# Patient Record
Sex: Male | Born: 1970 | Race: White | Hispanic: No | Marital: Married | Smoking: Never smoker
Health system: Southern US, Community
[De-identification: ages and names within clinical notes are randomized; demographics above are authoritative.]

## PROBLEM LIST (undated history)

## (undated) DIAGNOSIS — E039 Hypothyroidism, unspecified: Secondary | ICD-10-CM

## (undated) DIAGNOSIS — K219 Gastro-esophageal reflux disease without esophagitis: Secondary | ICD-10-CM

## (undated) DIAGNOSIS — F319 Bipolar disorder, unspecified: Secondary | ICD-10-CM

## (undated) DIAGNOSIS — F419 Anxiety disorder, unspecified: Secondary | ICD-10-CM

## (undated) DIAGNOSIS — S0990XA Unspecified injury of head, initial encounter: Secondary | ICD-10-CM

## (undated) DIAGNOSIS — Z9884 Bariatric surgery status: Secondary | ICD-10-CM

## (undated) DIAGNOSIS — G4730 Sleep apnea, unspecified: Secondary | ICD-10-CM

## (undated) DIAGNOSIS — F32A Depression, unspecified: Secondary | ICD-10-CM

## (undated) DIAGNOSIS — F329 Major depressive disorder, single episode, unspecified: Secondary | ICD-10-CM

## (undated) DIAGNOSIS — G5600 Carpal tunnel syndrome, unspecified upper limb: Secondary | ICD-10-CM

## (undated) DIAGNOSIS — E119 Type 2 diabetes mellitus without complications: Secondary | ICD-10-CM

## (undated) HISTORY — PX: CHOLECYSTECTOMY: SHX55

## (undated) HISTORY — DX: Bariatric surgery status: Z98.84

## (undated) HISTORY — PX: COLONOSCOPY: SHX174

## (undated) HISTORY — DX: Unspecified injury of head, initial encounter: S09.90XA

## (undated) HISTORY — PX: ANAL FISSURE REPAIR: SHX2312

## (undated) HISTORY — PX: UPPER GASTROINTESTINAL ENDOSCOPY: SHX188

## (undated) HISTORY — PX: BARIATRIC SURGERY: SHX1103

---

## 1998-11-22 ENCOUNTER — Ambulatory Visit (HOSPITAL_COMMUNITY): Admission: RE | Admit: 1998-11-22 | Discharge: 1998-11-22 | Payer: Self-pay | Admitting: Gastroenterology

## 1999-01-25 ENCOUNTER — Ambulatory Visit (HOSPITAL_COMMUNITY): Admission: RE | Admit: 1999-01-25 | Discharge: 1999-01-25 | Payer: Self-pay | Admitting: General Surgery

## 1999-05-04 ENCOUNTER — Emergency Department (HOSPITAL_COMMUNITY): Admission: EM | Admit: 1999-05-04 | Discharge: 1999-05-05 | Payer: Self-pay | Admitting: Emergency Medicine

## 1999-05-14 ENCOUNTER — Encounter: Admission: RE | Admit: 1999-05-14 | Discharge: 1999-05-14 | Payer: Self-pay | Admitting: *Deleted

## 2008-04-28 ENCOUNTER — Emergency Department (HOSPITAL_COMMUNITY): Admission: EM | Admit: 2008-04-28 | Discharge: 2008-04-29 | Payer: Self-pay | Admitting: Emergency Medicine

## 2008-04-29 ENCOUNTER — Emergency Department (HOSPITAL_COMMUNITY): Admission: EM | Admit: 2008-04-29 | Discharge: 2008-04-30 | Payer: Self-pay | Admitting: Emergency Medicine

## 2008-04-29 ENCOUNTER — Ambulatory Visit (HOSPITAL_COMMUNITY): Admission: RE | Admit: 2008-04-29 | Discharge: 2008-04-29 | Payer: Self-pay | Admitting: Neurology

## 2014-12-16 DIAGNOSIS — Z9884 Bariatric surgery status: Secondary | ICD-10-CM

## 2014-12-16 HISTORY — DX: Bariatric surgery status: Z98.84

## 2015-04-24 ENCOUNTER — Other Ambulatory Visit: Payer: Self-pay | Admitting: Orthopedic Surgery

## 2015-05-05 ENCOUNTER — Encounter (HOSPITAL_BASED_OUTPATIENT_CLINIC_OR_DEPARTMENT_OTHER): Payer: Self-pay | Admitting: *Deleted

## 2015-05-05 NOTE — Progress Notes (Signed)
Patient will come in for BMET and EKG, also anesthesia consult due to BMI 48. Will bring CPAP machine dos

## 2015-05-08 ENCOUNTER — Encounter (HOSPITAL_BASED_OUTPATIENT_CLINIC_OR_DEPARTMENT_OTHER)
Admission: RE | Admit: 2015-05-08 | Discharge: 2015-05-08 | Disposition: A | Discharge status: Home / Self Care | Payer: 59 | Source: Ambulatory Visit | Attending: Orthopedic Surgery | Admitting: Orthopedic Surgery

## 2015-05-08 DIAGNOSIS — F419 Anxiety disorder, unspecified: Secondary | ICD-10-CM | POA: Diagnosis not present

## 2015-05-08 DIAGNOSIS — F319 Bipolar disorder, unspecified: Secondary | ICD-10-CM | POA: Diagnosis not present

## 2015-05-08 DIAGNOSIS — G4730 Sleep apnea, unspecified: Secondary | ICD-10-CM | POA: Diagnosis not present

## 2015-05-08 DIAGNOSIS — Z79899 Other long term (current) drug therapy: Secondary | ICD-10-CM | POA: Diagnosis not present

## 2015-05-08 DIAGNOSIS — G5602 Carpal tunnel syndrome, left upper limb: Secondary | ICD-10-CM | POA: Diagnosis not present

## 2015-05-08 DIAGNOSIS — M549 Dorsalgia, unspecified: Secondary | ICD-10-CM | POA: Diagnosis not present

## 2015-05-08 DIAGNOSIS — K219 Gastro-esophageal reflux disease without esophagitis: Secondary | ICD-10-CM | POA: Diagnosis not present

## 2015-05-08 DIAGNOSIS — M79641 Pain in right hand: Secondary | ICD-10-CM | POA: Diagnosis present

## 2015-05-08 DIAGNOSIS — E039 Hypothyroidism, unspecified: Secondary | ICD-10-CM | POA: Diagnosis not present

## 2015-05-08 DIAGNOSIS — Z791 Long term (current) use of non-steroidal anti-inflammatories (NSAID): Secondary | ICD-10-CM | POA: Diagnosis not present

## 2015-05-08 DIAGNOSIS — E119 Type 2 diabetes mellitus without complications: Secondary | ICD-10-CM | POA: Diagnosis not present

## 2015-05-08 DIAGNOSIS — M79642 Pain in left hand: Secondary | ICD-10-CM | POA: Diagnosis present

## 2015-05-08 DIAGNOSIS — M109 Gout, unspecified: Secondary | ICD-10-CM | POA: Diagnosis not present

## 2015-05-08 DIAGNOSIS — G5601 Carpal tunnel syndrome, right upper limb: Secondary | ICD-10-CM | POA: Diagnosis not present

## 2015-05-08 DIAGNOSIS — Z9989 Dependence on other enabling machines and devices: Secondary | ICD-10-CM | POA: Diagnosis not present

## 2015-05-08 DIAGNOSIS — Z6841 Body Mass Index (BMI) 40.0 and over, adult: Secondary | ICD-10-CM | POA: Diagnosis not present

## 2015-05-08 DIAGNOSIS — Z794 Long term (current) use of insulin: Secondary | ICD-10-CM | POA: Diagnosis not present

## 2015-05-08 DIAGNOSIS — R200 Anesthesia of skin: Secondary | ICD-10-CM | POA: Diagnosis present

## 2015-05-08 LAB — BASIC METABOLIC PANEL
Anion gap: 8 (ref 5–15)
BUN: 15 mg/dL (ref 6–20)
CO2: 30 mmol/L (ref 22–32)
Calcium: 9.3 mg/dL (ref 8.9–10.3)
Chloride: 102 mmol/L (ref 101–111)
Creatinine, Ser: 0.89 mg/dL (ref 0.61–1.24)
GFR calc Af Amer: >60 mL/min (ref >60)
GFR calc non Af Amer: >60 mL/min (ref >60)
Glucose, Bld: 145 mg/dL — ABNORMAL HIGH (ref 65–99)
Potassium: 4.6 mmol/L (ref 3.5–5.1)
Sodium: 140 mmol/L (ref 135–145)

## 2015-05-08 NOTE — Progress Notes (Signed)
BMP and EKG done, Pt seen by Dr. Oletta Lamas for anesthesia consult.  [place] for surgery tomorrow.

## 2015-05-09 ENCOUNTER — Encounter (HOSPITAL_BASED_OUTPATIENT_CLINIC_OR_DEPARTMENT_OTHER): Payer: Self-pay | Admitting: Certified Registered"

## 2015-05-09 ENCOUNTER — Ambulatory Visit (HOSPITAL_BASED_OUTPATIENT_CLINIC_OR_DEPARTMENT_OTHER)
Admission: RE | Admit: 2015-05-09 | Discharge: 2015-05-09 | Disposition: A | Discharge status: Home / Self Care | Payer: 59 | Source: Ambulatory Visit | Attending: Orthopedic Surgery | Admitting: Orthopedic Surgery

## 2015-05-09 ENCOUNTER — Ambulatory Visit (HOSPITAL_BASED_OUTPATIENT_CLINIC_OR_DEPARTMENT_OTHER): Payer: 59 | Admitting: Certified Registered"

## 2015-05-09 ENCOUNTER — Encounter (HOSPITAL_BASED_OUTPATIENT_CLINIC_OR_DEPARTMENT_OTHER)
Admission: RE | Disposition: A | Discharge status: Home / Self Care | Payer: Self-pay | Source: Ambulatory Visit | Attending: Orthopedic Surgery

## 2015-05-09 DIAGNOSIS — F319 Bipolar disorder, unspecified: Secondary | ICD-10-CM | POA: Insufficient documentation

## 2015-05-09 DIAGNOSIS — G5601 Carpal tunnel syndrome, right upper limb: Secondary | ICD-10-CM | POA: Insufficient documentation

## 2015-05-09 DIAGNOSIS — Z9989 Dependence on other enabling machines and devices: Secondary | ICD-10-CM | POA: Insufficient documentation

## 2015-05-09 DIAGNOSIS — Z794 Long term (current) use of insulin: Secondary | ICD-10-CM | POA: Insufficient documentation

## 2015-05-09 DIAGNOSIS — G4730 Sleep apnea, unspecified: Secondary | ICD-10-CM | POA: Insufficient documentation

## 2015-05-09 DIAGNOSIS — Z6841 Body Mass Index (BMI) 40.0 and over, adult: Secondary | ICD-10-CM | POA: Insufficient documentation

## 2015-05-09 DIAGNOSIS — F419 Anxiety disorder, unspecified: Secondary | ICD-10-CM | POA: Insufficient documentation

## 2015-05-09 DIAGNOSIS — G5602 Carpal tunnel syndrome, left upper limb: Secondary | ICD-10-CM | POA: Insufficient documentation

## 2015-05-09 DIAGNOSIS — E119 Type 2 diabetes mellitus without complications: Secondary | ICD-10-CM | POA: Insufficient documentation

## 2015-05-09 DIAGNOSIS — Z791 Long term (current) use of non-steroidal anti-inflammatories (NSAID): Secondary | ICD-10-CM | POA: Insufficient documentation

## 2015-05-09 DIAGNOSIS — M549 Dorsalgia, unspecified: Secondary | ICD-10-CM | POA: Insufficient documentation

## 2015-05-09 DIAGNOSIS — K219 Gastro-esophageal reflux disease without esophagitis: Secondary | ICD-10-CM | POA: Insufficient documentation

## 2015-05-09 DIAGNOSIS — Z79899 Other long term (current) drug therapy: Secondary | ICD-10-CM | POA: Insufficient documentation

## 2015-05-09 DIAGNOSIS — E039 Hypothyroidism, unspecified: Secondary | ICD-10-CM | POA: Insufficient documentation

## 2015-05-09 DIAGNOSIS — M109 Gout, unspecified: Secondary | ICD-10-CM | POA: Insufficient documentation

## 2015-05-09 HISTORY — DX: Anxiety disorder, unspecified: F41.9

## 2015-05-09 HISTORY — DX: Hypothyroidism, unspecified: E03.9

## 2015-05-09 HISTORY — PX: CARPAL TUNNEL RELEASE: SHX101

## 2015-05-09 HISTORY — DX: Bipolar disorder, unspecified: F31.9

## 2015-05-09 HISTORY — DX: Depression, unspecified: F32.A

## 2015-05-09 HISTORY — DX: Sleep apnea, unspecified: G47.30

## 2015-05-09 HISTORY — DX: Carpal tunnel syndrome, unspecified upper limb: G56.00

## 2015-05-09 HISTORY — DX: Major depressive disorder, single episode, unspecified: F32.9

## 2015-05-09 HISTORY — DX: Gastro-esophageal reflux disease without esophagitis: K21.9

## 2015-05-09 LAB — GLUCOSE, CAPILLARY
Glucose-Capillary: 118 mg/dL — ABNORMAL HIGH (ref 65–99)
Glucose-Capillary: 119 mg/dL — ABNORMAL HIGH (ref 65–99)

## 2015-05-09 LAB — POCT HEMOGLOBIN-HEMACUE: Hemoglobin: 13.6 g/dL (ref 13.0–17.0)

## 2015-05-09 SURGERY — CARPAL TUNNEL RELEASE
Anesthesia: Monitor Anesthesia Care | Site: Wrist | Laterality: Left

## 2015-05-09 MED ORDER — DEXTROSE 5 % IV SOLN
3.0000 g | INTRAVENOUS | Status: AC
  Administered 2015-05-09: 3 g via INTRAVENOUS

## 2015-05-09 MED ORDER — LACTATED RINGERS IV SOLN
INTRAVENOUS | Status: DC
  Administered 2015-05-09: 08:00:00 via INTRAVENOUS

## 2015-05-09 MED ORDER — CEFAZOLIN SODIUM-DEXTROSE 2-3 GM-% IV SOLR: 2.0000 g | INTRAVENOUS | Status: DC

## 2015-05-09 MED ORDER — OXYCODONE HCL 5 MG PO TABS
ORAL_TABLET | ORAL | Status: AC
  Filled 2015-05-09: qty 1

## 2015-05-09 MED ORDER — OXYCODONE HCL 5 MG PO TABS
5.0000 mg | ORAL_TABLET | Freq: Once | ORAL | Status: AC
Start: 2015-05-09 — End: 2015-05-09
  Administered 2015-05-09: 5 mg via ORAL

## 2015-05-09 MED ORDER — CHLORHEXIDINE GLUCONATE 4 % EX LIQD: 60.0000 mL | Freq: Once | CUTANEOUS | Status: DC

## 2015-05-09 MED ORDER — HYDROCODONE-ACETAMINOPHEN 5-325 MG PO TABS
1.0000 | ORAL_TABLET | Freq: Four times a day (QID) | ORAL | Status: DC | PRN
Start: 2015-05-09 — End: 2016-11-19

## 2015-05-09 MED ORDER — BUPIVACAINE HCL (PF) 0.25 % IJ SOLN
INTRAMUSCULAR | Status: DC | PRN
  Administered 2015-05-09: 8 mL

## 2015-05-09 MED ORDER — MIDAZOLAM HCL 2 MG/2ML IJ SOLN
INTRAMUSCULAR | Status: AC
  Filled 2015-05-09: qty 2

## 2015-05-09 MED ORDER — FENTANYL CITRATE (PF) 100 MCG/2ML IJ SOLN: 50.0000 ug | INTRAMUSCULAR | Status: DC | PRN

## 2015-05-09 MED ORDER — CEFAZOLIN SODIUM-DEXTROSE 2-3 GM-% IV SOLR
INTRAVENOUS | Status: AC
Start: 2015-05-09 — End: 2015-05-09
  Filled 2015-05-09: qty 50

## 2015-05-09 MED ORDER — MIDAZOLAM HCL 2 MG/2ML IJ SOLN
1.0000 mg | INTRAMUSCULAR | Status: DC | PRN
  Administered 2015-05-09: 2 mg via INTRAVENOUS

## 2015-05-09 MED ORDER — GLYCOPYRROLATE 0.2 MG/ML IJ SOLN: 0.2000 mg | Freq: Once | INTRAMUSCULAR | Status: DC | PRN

## 2015-05-09 MED ORDER — PROMETHAZINE HCL 25 MG/ML IJ SOLN: 6.2500 mg | INTRAMUSCULAR | Status: DC | PRN

## 2015-05-09 MED ORDER — BUPIVACAINE HCL (PF) 0.25 % IJ SOLN
INTRAMUSCULAR | Status: AC
  Filled 2015-05-09: qty 30

## 2015-05-09 MED ORDER — FENTANYL CITRATE (PF) 100 MCG/2ML IJ SOLN: 25.0000 ug | INTRAMUSCULAR | Status: DC | PRN

## 2015-05-09 MED ORDER — FENTANYL CITRATE (PF) 100 MCG/2ML IJ SOLN
INTRAMUSCULAR | Status: AC
  Filled 2015-05-09: qty 2

## 2015-05-09 MED ORDER — ONDANSETRON HCL 4 MG/2ML IJ SOLN
INTRAMUSCULAR | Status: DC | PRN
  Administered 2015-05-09: 4 mg via INTRAVENOUS

## 2015-05-09 MED ORDER — LIDOCAINE HCL (PF) 0.5 % IJ SOLN
INTRAMUSCULAR | Status: DC | PRN
  Administered 2015-05-09: 30 mL via INTRAVENOUS

## 2015-05-09 MED ORDER — PROPOFOL 500 MG/50ML IV EMUL
INTRAVENOUS | Status: AC
  Filled 2015-05-09: qty 50

## 2015-05-09 MED ORDER — FENTANYL CITRATE (PF) 100 MCG/2ML IJ SOLN
INTRAMUSCULAR | Status: DC | PRN
  Administered 2015-05-09 (×2): 50 ug via INTRAVENOUS

## 2015-05-09 MED ORDER — PROPOFOL INFUSION 10 MG/ML OPTIME
INTRAVENOUS | Status: DC | PRN
  Administered 2015-05-09: 100 ug/kg/min via INTRAVENOUS

## 2015-05-09 SURGICAL SUPPLY — 34 items
BLADE SURG 15 STRL LF DISP TIS (BLADE) ×1 IMPLANT
BLADE SURG 15 STRL SS (BLADE) ×2
BNDG CMPR 9X4 STRL LF SNTH (GAUZE/BANDAGES/DRESSINGS)
BNDG COHESIVE 3X5 TAN STRL LF (GAUZE/BANDAGES/DRESSINGS) ×2 IMPLANT
BNDG ESMARK 4X9 LF (GAUZE/BANDAGES/DRESSINGS) IMPLANT
BNDG GAUZE ELAST 4 BULKY (GAUZE/BANDAGES/DRESSINGS) ×2 IMPLANT
CHLORAPREP W/TINT 26ML (MISCELLANEOUS) ×2 IMPLANT
CORDS BIPOLAR (ELECTRODE) ×2 IMPLANT
COVER BACK TABLE 60X90IN (DRAPES) ×2 IMPLANT
COVER MAYO STAND STRL (DRAPES) ×2 IMPLANT
CUFF TOURNIQUET SINGLE 18IN (TOURNIQUET CUFF) ×2 IMPLANT
DRAPE EXTREMITY T 121X128X90 (DRAPE) ×2 IMPLANT
DRAPE SURG 17X23 STRL (DRAPES) ×2 IMPLANT
DRSG PAD ABDOMINAL 8X10 ST (GAUZE/BANDAGES/DRESSINGS) ×2 IMPLANT
GAUZE SPONGE 4X4 12PLY STRL (GAUZE/BANDAGES/DRESSINGS) ×2 IMPLANT
GAUZE XEROFORM 1X8 LF (GAUZE/BANDAGES/DRESSINGS) ×2 IMPLANT
GLOVE BIOGEL PI IND STRL 8.5 (GLOVE) ×1 IMPLANT
GLOVE BIOGEL PI INDICATOR 8.5 (GLOVE) ×3
GLOVE ECLIPSE 6.5 STRL STRAW (GLOVE) ×1 IMPLANT
GLOVE SURG ORTHO 8.0 STRL STRW (GLOVE) ×2 IMPLANT
GOWN STRL REUS W/ TWL LRG LVL3 (GOWN DISPOSABLE) ×1 IMPLANT
GOWN STRL REUS W/TWL LRG LVL3 (GOWN DISPOSABLE) ×2
GOWN STRL REUS W/TWL XL LVL3 (GOWN DISPOSABLE) ×2 IMPLANT
NDL PRECISIONGLIDE 27X1.5 (NEEDLE) IMPLANT
NEEDLE PRECISIONGLIDE 27X1.5 (NEEDLE) IMPLANT
NS IRRIG 1000ML POUR BTL (IV SOLUTION) ×2 IMPLANT
PACK BASIN DAY SURGERY FS (CUSTOM PROCEDURE TRAY) ×2 IMPLANT
STOCKINETTE 4X48 STRL (DRAPES) ×2 IMPLANT
SUT ETHILON 4 0 PS 2 18 (SUTURE) ×2 IMPLANT
SUT VICRYL 4-0 PS2 18IN ABS (SUTURE) IMPLANT
SYR BULB 3OZ (MISCELLANEOUS) ×2 IMPLANT
SYR CONTROL 10ML LL (SYRINGE) IMPLANT
TOWEL OR 17X24 6PK STRL BLUE (TOWEL DISPOSABLE) ×2 IMPLANT
UNDERPAD 30X30 (UNDERPADS AND DIAPERS) ×2 IMPLANT
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
## 2015-05-09 NOTE — Anesthesia Postprocedure Evaluation (Signed)
  Anesthesia Post-op Note  Patient: Marvin Espinoza  Procedure(s) Performed: Procedure(s) (LRB): LEFT CARPAL TUNNEL RELEASE (Left)  Patient Location: PACU  Anesthesia Type: Bier block  Level of Consciousness: awake and alert   Airway and Oxygen Therapy: Patient Spontanous Breathing  Post-op Pain: mild  Post-op Assessment: Post-op Vital signs reviewed, Patient's Cardiovascular Status Stable, Respiratory Function Stable, Patent Airway and No signs of Nausea or vomiting  Last Vitals:  Filed Vitals:   05/09/15 0930  BP: 129/69  Pulse: 73  Temp:   Resp: 17    Post-op Vital Signs: stable   Complications: No apparent anesthesia complications

## 2015-05-09 NOTE — Discharge Instructions (Signed)
°  Post Anesthesia Home Care Instructions ° °Activity: °Get plenty of rest for the remainder of the day. A responsible adult should stay with you for 24 hours following the procedure.  °For the next 24 hours, DO NOT: °-Drive a car °-Operate machinery °-Drink alcoholic beverages °-Take any medication unless instructed by your physician °-Make any legal decisions or sign important papers. ° °Meals: °Start with liquid foods such as gelatin or soup. Progress to regular foods as tolerated. Avoid greasy, spicy, heavy foods. If nausea and/or vomiting occur, drink only clear liquids until the nausea and/or vomiting subsides. Call your physician if vomiting continues. ° °Special Instructions/Symptoms: °Your throat may feel dry or sore from the anesthesia or the breathing tube placed in your throat during surgery. If this causes discomfort, gargle with warm salt water. The discomfort should disappear within 24 hours. ° °If you had a scopolamine patch placed behind your ear for the management of post- operative nausea and/or vomiting: ° °1. The medication in the patch is effective for 72 hours, after which it should be removed.  Wrap patch in a tissue and discard in the trash. Wash hands thoroughly with soap and water. °2. You may remove the patch earlier than 72 hours if you experience unpleasant side effects which may include dry mouth, dizziness or visual disturbances. °3. Avoid touching the patch. Wash your hands with soap and water after contact with the patch. °  ° ° ° ° °[facility] Instructions °Hand Surgery ° °Wound Care: °Keep your hand elevated above the level of your heart.  Do not allow it to dangle by your side.  Keep the dressing dry and do not remove it unless your doctor advises you to do so.  He will usually change it at the time of your post-op visit.  Moving your fingers is advised to stimulate circulation but will depend on the site of your surgery.  If you have a splint applied, your doctor will advise you  regarding movement. ° °Activity: °Do not drive or operate machinery today.  Rest today and then you may return to your normal activity and work as indicated by your physician. ° °Diet:  °Drink liquids today or eat a light diet.  You may resume a regular diet tomorrow.   ° °General expectations: °Pain for two to three days. °Fingers may become slightly swollen. ° °Call your doctor if any of the following occur: °Severe pain not relieved by pain medication. °Elevated temperature. °Dressing soaked with blood. °Inability to move fingers. °White or bluish color to fingers. ° °

## 2015-05-09 NOTE — Op Note (Signed)
Dictation Number [id number]

## 2015-05-09 NOTE — Brief Op Note (Signed)
05/09/2015  9:11 AM  PATIENT:  Marvin Espinoza  44 y.o. male  PRE-OPERATIVE DIAGNOSIS:  LEFT CARPAL TUNNEL SYNDROME   POST-OPERATIVE DIAGNOSIS:  left carpal tunnel syndrome  PROCEDURE:  Procedure(s): LEFT CARPAL TUNNEL RELEASE (Left)  SURGEON:  Surgeon(s) and Role:    * Daryll Brod, MD - Primary  PHYSICIAN ASSISTANT:   ASSISTANTS: none   ANESTHESIA:   local and regional  EBL:  Total I/O In: 800 [I.V.:800] Out: -   BLOOD ADMINISTERED:none  DRAINS: none   LOCAL MEDICATIONS USED:  BUPIVICAINE   SPECIMEN:  No Specimen  DISPOSITION OF SPECIMEN:  N/A  COUNTS:  YES  TOURNIQUET:   Total Tourniquet Time Documented: Forearm (Left) - 22 minutes Total: Forearm (Left) - 22 minutes   DICTATION: .Other Dictation: Dictation Number [id number]  PLAN OF CARE: Discharge to home after PACU  PATIENT DISPOSITION:  PACU - hemodynamically stable.

## 2015-05-09 NOTE — Anesthesia Preprocedure Evaluation (Addendum)
Anesthesia Evaluation  Patient identified by MRN, date of birth, ID band Patient awake    Reviewed: Allergy & Precautions, NPO status , Patient's Chart, lab work & pertinent test results  Airway Mallampati: II  TM Distance: <3 FB Neck ROM: Full    Dental no notable dental hx.    Pulmonary sleep apnea ,  breath sounds clear to auscultation  + decreased breath sounds      Cardiovascular negative cardio ROS Normal cardiovascular examRhythm:Regular Rate:Normal     Neuro/Psych PSYCHIATRIC DISORDERS Anxiety Depression negative neurological ROS     GI/Hepatic Neg liver ROS, GERD-  Medicated,  Endo/Other  Hypothyroidism Morbid obesity  Renal/GU negative Renal ROS  negative genitourinary   Musculoskeletal negative musculoskeletal ROS (+)   Abdominal   Peds negative pediatric ROS (+)  Hematology negative hematology ROS (+)   Anesthesia Other Findings   Reproductive/Obstetrics negative OB ROS                            Anesthesia Physical Anesthesia Plan  ASA: III  Anesthesia Plan: MAC and Regional   Post-op Pain Management:    Induction: Intravenous  Airway Management Planned: Simple Face Mask  Additional Equipment:   Intra-op Plan:   Post-operative Plan:   Informed Consent: I have reviewed the patients History and Physical, chart, labs and discussed the procedure including the risks, benefits and alternatives for the proposed anesthesia with the patient or authorized representative who has indicated his/her understanding and acceptance.   Dental advisory given  Plan Discussed with: CRNA and Surgeon  Anesthesia Plan Comments:         Anesthesia Quick Evaluation

## 2015-05-09 NOTE — Op Note (Signed)
Marvin Espinoza, GAIR                 ACCOUNT NO.:  [id number]  MEDICAL RECORD NO.:  [id number]  LOCATION:                                FACILITY:  [facility]  PHYSICIAN:  Daryll Brod, M.D.       DATE OF BIRTH:  1971-01-08  DATE OF PROCEDURE:  05/09/2015 DATE OF DISCHARGE:  05/09/2015                              OPERATIVE REPORT   PREOPERATIVE NOTE:  Carpal tunnel syndrome, left hand.  POSTOPERATIVE DIAGNOSIS:  Carpal tunnel syndrome, left hand.  OPERATION:  Decompression of left median nerve.  SURGEON:  Daryll Brod, MD.  ANESTHESIA:  Forearm-based IV regional with local infiltration.  ANESTHESIOLOGIST:  Lorrene Reid, M.D.  HISTORY:  The patient is a 45 year old male with a history of carpal tunnel syndrome, EMG nerve conduction is positive, this has not responded to conservative treatment.  Pre, peri, and postoperative course have been discussed along with risks and complications.  He is aware that there is no guarantee with the surgery, possibility of infection, recurrence of injury to arteries, nerves, tendons, incomplete relief of symptoms, dystrophy.  In the preoperative area, the patient was seen, the extremity marked by both patient and surgeon.  Antibiotic given.  PROCEDURE IN DETAIL:  The patient was brought to the operating room, where a forearm-based IV regional anesthetic was carried out without difficulty.  He was prepped using ChloraPrep, supine position with the left arm free.  A 3-minute dry time was allowed.  Time-out taken, confirming the patient and procedure.  A longitudinal incision was made in the left palm, carried down through subcutaneous tissue.  Bleeders were  electrocauterized.  He had some feeling.  Local infiltration with 0.25% bupivacaine without epinephrine was given, 9 mL was used.  The palmar fascia was split.  Superficial palmar arch identified.  The flexor tendon to the ring and little finger identified to the ulnar side of the median nerve.   Carpal retinaculum was incised with sharp dissection.  Right angle and Sewall retractor were placed between skin and forearm fascia.  The fascia was released for approximately 2 cm proximal to the wrist crease under direct vision.  Canal was explored. Air compression to the nerve was apparent.  Motor branch entered into muscle on the radial side.  Wound was copiously irrigated with saline. The skin then closed with interrupted 4-0 nylon sutures.  Sterile compressive dressing with the fingers free was applied.  On deflation of the tourniquet, all fingers immediately pinked.  He was taken to the recovery room for observation in satisfactory condition.  He will be discharged home to return to the [facility] in 1 week on Vicodin.          ______________________________ Daryll Brod, M.D.     GK/MEDQ  D:  05/09/2015  T:  05/09/2015  Job:  [id number]

## 2015-05-09 NOTE — Transfer of Care (Signed)
Immediate Anesthesia Transfer of Care Note  Patient: Marvin Espinoza  Procedure(s) Performed: Procedure(s): LEFT CARPAL TUNNEL RELEASE (Left)  Patient Location: PACU  Anesthesia Type:Bier block  Level of Consciousness: awake, alert , oriented and patient cooperative  Airway & Oxygen Therapy: Patient Spontanous Breathing  Post-op Assessment: Report given to RN and Post -op Vital signs reviewed and stable  Post vital signs: Reviewed and stable  Last Vitals:  Filed Vitals:   05/09/15 0714  BP: 147/88  Pulse: 82  Temp: 36.9 C  Resp: 20    Complications: No apparent anesthesia complications

## 2015-05-09 NOTE — Anesthesia Procedure Notes (Signed)
Procedure Name: MAC Date/Time: 05/09/2015 8:30 AM Performed by: Jazzlene Huot D Pre-anesthesia Checklist: Patient identified, Emergency Drugs available, Suction available, Patient being monitored and Timeout performed Patient Re-evaluated:Patient Re-evaluated prior to inductionOxygen Delivery Method: Simple face mask

## 2015-05-10 ENCOUNTER — Encounter (HOSPITAL_BASED_OUTPATIENT_CLINIC_OR_DEPARTMENT_OTHER): Payer: Self-pay | Admitting: Orthopedic Surgery

## 2015-06-06 ENCOUNTER — Other Ambulatory Visit: Payer: Self-pay | Admitting: Orthopedic Surgery

## 2015-06-13 ENCOUNTER — Encounter (HOSPITAL_BASED_OUTPATIENT_CLINIC_OR_DEPARTMENT_OTHER): Payer: Self-pay | Admitting: *Deleted

## 2015-06-14 ENCOUNTER — Encounter (HOSPITAL_BASED_OUTPATIENT_CLINIC_OR_DEPARTMENT_OTHER): Payer: Self-pay | Admitting: Orthopedic Surgery

## 2015-06-16 ENCOUNTER — Encounter (HOSPITAL_BASED_OUTPATIENT_CLINIC_OR_DEPARTMENT_OTHER)
Admission: RE | Admit: 2015-06-16 | Discharge: 2015-06-16 | Disposition: A | Discharge status: Home / Self Care | Payer: 59 | Source: Ambulatory Visit | Attending: Orthopedic Surgery | Admitting: Orthopedic Surgery

## 2015-06-16 DIAGNOSIS — F419 Anxiety disorder, unspecified: Secondary | ICD-10-CM | POA: Diagnosis not present

## 2015-06-16 DIAGNOSIS — M79641 Pain in right hand: Secondary | ICD-10-CM | POA: Diagnosis present

## 2015-06-16 DIAGNOSIS — M109 Gout, unspecified: Secondary | ICD-10-CM | POA: Diagnosis not present

## 2015-06-16 DIAGNOSIS — M79642 Pain in left hand: Secondary | ICD-10-CM | POA: Diagnosis present

## 2015-06-16 DIAGNOSIS — Z79891 Long term (current) use of opiate analgesic: Secondary | ICD-10-CM | POA: Diagnosis not present

## 2015-06-16 DIAGNOSIS — G5601 Carpal tunnel syndrome, right upper limb: Secondary | ICD-10-CM | POA: Diagnosis not present

## 2015-06-16 DIAGNOSIS — G5602 Carpal tunnel syndrome, left upper limb: Secondary | ICD-10-CM | POA: Diagnosis not present

## 2015-06-16 DIAGNOSIS — Z79899 Other long term (current) drug therapy: Secondary | ICD-10-CM | POA: Diagnosis not present

## 2015-06-16 DIAGNOSIS — F319 Bipolar disorder, unspecified: Secondary | ICD-10-CM | POA: Diagnosis not present

## 2015-06-16 DIAGNOSIS — Z794 Long term (current) use of insulin: Secondary | ICD-10-CM | POA: Diagnosis not present

## 2015-06-16 DIAGNOSIS — Z9989 Dependence on other enabling machines and devices: Secondary | ICD-10-CM | POA: Diagnosis not present

## 2015-06-16 DIAGNOSIS — E119 Type 2 diabetes mellitus without complications: Secondary | ICD-10-CM | POA: Diagnosis not present

## 2015-06-16 DIAGNOSIS — R200 Anesthesia of skin: Secondary | ICD-10-CM | POA: Diagnosis present

## 2015-06-16 DIAGNOSIS — E039 Hypothyroidism, unspecified: Secondary | ICD-10-CM | POA: Diagnosis not present

## 2015-06-16 DIAGNOSIS — K219 Gastro-esophageal reflux disease without esophagitis: Secondary | ICD-10-CM | POA: Diagnosis not present

## 2015-06-16 DIAGNOSIS — G4730 Sleep apnea, unspecified: Secondary | ICD-10-CM | POA: Diagnosis not present

## 2015-06-16 LAB — BASIC METABOLIC PANEL
Anion gap: 8 (ref 5–15)
BUN: 15 mg/dL (ref 6–20)
CO2: 29 mmol/L (ref 22–32)
Calcium: 9.8 mg/dL (ref 8.9–10.3)
Chloride: 104 mmol/L (ref 101–111)
Creatinine, Ser: 0.84 mg/dL (ref 0.61–1.24)
GFR calc Af Amer: >60 mL/min (ref >60)
GFR calc non Af Amer: >60 mL/min (ref >60)
Glucose, Bld: 123 mg/dL — ABNORMAL HIGH (ref 65–99)
Potassium: 4.6 mmol/L (ref 3.5–5.1)
Sodium: 141 mmol/L (ref 135–145)

## 2015-06-20 ENCOUNTER — Encounter (HOSPITAL_BASED_OUTPATIENT_CLINIC_OR_DEPARTMENT_OTHER)
Admission: RE | Disposition: A | Discharge status: Home / Self Care | Payer: Self-pay | Source: Ambulatory Visit | Attending: Orthopedic Surgery

## 2015-06-20 ENCOUNTER — Encounter (HOSPITAL_BASED_OUTPATIENT_CLINIC_OR_DEPARTMENT_OTHER): Payer: Self-pay | Admitting: Certified Registered"

## 2015-06-20 ENCOUNTER — Ambulatory Visit (HOSPITAL_BASED_OUTPATIENT_CLINIC_OR_DEPARTMENT_OTHER): Payer: 59 | Admitting: Certified Registered"

## 2015-06-20 ENCOUNTER — Ambulatory Visit (HOSPITAL_BASED_OUTPATIENT_CLINIC_OR_DEPARTMENT_OTHER)
Admission: RE | Admit: 2015-06-20 | Discharge: 2015-06-20 | Disposition: A | Discharge status: Home / Self Care | Payer: 59 | Source: Ambulatory Visit | Attending: Orthopedic Surgery | Admitting: Orthopedic Surgery

## 2015-06-20 DIAGNOSIS — Z79891 Long term (current) use of opiate analgesic: Secondary | ICD-10-CM | POA: Insufficient documentation

## 2015-06-20 DIAGNOSIS — G5601 Carpal tunnel syndrome, right upper limb: Secondary | ICD-10-CM | POA: Insufficient documentation

## 2015-06-20 DIAGNOSIS — Z9989 Dependence on other enabling machines and devices: Secondary | ICD-10-CM | POA: Insufficient documentation

## 2015-06-20 DIAGNOSIS — G4730 Sleep apnea, unspecified: Secondary | ICD-10-CM | POA: Insufficient documentation

## 2015-06-20 DIAGNOSIS — Z794 Long term (current) use of insulin: Secondary | ICD-10-CM | POA: Insufficient documentation

## 2015-06-20 DIAGNOSIS — Z79899 Other long term (current) drug therapy: Secondary | ICD-10-CM | POA: Insufficient documentation

## 2015-06-20 DIAGNOSIS — E119 Type 2 diabetes mellitus without complications: Secondary | ICD-10-CM | POA: Insufficient documentation

## 2015-06-20 DIAGNOSIS — F419 Anxiety disorder, unspecified: Secondary | ICD-10-CM | POA: Insufficient documentation

## 2015-06-20 DIAGNOSIS — E039 Hypothyroidism, unspecified: Secondary | ICD-10-CM | POA: Insufficient documentation

## 2015-06-20 DIAGNOSIS — F319 Bipolar disorder, unspecified: Secondary | ICD-10-CM | POA: Insufficient documentation

## 2015-06-20 DIAGNOSIS — M109 Gout, unspecified: Secondary | ICD-10-CM | POA: Insufficient documentation

## 2015-06-20 DIAGNOSIS — G5602 Carpal tunnel syndrome, left upper limb: Secondary | ICD-10-CM | POA: Insufficient documentation

## 2015-06-20 DIAGNOSIS — K219 Gastro-esophageal reflux disease without esophagitis: Secondary | ICD-10-CM | POA: Insufficient documentation

## 2015-06-20 HISTORY — DX: Type 2 diabetes mellitus without complications: E11.9

## 2015-06-20 HISTORY — PX: CARPAL TUNNEL RELEASE: SHX101

## 2015-06-20 LAB — GLUCOSE, CAPILLARY
Glucose-Capillary: 84 mg/dL (ref 65–99)
Glucose-Capillary: 74 mg/dL (ref 65–99)

## 2015-06-20 LAB — POCT HEMOGLOBIN-HEMACUE: Hemoglobin: 13.2 g/dL (ref 13.0–17.0)

## 2015-06-20 SURGERY — CARPAL TUNNEL RELEASE
Anesthesia: Monitor Anesthesia Care | Site: Hand | Laterality: Right

## 2015-06-20 MED ORDER — HYDROCODONE-ACETAMINOPHEN 10-325 MG PO TABS: 1.0000 | ORAL_TABLET | Freq: Four times a day (QID) | ORAL | Status: DC | PRN

## 2015-06-20 MED ORDER — CEFAZOLIN SODIUM-DEXTROSE 2-3 GM-% IV SOLR: 2.0000 g | INTRAVENOUS | Status: DC

## 2015-06-20 MED ORDER — FENTANYL CITRATE (PF) 100 MCG/2ML IJ SOLN
INTRAMUSCULAR | Status: AC
  Filled 2015-06-20: qty 2

## 2015-06-20 MED ORDER — SCOPOLAMINE 1 MG/3DAYS TD PT72: 1.0000 | MEDICATED_PATCH | Freq: Once | TRANSDERMAL | Status: DC | PRN

## 2015-06-20 MED ORDER — CHLORHEXIDINE GLUCONATE 4 % EX LIQD: 60.0000 mL | Freq: Once | CUTANEOUS | Status: DC

## 2015-06-20 MED ORDER — BUPIVACAINE HCL (PF) 0.25 % IJ SOLN
INTRAMUSCULAR | Status: AC
  Filled 2015-06-20: qty 30

## 2015-06-20 MED ORDER — MIDAZOLAM HCL 2 MG/2ML IJ SOLN
1.0000 mg | INTRAMUSCULAR | Status: DC | PRN
  Administered 2015-06-20: 2 mg via INTRAVENOUS

## 2015-06-20 MED ORDER — FENTANYL CITRATE (PF) 100 MCG/2ML IJ SOLN
50.0000 ug | INTRAMUSCULAR | Status: DC | PRN
  Administered 2015-06-20: 50 ug via INTRAVENOUS

## 2015-06-20 MED ORDER — ONDANSETRON HCL 4 MG/2ML IJ SOLN
INTRAMUSCULAR | Status: DC | PRN
  Administered 2015-06-20: 4 mg via INTRAVENOUS

## 2015-06-20 MED ORDER — OXYCODONE HCL 5 MG/5ML PO SOLN: 5.0000 mg | Freq: Once | ORAL | Status: AC | PRN

## 2015-06-20 MED ORDER — MIDAZOLAM HCL 2 MG/2ML IJ SOLN
INTRAMUSCULAR | Status: AC
  Filled 2015-06-20: qty 2

## 2015-06-20 MED ORDER — LIDOCAINE HCL (PF) 1 % IJ SOLN
INTRAMUSCULAR | Status: AC
  Filled 2015-06-20: qty 30

## 2015-06-20 MED ORDER — BUPIVACAINE HCL (PF) 0.25 % IJ SOLN
INTRAMUSCULAR | Status: DC | PRN
  Administered 2015-06-20: 8 mL

## 2015-06-20 MED ORDER — LIDOCAINE HCL (PF) 0.5 % IJ SOLN
INTRAMUSCULAR | Status: DC | PRN
  Administered 2015-06-20: 30 mL via INTRAVENOUS

## 2015-06-20 MED ORDER — GLYCOPYRROLATE 0.2 MG/ML IJ SOLN: 0.2000 mg | Freq: Once | INTRAMUSCULAR | Status: DC | PRN

## 2015-06-20 MED ORDER — CEFAZOLIN SODIUM-DEXTROSE 2-3 GM-% IV SOLR
INTRAVENOUS | Status: AC
  Filled 2015-06-20: qty 50

## 2015-06-20 MED ORDER — PROPOFOL 500 MG/50ML IV EMUL
INTRAVENOUS | Status: AC
  Filled 2015-06-20: qty 50

## 2015-06-20 MED ORDER — OXYCODONE HCL 5 MG PO TABS
ORAL_TABLET | ORAL | Status: AC
  Filled 2015-06-20: qty 1

## 2015-06-20 MED ORDER — OXYCODONE HCL 5 MG PO TABS
5.0000 mg | ORAL_TABLET | Freq: Once | ORAL | Status: AC | PRN
  Administered 2015-06-20: 5 mg via ORAL

## 2015-06-20 MED ORDER — ONDANSETRON HCL 4 MG/2ML IJ SOLN: 4.0000 mg | Freq: Four times a day (QID) | INTRAMUSCULAR | Status: DC | PRN

## 2015-06-20 MED ORDER — FENTANYL CITRATE (PF) 100 MCG/2ML IJ SOLN: 25.0000 ug | INTRAMUSCULAR | Status: DC | PRN

## 2015-06-20 MED ORDER — LIDOCAINE HCL (CARDIAC) 20 MG/ML IV SOLN
INTRAVENOUS | Status: DC | PRN
  Administered 2015-06-20: 30 mg via INTRAVENOUS

## 2015-06-20 MED ORDER — PROPOFOL INFUSION 10 MG/ML OPTIME
INTRAVENOUS | Status: DC | PRN
  Administered 2015-06-20: 100 ug/kg/min via INTRAVENOUS

## 2015-06-20 MED ORDER — LACTATED RINGERS IV SOLN
INTRAVENOUS | Status: DC
  Administered 2015-06-20: 08:00:00 via INTRAVENOUS

## 2015-06-20 MED ORDER — CEFAZOLIN SODIUM 10 G IJ SOLR
3.0000 g | INTRAMUSCULAR | Status: AC
  Administered 2015-06-20: 3 g via INTRAVENOUS

## 2015-06-20 SURGICAL SUPPLY — 36 items
BLADE SURG 15 STRL LF DISP TIS (BLADE) ×1 IMPLANT
BLADE SURG 15 STRL SS (BLADE) ×2
BNDG CMPR 9X4 STRL LF SNTH (GAUZE/BANDAGES/DRESSINGS)
BNDG COHESIVE 3X5 TAN STRL LF (GAUZE/BANDAGES/DRESSINGS) ×2 IMPLANT
BNDG ESMARK 4X9 LF (GAUZE/BANDAGES/DRESSINGS) IMPLANT
BNDG GAUZE ELAST 4 BULKY (GAUZE/BANDAGES/DRESSINGS) ×2 IMPLANT
CHLORAPREP W/TINT 26ML (MISCELLANEOUS) ×2 IMPLANT
CORDS BIPOLAR (ELECTRODE) ×2 IMPLANT
COVER BACK TABLE 60X90IN (DRAPES) ×2 IMPLANT
COVER MAYO STAND STRL (DRAPES) ×2 IMPLANT
CUFF TOURNIQUET SINGLE 18IN (TOURNIQUET CUFF) ×2 IMPLANT
DRAPE EXTREMITY T 121X128X90 (DRAPE) ×2 IMPLANT
DRAPE SURG 17X23 STRL (DRAPES) ×2 IMPLANT
DRSG PAD ABDOMINAL 8X10 ST (GAUZE/BANDAGES/DRESSINGS) ×2 IMPLANT
GAUZE SPONGE 4X4 12PLY STRL (GAUZE/BANDAGES/DRESSINGS) ×2 IMPLANT
GAUZE XEROFORM 1X8 LF (GAUZE/BANDAGES/DRESSINGS) ×2 IMPLANT
GLOVE BIOGEL PI IND STRL 7.0 (GLOVE) IMPLANT
GLOVE BIOGEL PI IND STRL 8.5 (GLOVE) ×1 IMPLANT
GLOVE BIOGEL PI INDICATOR 7.0 (GLOVE) ×2
GLOVE BIOGEL PI INDICATOR 8.5 (GLOVE) ×1
GLOVE ECLIPSE 6.5 STRL STRAW (GLOVE) ×1 IMPLANT
GLOVE SURG ORTHO 8.0 STRL STRW (GLOVE) ×2 IMPLANT
GOWN STRL REUS W/ TWL LRG LVL3 (GOWN DISPOSABLE) ×1 IMPLANT
GOWN STRL REUS W/TWL LRG LVL3 (GOWN DISPOSABLE) ×2
GOWN STRL REUS W/TWL XL LVL3 (GOWN DISPOSABLE) ×2 IMPLANT
NDL PRECISIONGLIDE 27X1.5 (NEEDLE) IMPLANT
NEEDLE PRECISIONGLIDE 27X1.5 (NEEDLE) ×2 IMPLANT
NS IRRIG 1000ML POUR BTL (IV SOLUTION) ×2 IMPLANT
PACK BASIN DAY SURGERY FS (CUSTOM PROCEDURE TRAY) ×2 IMPLANT
STOCKINETTE 4X48 STRL (DRAPES) ×2 IMPLANT
SUT ETHILON 4 0 PS 2 18 (SUTURE) ×2 IMPLANT
SUT VICRYL 4-0 PS2 18IN ABS (SUTURE) IMPLANT
SYR BULB 3OZ (MISCELLANEOUS) ×2 IMPLANT
SYR CONTROL 10ML LL (SYRINGE) ×1 IMPLANT
TOWEL OR 17X24 6PK STRL BLUE (TOWEL DISPOSABLE) ×2 IMPLANT
UNDERPAD 30X30 (UNDERPADS AND DIAPERS) ×2 IMPLANT

## 2015-06-20 NOTE — Transfer of Care (Signed)
Immediate Anesthesia Transfer of Care Note  Patient: Marvin Espinoza  Procedure(s) Performed: Procedure(s) with comments: RIGHT CARPAL TUNNEL RELEASE (Right) - REGIONAL/FAB  Patient Location: PACU  Anesthesia Type:Bier block  Level of Consciousness: awake, alert , oriented and patient cooperative  Airway & Oxygen Therapy: Patient Spontanous Breathing and Patient connected to face mask oxygen  Post-op Assessment: Report given to RN and Post -op Vital signs reviewed and stable  Post vital signs: Reviewed and stable  Last Vitals:  Filed Vitals:   06/20/15 0724  BP: 125/61  Pulse: 69  Temp: 37 C  Resp: 20    Complications: No apparent anesthesia complications

## 2015-06-20 NOTE — Op Note (Signed)
NAMERogelio Seen NO.:  [id number]  MEDICAL RECORD NO.:  [id number]  LOCATION:                                 FACILITY:  PHYSICIAN:  Daryll Brod, M.D.            DATE OF BIRTH:  DATE OF PROCEDURE:  06/20/2015 DATE OF DISCHARGE:                              OPERATIVE REPORT   PREOPERATIVE DIAGNOSIS:  Carpal tunnel syndrome, right hand.  POSTOPERATIVE DIAGNOSIS:  Carpal tunnel syndrome, right hand.  OPERATION:  Decompression of right median nerve.  SURGEON:  Daryll Brod, MD.  ANESTHESIA:  Forearm-based IV regional with local infiltration.  HISTORY:  The patient is a 44 year old male with history of bilateral carpal tunnel syndrome.  Nerve conductions are positive.  This has not responded to conservative treatment.  He has undergone release of the median nerve on his left side.  He is admitted now for release to the right.  Pre, peri, and postoperative course have been discussed along with risks and complications.  He is aware that there is no guarantee with surgery with possibility of infection, recurrence, injury to arteries, nerves, tendons; incomplete relief of symptoms, dystrophy.  In the preoperative area, the patient is seen.  The extremity was marked by both the patient and surgeon.  Antibiotic was given.  PROCEDURE IN DETAIL:  The patient was brought to the operating room, where a forearm-based IV regional anesthetic was carried out without difficulty.  He was prepped using ChloraPrep in supine position with the right arm free.  A 3-minute dry time was allowed.  Time-out taken confirming the patient and procedure.  A longitudinal incision was made in the right palm, carried down through subcutaneous tissue.  Bleeders were electrocauterized.  Palmar fascia was split and separated.  The superficial palmar arch identified.  The flexor tendon to the ring and little finger identified to the ulnar side of the median nerve.  Carpal retinaculum was  incised with sharp dissection.  Right angled Sewall retractor was placed between the skin and forearm fascia.  The fascia was released for approximately 2 cm proximal to the wrist crease under direct vision.  Canal was explored.  Air compression to the nerve was apparent.  No further lesions were identified.  Bleeders were electrocauterized with bipolar.  The wound was copiously irrigated with saline, and skin was closed with interrupted 4-0 nylon sutures.  Local infiltration with 0.25% bupivacaine without epinephrine was given, approximately 8 mL was used.  Sterile compressive dressing was applied.  On deflation of the tourniquet, all fingers immediately pinked.  He was taken to the recovery room for observation in satisfactory condition.  He will be discharged home to return to the [facility] in 1 week on Norco.          ______________________________ Daryll Brod, M.D.     GK/MEDQ  D:  06/20/2015  T:  06/20/2015  Job:  [id number]
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
## 2015-06-20 NOTE — Discharge Instructions (Addendum)

## 2015-06-20 NOTE — Anesthesia Procedure Notes (Addendum)
Procedure Name: MAC Date/Time: 06/20/2015 8:30 AM Performed by: Eline Geng D Pre-anesthesia Checklist: Patient identified, Emergency Drugs available, Suction available, Patient being monitored and Timeout performed Patient Re-evaluated:Patient Re-evaluated prior to inductionOxygen Delivery Method: Simple face mask   Anesthesia Regional Block:  Bier block (IV Regional)  Pre-Anesthetic Checklist: ,, timeout performed, Correct Patient, Correct Site, Correct Laterality, Correct Procedure,, site marked, surgical consent,, at surgeon's request Needles:  Injection technique: Single-shot  Needle Type: Other      Needle Gauge: 20 and 20 G    Additional Needles: Bier block (IV Regional) Narrative:   Performed by: Personally

## 2015-06-20 NOTE — Anesthesia Preprocedure Evaluation (Addendum)
Anesthesia Evaluation  Patient identified by MRN, date of birth, ID band Patient awake    Reviewed: Allergy & Precautions, NPO status , Patient's Chart, lab work & pertinent test results  Airway Mallampati: II   Neck ROM: full    Dental   Pulmonary sleep apnea and Continuous Positive Airway Pressure Ventilation ,  breath sounds clear to auscultation        Cardiovascular negative cardio ROS  Rhythm:regular Rate:Normal     Neuro/Psych PSYCHIATRIC DISORDERS Anxiety Depression Bipolar Disorder  Neuromuscular disease    GI/Hepatic GERD-  Medicated,  Endo/Other  diabetes, Type 2, Insulin Dependent, Oral Hypoglycemic AgentsHypothyroidism Morbid obesity  Renal/GU      Musculoskeletal   Abdominal   Peds  Hematology   Anesthesia Other Findings   Reproductive/Obstetrics                            Anesthesia Physical Anesthesia Plan  ASA: II  Anesthesia Plan: MAC and Bier Block   Post-op Pain Management:    Induction: Intravenous  Airway Management Planned: Simple Face Mask  Additional Equipment:   Intra-op Plan:   Post-operative Plan:   Informed Consent: I have reviewed the patients History and Physical, chart, labs and discussed the procedure including the risks, benefits and alternatives for the proposed anesthesia with the patient or authorized representative who has indicated his/her understanding and acceptance.     Plan Discussed with: CRNA, Anesthesiologist and Surgeon  Anesthesia Plan Comments:         Anesthesia Quick Evaluation

## 2015-06-20 NOTE — Op Note (Signed)
Dictation Number [id number]

## 2015-06-20 NOTE — Brief Op Note (Signed)
06/20/2015  9:06 AM  PATIENT:  Leodis Rains  44 y.o. male  PRE-OPERATIVE DIAGNOSIS:  RIGHT CARPAL TUNNEL SYNDROME  POST-OPERATIVE DIAGNOSIS:  RIGHT CARPAL TUNNEL SYNDROME  PROCEDURE:  Procedure(s) with comments: RIGHT CARPAL TUNNEL RELEASE (Right) - REGIONAL/FAB  SURGEON:  Surgeon(s) and Role:    * Daryll Brod, MD - Primary  PHYSICIAN ASSISTANT:   ASSISTANTS: none   ANESTHESIA:   local and regional  EBL:     BLOOD ADMINISTERED:none  DRAINS: none   LOCAL MEDICATIONS USED:  BUPIVICAINE   SPECIMEN:  No Specimen  DISPOSITION OF SPECIMEN:  N/A  COUNTS:  YES  TOURNIQUET:   Total Tourniquet Time Documented: Forearm (Right) - 17 minutes Total: Forearm (Right) - 17 minutes   DICTATION: .Other Dictation: Dictation Number [id number]  PLAN OF CARE: Discharge to home after PACU  PATIENT DISPOSITION:  PACU - hemodynamically stable.

## 2015-06-20 NOTE — Anesthesia Postprocedure Evaluation (Signed)
Anesthesia Post Note  Patient: Marvin Espinoza  Procedure(s) Performed: Procedure(s) (LRB): RIGHT CARPAL TUNNEL RELEASE (Right)  Anesthesia type: MAC  Patient location: PACU  Post pain: Pain level controlled and Adequate analgesia  Post assessment: Post-op Vital signs reviewed, Patient's Cardiovascular Status Stable and Respiratory Function Stable  Last Vitals:  Filed Vitals:   06/20/15 0930  BP:   Pulse: 70  Temp:   Resp: 19    Post vital signs: Reviewed and stable  Level of consciousness: awake, alert  and oriented  Complications: No apparent anesthesia complications

## 2015-06-22 ENCOUNTER — Encounter (HOSPITAL_BASED_OUTPATIENT_CLINIC_OR_DEPARTMENT_OTHER): Payer: Self-pay | Admitting: Orthopedic Surgery

## 2016-03-18 DIAGNOSIS — Z713 Dietary counseling and surveillance: Secondary | ICD-10-CM | POA: Diagnosis not present

## 2016-03-18 DIAGNOSIS — E569 Vitamin deficiency, unspecified: Secondary | ICD-10-CM | POA: Diagnosis not present

## 2016-04-18 DIAGNOSIS — K601 Chronic anal fissure: Secondary | ICD-10-CM | POA: Diagnosis not present

## 2016-04-18 DIAGNOSIS — K5900 Constipation, unspecified: Secondary | ICD-10-CM | POA: Diagnosis not present

## 2016-05-27 DIAGNOSIS — Z794 Long term (current) use of insulin: Secondary | ICD-10-CM | POA: Diagnosis not present

## 2016-05-27 DIAGNOSIS — K219 Gastro-esophageal reflux disease without esophagitis: Secondary | ICD-10-CM | POA: Diagnosis not present

## 2016-05-27 DIAGNOSIS — E119 Type 2 diabetes mellitus without complications: Secondary | ICD-10-CM | POA: Diagnosis not present

## 2016-05-27 DIAGNOSIS — E782 Mixed hyperlipidemia: Secondary | ICD-10-CM | POA: Diagnosis not present

## 2016-07-08 DIAGNOSIS — M546 Pain in thoracic spine: Secondary | ICD-10-CM | POA: Diagnosis not present

## 2016-08-24 DIAGNOSIS — R21 Rash and other nonspecific skin eruption: Secondary | ICD-10-CM | POA: Diagnosis not present

## 2016-08-24 DIAGNOSIS — W57XXXA Bitten or stung by nonvenomous insect and other nonvenomous arthropods, initial encounter: Secondary | ICD-10-CM | POA: Diagnosis not present

## 2016-09-02 DIAGNOSIS — Z713 Dietary counseling and surveillance: Secondary | ICD-10-CM | POA: Diagnosis not present

## 2016-09-06 DIAGNOSIS — E782 Mixed hyperlipidemia: Secondary | ICD-10-CM | POA: Diagnosis not present

## 2016-09-06 DIAGNOSIS — E119 Type 2 diabetes mellitus without complications: Secondary | ICD-10-CM | POA: Diagnosis not present

## 2016-09-06 DIAGNOSIS — K219 Gastro-esophageal reflux disease without esophagitis: Secondary | ICD-10-CM | POA: Diagnosis not present

## 2016-09-06 DIAGNOSIS — Z9884 Bariatric surgery status: Secondary | ICD-10-CM | POA: Diagnosis not present

## 2016-11-19 ENCOUNTER — Ambulatory Visit (INDEPENDENT_AMBULATORY_CARE_PROVIDER_SITE_OTHER): Payer: BLUE CROSS/BLUE SHIELD | Admitting: Physician Assistant

## 2016-11-19 ENCOUNTER — Encounter: Payer: Self-pay | Admitting: Physician Assistant

## 2016-11-19 VITALS — BP 126/86 | HR 51 | Temp 97.3°F | Ht 70.0 in | Wt 209.2 lb

## 2016-11-19 DIAGNOSIS — M5136 Other intervertebral disc degeneration, lumbar region: Secondary | ICD-10-CM | POA: Diagnosis not present

## 2016-11-19 DIAGNOSIS — K219 Gastro-esophageal reflux disease without esophagitis: Secondary | ICD-10-CM

## 2016-11-19 DIAGNOSIS — Z23 Encounter for immunization: Secondary | ICD-10-CM | POA: Diagnosis not present

## 2016-11-19 DIAGNOSIS — E039 Hypothyroidism, unspecified: Secondary | ICD-10-CM

## 2016-11-19 MED ORDER — ALPRAZOLAM 1 MG PO TABS: 1.0000 mg | ORAL_TABLET | Freq: Three times a day (TID) | ORAL | 5 refills | Status: DC | PRN

## 2016-11-19 MED ORDER — TRAMADOL HCL 50 MG PO TABS: 50.0000 mg | ORAL_TABLET | Freq: Four times a day (QID) | ORAL | 5 refills | Status: DC | PRN

## 2016-11-19 MED ORDER — PROMETHAZINE HCL 25 MG PO TABS: 25.0000 mg | ORAL_TABLET | Freq: Four times a day (QID) | ORAL | 2 refills | Status: DC | PRN

## 2016-11-19 NOTE — Progress Notes (Signed)
BP 126/86   Pulse (!) 51   Temp 97.3 F (36.3 C) (Oral)   Ht 5\' 10"  (1.778 m)   Wt 209 lb 3.2 oz (94.9 kg)   BMI 30.02 kg/m    Subjective:    Patient ID: Marvin Espinoza, male    DOB: 1971-06-11, 45 y.o.   MRN: [id number]  Marvin Espinoza is a 45 y.o. male presenting on 11/19/2016 for Follow-up  HPI Patient here to be established as new patient at [facility].  This patient is known to me from [facility]. This patient comes in for periodic recheck on medications and conditions. All medications are reviewed today. There are no reports of any problems with the medications. All of the medical conditions are reviewed and updated.  Lab work is reviewed and will be ordered as medically necessary.   Bariatric surgery: Patient has done wonderfully after his bariatric surgery approximately one year ago. He is maintaining very well in the very active. He has a great improvement in all of his daily functions. He is completely off of insulin. He does need lab work to be followed. And we will do this. We need a thyroid checked also. He does complain of some excessive cold intolerance compared to before.  Past Medical History:  Diagnosis Date  . Anxiety   . Bipolar disorder ([place])   . CTS (carpal tunnel syndrome)   . Depression   . GERD (gastroesophageal reflux disease)   . H/O bariatric surgery   . Hypothyroidism   . Hypothyroidism   . Sleep apnea    uses CPAP nightly  . Sleep apnea    wears CPAP nightly    Relevant past medical, surgical, family and social history reviewed and updated as indicated. Interim medical history since our last visit reviewed. Allergies and medications reviewed and updated.   Data reviewed from any sources in EPIC.  Review of Systems  Constitutional: Negative.  Negative for appetite change and fatigue.  HENT: Negative.   Eyes: Negative.  Negative for pain and visual disturbance.  Respiratory: Negative.  Negative for cough,  chest tightness, shortness of breath and wheezing.   Cardiovascular: Negative.  Negative for chest pain, palpitations and leg swelling.  Gastrointestinal: Negative.  Negative for abdominal pain, diarrhea, nausea and vomiting.  Endocrine: Negative.   Genitourinary: Negative.   Musculoskeletal: Negative.   Skin: Negative.  Negative for color change and rash.  Neurological: Negative.  Negative for weakness, numbness and headaches.  Psychiatric/Behavioral: Negative.      Social History   Social History  . Marital status: Married    Spouse name: N/A  . Number of children: N/A  . Years of education: N/A   Occupational History  . Not on file.   Social History Main Topics  . Smoking status: Never Smoker  . Smokeless tobacco: Never Used  . Alcohol use No  . Drug use: No  . Sexual activity: Yes   Other Topics Concern  . Not on file   Social History Narrative   ** Merged History Encounter **        Past Surgical History:  Procedure Laterality Date  . ANAL FISSURE REPAIR    . BARIATRIC SURGERY    . CARPAL TUNNEL RELEASE Left 05/09/2015   Procedure: LEFT CARPAL TUNNEL RELEASE;  Surgeon: Daryll Brod, MD;  Location: [facility];  Service: Orthopedics;  Laterality: Left;  . CARPAL TUNNEL RELEASE Left 05-09-2015  . CARPAL TUNNEL RELEASE Right 06/20/2015  Procedure: RIGHT CARPAL TUNNEL RELEASE;  Surgeon: Daryll Brod, MD;  Location: [facility];  Service: Orthopedics;  Laterality: Right;  REGIONAL/FAB  . CHOLECYSTECTOMY      Family History  Problem Relation Age of Onset  . COPD Mother   . Diabetes Mother   . Heart disease Mother   . Depression Mother   . COPD Father   . Heart disease Father   . Kidney disease Father   . Mental illness Father   . Depression Father   . Mental illness Sister   . Depression Sister        Medication List       Accurate as of 11/19/16 10:02 PM. Always use your most recent med list.          ALPRAZolam 1 MG  tablet Commonly known as:  XANAX Take 1 tablet (1 mg total) by mouth 3 (three) times daily as needed for anxiety.   citalopram 40 MG tablet Commonly known as:  CELEXA Take 40 mg by mouth daily.   cyclobenzaprine 10 MG tablet Commonly known as:  FLEXERIL Take 10 mg by mouth 3 (three) times daily as needed for muscle spasms.   levothyroxine 100 MCG tablet Commonly known as:  SYNTHROID, LEVOTHROID Take 100 mcg by mouth daily before breakfast.   omeprazole 40 MG capsule Commonly known as:  PRILOSEC Take 40 mg by mouth daily.   promethazine 25 MG tablet Commonly known as:  PHENERGAN Take 1 tablet (25 mg total) by mouth every 6 (six) hours as needed for nausea or vomiting.   traMADol 50 MG tablet Commonly known as:  ULTRAM Take 1 tablet (50 mg total) by mouth every 6 (six) hours as needed.          Objective:    BP 126/86   Pulse (!) 51   Temp 97.3 F (36.3 C) (Oral)   Ht 5\' 10"  (1.778 m)   Wt 209 lb 3.2 oz (94.9 kg)   BMI 30.02 kg/m   Allergies  Allergen Reactions  . Abilify [Aripiprazole] Anaphylaxis  . Victoza [Liraglutide] Other (See Comments)    Severe heartburn   . Victoza [Liraglutide]     Severe heartburn   Wt Readings from Last 3 Encounters:  11/19/16 209 lb 3.2 oz (94.9 kg)  06/20/15 (!) 330 lb (149.7 kg)  05/09/15 (!) 338 lb 4 oz (153.4 kg)    Physical Exam  Constitutional: He appears well-developed and well-nourished.  HENT:  Head: Normocephalic and atraumatic.  Eyes: Conjunctivae and EOM are normal. Pupils are equal, round, and reactive to light.  Neck: Normal range of motion. Neck supple.  Cardiovascular: Normal rate, regular rhythm and normal heart sounds.   Pulmonary/Chest: Effort normal and breath sounds normal.  Abdominal: Soft. Bowel sounds are normal.  Musculoskeletal: Normal range of motion.  Skin: Skin is warm and dry.        Assessment & Plan:   1. Gastroesophageal reflux disease without esophagitis - promethazine (PHENERGAN)  25 MG tablet; Take 1 tablet (25 mg total) by mouth every 6 (six) hours as needed for nausea or vomiting.  Dispense: 60 tablet; Refill: 2  2. Hypothyroidism, unspecified type - TSH  3. DDD (degenerative disc disease), lumbar - traMADol (ULTRAM) 50 MG tablet; Take 1 tablet (50 mg total) by mouth every 6 (six) hours as needed.  Dispense: 120 tablet; Refill: 5  4. Encounter for immunization - traMADol (ULTRAM) 50 MG tablet; Take 1 tablet (50 mg total) by mouth every  6 (six) hours as needed.  Dispense: 120 tablet; Refill: 5 - promethazine (PHENERGAN) 25 MG tablet; Take 1 tablet (25 mg total) by mouth every 6 (six) hours as needed for nausea or vomiting.  Dispense: 60 tablet; Refill: 2 - ALPRAZolam (XANAX) 1 MG tablet; Take 1 tablet (1 mg total) by mouth 3 (three) times daily as needed for anxiety.  Dispense: 30 tablet; Refill: 5 - TSH - Flu Vaccine QUAD 36+ mos IM   Continue all other maintenance medications as listed above. Educational handout given for  hypothyroidism  Follow up plan: Return in about 6 months (around 05/20/2017) for recheck.  Terald Sleeper PA-C [facility] [address]  [place], [place] [address] [phone]   11/19/2016, 10:02 PM

## 2016-11-19 NOTE — Patient Instructions (Signed)
Hypothyroidism Hypothyroidism is a disorder of the thyroid. The thyroid is a large gland that is located in the lower front of the neck. The thyroid releases hormones that control how the body works. With hypothyroidism, the thyroid does not make enough of these hormones. What are the causes? Causes of hypothyroidism may include:  Viral infections.  Pregnancy.  Your own defense system (immune system) attacking your thyroid.  Certain medicines.  Birth defects.  Past radiation treatments to your head or neck.  Past treatment with radioactive iodine.  Past surgical removal of part or all of your thyroid.  Problems with the gland that is located in the center of your brain (pituitary).  What are the signs or symptoms? Signs and symptoms of hypothyroidism may include:  Feeling as though you have no energy (lethargy).  Inability to tolerate cold.  Weight gain that is not explained by a change in diet or exercise habits.  Dry skin.  Coarse hair.  Menstrual irregularity.  Slowing of thought processes.  Constipation.  Sadness or depression.  How is this diagnosed? Your health care provider may diagnose hypothyroidism with blood tests and ultrasound tests. How is this treated? Hypothyroidism is treated with medicine that replaces the hormones that your body does not make. After you begin treatment, it may take several weeks for symptoms to go away. Follow these instructions at home:  Take medicines only as directed by your health care provider.  If you start taking any new medicines, tell your health care provider.  Keep all follow-up visits as directed by your health care provider. This is important. As your condition improves, your dosage needs may change. You will need to have blood tests regularly so that your health care provider can watch your condition. Contact a health care provider if:  Your symptoms do not get better with treatment.  You are taking thyroid  replacement medicine and: ? You sweat excessively. ? You have tremors. ? You feel anxious. ? You lose weight rapidly. ? You cannot tolerate heat. ? You have emotional swings. ? You have diarrhea. ? You feel weak. Get help right away if:  You develop chest pain.  You develop an irregular heartbeat.  You develop a rapid heartbeat. This information is not intended to replace advice given to you by your health care provider. Make sure you discuss any questions you have with your health care provider. Document Released: 12/02/2005 Document Revised: 05/09/2016 Document Reviewed: 04/19/2014 Elsevier Interactive Patient Education  2017 [employer] Inc[employer]  

## 2016-11-20 LAB — TSH: TSH: 1.59 u[IU]/mL (ref 0.450–4.500)

## 2016-11-29 ENCOUNTER — Encounter: Payer: Self-pay | Admitting: Physician Assistant

## 2016-11-30 ENCOUNTER — Other Ambulatory Visit: Payer: Self-pay | Admitting: Physician Assistant

## 2016-12-02 DIAGNOSIS — Z713 Dietary counseling and surveillance: Secondary | ICD-10-CM | POA: Diagnosis not present

## 2016-12-02 DIAGNOSIS — K5900 Constipation, unspecified: Secondary | ICD-10-CM | POA: Diagnosis not present

## 2016-12-02 DIAGNOSIS — R635 Abnormal weight gain: Secondary | ICD-10-CM | POA: Diagnosis not present

## 2016-12-02 DIAGNOSIS — Z9884 Bariatric surgery status: Secondary | ICD-10-CM | POA: Diagnosis not present

## 2016-12-02 DIAGNOSIS — E663 Overweight: Secondary | ICD-10-CM | POA: Diagnosis not present

## 2016-12-02 DIAGNOSIS — E569 Vitamin deficiency, unspecified: Secondary | ICD-10-CM | POA: Diagnosis not present

## 2016-12-02 DIAGNOSIS — Z6829 Body mass index (BMI) 29.0-29.9, adult: Secondary | ICD-10-CM | POA: Diagnosis not present

## 2017-01-14 ENCOUNTER — Encounter: Payer: Self-pay | Admitting: Neurology

## 2017-01-14 DIAGNOSIS — M25562 Pain in left knee: Secondary | ICD-10-CM | POA: Diagnosis not present

## 2017-01-17 ENCOUNTER — Encounter: Payer: Self-pay | Admitting: Family Medicine

## 2017-01-17 ENCOUNTER — Ambulatory Visit (INDEPENDENT_AMBULATORY_CARE_PROVIDER_SITE_OTHER): Payer: BLUE CROSS/BLUE SHIELD | Admitting: Family Medicine

## 2017-01-17 VITALS — BP 133/79 | HR 64 | Temp 97.7°F | Ht 70.0 in | Wt 213.0 lb

## 2017-01-17 DIAGNOSIS — H1711 Central corneal opacity, right eye: Secondary | ICD-10-CM | POA: Diagnosis not present

## 2017-01-17 DIAGNOSIS — H04123 Dry eye syndrome of bilateral lacrimal glands: Secondary | ICD-10-CM | POA: Diagnosis not present

## 2017-01-17 DIAGNOSIS — F39 Unspecified mood [affective] disorder: Secondary | ICD-10-CM

## 2017-01-17 DIAGNOSIS — H579 Unspecified disorder of eye and adnexa: Secondary | ICD-10-CM | POA: Diagnosis not present

## 2017-01-17 DIAGNOSIS — E119 Type 2 diabetes mellitus without complications: Secondary | ICD-10-CM | POA: Diagnosis not present
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
## 2017-01-17 NOTE — Patient Instructions (Signed)
Great to see you!  You have an eye appt at [facility] at 1240.

## 2017-01-20 ENCOUNTER — Ambulatory Visit: Payer: BLUE CROSS/BLUE SHIELD | Admitting: Physician Assistant

## 2017-01-22 ENCOUNTER — Telehealth: Payer: Self-pay | Admitting: Physician Assistant

## 2017-01-22 MED ORDER — DIAZEPAM 10 MG PO TABS: 10.0000 mg | ORAL_TABLET | Freq: Three times a day (TID) | ORAL | 1 refills | Status: DC | PRN

## 2017-01-22 MED ORDER — LAMOTRIGINE 25 MG PO TABS: 25.0000 mg | ORAL_TABLET | Freq: Two times a day (BID) | ORAL | 0 refills | Status: DC

## 2017-01-22 NOTE — Telephone Encounter (Signed)
Start meds, call in valium, appt Friday
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
## 2017-01-24 ENCOUNTER — Encounter: Payer: Self-pay | Admitting: Physician Assistant

## 2017-01-24 ENCOUNTER — Ambulatory Visit (INDEPENDENT_AMBULATORY_CARE_PROVIDER_SITE_OTHER): Payer: BLUE CROSS/BLUE SHIELD | Admitting: Physician Assistant

## 2017-01-24 VITALS — BP 120/70 | HR 72 | Temp 97.1°F | Ht 70.0 in | Wt 210.0 lb

## 2017-01-24 DIAGNOSIS — F4322 Adjustment disorder with anxiety: Secondary | ICD-10-CM | POA: Insufficient documentation

## 2017-01-24 DIAGNOSIS — F411 Generalized anxiety disorder: Secondary | ICD-10-CM | POA: Diagnosis not present

## 2017-01-24 DIAGNOSIS — F5102 Adjustment insomnia: Secondary | ICD-10-CM | POA: Insufficient documentation

## 2017-01-24 MED ORDER — DIAZEPAM 10 MG PO TABS
10.0000 mg | ORAL_TABLET | Freq: Three times a day (TID) | ORAL | 1 refills | Status: DC | PRN
Start: 2017-01-24 — End: 2017-01-24

## 2017-01-24 MED ORDER — DIAZEPAM 10 MG PO TABS: 10.0000 mg | ORAL_TABLET | Freq: Three times a day (TID) | ORAL | 1 refills | Status: DC | PRN

## 2017-01-24 MED ORDER — TRAZODONE HCL 100 MG PO TABS: 100.0000 mg | ORAL_TABLET | Freq: Every evening | ORAL | 2 refills | Status: DC | PRN

## 2017-01-24 NOTE — Patient Instructions (Signed)
Adjustment Disorder Adjustment disorder is an unusually severe reaction to a stressful life event, such as the loss of a job or physical illness. The event may be any stressful event other than the loss of a loved one. Adjustment disorder may affect your feelings, your thinking, how you act, or a combination of these. It may interfere with personal relationships or with the way you are at work, school, or home. People with this disorder are at risk for suicide and substance abuse. They may develop a more serious mental disorder, such as major depressive disorder or post-traumatic stress disorder. SIGNS AND SYMPTOMS  Symptoms may include:  Sadness, depressed mood, or crying spells.  Loss of enjoyment.  Change in appetite or weight.  Sense of loss or hopelessness.  Thoughts of suicide.  Anxiety, worry, or nervousness.  Trouble sleeping.  Avoiding family and friends.  Poor school performance.  Fighting or vandalism.  Reckless driving.  Skipping school.  Poor work performance.  Ignoring bills. Symptoms of adjustment disorder start within 3 months of the stressful life event. They do not last more than 6 months after the event has ended. DIAGNOSIS  To make a diagnosis, your health care provider will ask about what has happened in your life and how it has affected you. He or she may also ask about your medical history and use of medicines, alcohol, and other substances. Your health care provider may do a physical exam and order lab tests or other studies. You may be referred to a mental health specialist for evaluation. TREATMENT  Treatment options include:  Counseling or talk therapy. Talk therapy is usually provided by mental health specialists.  Medicine. Certain medicines may help with depression, anxiety, and sleep.  Support groups. Support groups offer emotional support, advice, and guidance. They are made up of people who have had similar experiences. HOME CARE  INSTRUCTIONS  Keep all follow-up visits as directed by your health care provider. This is important.  Take medicines only as directed by your health care provider. SEEK MEDICAL CARE IF:  Your symptoms get worse.  SEEK IMMEDIATE MEDICAL CARE IF: You have serious thoughts about hurting yourself or someone else. MAKE SURE YOU:  Understand these instructions.  Will watch your condition.  Will get help right away if you are not doing well or get worse. This information is not intended to replace advice given to you by your health care provider. Make sure you discuss any questions you have with your health care provider. Document Released: 08/06/2006 Document Revised: 03/25/2016 Document Reviewed: 04/26/2014 Elsevier Interactive Patient Education  2017 Elsevier Inc.  
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
## 2017-01-30 ENCOUNTER — Telehealth: Payer: Self-pay

## 2017-01-31 NOTE — Telephone Encounter (Signed)
Spoke with wife  (DPR 11/2016) and told her we had a list of offices that we recommend and  asked her if would she like for me to mail her a copy. She asked if I could fax her a copy and gave me the fax number of [phone]. Faxed and received confirmation of receipt

## 2017-01-31 NOTE — Telephone Encounter (Signed)
Here's the list of offices we recommend.   The following list of offices requires the patient to call and make their own appointment, as there is information they need that only you can provide. Please feel free to choose form the following providers:  [facility] in [place]  [place]  [phone] [place], [place]  (Scheduled through [place]) Must call and do an interview for appointment. Sees Children / Accepts Medicaid  [facility] in [place]  [address], [place]    [place], [place]  [phone] [place], [place] for Autism but does not treat it Sees Children / Accepts Medicaid  [facility]    [phone] [address], [place], [place] Medication management, substance abuse, bipolar, grief, family, marriage, OCD, anxiety, PTSD Sees children / Accepts Medicaid  [facility]    [phone] [address] [place], [place] children / Accepts [facility]  [facility]  [phone] [address] [place], [place]   Dr [facility]     [phone] [address], [place], [place]  Sees ADD & ADHD for treatment Accepts Medicaid  [facility]  [phone] [address] Arlean Hopping, [place] for Autism Accepts [facility]  [facility]  [phone] [place], [place]  Does Adult ADD evaluations Does not accept Medicaid  [facility]   [phone] [place], [place] children as young as 71 years old Accepts [facility]     [phone]    [place], [place] [address] Sees children Accepts Medicaid

## 2017-02-01 DIAGNOSIS — M109 Gout, unspecified: Secondary | ICD-10-CM | POA: Diagnosis not present

## 2017-02-03 ENCOUNTER — Ambulatory Visit (INDEPENDENT_AMBULATORY_CARE_PROVIDER_SITE_OTHER): Payer: BLUE CROSS/BLUE SHIELD | Admitting: Physician Assistant

## 2017-02-03 ENCOUNTER — Other Ambulatory Visit: Payer: Self-pay | Admitting: Physician Assistant

## 2017-02-03 ENCOUNTER — Encounter: Payer: Self-pay | Admitting: Physician Assistant

## 2017-02-03 VITALS — BP 124/76 | HR 46 | Temp 97.7°F | Ht 70.0 in | Wt 209.8 lb

## 2017-02-03 DIAGNOSIS — M109 Gout, unspecified: Secondary | ICD-10-CM | POA: Diagnosis not present

## 2017-02-03 DIAGNOSIS — F419 Anxiety disorder, unspecified: Secondary | ICD-10-CM | POA: Diagnosis not present

## 2017-02-03 MED ORDER — COLCHICINE 0.6 MG PO CAPS: 0.6000 mg | ORAL_CAPSULE | Freq: Two times a day (BID) | ORAL | 2 refills | Status: DC

## 2017-02-03 NOTE — Patient Instructions (Signed)

## 2017-02-03 NOTE — Progress Notes (Signed)
BP 124/76   Pulse (!) 46   Temp 97.7 F (36.5 C) (Oral)   Ht 5\' 10"  (1.778 m)   Wt 209 lb 12.8 oz (95.2 kg)   BMI 30.10 kg/m    Subjective:    Patient ID: Marvin Espinoza, male    DOB: [birth year], 46 y.o.   MRN: [id number]  HPI: Marvin Espinoza is a 46 y.o. male presenting on 02/03/2017 for right great toe swelling   History of gout over past 20 years. This episode is severe pain in the great right toe. Worsens after walking but never goes away. Cannot take NSAIDs due to stomach surgery. Prednisone has been tried and failed.  Relevant past medical, surgical, family and social history reviewed and updated as indicated. Allergies and medications reviewed and updated.  Past Medical History:  Diagnosis Date  . Anxiety   . Bipolar disorder ([place])   . CTS (carpal tunnel syndrome)   . Depression   . GERD (gastroesophageal reflux disease)   . H/O bariatric surgery   . Hypothyroidism   . Hypothyroidism   . Sleep apnea    uses CPAP nightly  . Sleep apnea    wears CPAP nightly    Past Surgical History:  Procedure Laterality Date  . ANAL FISSURE REPAIR    . BARIATRIC SURGERY    . CARPAL TUNNEL RELEASE Left 05/09/2015   Procedure: LEFT CARPAL TUNNEL RELEASE;  Surgeon: Daryll Brod, MD;  Location: [place] [facility];  Service: Orthopedics;  Laterality: Left;  . CARPAL TUNNEL RELEASE Left 05-09-2015  . CARPAL TUNNEL RELEASE Right 06/20/2015   Procedure: RIGHT CARPAL TUNNEL RELEASE;  Surgeon: Daryll Brod, MD;  Location: [facility];  Service: Orthopedics;  Laterality: Right;  REGIONAL/FAB  . CHOLECYSTECTOMY      Review of Systems  Constitutional: Negative.  Negative for appetite change and fatigue.  Eyes: Negative for pain and visual disturbance.  Respiratory: Negative.  Negative for cough, chest tightness, shortness of breath and wheezing.   Cardiovascular: Negative.  Negative for chest pain, palpitations and leg swelling.  Gastrointestinal: Negative.  Negative for  abdominal pain, diarrhea, nausea and vomiting.  Genitourinary: Negative.   Musculoskeletal: Positive for arthralgias, gait problem and joint swelling.  Skin: Positive for color change. Negative for rash.  Neurological: Negative for weakness, numbness and headaches.  Psychiatric/Behavioral: Negative.     Allergies as of 02/03/2017      Reactions   Abilify [aripiprazole] Anaphylaxis   Alcohol Itching   Drinking alcohol   Hydrocodone-acetaminophen Itching   Victoza [liraglutide] Other (See Comments)   Severe heartburn   Victoza [liraglutide]    Severe heartburn      Medication List       Accurate as of 02/03/17  8:28 AM. Always use your most recent med list.          ALPRAZolam 1 MG tablet Commonly known as:  XANAX Take 1 tablet (1 mg total) by mouth 3 (three) times daily as needed for anxiety.   citalopram 40 MG tablet Commonly known as:  CELEXA Take 40 mg by mouth daily.   Colchicine 0.6 MG Caps Take 0.6 mg by mouth 2 (two) times daily.   cyclobenzaprine 10 MG tablet Commonly known as:  FLEXERIL Take 10 mg by mouth 3 (three) times daily as needed for muscle spasms.   diazepam 10 MG tablet Commonly known as:  VALIUM Take 1 tablet (10 mg total) by mouth every 8 (eight) hours as needed for anxiety.  lamoTRIgine 25 MG tablet Commonly known as:  LAMICTAL Take 1-2 tablets (25-50 mg total) by mouth 2 (two) times daily.   levothyroxine 100 MCG tablet Commonly known as:  SYNTHROID, LEVOTHROID TAKE ONE (1) TABLET EACH DAY   omeprazole 40 MG capsule Commonly known as:  PRILOSEC Take 40 mg by mouth daily.   predniSONE 10 MG tablet Commonly known as:  DELTASONE   promethazine 25 MG tablet Commonly known as:  PHENERGAN Take 1 tablet (25 mg total) by mouth every 6 (six) hours as needed for nausea or vomiting.   traMADol 50 MG tablet Commonly known as:  ULTRAM Take 1 tablet (50 mg total) by mouth every 6 (six) hours as needed.   traZODone 100 MG tablet Commonly  known as:  DESYREL Take 1-2 tablets (100-200 mg total) by mouth at bedtime as needed for sleep.          Objective:    BP 124/76   Pulse (!) 46   Temp 97.7 F (36.5 C) (Oral)   Ht 5\' 10"  (1.778 m)   Wt 209 lb 12.8 oz (95.2 kg)   BMI 30.10 kg/m   Allergies  Allergen Reactions  . Abilify [Aripiprazole] Anaphylaxis  . Alcohol Itching    Drinking alcohol  . Hydrocodone-Acetaminophen Itching  . Victoza [Liraglutide] Other (See Comments)    Severe heartburn   . Victoza [Liraglutide]     Severe heartburn    Physical Exam  Constitutional: He appears well-developed and well-nourished. No distress.  HENT:  Head: Normocephalic and atraumatic.  Eyes: Conjunctivae and EOM are normal. Pupils are equal, round, and reactive to light.  Cardiovascular: Normal rate, regular rhythm and normal heart sounds.   Pulmonary/Chest: Effort normal and breath sounds normal. No respiratory distress.  Musculoskeletal:       Right foot: There is decreased range of motion, tenderness, swelling and deformity.       Feet:  Skin: Skin is warm and dry.  Psychiatric: He has a normal mood and affect. His behavior is normal.  Nursing note and vitals reviewed.       Assessment & Plan:   1. Acute gout involving toe of right foot, unspecified cause - Colchicine 0.6 MG CAPS; Take 0.6 mg by mouth 2 (two) times daily.  Dispense: 40 capsule; Refill: 2 Failed prednisone Cannot take NSAIDs due to stomach surgery  Continue all other maintenance medications as listed above.  Follow up plan: Follow-up as needed or worsening of symptoms. Call office for any issues.   Educational handout given for gout  Terald Sleeper PA-C [facility] [address]  [place], [place] [address] [phone]   02/03/2017, 8:28 AM

## 2017-02-10 ENCOUNTER — Encounter: Payer: Self-pay | Admitting: Physician Assistant

## 2017-02-10 ENCOUNTER — Ambulatory Visit (INDEPENDENT_AMBULATORY_CARE_PROVIDER_SITE_OTHER): Payer: BLUE CROSS/BLUE SHIELD | Admitting: Physician Assistant

## 2017-02-10 VITALS — BP 115/78 | HR 55 | Temp 97.4°F | Ht 70.0 in | Wt 210.4 lb

## 2017-02-10 DIAGNOSIS — Z87820 Personal history of traumatic brain injury: Secondary | ICD-10-CM | POA: Insufficient documentation

## 2017-02-10 DIAGNOSIS — F39 Unspecified mood [affective] disorder: Secondary | ICD-10-CM

## 2017-02-10 DIAGNOSIS — F0781 Postconcussional syndrome: Secondary | ICD-10-CM | POA: Insufficient documentation

## 2017-02-10 DIAGNOSIS — F411 Generalized anxiety disorder: Secondary | ICD-10-CM

## 2017-02-10 HISTORY — DX: Personal history of traumatic brain injury: Z87.820
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
## 2017-02-10 NOTE — Patient Instructions (Addendum)
Concussion, Adult ° °A concussion is a brain injury. It is caused by: °A hit to the head. °A quick and sudden movement (jolt) of the head or neck. °A concussion is usually not life threatening. Even so, it can cause serious problems. If you had a concussion before, you may have concussion-like problems after a hit to your head. °Follow these instructions at home: °General instructions °Follow your doctor's directions carefully. °Take medicines only as told by your doctor. °Only take medicines your doctor says are safe. °Do not drink alcohol until your doctor says it is okay. Alcohol and some drugs can slow down healing. They can also put you at risk for further injury. °If you are having trouble remembering things, write them down. °Try to do one thing at a time if you get distracted easily. For example, do not watch TV while making dinner. °Talk to your family members or close friends when making important decisions. °Follow up with your doctor as told. °Watch your symptoms. Tell others to do the same. Serious problems can sometimes happen after a concussion. Older adults are more likely to have these problems. °Tell your teachers, school nurse, school counselor, coach, athletic trainer, or work manager about your concussion. Tell them about what you can or cannot do. They should watch to see if: °It gets even harder for you to pay attention or concentrate. °It gets even harder for you to remember things or learn new things. °You need more time than normal to finish things. °You become annoyed (irritable) more than before. °You are not able to deal with stress as well. °You have more problems than before. °Rest. Make sure you: °Get plenty of sleep at night. °Go to sleep early. °Go to bed at the same time every day. Try to wake up at the same time. °Rest during the day. °Take naps when you feel tired. °Limit activities where you have to think a lot or concentrate. These include: °Doing homework. °Doing work related  to a job. °Watching TV. °Using the computer. °Returning To Your Regular Activities  °Return to your normal activities slowly, not all at once. You must give your body and brain enough time to heal. °Do not play sports or do other athletic activities until your doctor says it is okay. °Ask your doctor when you can drive, ride a bicycle, or work other vehicles or machines. Never do these things if you feel dizzy. °Ask your doctor about when you can return to work or school. °Preventing Another Concussion  °It is very important to avoid another brain injury, especially before you have healed. In rare cases, another injury can lead to permanent brain damage, brain swelling, or death. The risk of this is greatest during the first 7-10 days after your injury. Avoid injuries by: °Wearing a seat belt when riding in a car. °Not drinking too much alcohol. °Avoiding activities that could lead to a second concussion (such as contact sports). °Wearing a helmet when doing activities like: °Biking. °Skiing. °Skateboarding. °Skating. °Making your home safer by: °Removing things from the floor or stairways that could make you trip. °Using grab bars in bathrooms and handrails by stairs. °Placing non-slip mats on floors and in bathtubs. °Improve lighting in dark areas. °Contact a doctor if: °It gets even harder for you to pay attention or concentrate. °It gets even harder for you to remember things or learn new things. °You need more time than normal to finish things. °You become annoyed (irritable) more than before. °You   a doctor if:  It gets even harder for you to pay attention or concentrate.  It gets even harder for you to remember things or learn new things.  You need more time than normal to finish things.  You become annoyed (irritable) more than before.  You are not able to deal with stress as well.  You have more problems than before.  You have problems keeping your balance.  You are not able to react quickly when you should. Get help if you have any of these problems for more than 2 weeks:  Lasting (chronic) headaches.  Dizziness or trouble  balancing.  Feeling sick to your stomach (nausea).  Seeing (vision) problems.  Being affected by noises or light more than normal.  Feeling sad, low, down in the dumps, blue, gloomy, or empty (depressed).  Mood changes (mood swings).  Feeling of fear or nervousness about what may happen (anxiety).  Feeling annoyed.  Memory problems.  Problems concentrating or paying attention.  Sleep problems.  Feeling tired all the time. Get help right away if:  You have bad headaches or your headaches get worse.  You have weakness (even if it is in one hand, leg, or part of the face).  You have loss of feeling (numbness).  You feel off balance.  You keep throwing up (vomiting).  You feel tired.  One black center of your eye (pupil) is larger than the other.  You twitch or shake violently (convulse).  Your speech is not clear (slurred).  You are more confused, easily angered (agitated), or annoyed than before.  You have more trouble resting than before.  You are unable to recognize people or places.  You have neck pain.  It is difficult to wake you up.  You have unusual behavior changes.  You pass out (lose consciousness). This information is not intended to replace advice given to you by your health care provider. Make sure you discuss any questions you have with your health care provider. Document Released: 11/20/2009 Document Revised: 05/09/2016 Document Reviewed: 06/24/2013 Elsevier Interactive Patient Education  2017 [employer]. con

## 2017-02-22 ENCOUNTER — Other Ambulatory Visit: Payer: Self-pay | Admitting: Physician Assistant

## 2017-02-26 DIAGNOSIS — F411 Generalized anxiety disorder: Secondary | ICD-10-CM | POA: Diagnosis not present

## 2017-02-26 DIAGNOSIS — F4310 Post-traumatic stress disorder, unspecified: Secondary | ICD-10-CM | POA: Diagnosis not present

## 2017-02-26 DIAGNOSIS — F319 Bipolar disorder, unspecified: Secondary | ICD-10-CM | POA: Diagnosis not present

## 2017-02-26 DIAGNOSIS — F429 Obsessive-compulsive disorder, unspecified: Secondary | ICD-10-CM | POA: Diagnosis not present

## 2017-02-27 DIAGNOSIS — F429 Obsessive-compulsive disorder, unspecified: Secondary | ICD-10-CM | POA: Diagnosis not present

## 2017-02-27 DIAGNOSIS — F411 Generalized anxiety disorder: Secondary | ICD-10-CM | POA: Diagnosis not present

## 2017-02-27 DIAGNOSIS — F4310 Post-traumatic stress disorder, unspecified: Secondary | ICD-10-CM | POA: Diagnosis not present

## 2017-02-27 DIAGNOSIS — F319 Bipolar disorder, unspecified: Secondary | ICD-10-CM | POA: Diagnosis not present

## 2017-03-06 DIAGNOSIS — F411 Generalized anxiety disorder: Secondary | ICD-10-CM | POA: Diagnosis not present

## 2017-03-06 DIAGNOSIS — F429 Obsessive-compulsive disorder, unspecified: Secondary | ICD-10-CM | POA: Diagnosis not present

## 2017-03-06 DIAGNOSIS — F319 Bipolar disorder, unspecified: Secondary | ICD-10-CM | POA: Diagnosis not present

## 2017-03-06 DIAGNOSIS — F4310 Post-traumatic stress disorder, unspecified: Secondary | ICD-10-CM | POA: Diagnosis not present

## 2017-03-07 ENCOUNTER — Other Ambulatory Visit: Payer: Self-pay | Admitting: Physician Assistant

## 2017-03-21 DIAGNOSIS — F4310 Post-traumatic stress disorder, unspecified: Secondary | ICD-10-CM | POA: Diagnosis not present

## 2017-03-21 DIAGNOSIS — F429 Obsessive-compulsive disorder, unspecified: Secondary | ICD-10-CM | POA: Diagnosis not present

## 2017-03-21 DIAGNOSIS — F319 Bipolar disorder, unspecified: Secondary | ICD-10-CM | POA: Diagnosis not present

## 2017-03-21 DIAGNOSIS — F411 Generalized anxiety disorder: Secondary | ICD-10-CM | POA: Diagnosis not present

## 2017-04-03 DIAGNOSIS — F4310 Post-traumatic stress disorder, unspecified: Secondary | ICD-10-CM | POA: Diagnosis not present

## 2017-04-03 DIAGNOSIS — F411 Generalized anxiety disorder: Secondary | ICD-10-CM | POA: Diagnosis not present

## 2017-04-03 DIAGNOSIS — F319 Bipolar disorder, unspecified: Secondary | ICD-10-CM | POA: Diagnosis not present

## 2017-04-03 DIAGNOSIS — F429 Obsessive-compulsive disorder, unspecified: Secondary | ICD-10-CM | POA: Diagnosis not present

## 2017-04-15 ENCOUNTER — Telehealth: Payer: Self-pay

## 2017-04-15 ENCOUNTER — Ambulatory Visit (INDEPENDENT_AMBULATORY_CARE_PROVIDER_SITE_OTHER): Payer: BLUE CROSS/BLUE SHIELD | Admitting: Neurology

## 2017-04-15 ENCOUNTER — Encounter: Payer: Self-pay | Admitting: Neurology

## 2017-04-15 VITALS — BP 100/62 | HR 57 | Temp 97.8°F | Wt 187.0 lb

## 2017-04-15 DIAGNOSIS — F411 Generalized anxiety disorder: Secondary | ICD-10-CM

## 2017-04-15 DIAGNOSIS — F0781 Postconcussional syndrome: Secondary | ICD-10-CM | POA: Diagnosis not present

## 2017-04-15 DIAGNOSIS — G44219 Episodic tension-type headache, not intractable: Secondary | ICD-10-CM

## 2017-04-15 DIAGNOSIS — H538 Other visual disturbances: Secondary | ICD-10-CM | POA: Diagnosis not present

## 2017-04-15 DIAGNOSIS — R42 Dizziness and giddiness: Secondary | ICD-10-CM
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
## 2017-04-15 NOTE — Patient Instructions (Signed)
1.  We will check MRI of brain without contrast 2.  I will refer you for neurocognitive testing. 3. To help reduce HEADACHES:  Coenzyme Q10 160mg  ONCE DAILY  Riboflavin/Vitamin B2 400mg  ONCE DAILY  Magnesium oxide 400mg  ONCE - TWICE DAILY  May stop after headaches are resolved.   4.  To help with INSOMNIA:  Melatonin 3-5mg  AT BEDTIME  Follow sleep hygiene sheet 5.  Other medicines to help decrease inflammation  Turmeric 500mg  twice daily 6.  Follow up in 3 months.

## 2017-04-15 NOTE — Telephone Encounter (Signed)
MRI does not meet medical necessity.  Please contact [phone] for further information.  Use member ID [id number].

## 2017-04-16 NOTE — Telephone Encounter (Signed)
Informed patients wife that MRI was denied by insurance and that it will be readdressed after neurocognitive testing.  She understands.

## 2017-04-16 NOTE — Telephone Encounter (Signed)
[place] Folk see what the neurocognitive testing shows first.

## 2017-04-23 ENCOUNTER — Telehealth: Payer: Self-pay | Admitting: Neurology

## 2017-04-23 NOTE — Telephone Encounter (Signed)
Caller: Patient's wife  Urgent? Yes  Reason for the call: Regarding an MRI.  Insurance said that it was denied but that they would approve an CT Scan. They said there was not enough information or notes to approve it. She was also asking about appointments with Dr. Si Raider? Please call. Thanks

## 2017-04-24 NOTE — Telephone Encounter (Signed)
Spoke with Marvin Espinoza regarding patient.  Advised her that Dr Tomi Likens is ok with holding off on imaging until neurocognitive testing results come back.  Wife is ok with this.

## 2017-04-25 ENCOUNTER — Other Ambulatory Visit: Payer: BLUE CROSS/BLUE SHIELD

## 2017-04-25 ENCOUNTER — Other Ambulatory Visit: Payer: Self-pay | Admitting: Physician Assistant

## 2017-04-25 DIAGNOSIS — E039 Hypothyroidism, unspecified: Secondary | ICD-10-CM

## 2017-04-25 DIAGNOSIS — Z79899 Other long term (current) drug therapy: Secondary | ICD-10-CM

## 2017-04-25 DIAGNOSIS — R5383 Other fatigue: Secondary | ICD-10-CM | POA: Diagnosis not present

## 2017-04-25 DIAGNOSIS — E559 Vitamin D deficiency, unspecified: Secondary | ICD-10-CM | POA: Diagnosis not present

## 2017-04-25 DIAGNOSIS — R739 Hyperglycemia, unspecified: Secondary | ICD-10-CM

## 2017-04-25 DIAGNOSIS — F4310 Post-traumatic stress disorder, unspecified: Secondary | ICD-10-CM

## 2017-04-25 DIAGNOSIS — Z0000 Encounter for general adult medical examination without abnormal findings: Secondary | ICD-10-CM

## 2017-04-25 LAB — BAYER DCA HB A1C WAIVED: HB A1C (BAYER DCA - WAIVED): 5.8 % (ref <7.0)

## 2017-04-26 LAB — CBC WITH DIFFERENTIAL/PLATELET
Basophils Absolute: 0.1 10*3/uL (ref 0.0–0.2)
Basos: 1 %
EOS (ABSOLUTE): 0.1 10*3/uL (ref 0.0–0.4)
Eos: 3 %
Hematocrit: 38.2 % (ref 37.5–51.0)
Hemoglobin: 12.6 g/dL — ABNORMAL LOW (ref 13.0–17.7)
Immature Grans (Abs): 0 10*3/uL (ref 0.0–0.1)
Immature Granulocytes: 0 %
Lymphocytes Absolute: 2.7 10*3/uL (ref 0.7–3.1)
Lymphs: 53 %
MCH: 28.8 pg (ref 26.6–33.0)
MCHC: 33 g/dL (ref 31.5–35.7)
MCV: 87 fL (ref 79–97)
Monocytes Absolute: 0.4 10*3/uL (ref 0.1–0.9)
Monocytes: 7 %
Neutrophils Absolute: 1.9 10*3/uL (ref 1.4–7.0)
Neutrophils: 36 %
Platelets: 236 10*3/uL (ref 150–379)
RBC: 4.37 x10E6/uL (ref 4.14–5.80)
RDW: 14.1 % (ref 12.3–15.4)
WBC: 5.1 10*3/uL (ref 3.4–10.8)

## 2017-04-26 LAB — CMP14+EGFR
ALT: 32 IU/L (ref 0–44)
AST: 28 IU/L (ref 0–40)
Albumin/Globulin Ratio: 1.7 (ref 1.2–2.2)
Albumin: 3.8 g/dL (ref 3.5–5.5)
Alkaline Phosphatase: 56 IU/L (ref 39–117)
BUN/Creatinine Ratio: 15 (ref 9–20)
BUN: 11 mg/dL (ref 6–24)
Bilirubin Total: 0.4 mg/dL (ref 0.0–1.2)
CO2: 24 mmol/L (ref 18–29)
Calcium: 8.9 mg/dL (ref 8.7–10.2)
Chloride: 106 mmol/L (ref 96–106)
Creatinine, Ser: 0.73 mg/dL — ABNORMAL LOW (ref 0.76–1.27)
GFR calc Af Amer: 128 mL/min/{1.73_m2} (ref >59)
GFR calc non Af Amer: 110 mL/min/{1.73_m2} (ref >59)
Globulin, Total: 2.2 g/dL (ref 1.5–4.5)
Glucose: 97 mg/dL (ref 65–99)
Potassium: 4.3 mmol/L (ref 3.5–5.2)
Sodium: 143 mmol/L (ref 134–144)
Total Protein: 6 g/dL (ref 6.0–8.5)

## 2017-04-26 LAB — LIPID PANEL
Chol/HDL Ratio: 2.4 ratio (ref 0.0–5.0)
Cholesterol, Total: 111 mg/dL (ref 100–199)
HDL: 47 mg/dL (ref >39)
LDL Calculated: 56 mg/dL (ref 0–99)
Triglycerides: 39 mg/dL (ref 0–149)
VLDL Cholesterol Cal: 8 mg/dL (ref 5–40)

## 2017-04-26 LAB — FOLATE: Folate: >20 ng/mL (ref >3.0)

## 2017-04-26 LAB — THYROID PANEL WITH TSH
Free Thyroxine Index: 1.9 (ref 1.2–4.9)
T3 Uptake Ratio: 30 % (ref 24–39)
T4, Total: 6.4 ug/dL (ref 4.5–12.0)
TSH: 3.07 u[IU]/mL (ref 0.450–4.500)

## 2017-04-26 LAB — VITAMIN D 25 HYDROXY (VIT D DEFICIENCY, FRACTURES): Vit D, 25-Hydroxy: 42.8 ng/mL (ref 30.0–100.0)

## 2017-04-26 LAB — VITAMIN B12: Vitamin B-12: >2000 pg/mL — ABNORMAL HIGH (ref 232–1245)

## 2017-04-30 ENCOUNTER — Encounter: Payer: Self-pay | Admitting: *Deleted

## 2017-05-01 DIAGNOSIS — F411 Generalized anxiety disorder: Secondary | ICD-10-CM | POA: Diagnosis not present

## 2017-05-01 DIAGNOSIS — F4310 Post-traumatic stress disorder, unspecified: Secondary | ICD-10-CM | POA: Diagnosis not present

## 2017-05-01 DIAGNOSIS — F3170 Bipolar disorder, currently in remission, most recent episode unspecified: Secondary | ICD-10-CM | POA: Diagnosis not present

## 2017-05-06 ENCOUNTER — Encounter: Payer: Self-pay | Admitting: Physician Assistant

## 2017-05-06 ENCOUNTER — Ambulatory Visit (INDEPENDENT_AMBULATORY_CARE_PROVIDER_SITE_OTHER): Payer: BLUE CROSS/BLUE SHIELD | Admitting: Physician Assistant

## 2017-05-06 VITALS — BP 117/76 | HR 66 | Temp 98.9°F | Ht 70.0 in | Wt 196.4 lb

## 2017-05-06 DIAGNOSIS — S0990XA Unspecified injury of head, initial encounter: Secondary | ICD-10-CM

## 2017-05-06 DIAGNOSIS — S0990XS Unspecified injury of head, sequela: Secondary | ICD-10-CM

## 2017-05-06 DIAGNOSIS — H539 Unspecified visual disturbance: Secondary | ICD-10-CM | POA: Diagnosis not present

## 2017-05-06 DIAGNOSIS — R42 Dizziness and giddiness: Secondary | ICD-10-CM

## 2017-05-06 HISTORY — DX: Unspecified injury of head, initial encounter: S09.90XA

## 2017-05-06 MED ORDER — MECLIZINE HCL 25 MG PO TABS: 25.0000 mg | ORAL_TABLET | Freq: Three times a day (TID) | ORAL | 2 refills | Status: DC | PRN

## 2017-05-06 NOTE — Progress Notes (Signed)
BP 117/76   Pulse 66   Temp 98.9 F (37.2 C) (Oral)   Ht '5\' 10"'  (1.778 m)   Wt 196 lb 6.4 oz (89.1 kg)   BMI 28.18 kg/m    Subjective:    Patient ID: Marvin Espinoza, male    DOB: 1971/09/30, 46 y.o.   MRN: [id number]  HPI: Marvin Espinoza is a 46 y.o. male presenting on 05/06/2017 for Dizziness  This patient comes in for periodic recheck on medications and conditions including dizziness, visual disturbances, headache, history of head injury.  He has still had recurrent dizziness, particularly when up on high ladders for his work. Discussed safety should be that he stay on the floor or low as possible ladder.  Neuro follow up is not until July..   All medications are reviewed today. There are no reports of any problems with the medications. All of the medical conditions are reviewed and updated.  Lab work is reviewed and will be ordered as medically necessary. There are no new problems reported with today's visit.   Relevant past medical, surgical, family and social history reviewed and updated as indicated. Allergies and medications reviewed and updated.  Past Medical History:  Diagnosis Date  . Anxiety   . Bipolar disorder ([place])   . CTS (carpal tunnel syndrome)   . Depression   . GERD (gastroesophageal reflux disease)   . H/O bariatric surgery   . Hypothyroidism   . Hypothyroidism   . Sleep apnea    uses CPAP nightly  . Sleep apnea    wears CPAP nightly    Past Surgical History:  Procedure Laterality Date  . ANAL FISSURE REPAIR    . BARIATRIC SURGERY    . CARPAL TUNNEL RELEASE Left 05/09/2015   Procedure: LEFT CARPAL TUNNEL RELEASE;  Surgeon: Daryll Brod, MD;  Location: [facility];  Service: Orthopedics;  Laterality: Left;  . CARPAL TUNNEL RELEASE Left 05-09-2015  . CARPAL TUNNEL RELEASE Right 06/20/2015   Procedure: RIGHT CARPAL TUNNEL RELEASE;  Surgeon: Daryll Brod, MD;  Location: [facility];  Service: Orthopedics;  Laterality: Right;   REGIONAL/FAB  . CHOLECYSTECTOMY      Review of Systems  Constitutional: Negative for appetite change and fatigue.  HENT: Negative.   Eyes: Negative.  Negative for pain and visual disturbance.  Respiratory: Negative.  Negative for cough, chest tightness, shortness of breath and wheezing.   Cardiovascular: Negative.  Negative for chest pain, palpitations and leg swelling.  Gastrointestinal: Negative.  Negative for abdominal pain, diarrhea, nausea and vomiting.  Endocrine: Negative.   Genitourinary: Negative.   Musculoskeletal: Negative.   Skin: Negative.  Negative for color change and rash.  Neurological: Positive for dizziness, syncope, weakness, light-headedness and headaches. Negative for tremors, seizures, speech difficulty and numbness.  Psychiatric/Behavioral: Negative.     Allergies as of 05/06/2017      Reactions   Abilify [aripiprazole] Anaphylaxis   Alcohol Itching   Drinking alcohol   Hydrocodone-acetaminophen Itching   Victoza [liraglutide] Other (See Comments)   Severe heartburn   Victoza [liraglutide]    Severe heartburn      Medication List       Accurate as of 05/06/17 11:30 AM. Always use your most recent med list.          ALPRAZolam 1 MG tablet Commonly known as:  XANAX Take 1 tablet (1 mg total) by mouth 3 (three) times daily as needed for anxiety.   Colchicine 0.6 MG Caps Take 0.6 mg  by mouth 2 (two) times daily.   cyclobenzaprine 10 MG tablet Commonly known as:  FLEXERIL Take 10 mg by mouth 3 (three) times daily as needed for muscle spasms.   lamoTRIgine 25 MG tablet Commonly known as:  LAMICTAL TAKE 1 TO 2 TABLETS TWICE DAILY   levothyroxine 100 MCG tablet Commonly known as:  SYNTHROID, LEVOTHROID TAKE ONE (1) TABLET EACH DAY   meclizine 25 MG tablet Commonly known as:  ANTIVERT Take 1 tablet (25 mg total) by mouth 3 (three) times daily as needed for dizziness.   omeprazole 40 MG capsule Commonly known as:  PRILOSEC TAKE ONE (1)  CAPSULE EACH DAY   promethazine 25 MG tablet Commonly known as:  PHENERGAN Take 1 tablet (25 mg total) by mouth every 6 (six) hours as needed for nausea or vomiting.   traMADol 50 MG tablet Commonly known as:  ULTRAM Take 1 tablet (50 mg total) by mouth every 6 (six) hours as needed.   traZODone 100 MG tablet Commonly known as:  DESYREL Take 1-2 tablets (100-200 mg total) by mouth at bedtime as needed for sleep.   VRAYLAR 1.5 MG Caps Generic drug:  Cariprazine HCl Take 1.5 mg by mouth.          Objective:    BP 117/76   Pulse 66   Temp 98.9 F (37.2 C) (Oral)   Ht '5\' 10"'  (1.778 m)   Wt 196 lb 6.4 oz (89.1 kg)   BMI 28.18 kg/m   Allergies  Allergen Reactions  . Abilify [Aripiprazole] Anaphylaxis  . Alcohol Itching    Drinking alcohol  . Hydrocodone-Acetaminophen Itching  . Victoza [Liraglutide] Other (See Comments)    Severe heartburn   . Victoza [Liraglutide]     Severe heartburn    Physical Exam  Constitutional: He is oriented to person, place, and time. He appears well-developed and well-nourished.  HENT:  Head: Normocephalic and atraumatic.  Eyes: Conjunctivae and EOM are normal. Pupils are equal, round, and reactive to light.  Neck: Normal range of motion. Neck supple.  Cardiovascular: Normal rate, regular rhythm and normal heart sounds.   Pulmonary/Chest: Effort normal and breath sounds normal.  Abdominal: Soft. Bowel sounds are normal.  Musculoskeletal: Normal range of motion.  Neurological: He is alert and oriented to person, place, and time. He has normal strength and normal reflexes. He is not disoriented. No cranial nerve deficit or sensory deficit. Coordination and gait abnormal.  Skin: Skin is warm and dry.    Results for orders placed or performed in visit on 04/25/17  Lipid panel  Result Value Ref Range   Cholesterol, Total 111 100 - 199 mg/dL   Triglycerides 39 0 - 149 mg/dL   HDL 47 >39 mg/dL   VLDL Cholesterol Cal 8 5 - 40 mg/dL   LDL  Calculated 56 0 - 99 mg/dL   Chol/HDL Ratio 2.4 0.0 - 5.0 ratio  Bayer DCA Hb A1c Waived  Result Value Ref Range   Bayer DCA Hb A1c Waived 5.8 <7.0 %  CBC with Differential/Platelet  Result Value Ref Range   WBC 5.1 3.4 - 10.8 x10E3/uL   RBC 4.37 4.14 - 5.80 x10E6/uL   Hemoglobin 12.6 (L) 13.0 - 17.7 g/dL   Hematocrit 38.2 37.5 - 51.0 %   MCV 87 79 - 97 fL   MCH 28.8 26.6 - 33.0 pg   MCHC 33.0 31.5 - 35.7 g/dL   RDW 14.1 12.3 - 15.4 %   Platelets 236 150 - 379  x10E3/uL   Neutrophils 36 Not Estab. %   Lymphs 53 Not Estab. %   Monocytes 7 Not Estab. %   Eos 3 Not Estab. %   Basos 1 Not Estab. %   Neutrophils Absolute 1.9 1.4 - 7.0 x10E3/uL   Lymphocytes Absolute 2.7 0.7 - 3.1 x10E3/uL   Monocytes Absolute 0.4 0.1 - 0.9 x10E3/uL   EOS (ABSOLUTE) 0.1 0.0 - 0.4 x10E3/uL   Basophils Absolute 0.1 0.0 - 0.2 x10E3/uL   Immature Granulocytes 0 Not Estab. %   Immature Grans (Abs) 0.0 0.0 - 0.1 x10E3/uL  Folate  Result Value Ref Range   Folate >20.0 >3.0 ng/mL  Vitamin B12  Result Value Ref Range   Vitamin B-12 >2000 (H) 232 - 1245 pg/mL  VITAMIN D 25 Hydroxy (Vit-D Deficiency, Fractures)  Result Value Ref Range   Vit D, 25-Hydroxy 42.8 30.0 - 100.0 ng/mL  CMP14+EGFR  Result Value Ref Range   Glucose 97 65 - 99 mg/dL   BUN 11 6 - 24 mg/dL   Creatinine, Ser 0.73 (L) 0.76 - 1.27 mg/dL   GFR calc non Af Amer 110 >59 mL/min/1.73   GFR calc Af Amer 128 >59 mL/min/1.73   BUN/Creatinine Ratio 15 9 - 20   Sodium 143 134 - 144 mmol/L   Potassium 4.3 3.5 - 5.2 mmol/L   Chloride 106 96 - 106 mmol/L   CO2 24 18 - 29 mmol/L   Calcium 8.9 8.7 - 10.2 mg/dL   Total Protein 6.0 6.0 - 8.5 g/dL   Albumin 3.8 3.5 - 5.5 g/dL   Globulin, Total 2.2 1.5 - 4.5 g/dL   Albumin/Globulin Ratio 1.7 1.2 - 2.2   Bilirubin Total 0.4 0.0 - 1.2 mg/dL   Alkaline Phosphatase 56 39 - 117 IU/L   AST 28 0 - 40 IU/L   ALT 32 0 - 44 IU/L  Thyroid Panel With TSH  Result Value Ref Range   TSH 3.070 0.450 - 4.500  uIU/mL   T4, Total 6.4 4.5 - 12.0 ug/dL   T3 Uptake Ratio 30 24 - 39 %   Free Thyroxine Index 1.9 1.2 - 4.9      Assessment & Plan:   1. Injury of head, sequela - CT Head Wo Contrast; Future - meclizine (ANTIVERT) 25 MG tablet; Take 1 tablet (25 mg total) by mouth 3 (three) times daily as needed for dizziness.  Dispense: 90 tablet; Refill: 2  2. Dizziness - CT Head Wo Contrast; Future - meclizine (ANTIVERT) 25 MG tablet; Take 1 tablet (25 mg total) by mouth 3 (three) times daily as needed for dizziness.  Dispense: 90 tablet; Refill: 2  3. Vision disturbance - CT Head Wo Contrast; Future   Current Outpatient Prescriptions:  .  ALPRAZolam (XANAX) 1 MG tablet, Take 1 tablet (1 mg total) by mouth 3 (three) times daily as needed for anxiety., Disp: 30 tablet, Rfl: 5 .  Colchicine 0.6 MG CAPS, Take 0.6 mg by mouth 2 (two) times daily., Disp: 40 capsule, Rfl: 2 .  cyclobenzaprine (FLEXERIL) 10 MG tablet, Take 10 mg by mouth 3 (three) times daily as needed for muscle spasms., Disp: , Rfl:  .  lamoTRIgine (LAMICTAL) 25 MG tablet, TAKE 1 TO 2 TABLETS TWICE DAILY, Disp: 120 tablet, Rfl: 5 .  levothyroxine (SYNTHROID, LEVOTHROID) 100 MCG tablet, TAKE ONE (1) TABLET EACH DAY, Disp: 30 tablet, Rfl: 9 .  omeprazole (PRILOSEC) 40 MG capsule, TAKE ONE (1) CAPSULE EACH DAY, Disp: 30 capsule, Rfl: 2 .  promethazine (PHENERGAN) 25 MG tablet, Take 1 tablet (25 mg total) by mouth every 6 (six) hours as needed for nausea or vomiting., Disp: 60 tablet, Rfl: 2 .  traMADol (ULTRAM) 50 MG tablet, Take 1 tablet (50 mg total) by mouth every 6 (six) hours as needed., Disp: 120 tablet, Rfl: 5 .  VRAYLAR 1.5 MG CAPS, Take 1.5 mg by mouth., Disp: , Rfl: 0 .  meclizine (ANTIVERT) 25 MG tablet, Take 1 tablet (25 mg total) by mouth 3 (three) times daily as needed for dizziness., Disp: 90 tablet, Rfl: 2 .  traZODone (DESYREL) 100 MG tablet, Take 1-2 tablets (100-200 mg total) by mouth at bedtime as needed for sleep.  (Patient not taking: Reported on 05/06/2017), Disp: 60 tablet, Rfl: 2  Continue all other maintenance medications as listed above.  Follow up plan: Return in about 4 weeks (around 06/03/2017) for recheck.  Educational handout given for post concussion syndrome  Terald Sleeper PA-C [facility] [address]  [place], [place] [address] [phone]   05/06/2017, 11:30 AM

## 2017-05-06 NOTE — Patient Instructions (Signed)
Post-Concussion Syndrome Post-concussion syndrome is the symptoms that can occur after a head injury. These symptoms can last from weeks to months. Follow these instructions at home:  Take medicines only as told by your doctor.  Do not take aspirin.  Sleep with your head raised to help with headaches.  Avoid activities that can cause another head injury.  Do not play contact sports like football, hockey, soccer, or basketball.  Do not do other risky activities like downhill skiing, martial arts, or horseback riding until your doctor says it is okay.  Keep all follow-up visits as told by your doctor. This is important. Contact a doctor if:  You have a harder time:  Paying attention.  Focusing.  Remembering.  Learning new information.  Dealing with stress.  You need more time to complete tasks.  You are easily bothered (irritable).  You have more symptoms. Get help if you have any of these symptoms for more than two weeks after your injury:  Long-lasting (chronic) headaches.  Dizziness.  Trouble balancing.  Feeling sick to your stomach (nauseous).  Trouble with your vision.  Noise or light bothers you more.  Depression.  Mood swings.  Feeling worried (anxious).  Easily bothered.  Memory problems.  Trouble concentrating or paying attention.  Sleep problems.  Feeling tired all of the time. Get help right away if:  You feel confused.  You feel very sleepy.  You are hard to wake up.  You feel sick to your stomach.  You keep throwing up (vomiting).  You feel like you are moving when you are not (vertigo).  Your eyes move back and forth very quickly.  You start shaking (convulsing) or pass out (faint).  You have very bad headaches that do not get better with medicine.  You cannot use your arms or legs like normal.  One of the black centers of your eyes (pupils) is bigger than the other.  You have clear or bloody fluid coming from your  nose or ears.  Your problems get worse, not better. This information is not intended to replace advice given to you by your health care provider. Make sure you discuss any questions you have with your health care provider. Document Released: 01/09/2005 Document Revised: 05/09/2016 Document Reviewed: 03/09/2014 Elsevier Interactive Patient Education  2017 [employer].

## 2017-05-09 ENCOUNTER — Emergency Department (HOSPITAL_COMMUNITY)
Admission: EM | Admit: 2017-05-09 | Discharge: 2017-05-10 | Disposition: A | Discharge status: Other Acute Inpatient Hospital | Payer: BLUE CROSS/BLUE SHIELD | Attending: Emergency Medicine | Admitting: Emergency Medicine

## 2017-05-09 DIAGNOSIS — T424X2A Poisoning by benzodiazepines, intentional self-harm, initial encounter: Secondary | ICD-10-CM | POA: Diagnosis not present

## 2017-05-09 DIAGNOSIS — T50902A Poisoning by unspecified drugs, medicaments and biological substances, intentional self-harm, initial encounter: Secondary | ICD-10-CM

## 2017-05-09 DIAGNOSIS — T1491XA Suicide attempt, initial encounter: Secondary | ICD-10-CM | POA: Diagnosis not present

## 2017-05-09 DIAGNOSIS — E039 Hypothyroidism, unspecified: Secondary | ICD-10-CM | POA: Diagnosis not present

## 2017-05-09 DIAGNOSIS — Z79899 Other long term (current) drug therapy: Secondary | ICD-10-CM | POA: Diagnosis not present

## 2017-05-09 DIAGNOSIS — Y69 Unspecified misadventure during surgical and medical care: Secondary | ICD-10-CM | POA: Insufficient documentation

## 2017-05-09 LAB — COMPREHENSIVE METABOLIC PANEL
ALT: 31 U/L (ref 17–63)
AST: 27 U/L (ref 15–41)
Albumin: 3.3 g/dL — ABNORMAL LOW (ref 3.5–5.0)
Alkaline Phosphatase: 48 U/L (ref 38–126)
Anion gap: 3 — ABNORMAL LOW (ref 5–15)
BUN: 12 mg/dL (ref 6–20)
CO2: 25 mmol/L (ref 22–32)
Calcium: 8.1 mg/dL — ABNORMAL LOW (ref 8.9–10.3)
Chloride: 113 mmol/L — ABNORMAL HIGH (ref 101–111)
Creatinine, Ser: 0.8 mg/dL (ref 0.61–1.24)
GFR calc Af Amer: >60 mL/min (ref >60)
GFR calc non Af Amer: >60 mL/min (ref >60)
Glucose, Bld: 127 mg/dL — ABNORMAL HIGH (ref 65–99)
Potassium: 3.3 mmol/L — ABNORMAL LOW (ref 3.5–5.1)
Sodium: 141 mmol/L (ref 135–145)
Total Bilirubin: 0.2 mg/dL — ABNORMAL LOW (ref 0.3–1.2)
Total Protein: 5.4 g/dL — ABNORMAL LOW (ref 6.5–8.1)

## 2017-05-09 LAB — CBC
HCT: 33.4 % — ABNORMAL LOW (ref 39.0–52.0)
Hemoglobin: 11.2 g/dL — ABNORMAL LOW (ref 13.0–17.0)
MCH: 28.6 pg (ref 26.0–34.0)
MCHC: 33.5 g/dL (ref 30.0–36.0)
MCV: 85.4 fL (ref 78.0–100.0)
Platelets: 189 10*3/uL (ref 150–400)
RBC: 3.91 MIL/uL — ABNORMAL LOW (ref 4.22–5.81)
RDW: 13.4 % (ref 11.5–15.5)
WBC: 5.7 10*3/uL (ref 4.0–10.5)

## 2017-05-09 LAB — ETHANOL: Alcohol, Ethyl (B): <5 mg/dL (ref <5)

## 2017-05-09 LAB — SALICYLATE LEVEL: Salicylate Lvl: <7 mg/dL (ref 2.8–30.0)

## 2017-05-09 LAB — ACETAMINOPHEN LEVEL: Acetaminophen (Tylenol), Serum: <10 ug/mL — ABNORMAL LOW (ref 10–30)

## 2017-05-09 MED ORDER — SODIUM CHLORIDE 0.9 % IV BOLUS (SEPSIS)
1000.0000 mL | Freq: Once | INTRAVENOUS | Status: AC
  Administered 2017-05-09: 1000 mL via INTRAVENOUS

## 2017-05-09 NOTE — ED Triage Notes (Signed)
Pt arrived via EMS from home for overdose on 10 xanax. Per ems pt ingested at 1730; states poison control recommendation to give fluids and tx for hypotension and bradycardia. Pt bp 101/61 and pulse 65 on arrival to ED; resp e/u; pt lethargic but arrousable, oriented to time, person, and situation. Wife at bedside.

## 2017-05-09 NOTE — ED Provider Notes (Signed)
[facility] DEPT Provider Note   CSN: [id number] Arrival date & time: 05/09/17  2006     History   Chief Complaint Chief Complaint  Patient presents with  . Ingestion    HPI Marvin Espinoza is a 46 y.o. male.  Patient with hx anxiety, presents s/p intentional overdose of xanax.  Per ems report, pt took 10 of his xanax tablets at approximately 5:30 PM today.  Spouse indicates he had been very stressed about money issues.  She indicates previously he will occasionally take up to 2 or 3 of his xanax at once when stressed.  Hx depression, although spouse reports no acute worsening of depression.  Pt had voiced wanting to rest, but no reported thoughts of depression or self harm.  Pt not verbally responsive - level 5 caveat.    The history is provided by the patient, the EMS personnel and the spouse. The history is limited by the condition of the patient.  Ingestion     Past Medical History:  Diagnosis Date  . Anxiety   . Bipolar disorder ([place])   . CTS (carpal tunnel syndrome)   . Depression   . GERD (gastroesophageal reflux disease)   . H/O bariatric surgery   . Hypothyroidism   . Hypothyroidism   . Sleep apnea    uses CPAP nightly  . Sleep apnea    wears CPAP nightly    Patient Active Problem List   Diagnosis Date Noted  . Head injury 05/06/2017  . Dizziness 05/06/2017  . Vision disturbance 05/06/2017  . History of concussion 02/10/2017  . Post concussion syndrome 02/10/2017  . Mood disorder ([place]) 02/10/2017  . Adjustment insomnia 01/24/2017  . Adjustment disorder with anxious mood 01/24/2017  . GAD (generalized anxiety disorder) 01/24/2017  . Gastroesophageal reflux disease without esophagitis 11/19/2016  . Hypothyroidism 11/19/2016  . DDD (degenerative disc disease), lumbar 11/19/2016    Past Surgical History:  Procedure Laterality Date  . ANAL FISSURE REPAIR    . BARIATRIC SURGERY    . CARPAL TUNNEL RELEASE Left 05/09/2015   Procedure: LEFT CARPAL  TUNNEL RELEASE;  Surgeon: Daryll Brod, MD;  Location: [facility];  Service: Orthopedics;  Laterality: Left;  . CARPAL TUNNEL RELEASE Left 05-09-2015  . CARPAL TUNNEL RELEASE Right 06/20/2015   Procedure: RIGHT CARPAL TUNNEL RELEASE;  Surgeon: Daryll Brod, MD;  Location: [facility];  Service: Orthopedics;  Laterality: Right;  REGIONAL/FAB  . CHOLECYSTECTOMY         Home Medications    Prior to Admission medications   Medication Sig Start Date End Date Taking? Authorizing Provider  ALPRAZolam Duanne Moron) 1 MG tablet Take 1 tablet (1 mg total) by mouth 3 (three) times daily as needed for anxiety. 11/19/16   Terald Sleeper, PA-C  Colchicine 0.6 MG CAPS Take 0.6 mg by mouth 2 (two) times daily. 02/03/17   Terald Sleeper, PA-C  cyclobenzaprine (FLEXERIL) 10 MG tablet Take 10 mg by mouth 3 (three) times daily as needed for muscle spasms.    [provider]  lamoTRIgine (LAMICTAL) 25 MG tablet TAKE 1 TO 2 TABLETS TWICE DAILY 02/24/17   Terald Sleeper, PA-C  levothyroxine (SYNTHROID, LEVOTHROID) 100 MCG tablet TAKE ONE (1) TABLET EACH DAY 02/04/17   Terald Sleeper, PA-C  meclizine (ANTIVERT) 25 MG tablet Take 1 tablet (25 mg total) by mouth 3 (three) times daily as needed for dizziness. 05/06/17   Terald Sleeper, PA-C  omeprazole (PRILOSEC) 40 MG capsule TAKE  ONE (1) CAPSULE EACH DAY 03/10/17   Terald Sleeper, PA-C  promethazine (PHENERGAN) 25 MG tablet Take 1 tablet (25 mg total) by mouth every 6 (six) hours as needed for nausea or vomiting. 11/19/16   Terald Sleeper, PA-C  traMADol (ULTRAM) 50 MG tablet Take 1 tablet (50 mg total) by mouth every 6 (six) hours as needed. 11/19/16   Terald Sleeper, PA-C  traZODone (DESYREL) 100 MG tablet Take 1-2 tablets (100-200 mg total) by mouth at bedtime as needed for sleep. Patient not taking: Reported on 05/06/2017 01/24/17   Terald Sleeper, PA-C  VRAYLAR 1.5 MG CAPS Take 1.5 mg by mouth. 04/08/17   [provider]    Family  History Family History  Problem Relation Age of Onset  . COPD Mother   . Diabetes Mother   . Heart disease Mother   . Depression Mother   . COPD Father   . Heart disease Father   . Kidney disease Father   . Mental illness Father   . Depression Father   . Mental illness Sister   . Depression Sister     Social History Social History  Substance Use Topics  . Smoking status: Never Smoker  . Smokeless tobacco: Never Used  . Alcohol use No     Allergies   Abilify [aripiprazole]; Alcohol; Hydrocodone-acetaminophen; Victoza [liraglutide]; and Victoza [liraglutide]   Review of Systems Review of Systems  Unable to perform ROS: Patient unresponsive  level 5 caveat.    Physical Exam Updated Vital Signs BP 100/67 (BP Location: Right Arm)   Pulse 70   Temp 97.4 F (36.3 C) (Oral)   Resp 18   SpO2 100%   Physical Exam  Constitutional: He appears well-developed and well-nourished.  HENT:  Mouth/Throat: Oropharynx is clear and moist.  Eyes: Conjunctivae are normal. Pupils are equal, round, and reactive to light.  Neck: Neck supple. No tracheal deviation present.  Cardiovascular: Normal rate, regular rhythm, normal heart sounds and intact distal pulses.   Pulmonary/Chest: Effort normal and breath sounds normal. No accessory muscle usage. No respiratory distress.  Abdominal: Soft. He exhibits no distension. There is no tenderness.  Musculoskeletal: He exhibits no edema.  Neurological:  Very drowsy, arousable.   Skin: Skin is warm and dry.  Psychiatric:  Very drowsy, slow to respond.   Nursing note and vitals reviewed.    ED Treatments / Results  Labs (all labs ordered are listed, but only abnormal results are displayed) Results for orders placed or performed during the hospital encounter of 05/09/17  Acetaminophen level  Result Value Ref Range   Acetaminophen (Tylenol), Serum <10 (L) 10 - 30 ug/mL  Salicylate level  Result Value Ref Range   Salicylate Lvl <5.4 2.8  - 30.0 mg/dL  Ethanol  Result Value Ref Range   Alcohol, Ethyl (B) <5 <5 mg/dL  Comprehensive metabolic panel  Result Value Ref Range   Sodium 141 135 - 145 mmol/L   Potassium 3.3 (L) 3.5 - 5.1 mmol/L   Chloride 113 (H) 101 - 111 mmol/L   CO2 25 22 - 32 mmol/L   Glucose, Bld 127 (H) 65 - 99 mg/dL   BUN 12 6 - 20 mg/dL   Creatinine, Ser 0.80 0.61 - 1.24 mg/dL   Calcium 8.1 (L) 8.9 - 10.3 mg/dL   Total Protein 5.4 (L) 6.5 - 8.1 g/dL   Albumin 3.3 (L) 3.5 - 5.0 g/dL   AST 27 15 - 41 U/L   ALT 31  17 - 63 U/L   Alkaline Phosphatase 48 38 - 126 U/L   Total Bilirubin 0.2 (L) 0.3 - 1.2 mg/dL   GFR calc non Af Amer >60 >60 mL/min   GFR calc Af Amer >60 >60 mL/min   Anion gap 3 (L) 5 - 15  CBC  Result Value Ref Range   WBC 5.7 4.0 - 10.5 K/uL   RBC 3.91 (L) 4.22 - 5.81 MIL/uL   Hemoglobin 11.2 (L) 13.0 - 17.0 g/dL   HCT 33.4 (L) 39.0 - 52.0 %   MCV 85.4 78.0 - 100.0 fL   MCH 28.6 26.0 - 34.0 pg   MCHC 33.5 30.0 - 36.0 g/dL   RDW 13.4 11.5 - 15.5 %   Platelets 189 150 - 400 K/uL    EKG  EKG Interpretation None       Radiology No results found.  Procedures Procedures (including critical care time)  Medications Ordered in ED Medications  sodium chloride 0.9 % bolus 1,000 mL (not administered)     Initial Impression / Assessment and Plan / ED Course  I have reviewed the triage vital signs and the nursing notes.  Pertinent labs & imaging results that were available during my care of the patient were reviewed by me and considered in my medical decision making (see chart for details).  Iv ns. Monitor. Continuous pulse ox.   Labs.  Reviewed nursing notes and prior charts for additional history.   [facility] team consulted.   Recheck pt, drowsy but arousable. No resp depression.    Awaiting [facility] team eval.  2330, o2 sats 97%, rr 14.   Awaiting bh eval. Signed out to Dr Kathrynn Humble to f/u [facility] eval - dispo per [facility] team.    Final Clinical Impressions(s) / ED Diagnoses    Final diagnoses:  None    New Prescriptions New Prescriptions   No medications on file     Lajean Saver, MD 05/09/17 [id number]

## 2017-05-10 ENCOUNTER — Inpatient Hospital Stay (HOSPITAL_COMMUNITY)
Admission: AD | Admit: 2017-05-10 | Discharge: 2017-05-15 | DRG: 885 | Disposition: A | Discharge status: Home / Self Care | Payer: BLUE CROSS/BLUE SHIELD | Source: Intra-hospital | Attending: Psychiatry | Admitting: Psychiatry

## 2017-05-10 ENCOUNTER — Encounter (HOSPITAL_COMMUNITY): Payer: Self-pay

## 2017-05-10 DIAGNOSIS — F4310 Post-traumatic stress disorder, unspecified: Secondary | ICD-10-CM | POA: Diagnosis present

## 2017-05-10 DIAGNOSIS — Y69 Unspecified misadventure during surgical and medical care: Secondary | ICD-10-CM | POA: Diagnosis not present

## 2017-05-10 DIAGNOSIS — F5102 Adjustment insomnia: Secondary | ICD-10-CM | POA: Diagnosis present

## 2017-05-10 DIAGNOSIS — S0990XA Unspecified injury of head, initial encounter: Secondary | ICD-10-CM | POA: Diagnosis not present

## 2017-05-10 DIAGNOSIS — F319 Bipolar disorder, unspecified: Secondary | ICD-10-CM | POA: Diagnosis present

## 2017-05-10 DIAGNOSIS — F411 Generalized anxiety disorder: Secondary | ICD-10-CM | POA: Diagnosis present

## 2017-05-10 DIAGNOSIS — T1491XA Suicide attempt, initial encounter: Secondary | ICD-10-CM | POA: Diagnosis not present

## 2017-05-10 DIAGNOSIS — Z888 Allergy status to other drugs, medicaments and biological substances status: Secondary | ICD-10-CM

## 2017-05-10 DIAGNOSIS — T424X2D Poisoning by benzodiazepines, intentional self-harm, subsequent encounter: Secondary | ICD-10-CM | POA: Diagnosis not present

## 2017-05-10 DIAGNOSIS — H539 Unspecified visual disturbance: Secondary | ICD-10-CM | POA: Diagnosis not present

## 2017-05-10 DIAGNOSIS — Z87820 Personal history of traumatic brain injury: Secondary | ICD-10-CM

## 2017-05-10 DIAGNOSIS — R42 Dizziness and giddiness: Secondary | ICD-10-CM | POA: Diagnosis not present

## 2017-05-10 DIAGNOSIS — K219 Gastro-esophageal reflux disease without esophagitis: Secondary | ICD-10-CM | POA: Diagnosis present

## 2017-05-10 DIAGNOSIS — T424X2A Poisoning by benzodiazepines, intentional self-harm, initial encounter: Secondary | ICD-10-CM | POA: Diagnosis not present

## 2017-05-10 DIAGNOSIS — K5900 Constipation, unspecified: Secondary | ICD-10-CM | POA: Diagnosis not present

## 2017-05-10 DIAGNOSIS — Z885 Allergy status to narcotic agent status: Secondary | ICD-10-CM | POA: Diagnosis not present

## 2017-05-10 DIAGNOSIS — Z79899 Other long term (current) drug therapy: Secondary | ICD-10-CM | POA: Diagnosis not present

## 2017-05-10 DIAGNOSIS — Z818 Family history of other mental and behavioral disorders: Secondary | ICD-10-CM | POA: Diagnosis not present

## 2017-05-10 DIAGNOSIS — Z9884 Bariatric surgery status: Secondary | ICD-10-CM | POA: Diagnosis not present

## 2017-05-10 DIAGNOSIS — G4700 Insomnia, unspecified: Secondary | ICD-10-CM | POA: Diagnosis not present

## 2017-05-10 DIAGNOSIS — E039 Hypothyroidism, unspecified: Secondary | ICD-10-CM | POA: Diagnosis present

## 2017-05-10 DIAGNOSIS — F322 Major depressive disorder, single episode, severe without psychotic features: Secondary | ICD-10-CM | POA: Diagnosis not present

## 2017-05-10 DIAGNOSIS — Z915 Personal history of self-harm: Secondary | ICD-10-CM

## 2017-05-10 DIAGNOSIS — F332 Major depressive disorder, recurrent severe without psychotic features: Secondary | ICD-10-CM | POA: Diagnosis not present

## 2017-05-10 DIAGNOSIS — G4730 Sleep apnea, unspecified: Secondary | ICD-10-CM | POA: Diagnosis present

## 2017-05-10 DIAGNOSIS — Z91048 Other nonmedicinal substance allergy status: Secondary | ICD-10-CM | POA: Diagnosis not present

## 2017-05-10 DIAGNOSIS — F17210 Nicotine dependence, cigarettes, uncomplicated: Secondary | ICD-10-CM | POA: Diagnosis not present

## 2017-05-10 DIAGNOSIS — F410 Panic disorder [episodic paroxysmal anxiety] without agoraphobia: Secondary | ICD-10-CM | POA: Diagnosis not present

## 2017-05-10 LAB — RAPID URINE DRUG SCREEN, HOSP PERFORMED
Amphetamines: NOT DETECTED
Barbiturates: NOT DETECTED
Benzodiazepines: POSITIVE — AB
Cocaine: NOT DETECTED
Opiates: NOT DETECTED
Tetrahydrocannabinol: NOT DETECTED

## 2017-05-10 MED ORDER — CYCLOBENZAPRINE HCL 10 MG PO TABS: 10.0000 mg | ORAL_TABLET | Freq: Three times a day (TID) | ORAL | Status: DC | PRN

## 2017-05-10 MED ORDER — HYDROXYZINE HCL 25 MG PO TABS
25.0000 mg | ORAL_TABLET | Freq: Four times a day (QID) | ORAL | Status: DC | PRN
  Administered 2017-05-10 – 2017-05-13 (×3): 25 mg via ORAL
  Filled 2017-05-10 (×3): qty 1

## 2017-05-10 MED ORDER — CYCLOBENZAPRINE HCL 10 MG PO TABS
10.0000 mg | ORAL_TABLET | Freq: Three times a day (TID) | ORAL | Status: DC | PRN
  Administered 2017-05-10 – 2017-05-11 (×2): 10 mg via ORAL
  Filled 2017-05-10 (×2): qty 1

## 2017-05-10 MED ORDER — ALPRAZOLAM 0.5 MG PO TABS
1.0000 mg | ORAL_TABLET | Freq: Three times a day (TID) | ORAL | Status: DC | PRN
  Administered 2017-05-10: 1 mg via ORAL
  Filled 2017-05-10: qty 4

## 2017-05-10 MED ORDER — MAGNESIUM HYDROXIDE 400 MG/5ML PO SUSP: 30.0000 mL | Freq: Every day | ORAL | Status: DC | PRN

## 2017-05-10 MED ORDER — LAMOTRIGINE 25 MG PO TABS
50.0000 mg | ORAL_TABLET | Freq: Two times a day (BID) | ORAL | Status: DC
  Administered 2017-05-10: 50 mg via ORAL
  Filled 2017-05-10: qty 2

## 2017-05-10 MED ORDER — TRAZODONE HCL 50 MG PO TABS
50.0000 mg | ORAL_TABLET | Freq: Every evening | ORAL | Status: DC | PRN
  Administered 2017-05-10: 50 mg via ORAL
  Filled 2017-05-10: qty 1

## 2017-05-10 MED ORDER — ALPRAZOLAM 1 MG PO TABS: 1.0000 mg | ORAL_TABLET | Freq: Three times a day (TID) | ORAL | Status: DC | PRN

## 2017-05-10 MED ORDER — ALUM & MAG HYDROXIDE-SIMETH 200-200-20 MG/5ML PO SUSP: 30.0000 mL | ORAL | Status: DC | PRN

## 2017-05-10 MED ORDER — PANTOPRAZOLE SODIUM 40 MG PO TBEC
40.0000 mg | DELAYED_RELEASE_TABLET | Freq: Every day | ORAL | Status: DC
  Administered 2017-05-11 – 2017-05-15 (×5): 40 mg via ORAL
  Filled 2017-05-10 (×8): qty 1

## 2017-05-10 MED ORDER — ACETAMINOPHEN 325 MG PO TABS: 650.0000 mg | ORAL_TABLET | Freq: Four times a day (QID) | ORAL | Status: DC | PRN

## 2017-05-10 MED ORDER — LEVOTHYROXINE SODIUM 100 MCG PO TABS
100.0000 ug | ORAL_TABLET | Freq: Every day | ORAL | Status: DC
  Administered 2017-05-11 – 2017-05-15 (×5): 100 ug via ORAL
  Filled 2017-05-10 (×8): qty 1

## 2017-05-10 MED ORDER — POTASSIUM CHLORIDE CRYS ER 20 MEQ PO TBCR
40.0000 meq | EXTENDED_RELEASE_TABLET | Freq: Once | ORAL | Status: AC
  Administered 2017-05-10: 40 meq via ORAL
  Filled 2017-05-10: qty 2

## 2017-05-10 MED ORDER — LEVOTHYROXINE SODIUM 100 MCG PO TABS
100.0000 ug | ORAL_TABLET | Freq: Every day | ORAL | Status: DC
  Administered 2017-05-10: 100 ug via ORAL
  Filled 2017-05-10: qty 1

## 2017-05-10 MED ORDER — PANTOPRAZOLE SODIUM 40 MG PO TBEC
40.0000 mg | DELAYED_RELEASE_TABLET | Freq: Every day | ORAL | Status: DC
  Administered 2017-05-10: 40 mg via ORAL
  Filled 2017-05-10: qty 1

## 2017-05-10 MED ORDER — LAMOTRIGINE 25 MG PO TABS
50.0000 mg | ORAL_TABLET | Freq: Two times a day (BID) | ORAL | Status: DC
  Administered 2017-05-11 – 2017-05-15 (×9): 50 mg via ORAL
  Filled 2017-05-10 (×15): qty 2

## 2017-05-10 NOTE — ED Notes (Signed)
Patient didn't eat Dinner.

## 2017-05-10 NOTE — ED Notes (Signed)
Patient was given a Snack and drink, and a Regular Diet ordered for Dinner.

## 2017-05-10 NOTE — ED Notes (Signed)
Lying on bed, alert. Advised pt his wife just called and advised she will be visiting at 1730 as he has not yet been transported to [facility]. Pt voiced understanding.
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
## 2017-05-10 NOTE — ED Notes (Signed)
Regular Diet has been ordered for Lunch. 
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
## 2017-05-10 NOTE — Tx Team (Signed)
Initial Treatment Plan 05/10/2017 10:43 PM Marvin Espinoza CWC:[id number]    PATIENT STRESSORS: Financial difficulties Medication change or noncompliance   PATIENT STRENGTHS: Average or above average intelligence Capable of independent living General fund of knowledge   PATIENT IDENTIFIED PROBLEMS: "I want to quit worrying so much"  "need help with my depression"                   DISCHARGE CRITERIA:  Improved stabilization in mood, thinking, and/or behavior Verbal commitment to aftercare and medication compliance Withdrawal symptoms are absent or subacute and managed without 24-hour nursing intervention  PRELIMINARY DISCHARGE PLAN: Attend aftercare/continuing care group Return to previous living arrangement Return to previous work or school arrangements  PATIENT/FAMILY INVOLVEMENT: This treatment plan has been presented to and reviewed with the patient, Marvin Espinoza, and/or family member, .  The patient and family have been given the opportunity to ask questions and make suggestions.  Migdalia Dk, RN 05/10/2017, 10:43 PM

## 2017-05-10 NOTE — ED Provider Notes (Signed)
Vitals:   05/10/17 1151 05/10/17 1734  BP: 111/63 (!) 108/56  Pulse: (!) 59 (!) 59  Resp: 18 18  Temp: 97.9 F (36.6 C) 98.4 F (36.9 C)   Vitals are stable.  Accepted at [facility] for transfer.  Mild anemia noted on labs.  Will need outpatient follow up   Dorie Rank, MD 05/10/17 2011

## 2017-05-10 NOTE — ED Notes (Signed)
Dr. Kathrynn Humble notified pt to go to [facility]

## 2017-05-10 NOTE — ED Notes (Signed)
Patient was given a snack and drink of water.

## 2017-05-10 NOTE — ED Notes (Signed)
Pt's spouse aware pt accepted to [facility] - RN will call when pt is being transported.

## 2017-05-10 NOTE — ED Notes (Signed)
Pt does not qualify for admission to [facility] d/t hx of TBI and CPAP usage. Pt to be reevaluated by Neuro in July for s/s related to TBI, per wife pt has not used CPAP since procedure.
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
## 2017-05-10 NOTE — ED Notes (Signed)
Per [facility], [facility] - pt accepted to [facility] 304-2 - Dr Parke Poisson. May arrive at 1430.

## 2017-05-10 NOTE — ED Notes (Addendum)
Pt in Pod A9 - spouse and sitter at bedside - spouse's purse and another bag in floor between spouse and pt. Pt ambulatory to F-7 w/spouse, Sitter, and Therapist, sports. Pt noted w/steady gait. Pt wearing burgundy scrubs. Spouse placed her purse and the other bag at nurses' desk d/t unable to take into room w/pt. Spouse taking all pt's belongings home. Spouse and pt aware of tx plan - [facility] later this evening. Agreement w/plan. Both voiced agreement w/Medical Clearance Pt Policy form - copy given to pt. States pt ate few bites of breakfast. Pt noted to be sleepy.

## 2017-05-10 NOTE — ED Notes (Signed)
Spouse visiting w/pt.  

## 2017-05-10 NOTE — ED Notes (Signed)
Pt aware accepted to [facility] - signed consent forms - copy faxed to [facility], copy sent to medical records, and original placed in envelope for [facility]. 

## 2017-05-10 NOTE — ED Notes (Signed)
Charge RN spoke with Lattie Haw, Agricultural consultant at [employer] [facility] and pt can be transferred to [facility]

## 2017-05-10 NOTE — ED Notes (Signed)
Per Lynnda Child at at [facility] pt needs to have PO KCL and urine tox resulted before patient is transferred to [facility]

## 2017-05-10 NOTE — ED Provider Notes (Signed)
Pt is stable for transfer to [facility].  Vitals:   05/10/17 0200 05/10/17 0300  BP: 101/71 98/61  Pulse: (!) 59 (!) 56  Resp: 12 14  Temp:      Pt has no complains, nurses have no complains. PT accepted for admission to [facility].   Varney Biles, MD 05/10/17 [phone]

## 2017-05-10 NOTE — ED Notes (Signed)
Per [facility], [facility] - will call back when can transfer pt.

## 2017-05-10 NOTE — ED Notes (Signed)
A regular diet was ordered for dinner, Pt. Refused a snack. And Didn't eat his Lunch.

## 2017-05-10 NOTE — ED Notes (Signed)
Pt and Spouse aware of tx plan - per Link Snuffer, [facility] - may arrive at 2030 this evening to [facility].

## 2017-05-11 DIAGNOSIS — F17210 Nicotine dependence, cigarettes, uncomplicated: Secondary | ICD-10-CM

## 2017-05-11 DIAGNOSIS — T424X2A Poisoning by benzodiazepines, intentional self-harm, initial encounter: Secondary | ICD-10-CM

## 2017-05-11 DIAGNOSIS — G4700 Insomnia, unspecified: Secondary | ICD-10-CM

## 2017-05-11 DIAGNOSIS — F332 Major depressive disorder, recurrent severe without psychotic features: Secondary | ICD-10-CM

## 2017-05-11 DIAGNOSIS — T1491XA Suicide attempt, initial encounter: Secondary | ICD-10-CM

## 2017-05-11 MED ORDER — BARIATRIC MULTIVITAMINS/IRON PO CAPS
18.0000 mg | ORAL_CAPSULE | Freq: Every day | ORAL | Status: DC
  Administered 2017-05-11 – 2017-05-14 (×4): 18 mg via ORAL

## 2017-05-11 MED ORDER — MIRTAZAPINE 15 MG PO TABS
15.0000 mg | ORAL_TABLET | Freq: Every day | ORAL | Status: DC
  Administered 2017-05-11 – 2017-05-12 (×2): 15 mg via ORAL
  Filled 2017-05-11 (×5): qty 1

## 2017-05-11 MED ORDER — CARBAMAZEPINE 200 MG PO TABS
200.0000 mg | ORAL_TABLET | Freq: Three times a day (TID) | ORAL | Status: DC
  Administered 2017-05-11 – 2017-05-15 (×13): 200 mg via ORAL
  Filled 2017-05-11 (×19): qty 1

## 2017-05-11 MED ORDER — METHYLCOBALAMIN 5000 MCG PO TBDP
5000.0000 ug | ORAL_TABLET | Freq: Every day | ORAL | Status: DC
  Administered 2017-05-12 – 2017-05-15 (×4): 5000 ug via ORAL

## 2017-05-11 MED ORDER — CALCIUM CITRATE-VITAMIN D 500-500 MG-UNIT PO CHEW
1.0000 | CHEWABLE_TABLET | Freq: Two times a day (BID) | ORAL | Status: DC
  Administered 2017-05-11 – 2017-05-15 (×8): 1 via ORAL

## 2017-05-11 MED ORDER — NON FORMULARY: 5000.0000 ug | Freq: Every day | Status: DC

## 2017-05-11 MED ORDER — NON FORMULARY: 18.0000 mg | Freq: Every day | Status: DC

## 2017-05-11 MED ORDER — ADULT MULTIVITAMIN W/MINERALS CH
1.0000 | ORAL_TABLET | Freq: Two times a day (BID) | ORAL | Status: DC
  Administered 2017-05-11 – 2017-05-15 (×8): 1 via ORAL

## 2017-05-11 NOTE — [facility] Suicide Risk Assessment (Signed)
[facility] Admission Suicide Risk Assessment   Nursing information obtained from:    Demographic factors:    Current Mental Status:    Loss Factors:    Historical Factors:    Risk Reduction Factors:     Total Time spent with patient: 45 minutes Principal Problem: MDD Diagnosis:   Patient Active Problem List   Diagnosis Date Noted  . Severe major depression ([place]) [F32.2] 05/10/2017  . Affective psychosis, bipolar ([place]) [F31.9] 05/10/2017  . Head injury [S09.90XA] 05/06/2017  . Dizziness [R42] 05/06/2017  . Vision disturbance [H53.9] 05/06/2017  . History of concussion [Z87.820] 02/10/2017  . Post concussion syndrome [F07.81] 02/10/2017  . Mood disorder ([place]) [F39] 02/10/2017  . 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 Adjustment disorder with anxious mood [F43.22] 01/24/2017  . GAD (generalized anxiety disorder) [F41.1] 01/24/2017  . Gastroesophageal reflux disease without esophagitis [K21.9] 11/19/2016  . Hypothyroidism [E03.9] 11/19/2016  . DDD (degenerative disc disease), lumbar [M51.36] 11/19/2016   Subjective Data:  46 yo Caucasian male, married, lives with his family. Works as a Curator. Background history of mood disorder, PTSD and anxiety disorder. Presented to the ER via EMS. Level of consciousness was altered at presentation. Overdosed on ten tablets of Alprazolam. Reported to have been going through a lot of financial difficulties. Recently evaluated for dizziness and it has been impacting his ability to function at work. Reports long history of depression associated with anxiety. Did well on Citalopram for years. Switched when he stopped responding. Failed trials of multiple agents. Later tried on Abilify. Had tardive dyskinesia. Recently prescribed Cariprazine. Has not filled it yet as it is expensive. Overdose was in context of difficulty with sleep. Says he is no longer able to get into sleep as fast as he used to. He is waking up on multiple occassions. Very tired during  the day and has napped off and on. Increased irritability lately Has been stressed with a lot of financial issues " my car is being repossessed today ,,,, I am behind on the trucks ,,,, phone bills are due ,,,, I got calls from debt collectors ,,,,, I just wanted to get some rest". States that the OD was not a suicidal attempt. Says he sent a text to his wife to say that he took some medicine so he can get at least ten hours sleep. No final acts. Pleased he is alive. Says his religion does not permit suicide. No evidence of psychosis. No evidence of mania. No substance use. Loves to fish and hunt. Has weapons at home. No past inpatient treatment. Single episode of suicidal attempt at the age of 46 years. Impulsive following a break-up. No past manic episodes.   We explored treatment options. We agreed to target his symptoms with a combination of Tegretol and Mirtazapine.  We reviewed the risks and benefits respectively. Patient consented respectively to treatment.    Continued Clinical Symptoms:  Alcohol Use Disorder Identification Test Final Score (AUDIT): 0 The "Alcohol Use Disorders Identification Test", Guidelines for Use in Primary Care, Second Edition.  World Pharmacologist [facility]). Score between 0-7:  no or low risk or alcohol related problems. Score between 8-15:  moderate risk of alcohol related problems. Score between 16-19:  high risk of alcohol related problems. Score 20 or above:  warrants further diagnostic evaluation for alcohol dependence and treatment.   CLINICAL FACTORS:   Panic Attacks Depression:   Severe   Musculoskeletal: Strength & Muscle Tone: within normal limits Gait & Station: normal Patient leans: N/A  Psychiatric Specialty Exam: Physical Exam  Constitutional: He is oriented to person, place, and time. He appears well-developed and well-nourished.  HENT:  Head: Normocephalic.  Eyes: Conjunctivae are normal. Pupils are equal, round, and reactive to light.   Neck: Normal range of motion. Neck supple.  Cardiovascular: Normal rate.   Respiratory: Effort normal and breath sounds normal.  GI: Soft. Bowel sounds are normal.  Musculoskeletal: Normal range of motion.  Neurological: He is alert and oriented to person, place, and time.  Psychiatric:  As above    ROS  Blood pressure 121/79, pulse 79, temperature 98.4 F (36.9 C), temperature source Oral, resp. rate 16, height 5\' 9"  (1.753 m), weight 84.4 kg (186 lb), SpO2 100 %.Body mass index is 27.47 kg/m.  General Appearance:  In hospital clothing, not in any distress. No tremors. Not sweaty. Not internally distracted. Appropriate behavior.   Eye Contact:  Good  Speech:  Clear and Coherent  Volume:  Normal  Mood:  Worried and depressed.   Affect:  Restricted  Thought Process:  Linear  Orientation:  Full (Time, Place, and Person)  Thought Content:  Rumination  Suicidal Thoughts:  No  Homicidal Thoughts:  No  Memory:  Immediate;   Good Recent;   Good Remote;   Good  Judgement:  Fair  Insight:  Good  Psychomotor Activity:  Normal  Concentration:  Concentration: Fair and Attention Span: Fair  Recall:  Good  Fund of Knowledge:  Good  Language:  Good  Akathisia:  No  Handed:    AIMS (if indicated):     Assets:  Communication Skills Desire for Improvement Housing Intimacy Physical Health Vocational/Educational  ADL's:  Intact  Cognition:  WNL  Sleep:  Number of Hours: 4      COGNITIVE FEATURES THAT CONTRIBUTE TO RISK:  None    SUICIDE RISK:   Mild:  Suicidal ideation of limited frequency, intensity, duration, and specificity.  There are no identifiable plans, no associated intent, mild dysphoria and related symptoms, good self-control (both objective and subjective assessment), few other risk factors, and identifiable protective factors, including available and accessible social support.  PLAN OF CARE:   Psychiatric: Unipolar Depression Panic  Disorder  Medical: Bariatric surgery Past history of concussion  Psychosocial:  Financial difficulties  PLAN: 1.  Tegretol 200 mg TID 2.  Mirtazapine 15 mg HS 3.  Monitor for withdrawals from benzodiazepines 4. Collateral from his wife 5. Encourage unit groups and activities 6. Monitor mood, behavior and interaction with peers 7. Suicide precautions.    I certify that inpatient services furnished can reasonably be expected to improve the patient's condition.   [place], MD 05/11/2017, 9:51 AM

## 2017-05-11 NOTE — [facility] Group Notes (Signed)
The focus of this group is to educate the patient on the purpose and policies of crisis stabilization and provide a format to answer questions about their admission.  The group details unit policies and expectations of patients while admitted.  Patient attended 0900 nurse education orientation group this morning.  Patient actively participated and had appropriate affect.  Patient is alert.  Patient had appropriate insight and appropriate engagement.  Today patient will work on 3 goals for discharge.  

## 2017-05-11 NOTE — H&P (Signed)
Psychiatric Admission Assessment Adult  Patient Identification: Marvin Espinoza MRN:  [id number] Date of Evaluation:  05/11/2017 Chief Complaint:  Bipolar disorder by hx MRE depressed GAD by hx Principal Diagnosis: Severe major depression ([place]) Diagnosis:   Patient Active Problem List   Diagnosis Date Noted  . Severe major depression ([place]) [F32.2] 05/10/2017    Priority: High  . Affective psychosis, bipolar ([place]) [F31.9] 05/10/2017  . Head injury [S09.90XA] 05/06/2017  . Dizziness [R42] 05/06/2017  . Vision disturbance [H53.9] 05/06/2017  . History of concussion [Z87.820] 02/10/2017  . Post concussion syndrome [F07.81] 02/10/2017  . Mood disorder ([place]) [F39] 02/10/2017  . 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 Adjustment disorder with anxious mood [F43.22] 01/24/2017  . GAD (generalized anxiety disorder) [F41.1] 01/24/2017  . Gastroesophageal reflux disease without esophagitis [K21.9] 11/19/2016  . Hypothyroidism [E03.9] 11/19/2016  . DDD (degenerative disc disease), lumbar [M51.36] 11/19/2016   History of Present Illness: Marvin Espinoza, 46 yo male, married, initially presented to [facility] after he overdosed on Xanax.  Patient has history of depression and anxiety disorder.  Patient states that he had many stressors to include financial hardship, poor to no sleep, unstable job due to new onset of dizziness.  He states that he is not sure how he'll be able to support family, he is behind payments for house and car.    He states that he did not mean to overdose, he just "really just wanted to get some sleep."  He states that he has been on psych different medications, denies non compliance.  Denies SI HI paranoia or psychosis./  He is interacting appropriately win milieu and with staff members.     Associated Signs/Symptoms: Depression Symptoms:  depressed mood, fatigue, (Hypo) Manic Symptoms:  Impulsivity, Labiality of Mood, Anxiety Symptoms:  Excessive Worry, Psychotic Symptoms:   NA PTSD Symptoms: NA Total Time spent with patient: 30 minutes  Past Psychiatric History: see HPI  Is the patient at risk to self? Yes.    Has the patient been a risk to self in the past 6 months? Yes.    Has the patient been a risk to self within the distant past? Yes.    Is the patient a risk to others? No.  Has the patient been a risk to others in the past 6 months? No.  Has the patient been a risk to others within the distant past? No.   Prior Inpatient Therapy:   Prior Outpatient Therapy:    Alcohol Screening: 1. How often do you have a drink containing alcohol?: Never 2. How many drinks containing alcohol do you have on a typical day when you are drinking?: 1 or 2 3. How often do you have six or more drinks on one occasion?: Never Preliminary Score: 0 4. How often during the last year have you found that you were not able to stop drinking once you had started?: Never 5. How often during the last year have you failed to do what was normally expected from you becasue of drinking?: Never 6. How often during the last year have you needed a first drink in the morning to get yourself going after a heavy drinking session?: Never 7. How often during the last year have you had a feeling of guilt of remorse after drinking?: Never 8. How often during the last year have you been unable to remember what happened the night before because you had been drinking?: Never 9. Have you or someone else been injured as a result of your  drinking?: No 10. Has a relative or friend or a doctor or another health worker been concerned about your drinking or suggested you cut down?: No Alcohol Use Disorder Identification Test Final Score (AUDIT): 0 Brief Intervention: AUDIT score less than 7 or less-screening does not suggest unhealthy drinking-brief intervention not indicated Substance Abuse History in the last 12 months:  Yes.   Consequences of Substance Abuse: NA Previous Psychotropic Medications: Yes   Psychological Evaluations: Yes  Past Medical History:  Past Medical History:  Diagnosis Date  . Anxiety   . Bipolar disorder ([place])   . CTS (carpal tunnel syndrome)   . Depression   . GERD (gastroesophageal reflux disease)   . H/O bariatric surgery   . Hypothyroidism   . Hypothyroidism   . Sleep apnea    uses CPAP nightly  . Sleep apnea    wears CPAP nightly    Past Surgical History:  Procedure Laterality Date  . ANAL FISSURE REPAIR    . BARIATRIC SURGERY    . CARPAL TUNNEL RELEASE Left 05/09/2015   Procedure: LEFT CARPAL TUNNEL RELEASE;  Surgeon: Daryll Brod, MD;  Location: [place];  Service: Orthopedics;  Laterality: Left;  . CARPAL TUNNEL RELEASE Left 05-09-2015  . CARPAL TUNNEL RELEASE Right 06/20/2015   Procedure: RIGHT CARPAL TUNNEL RELEASE;  Surgeon: Daryll Brod, MD;  Location: [place];  Service: Orthopedics;  Laterality: Right;  REGIONAL/FAB  . CHOLECYSTECTOMY     Family History:  Family History  Problem Relation Age of Onset  . COPD Mother   . Diabetes Mother   . Heart disease Mother   . Depression Mother   . COPD Father   . Heart disease Father   . Kidney disease Father   . Mental illness Father   . Depression Father   . Mental illness Sister   . Depression Sister    Family Psychiatric  History: see HPI Tobacco Screening: Have you used any form of tobacco in the last 30 days? (Cigarettes, Smokeless Tobacco, Cigars, and/or Pipes): No Tobacco use, Select all that apply: 4 or less cigarettes per day Are you interested in Tobacco Cessation Medications?: No, patient refused Counseled patient on smoking cessation including recognizing danger situations, developing coping skills and basic information about quitting provided: Refused/Declined practical counseling Social History:  History  Alcohol Use No     History  Drug Use No    Additional Social History: Marital status: Married Number of Years Married: 5 What types of  issues is patient dealing with in the relationship?: Money Additional relationship information: This is his second marriage Are you sexually active?: Yes Does patient have children?: Yes How many children?: 1 How is patient's relationship with their children?: 15yo son - "Pretty good but I can't deal with him all the time.  It's not him, it's me."    Pain Medications: see mar Prescriptions: SEE MAR History of alcohol / drug use?: No history of alcohol / drug abuse                    Allergies:   Allergies  Allergen Reactions  . Abilify [Aripiprazole] Anaphylaxis  . Alcohol Itching    Drinking alcohol  . Hydrocodone-Acetaminophen Itching  . Victoza [Liraglutide] Other (See Comments)    Severe heartburn   . Victoza [Liraglutide]     Severe heartburn   Lab Results:  Results for orders placed or performed during the hospital encounter of 05/09/17 (from the past 48 hour(s))  Rapid urine drug screen (hospital performed)     Status: Abnormal   Collection Time: 05/09/17  8:21 PM  Result Value Ref Range   Opiates NONE DETECTED NONE DETECTED   Cocaine NONE DETECTED NONE DETECTED   Benzodiazepines POSITIVE (A) NONE DETECTED   Amphetamines NONE DETECTED NONE DETECTED   Tetrahydrocannabinol NONE DETECTED NONE DETECTED   Barbiturates NONE DETECTED NONE DETECTED    Comment:        DRUG SCREEN FOR MEDICAL PURPOSES ONLY.  IF CONFIRMATION IS NEEDED FOR ANY PURPOSE, NOTIFY LAB WITHIN 5 DAYS.        LOWEST DETECTABLE LIMITS FOR URINE DRUG SCREEN Drug Class       Cutoff (ng/mL) Amphetamine      1000 Barbiturate      200 Benzodiazepine   676 Tricyclics       195 Opiates          300 Cocaine          300 THC              50   Acetaminophen level     Status: Abnormal   Collection Time: 05/09/17  8:56 PM  Result Value Ref Range   Acetaminophen (Tylenol), Serum <10 (L) 10 - 30 ug/mL    Comment:        THERAPEUTIC CONCENTRATIONS VARY SIGNIFICANTLY. A RANGE OF 10-30 ug/mL MAY  BE AN EFFECTIVE CONCENTRATION FOR MANY PATIENTS. HOWEVER, SOME ARE BEST TREATED AT CONCENTRATIONS OUTSIDE THIS RANGE. ACETAMINOPHEN CONCENTRATIONS >150 ug/mL AT 4 HOURS AFTER INGESTION AND >50 ug/mL AT 12 HOURS AFTER INGESTION ARE OFTEN ASSOCIATED WITH TOXIC REACTIONS.   Salicylate level     Status: None   Collection Time: 05/09/17  8:56 PM  Result Value Ref Range   Salicylate Lvl <0.9 2.8 - 30.0 mg/dL  Ethanol     Status: None   Collection Time: 05/09/17  8:56 PM  Result Value Ref Range   Alcohol, Ethyl (B) <5 <5 mg/dL    Comment:        LOWEST DETECTABLE LIMIT FOR SERUM ALCOHOL IS 5 mg/dL FOR MEDICAL PURPOSES ONLY   Comprehensive metabolic panel     Status: Abnormal   Collection Time: 05/09/17  8:56 PM  Result Value Ref Range   Sodium 141 135 - 145 mmol/L   Potassium 3.3 (L) 3.5 - 5.1 mmol/L   Chloride 113 (H) 101 - 111 mmol/L   CO2 25 22 - 32 mmol/L   Glucose, Bld 127 (H) 65 - 99 mg/dL   BUN 12 6 - 20 mg/dL   Creatinine, Ser 0.80 0.61 - 1.24 mg/dL   Calcium 8.1 (L) 8.9 - 10.3 mg/dL   Total Protein 5.4 (L) 6.5 - 8.1 g/dL   Albumin 3.3 (L) 3.5 - 5.0 g/dL   AST 27 15 - 41 U/L   ALT 31 17 - 63 U/L   Alkaline Phosphatase 48 38 - 126 U/L   Total Bilirubin 0.2 (L) 0.3 - 1.2 mg/dL   GFR calc non Af Amer >60 >60 mL/min   GFR calc Af Amer >60 >60 mL/min    Comment: (NOTE) The eGFR has been calculated using the CKD EPI equation. This calculation has not been validated in all clinical situations. eGFR's persistently <60 mL/min signify possible Chronic Kidney Disease.    Anion gap 3 (L) 5 - 15  CBC     Status: Abnormal   Collection Time: 05/09/17  8:56 PM  Result Value Ref Range   WBC  5.7 4.0 - 10.5 K/uL   RBC 3.91 (L) 4.22 - 5.81 MIL/uL   Hemoglobin 11.2 (L) 13.0 - 17.0 g/dL   HCT 33.4 (L) 39.0 - 52.0 %   MCV 85.4 78.0 - 100.0 fL   MCH 28.6 26.0 - 34.0 pg   MCHC 33.5 30.0 - 36.0 g/dL   RDW 13.4 11.5 - 15.5 %   Platelets 189 150 - 400 K/uL    Blood Alcohol  level:  Lab Results  Component Value Date   ETH <5 50/08/3817    Metabolic Disorder Labs:  No results found for: HGBA1C, MPG No results found for: PROLACTIN Lab Results  Component Value Date   CHOL 111 04/25/2017   TRIG 39 04/25/2017   HDL 47 04/25/2017   CHOLHDL 2.4 04/25/2017   LDLCALC 56 04/25/2017    Current Medications: Current Facility-Administered Medications  Medication Dose Route Frequency Provider Last Rate Last Dose  . acetaminophen (TYLENOL) tablet 650 mg  650 mg Oral Q6H PRN Ethelene Hal, NP      . carbamazepine (TEGRETOL) tablet 200 mg  200 mg Oral TID Artist Beach, MD   200 mg at 05/11/17 1200  . cyclobenzaprine (FLEXERIL) tablet 10 mg  10 mg Oral TID PRN Ethelene Hal, NP   10 mg at 05/10/17 2303  . hydrOXYzine (ATARAX/VISTARIL) tablet 25 mg  25 mg Oral Q6H PRN Ethelene Hal, NP   25 mg at 05/10/17 2303  . lamoTRIgine (LAMICTAL) tablet 50 mg  50 mg Oral BID Ethelene Hal, NP   50 mg at 05/11/17 0816  . levothyroxine (SYNTHROID, LEVOTHROID) tablet 100 mcg  100 mcg Oral QAC breakfast Ethelene Hal, NP   100 mcg at 05/11/17 2993  . mirtazapine (REMERON) tablet 15 mg  15 mg Oral QHS Izediuno, Vincent A, MD      . pantoprazole (PROTONIX) EC tablet 40 mg  40 mg Oral Daily Ethelene Hal, NP   40 mg at 05/11/17 0816  . traZODone (DESYREL) tablet 50 mg  50 mg Oral QHS PRN Ethelene Hal, NP   50 mg at 05/10/17 2303   PTA Medications: Prescriptions Prior to Admission  Medication Sig Dispense Refill Last Dose  . ALPRAZolam (XANAX) 1 MG tablet Take 1 tablet (1 mg total) by mouth 3 (three) times daily as needed for anxiety. 30 tablet 5 05/09/2017 at Unknown time  . calcium-vitamin D (OSCAL WITH D) 500-200 MG-UNIT tablet Take 1 tablet by mouth 2 (two) times daily.   05/09/2017 at Unknown time  . Colchicine 0.6 MG CAPS Take 0.6 mg by mouth 2 (two) times daily. (Patient taking differently: Take 0.6 mg by mouth 2 (two)  times daily as needed (gout). ) 40 capsule 2 unk  . Cyanocobalamin (VITAMIN B-12 PO) Take 1 tablet by mouth daily.   05/09/2017 at Unknown time  . cyclobenzaprine (FLEXERIL) 10 MG tablet Take 10 mg by mouth 3 (three) times daily as needed for muscle spasms.   months  . lamoTRIgine (LAMICTAL) 25 MG tablet TAKE 1 TO 2 TABLETS TWICE DAILY (Patient taking differently: TAKE 2 TABLETS TWICE DAILY) 120 tablet 5 05/09/2017 at am  . levothyroxine (SYNTHROID, LEVOTHROID) 100 MCG tablet TAKE ONE (1) TABLET EACH DAY 30 tablet 9 05/09/2017 at Unknown time  . meclizine (ANTIVERT) 25 MG tablet Take 1 tablet (25 mg total) by mouth 3 (three) times daily as needed for dizziness. 90 tablet 2 05/09/2017 at Unknown time  . Multiple Vitamin (MULTIVITAMIN WITH MINERALS)  TABS tablet Take 1 tablet by mouth 2 (two) times daily.   05/09/2017 at Unknown time  . omeprazole (PRILOSEC) 40 MG capsule TAKE ONE (1) CAPSULE EACH DAY 30 capsule 2 05/09/2017 at Unknown time  . promethazine (PHENERGAN) 25 MG tablet Take 1 tablet (25 mg total) by mouth every 6 (six) hours as needed for nausea or vomiting. 60 tablet 2 unk  . traMADol (ULTRAM) 50 MG tablet Take 1 tablet (50 mg total) by mouth every 6 (six) hours as needed. (Patient taking differently: Take 50 mg by mouth every 6 (six) hours as needed for moderate pain. ) 120 tablet 5 unk  . VRAYLAR 1.5 MG CAPS Take 3 mg by mouth at bedtime.   0 05/08/2017 at Unknown time    Musculoskeletal: Strength & Muscle Tone: within normal limits Gait & Station: normal Patient leans: N/A  Psychiatric Specialty Exam: Physical Exam  Nursing note and vitals reviewed.   ROS  Blood pressure 121/79, pulse 79, temperature 98.4 F (36.9 C), temperature source Oral, resp. rate 16, height '5\' 9"'  (1.753 m), weight 84.4 kg (186 lb), SpO2 100 %.Body mass index is 27.47 kg/m.  General Appearance: Disheveled  Eye Contact:  Good  Speech:  Clear and Coherent  Volume:  Normal  Mood:  Depressed  Affect:   Restricted  Thought Process:  Linear  Orientation:  Full (Time, Place, and Person)  Thought Content:  Rumination  Suicidal Thoughts:  No  Homicidal Thoughts:  No  Memory:  Immediate;   Fair Recent;   Fair Remote;   Fair  Judgement:  Fair  Insight:  Fair  Psychomotor Activity:  Normal  Concentration:  Concentration: Fair and Attention Span: Good  Recall:  [place] of Knowledge:  Good  Language:  Good  Akathisia:  No  Handed:  Right  AIMS (if indicated):     Assets:  Communication Skills  ADL's:  Intact  Cognition:  WNL  Sleep:  Number of Hours: 4   Treatment Plan Summary: Admit for crisis management and mood stabilization. Medication management to re-stabilize current mood symptoms Group counseling sessions for coping skills Medical consults as needed Review and reinstate any pertinent home medications for other health problems  Observation Level/Precautions:  15 minute checks  Laboratory:  per ED  Psychotherapy:  group  Medications:   1.  Tegretol 200 mg TID 2.  Mirtazapine 15 mg HS  Consultations:  As needed  Discharge Concerns:  safety  Estimated LOS:  2-7 days  Other:     Physician Treatment Plan for Primary Diagnosis: Severe major depression ([place]) Long Term Goal(s): Improvement in symptoms so as ready for discharge  Short Term Goals: Ability to identify changes in lifestyle to reduce recurrence of condition will improve, Ability to verbalize feelings will improve, Ability to disclose and discuss suicidal ideas, Ability to demonstrate self-control will improve, Ability to identify and develop effective coping behaviors will improve, Ability to maintain clinical measurements within normal limits will improve, Compliance with prescribed medications will improve and Ability to identify triggers associated with substance abuse/mental health issues will improve  Physician Treatment Plan for Secondary Diagnosis: Principal Problem:   Severe major depression ([place]) Active  Problems:   Affective psychosis, bipolar ([place])  Long Term Goal(s): Improvement in symptoms so as ready for discharge  Short Term Goals: Ability to identify changes in lifestyle to reduce recurrence of condition will improve, Ability to verbalize feelings will improve, Ability to disclose and discuss suicidal ideas, Ability to demonstrate self-control will  improve, Ability to identify and develop effective coping behaviors will improve, Ability to maintain clinical measurements within normal limits will improve, Compliance with prescribed medications will improve and Ability to identify triggers associated with substance abuse/mental health issues will improve  I certify that inpatient services furnished can reasonably be expected to improve the patient's condition.    [facility], NP [facility] 5/27/20183:17 PM
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
## 2017-05-11 NOTE — BHH Group Notes (Signed)
[facility] LCSW Group Therapy  05/11/2017  10 AM  Type of Therapy:  Group Therapy  Participation Level:  Minimal  Participation Quality:  Attentive  Affect:  Flat  Cognitive:  Alert  Insight:  Limited  Engagement in Therapy:  Improving  Modes of Intervention:  Clarification, Exploration, [place], Socialization and Support  Summary of Progress/Problems: Topic for today was thoughts and feelings regarding discharge. We discussed fears of upcoming changes including judgements, expectations and stigma of mental health issues. We then discussed supports: what constitutes a supportive framework, identification of supports and what to do when others are not supportive. Patients then identified a specific coping tool to use when others are not available. Patient came in late to group yet was able to relate and shared that he needs some new supports in his life.    Sheilah Pigeon, LCSW

## 2017-05-11 NOTE — Progress Notes (Signed)
D: Spoke with patient 1:1. Patient's affect flat, sad and depressed with congruent mood. Rating depression at an 8 /10, hopelessness at a 6/10 and anxiety at a 7/10. Rates sleep as fair, appetite as poor and energy as low.  States goal for today is to "get home" and "comply." Denies pain, physical problems.   A: Medicated per orders, no prns requested or required. Emotional support offered and self inventory reviewed. Encouraged completion of Suicide Safety Plan. Discussed POC with MD, SW.   R: Patient verbalizes understanding of POC. Patient denies SI/HI/AVH and remains safe on level III obs.

## 2017-05-11 NOTE — Progress Notes (Signed)
Patient has been isolative to room most of day. Refusing lunch and dinner and states, "I usually just drink protein shakes. I don't eat much." Fluids provided and patient hydrated. Wife had presented to the lobby earlier today but did not have code. At that time encouraged patient to call wife, as this Probation officer had also done earlier in shift when she called. Patient did get up to phone and states he called her during dinner. States she will bring shakes in for his use. Patient remains anxious, depressed and flat.

## 2017-05-11 NOTE — Progress Notes (Signed)
[place] [facility] Group Notes:  (Nursing/MHT/Case Management/Adjunct)  Date:  05/11/2017  Time:  2045 Type of Therapy:  wrap up group  Participation Level:  Minimal  Participation Quality:  Appropriate, Attentive, Sharing and Supportive  Affect:  Depressed and Flat  Cognitive:  Appropriate  Insight:  Improving  Engagement in Group:  attentive  Modes of Intervention:  Clarification, Education and Support  Summary of Progress/Problems: When pt asked what was good about his day pt reported he was started on medicine he feels will work. Pt shared that if he could change any one thing about his life he never would have started taking so much medication that he refers to as a crutch.  Pt shared that he plans to discharge home to his wife and 46 year old son whom he is grateful for. Shellia Cleverly 05/11/2017, 9:49 PM

## 2017-05-12 NOTE — Progress Notes (Signed)
[facility] MD Progress Note  05/12/2017 3:59 PM Marvin Espinoza  MRN:  [id number] Subjective:   46 yo Caucasian male, married, lives with his family. Works as a Curator. Background history of mood disorder, PTSD and anxiety disorder. Presented to the ER via EMS. Level of consciousness was altered at presentation. Overdosed on ten tablets of Alprazolam. Reported to have been going through a lot of financial difficulties. Recently evaluated for dizziness and it has been impacting his ability to function at work. Chart reviewed today. Patient discussed at team  Staff reports that he been pleasant. No behavioral issues. He participated at some of the unit groups and activities. No observable side effects.   Seen today. Tolerating his medications well. Says he slept well last night. Feels less depressed. No suicidal thoughts. Appetite and energy is good. Says his wife brought in some underwear. She would be bringing in some clothes later.  Principal Problem: Severe major depression ([place]) Diagnosis:   Patient Active Problem List   Diagnosis Date Noted  . Severe major depression ([place]) [F32.2] 05/10/2017  . Affective psychosis, bipolar ([place]) [F31.9] 05/10/2017  . Head injury [S09.90XA] 05/06/2017  . Dizziness [R42] 05/06/2017  . Vision disturbance [H53.9] 05/06/2017  . History of concussion [Z87.820] 02/10/2017  . Post concussion syndrome [F07.81] 02/10/2017  . Mood disorder ([place]) [F39] 02/10/2017  . 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 Adjustment disorder with anxious mood [F43.22] 01/24/2017  . GAD (generalized anxiety disorder) [F41.1] 01/24/2017  . Gastroesophageal reflux disease without esophagitis [K21.9] 11/19/2016  . Hypothyroidism [E03.9] 11/19/2016  . DDD (degenerative disc disease), lumbar [M51.36] 11/19/2016   Total Time spent with patient: 20 minutes  Past Psychiatric History: As in H&P  Past Medical History:  Past Medical History:  Diagnosis Date  . Anxiety   . Bipolar  disorder ([place])   . CTS (carpal tunnel syndrome)   . Depression   . GERD (gastroesophageal reflux disease)   . H/O bariatric surgery   . Hypothyroidism   . Hypothyroidism   . Sleep apnea    uses CPAP nightly  . Sleep apnea    wears CPAP nightly    Past Surgical History:  Procedure Laterality Date  . ANAL FISSURE REPAIR    . BARIATRIC SURGERY    . CARPAL TUNNEL RELEASE Left 05/09/2015   Procedure: LEFT CARPAL TUNNEL RELEASE;  Surgeon: Daryll Brod, MD;  Location: [facility];  Service: Orthopedics;  Laterality: Left;  . CARPAL TUNNEL RELEASE Left 05-09-2015  . CARPAL TUNNEL RELEASE Right 06/20/2015   Procedure: RIGHT CARPAL TUNNEL RELEASE;  Surgeon: Daryll Brod, MD;  Location: [facility];  Service: Orthopedics;  Laterality: Right;  REGIONAL/FAB  . CHOLECYSTECTOMY     Family History:  Family History  Problem Relation Age of Onset  . COPD Mother   . Diabetes Mother   . Heart disease Mother   . Depression Mother   . COPD Father   . Heart disease Father   . Kidney disease Father   . Mental illness Father   . Depression Father   . Mental illness Sister   . Depression Sister    Family Psychiatric  History: As in H&P  Social History:  History  Alcohol Use No     History  Drug Use No    Social History   Social History  . Marital status: Married    Spouse name: N/A  . Number of children: N/A  . Years of education: N/A   Social History Main  Topics  . Smoking status: Never Smoker  . Smokeless tobacco: Never Used  . Alcohol use No  . Drug use: No  . Sexual activity: Yes   Other Topics Concern  . None   Social History Narrative   ** Merged History Encounter **       Additional Social History:    Pain Medications: see mar Prescriptions: SEE MAR History of alcohol / drug use?: No history of alcohol / drug abuse                    Sleep: Good  Appetite:  Good  Current Medications: Current Facility-Administered Medications   Medication Dose Route Frequency Provider Last Rate Last Dose  . acetaminophen (TYLENOL) tablet 650 mg  650 mg Oral Q6H PRN Ethelene Hal, NP      . BARIATRIC MULTIVITAMINS/IRON CAPS 18 mg  18 mg Oral QHS Cobos, Myer Peer, MD   18 mg at 05/11/17 2200  . calcium citrate-vitamin D 500-500 MG-UNIT per chewable tablet 1 tablet  1 tablet Oral BID AC & HS Derrill Center, NP   1 tablet at 05/12/17 0620  . carbamazepine (TEGRETOL) tablet 200 mg  200 mg Oral TID Artist Beach, MD   200 mg at 05/12/17 1205  . cyclobenzaprine (FLEXERIL) tablet 10 mg  10 mg Oral TID PRN Ethelene Hal, NP   10 mg at 05/11/17 2212  . hydrOXYzine (ATARAX/VISTARIL) tablet 25 mg  25 mg Oral Q6H PRN Ethelene Hal, NP   25 mg at 05/11/17 2212  . lamoTRIgine (LAMICTAL) tablet 50 mg  50 mg Oral BID Ethelene Hal, NP   50 mg at 05/12/17 0802  . levothyroxine (SYNTHROID, LEVOTHROID) tablet 100 mcg  100 mcg Oral QAC breakfast Ethelene Hal, NP   100 mcg at 05/12/17 0615  . Methylcobalamin TBDP 5,000 mcg  5,000 mcg Oral Daily Cobos, Myer Peer, MD   5,000 mcg at 05/12/17 0620  . mirtazapine (REMERON) tablet 15 mg  15 mg Oral QHS Izediuno, Vincent A, MD   15 mg at 05/11/17 2212  . multivitamin with minerals tablet 1 tablet  1 tablet Oral BID AC & HS Derrill Center, NP   1 tablet at 05/12/17 3016  . pantoprazole (PROTONIX) EC tablet 40 mg  40 mg Oral Daily Ethelene Hal, NP   40 mg at 05/12/17 0802  . traZODone (DESYREL) tablet 50 mg  50 mg Oral QHS PRN Ethelene Hal, NP   50 mg at 05/10/17 2303    Lab Results: No results found for this or any previous visit (from the past 48 hour(s)).  Blood Alcohol level:  Lab Results  Component Value Date   ETH <5 12/24/3233    Metabolic Disorder Labs: No results found for: HGBA1C, MPG No results found for: PROLACTIN Lab Results  Component Value Date   CHOL 111 04/25/2017   TRIG 39 04/25/2017   HDL 47 04/25/2017   CHOLHDL 2.4  04/25/2017   LDLCALC 56 04/25/2017    Physical Findings: AIMS: Facial and Oral Movements Muscles of Facial Expression: None, normal Lips and Perioral Area: None, normal Jaw: None, normal Tongue: None, normal,Extremity Movements Upper (arms, wrists, hands, fingers): None, normal Lower (legs, knees, ankles, toes): None, normal, Trunk Movements Neck, shoulders, hips: None, normal, Overall Severity Severity of abnormal movements (highest score from questions above): None, normal Incapacitation due to abnormal movements: None, normal Patient's awareness of abnormal movements (rate only patient's report): No  Awareness, Dental Status Current problems with teeth and/or dentures?: No Does patient usually wear dentures?: No  CIWA:  CIWA-Ar Total: 0 COWS:     Musculoskeletal: Strength & Muscle Tone: within normal limits Gait & Station: normal Patient leans: N/A  Psychiatric Specialty Exam: Physical Exam  Constitutional: He is oriented to person, place, and time. He appears well-developed and well-nourished.  HENT:  Head: Normocephalic and atraumatic.  Eyes: Pupils are equal, round, and reactive to light.  Neck: Normal range of motion. Neck supple.  Cardiovascular: Normal rate and regular rhythm.   Respiratory: Effort normal and breath sounds normal.  GI: Soft. Bowel sounds are normal.  Musculoskeletal: Normal range of motion.  Neurological: He is alert and oriented to person, place, and time.  Skin: Skin is warm and dry.  Psychiatric:  As above    ROS  Blood pressure 122/81, pulse 78, temperature 98.1 F (36.7 C), temperature source Oral, resp. rate 16, height 5\' 9"  (1.753 m), weight 84.4 kg (186 lb), SpO2 100 %.Body mass index is 27.47 kg/m.  General Appearance: In hospital clothing, pleasant, calm and cooperative. Appropriate behavior.   Eye Contact:  Good  Speech:  Clear and Coherent and Normal Rate  Volume:  Normal  Mood:  Euthymic  Affect:  Appropriate and Full Range   Thought Process:  Linear  Orientation:  Full (Time, Place, and Person)  Thought Content:  Less ruminations about his financial issues. No delusional theme. No preoccupation with violent thoughts. No obsession.  No hallucination in any modality.   Suicidal Thoughts:  No  Homicidal Thoughts:  No  Memory:  Immediate;   Good Recent;   Good Remote;   Good  Judgement:  Good  Insight:  Good  Psychomotor Activity:  Normal  Concentration:  Concentration: Good and Attention Span: Good  Recall:  Good  Fund of Knowledge:  Good  Language:  Good  Akathisia:  Negative  Handed:    AIMS (if indicated):     Assets:  Communication Skills Desire for Improvement Housing Intimacy Physical Health Resilience Social Support Talents/Skills Vocational/Educational  ADL's:  Fair  Cognition:  WNL  Sleep:  Number of Hours: 5.75     Treatment Plan Summary:  Patient is tolerating recent medication adjustment well. He slept well last night. He seem to be in better spirits today. He needs further evaluation and collateral from his family.   Psychiatric: Unipolar Depression Panic Disorder  Medical: Bariatric surgery Past history of concussion  Psychosocial:  Financial difficulties  PLAN: 1. Continue current regimen 2. Continue to monitor mood, behavior and interaction with peers. 3. Collateral from his wife  Artist Beach, MD 05/12/2017, 3:59 PM

## 2017-05-12 NOTE — Progress Notes (Signed)
Recreation Therapy Notes  Date: 05/12/17 Time: 0930 Location: 300 Hall Dayroom  Group Topic: Stress Management  Goal Area(s) Addresses:  Patient will verbalize importance of using healthy stress management.  Patient will identify positive emotions associated with healthy stress management.   Behavioral Response: Engaged  Intervention: Stress Management  Activity :  Guided Imagery.  LRT introduced the stress management concept of guided imagery.  LRT read a script to allow patients to engage in guided imagery.  Patients were to follow along to fully participate in the activity.  Education:  Stress Management, Discharge Planning.   Education Outcome: Acknowledges edcuation/In group clarification offered/Needs additional education  Clinical Observations/Feedback: Pt attended group.   Victorino Sparrow, LRT/CTRS         Victorino Sparrow A 05/12/2017 11:41 AM

## 2017-05-12 NOTE — Progress Notes (Signed)
Patient has remained isolative to room again today. Drinking protein shakes in place of meals. "This is what I do at home. Otherwise, I throw up. I just don't have much appetite." Patient is more verbal, animated since resuming protein shakes. Suspect patient was weak and at a calorie deficit yesterday. Will continue to encourage milieu participation but at this time, still flat, depressed and withdrawn. Finally allowed staff to provide wife with code number. No SI, remains safe. Marvin Espinoza

## 2017-05-12 NOTE — Plan of Care (Signed)
Problem: Safety: Goal: Periods of time without injury will increase Outcome: Progressing Patient has not engaged in self harm, denies SI.  Problem: Medication: Goal: Compliance with prescribed medication regimen will improve Outcome: Progressing Patient is med compliant.

## 2017-05-12 NOTE — Progress Notes (Signed)
Pt attended [place] group. Marvin Espinoza 05/12/2017  23:57

## 2017-05-12 NOTE — [facility] Group Notes (Signed)
[facility] LCSW Aftercare Discharge Planning Group Note   05/12/2017  8:45 AM  Participation Quality:  Pt invited. Did not attend.    Georga Kaufmann, MSW, LCSWA 05/12/2017 1:17 PM

## 2017-05-12 NOTE — Progress Notes (Signed)
D: Spoke with patient 1:1 who is resting in bed. Remains isolative to bed, room. Minimal interaction though pleasant on approach. Patient's affect flat, anxious and depressed with congruent mood. Rating depression at a 2/10, hopelessness at a 0/10 and anxiety at a 1/10. Rates sleep as good, appetite as good, energy as normal and concentration as good.  States goal for today is to "eat properly, other than that feeling great." Denies pain, physical problems.   A: Medicated per orders, no prns requested or required. Emotional support offered and self inventory reviewed. Encouraged completion of Suicide Safety Plan. Discussed POC with MD, SW.   R: Patient verbalizes understanding of POC. Patient denies SI/HI and remains safe on level III obs. Will continue to encourage participation in programming.

## 2017-05-13 DIAGNOSIS — R42 Dizziness and giddiness: Secondary | ICD-10-CM

## 2017-05-13 DIAGNOSIS — F322 Major depressive disorder, single episode, severe without psychotic features: Principal | ICD-10-CM

## 2017-05-13 DIAGNOSIS — Z9884 Bariatric surgery status: Secondary | ICD-10-CM

## 2017-05-13 DIAGNOSIS — Z818 Family history of other mental and behavioral disorders: Secondary | ICD-10-CM

## 2017-05-13 DIAGNOSIS — F410 Panic disorder [episodic paroxysmal anxiety] without agoraphobia: Secondary | ICD-10-CM

## 2017-05-13 DIAGNOSIS — S0990XA Unspecified injury of head, initial encounter: Secondary | ICD-10-CM

## 2017-05-13 DIAGNOSIS — H539 Unspecified visual disturbance: Secondary | ICD-10-CM

## 2017-05-13 MED ORDER — HYDROXYZINE HCL 50 MG PO TABS
50.0000 mg | ORAL_TABLET | Freq: Every day | ORAL | Status: DC
  Administered 2017-05-13 – 2017-05-14 (×2): 50 mg via ORAL
  Filled 2017-05-13 (×5): qty 1

## 2017-05-13 MED ORDER — OLANZAPINE 5 MG PO TBDP
5.0000 mg | ORAL_TABLET | Freq: Every day | ORAL | Status: DC
  Administered 2017-05-13 – 2017-05-14 (×2): 5 mg via ORAL
  Filled 2017-05-13 (×5): qty 1

## 2017-05-13 NOTE — Progress Notes (Signed)
Recreation Therapy Notes  Animal-Assisted Activity (AAA) Program Checklist/Progress Notes Patient Eligibility Criteria Checklist & Daily Group note for Rec TxIntervention  Date: 05/13/2017 Time: 2:55pm Location: 12 hall dayroom   AAA/T Program Assumption of Risk Form signed by Patient/ or Parent Legal Guardian Yes  Patient is free of allergies or sever asthma Yes  Patient reports no fear of animals Yes  Patient reports no history of cruelty to animals Yes  Patient understands his/her participation is voluntary Yes  Patient washes hands before animal contact Yes  Patient washes hands after animal contact Yes  Behavioral Response: Appropriate  Education:Hand Washing, Appropriate Animal Interaction   Education Outcome: Acknowledges education.   Clinical Observations/Feedback: Patient attended session and interacted appropriately with therapy dog and peers.    Donovan Kail, Recreational Therapy Intern        Donovan Kail 05/13/2017 3:24 PM

## 2017-05-13 NOTE — Tx Team (Signed)
Interdisciplinary Treatment and Diagnostic Plan Update  05/13/2017 Time of Session: 0930 Marvin Espinoza MRN: [id number]  Principal Diagnosis: Severe major depression ([place])  Secondary Diagnoses: Principal Problem:   Severe major depression ([place]) Active Problems:   Affective psychosis, bipolar ([place])   Current Medications:  Current Facility-Administered Medications  Medication Dose Route Frequency Provider Last Rate Last Dose  . acetaminophen (TYLENOL) tablet 650 mg  650 mg Oral Q6H PRN Ethelene Hal, NP      . BARIATRIC MULTIVITAMINS/IRON CAPS 18 mg  18 mg Oral QHS Cobos, Myer Peer, MD   18 mg at 05/12/17 2113  . calcium citrate-vitamin D 500-500 MG-UNIT per chewable tablet 1 tablet  1 tablet Oral BID AC & HS Derrill Center, NP   1 tablet at 05/12/17 2113  . carbamazepine (TEGRETOL) tablet 200 mg  200 mg Oral TID Artist Beach, MD   200 mg at 05/13/17 8588  . cyclobenzaprine (FLEXERIL) tablet 10 mg  10 mg Oral TID PRN Ethelene Hal, NP   10 mg at 05/11/17 2212  . hydrOXYzine (ATARAX/VISTARIL) tablet 25 mg  25 mg Oral Q6H PRN Ethelene Hal, NP   25 mg at 05/13/17 0415  . lamoTRIgine (LAMICTAL) tablet 50 mg  50 mg Oral BID Ethelene Hal, NP   50 mg at 05/13/17 5027  . levothyroxine (SYNTHROID, LEVOTHROID) tablet 100 mcg  100 mcg Oral QAC breakfast Ethelene Hal, NP   100 mcg at 05/13/17 7412  . Methylcobalamin TBDP 5,000 mcg  5,000 mcg Oral Daily Cobos, Myer Peer, MD   5,000 mcg at 05/12/17 0620  . mirtazapine (REMERON) tablet 15 mg  15 mg Oral QHS Izediuno, Laruth Bouchard, MD   15 mg at 05/12/17 2113  . multivitamin with minerals tablet 1 tablet  1 tablet Oral BID AC & HS Derrill Center, NP   1 tablet at 05/12/17 2113  . pantoprazole (PROTONIX) EC tablet 40 mg  40 mg Oral Daily Ethelene Hal, NP   40 mg at 05/12/17 0802  . traZODone (DESYREL) tablet 50 mg  50 mg Oral QHS PRN Ethelene Hal, NP   50 mg at 05/10/17 2303   PTA  Medications: Prescriptions Prior to Admission  Medication Sig Dispense Refill Last Dose  . ALPRAZolam (XANAX) 1 MG tablet Take 1 tablet (1 mg total) by mouth 3 (three) times daily as needed for anxiety. 30 tablet 5 05/09/2017 at Unknown time  . calcium-vitamin D (OSCAL WITH D) 500-200 MG-UNIT tablet Take 1 tablet by mouth 2 (two) times daily.   05/09/2017 at Unknown time  . Colchicine 0.6 MG CAPS Take 0.6 mg by mouth 2 (two) times daily. (Patient taking differently: Take 0.6 mg by mouth 2 (two) times daily as needed (gout). ) 40 capsule 2 unk  . Cyanocobalamin (VITAMIN B-12 PO) Take 1 tablet by mouth daily.   05/09/2017 at Unknown time  . cyclobenzaprine (FLEXERIL) 10 MG tablet Take 10 mg by mouth 3 (three) times daily as needed for muscle spasms.   months  . lamoTRIgine (LAMICTAL) 25 MG tablet TAKE 1 TO 2 TABLETS TWICE DAILY (Patient taking differently: TAKE 2 TABLETS TWICE DAILY) 120 tablet 5 05/09/2017 at am  . levothyroxine (SYNTHROID, LEVOTHROID) 100 MCG tablet TAKE ONE (1) TABLET EACH DAY 30 tablet 9 05/09/2017 at Unknown time  . meclizine (ANTIVERT) 25 MG tablet Take 1 tablet (25 mg total) by mouth 3 (three) times daily as needed for dizziness. 90 tablet 2 05/09/2017 at Unknown  time  . Multiple Vitamin (MULTIVITAMIN WITH MINERALS) TABS tablet Take 1 tablet by mouth 2 (two) times daily.   05/09/2017 at Unknown time  . omeprazole (PRILOSEC) 40 MG capsule TAKE ONE (1) CAPSULE EACH DAY 30 capsule 2 05/09/2017 at Unknown time  . promethazine (PHENERGAN) 25 MG tablet Take 1 tablet (25 mg total) by mouth every 6 (six) hours as needed for nausea or vomiting. 60 tablet 2 unk  . traMADol (ULTRAM) 50 MG tablet Take 1 tablet (50 mg total) by mouth every 6 (six) hours as needed. (Patient taking differently: Take 50 mg by mouth every 6 (six) hours as needed for moderate pain. ) 120 tablet 5 unk  . VRAYLAR 1.5 MG CAPS Take 3 mg by mouth at bedtime.   0 05/08/2017 at Unknown time    Patient Stressors: Financial  difficulties Medication change or noncompliance  Patient Strengths: Average or above average intelligence Capable of independent living General fund of knowledge  Treatment Modalities: Medication Management, Group therapy, Case management,  1 to 1 session with clinician, Psychoeducation, Recreational therapy.   Physician Treatment Plan for Primary Diagnosis: Severe major depression ([place]) Long Term Goal(s): Improvement in symptoms so as ready for discharge Improvement in symptoms so as ready for discharge   Short Term Goals: Ability to identify changes in lifestyle to reduce recurrence of condition will improve Ability to verbalize feelings will improve Ability to disclose and discuss suicidal ideas Ability to demonstrate self-control will improve Ability to identify and develop effective coping behaviors will improve Ability to maintain clinical measurements within normal limits will improve Compliance with prescribed medications will improve Ability to identify triggers associated with substance abuse/mental health issues will improve Ability to identify changes in lifestyle to reduce recurrence of condition will improve Ability to verbalize feelings will improve Ability to disclose and discuss suicidal ideas Ability to demonstrate self-control will improve Ability to identify and develop effective coping behaviors will improve Ability to maintain clinical measurements within normal limits will improve Compliance with prescribed medications will improve Ability to identify triggers associated with substance abuse/mental health issues will improve  Medication Management: Evaluate patient's response, side effects, and tolerance of medication regimen.  Therapeutic Interventions: 1 to 1 sessions, Unit Group sessions and Medication administration.  Evaluation of Outcomes: Progressing  Physician Treatment Plan for Secondary Diagnosis: Principal Problem:   Severe major depression  ([place]) Active Problems:   Affective psychosis, bipolar ([place])  Long Term Goal(s): Improvement in symptoms so as ready for discharge Improvement in symptoms so as ready for discharge   Short Term Goals: Ability to identify changes in lifestyle to reduce recurrence of condition will improve Ability to verbalize feelings will improve Ability to disclose and discuss suicidal ideas Ability to demonstrate self-control will improve Ability to identify and develop effective coping behaviors will improve Ability to maintain clinical measurements within normal limits will improve Compliance with prescribed medications will improve Ability to identify triggers associated with substance abuse/mental health issues will improve Ability to identify changes in lifestyle to reduce recurrence of condition will improve Ability to verbalize feelings will improve Ability to disclose and discuss suicidal ideas Ability to demonstrate self-control will improve Ability to identify and develop effective coping behaviors will improve Ability to maintain clinical measurements within normal limits will improve Compliance with prescribed medications will improve Ability to identify triggers associated with substance abuse/mental health issues will improve     Medication Management: Evaluate patient's response, side effects, and tolerance of medication regimen.  Therapeutic Interventions: 1  to 1 sessions, Unit Group sessions and Medication administration.  Evaluation of Outcomes: Progressing   RN Treatment Plan for Primary Diagnosis: Severe major depression ([place]) Long Term Goal(s): Knowledge of disease and therapeutic regimen to maintain health will improve  Short Term Goals: Ability to remain free from injury will improve, Ability to verbalize feelings will improve and Ability to disclose and discuss suicidal ideas  Medication Management: RN will administer medications as ordered by provider, will assess and  evaluate patient's response and provide education to patient for prescribed medication. RN will report any adverse and/or side effects to prescribing provider.  Therapeutic Interventions: 1 on 1 counseling sessions, Psychoeducation, Medication administration, Evaluate responses to treatment, Monitor vital signs and CBGs as ordered, Perform/monitor CIWA, COWS, AIMS and Fall Risk screenings as ordered, Perform wound care treatments as ordered.  Evaluation of Outcomes: Progressing   LCSW Treatment Plan for Primary Diagnosis: Severe major depression ([place]) Long Term Goal(s): Safe transition to appropriate next level of care at discharge, Engage patient in therapeutic group addressing interpersonal concerns.  Short Term Goals: Engage patient in aftercare planning with referrals and resources, Facilitate patient progression through stages of change regarding substance use diagnoses and concerns and Identify triggers associated with mental health/substance abuse issues  Therapeutic Interventions: Assess for all discharge needs, 1 to 1 time with Social worker, Explore available resources and support systems, Assess for adequacy in community support network, Educate family and significant other(s) on suicide prevention, Complete Psychosocial Assessment, Interpersonal group therapy.  Evaluation of Outcomes: Progressing   Progress in Treatment: Attending groups: Yes. Participating in groups: Yes. Taking medication as prescribed: Yes. Toleration medication: Yes. Family/Significant other contact made: No, will contact:  family member/wife Patient understands diagnosis: Yes. Discussing patient identified problems/goals with staff: Yes. Medical problems stabilized or resolved: Yes. Denies suicidal/homicidal ideation: Yes. Issues/concerns per patient self-inventory: No. Other: n/a   New problem(s) identified: No, Describe:  n/a   New Short Term/Long Term Goal(s): Detox, medication stabilization,  development of comprehensive mental wellness/sobriety plan.   Discharge Plan or Barriers: CSW assessing for appropriate referrals--pt is adamant that he does not want to return to [facility] in [place], [place].   Reason for Continuation of Hospitalization: Depression Medication stabilization Suicidal ideation Withdrawal symptoms  Estimated Length of Stay: 3-5 days   Attendees: Patient: 05/13/2017 8:46 AM  Physician: Dr. Shea Evans MD 05/13/2017 8:46 AM  Nursing: Nicoletta Dress RN 05/13/2017 8:46 AM  RN Care Manager: Lars Pinks CM 05/13/2017 8:46 AM  Social Worker: Maxie Better, [place] 05/13/2017 8:46 AM  Recreational Therapist: x 05/13/2017 8:46 AM  Other: Lindell Spar NP; Samuel Jester NP 05/13/2017 8:46 AM  Other:  05/13/2017 8:46 AM  Other: 05/13/2017 8:46 AM    Scribe for Treatment Team: [place], LCSW 05/13/2017 8:46 AM

## 2017-05-13 NOTE — Progress Notes (Signed)
D: Pt A & O X3. Denies SI, HI, AVH and pain. Visible in dayroom for scheduled groups. Rates his depression 0/10, hopelessness 0/10 and anxiety 2/10. Per report and as confirmed by pt "I had a bad reaction to the medicine I took last night, I think it's the one for sleep and it starts with R".  Reports good appetite, good concentration level and fair sleep. A: Scheduled medications administered as prescribed, without further drug reactions thus far. Emotional support and encouragement provided. Q 15 minutes safety checks maintained without self harm gestures or outburst to note at this time.  R: Pt took his medications as ordered. Denies adverse drug reactions. Pt attended scheduled unit groups as encouraged this this shift. POC continues for safety and mood stability.

## 2017-05-13 NOTE — Progress Notes (Signed)
[facility] MD Progress Note  05/13/2017 4:20 PM Marvin Espinoza  MRN:  [id number]  Subjective: Tymel reports, "I had a bad reaction to the medicine I took last night. I did not do well on Risperdal either. When I took Abilify in the past, the reaction was bad. My mind is constantly going at night. And with all the financial difficulties I'm facing, my mind is never at ease. And since my by-pass surgery, I don't think that my body is absorbing any medicine being giving to me because I threw them back up as soon as I took them. I know I need help".  Objective: 46 yo Caucasian male, married, lives with his family. Works as a Curator. Background history of mood disorder, PTSD and anxiety disorder. Presented to the ER via EMS. Level of consciousness was altered at presentation. Overdosed on ten tablets of Alprazolam. Reported to have been going through a lot of financial difficulties. Recently evaluated for dizziness and it has been impacting his ability to function at work. Chart reviewed today. Patient discussed at team Staff reports that he been pleasant. No behavioral issues. He participated at some of the unit groups and activities. No observable side effects.  Seen today. Not tolerating hisnight medications well. Says he broke in hives, took some Vistaril, did better. Did not sleet well last night. Feels less depressed. No suicidal thoughts. Appetite and energy is good.    Principal Problem: Severe major depression ([place])  Diagnosis:   Patient Active Problem List   Diagnosis Date Noted  . Severe major depression ([place]) [F32.2] 05/10/2017  . Affective psychosis, bipolar ([place]) [F31.9] 05/10/2017  . Head injury [S09.90XA] 05/06/2017  . Dizziness [R42] 05/06/2017  . Vision disturbance [H53.9] 05/06/2017  . History of concussion [Z87.820] 02/10/2017  . Post concussion syndrome [F07.81] 02/10/2017  . Mood disorder ([place]) [F39] 02/10/2017  . 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 Adjustment disorder  with anxious mood [F43.22] 01/24/2017  . GAD (generalized anxiety disorder) [F41.1] 01/24/2017  . Gastroesophageal reflux disease without esophagitis [K21.9] 11/19/2016  . Hypothyroidism [E03.9] 11/19/2016  . DDD (degenerative disc disease), lumbar [M51.36] 11/19/2016   Total Time spent with patient: 15 minutes  Past Psychiatric History: As in H&P  Past Medical History:  Past Medical History:  Diagnosis Date  . Anxiety   . Bipolar disorder ([place])   . CTS (carpal tunnel syndrome)   . Depression   . GERD (gastroesophageal reflux disease)   . H/O bariatric surgery   . Hypothyroidism   . Hypothyroidism   . Sleep apnea    uses CPAP nightly  . Sleep apnea    wears CPAP nightly    Past Surgical History:  Procedure Laterality Date  . ANAL FISSURE REPAIR    . BARIATRIC SURGERY    . CARPAL TUNNEL RELEASE Left 05/09/2015   Procedure: LEFT CARPAL TUNNEL RELEASE;  Surgeon: Daryll Brod, MD;  Location: [facility];  Service: Orthopedics;  Laterality: Left;  . CARPAL TUNNEL RELEASE Left 05-09-2015  . CARPAL TUNNEL RELEASE Right 06/20/2015   Procedure: RIGHT CARPAL TUNNEL RELEASE;  Surgeon: Daryll Brod, MD;  Location: [facility];  Service: Orthopedics;  Laterality: Right;  REGIONAL/FAB  . CHOLECYSTECTOMY     Family History:  Family History  Problem Relation Age of Onset  . COPD Mother   . Diabetes Mother   . Heart disease Mother   . Depression Mother   . COPD Father   . Heart disease Father   . Kidney disease  Father   . Mental illness Father   . Depression Father   . Mental illness Sister   . Depression Sister    Family Psychiatric  History: As in H&P  Social History:  History  Alcohol Use No     History  Drug Use No    Social History   Social History  . Marital status: Married    Spouse name: N/A  . Number of children: N/A  . Years of education: N/A   Social History Main Topics  . Smoking status: Never Smoker  . Smokeless tobacco: Never  Used  . Alcohol use No  . Drug use: No  . Sexual activity: Yes   Other Topics Concern  . None   Social History Narrative   ** Merged History Encounter **       Additional Social History:    Pain Medications: see mar Prescriptions: SEE MAR History of alcohol / drug use?: No history of alcohol / drug abuse  Sleep: Poor  Appetite:  Good  Current Medications: Current Facility-Administered Medications  Medication Dose Route Frequency Provider Last Rate Last Dose  . acetaminophen (TYLENOL) tablet 650 mg  650 mg Oral Q6H PRN Ethelene Hal, NP      . BARIATRIC MULTIVITAMINS/IRON CAPS 18 mg  18 mg Oral QHS Cobos, Myer Peer, MD   18 mg at 05/12/17 2113  . calcium citrate-vitamin D 500-500 MG-UNIT per chewable tablet 1 tablet  1 tablet Oral BID AC & HS Derrill Center, NP   1 tablet at 05/13/17 [phone]  . carbamazepine (TEGRETOL) tablet 200 mg  200 mg Oral TID Artist Beach, MD   200 mg at 05/13/17 1209  . cyclobenzaprine (FLEXERIL) tablet 10 mg  10 mg Oral TID PRN Ethelene Hal, NP   10 mg at 05/11/17 2212  . hydrOXYzine (ATARAX/VISTARIL) tablet 25 mg  25 mg Oral Q6H PRN Ethelene Hal, NP   25 mg at 05/13/17 0415  . hydrOXYzine (ATARAX/VISTARIL) tablet 50 mg  50 mg Oral QHS Nwoko, Agnes I, NP      . lamoTRIgine (LAMICTAL) tablet 50 mg  50 mg Oral BID Ethelene Hal, NP   50 mg at 05/13/17 9798  . levothyroxine (SYNTHROID, LEVOTHROID) tablet 100 mcg  100 mcg Oral QAC breakfast Ethelene Hal, NP   100 mcg at 05/13/17 9211  . Methylcobalamin TBDP 5,000 mcg  5,000 mcg Oral Daily Cobos, Myer Peer, MD   5,000 mcg at 05/13/17 0813  . multivitamin with minerals tablet 1 tablet  1 tablet Oral BID AC & HS Derrill Center, NP   1 tablet at 05/13/17 [phone]  . OLANZapine zydis (ZYPREXA) disintegrating tablet 5 mg  5 mg Oral QHS Nwoko, Agnes I, NP      . pantoprazole (PROTONIX) EC tablet 40 mg  40 mg Oral Daily Ethelene Hal, NP   40 mg at 05/13/17 4081     Lab Results: No results found for this or any previous visit (from the past 48 hour(s)).  Blood Alcohol level:  Lab Results  Component Value Date   ETH <5 44/81/8563   Metabolic Disorder Labs: No results found for: HGBA1C, MPG No results found for: PROLACTIN Lab Results  Component Value Date   CHOL 111 04/25/2017   TRIG 39 04/25/2017   HDL 47 04/25/2017   CHOLHDL 2.4 04/25/2017   LDLCALC 56 04/25/2017   Physical Findings: AIMS: Facial and Oral Movements Muscles of Facial Expression: None, normal Lips  and Perioral Area: None, normal Jaw: None, normal Tongue: None, normal,Extremity Movements Upper (arms, wrists, hands, fingers): None, normal Lower (legs, knees, ankles, toes): None, normal, Trunk Movements Neck, shoulders, hips: None, normal, Overall Severity Severity of abnormal movements (highest score from questions above): None, normal Incapacitation due to abnormal movements: None, normal Patient's awareness of abnormal movements (rate only patient's report): No Awareness, Dental Status Current problems with teeth and/or dentures?: No Does patient usually wear dentures?: No  CIWA:  CIWA-Ar Total: 0 COWS:     Musculoskeletal: Strength & Muscle Tone: within normal limits Gait & Station: normal Patient leans: N/A  Psychiatric Specialty Exam: Physical Exam  Constitutional: He is oriented to person, place, and time. He appears well-developed and well-nourished.  HENT:  Head: Normocephalic and atraumatic.  Eyes: Pupils are equal, round, and reactive to light.  Neck: Normal range of motion. Neck supple.  Cardiovascular: Normal rate and regular rhythm.   Respiratory: Effort normal and breath sounds normal.  GI: Soft. Bowel sounds are normal.  Musculoskeletal: Normal range of motion.  Neurological: He is alert and oriented to person, place, and time.  Skin: Skin is warm and dry.  Psychiatric:  As above    ROS  Blood pressure 120/83, pulse 89, temperature 98.4  F (36.9 C), temperature source Oral, resp. rate 18, height 5\' 9"  (1.753 m), weight 84.4 kg (186 lb), SpO2 100 %.Body mass index is 27.47 kg/m.  General Appearance: In hospital clothing, pleasant, calm and cooperative. Appropriate behavior.   Eye Contact:  Good  Speech:  Clear and Coherent and Normal Rate  Volume:  Normal  Mood:  Euthymic  Affect:  Appropriate and Full Range  Thought Process:  Linear  Orientation:  Full (Time, Place, and Person)  Thought Content:  Less ruminations about his financial issues. No delusional theme. No preoccupation with violent thoughts. No obsession.  No hallucination in any modality.   Suicidal Thoughts:  No  Homicidal Thoughts:  No  Memory:  Immediate;   Good Recent;   Good Remote;   Good  Judgement:  Good  Insight:  Good  Psychomotor Activity:  Normal  Concentration:  Concentration: Good and Attention Span: Good  Recall:  Good  Fund of Knowledge:  Good  Language:  Good  Akathisia:  Negative  Handed:    AIMS (if indicated):     Assets:  Communication Skills Desire for [place] Talents/Skills Vocational/Educational  ADL's:  Fair  Cognition:  WNL  Sleep:  Number of Hours: 4.25   Treatment Plan Summary: Patient says he did not tolerate his recent medication that he took last night well. He says he broke out in hives, took some Vistaril, did better.  Says did not sleep at all last  night. He seem to be in good spirits today. He needs further evaluation and collateral from his family. Wants to try any antipsychotic that can dissolve under the tongue. Says have had problems with absorption since his by-pass surgery.  Will continue today 05/13/17 plan as below except where it is noted.  Psychiatric: Unipolar Depression Panic Disorder  Medical: Bariatric surgery Past history of concussion  Psychosocial:  Financial difficulties  PLAN: 1. Continue current  regimen: Initiated Zyprexa Zydis 5 mg Q hs for mood control.  Continue Lamictal 50 mg, monitor for any rashes. Initiate Vistaril 50 mg Q hs for insomnia.  Continue Hydroxyzine 25 mg prn for anxiety,  Continue Tegretol 200 mg tid for mood stabilization. Discontinue Remeron, patient  blamed for breaking out in hives after ingestion.  2. Continue to monitor mood, behavior and interaction with peers. 3. Collateral from his wife  Encarnacion Slates, NP, 75, FNP-BC. 05/13/2017, 4:20 PMPatient ID: Marvin Espinoza, male   DOB: Mar 09, 1971, 46 y.o.   MRN: [id number]

## 2017-05-13 NOTE — [facility] Group Notes (Signed)
[facility] Group Notes:  (Nursing)  Date:  05/13/2017  Time:  1000 Type of Therapy:  Nurse Education  Participation Level:  Active  Participation Quality:  Appropriate, Attentive and Supportive  Affect:  Appropriate  Cognitive:  Alert and Oriented  Insight:  Appropriate and Good  Engagement in Group:  Engaged and Supportive  Modes of Intervention:  Discussion, Education and Support  Summary of Progress/Problems: Engaged in group discussion.  Meryle Ready, Nicoletta Dress 05/13/2017, 1000

## 2017-05-13 NOTE — Progress Notes (Signed)
Adult Psychoeducational Group Note  Date:  05/13/2017 Time:  11:24 PM  Group Topic/Focus:  Wrap-Up Group:   The focus of this group is to help patients review their daily goal of treatment and discuss progress on daily workbooks.  Participation Level:  Did Not Attend  Additional Comments: Pt did not attend group.  Marvin Espinoza 05/13/2017, 11:24 PM

## 2017-05-13 NOTE — Progress Notes (Signed)
D: Patient seen in his room awake. Stated "am ok. Just wanna be here". Denies pain, SI/HI, AH/VH at this time. Rated depression and anxiety 0/10. Verbalizes no concern. No behavior issues noted.   A: Staff offered support and encouragement to participate in group tonight. Due med given as ordered. Routine safety checks maintained. Will continue to monitor patient.   R: Patient is safe on unit.

## 2017-05-14 MED ORDER — BISACODYL 5 MG PO TBEC
10.0000 mg | DELAYED_RELEASE_TABLET | Freq: Once | ORAL | Status: AC
  Administered 2017-05-14: 10 mg via ORAL
  Filled 2017-05-14: qty 2

## 2017-05-14 NOTE — Progress Notes (Signed)
Recreation Therapy Notes  Date: 05/14/17 Time: 0930 Location: 300 Hall Dayroom  Group Topic: Stress Management  Goal Area(s) Addresses:  Patient will verbalize importance of using healthy stress management.  Patient will identify positive emotions associated with healthy stress management.   Behavioral Response: Engaged  Intervention: Stress Management  Activity :  Body Scan Meditation.  LRT introduced the stress management technique of meditation.  LRT played a meditation to allow patients to take inventory of the sensations they are feeling in their body.  Patients were to follow along as the meditation was played to fully engage in the technique.  Education:  Stress Management, Discharge Planning.   Education Outcome: Acknowledges edcuation/In group clarification offered/Needs additional education  Clinical Observations/Feedback: Pt attended group.   Victorino Sparrow, LRT/CTRS         Victorino Sparrow A 05/14/2017 11:26 AM

## 2017-05-14 NOTE — [facility] Suicide Risk Assessment (Signed)
[place] INPATIENT:  Family/Significant Other Suicide Prevention Education  Suicide Prevention Education:  Education Completed; Nithin Demeo (pt's wife) [phone] has been identified by the patient as the family member/significant other with whom the patient will be residing, and identified as the person(s) who will aid the patient in the event of a mental health crisis (suicidal ideations/suicide attempt).  With written consent from the patient, the family member/significant other has been provided the following suicide prevention education, prior to the and/or following the discharge of the patient.  The suicide prevention education provided includes the following:  Suicide risk factors  Suicide prevention and interventions  National Suicide Hotline telephone number  [facility] assessment telephone number  [facility] Emergency Assistance [place] and/or Residential Mobile Crisis Unit telephone number  Request made of family/significant other to:  Remove weapons (e.g., guns, rifles, knives), all items previously/currently identified as safety concern.    Remove drugs/medications (over-the-counter, prescriptions, illicit drugs), all items previously/currently identified as a safety concern.  The family member/significant other verbalizes understanding of the suicide prevention education information provided.  The family member/significant other agrees to remove the items of safety concern listed above.  Pt's wife will be locking up guns/hunting tools until she feels that pt shows consistency with his mood stability. She reports no concerns regarding SI with pt and is hoping for him to discharge soon. After care and SPE reviewed with him. Family support information also provided to pt.    Kimber Relic Smart LCSW 05/14/2017, 3:34 PM

## 2017-05-14 NOTE — [facility] Group Notes (Signed)
[place] LCSW Group Therapy  05/14/2017 12:54 PM  Type of Therapy:  Group Therapy  Participation Level:  Active  Participation Quality:  Attentive  Affect:  Appropriate  Cognitive:  Alert and Oriented  Insight:  Engaged  Engagement in Therapy:  Engaged  Modes of Intervention:  Confrontation, Discussion, Education, Problem-solving, Socialization and Support  Summary of Progress/Problems: Today's Topic: Overcoming Obstacles. Patients identified one short term goal and potential obstacles in reaching this goal. Patients processed barriers involved in overcoming these obstacles. Patients identified steps necessary for overcoming these obstacles and explored motivation (internal and external) for facing these difficulties head on.   Marvin Espinoza N Smart LCSW 05/14/2017, 12:54 PM

## 2017-05-14 NOTE — Progress Notes (Signed)
Patient ID: Marvin Espinoza, male   DOB: 1971-04-30, 46 y.o.   MRN: [id number]  DAR: Pt. Denies SI/HI and A/V Hallucinations. He reports sleep is good, appetite is good, energy level is normal, and concentration is good. He rates depression 2/10, hopelessness 0/10, and anxiety 2/10. Patient does not report any pain at this time. Support and encouragement provided to the patient. Scheduled medications administered to patient per physician's orders. Patient reports constipation and received Ducolax which patient reported provided results. Patient is cooperative and seen in the milieu interacting with peers. Q15 minute checks are maintained for safety.

## 2017-05-14 NOTE — Progress Notes (Signed)
[facility] MD Progress Note  05/14/2017 10:52 AM Marvin Espinoza  MRN:  [id number]  Subjective: Marvin Espinoza stated that he has been feeling much better with his new medication and relieved constipation. I am doing fine and wants to be discharged may in next few days. He minimizes his overdose as per sleep but not for hurting himself. 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 I take three stool softener daily since surgery.   Objective: 46 yo Caucasian male, married, lives with his family. Works as a Curator. Background history of mood disorder, PTSD and anxiety disorder. Presented to the ER via EMS. Level of consciousness was altered at presentation. Overdosed on ten tablets of Alprazolam. Reported to have been going through a lot of financial difficulties. Recently evaluated for dizziness and it has been impacting his ability to function at work.  Chart reviewed today. Patient discussed at team. Staff reports that he been pleasant. No behavioral issues. He participated at some of the unit groups and activities. No observable side effects. He is actively participating in treatment program and made few friends.He feels he has good support from family. Tolerating his night medications well and slept well. Feels less depressed and anxiety. No suicidal thoughts. Appetite and energy is good.    Principal Problem: Severe major depression ([place])  Diagnosis:   Patient Active Problem List   Diagnosis Date Noted  . Severe major depression ([place]) [F32.2] 05/10/2017  . Affective psychosis, bipolar ([place]) [F31.9] 05/10/2017  . Head injury [S09.90XA] 05/06/2017  . Dizziness [R42] 05/06/2017  . Vision disturbance [H53.9] 05/06/2017  . History of concussion [Z87.820] 02/10/2017  . Post concussion syndrome [F07.81] 02/10/2017  . Mood disorder ([place]) [F39] 02/10/2017  . 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 Adjustment disorder with anxious mood [F43.22] 01/24/2017  . GAD (generalized anxiety disorder)  [F41.1] 01/24/2017  . Gastroesophageal reflux disease without esophagitis [K21.9] 11/19/2016  . Hypothyroidism [E03.9] 11/19/2016  . DDD (degenerative disc disease), lumbar [M51.36] 11/19/2016   Total Time spent with patient: 15 minutes  Past Psychiatric History: As in H&P  Past Medical History:  Past Medical History:  Diagnosis Date  . Anxiety   . Bipolar disorder ([place])   . CTS (carpal tunnel syndrome)   . Depression   . GERD (gastroesophageal reflux disease)   . H/O bariatric surgery   . Hypothyroidism   . Hypothyroidism   . Sleep apnea    uses CPAP nightly  . Sleep apnea    wears CPAP nightly    Past Surgical History:  Procedure Laterality Date  . ANAL FISSURE REPAIR    . BARIATRIC SURGERY    . CARPAL TUNNEL RELEASE Left 05/09/2015   Procedure: LEFT CARPAL TUNNEL RELEASE;  Surgeon: Daryll Brod, MD;  Location: [place];  Service: Orthopedics;  Laterality: Left;  . CARPAL TUNNEL RELEASE Left 05-09-2015  . CARPAL TUNNEL RELEASE Right 06/20/2015   Procedure: RIGHT CARPAL TUNNEL RELEASE;  Surgeon: Daryll Brod, MD;  Location: [place];  Service: Orthopedics;  Laterality: Right;  REGIONAL/FAB  . CHOLECYSTECTOMY     Family History:  Family History  Problem Relation Age of Onset  . COPD Mother   . Diabetes Mother   . Heart disease Mother   . Depression Mother   . COPD Father   . Heart disease Father   . Kidney disease Father   . Mental illness Father   . Depression Father   . Mental illness Sister   . Depression Sister  Family Psychiatric  History: As in H&P  Social History:  History  Alcohol Use No     History  Drug Use No    Social History   Social History  . Marital status: Married    Spouse name: N/A  . Number of children: N/A  . Years of education: N/A   Social History Main Topics  . Smoking status: Never Smoker  . Smokeless tobacco: Never Used  . Alcohol use No  . Drug use: No  . Sexual activity: Yes   Other  Topics Concern  . None   Social History Narrative   ** Merged History Encounter **       Additional Social History:    Pain Medications: see mar Prescriptions: SEE MAR History of alcohol / drug use?: No history of alcohol / drug abuse  Sleep: Poor  Appetite:  Good  Current Medications: Current Facility-Administered Medications  Medication Dose Route Frequency Provider Last Rate Last Dose  . acetaminophen (TYLENOL) tablet 650 mg  650 mg Oral Q6H PRN Ethelene Hal, NP      . BARIATRIC MULTIVITAMINS/IRON CAPS 18 mg  18 mg Oral QHS Cobos, Myer Peer, MD   18 mg at 05/13/17 2202  . calcium citrate-vitamin D 500-500 MG-UNIT per chewable tablet 1 tablet  1 tablet Oral BID AC & HS Derrill Center, NP   1 tablet at 05/14/17 [phone]  . carbamazepine (TEGRETOL) tablet 200 mg  200 mg Oral TID Artist Beach, MD   200 mg at 05/14/17 0051  . cyclobenzaprine (FLEXERIL) tablet 10 mg  10 mg Oral TID PRN Ethelene Hal, NP   10 mg at 05/11/17 2212  . hydrOXYzine (ATARAX/VISTARIL) tablet 25 mg  25 mg Oral Q6H PRN Ethelene Hal, NP   25 mg at 05/13/17 0415  . hydrOXYzine (ATARAX/VISTARIL) tablet 50 mg  50 mg Oral QHS Lindell Spar I, NP   50 mg at 05/13/17 2110  . lamoTRIgine (LAMICTAL) tablet 50 mg  50 mg Oral BID Ethelene Hal, NP   50 mg at 05/14/17 1021  . levothyroxine (SYNTHROID, LEVOTHROID) tablet 100 mcg  100 mcg Oral QAC breakfast Ethelene Hal, NP   100 mcg at 05/14/17 1173  . Methylcobalamin TBDP 5,000 mcg  5,000 mcg Oral Daily Cobos, Myer Peer, MD   5,000 mcg at 05/14/17 [phone]  . multivitamin with minerals tablet 1 tablet  1 tablet Oral BID AC & HS Derrill Center, NP   1 tablet at 05/14/17 0617  . OLANZapine zydis (ZYPREXA) disintegrating tablet 5 mg  5 mg Oral QHS Nwoko, Agnes I, NP   5 mg at 05/13/17 2110  . pantoprazole (PROTONIX) EC tablet 40 mg  40 mg Oral Daily Ethelene Hal, NP   40 mg at 05/14/17 1410    Lab Results: No results found  for this or any previous visit (from the past 48 hour(s)).  Blood Alcohol level:  Lab Results  Component Value Date   ETH <5 30/13/1438   Metabolic Disorder Labs: No results found for: HGBA1C, MPG No results found for: PROLACTIN Lab Results  Component Value Date   CHOL 111 04/25/2017   TRIG 39 04/25/2017   HDL 47 04/25/2017   CHOLHDL 2.4 04/25/2017   LDLCALC 56 04/25/2017   Physical Findings: AIMS: Facial and Oral Movements Muscles of Facial Expression: None, normal Lips and Perioral Area: None, normal Jaw: None, normal Tongue: None, normal,Extremity Movements Upper (arms, wrists, hands, fingers): None, normal Lower (  legs, knees, ankles, toes): None, normal, Trunk Movements Neck, shoulders, hips: None, normal, Overall Severity Severity of abnormal movements (highest score from questions above): None, normal Incapacitation due to abnormal movements: None, normal Patient's awareness of abnormal movements (rate only patient's report): No Awareness, Dental Status Current problems with teeth and/or dentures?: No Does patient usually wear dentures?: No  CIWA:  CIWA-Ar Total: 0 COWS:     Musculoskeletal: Strength & Muscle Tone: within normal limits Gait & Station: normal Patient leans: N/A  Psychiatric Specialty Exam: Physical Exam  Constitutional: He is oriented to person, place, and time. He appears well-developed and well-nourished.  HENT:  Head: Normocephalic and atraumatic.  Eyes: Pupils are equal, round, and reactive to light.  Neck: Normal range of motion. Neck supple.  Cardiovascular: Normal rate and regular rhythm.   Respiratory: Effort normal and breath sounds normal.  GI: Soft. Bowel sounds are normal.  Musculoskeletal: Normal range of motion.  Neurological: He is alert and oriented to person, place, and time.  Skin: Skin is warm and dry.  Psychiatric:  As above    ROS  Blood pressure 115/84, pulse 87, temperature 97.8 F (36.6 C), temperature source  Oral, resp. rate 16, height 5\' 9"  (1.753 m), weight 84.4 kg (186 lb), SpO2 100 %.Body mass index is 27.47 kg/m.  General Appearance: pleasant, calm, cooperative. Appropriate behavior.   Eye Contact:  Good  Speech:  Clear and Coherent and Normal Rate  Volume:  Normal  Mood:  Euthymic  Affect:  Appropriate and Full Range  Thought Process:  Linear  Orientation:  Full (Time, Place, and Person)  Thought Content:  Less ruminations about his financial issues. No delusional theme. No preoccupation with violent thoughts. No obsession.  No hallucination in any modality.   Suicidal Thoughts:  No  Homicidal Thoughts:  No  Memory:  Immediate;   Good Recent;   Good Remote;   Good  Judgement:  Good  Insight:  Good  Psychomotor Activity:  Normal  Concentration:  Concentration: Good and Attention Span: Good  Recall:  Good  Fund of Knowledge:  Good  Language:  Good  Akathisia:  Negative  Handed:    AIMS (if indicated):     Assets:  Communication Skills Desire for [place] Talents/Skills Vocational/Educational  ADL's:  Fair  Cognition:  WNL  Sleep:  Number of Hours: 6   Treatment Plan Summary: Patient says he did not tolerate his recent medication that he took last night well. He says he broke out in hives, took some Vistaril, did better.  Says did not sleep at all last  night. He seem to be in good spirits today. He needs further evaluation and collateral from his family. Says have had problems with absorption since his by-pass surgery.  Will continue today 05/14/17 plan as below except where it is noted.  Psychiatric: Unipolar Depression Panic Disorder  Medical: Bariatric surgery Past history of concussion  Psychosocial:  Financial difficulties  PLAN: 1. Continue current regimen: Continue Zyprexa Zydis 5 mg Q hs for mood control.  Continue Lamictal 50 mg, monitor for any rashes. Continue Vistaril 50 mg Q hs for  insomnia.  Continue Hydroxyzine 25 mg prn for anxiety,  Continue Tegretol 200 mg tid for mood stabilization. 2. Continue to monitor mood, behavior and interaction with peers. 3. Has plans to go to mood center for out patient treatment at [facility], MD 05/14/2017, 10:52 AM

## 2017-05-15 MED ORDER — OLANZAPINE 5 MG PO TBDP: 5.0000 mg | ORAL_TABLET | Freq: Every day | ORAL | 0 refills | Status: DC

## 2017-05-15 MED ORDER — LEVOTHYROXINE SODIUM 100 MCG PO TABS: ORAL_TABLET | ORAL | 9 refills | Status: DC

## 2017-05-15 MED ORDER — OMEPRAZOLE 40 MG PO CPDR: DELAYED_RELEASE_CAPSULE | ORAL | 0 refills | Status: DC

## 2017-05-15 MED ORDER — METHYLCOBALAMIN 5000 MCG PO TBDP: 5000.0000 ug | ORAL_TABLET | Freq: Every day | ORAL | 0 refills | Status: DC

## 2017-05-15 MED ORDER — CARBAMAZEPINE 200 MG PO TABS: 200.0000 mg | ORAL_TABLET | Freq: Three times a day (TID) | ORAL | 0 refills | Status: DC

## 2017-05-15 MED ORDER — ADULT MULTIVITAMIN W/MINERALS CH: 1.0000 | ORAL_TABLET | Freq: Two times a day (BID) | ORAL | Status: AC

## 2017-05-15 MED ORDER — CYCLOBENZAPRINE HCL 10 MG PO TABS: 10.0000 mg | ORAL_TABLET | Freq: Three times a day (TID) | ORAL | 0 refills | Status: DC | PRN

## 2017-05-15 MED ORDER — HYDROXYZINE HCL 25 MG PO TABS: ORAL_TABLET | ORAL | 0 refills | Status: DC

## 2017-05-15 MED ORDER — CALCIUM CARBONATE-VITAMIN D 500-200 MG-UNIT PO TABS: 1.0000 | ORAL_TABLET | Freq: Two times a day (BID) | ORAL | Status: DC

## 2017-05-15 MED ORDER — LAMOTRIGINE 25 MG PO TABS: 50.0000 mg | ORAL_TABLET | Freq: Two times a day (BID) | ORAL | 0 refills | Status: DC

## 2017-05-15 NOTE — Progress Notes (Signed)
D   Pt is pleasant on approach and cooperative   His behavior and interactions with others is appropriate   He is making more positive statements about the future and reports much improvement since admission A    Verbal support given   Medications administered and effectiveness monitored     Q 15 min checks R    Pt is safe at present

## 2017-05-15 NOTE — Progress Notes (Signed)
Pt d/c from the hospital. All items returned. D/C instructions given and prescriptions given. Pt denies si and hi. 

## 2017-05-15 NOTE — Tx Team (Signed)
Interdisciplinary Treatment and Diagnostic Plan Update  05/15/2017 Time of Session: 0930 Steen Bisig MRN: [id number]  Principal Diagnosis: Severe major depression ([place])  Secondary Diagnoses: Principal Problem:   Severe major depression ([place]) Active Problems:   Affective psychosis, bipolar ([place])   Current Medications:  Current Facility-Administered Medications  Medication Dose Route Frequency Provider Last Rate Last Dose  . acetaminophen (TYLENOL) tablet 650 mg  650 mg Oral Q6H PRN Ethelene Hal, NP      . BARIATRIC MULTIVITAMINS/IRON CAPS 18 mg  18 mg Oral QHS Cobos, Myer Peer, MD   18 mg at 05/14/17 2106  . calcium citrate-vitamin D 500-500 MG-UNIT per chewable tablet 1 tablet  1 tablet Oral BID AC & HS Derrill Center, NP   1 tablet at 05/15/17 0610  . carbamazepine (TEGRETOL) tablet 200 mg  200 mg Oral TID Artist Beach, MD   200 mg at 05/14/17 1728  . cyclobenzaprine (FLEXERIL) tablet 10 mg  10 mg Oral TID PRN Ethelene Hal, NP   10 mg at 05/11/17 2212  . hydrOXYzine (ATARAX/VISTARIL) tablet 25 mg  25 mg Oral Q6H PRN Ethelene Hal, NP   25 mg at 05/13/17 0415  . hydrOXYzine (ATARAX/VISTARIL) tablet 50 mg  50 mg Oral QHS Lindell Spar I, NP   50 mg at 05/14/17 2105  . lamoTRIgine (LAMICTAL) tablet 50 mg  50 mg Oral BID Ethelene Hal, NP   50 mg at 05/14/17 1728  . levothyroxine (SYNTHROID, LEVOTHROID) tablet 100 mcg  100 mcg Oral QAC breakfast Ethelene Hal, NP   100 mcg at 05/15/17 7867  . Methylcobalamin TBDP 5,000 mcg  5,000 mcg Oral Daily Cobos, Myer Peer, MD   5,000 mcg at 05/15/17 0611  . multivitamin with minerals tablet 1 tablet  1 tablet Oral BID AC & HS Derrill Center, NP   1 tablet at 05/15/17 [phone]  . OLANZapine zydis (ZYPREXA) disintegrating tablet 5 mg  5 mg Oral QHS Nwoko, Agnes I, NP   5 mg at 05/14/17 2105  . pantoprazole (PROTONIX) EC tablet 40 mg  40 mg Oral Daily Ethelene Hal, NP   40 mg at 05/15/17 2010    PTA Medications: Prescriptions Prior to Admission  Medication Sig Dispense Refill Last Dose  . ALPRAZolam (XANAX) 1 MG tablet Take 1 tablet (1 mg total) by mouth 3 (three) times daily as needed for anxiety. 30 tablet 5 05/09/2017 at Unknown time  . calcium-vitamin D (OSCAL WITH D) 500-200 MG-UNIT tablet Take 1 tablet by mouth 2 (two) times daily.   05/09/2017 at Unknown time  . Colchicine 0.6 MG CAPS Take 0.6 mg by mouth 2 (two) times daily. (Patient taking differently: Take 0.6 mg by mouth 2 (two) times daily as needed (gout). ) 40 capsule 2 unk  . Cyanocobalamin (VITAMIN B-12 PO) Take 1 tablet by mouth daily.   05/09/2017 at Unknown time  . cyclobenzaprine (FLEXERIL) 10 MG tablet Take 10 mg by mouth 3 (three) times daily as needed for muscle spasms.   months  . lamoTRIgine (LAMICTAL) 25 MG tablet TAKE 1 TO 2 TABLETS TWICE DAILY (Patient taking differently: TAKE 2 TABLETS TWICE DAILY) 120 tablet 5 05/09/2017 at am  . levothyroxine (SYNTHROID, LEVOTHROID) 100 MCG tablet TAKE ONE (1) TABLET EACH DAY 30 tablet 9 05/09/2017 at Unknown time  . meclizine (ANTIVERT) 25 MG tablet Take 1 tablet (25 mg total) by mouth 3 (three) times daily as needed for dizziness. 90 tablet 2 05/09/2017 at  Unknown time  . Multiple Vitamin (MULTIVITAMIN WITH MINERALS) TABS tablet Take 1 tablet by mouth 2 (two) times daily.   05/09/2017 at Unknown time  . omeprazole (PRILOSEC) 40 MG capsule TAKE ONE (1) CAPSULE EACH DAY 30 capsule 2 05/09/2017 at Unknown time  . promethazine (PHENERGAN) 25 MG tablet Take 1 tablet (25 mg total) by mouth every 6 (six) hours as needed for nausea or vomiting. 60 tablet 2 unk  . traMADol (ULTRAM) 50 MG tablet Take 1 tablet (50 mg total) by mouth every 6 (six) hours as needed. (Patient taking differently: Take 50 mg by mouth every 6 (six) hours as needed for moderate pain. ) 120 tablet 5 unk  . VRAYLAR 1.5 MG CAPS Take 3 mg by mouth at bedtime.   0 05/08/2017 at Unknown time    Patient Stressors:  Financial difficulties Medication change or noncompliance  Patient Strengths: Average or above average intelligence Capable of independent living General fund of knowledge  Treatment Modalities: Medication Management, Group therapy, Case management,  1 to 1 session with clinician, Psychoeducation, Recreational therapy.   Physician Treatment Plan for Primary Diagnosis: Severe major depression ([place]) Long Term Goal(s): Improvement in symptoms so as ready for discharge Improvement in symptoms so as ready for discharge   Short Term Goals: Ability to identify changes in lifestyle to reduce recurrence of condition will improve Ability to verbalize feelings will improve Ability to disclose and discuss suicidal ideas Ability to demonstrate self-control will improve Ability to identify and develop effective coping behaviors will improve Ability to maintain clinical measurements within normal limits will improve Compliance with prescribed medications will improve Ability to identify triggers associated with substance abuse/mental health issues will improve Ability to identify changes in lifestyle to reduce recurrence of condition will improve Ability to verbalize feelings will improve Ability to disclose and discuss suicidal ideas Ability to demonstrate self-control will improve Ability to identify and develop effective coping behaviors will improve Ability to maintain clinical measurements within normal limits will improve Compliance with prescribed medications will improve Ability to identify triggers associated with substance abuse/mental health issues will improve  Medication Management: Evaluate patient's response, side effects, and tolerance of medication regimen.  Therapeutic Interventions: 1 to 1 sessions, Unit Group sessions and Medication administration.  Evaluation of Outcomes: Met  Physician Treatment Plan for Secondary Diagnosis: Principal Problem:   Severe major depression  ([place]) Active Problems:   Affective psychosis, bipolar ([place])  Long Term Goal(s): Improvement in symptoms so as ready for discharge Improvement in symptoms so as ready for discharge   Short Term Goals: Ability to identify changes in lifestyle to reduce recurrence of condition will improve Ability to verbalize feelings will improve Ability to disclose and discuss suicidal ideas Ability to demonstrate self-control will improve Ability to identify and develop effective coping behaviors will improve Ability to maintain clinical measurements within normal limits will improve Compliance with prescribed medications will improve Ability to identify triggers associated with substance abuse/mental health issues will improve Ability to identify changes in lifestyle to reduce recurrence of condition will improve Ability to verbalize feelings will improve Ability to disclose and discuss suicidal ideas Ability to demonstrate self-control will improve Ability to identify and develop effective coping behaviors will improve Ability to maintain clinical measurements within normal limits will improve Compliance with prescribed medications will improve Ability to identify triggers associated with substance abuse/mental health issues will improve     Medication Management: Evaluate patient's response, side effects, and tolerance of medication regimen.  Therapeutic Interventions:  1 to 1 sessions, Unit Group sessions and Medication administration.  Evaluation of Outcomes: Met   RN Treatment Plan for Primary Diagnosis: Severe major depression ([place]) Long Term Goal(s): Knowledge of disease and therapeutic regimen to maintain health will improve  Short Term Goals: Ability to remain free from injury will improve, Ability to verbalize feelings will improve and Ability to disclose and discuss suicidal ideas  Medication Management: RN will administer medications as ordered by provider, will assess and evaluate  patient's response and provide education to patient for prescribed medication. RN will report any adverse and/or side effects to prescribing provider.  Therapeutic Interventions: 1 on 1 counseling sessions, Psychoeducation, Medication administration, Evaluate responses to treatment, Monitor vital signs and CBGs as ordered, Perform/monitor CIWA, COWS, AIMS and Fall Risk screenings as ordered, Perform wound care treatments as ordered.  Evaluation of Outcomes: Met   LCSW Treatment Plan for Primary Diagnosis: Severe major depression ([place]) Long Term Goal(s): Safe transition to appropriate next level of care at discharge, Engage patient in therapeutic group addressing interpersonal concerns.  Short Term Goals: Engage patient in aftercare planning with referrals and resources, Facilitate patient progression through stages of change regarding substance use diagnoses and concerns and Identify triggers associated with mental health/substance abuse issues  Therapeutic Interventions: Assess for all discharge needs, 1 to 1 time with Social worker, Explore available resources and support systems, Assess for adequacy in community support network, Educate family and significant other(s) on suicide prevention, Complete Psychosocial Assessment, Interpersonal group therapy.  Evaluation of Outcomes: Met   Progress in Treatment: Attending groups: Yes. Participating in groups: Yes. Taking medication as prescribed: Yes. Toleration medication: Yes. Family/Significant other contact made: SPE completed with pt's wife.  Patient understands diagnosis: Yes. Discussing patient identified problems/goals with staff: Yes. Medical problems stabilized or resolved: Yes. Denies suicidal/homicidal ideation: Yes. Issues/concerns per patient self-inventory: No. Other: n/a   New problem(s) identified: No, Describe:  n/a   New Short Term/Long Term Goal(s): Detox, medication stabilization, development of comprehensive mental  wellness/sobriety plan.   Discharge Plan or Barriers: Pt wanted new referral--[facility] appt made. Pt also provided with mental [facility] pamphlet.   Reason for Continuation of Hospitalization: none  Estimated Length of Stay: discharge today   Attendees: Patient: 05/15/2017 8:40 AM  Physician: Dr. Dwyane Dee MD 05/15/2017 8:40 AM  Nursing: Theodis Shove RN 05/15/2017 8:40 AM  RN Care Manager: Lars Pinks CM 05/15/2017 8:40 AM  Social Worker: Maxie Better, LCSW 05/15/2017 8:40 AM  Recreational Therapist: x 05/15/2017 8:40 AM  Other: Lindell Spar NP 05/15/2017 8:40 AM  Other:  05/15/2017 8:40 AM  Other: 05/15/2017 8:40 AM    Scribe for Treatment Team: Kimber Relic Smart, LCSW 05/15/2017 8:40 AM

## 2017-05-15 NOTE — Progress Notes (Signed)
[facility] Group Notes:  (Nursing/MHT/Case Management/Adjunct)  Date:  05/15/2017  Time:  0930 Type of Therapy:  Nurse Education  Participation Level:  Active  Participation Quality:  Appropriate and Sharing  Affect:  Appropriate  Cognitive:  Alert and Oriented  Insight:  Appropriate  Engagement in Group:  Developing/Improving  Modes of Intervention:  Activity, Discussion, Socialization and Support  Summary of Progress/Problems:The purpose of this group is to introduce aromatherapy and its benefits. Pt participated in group and conversation.  Mosie Lukes 05/15/2017, 12:30 PM

## 2017-05-15 NOTE — Progress Notes (Addendum)
  [facility] Adult Case Management Discharge Plan :  Will you be returning to the same living situation after discharge:  Yes,  home with wife At discharge, do you have transportation home?: Yes,  wife Do you have the ability to pay for your medications: Yes,  [employer] insurance  Release of information consent forms completed and submitted to medical records by CSW.  Patient to Follow up at: Follow-up [place], [facility] Follow up on 05/26/2017.   Specialty:  Behavioral Health Why:  Hospital follow-up on 12:30PM on Monday, 05/26/17 with Melissa for counseling. Please arrive 15 minutes early and bring your insurance card to this appt. Thank you.  Contact information: [place] [address] [phone]           Next level of care provider has access to [place] and Suicide Prevention discussed: Yes,  SPE completed with pt's wife.  Have you used any form of tobacco in the last 30 days? (Cigarettes, Smokeless Tobacco, Cigars, and/or Pipes): No  Has patient been referred to the Quitline?: N/A patient is not a smoker  Patient has been referred for addiction treatment: N/A/Yes  [employer] LCSW 05/15/2017, 8:39 AM

## 2017-05-15 NOTE — Discharge Summary (Signed)
Physician Discharge Summary Note  Patient:  Marvin Espinoza is an 46 y.o., male MRN:  [id number] DOB:  November 09, 1971 Patient phone:  [phone] (home)  Patient address:   [address] [place] [address],  Total Time spent with patient: Greater than 30 minutes  Date of Admission:  05/10/2017  Date of Discharge: 05-15-17  Reason for Admission: Suspected suicide attempt by overdose.  Principal Problem: Severe major depression [facility])  Discharge Diagnoses: Patient Active Problem List   Diagnosis Date Noted  . Severe major depression ([place]) [F32.2] 05/10/2017  . Affective psychosis, bipolar ([place]) [F31.9] 05/10/2017  . Head injury [S09.90XA] 05/06/2017  . Dizziness [R42] 05/06/2017  . Vision disturbance [H53.9] 05/06/2017  . History of concussion [Z87.820] 02/10/2017  . Post concussion syndrome [F07.81] 02/10/2017  . Mood disorder ([place]) [F39] 02/10/2017  . 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 Adjustment disorder with anxious mood [F43.22] 01/24/2017  . GAD (generalized anxiety disorder) [F41.1] 01/24/2017  . Gastroesophageal reflux disease without esophagitis [K21.9] 11/19/2016  . Hypothyroidism [E03.9] 11/19/2016  . DDD (degenerative disc disease), lumbar [M51.36] 11/19/2016   Past Psychiatric History: Bipolar disorder, Head injury  Past Medical History:  Past Medical History:  Diagnosis Date  . Anxiety   . Bipolar disorder ([place])   . CTS (carpal tunnel syndrome)   . Depression   . GERD (gastroesophageal reflux disease)   . H/O bariatric surgery   . Hypothyroidism   . Hypothyroidism   . Sleep apnea    uses CPAP nightly  . Sleep apnea    wears CPAP nightly    Past Surgical History:  Procedure Laterality Date  . ANAL FISSURE REPAIR    . BARIATRIC SURGERY    . CARPAL TUNNEL RELEASE Left 05/09/2015   Procedure: LEFT CARPAL TUNNEL RELEASE;  Surgeon: Daryll Brod, MD;  Location: [place];  Service: Orthopedics;  Laterality: Left;  . CARPAL TUNNEL  RELEASE Left 05-09-2015  . CARPAL TUNNEL RELEASE Right 06/20/2015   Procedure: RIGHT CARPAL TUNNEL RELEASE;  Surgeon: Daryll Brod, MD;  Location: [place];  Service: Orthopedics;  Laterality: Right;  REGIONAL/FAB  . CHOLECYSTECTOMY     Family History:  Family History  Problem Relation Age of Onset  . COPD Mother   . Diabetes Mother   . Heart disease Mother   . Depression Mother   . COPD Father   . Heart disease Father   . Kidney disease Father   . Mental illness Father   . Depression Father   . Mental illness Sister   . Depression Sister    Family Psychiatric  History: See H&P  Social History:  History  Alcohol Use No     History  Drug Use No    Social History   Social History  . Marital status: Married    Spouse name: N/A  . Number of children: N/A  . Years of education: N/A   Social History Main Topics  . Smoking status: Never Smoker  . Smokeless tobacco: Never Used  . Alcohol use No  . Drug use: No  . Sexual activity: Yes   Other Topics Concern  . None   Social History Narrative   ** Merged History Encounter **       Hospital Course: (Per Admission notes):  Marvin Espinoza, 46 yo male, married, initially presented to [facility] after he overdosed on Xanax.  Patient has history of depression and anxiety disorder. Patient states that he had many stressors to include financial hardship, poor  to no sleep, unstable job due to new onset of dizziness.  He states that he is not sure how he'll be able to support family, he is behind payments for house and car. He states that he did not mean to overdose, he just "really just wanted to get some sleep."  He states that he has been ondifferen psych  medications, denies non compliance.  Denies SI HI paranoia or psychosis.  He is interacting appropriately win milieu and with staff members.    Marvin Espinoza was admitted to the hospital for worsening symptoms of depression/anxiety & crisis management due to suspected suicide  attempt by overdose on Xanax. However, he denied this being the case. He stated that he was trying to get some sleep. He did report numerous stressors in his life that includes, financial, insomnia & onset of dizziness that he feared may affect his job. He was behind on his mortgage payment & has hx of Bipolar disorder. He was admitted for mood stabilization treatments.  After admission assessment, Marvin Espinoza presenting symptoms were identified. The medication regimen targeting those symptoms were initiated. He was medicated & discharged on; Tegretol 200 mg for mood stability, Hydroxyzine 25 mg & 50 mg respectively for anxiety/insomnia, Lamictal 50 mg for mood stabilization & Zyprexa Zydis 5 mg for mood control. His other pre-existing medical problems were identified & monitored as needed. He was enrolled & participated in the group counseling sessions being offered & held on this unit. He learned coping skills that should help him to cope better to maintain mood stability as well.  During the course of his hospitalization, Marvin Espinoza improvement was monitored by observation & his daily report of symptom reduction noted. His emotional & mental status were monitored by daily self-inventory reports completed by him & the clinical staff. He was evaluated daily by the treatment team for mood stability and plans for continued recovery after discharge. Marvin Espinoza motivation was an integral factor in his mood stability. He was offered further treatment options upon discharge on an outpatient basis to continue mental health care after discharge. He was provided with all the necessary information needed to make this appointment without problems.   Upon discharge, Marvin Espinoza was both mentally and medically stable. He denies suicidal/homicidal ideations, auditory/visual/tactile hallucinations, delusional thoughts or paranoia. He left [facility] with all personal belongings in no distress. Transportation per wife.  \Physical  Findings: AIMS: Facial and Oral Movements Muscles of Facial Expression: None, normal Lips and Perioral Area: None, normal Jaw: None, normal Tongue: None, normal,Extremity Movements Upper (arms, wrists, hands, fingers): None, normal Lower (legs, knees, ankles, toes): None, normal, Trunk Movements Neck, shoulders, hips: None, normal, Overall Severity Severity of abnormal movements (highest score from questions above): None, normal Incapacitation due to abnormal movements: None, normal Patient's awareness of abnormal movements (rate only patient's report): No Awareness, Dental Status Current problems with teeth and/or dentures?: No Does patient usually wear dentures?: No  CIWA:  CIWA-Ar Total: 0 COWS:     Musculoskeletal: Strength & Muscle Tone: within normal limits Gait & Station: normal Patient leans: N/A  Psychiatric Specialty Exam: Physical Exam  Constitutional: He appears well-developed.  HENT:  Head: Normocephalic.  Eyes: Pupils are equal, round, and reactive to light.  Neck: Normal range of motion.  Cardiovascular: Normal rate.   Respiratory: Effort normal.  GI: Soft.  Genitourinary:  Genitourinary Comments: Deferred  Musculoskeletal: Normal range of motion.  Neurological: He is alert.  Skin: Skin is warm.    Review of Systems  Constitutional: Negative.  HENT: Negative.   Eyes: Negative.   Respiratory: Negative.   Cardiovascular: Negative.   Gastrointestinal: Negative.   Genitourinary: Negative.   Musculoskeletal: Negative.   Skin: Negative.   Neurological: Negative.   Endo/Heme/Allergies: Negative.   Psychiatric/Behavioral: Positive for depression (Stable). Negative for hallucinations, memory loss, substance abuse and suicidal ideas. The patient has insomnia (Stable). The patient is not nervous/anxious.     Blood pressure 115/72, pulse 86, temperature 97.8 F (36.6 C), temperature source Oral, resp. rate 18, height 5\' 9"  (1.753 m), weight 84.4 kg (186 lb),  SpO2 100 %.Body mass index is 27.47 kg/m.  See Md's SRA   Have you used any form of tobacco in the last 30 days? (Cigarettes, Smokeless Tobacco, Cigars, and/or Pipes): No  Has this patient used any form of tobacco in the last 30 days? (Cigarettes, Smokeless Tobacco, Cigars, and/or Pipes): No  Blood Alcohol level:  Lab Results  Component Value Date   ETH <5 17/51/0258   Metabolic Disorder Labs:  No results found for: HGBA1C, MPG No results found for: PROLACTIN Lab Results  Component Value Date   CHOL 111 04/25/2017   TRIG 39 04/25/2017   HDL 47 04/25/2017   CHOLHDL 2.4 04/25/2017   LDLCALC 56 04/25/2017   See Psychiatric Specialty Exam and Suicide Risk Assessment completed by Attending Physician prior to discharge.  Discharge destination:  Home  Is patient on multiple antipsychotic therapies at discharge:  No   Has Patient had three or more failed trials of antipsychotic monotherapy by history:  No  Recommended Plan for Multiple Antipsychotic Therapies: NA  Allergies as of 05/15/2017      Reactions   Abilify [aripiprazole] Anaphylaxis   Alcohol Itching   Drinking alcohol   Hydrocodone-acetaminophen Itching   Victoza [liraglutide] Other (See Comments)   Severe heartburn   Victoza [liraglutide]    Severe heartburn      Medication List    STOP taking these medications   ALPRAZolam 1 MG tablet Commonly known as:  XANAX   Colchicine 0.6 MG Caps   meclizine 25 MG tablet Commonly known as:  ANTIVERT   promethazine 25 MG tablet Commonly known as:  PHENERGAN   traMADol 50 MG tablet Commonly known as:  ULTRAM   VITAMIN B-12 PO   VRAYLAR 1.5 MG Caps Generic drug:  Cariprazine HCl     TAKE these medications     Indication  calcium-vitamin D 500-200 MG-UNIT tablet Commonly known as:  OSCAL WITH D Take 1 tablet by mouth 2 (two) times daily. For bone health What changed:  additional instructions  Indication:  Low Amount of Calcium in the Blood    carbamazepine 200 MG tablet Commonly known as:  TEGRETOL Take 1 tablet (200 mg total) by mouth 3 (three) times daily. For mood stabilization  Indication:  Mood stabilization   cyclobenzaprine 10 MG tablet Commonly known as:  FLEXERIL Take 1 tablet (10 mg total) by mouth 3 (three) times daily as needed for muscle spasms.  Indication:  Muscle Spasm   hydrOXYzine 25 MG tablet Commonly known as:  ATARAX/VISTARIL Take 1 tablet (25 mg three times daily as needed & 2 tablets (50 mg) at bedtime: Foe anxiety/sleep  Indication:  Anxiety Neurosis, Sedation, Tension   lamoTRIgine 25 MG tablet Commonly known as:  LAMICTAL Take 2 tablets (50 mg total) by mouth 2 (two) times daily. For mood stabilization What changed:  See the new instructions.  Indication:  Mood stabilization   levothyroxine 100 MCG tablet Commonly known  as:  SYNTHROID, LEVOTHROID TAKE ONE (1) TABLET EACH DAY: For low functioning thyroid What changed:  See the new instructions.  Indication:  Underactive Thyroid   Methylcobalamin 5000 MCG Tbdp Take 5,000 mcg by mouth daily. Start taking on:  05/16/2017  Indication:  Vitamin supplement   multivitamin with minerals Tabs tablet Take 1 tablet by mouth 2 (two) times daily. For Vitamin supplementation What changed:  additional instructions  Indication:  Vitamin supplement   OLANZapine zydis 5 MG disintegrating tablet Commonly known as:  ZYPREXA Take 1 tablet (5 mg total) by mouth at bedtime. For mood control  Indication:  Mood control   omeprazole 40 MG capsule Commonly known as:  PRILOSEC TAKE ONE (1) CAPSULE EACH DAY: For acid reflux What changed:  See the new instructions.  Indication:  Gastroesophageal Reflux Disease      Follow-up Information    [facility] Follow up on 05/26/2017.   Specialty:  Behavioral Health Why:  Hospital follow-up on 12:30PM on Monday, 05/26/17 with Melissa for counseling. Please arrive 15 minutes early and bring your insurance card  to this appt. Thank you.  Contact information: [address] [place] [address] [phone]          Follow-up recommendations: Activity:  As tolerated Diet: As recommended by your primary care doctor. Keep all scheduled follow-up appointments as recommended.   Comments: Patient is instructed prior to discharge to: Take all medications as prescribed by his/her mental healthcare provider. Report any adverse effects and or reactions from the medicines to his/her outpatient provider promptly. Patient has been instructed & cautioned: To not engage in alcohol and or illegal drug use while on prescription medicines. In the event of worsening symptoms, patient is instructed to call the crisis hotline, 911 and or go to the nearest ED for appropriate evaluation and treatment of symptoms. To follow-up with his/her primary care provider for your other medical issues, concerns and or health care needs.   Signed: Encarnacion Slates, NP, PMHNP, FNP-BC 05/15/2017, 10:59 AM

## 2017-05-15 NOTE — BHH Suicide Risk Assessment (Signed)
[facility] Discharge Suicide Risk Assessment   Principal Problem: Severe major depression ([place])  Patient is a 46 year old white male, married, lives with his family. Patient has a previous history of mood disorder, PTSD and anxiety disorder. Patient was admitted for overdose on 10 tablets of alprazolam. Patient reported that he was going through a lot of financial difficulty, was struggling in regards to his work. He added that he had a long history of depression along with anxiety, did well on Celexa for years, was switched when he stopped responding to it and has had struggles with anxiety on other medication trials. Patient also reported that he had difficulty in regards to his sleep.  This morning patient reports that he is doing fairly well in regards to his mood, adds that he is sleeping well, reports that the medications are working. On a scale of 0-10 with 0 being no symptoms in 10 being the worst, patient reports his depression is a 3 out of 10. He has that he does not have any thoughts of hurting himself or others. He also denies any psychotic symptoms. He states that he is motivated to follow-up outpatient, and take his medications as prescribed. He denies any concerns this morning. Discharge Diagnoses:  Patient Active Problem List   Diagnosis Date Noted  . Severe major depression ([place]) [F32.2] 05/10/2017  . Affective psychosis, bipolar ([place]) [F31.9] 05/10/2017  . Head injury [S09.90XA] 05/06/2017  . Dizziness [R42] 05/06/2017  . Vision disturbance [H53.9] 05/06/2017  . History of concussion [Z87.820] 02/10/2017  . Post concussion syndrome [F07.81] 02/10/2017  . Mood disorder ([place]) [F39] 02/10/2017  . 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 Adjustment disorder with anxious mood [F43.22] 01/24/2017  . GAD (generalized anxiety disorder) [F41.1] 01/24/2017  . Gastroesophageal reflux disease without esophagitis [K21.9] 11/19/2016  . Hypothyroidism [E03.9] 11/19/2016  . DDD (degenerative  disc disease), lumbar [M51.36] 11/19/2016    Total Time spent with patient: 30 minutes  Musculoskeletal: Strength & Muscle Tone: within normal limits Gait & Station: normal Patient leans: N/A  Psychiatric Specialty Exam: Review of Systems  Constitutional: Negative.  Negative for fever, malaise/fatigue and weight loss.  HENT: Negative.  Negative for congestion and sore throat.   Eyes: Negative.  Negative for blurred vision and double vision.  Respiratory: Negative.  Negative for cough, shortness of breath and wheezing.   Cardiovascular: Negative.  Negative for chest pain and palpitations.  Gastrointestinal: Negative.  Negative for abdominal pain, constipation, diarrhea, heartburn, nausea and vomiting.  Musculoskeletal: Negative.  Negative for falls and myalgias.  Skin: Negative.  Negative for rash.  Neurological: Negative.  Negative for dizziness, seizures, loss of consciousness and headaches.  Endo/Heme/Allergies: Negative.  Negative for environmental allergies.  Psychiatric/Behavioral: Negative.  Negative for depression, hallucinations, memory loss, substance abuse and suicidal ideas. The patient is not nervous/anxious and does not have insomnia.     Blood pressure 115/72, pulse 86, temperature 97.8 F (36.6 C), temperature source Oral, resp. rate 18, height 5\' 9"  (1.753 m), weight 84.4 kg (186 lb), SpO2 100 %.Body mass index is 27.47 kg/m.  General Appearance: Casual  Eye Contact::  Fair  Speech:  Clear and Coherent and Normal Rate409  Volume:  Normal  Mood:  Euthymic  Affect:  Congruent and Full Range  Thought Process:  Coherent, Goal Directed and Descriptions of Associations: Intact  Orientation:  Full (Time, Place, and Person)  Thought Content:  WDL  Suicidal Thoughts:  No  Homicidal Thoughts:  No  Memory:  Immediate;   Fair  Recent;   Fair Remote;   Fair  Judgement:  Intact  Insight:  Present  Psychomotor Activity:  Normal  Concentration:  Fair  Recall:  [place]  Language: Fair  Akathisia:  No  Handed:  Right  AIMS (if indicated):     Assets:  Communication Skills Desire for Improvement Housing Physical Health Social Support Transportation  Sleep:  Number of Hours: 6  Cognition: WNL  ADL's:  Intact   Mental Status Per Nursing Assessment::   On Admission:     Demographic Factors:  Male and Caucasian  Loss Factors: NA  Historical Factors: Impulsivity  Risk Reduction Factors:   Sense of responsibility to family, Living with another person, especially a relative, Positive social support and Positive therapeutic relationship  Continued Clinical Symptoms:  More than one psychiatric diagnosis Previous Psychiatric Diagnoses and Treatments  Cognitive Features That Contribute To Risk:  None    Suicide Risk:  Minimal: No identifiable suicidal ideation.  Patients presenting with no risk factors but with morbid ruminations; may be classified as minimal risk based on the severity of the depressive symptoms  [place] Follow up on 05/26/2017.   Specialty:  Behavioral Health Why:  Hospital follow-up on 12:30PM on Monday, 05/26/17 with Melissa for counseling. Please arrive 15 minutes early and bring your insurance card to this appt. Thank you.  Contact information: [address] [place] [address] [phone]           Plan Of Care/Follow-up recommendations:  Activity:  As tolerated Diet:  Regular Other:  Keep follow-up appointments and take medications as prescribed  Hampton Abbot, MD 05/15/2017, 11:22 AM

## 2017-05-23 DIAGNOSIS — E119 Type 2 diabetes mellitus without complications: Secondary | ICD-10-CM | POA: Diagnosis not present

## 2017-05-23 DIAGNOSIS — K219 Gastro-esophageal reflux disease without esophagitis: Secondary | ICD-10-CM | POA: Diagnosis not present

## 2017-05-23 DIAGNOSIS — E7800 Pure hypercholesterolemia, unspecified: Secondary | ICD-10-CM | POA: Diagnosis not present

## 2017-05-23 DIAGNOSIS — M1990 Unspecified osteoarthritis, unspecified site: Secondary | ICD-10-CM | POA: Diagnosis not present

## 2017-05-23 DIAGNOSIS — F419 Anxiety disorder, unspecified: Secondary | ICD-10-CM | POA: Diagnosis not present

## 2017-05-23 DIAGNOSIS — R51 Headache: Secondary | ICD-10-CM | POA: Diagnosis not present

## 2017-05-23 DIAGNOSIS — F329 Major depressive disorder, single episode, unspecified: Secondary | ICD-10-CM | POA: Diagnosis not present

## 2017-05-23 DIAGNOSIS — E039 Hypothyroidism, unspecified: Secondary | ICD-10-CM | POA: Diagnosis not present

## 2017-05-23 DIAGNOSIS — Z79899 Other long term (current) drug therapy: Secondary | ICD-10-CM | POA: Diagnosis not present

## 2017-05-23 DIAGNOSIS — T50905A Adverse effect of unspecified drugs, medicaments and biological substances, initial encounter: Secondary | ICD-10-CM | POA: Diagnosis not present

## 2017-05-26 DIAGNOSIS — F331 Major depressive disorder, recurrent, moderate: Secondary | ICD-10-CM | POA: Diagnosis not present

## 2017-05-26 DIAGNOSIS — F4310 Post-traumatic stress disorder, unspecified: Secondary | ICD-10-CM | POA: Diagnosis not present

## 2017-05-28 ENCOUNTER — Telehealth: Payer: Self-pay | Admitting: Physician Assistant

## 2017-05-28 NOTE — Telephone Encounter (Signed)
Please advise 

## 2017-05-29 NOTE — Telephone Encounter (Signed)
Spoke with wife about med adjustment

## 2017-06-03 DIAGNOSIS — F331 Major depressive disorder, recurrent, moderate: Secondary | ICD-10-CM | POA: Diagnosis not present

## 2017-06-03 DIAGNOSIS — F4310 Post-traumatic stress disorder, unspecified: Secondary | ICD-10-CM | POA: Diagnosis not present

## 2017-06-04 ENCOUNTER — Other Ambulatory Visit: Payer: Self-pay | Admitting: Physician Assistant

## 2017-06-06 DIAGNOSIS — F331 Major depressive disorder, recurrent, moderate: Secondary | ICD-10-CM | POA: Diagnosis not present

## 2017-06-06 DIAGNOSIS — F4310 Post-traumatic stress disorder, unspecified: Secondary | ICD-10-CM | POA: Diagnosis not present

## 2017-06-13 DIAGNOSIS — F331 Major depressive disorder, recurrent, moderate: Secondary | ICD-10-CM | POA: Diagnosis not present

## 2017-06-13 DIAGNOSIS — F4310 Post-traumatic stress disorder, unspecified: Secondary | ICD-10-CM | POA: Diagnosis not present

## 2017-06-17 ENCOUNTER — Ambulatory Visit (INDEPENDENT_AMBULATORY_CARE_PROVIDER_SITE_OTHER): Payer: BLUE CROSS/BLUE SHIELD | Admitting: Psychology

## 2017-06-17 DIAGNOSIS — F411 Generalized anxiety disorder: Secondary | ICD-10-CM

## 2017-06-17 DIAGNOSIS — F0781 Postconcussional syndrome: Secondary | ICD-10-CM | POA: Diagnosis not present

## 2017-06-17 DIAGNOSIS — F4310 Post-traumatic stress disorder, unspecified: Secondary | ICD-10-CM
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
## 2017-06-18 ENCOUNTER — Encounter: Payer: Self-pay | Admitting: Psychology

## 2017-06-25 DIAGNOSIS — F4310 Post-traumatic stress disorder, unspecified: Secondary | ICD-10-CM | POA: Diagnosis not present

## 2017-06-25 DIAGNOSIS — F331 Major depressive disorder, recurrent, moderate: Secondary | ICD-10-CM | POA: Diagnosis not present

## 2017-07-02 ENCOUNTER — Ambulatory Visit (INDEPENDENT_AMBULATORY_CARE_PROVIDER_SITE_OTHER): Payer: BLUE CROSS/BLUE SHIELD | Admitting: Family Medicine

## 2017-07-02 ENCOUNTER — Ambulatory Visit: Payer: BLUE CROSS/BLUE SHIELD | Admitting: Physician Assistant

## 2017-07-02 ENCOUNTER — Encounter: Payer: Self-pay | Admitting: Family Medicine

## 2017-07-02 ENCOUNTER — Other Ambulatory Visit: Payer: Self-pay | Admitting: Physician Assistant

## 2017-07-02 VITALS — BP 116/77 | HR 64 | Temp 97.3°F | Ht 70.0 in | Wt 204.6 lb

## 2017-07-02 DIAGNOSIS — R591 Generalized enlarged lymph nodes: Secondary | ICD-10-CM

## 2017-07-02 NOTE — Progress Notes (Signed)
   Subjective:    Patient ID: Marvin Espinoza, male    DOB: [birth year], 46 y.o.   MRN: [id number]  HPI  Patient Active Problem List   Diagnosis Date Noted  . Severe major depression ([place]) 05/10/2017  . Affective psychosis, bipolar ([place]) 05/10/2017  . Head injury 05/06/2017  . Dizziness 05/06/2017  . Vision disturbance 05/06/2017  . History of concussion 02/10/2017  . Post concussion syndrome 02/10/2017  . Mood disorder ([place]) 02/10/2017  . Adjustment insomnia 01/24/2017  . Adjustment disorder with anxious mood 01/24/2017  . GAD (generalized anxiety disorder) 01/24/2017  . Gastroesophageal reflux disease without esophagitis 11/19/2016  . Hypothyroidism 11/19/2016  . DDD (degenerative disc disease), lumbar 11/19/2016   Outpatient Encounter Prescriptions as of 07/02/2017  Medication Sig  . ALPRAZolam (XANAX) 1 MG tablet Take 1 mg by mouth as needed.  . benztropine (COGENTIN) 1 MG tablet Take 1 tablet by mouth 2 (two) times daily.  . calcium-vitamin D (OSCAL WITH D) 500-200 MG-UNIT tablet Take 1 tablet by mouth 2 (two) times daily. For bone health  . cyclobenzaprine (FLEXERIL) 10 MG tablet Take 1 tablet (10 mg total) by mouth 3 (three) times daily as needed for muscle spasms.  . Deutetrabenazine (AUSTEDO) 9 MG TABS Take by mouth.  . hydrOXYzine (ATARAX/VISTARIL) 25 MG tablet Take 1 tablet (25 mg three times daily as needed & 2 tablets (50 mg) at bedtime: Foe anxiety/sleep  . lamoTRIgine (LAMICTAL) 25 MG tablet Take 2 tablets (50 mg total) by mouth 2 (two) times daily. For mood stabilization  . levothyroxine (SYNTHROID, LEVOTHROID) 100 MCG tablet TAKE ONE (1) TABLET EACH DAY: For low functioning thyroid  . Multiple Vitamin (MULTIVITAMIN WITH MINERALS) TABS tablet Take 1 tablet by mouth 2 (two) times daily. For Vitamin supplementation  . omeprazole (PRILOSEC) 40 MG capsule TAKE ONE (1) CAPSULE EACH DAY  . prazosin (MINIPRESS) 2 MG capsule Take 2 mg by mouth once.  Marland Kitchen VIIBRYD 40 MG TABS Take  20 mg by mouth daily.  . [DISCONTINUED] carbamazepine (TEGRETOL) 200 MG tablet Take 1 tablet (200 mg total) by mouth 3 (three) times daily. For mood stabilization (Patient not taking: Reported on 06/17/2017)  . [DISCONTINUED] Methylcobalamin 5000 MCG TBDP Take 5,000 mcg by mouth daily.  . [DISCONTINUED] OLANZapine zydis (ZYPREXA) 5 MG disintegrating tablet Take 1 tablet (5 mg total) by mouth at bedtime. For mood control (Patient not taking: Reported on 06/17/2017)  . [DISCONTINUED] omeprazole (PRILOSEC) 40 MG capsule TAKE ONE (1) CAPSULE EACH DAY: For acid reflux  . [DISCONTINUED] VRAYLAR 3 MG CAPS Take 3 mg by mouth daily.   No facility-administered encounter medications on file as of 07/02/2017.       Review of Systems     Objective:   Physical Exam BP 116/77   Pulse 64   Temp (!) 97.3 F (36.3 C) (Oral)   Ht 5\' 10"  (1.778 m)   Wt 204 lb 9.6 oz (92.8 kg)   BMI 29.36 kg/m         Assessment & Plan:

## 2017-07-02 NOTE — Progress Notes (Signed)
S: 46 year old gentleman with some lumps on posterior neck. He is concerned because his mother and uncle have had throat cancer, but were also smokers. First I reassured him that her cancers are not necessarily familial and probably were related to their smoking. He has had no obvious infection in the head or neck areas. Lumps are not sore and were discovered accidentally.  O: Patient is nontoxic appearing. There are 2 small suboccipital nodes which are rubbery and mobile one on either side of his neck. They are nontender and there is no associated inflammation. Otherwise ears and throat are unremarkable. There are some nonspecific submandibular nodes as well.  A,P: Reactive lymphadenopathy. Tried to reassure patient but also recommended that if there were change let us know. Tried to resist feeling the areas multiple times a day is this might induce soreness.

## 2017-07-08 DIAGNOSIS — F331 Major depressive disorder, recurrent, moderate: Secondary | ICD-10-CM | POA: Diagnosis not present

## 2017-07-08 DIAGNOSIS — F4310 Post-traumatic stress disorder, unspecified: Secondary | ICD-10-CM | POA: Diagnosis not present

## 2017-07-09 DIAGNOSIS — F4310 Post-traumatic stress disorder, unspecified: Secondary | ICD-10-CM | POA: Diagnosis not present

## 2017-07-09 DIAGNOSIS — F331 Major depressive disorder, recurrent, moderate: Secondary | ICD-10-CM | POA: Diagnosis not present

## 2017-07-16 ENCOUNTER — Ambulatory Visit (INDEPENDENT_AMBULATORY_CARE_PROVIDER_SITE_OTHER): Payer: BLUE CROSS/BLUE SHIELD | Admitting: Psychology

## 2017-07-16 DIAGNOSIS — S060X0S Concussion without loss of consciousness, sequela: Secondary | ICD-10-CM

## 2017-07-16 DIAGNOSIS — F0781 Postconcussional syndrome: Secondary | ICD-10-CM

## 2017-07-16 NOTE — Progress Notes (Signed)
   Neuropsychology Note  [name] returned today for 2 hours of neuropsychological testing with technician, Milana Kidney, BS, under the supervision of Dr. Macarthur Critchley. The patient did appear anxious by the testing session, per behavioral observation or via self-report to the technician. Rest breaks were offered. Marvin Espinoza will return within 2 weeks for a feedback session with Dr. Si Raider at which time his test performances, clinical impressions and treatment recommendations will be reviewed in detail. The patient understands he can contact our office should he require our assistance before this time.  Full report to follow.

## 2017-07-18 ENCOUNTER — Ambulatory Visit: Payer: Self-pay | Admitting: Neurology
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
## 2017-07-24 ENCOUNTER — Encounter: Payer: Self-pay | Admitting: Psychology

## 2017-07-24 ENCOUNTER — Ambulatory Visit (INDEPENDENT_AMBULATORY_CARE_PROVIDER_SITE_OTHER): Payer: BLUE CROSS/BLUE SHIELD | Admitting: Psychology

## 2017-07-24 DIAGNOSIS — Z6827 Body mass index (BMI) 27.0-27.9, adult: Secondary | ICD-10-CM | POA: Diagnosis not present

## 2017-07-24 DIAGNOSIS — D649 Anemia, unspecified: Secondary | ICD-10-CM | POA: Diagnosis not present

## 2017-07-24 DIAGNOSIS — R7301 Impaired fasting glucose: Secondary | ICD-10-CM | POA: Diagnosis not present

## 2017-07-24 DIAGNOSIS — Z9884 Bariatric surgery status: Secondary | ICD-10-CM | POA: Diagnosis not present

## 2017-07-24 DIAGNOSIS — S060X0S Concussion without loss of consciousness, sequela: Secondary | ICD-10-CM

## 2017-07-24 DIAGNOSIS — E663 Overweight: Secondary | ICD-10-CM | POA: Diagnosis not present

## 2017-07-24 DIAGNOSIS — E569 Vitamin deficiency, unspecified: Secondary | ICD-10-CM | POA: Diagnosis not present

## 2017-07-25 DIAGNOSIS — F331 Major depressive disorder, recurrent, moderate: Secondary | ICD-10-CM | POA: Diagnosis not present

## 2017-07-25 DIAGNOSIS — F4310 Post-traumatic stress disorder, unspecified: Secondary | ICD-10-CM | POA: Diagnosis not present

## 2017-08-01 ENCOUNTER — Ambulatory Visit: Payer: Self-pay | Admitting: Neurology

## 2017-08-06 ENCOUNTER — Ambulatory Visit (INDEPENDENT_AMBULATORY_CARE_PROVIDER_SITE_OTHER): Payer: BLUE CROSS/BLUE SHIELD | Admitting: Family Medicine

## 2017-08-06 ENCOUNTER — Encounter: Payer: Self-pay | Admitting: Family Medicine

## 2017-08-06 VITALS — BP 97/54 | HR 72 | Temp 98.0°F | Ht 70.0 in | Wt 200.0 lb

## 2017-08-06 DIAGNOSIS — F411 Generalized anxiety disorder: Secondary | ICD-10-CM | POA: Diagnosis not present

## 2017-08-06 DIAGNOSIS — M549 Dorsalgia, unspecified: Secondary | ICD-10-CM | POA: Diagnosis not present

## 2017-08-06 DIAGNOSIS — F322 Major depressive disorder, single episode, severe without psychotic features: Secondary | ICD-10-CM

## 2017-08-06 DIAGNOSIS — R079 Chest pain, unspecified: Secondary | ICD-10-CM | POA: Diagnosis not present

## 2017-08-06 DIAGNOSIS — F331 Major depressive disorder, recurrent, moderate: Secondary | ICD-10-CM | POA: Diagnosis not present

## 2017-08-06 DIAGNOSIS — R0602 Shortness of breath: Secondary | ICD-10-CM | POA: Diagnosis not present

## 2017-08-06 DIAGNOSIS — F329 Major depressive disorder, single episode, unspecified: Secondary | ICD-10-CM | POA: Diagnosis not present

## 2017-08-06 DIAGNOSIS — F419 Anxiety disorder, unspecified: Secondary | ICD-10-CM | POA: Diagnosis not present

## 2017-08-06 DIAGNOSIS — R400 Somnolence: Secondary | ICD-10-CM | POA: Diagnosis not present

## 2017-08-06 DIAGNOSIS — F4310 Post-traumatic stress disorder, unspecified: Secondary | ICD-10-CM | POA: Diagnosis not present
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
## 2017-08-07 LAB — D-DIMER, QUANTITATIVE: D-DIMER: 0.3 mg/L FEU (ref 0.00–0.49)

## 2017-08-08 ENCOUNTER — Other Ambulatory Visit: Payer: Self-pay | Admitting: Physician Assistant

## 2017-08-08 DIAGNOSIS — F4310 Post-traumatic stress disorder, unspecified: Secondary | ICD-10-CM | POA: Diagnosis not present

## 2017-08-08 DIAGNOSIS — F419 Anxiety disorder, unspecified: Secondary | ICD-10-CM | POA: Diagnosis not present

## 2017-08-08 DIAGNOSIS — F410 Panic disorder [episodic paroxysmal anxiety] without agoraphobia: Secondary | ICD-10-CM | POA: Diagnosis not present

## 2017-08-08 DIAGNOSIS — F411 Generalized anxiety disorder: Secondary | ICD-10-CM | POA: Diagnosis not present

## 2017-08-08 DIAGNOSIS — R45851 Suicidal ideations: Secondary | ICD-10-CM | POA: Diagnosis not present

## 2017-08-08 DIAGNOSIS — F315 Bipolar disorder, current episode depressed, severe, with psychotic features: Secondary | ICD-10-CM | POA: Diagnosis not present

## 2017-08-09 DIAGNOSIS — F410 Panic disorder [episodic paroxysmal anxiety] without agoraphobia: Secondary | ICD-10-CM | POA: Diagnosis not present

## 2017-08-09 DIAGNOSIS — F329 Major depressive disorder, single episode, unspecified: Secondary | ICD-10-CM | POA: Diagnosis not present

## 2017-08-09 DIAGNOSIS — F411 Generalized anxiety disorder: Secondary | ICD-10-CM | POA: Diagnosis not present

## 2017-08-09 DIAGNOSIS — F315 Bipolar disorder, current episode depressed, severe, with psychotic features: Secondary | ICD-10-CM | POA: Diagnosis not present

## 2017-08-09 DIAGNOSIS — R45851 Suicidal ideations: Secondary | ICD-10-CM | POA: Diagnosis not present

## 2017-08-09 DIAGNOSIS — F419 Anxiety disorder, unspecified: Secondary | ICD-10-CM | POA: Diagnosis not present

## 2017-08-09 DIAGNOSIS — Z91048 Other nonmedicinal substance allergy status: Secondary | ICD-10-CM | POA: Diagnosis not present

## 2017-08-09 DIAGNOSIS — Z888 Allergy status to other drugs, medicaments and biological substances status: Secondary | ICD-10-CM | POA: Diagnosis not present

## 2017-08-09 DIAGNOSIS — F4310 Post-traumatic stress disorder, unspecified: Secondary | ICD-10-CM | POA: Diagnosis not present

## 2017-08-10 DIAGNOSIS — F315 Bipolar disorder, current episode depressed, severe, with psychotic features: Secondary | ICD-10-CM | POA: Diagnosis not present

## 2017-08-11 DIAGNOSIS — F315 Bipolar disorder, current episode depressed, severe, with psychotic features: Secondary | ICD-10-CM | POA: Diagnosis not present

## 2017-08-12 DIAGNOSIS — F411 Generalized anxiety disorder: Secondary | ICD-10-CM | POA: Diagnosis not present

## 2017-08-12 DIAGNOSIS — F410 Panic disorder [episodic paroxysmal anxiety] without agoraphobia: Secondary | ICD-10-CM | POA: Diagnosis not present

## 2017-08-12 DIAGNOSIS — F4310 Post-traumatic stress disorder, unspecified: Secondary | ICD-10-CM | POA: Diagnosis not present

## 2017-08-12 DIAGNOSIS — F315 Bipolar disorder, current episode depressed, severe, with psychotic features: Secondary | ICD-10-CM | POA: Diagnosis not present

## 2017-08-13 DIAGNOSIS — F410 Panic disorder [episodic paroxysmal anxiety] without agoraphobia: Secondary | ICD-10-CM | POA: Diagnosis not present

## 2017-08-13 DIAGNOSIS — F315 Bipolar disorder, current episode depressed, severe, with psychotic features: Secondary | ICD-10-CM | POA: Diagnosis not present

## 2017-08-13 DIAGNOSIS — F4310 Post-traumatic stress disorder, unspecified: Secondary | ICD-10-CM | POA: Diagnosis not present

## 2017-08-13 DIAGNOSIS — F411 Generalized anxiety disorder: Secondary | ICD-10-CM | POA: Diagnosis not present

## 2017-08-14 DIAGNOSIS — F4310 Post-traumatic stress disorder, unspecified: Secondary | ICD-10-CM | POA: Diagnosis not present

## 2017-08-14 DIAGNOSIS — F410 Panic disorder [episodic paroxysmal anxiety] without agoraphobia: Secondary | ICD-10-CM | POA: Diagnosis not present

## 2017-08-14 DIAGNOSIS — F315 Bipolar disorder, current episode depressed, severe, with psychotic features: Secondary | ICD-10-CM | POA: Diagnosis not present

## 2017-08-14 DIAGNOSIS — F411 Generalized anxiety disorder: Secondary | ICD-10-CM | POA: Diagnosis not present

## 2017-08-15 ENCOUNTER — Other Ambulatory Visit: Payer: Self-pay

## 2017-08-15 DIAGNOSIS — F315 Bipolar disorder, current episode depressed, severe, with psychotic features: Secondary | ICD-10-CM | POA: Diagnosis not present

## 2017-08-15 DIAGNOSIS — F411 Generalized anxiety disorder: Secondary | ICD-10-CM | POA: Diagnosis not present

## 2017-08-15 DIAGNOSIS — F410 Panic disorder [episodic paroxysmal anxiety] without agoraphobia: Secondary | ICD-10-CM | POA: Diagnosis not present

## 2017-08-15 DIAGNOSIS — R079 Chest pain, unspecified: Secondary | ICD-10-CM

## 2017-08-15 DIAGNOSIS — F4310 Post-traumatic stress disorder, unspecified: Secondary | ICD-10-CM | POA: Diagnosis not present

## 2017-08-18 DIAGNOSIS — F331 Major depressive disorder, recurrent, moderate: Secondary | ICD-10-CM | POA: Diagnosis not present

## 2017-08-18 DIAGNOSIS — F4310 Post-traumatic stress disorder, unspecified: Secondary | ICD-10-CM | POA: Diagnosis not present

## 2017-08-19 DIAGNOSIS — F4310 Post-traumatic stress disorder, unspecified: Secondary | ICD-10-CM | POA: Diagnosis not present

## 2017-08-19 DIAGNOSIS — F331 Major depressive disorder, recurrent, moderate: Secondary | ICD-10-CM | POA: Diagnosis not present

## 2017-08-20 ENCOUNTER — Ambulatory Visit (INDEPENDENT_AMBULATORY_CARE_PROVIDER_SITE_OTHER): Payer: BLUE CROSS/BLUE SHIELD | Admitting: Physician Assistant

## 2017-08-20 ENCOUNTER — Encounter: Payer: Self-pay | Admitting: Physician Assistant

## 2017-08-20 VITALS — BP 108/73 | HR 69 | Temp 97.7°F | Ht 70.0 in | Wt 197.2 lb

## 2017-08-20 DIAGNOSIS — M51369 Other intervertebral disc degeneration, lumbar region without mention of lumbar back pain or lower extremity pain: Secondary | ICD-10-CM

## 2017-08-20 DIAGNOSIS — K219 Gastro-esophageal reflux disease without esophagitis: Secondary | ICD-10-CM | POA: Diagnosis not present

## 2017-08-20 DIAGNOSIS — F4310 Post-traumatic stress disorder, unspecified: Secondary | ICD-10-CM | POA: Diagnosis not present

## 2017-08-20 DIAGNOSIS — F39 Unspecified mood [affective] disorder: Secondary | ICD-10-CM

## 2017-08-20 DIAGNOSIS — E039 Hypothyroidism, unspecified: Secondary | ICD-10-CM | POA: Diagnosis not present

## 2017-08-20 DIAGNOSIS — M5136 Other intervertebral disc degeneration, lumbar region: Secondary | ICD-10-CM | POA: Diagnosis not present

## 2017-08-20 DIAGNOSIS — G2401 Drug induced subacute dyskinesia: Secondary | ICD-10-CM

## 2017-08-20 MED ORDER — OMEPRAZOLE 40 MG PO CPDR: DELAYED_RELEASE_CAPSULE | ORAL | 3 refills | Status: DC

## 2017-08-20 MED ORDER — LEVOTHYROXINE SODIUM 100 MCG PO TABS: ORAL_TABLET | ORAL | 5 refills | Status: DC

## 2017-08-20 NOTE — Patient Instructions (Signed)
In a few days you may receive a survey in the mail or online from [employer] regarding your visit with us today. Please take a moment to fill this out. Your feedback is very important to our whole office. It can help us better understand your needs as well as improve your experience and satisfaction. Thank you for taking your time to complete it. We care about you.  Maragret Vanacker, PA-C  
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
## 2017-09-02 ENCOUNTER — Ambulatory Visit (INDEPENDENT_AMBULATORY_CARE_PROVIDER_SITE_OTHER): Payer: BLUE CROSS/BLUE SHIELD | Admitting: Neurology

## 2017-09-02 ENCOUNTER — Encounter: Payer: Self-pay | Admitting: Cardiovascular Disease

## 2017-09-02 ENCOUNTER — Encounter: Payer: Self-pay | Admitting: Neurology

## 2017-09-02 VITALS — BP 102/66 | HR 60 | Ht 70.0 in | Wt 201.8 lb

## 2017-09-02 DIAGNOSIS — F4310 Post-traumatic stress disorder, unspecified: Secondary | ICD-10-CM | POA: Diagnosis not present

## 2017-09-02 DIAGNOSIS — H8110 Benign paroxysmal vertigo, unspecified ear: Secondary | ICD-10-CM

## 2017-09-02 DIAGNOSIS — F331 Major depressive disorder, recurrent, moderate: Secondary | ICD-10-CM | POA: Diagnosis not present

## 2017-09-02 NOTE — Addendum Note (Signed)
Addended by: Chester Holstein on: 09/02/2017 01:52 PM   Modules accepted: Orders
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
## 2017-09-02 NOTE — Patient Instructions (Signed)
You no longer have concussion For dizziness, I will send you to physical therapy for vestibular rehabilitation

## 2017-09-11 DIAGNOSIS — R030 Elevated blood-pressure reading, without diagnosis of hypertension: Secondary | ICD-10-CM | POA: Diagnosis not present

## 2017-09-11 DIAGNOSIS — M5126 Other intervertebral disc displacement, lumbar region: Secondary | ICD-10-CM | POA: Diagnosis not present

## 2017-09-11 DIAGNOSIS — Z6827 Body mass index (BMI) 27.0-27.9, adult: Secondary | ICD-10-CM | POA: Diagnosis not present

## 2017-09-23 DIAGNOSIS — M545 Low back pain: Secondary | ICD-10-CM | POA: Diagnosis not present

## 2017-09-23 DIAGNOSIS — M5126 Other intervertebral disc displacement, lumbar region: Secondary | ICD-10-CM | POA: Diagnosis not present

## 2017-10-01 DIAGNOSIS — F4310 Post-traumatic stress disorder, unspecified: Secondary | ICD-10-CM | POA: Diagnosis not present

## 2017-10-01 DIAGNOSIS — F331 Major depressive disorder, recurrent, moderate: Secondary | ICD-10-CM | POA: Diagnosis not present

## 2017-10-03 ENCOUNTER — Ambulatory Visit: Payer: BLUE CROSS/BLUE SHIELD

## 2017-10-15 ENCOUNTER — Ambulatory Visit: Payer: BLUE CROSS/BLUE SHIELD

## 2017-10-15 DIAGNOSIS — F331 Major depressive disorder, recurrent, moderate: Secondary | ICD-10-CM | POA: Diagnosis not present

## 2017-10-15 DIAGNOSIS — F4310 Post-traumatic stress disorder, unspecified: Secondary | ICD-10-CM | POA: Diagnosis not present

## 2017-10-16 DIAGNOSIS — M4696 Unspecified inflammatory spondylopathy, lumbar region: Secondary | ICD-10-CM | POA: Diagnosis not present

## 2017-10-17 ENCOUNTER — Ambulatory Visit: Payer: BLUE CROSS/BLUE SHIELD | Admitting: Family Medicine

## 2017-10-20 ENCOUNTER — Ambulatory Visit (INDEPENDENT_AMBULATORY_CARE_PROVIDER_SITE_OTHER): Payer: BLUE CROSS/BLUE SHIELD

## 2017-10-20 ENCOUNTER — Ambulatory Visit (INDEPENDENT_AMBULATORY_CARE_PROVIDER_SITE_OTHER): Payer: BLUE CROSS/BLUE SHIELD | Admitting: Family Medicine

## 2017-10-20 ENCOUNTER — Encounter: Payer: Self-pay | Admitting: Family Medicine

## 2017-10-20 VITALS — BP 125/79 | HR 63 | Temp 97.9°F | Ht 70.0 in | Wt 207.0 lb

## 2017-10-20 DIAGNOSIS — S40011A Contusion of right shoulder, initial encounter: Secondary | ICD-10-CM | POA: Diagnosis not present

## 2017-10-20 DIAGNOSIS — M25511 Pain in right shoulder: Secondary | ICD-10-CM | POA: Diagnosis not present

## 2017-10-20 DIAGNOSIS — Z23 Encounter for immunization: Secondary | ICD-10-CM | POA: Diagnosis not present

## 2017-10-20 DIAGNOSIS — M25551 Pain in right hip: Secondary | ICD-10-CM | POA: Diagnosis not present

## 2017-10-20 DIAGNOSIS — S7001XA Contusion of right hip, initial encounter: Secondary | ICD-10-CM

## 2017-10-20 DIAGNOSIS — S4991XA Unspecified injury of right shoulder and upper arm, initial encounter: Secondary | ICD-10-CM | POA: Diagnosis not present

## 2017-10-20 DIAGNOSIS — S79911A Unspecified injury of right hip, initial encounter: Secondary | ICD-10-CM | POA: Diagnosis not present

## 2017-10-20 MED ORDER — DICLOFENAC SODIUM 75 MG PO TBEC: 75.0000 mg | DELAYED_RELEASE_TABLET | Freq: Two times a day (BID) | ORAL | 2 refills | Status: DC
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
## 2017-10-21 ENCOUNTER — Telehealth: Payer: Self-pay | Admitting: Family Medicine

## 2017-10-22 ENCOUNTER — Ambulatory Visit: Payer: BLUE CROSS/BLUE SHIELD | Attending: Neurology

## 2017-10-22 DIAGNOSIS — R42 Dizziness and giddiness: Secondary | ICD-10-CM | POA: Diagnosis not present

## 2017-10-22 DIAGNOSIS — R2689 Other abnormalities of gait and mobility: Secondary | ICD-10-CM
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
## 2017-10-24 ENCOUNTER — Encounter: Payer: Self-pay | Admitting: Physician Assistant

## 2017-10-24 ENCOUNTER — Ambulatory Visit: Payer: BLUE CROSS/BLUE SHIELD | Admitting: Physician Assistant

## 2017-10-24 DIAGNOSIS — M25552 Pain in left hip: Secondary | ICD-10-CM | POA: Insufficient documentation

## 2017-10-24 DIAGNOSIS — M19011 Primary osteoarthritis, right shoulder: Secondary | ICD-10-CM | POA: Diagnosis not present

## 2017-10-24 DIAGNOSIS — M19019 Primary osteoarthritis, unspecified shoulder: Secondary | ICD-10-CM | POA: Insufficient documentation

## 2017-10-24 DIAGNOSIS — M160 Bilateral primary osteoarthritis of hip: Secondary | ICD-10-CM | POA: Diagnosis not present

## 2017-10-24 DIAGNOSIS — M25511 Pain in right shoulder: Secondary | ICD-10-CM | POA: Diagnosis not present

## 2017-10-24 DIAGNOSIS — G8929 Other chronic pain: Secondary | ICD-10-CM

## 2017-10-24 DIAGNOSIS — M25551 Pain in right hip: Secondary | ICD-10-CM

## 2017-10-24 DIAGNOSIS — M19012 Primary osteoarthritis, left shoulder: Secondary | ICD-10-CM

## 2017-10-24 DIAGNOSIS — M87051 Idiopathic aseptic necrosis of right femur: Secondary | ICD-10-CM | POA: Diagnosis not present

## 2017-10-24 DIAGNOSIS — M1990 Unspecified osteoarthritis, unspecified site: Secondary | ICD-10-CM

## 2017-10-24 DIAGNOSIS — M25512 Pain in left shoulder: Secondary | ICD-10-CM | POA: Diagnosis not present

## 2017-10-24 MED ORDER — CYCLOBENZAPRINE HCL 10 MG PO TABS: 10.0000 mg | ORAL_TABLET | Freq: Three times a day (TID) | ORAL | 2 refills | Status: DC | PRN

## 2017-10-24 MED ORDER — MELOXICAM 7.5 MG PO TABS: 7.5000 mg | ORAL_TABLET | Freq: Every day | ORAL | 5 refills | Status: DC
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
## 2017-10-24 NOTE — Patient Instructions (Signed)
Arthritis Arthritis means joint pain. It can also mean joint disease. A joint is a place where bones come together. People who have arthritis may have:  Red joints.  Swollen joints.  Stiff joints.  Warm joints.  A fever.  A feeling of being sick.  Follow these instructions at home: Pay attention to any changes in your symptoms. Take these actions to help with your pain and swelling. Medicines  Take over-the-counter and prescription medicines only as told by your doctor.  Do not take aspirin for pain if your doctor says that you may have gout. Activity  Rest your joint if your doctor tells you to.  Avoid activities that make the pain worse.  Exercise your joint regularly as told by your doctor. Try doing exercises like: ? Swimming. ? Water aerobics. ? Biking. ? Walking. Joint Care   If your joint is swollen, keep it raised (elevated) if told by your doctor.  If your joint feels stiff in the morning, try taking a warm shower.  If you have diabetes, do not apply heat without asking your doctor.  If told, apply heat to the joint: ? Put a towel between the joint and the hot pack or heating pad. ? Leave the heat on the area for 20-30 minutes.  If told, apply ice to the joint: ? Put ice in a plastic bag. ? Place a towel between your skin and the bag. ? Leave the ice on for 20 minutes, 2-3 times per day.  Keep all follow-up visits as told by your doctor. Contact a doctor if:  The pain gets worse.  You have a fever. Get help right away if:  You have very bad pain in your joint.  You have swelling in your joint.  Your joint is red.  Many joints become painful and swollen.  You have very bad back pain.  Your leg is very weak.  You cannot control your pee (urine) or poop (stool). This information is not intended to replace advice given to you by your health care provider. Make sure you discuss any questions you have with your health care provider. Document  Released: 02/26/2010 Document Revised: 05/09/2016 Document Reviewed: 02/27/2015 Elsevier Interactive Patient Education  2018 [employer]  

## 2017-10-28 ENCOUNTER — Ambulatory Visit: Payer: BLUE CROSS/BLUE SHIELD

## 2017-10-28 DIAGNOSIS — R2689 Other abnormalities of gait and mobility: Secondary | ICD-10-CM | POA: Diagnosis not present

## 2017-10-28 DIAGNOSIS — F331 Major depressive disorder, recurrent, moderate: Secondary | ICD-10-CM | POA: Diagnosis not present

## 2017-10-28 DIAGNOSIS — F4310 Post-traumatic stress disorder, unspecified: Secondary | ICD-10-CM | POA: Diagnosis not present

## 2017-10-28 DIAGNOSIS — R42 Dizziness and giddiness: Secondary | ICD-10-CM | POA: Diagnosis not present

## 2017-10-28 NOTE — Therapy (Signed)
[facility] [address] [place], [place], [address] Phone: [phone]   Fax:  [phone]  Physical Therapy Treatment  Patient Details  Name: Marvin Espinoza MRN: [id number] Date of Birth: 1971/09/01 Referring Provider: Dr. Tomi Likens   Encounter Date: 10/28/2017  PT End of Session - 10/28/17 0942    Visit Number  2    Number of Visits  13    Date for PT Re-Evaluation  12/21/17    Authorization - Visit Number  2    Authorization - Number of Visits  30    PT Start Time  0848    PT Stop Time  3532    PT Time Calculation (min)  46 min    Equipment Utilized During Treatment  -- min guard to S and SOT harness    Activity Tolerance  Patient tolerated treatment well    Behavior During Therapy  [facility] for tasks assessed/performed       Past Medical History:  Diagnosis Date  . Anxiety   . Bipolar disorder ([place])   . CTS (carpal tunnel syndrome)   . Depression   . GERD (gastroesophageal reflux disease)   . H/O bariatric surgery   . Head injury 05/06/2017  . Hypothyroidism   . Hypothyroidism   . Sleep apnea    uses CPAP nightly  . Sleep apnea    wears CPAP nightly    Past Surgical History:  Procedure Laterality Date  . ANAL FISSURE REPAIR    . BARIATRIC SURGERY    . CARPAL TUNNEL RELEASE Left 05-09-2015  . CHOLECYSTECTOMY      There were no vitals filed for this visit.  Subjective Assessment - 10/28/17 0850    Subjective  Pt denied falls or changes since last visit.     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     Currently in Pain?  Yes    Pain Score  6     Pain Location  Head    Pain Orientation  Anterior forehead    Pain  Descriptors / Indicators  Headache    Pain Type  Acute pain    Pain Onset  Today    Pain Frequency  Constant    Aggravating Factors   unknown    Pain Relieving Factors  nothing yet         [facility] PT Assessment - 10/28/17 0908      Functional Gait  Assessment   Gait assessed   Yes    Gait Level Surface  Walks 20 ft in less than 7 sec but greater than 5.5 sec, uses assistive device, slower speed, mild gait deviations, or deviates 6-10 in outside of the 12 in walkway width. 5.54sec.    Change in Gait Speed  Able to change speed, demonstrates mild gait deviations, deviates 6-10 in outside of the 12 in walkway width, or no gait deviations, unable to achieve a major change in velocity, or uses a change in velocity, or uses an assistive device.    Gait with Horizontal Head Turns  Performs head turns smoothly with slight change in gait velocity (eg, minor disruption to smooth gait path), deviates 6-10 in outside 12 in walkway width,  or uses an assistive device. 4/10 dizzinesss    Gait with Vertical Head Turns  Performs task with slight change in gait velocity (eg, minor disruption to smooth gait path), deviates 6 - 10 in outside 12 in walkway width or uses assistive device 5/10 dizziness    Gait and Pivot Turn  Pivot turns safely within 3 sec and stops quickly with no loss of balance.    Step Over Obstacle  Is able to step over 2 stacked shoe boxes taped together (9 in total height) without changing gait speed. No evidence of imbalance.    Gait with Narrow Base of Support  Is able to ambulate for 10 steps heel to toe with no staggering.    Gait with Eyes Closed  Walks 20 ft, uses assistive device, slower speed, mild gait deviations, deviates 6-10 in outside 12 in walkway width. Ambulates 20 ft in less than 9 sec but greater than 7 sec. pt reported flashing lights with EC    Ambulating Backwards  Walks 20 ft, uses assistive device, slower speed, mild gait deviations, deviates 6-10 in outside 12 in  walkway width.    Steps  Alternating feet, no rail.    Total Score  24    FGA comment:  24/30: indicates pt is at moderate risk for falls.         Vestibular Assessment - 10/28/17 0856      Orthostatics   BP supine (x 5 minutes)  114/68 no dizziness lying supine    HR supine (x 5 minutes)  70    BP sitting  123/79 pt reported lightheadedness (5/10) upon sitting upright    HR sitting  72    BP standing (after 1 minute)  106/75 with 6-7/10 lightheadedness    HR standing (after 1 minute)  88    BP standing (after 3 minutes)  108/76    HR standing (after 3 minutes)  91    Orthostatics Comment  Pt reported lightheadedness is worse in standing and walking. pt reported he sometimes startes off into space and has difficulty concentrating.           Neuro re-ed: sensory organization test performed with following results: Conditions: 1: WFL 2: 3 trials below normal 3: 3 trials below normal  4: 2 below and one above normal 5: 1 below normal, 1 fall trial, 1 normal 6: 1 fall, 2 below normal  Composite score: 56 (~70  Is normal) Sensory Analysis Som: Below normal (~80) Vis: Below normal (~72) Vest: Below normal (~30) Pref: WNL Strategy analysis: Good use of hip and ankle strategy, except for decr. Hip strategy during LOB during conditions 5 and 6       COG alignment: Posterior and L lateral bias                   PT Education - 10/28/17 0939    Education provided  Yes    Education Details  PT discussed outcome measure results and orthostatic testing. PT encouraged pt to stay hydrated in order to not become dehydrated.     Person(s) Educated  Patient;Spouse    Methods  Explanation    Comprehension  Verbalized understanding       PT Short Term Goals - 10/22/17 1621      PT SHORT TERM GOAL #1   Title  Pt will be IND in HEP to improve dizziness and balance. TARGET DATE FOR ALL STGS: 11/19/17    Status  New  PT SHORT TERM GOAL #2   Title  Pt will report  dizziness at worst </=3/10 to improve QOL and safety during work.     Status  New      PT SHORT TERM GOAL #3   Title  Perform SOT, FGA, and orthostatic testing and write STGs and LTGs as indicated.     Status  New        PT Long Term Goals - 10/22/17 1622      PT LONG TERM GOAL #1   Title  Pt will report dizziness at worst </=1/10 to improve quality of life and safety at work. TARGET DATE FOR ALL LTGS: 12/17/17    Status  New      PT LONG TERM GOAL #2   Title  Pt will perform DVA vs. SVA testing with line difference no greater than 3 to improve VOR.     Status  New      PT LONG TERM GOAL #3   Title  Pt will report he is able to perform work duties on ladder in a safe manner, and no LOB.     Status  New            Plan - 10/28/17 0942    Clinical Impression Statement  Pt's FGA score indicates pt is at a moderate risk for falls. Pt reported concordant lightheadedness during orthostatic hypotension testing but BP did not fluctuate significantly, but did incr. during supine to sit and decr. during sit to stand. Pt's SOT composite score was below normal for his age group and gender. Somatosensory, visual, and vestibular input all decr below norms for his age and gender. Pt experienced s/s consistent with concussion type s/s. Pt will utilize concussion protocol to slowly progress (in graded manner) activity and focus on balance (vestibular) training. Pt would benefit from skilled PT to improve safety during functional mobility.     Rehab Potential  Good    Clinical Impairments Affecting Rehab Potential  Hypothyroidism, DDD of Lx spine, bipolar disorder, sleep apnea (not currently using machine), depression, hx of concussion 08/2016, hx of gastic bypass procedure in 2016    PT Frequency  Other (comment) 2x/week for 4 weeks and 1x/week for 4 weeks    PT Duration  Other (comment)    PT Treatment/Interventions  ADLs/Self Care Home Management;Biofeedback;Canalith Repostioning;Therapeutic  activities;Functional mobility training;Stair training;Gait training;Patient/family education;Neuromuscular re-education;Balance training;Orthotic Fit/Training;Manual techniques;Vestibular    PT Next Visit Plan  Review SOT results, Provide x1 viewing HEP and add balance (vestibular) HEP.     Consulted and Agree with Plan of Care  Patient;Family member/caregiver    Family Member Consulted  [place]       Patient will benefit from skilled therapeutic intervention in order to improve the following deficits and impairments:  Abnormal gait, Dizziness, Decreased balance, Decreased mobility, Impaired flexibility, Decreased activity tolerance, Decreased knowledge of use of DME  Visit Diagnosis: Dizziness and giddiness  Other abnormalities of gait and mobility     Problem List Patient Active Problem List   Diagnosis Date Noted  . Bilateral hip pain 10/24/2017  . Shoulder pain, bilateral 10/24/2017  . Arthritis 10/24/2017  . Osteoarthritis of both hips 10/24/2017  . Avascular necrosis of bone of hip, right ([place]) 10/24/2017  . 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 PTSD (post-traumatic stress disorder) 08/20/2017  . Tardive dyskinesia 08/20/2017  . Severe major depression ([place]) 05/10/2017  . Affective psychosis, bipolar ([place]) 05/10/2017  . Vision disturbance 05/06/2017  . History of  concussion 02/10/2017  . Mood disorder ([place]) 02/10/2017  . Adjustment insomnia 01/24/2017  . Adjustment disorder with anxious mood 01/24/2017  . GAD (generalized anxiety disorder) 01/24/2017  . Gastroesophageal reflux disease without esophagitis 11/19/2016  . Hypothyroidism 11/19/2016  . DDD (degenerative disc disease), lumbar 11/19/2016    Marvin Espinoza L 10/28/2017, 9:54 AM  [facility] [address] [place], [place], [address] Phone: [phone]   Fax:  [phone]  Name: Marvin Espinoza MRN: [id number] Date of Birth:  1971-04-25  Geoffry Paradise, PT,DPT 10/28/17 9:57 AM Phone: [phone] Fax: [phone]

## 2017-10-30 DIAGNOSIS — M5126 Other intervertebral disc displacement, lumbar region: Secondary | ICD-10-CM | POA: Diagnosis not present

## 2017-10-30 DIAGNOSIS — M545 Low back pain: Secondary | ICD-10-CM | POA: Diagnosis not present

## 2017-10-30 DIAGNOSIS — M4696 Unspecified inflammatory spondylopathy, lumbar region: Secondary | ICD-10-CM | POA: Diagnosis not present

## 2017-10-30 DIAGNOSIS — G8929 Other chronic pain: Secondary | ICD-10-CM | POA: Diagnosis not present

## 2017-10-31 DIAGNOSIS — M5126 Other intervertebral disc displacement, lumbar region: Secondary | ICD-10-CM | POA: Diagnosis not present

## 2017-11-03 ENCOUNTER — Other Ambulatory Visit: Payer: Self-pay | Admitting: Physician Assistant

## 2017-11-03 MED ORDER — AZITHROMYCIN 250 MG PO TABS: ORAL_TABLET | ORAL | 0 refills | Status: DC

## 2017-11-04 NOTE — Telephone Encounter (Signed)
Patient seen since phone call.  This encounter will now be closed 

## 2017-11-05 DIAGNOSIS — F332 Major depressive disorder, recurrent severe without psychotic features: Secondary | ICD-10-CM | POA: Diagnosis not present

## 2017-11-07 DIAGNOSIS — F250 Schizoaffective disorder, bipolar type: Secondary | ICD-10-CM | POA: Diagnosis not present

## 2017-11-10 DIAGNOSIS — F250 Schizoaffective disorder, bipolar type: Secondary | ICD-10-CM | POA: Diagnosis not present

## 2017-11-11 ENCOUNTER — Ambulatory Visit: Payer: BLUE CROSS/BLUE SHIELD | Admitting: Physical Therapy

## 2017-11-11 DIAGNOSIS — F250 Schizoaffective disorder, bipolar type: Secondary | ICD-10-CM | POA: Diagnosis not present

## 2017-11-12 DIAGNOSIS — F250 Schizoaffective disorder, bipolar type: Secondary | ICD-10-CM | POA: Diagnosis not present

## 2017-11-13 DIAGNOSIS — F250 Schizoaffective disorder, bipolar type: Secondary | ICD-10-CM | POA: Diagnosis not present

## 2017-11-14 DIAGNOSIS — F250 Schizoaffective disorder, bipolar type: Secondary | ICD-10-CM | POA: Diagnosis not present

## 2017-11-14 DIAGNOSIS — Z6829 Body mass index (BMI) 29.0-29.9, adult: Secondary | ICD-10-CM | POA: Diagnosis not present

## 2017-11-14 DIAGNOSIS — E663 Overweight: Secondary | ICD-10-CM | POA: Diagnosis not present

## 2017-11-14 DIAGNOSIS — Z713 Dietary counseling and surveillance: Secondary | ICD-10-CM | POA: Diagnosis not present

## 2017-11-14 DIAGNOSIS — Z9884 Bariatric surgery status: Secondary | ICD-10-CM | POA: Diagnosis not present

## 2017-11-14 DIAGNOSIS — R739 Hyperglycemia, unspecified: Secondary | ICD-10-CM | POA: Diagnosis not present

## 2017-11-17 ENCOUNTER — Encounter: Payer: Self-pay | Admitting: Physical Therapy

## 2017-11-17 DIAGNOSIS — F250 Schizoaffective disorder, bipolar type: Secondary | ICD-10-CM | POA: Diagnosis not present

## 2017-11-18 DIAGNOSIS — F250 Schizoaffective disorder, bipolar type: Secondary | ICD-10-CM | POA: Diagnosis not present

## 2017-11-19 DIAGNOSIS — F250 Schizoaffective disorder, bipolar type: Secondary | ICD-10-CM | POA: Diagnosis not present

## 2017-11-20 DIAGNOSIS — F250 Schizoaffective disorder, bipolar type: Secondary | ICD-10-CM | POA: Diagnosis not present

## 2017-11-24 ENCOUNTER — Ambulatory Visit: Payer: BLUE CROSS/BLUE SHIELD | Admitting: Physician Assistant

## 2017-11-24 ENCOUNTER — Encounter: Payer: Self-pay | Admitting: Physical Therapy

## 2017-11-26 DIAGNOSIS — F332 Major depressive disorder, recurrent severe without psychotic features: Secondary | ICD-10-CM | POA: Diagnosis not present

## 2017-11-26 NOTE — Therapy (Signed)
[facility] [address] [place], [place], [address] Phone: [phone]   Fax:  [phone]  Patient Details  Name: Marvin Espinoza MRN: [id number] Date of Birth: 1971/01/25 Referring Provider:  No ref. provider found  Encounter Date: 11/26/2017   PHYSICAL THERAPY DISCHARGE SUMMARY  Visits from Start of Care: 2  Current functional level related to goals / functional outcomes: PT Long Term Goals - 10/22/17 1622      PT LONG TERM GOAL #1   Title  Pt will report dizziness at worst </=1/10 to improve quality of life and safety at work. TARGET DATE FOR ALL LTGS: 12/17/17    Status  New      PT LONG TERM GOAL #2   Title  Pt will perform DVA vs. SVA testing with line difference no greater than 3 to improve VOR.     Status  New      PT LONG TERM GOAL #3   Title  Pt will report he is able to perform work duties on ladder in a safe manner, and no LOB.     Status  New         Remaining deficits: Unknown, as pt cancelled all remaining appt's due to an unforseen event (per family member).   Education / Equipment: HEP  Plan: Patient agrees to discharge.  Patient goals were not met. Patient is being discharged due to not returning since the last visit.  ?????       [name] 11/26/2017, 11:26 AM  [facility] [address] [place], [place], [address] Phone: [phone]   Fax:  [phone]   Geoffry Paradise, PT,DPT 11/26/17 11:27 AM Phone: [phone] Fax: [phone]

## 2017-11-27 DIAGNOSIS — F319 Bipolar disorder, unspecified: Secondary | ICD-10-CM | POA: Diagnosis not present

## 2017-12-01 ENCOUNTER — Encounter: Payer: Self-pay | Admitting: Physician Assistant

## 2017-12-01 ENCOUNTER — Encounter: Payer: Self-pay | Admitting: Physical Therapy

## 2017-12-01 ENCOUNTER — Ambulatory Visit (INDEPENDENT_AMBULATORY_CARE_PROVIDER_SITE_OTHER): Payer: BLUE CROSS/BLUE SHIELD | Admitting: Physician Assistant

## 2017-12-01 VITALS — BP 127/88 | HR 60 | Temp 99.0°F | Ht 70.0 in | Wt 205.6 lb

## 2017-12-01 DIAGNOSIS — F315 Bipolar disorder, current episode depressed, severe, with psychotic features: Secondary | ICD-10-CM

## 2017-12-01 DIAGNOSIS — F322 Major depressive disorder, single episode, severe without psychotic features: Secondary | ICD-10-CM

## 2017-12-01 DIAGNOSIS — F4310 Post-traumatic stress disorder, unspecified: Secondary | ICD-10-CM | POA: Diagnosis not present

## 2017-12-01 NOTE — Progress Notes (Signed)
BP 127/88   Pulse 60   Temp 99 F (37.2 C) (Oral)   Ht 5\' 10"  (1.778 m)   Wt 205 lb 9.6 oz (93.3 kg)   BMI 29.50 kg/m    Subjective:    Patient ID: Marvin Espinoza, male    DOB: 1971-04-19, 46 y.o.   MRN: [id number]  HPI: Marvin Espinoza is a 46 y.o. male presenting on 12/01/2017 for Follow-up (4 week )  This patient comes in for periodic recheck on medications and conditions including PTSD, andxiety, Mood disorder.  Had 10 days daytime program at [facility].  From there he got in with Dr. Darene Lamer and seems to have some help.  He will see the psychologist next. Now on Zoloft and lithium.  Cannot tell a big difference yet.  Working Systems analyst PTSD.  Hardly driving at all.  Very disabled by this condition. In the past year has only worked about 2 months.  Others have to drive him.  All medications are reviewed today. There are no reports of any problems with the medications. All of the medical conditions are reviewed and updated.  Lab work is reviewed and will be ordered as medically necessary. There are no new problems reported with today's visit.   Relevant past medical, surgical, family and social history reviewed and updated as indicated. Allergies and medications reviewed and updated.  Past Medical History:  Diagnosis Date  . Anxiety   . Bipolar disorder ([place])   . CTS (carpal tunnel syndrome)   . Depression   . GERD (gastroesophageal reflux disease)   . H/O bariatric surgery   . Head injury 05/06/2017  . Hypothyroidism   . Hypothyroidism   . Sleep apnea    uses CPAP nightly  . Sleep apnea    wears CPAP nightly    Past Surgical History:  Procedure Laterality Date  . ANAL FISSURE REPAIR    . BARIATRIC SURGERY    . CARPAL TUNNEL RELEASE Left 05/09/2015   Procedure: LEFT CARPAL TUNNEL RELEASE;  Surgeon: Daryll Brod, MD;  Location: [facility];  Service: Orthopedics;  Laterality: Left;  . CARPAL TUNNEL RELEASE Left 05-09-2015  . CARPAL TUNNEL RELEASE Right 06/20/2015   Procedure: RIGHT CARPAL TUNNEL RELEASE;  Surgeon: Daryll Brod, MD;  Location: [facility];  Service: Orthopedics;  Laterality: Right;  REGIONAL/FAB  . CHOLECYSTECTOMY      Review of Systems  Constitutional: Positive for fatigue. Negative for appetite change.  HENT: Negative.   Eyes: Negative.  Negative for pain and visual disturbance.  Respiratory: Negative.  Negative for cough, chest tightness, shortness of breath and wheezing.   Cardiovascular: Negative.  Negative for chest pain, palpitations and leg swelling.  Gastrointestinal: Negative.  Negative for abdominal pain, diarrhea, nausea and vomiting.  Endocrine: Negative.   Genitourinary: Negative.   Musculoskeletal: Negative.   Skin: Negative.  Negative for color change and rash.  Neurological: Negative.  Negative for weakness, numbness and headaches.  Psychiatric/Behavioral: Positive for decreased concentration, dysphoric mood and sleep disturbance. Negative for suicidal ideas. The patient is nervous/anxious.     Allergies as of 12/01/2017      Reactions   Abilify [aripiprazole] Anaphylaxis   Seroquel [quetiapine Fumarate]    tardive dyskinesia   Alcohol Itching   Drinking alcohol   Cariprazine Other (See Comments)   Hydrocodone-acetaminophen Itching   Lurasidone Other (See Comments)   dysconesia   Victoza [liraglutide] Other (See Comments)   Severe heartburn   Victoza [liraglutide]  Severe heartburn   Vraylar [cariprazine Hcl]    Bupropion Rash   Risperidone Rash      Medication List        Accurate as of 12/01/17  2:27 PM. Always use your most recent med list.          ALPRAZolam 1 MG tablet Commonly known as:  XANAX Take 1 mg by mouth as needed.   CALCIUM 1200 PO Take by mouth.   cyclobenzaprine 10 MG tablet Commonly known as:  FLEXERIL Take 1 tablet (10 mg total) by mouth 3 (three) times daily as needed for muscle spasms.   lamoTRIgine 150 MG tablet Commonly known as:  LAMICTAL     levothyroxine 100 MCG tablet Commonly known as:  SYNTHROID, LEVOTHROID TAKE ONE (1) TABLET EACH DAY   lithium carbonate 450 MG CR tablet Commonly known as:  ESKALITH Take 450 mg by mouth.   multivitamin with minerals Tabs tablet Take 1 tablet by mouth 2 (two) times daily. For Vitamin supplementation   omeprazole 40 MG capsule Commonly known as:  PRILOSEC TAKE ONE (1) CAPSULE EACH DAY   sertraline 100 MG tablet Commonly known as:  ZOLOFT   zolpidem 10 MG tablet Commonly known as:  AMBIEN          Objective:    BP 127/88   Pulse 60   Temp 99 F (37.2 C) (Oral)   Ht 5\' 10"  (1.778 m)   Wt 205 lb 9.6 oz (93.3 kg)   BMI 29.50 kg/m   Allergies  Allergen Reactions  . Abilify [Aripiprazole] Anaphylaxis  . Seroquel [Quetiapine Fumarate]     tardive dyskinesia  . Alcohol Itching    Drinking alcohol  . Cariprazine Other (See Comments)  . Hydrocodone-Acetaminophen Itching  . Lurasidone Other (See Comments)    dysconesia  . Victoza [Liraglutide] Other (See Comments)    Severe heartburn   . Victoza [Liraglutide]     Severe heartburn  . Vraylar [Cariprazine Hcl]   . Bupropion Rash  . Risperidone Rash    Physical Exam  Constitutional: He appears well-developed and well-nourished. No distress.  HENT:  Head: Normocephalic and atraumatic.  Eyes: Conjunctivae and EOM are normal. Pupils are equal, round, and reactive to light.  Cardiovascular: Normal rate, regular rhythm and normal heart sounds.  Pulmonary/Chest: Effort normal and breath sounds normal. No respiratory distress.  Skin: Skin is warm and dry.  Psychiatric: Judgment and thought content normal. His affect is blunt. His speech is delayed. His speech is not slurred. He is slowed. Cognition and memory are normal. He exhibits a depressed mood.  Nursing note and vitals reviewed.       Assessment & Plan:   1. PTSD (post-traumatic stress disorder)  2. Severe major depression ([place])  3. Bipolar disorder,  current episode depressed, severe, with psychotic features ([place])    Current Outpatient Medications:  .  ALPRAZolam (XANAX) 1 MG tablet, Take 1 mg by mouth as needed., Disp: , Rfl: 0 .  Calcium Carbonate-Vit D-Min (CALCIUM 1200 PO), Take by mouth., Disp: , Rfl:  .  cyclobenzaprine (FLEXERIL) 10 MG tablet, Take 1 tablet (10 mg total) by mouth 3 (three) times daily as needed for muscle spasms., Disp: 1 tablet, Rfl: 0 .  lamoTRIgine (LAMICTAL) 150 MG tablet, , Disp: , Rfl: 1 .  levothyroxine (SYNTHROID, LEVOTHROID) 100 MCG tablet, TAKE ONE (1) TABLET EACH DAY, Disp: 90 tablet, Rfl: 5 .  lithium carbonate (ESKALITH) 450 MG CR tablet, Take 450 mg by mouth.,  Disp: , Rfl:  .  Multiple Vitamin (MULTIVITAMIN WITH MINERALS) TABS tablet, Take 1 tablet by mouth 2 (two) times daily. For Vitamin supplementation, Disp: , Rfl:  .  omeprazole (PRILOSEC) 40 MG capsule, TAKE ONE (1) CAPSULE EACH DAY, Disp: 90 capsule, Rfl: 3 .  sertraline (ZOLOFT) 100 MG tablet, , Disp: , Rfl: 0 .  zolpidem (AMBIEN) 10 MG tablet, , Disp: , Rfl: 0 Continue all other maintenance medications as listed above.  Follow up plan: Return in about 3 months (around 03/01/2018) for recheck.  Educational handout given for [place] PA-C [facility] [address]  [place], [place] [address] [phone]   12/01/2017, 2:27 PM

## 2017-12-01 NOTE — Patient Instructions (Signed)
In a few days you may receive a survey in the mail or online from [employer] regarding your visit with us today. Please take a moment to fill this out. Your feedback is very important to our whole office. It can help us better understand your needs as well as improve your experience and satisfaction. Thank you for taking your time to complete it. We care about you.  Angala Hilgers, PA-C  

## 2017-12-02 DIAGNOSIS — F332 Major depressive disorder, recurrent severe without psychotic features: Secondary | ICD-10-CM | POA: Diagnosis not present

## 2017-12-17 DIAGNOSIS — F332 Major depressive disorder, recurrent severe without psychotic features: Secondary | ICD-10-CM | POA: Diagnosis not present

## 2017-12-18 DIAGNOSIS — F333 Major depressive disorder, recurrent, severe with psychotic symptoms: Secondary | ICD-10-CM | POA: Diagnosis not present

## 2017-12-24 DIAGNOSIS — F319 Bipolar disorder, unspecified: Secondary | ICD-10-CM | POA: Diagnosis not present

## 2018-01-08 DIAGNOSIS — F4310 Post-traumatic stress disorder, unspecified: Secondary | ICD-10-CM | POA: Diagnosis not present

## 2018-01-08 DIAGNOSIS — F3131 Bipolar disorder, current episode depressed, mild: Secondary | ICD-10-CM | POA: Diagnosis not present

## 2018-01-08 DIAGNOSIS — F3111 Bipolar disorder, current episode manic without psychotic features, mild: Secondary | ICD-10-CM | POA: Diagnosis not present

## 2018-02-19 DIAGNOSIS — F3176 Bipolar disorder, in full remission, most recent episode depressed: Secondary | ICD-10-CM | POA: Diagnosis not present

## 2018-02-19 DIAGNOSIS — F3111 Bipolar disorder, current episode manic without psychotic features, mild: Secondary | ICD-10-CM | POA: Diagnosis not present

## 2018-03-02 ENCOUNTER — Ambulatory Visit: Payer: BLUE CROSS/BLUE SHIELD | Admitting: Physician Assistant

## 2018-03-02 ENCOUNTER — Encounter: Payer: Self-pay | Admitting: Physician Assistant

## 2018-03-02 VITALS — BP 112/72 | HR 53 | Temp 97.4°F | Ht 70.0 in | Wt 225.0 lb

## 2018-03-02 DIAGNOSIS — J329 Chronic sinusitis, unspecified: Secondary | ICD-10-CM

## 2018-03-02 DIAGNOSIS — F315 Bipolar disorder, current episode depressed, severe, with psychotic features: Secondary | ICD-10-CM

## 2018-03-02 DIAGNOSIS — F4310 Post-traumatic stress disorder, unspecified: Secondary | ICD-10-CM | POA: Diagnosis not present

## 2018-03-02 DIAGNOSIS — K5900 Constipation, unspecified: Secondary | ICD-10-CM | POA: Diagnosis not present

## 2018-03-02 MED ORDER — LACTULOSE 10 GM/15ML PO SOLN: 10.0000 g | Freq: Two times a day (BID) | ORAL | 5 refills | Status: DC

## 2018-03-02 MED ORDER — AMOXICILLIN-POT CLAVULANATE 875-125 MG PO TABS: 1.0000 | ORAL_TABLET | Freq: Two times a day (BID) | ORAL | 0 refills | Status: DC

## 2018-03-02 NOTE — Patient Instructions (Signed)
In a few days you may receive a survey in the mail or online from [employer] regarding your visit with us today. Please take a moment to fill this out. Your feedback is very important to our whole office. It can help us better understand your needs as well as improve your experience and satisfaction. Thank you for taking your time to complete it. We care about you.  Zyler Hyson, PA-C  
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
## 2018-03-12 ENCOUNTER — Other Ambulatory Visit: Payer: Self-pay | Admitting: Physician Assistant

## 2018-03-12 ENCOUNTER — Telehealth: Payer: Self-pay | Admitting: Physician Assistant

## 2018-03-12 MED ORDER — LUBIPROSTONE 24 MCG PO CAPS: 24.0000 ug | ORAL_CAPSULE | Freq: Two times a day (BID) | ORAL | 5 refills | Status: DC

## 2018-03-12 NOTE — Telephone Encounter (Signed)
Patient aware.

## 2018-03-12 NOTE — Telephone Encounter (Signed)
Marvin Espinoza is sent to pharmacy

## 2018-03-20 ENCOUNTER — Ambulatory Visit: Payer: BLUE CROSS/BLUE SHIELD | Admitting: Physician Assistant

## 2018-03-20 ENCOUNTER — Encounter: Payer: Self-pay | Admitting: Physician Assistant

## 2018-03-20 VITALS — BP 115/72 | HR 65 | Temp 97.5°F | Ht 70.0 in | Wt 224.6 lb

## 2018-03-20 DIAGNOSIS — M25551 Pain in right hip: Secondary | ICD-10-CM

## 2018-03-20 DIAGNOSIS — M6740 Ganglion, unspecified site: Secondary | ICD-10-CM

## 2018-03-20 NOTE — Progress Notes (Signed)
BP 115/72   Pulse 65   Temp (!) 97.5 F (36.4 C) (Oral)   Ht 5\' 10"  (1.778 m)   Wt 224 lb 9.6 oz (101.9 kg)   BMI 32.23 kg/m    Subjective:    Patient ID: Marvin Espinoza, male    DOB: [birth year], 47 y.o.   MRN: [id number]  HPI: Marvin Espinoza is a 47 y.o. male presenting on 03/20/2018 for Cyst (on right foot ) and Hip Pain (right )  Patient comes in today with increased pain in his right hip.  He had had problems with this in the past.  He has lost a significant amount of weight after bariatric surgery.  He is always been an exceptionally active person even when he was overweight.  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  Bothering him the last few years.  He has significant pain when he goes from sitting to standing.  He feels very slow and stiff in the right hip.  He now starting to have pain even to the lateral portion of the leg.  And it can go down the lateral thigh at times.    e also had an episode where his feet were crossed and when he woke up one morning at that area of the pressure point there was a small swollen area on the right lateral portion of his foot.  It is specific to that spot.  It has been tender and sensitive ever since then.  This happened about 4 days ago.  Past Medical History:  Diagnosis Date  . Anxiety   . Bipolar disorder ([place])   . CTS (carpal tunnel syndrome)   . Depression   . GERD (gastroesophageal reflux disease)   . H/O bariatric surgery   . Head injury 05/06/2017  . Hypothyroidism   . Hypothyroidism   . Sleep apnea    uses CPAP nightly  . Sleep apnea    wears CPAP nightly   Relevant past medical, surgical, family and social history reviewed and updated as indicated. Interim medical history since our last visit reviewed. Allergies and medications reviewed and updated. DATA REVIEWED: CHART IN EPIC  Family History reviewed for pertinent findings.  Review of Systems  Constitutional: Negative.  Negative  for appetite change and fatigue.  HENT: Negative.   Eyes: Negative.  Negative for pain and visual disturbance.  Respiratory: Negative.  Negative for cough, chest tightness, shortness of breath and wheezing.   Cardiovascular: Negative.  Negative for chest pain, palpitations and leg swelling.  Gastrointestinal: Negative.  Negative for abdominal pain, diarrhea, nausea and vomiting.  Endocrine: Negative.   Genitourinary: Negative.   Musculoskeletal: Positive for arthralgias, gait problem and myalgias.  Skin: Negative.  Negative for color change and rash.  Neurological: Negative for weakness, numbness and headaches.  Psychiatric/Behavioral: Negative.     Allergies as of 03/20/2018      Reactions   Abilify [aripiprazole] Anaphylaxis   Seroquel [quetiapine Fumarate]    tardive dyskinesia   Alcohol Itching   Drinking alcohol   Cariprazine Other (See Comments)   Hydrocodone-acetaminophen Itching   Lurasidone Other (See Comments)   dysconesia   Victoza [liraglutide] Other (See Comments)   Severe heartburn   Victoza [liraglutide]    Severe heartburn   Vraylar [cariprazine Hcl]    Bupropion Rash   Risperidone Rash      Medication List        Accurate as of 03/20/18  1:26 PM. Always use your most recent med list.          ALPRAZolam 1 MG tablet Commonly known as:  XANAX Take 1 mg by mouth as needed.   CALCIUM 1200 PO Take by mouth.   Carbonyl Iron 15 MG Chew Chew by mouth.   cyclobenzaprine 10 MG tablet Commonly known as:  FLEXERIL Take 1 tablet (10 mg total) by mouth 3 (three) times daily as needed for muscle spasms.   lamoTRIgine 150 MG tablet Commonly known as:  LAMICTAL 150 mg 2 (two) times daily.   levothyroxine 100 MCG tablet Commonly known as:  SYNTHROID, LEVOTHROID TAKE ONE (1) TABLET EACH DAY   lithium carbonate 450 MG CR tablet Commonly known as:  ESKALITH Take 450 mg by mouth.   lubiprostone 24 MCG capsule Commonly known as:  AMITIZA Take 1 capsule (24  mcg total) by mouth 2 (two) times daily with a meal.   multivitamin with minerals Tabs tablet Take 1 tablet by mouth 2 (two) times daily. For Vitamin supplementation   omeprazole 40 MG capsule Commonly known as:  PRILOSEC TAKE ONE (1) CAPSULE EACH DAY   vitamin B-12 250 MCG tablet Commonly known as:  CYANOCOBALAMIN Take 250 mcg by mouth daily.   zolpidem 10 MG tablet Commonly known as:  AMBIEN          Objective:    BP 115/72   Pulse 65   Temp (!) 97.5 F (36.4 C) (Oral)   Ht 5\' 10"  (1.778 m)   Wt 224 lb 9.6 oz (101.9 kg)   BMI 32.23 kg/m   Allergies  Allergen Reactions  . Abilify [Aripiprazole] Anaphylaxis  . Seroquel [Quetiapine Fumarate]     tardive dyskinesia  . Alcohol Itching    Drinking alcohol  . Cariprazine Other (See Comments)  . Hydrocodone-Acetaminophen Itching  . Lurasidone Other (See Comments)    dysconesia  . Victoza [Liraglutide] Other (See Comments)    Severe heartburn   . Victoza [Liraglutide]     Severe heartburn  . Vraylar [Cariprazine Hcl]   . Bupropion Rash  . Risperidone Rash    Wt Readings from Last 3 Encounters:  03/20/18 224 lb 9.6 oz (101.9 kg)  03/02/18 225 lb (102.1 kg)  12/01/17 205 lb 9.6 oz (93.3 kg)    Physical Exam  Constitutional: He appears well-developed and well-nourished. No distress.  HENT:  Head: Normocephalic and atraumatic.  Eyes: Pupils are equal, round, and reactive to light. Conjunctivae and EOM are normal.  Cardiovascular: Normal rate, regular rhythm and normal heart sounds.  Pulmonary/Chest: Effort normal and breath sounds normal. No respiratory distress.  Musculoskeletal: He exhibits tenderness.       Right hip: He exhibits decreased range of motion, tenderness and swelling. He exhibits no crepitus and no deformity.       Legs: Skin: Skin is warm and dry.  Psychiatric: He has a normal mood and affect. His behavior is normal.  Nursing note and vitals reviewed.       Assessment & Plan:   1. Pain  of right hip joint - Ambulatory referral to Orthopedic Surgery Ibuprofen 200 mg on rare occasion  2. Ganglion cyst reassure   Continue all other maintenance medications as listed above.  Follow up plan: Return if symptoms worsen or fail to improve.  Educational handout given for [place] PA-C [place] [address]  [place], [place] [address] [phone]   03/20/2018, 1:26 PM

## 2018-03-20 NOTE — Patient Instructions (Signed)
In a few days you may receive a survey in the mail or online from [employer] regarding your visit with us today. Please take a moment to fill this out. Your feedback is very important to our whole office. It can help us better understand your needs as well as improve your experience and satisfaction. Thank you for taking your time to complete it. We care about you.  Keylan Costabile, PA-C  

## 2018-03-24 ENCOUNTER — Telehealth: Payer: Self-pay | Admitting: Physician Assistant

## 2018-03-24 ENCOUNTER — Other Ambulatory Visit: Payer: Self-pay | Admitting: Physician Assistant

## 2018-03-24 DIAGNOSIS — K5900 Constipation, unspecified: Secondary | ICD-10-CM

## 2018-03-24 NOTE — Telephone Encounter (Signed)
Patient is requesting referral to gastro for constipation

## 2018-03-24 NOTE — Telephone Encounter (Signed)
Order placed

## 2018-03-24 NOTE — Telephone Encounter (Signed)
Preference? [facility] or [facility]

## 2018-03-24 NOTE — Progress Notes (Signed)
gastro 

## 2018-03-24 NOTE — Telephone Encounter (Signed)
Patient aware.

## 2018-03-24 NOTE — Telephone Encounter (Signed)
[facility]

## 2018-04-02 ENCOUNTER — Encounter: Payer: Self-pay | Admitting: Gastroenterology

## 2018-04-07 DIAGNOSIS — M25551 Pain in right hip: Secondary | ICD-10-CM | POA: Diagnosis not present

## 2018-04-07 DIAGNOSIS — M25552 Pain in left hip: Secondary | ICD-10-CM | POA: Diagnosis not present

## 2018-04-07 DIAGNOSIS — F3131 Bipolar disorder, current episode depressed, mild: Secondary | ICD-10-CM | POA: Diagnosis not present

## 2018-04-11 ENCOUNTER — Encounter: Payer: Self-pay | Admitting: Physician Assistant

## 2018-04-11 DIAGNOSIS — M25551 Pain in right hip: Secondary | ICD-10-CM | POA: Diagnosis not present

## 2018-04-14 DIAGNOSIS — M1611 Unilateral primary osteoarthritis, right hip: Secondary | ICD-10-CM | POA: Diagnosis not present

## 2018-04-14 DIAGNOSIS — M25551 Pain in right hip: Secondary | ICD-10-CM | POA: Diagnosis not present

## 2018-04-15 DIAGNOSIS — F332 Major depressive disorder, recurrent severe without psychotic features: Secondary | ICD-10-CM | POA: Diagnosis not present

## 2018-04-15 DIAGNOSIS — F4310 Post-traumatic stress disorder, unspecified: Secondary | ICD-10-CM | POA: Diagnosis not present

## 2018-04-15 DIAGNOSIS — M1611 Unilateral primary osteoarthritis, right hip: Secondary | ICD-10-CM | POA: Diagnosis not present

## 2018-04-27 DIAGNOSIS — M25511 Pain in right shoulder: Secondary | ICD-10-CM | POA: Diagnosis not present

## 2018-04-27 DIAGNOSIS — M7541 Impingement syndrome of right shoulder: Secondary | ICD-10-CM | POA: Diagnosis not present

## 2018-04-29 ENCOUNTER — Ambulatory Visit: Payer: BLUE CROSS/BLUE SHIELD | Admitting: Physician Assistant

## 2018-04-29 ENCOUNTER — Encounter: Payer: Self-pay | Admitting: Physician Assistant

## 2018-04-29 VITALS — BP 132/88 | HR 49 | Temp 97.2°F | Ht 70.0 in | Wt 214.0 lb

## 2018-04-29 DIAGNOSIS — M19011 Primary osteoarthritis, right shoulder: Secondary | ICD-10-CM

## 2018-04-29 DIAGNOSIS — M5136 Other intervertebral disc degeneration, lumbar region: Secondary | ICD-10-CM

## 2018-04-29 DIAGNOSIS — F322 Major depressive disorder, single episode, severe without psychotic features: Secondary | ICD-10-CM

## 2018-04-29 DIAGNOSIS — M160 Bilateral primary osteoarthritis of hip: Secondary | ICD-10-CM

## 2018-04-29 DIAGNOSIS — M19012 Primary osteoarthritis, left shoulder: Secondary | ICD-10-CM

## 2018-04-29 DIAGNOSIS — M1611 Unilateral primary osteoarthritis, right hip: Secondary | ICD-10-CM | POA: Diagnosis not present

## 2018-04-29 DIAGNOSIS — M2550 Pain in unspecified joint: Secondary | ICD-10-CM | POA: Diagnosis not present

## 2018-04-29 NOTE — Progress Notes (Signed)
BP 132/88   Pulse (!) 49   Temp (!) 97.2 F (36.2 C) (Oral)   Ht 5\' 10"  (1.778 m)   Wt 214 lb (97.1 kg)   BMI 30.71 kg/m     Subjective:    Patient ID: Marvin Espinoza, male    DOB: [birth year], 47 y.o.   MRN: [id number]  HPI: Marvin Espinoza is a 47 y.o. male presenting on 04/29/2018 for joints hurting all over (Wants blood work)  Patient comes in for recheck on his chronic medical conditions.  He is having a lot of joint pain all over his body.  He has been to orthopedics for his shoulder knee and hip.  Their concern was that he did have some other degenerative conditions like lupus or rheumatoid arthritis we have had a discussion about having blood tests performed for this and he is up for that.  He states that he can still feel his shoulder being quite painful and crackling he has very limited range of motion.  He will have the MRI of his hip showed significant osteoarthritis.  Past Medical History:  Diagnosis Date  . Anxiety   . Bipolar disorder ([place])   . CTS (carpal tunnel syndrome)   . Depression   . GERD (gastroesophageal reflux disease)   . H/O bariatric surgery   . Head injury 05/06/2017  . Hypothyroidism   . Hypothyroidism   . Sleep apnea    uses CPAP nightly  . Sleep apnea    wears CPAP nightly   Relevant past medical, surgical, family and social history reviewed and updated as indicated. Interim medical history since our last visit reviewed. Allergies and medications reviewed and updated. DATA REVIEWED: CHART IN EPIC  Family History reviewed for pertinent findings.  Review of Systems  Constitutional: Negative.  Negative for appetite change and fatigue.  HENT: Negative.   Eyes: Negative.  Negative for pain and visual disturbance.  Respiratory: Negative.  Negative for cough, chest tightness, shortness of breath and wheezing.   Cardiovascular: Negative.  Negative for chest pain, palpitations and leg swelling.  Gastrointestinal: Negative.  Negative for abdominal  pain, diarrhea, nausea and vomiting.  Endocrine: Negative.   Genitourinary: Negative.   Musculoskeletal: Positive for arthralgias, back pain, gait problem and myalgias.  Skin: Negative.  Negative for color change and rash.  Neurological: Negative for weakness, numbness and headaches.  Psychiatric/Behavioral: Positive for dysphoric mood.    Allergies as of 04/29/2018      Reactions   Abilify [aripiprazole] Anaphylaxis   Seroquel [quetiapine Fumarate]    tardive dyskinesia   Alcohol Itching   Drinking alcohol   Cariprazine Other (See Comments)   Hydrocodone-acetaminophen Itching   Lurasidone Other (See Comments)   dysconesia   Victoza [liraglutide] Other (See Comments)   Severe heartburn   Victoza [liraglutide]    Severe heartburn   Vraylar [cariprazine Hcl]    Bupropion Rash   Risperidone Rash      Medication List        Accurate as of 04/29/18  3:53 PM. Always use your most recent med list.          ALPRAZolam 1 MG tablet Commonly known as:  XANAX Take 1 mg by mouth as needed.   CALCIUM 1200 PO Take by mouth.   Carbonyl Iron 15 MG Chew Chew by mouth.   levothyroxine 100 MCG tablet Commonly known as:  SYNTHROID, LEVOTHROID TAKE ONE (1) TABLET EACH DAY   lubiprostone 24 MCG capsule Commonly known as:  AMITIZA Take 1 capsule (24 mcg total) by mouth 2 (two) times daily with a meal.   multivitamin with minerals Tabs tablet Take 1 tablet by mouth 2 (two) times daily. For Vitamin supplementation   OLANZapine-FLUoxetine 6-25 MG capsule Commonly known as:  SYMBYAX olanzapine-fluoxetine 6 mg-25 mg capsule   omeprazole 40 MG capsule Commonly known as:  PRILOSEC TAKE ONE (1) CAPSULE EACH DAY   vitamin B-12 250 MCG tablet Commonly known as:  CYANOCOBALAMIN Take 250 mcg by mouth daily.          Objective:    BP 132/88   Pulse (!) 49   Temp (!) 97.2 F (36.2 C) (Oral)   Ht 5\' 10"  (1.778 m)   Wt 214 lb (97.1 kg)   BMI 30.71 kg/m    Allergies    Allergen Reactions  . Abilify [Aripiprazole] Anaphylaxis  . Seroquel [Quetiapine Fumarate]     tardive dyskinesia  . Alcohol Itching    Drinking alcohol  . Cariprazine Other (See Comments)  . Hydrocodone-Acetaminophen Itching  . Lurasidone Other (See Comments)    dysconesia  . Victoza [Liraglutide] Other (See Comments)    Severe heartburn   . Victoza [Liraglutide]     Severe heartburn  . Vraylar [Cariprazine Hcl]   . Bupropion Rash  . Risperidone Rash    Wt Readings from Last 3 Encounters:  04/29/18 214 lb (97.1 kg)  03/20/18 224 lb 9.6 oz (101.9 kg)  03/02/18 225 lb (102.1 kg)    Physical Exam  Constitutional: He appears well-developed and well-nourished.  HENT:  Head: Normocephalic and atraumatic.  Eyes: Pupils are equal, round, and reactive to light. Conjunctivae and EOM are normal.  Neck: Normal range of motion. Neck supple.  Cardiovascular: Normal rate, regular rhythm and normal heart sounds.  Pulmonary/Chest: Effort normal and breath sounds normal.  Abdominal: Soft. Bowel sounds are normal.  Musculoskeletal: Normal range of motion.  Skin: Skin is warm and dry.        Assessment & Plan:   1. Primary osteoarthritis of both shoulders - Arthritis Panel - Lupus anticoagulant panel  2. Primary osteoarthritis of both hips Rest, ibuprofen  3. DDD (degenerative disc disease), lumbar Rest ibuprofe,  4. Arthralgia, unspecified joint - Arthritis Panel - Lupus anticoagulant panel  5. Severe major depression (H[facility]) - OLANZapine-FLUoxetine (SYMBYAX) 6-25 MG capsule; olanzapine-fluoxetine 6 mg-25 mg capsule   Continue all other maintenance medications as listed above.  Follow up plan: Return in about 6 months (around 10/30/2018) for recheck.  Educational handout given for [place] PA-C [place] [address]  [place], [place] [address] [phone]   04/29/2018, 3:53 PM

## 2018-04-29 NOTE — Patient Instructions (Signed)
In a few days you may receive a survey in the mail or online from [employer] regarding your visit with us today. Please take a moment to fill this out. Your feedback is very important to our whole office. It can help us better understand your needs as well as improve your experience and satisfaction. Thank you for taking your time to complete it. We care about you.  Tywanda Rice, PA-C  

## 2018-04-30 LAB — ARTHRITIS PANEL
Basophils Absolute: 0.1 10*3/uL (ref 0.0–0.2)
Basos: 1 %
EOS (ABSOLUTE): 0.2 10*3/uL (ref 0.0–0.4)
Eos: 2 %
Hematocrit: 39.6 % (ref 37.5–51.0)
Hemoglobin: 13.3 g/dL (ref 13.0–17.7)
Immature Grans (Abs): 0 10*3/uL (ref 0.0–0.1)
Immature Granulocytes: 0 %
Lymphocytes Absolute: 4 10*3/uL — ABNORMAL HIGH (ref 0.7–3.1)
Lymphs: 41 %
MCH: 29 pg (ref 26.6–33.0)
MCHC: 33.6 g/dL (ref 31.5–35.7)
MCV: 86 fL (ref 79–97)
Monocytes Absolute: 0.5 10*3/uL (ref 0.1–0.9)
Monocytes: 5 %
Neutrophils Absolute: 5 10*3/uL (ref 1.4–7.0)
Neutrophils: 51 %
Platelets: 276 10*3/uL (ref 150–379)
RBC: 4.59 x10E6/uL (ref 4.14–5.80)
RDW: 14.1 % (ref 12.3–15.4)
Rhuematoid fact SerPl-aCnc: <10 IU/mL (ref 0.0–13.9)
Sed Rate: 2 mm/hr (ref 0–15)
Uric Acid: 4.9 mg/dL (ref 3.7–8.6)
WBC: 9.7 10*3/uL (ref 3.4–10.8)

## 2018-04-30 LAB — LUPUS ANTICOAGULANT PANEL
Dilute Viper Venom Time: 30.7 s (ref 0.0–47.0)
PTT Lupus Anticoagulant: 38.4 s (ref 0.0–51.9)

## 2018-05-14 DIAGNOSIS — Z6831 Body mass index (BMI) 31.0-31.9, adult: Secondary | ICD-10-CM | POA: Diagnosis not present

## 2018-05-14 DIAGNOSIS — K219 Gastro-esophageal reflux disease without esophagitis: Secondary | ICD-10-CM | POA: Diagnosis not present

## 2018-05-14 DIAGNOSIS — Z713 Dietary counseling and surveillance: Secondary | ICD-10-CM | POA: Diagnosis not present

## 2018-05-14 DIAGNOSIS — E669 Obesity, unspecified: Secondary | ICD-10-CM | POA: Diagnosis not present

## 2018-05-14 DIAGNOSIS — Z9884 Bariatric surgery status: Secondary | ICD-10-CM | POA: Diagnosis not present

## 2018-05-20 ENCOUNTER — Encounter: Payer: Self-pay | Admitting: Gastroenterology

## 2018-05-20 ENCOUNTER — Ambulatory Visit: Payer: BLUE CROSS/BLUE SHIELD | Admitting: Gastroenterology

## 2018-05-20 VITALS — BP 100/68 | HR 60 | Ht 70.0 in | Wt 221.2 lb

## 2018-05-20 DIAGNOSIS — K625 Hemorrhage of anus and rectum: Secondary | ICD-10-CM | POA: Diagnosis not present

## 2018-05-20 DIAGNOSIS — K5900 Constipation, unspecified: Secondary | ICD-10-CM

## 2018-05-20 MED ORDER — PEG 3350-KCL-NA BICARB-NACL 420 G PO SOLR: 4000.0000 mL | ORAL | 0 refills | Status: DC

## 2018-05-20 NOTE — Progress Notes (Signed)
HPI: This is a very pleasant 47 year old man who was referred to me by Terald Sleeper, PA-C  to evaluate constipation, minor rectal bleeding.    Chief complaint is constipation, minor rectal bleeding  He has lost about 130 pounds since his gastric bypass surgery 3 years ago  Was on some meds for psyche issues, caused some constipatoin. Even needed to used enemas periodcially.  When he stopped, changd some of those he improved.    He also started [place] 2 month bid, he has noticed a significant improvement after stopping the psych meds or changing them and also starting amities a twice daily  Lately bowels are better in past several weeks.  Still not normal.  Currently has BM every other day.   Less pushing and straining.  Has seen blood rarely from rectum; last time was two months ago.  It sounds like this is usually very small volume.  Usually associate with a lot of pushing and straining to move his bowels.   He had anal fissure surgery and colonoscopy around that time (20 years ago).    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  Cholecystectomy 2013.     Review of systems: Pertinent positive and negative review of systems were noted in the above HPI section. All other review negative.   Past Medical History:  Diagnosis Date  . Anxiety   . Bipolar disorder ([place])   . CTS (carpal tunnel syndrome)   . Depression   . GERD (gastroesophageal reflux disease)   . H/O bariatric surgery   . Head injury 05/06/2017  . Hypothyroidism   . Hypothyroidism   . Sleep apnea    uses CPAP nightly  . Sleep apnea    wears CPAP nightly    Past Surgical History:  Procedure Laterality Date  . ANAL FISSURE REPAIR    . BARIATRIC SURGERY    . CARPAL TUNNEL RELEASE Left 05/09/2015   Procedure: LEFT CARPAL TUNNEL RELEASE;  Surgeon: Daryll Brod, MD;  Location: [facility];  Service: Orthopedics;  Laterality: Left;  . CARPAL TUNNEL RELEASE  Left 05-09-2015  . CARPAL TUNNEL RELEASE Right 06/20/2015   Procedure: RIGHT CARPAL TUNNEL RELEASE;  Surgeon: Daryll Brod, MD;  Location: [facility];  Service: Orthopedics;  Laterality: Right;  REGIONAL/FAB  . CHOLECYSTECTOMY      Current Outpatient Medications  Medication Sig Dispense Refill  . ALPRAZolam (XANAX) 1 MG tablet Take 1 mg by mouth as needed.  0  . Calcium Carbonate-Vit D-Min (CALCIUM 1200 PO) Take by mouth 2 (two) times daily.     Carin Hock Iron 15 MG CHEW Chew by mouth daily.     . cyclobenzaprine (FLEXERIL) 10 MG tablet as needed.    Marland Kitchen levothyroxine (SYNTHROID, LEVOTHROID) 100 MCG tablet TAKE ONE (1) TABLET EACH DAY 90 tablet 5  . lubiprostone (AMITIZA) 24 MCG capsule Take 1 capsule (24 mcg total) by mouth 2 (two) times daily with a meal. 60 capsule 5  . Multiple Vitamin (MULTIVITAMIN WITH MINERALS) TABS tablet Take 1 tablet by mouth 2 (two) times daily. For Vitamin supplementation    . OLANZapine-FLUoxetine (SYMBYAX) 6-25 MG capsule Take by mouth at bedtime.    Marland Kitchen omeprazole (PRILOSEC) 40 MG capsule TAKE ONE (1) CAPSULE EACH DAY (Patient taking differently: 2 (two) times daily. TAKE ONE (1) CAPSULE EACH DAY) 90 capsule 3  . traMADol (ULTRAM) 50 MG tablet tramadol 50 mg tablet  TAKE 1 TABLET AT BEDTIME AS  NEEDED FOR PAIN    . vitamin B-12 (CYANOCOBALAMIN) 250 MCG tablet Take 250 mcg by mouth daily.     No current facility-administered medications for this visit.     Allergies as of 05/20/2018 - Review Complete 05/20/2018  Allergen Reaction Noted  . Abilify [aripiprazole] Anaphylaxis 05/09/2015  . Seroquel [quetiapine fumarate]  12/01/2017  . Alcohol Itching 12/02/2016  . Cariprazine Other (See Comments) 07/24/2017  . Hydrocodone-acetaminophen Itching 12/02/2016  . Lurasidone Other (See Comments) 11/14/2017  . Victoza [liraglutide] Other (See Comments) 05/09/2015  . Victoza [liraglutide]  06/13/2015  . Vraylar [cariprazine hcl]  09/02/2017  . Bupropion  Rash 11/14/2017  . Risperidone Rash 07/24/2017    Family History  Problem Relation Age of Onset  . Diabetes Mother   . Heart disease Mother   . Depression Mother   . Esophageal cancer Mother   . COPD Father   . Heart disease Father   . Kidney disease Father   . Mental illness Father   . Depression Father   . Mental illness Sister   . Depression Sister   . Esophageal cancer Maternal Uncle   . Colon cancer Neg Hx     Social History   Socioeconomic History  . Marital status: Married    Spouse name: Not on file  . Number of children: 1  . Years of education: Not on file  . Highest education level: Not on file  Occupational History  . Not on file  Social Needs  . Financial resource strain: Not on file  . Food insecurity:    Worry: Not on file    Inability: Not on file  . Transportation needs:    Medical: Not on file    Non-medical: Not on file  Tobacco Use  . Smoking status: Never Smoker  . Smokeless tobacco: Never Used  Substance and Sexual Activity  . Alcohol use: No  . Drug use: No  . Sexual activity: Yes  Lifestyle  . Physical activity:    Days per week: Not on file    Minutes per session: Not on file  . Stress: Not on file  Relationships  . Social connections:    Talks on phone: Not on file    Gets together: Not on file    Attends religious service: Not on file    Active member of club or organization: Not on file    Attends meetings of clubs or organizations: Not on file    Relationship status: Not on file  . Intimate partner violence:    Fear of current or ex partner: Not on file    Emotionally abused: Not on file    Physically abused: Not on file    Forced sexual activity: Not on file  Other Topics Concern  . Not on file  Social History Narrative   ** Merged History Encounter **         Physical Exam: BP 100/68   Pulse 60   Ht 5\' 10"  (1.778 m)   Wt 221 lb 4 oz (100.4 kg)   BMI 31.75 kg/m  Constitutional: generally  well-appearing Psychiatric: alert and oriented x3 Eyes: extraocular movements intact Mouth: oral pharynx moist, no lesions Neck: supple no lymphadenopathy Cardiovascular: heart regular rate and rhythm Lungs: clear to auscultation bilaterally Abdomen: soft, nontender, nondistended, no obvious ascites, no peritoneal signs, normal bowel sounds Extremities: no lower extremity edema bilaterally Skin: no lesions on visible extremities Rectal exam deferred for upcoming colonoscopy  Assessment and plan: 47 y.o.  male with constipation, minor rectal bleeding  His constipation seems to be improving since changing his psych meds a bit and also starting amitiza twice daily.  He is overall happy with his bowels, moves them every other day.  He did have some minor rectal bleeding intermittently when he was pretty constipated.  The last time was about a month or 2 ago.  I recommended colonoscopy to exclude significant neoplastic causes, checking for polyps.  He will continue  amitiza twice daily and focus on staying hydrated for his bowels.  I see no reason for any further blood tests or imaging studies prior to a colonoscopy.   Please see the "Patient Instructions" section for addition details about the plan.   Owens Loffler, MD [facility] 05/20/2018, 8:40 AM  Cc: Terald Sleeper, PA-C

## 2018-05-20 NOTE — Patient Instructions (Addendum)
You will be set up for a colonoscopy for constipation, bleeding.  Normal BMI (Body Mass Index- based on height and weight) is between 19 and 25. Your BMI today is Body mass index is 31.75 kg/m. Marland Kitchen Please consider follow up  regarding your BMI with your Primary Care Provider.

## 2018-05-25 DIAGNOSIS — M545 Low back pain: Secondary | ICD-10-CM | POA: Diagnosis not present

## 2018-05-25 DIAGNOSIS — M5136 Other intervertebral disc degeneration, lumbar region: Secondary | ICD-10-CM | POA: Diagnosis not present

## 2018-05-27 DIAGNOSIS — M25511 Pain in right shoulder: Secondary | ICD-10-CM | POA: Diagnosis not present

## 2018-05-27 DIAGNOSIS — M7541 Impingement syndrome of right shoulder: Secondary | ICD-10-CM | POA: Diagnosis not present

## 2018-05-27 DIAGNOSIS — M7542 Impingement syndrome of left shoulder: Secondary | ICD-10-CM | POA: Diagnosis not present

## 2018-05-27 DIAGNOSIS — M19011 Primary osteoarthritis, right shoulder: Secondary | ICD-10-CM | POA: Diagnosis not present

## 2018-05-28 DIAGNOSIS — F3111 Bipolar disorder, current episode manic without psychotic features, mild: Secondary | ICD-10-CM | POA: Diagnosis not present

## 2018-05-28 DIAGNOSIS — F3176 Bipolar disorder, in full remission, most recent episode depressed: Secondary | ICD-10-CM | POA: Diagnosis not present

## 2018-05-28 DIAGNOSIS — F410 Panic disorder [episodic paroxysmal anxiety] without agoraphobia: Secondary | ICD-10-CM | POA: Diagnosis not present

## 2018-06-05 DIAGNOSIS — M25511 Pain in right shoulder: Secondary | ICD-10-CM | POA: Diagnosis not present

## 2018-06-10 DIAGNOSIS — M7541 Impingement syndrome of right shoulder: Secondary | ICD-10-CM | POA: Diagnosis not present

## 2018-06-10 DIAGNOSIS — M19011 Primary osteoarthritis, right shoulder: Secondary | ICD-10-CM | POA: Diagnosis not present

## 2018-06-10 DIAGNOSIS — M7542 Impingement syndrome of left shoulder: Secondary | ICD-10-CM | POA: Diagnosis not present

## 2018-06-10 DIAGNOSIS — M7552 Bursitis of left shoulder: Secondary | ICD-10-CM | POA: Diagnosis not present

## 2018-06-11 DIAGNOSIS — M5136 Other intervertebral disc degeneration, lumbar region: Secondary | ICD-10-CM | POA: Diagnosis not present

## 2018-06-13 ENCOUNTER — Other Ambulatory Visit: Payer: Self-pay | Admitting: Physician Assistant

## 2018-06-23 DIAGNOSIS — M19011 Primary osteoarthritis, right shoulder: Secondary | ICD-10-CM | POA: Diagnosis not present

## 2018-06-23 DIAGNOSIS — M25511 Pain in right shoulder: Secondary | ICD-10-CM | POA: Diagnosis not present

## 2018-06-23 DIAGNOSIS — G8918 Other acute postprocedural pain: Secondary | ICD-10-CM | POA: Diagnosis not present

## 2018-06-23 DIAGNOSIS — M7541 Impingement syndrome of right shoulder: Secondary | ICD-10-CM | POA: Diagnosis not present

## 2018-06-23 DIAGNOSIS — S43431A Superior glenoid labrum lesion of right shoulder, initial encounter: Secondary | ICD-10-CM | POA: Diagnosis not present

## 2018-06-23 DIAGNOSIS — X58XXXA Exposure to other specified factors, initial encounter: Secondary | ICD-10-CM | POA: Diagnosis not present

## 2018-06-23 DIAGNOSIS — R600 Localized edema: Secondary | ICD-10-CM | POA: Diagnosis not present

## 2018-06-23 DIAGNOSIS — Y999 Unspecified external cause status: Secondary | ICD-10-CM | POA: Diagnosis not present

## 2018-06-23 DIAGNOSIS — Z4889 Encounter for other specified surgical aftercare: Secondary | ICD-10-CM | POA: Diagnosis not present

## 2018-06-24 DIAGNOSIS — R600 Localized edema: Secondary | ICD-10-CM | POA: Diagnosis not present

## 2018-06-24 DIAGNOSIS — M25511 Pain in right shoulder: Secondary | ICD-10-CM | POA: Diagnosis not present

## 2018-06-24 DIAGNOSIS — Z4889 Encounter for other specified surgical aftercare: Secondary | ICD-10-CM | POA: Diagnosis not present

## 2018-06-24 DIAGNOSIS — M19011 Primary osteoarthritis, right shoulder: Secondary | ICD-10-CM | POA: Diagnosis not present

## 2018-06-25 DIAGNOSIS — M25511 Pain in right shoulder: Secondary | ICD-10-CM | POA: Diagnosis not present

## 2018-06-25 DIAGNOSIS — R600 Localized edema: Secondary | ICD-10-CM | POA: Diagnosis not present

## 2018-06-25 DIAGNOSIS — Z4889 Encounter for other specified surgical aftercare: Secondary | ICD-10-CM | POA: Diagnosis not present

## 2018-06-25 DIAGNOSIS — M19011 Primary osteoarthritis, right shoulder: Secondary | ICD-10-CM | POA: Diagnosis not present

## 2018-06-26 DIAGNOSIS — M25511 Pain in right shoulder: Secondary | ICD-10-CM | POA: Diagnosis not present

## 2018-06-26 DIAGNOSIS — R600 Localized edema: Secondary | ICD-10-CM | POA: Diagnosis not present

## 2018-06-26 DIAGNOSIS — Z4889 Encounter for other specified surgical aftercare: Secondary | ICD-10-CM | POA: Diagnosis not present

## 2018-06-26 DIAGNOSIS — M19011 Primary osteoarthritis, right shoulder: Secondary | ICD-10-CM | POA: Diagnosis not present

## 2018-06-27 DIAGNOSIS — Z4889 Encounter for other specified surgical aftercare: Secondary | ICD-10-CM | POA: Diagnosis not present

## 2018-06-27 DIAGNOSIS — R600 Localized edema: Secondary | ICD-10-CM | POA: Diagnosis not present

## 2018-06-27 DIAGNOSIS — M25511 Pain in right shoulder: Secondary | ICD-10-CM | POA: Diagnosis not present

## 2018-06-27 DIAGNOSIS — M19011 Primary osteoarthritis, right shoulder: Secondary | ICD-10-CM | POA: Diagnosis not present

## 2018-06-28 DIAGNOSIS — M19011 Primary osteoarthritis, right shoulder: Secondary | ICD-10-CM | POA: Diagnosis not present

## 2018-06-28 DIAGNOSIS — Z4889 Encounter for other specified surgical aftercare: Secondary | ICD-10-CM | POA: Diagnosis not present

## 2018-06-28 DIAGNOSIS — M25511 Pain in right shoulder: Secondary | ICD-10-CM | POA: Diagnosis not present

## 2018-06-28 DIAGNOSIS — R600 Localized edema: Secondary | ICD-10-CM | POA: Diagnosis not present

## 2018-06-29 DIAGNOSIS — M25511 Pain in right shoulder: Secondary | ICD-10-CM | POA: Diagnosis not present

## 2018-06-29 DIAGNOSIS — Z4889 Encounter for other specified surgical aftercare: Secondary | ICD-10-CM | POA: Diagnosis not present

## 2018-06-29 DIAGNOSIS — R600 Localized edema: Secondary | ICD-10-CM | POA: Diagnosis not present

## 2018-06-29 DIAGNOSIS — M19011 Primary osteoarthritis, right shoulder: Secondary | ICD-10-CM | POA: Diagnosis not present

## 2018-06-30 ENCOUNTER — Encounter: Payer: Self-pay | Admitting: Gastroenterology

## 2018-06-30 DIAGNOSIS — Z4889 Encounter for other specified surgical aftercare: Secondary | ICD-10-CM | POA: Diagnosis not present

## 2018-06-30 DIAGNOSIS — M25511 Pain in right shoulder: Secondary | ICD-10-CM | POA: Diagnosis not present

## 2018-06-30 DIAGNOSIS — M19011 Primary osteoarthritis, right shoulder: Secondary | ICD-10-CM | POA: Diagnosis not present

## 2018-06-30 DIAGNOSIS — R600 Localized edema: Secondary | ICD-10-CM | POA: Diagnosis not present

## 2018-07-01 DIAGNOSIS — R600 Localized edema: Secondary | ICD-10-CM | POA: Diagnosis not present

## 2018-07-01 DIAGNOSIS — M25511 Pain in right shoulder: Secondary | ICD-10-CM | POA: Diagnosis not present

## 2018-07-01 DIAGNOSIS — M25512 Pain in left shoulder: Secondary | ICD-10-CM | POA: Diagnosis not present

## 2018-07-01 DIAGNOSIS — Z4889 Encounter for other specified surgical aftercare: Secondary | ICD-10-CM | POA: Diagnosis not present

## 2018-07-01 DIAGNOSIS — M19011 Primary osteoarthritis, right shoulder: Secondary | ICD-10-CM | POA: Diagnosis not present

## 2018-07-02 DIAGNOSIS — Z4889 Encounter for other specified surgical aftercare: Secondary | ICD-10-CM | POA: Diagnosis not present

## 2018-07-02 DIAGNOSIS — M19011 Primary osteoarthritis, right shoulder: Secondary | ICD-10-CM | POA: Diagnosis not present

## 2018-07-02 DIAGNOSIS — R600 Localized edema: Secondary | ICD-10-CM | POA: Diagnosis not present

## 2018-07-02 DIAGNOSIS — M25511 Pain in right shoulder: Secondary | ICD-10-CM | POA: Diagnosis not present

## 2018-07-03 DIAGNOSIS — Z4889 Encounter for other specified surgical aftercare: Secondary | ICD-10-CM | POA: Diagnosis not present

## 2018-07-03 DIAGNOSIS — M25511 Pain in right shoulder: Secondary | ICD-10-CM | POA: Diagnosis not present

## 2018-07-03 DIAGNOSIS — R600 Localized edema: Secondary | ICD-10-CM | POA: Diagnosis not present

## 2018-07-03 DIAGNOSIS — M19011 Primary osteoarthritis, right shoulder: Secondary | ICD-10-CM | POA: Diagnosis not present

## 2018-07-04 DIAGNOSIS — M25511 Pain in right shoulder: Secondary | ICD-10-CM | POA: Diagnosis not present

## 2018-07-04 DIAGNOSIS — M19011 Primary osteoarthritis, right shoulder: Secondary | ICD-10-CM | POA: Diagnosis not present

## 2018-07-04 DIAGNOSIS — R600 Localized edema: Secondary | ICD-10-CM | POA: Diagnosis not present

## 2018-07-04 DIAGNOSIS — Z4889 Encounter for other specified surgical aftercare: Secondary | ICD-10-CM | POA: Diagnosis not present

## 2018-07-05 DIAGNOSIS — M19011 Primary osteoarthritis, right shoulder: Secondary | ICD-10-CM | POA: Diagnosis not present

## 2018-07-05 DIAGNOSIS — Z4889 Encounter for other specified surgical aftercare: Secondary | ICD-10-CM | POA: Diagnosis not present

## 2018-07-05 DIAGNOSIS — R600 Localized edema: Secondary | ICD-10-CM | POA: Diagnosis not present

## 2018-07-05 DIAGNOSIS — M25511 Pain in right shoulder: Secondary | ICD-10-CM | POA: Diagnosis not present

## 2018-07-06 DIAGNOSIS — R600 Localized edema: Secondary | ICD-10-CM | POA: Diagnosis not present

## 2018-07-06 DIAGNOSIS — M25511 Pain in right shoulder: Secondary | ICD-10-CM | POA: Diagnosis not present

## 2018-07-06 DIAGNOSIS — Z4889 Encounter for other specified surgical aftercare: Secondary | ICD-10-CM | POA: Diagnosis not present

## 2018-07-06 DIAGNOSIS — M19011 Primary osteoarthritis, right shoulder: Secondary | ICD-10-CM | POA: Diagnosis not present

## 2018-07-07 DIAGNOSIS — R600 Localized edema: Secondary | ICD-10-CM | POA: Diagnosis not present

## 2018-07-07 DIAGNOSIS — M25511 Pain in right shoulder: Secondary | ICD-10-CM | POA: Diagnosis not present

## 2018-07-07 DIAGNOSIS — M19011 Primary osteoarthritis, right shoulder: Secondary | ICD-10-CM | POA: Diagnosis not present

## 2018-07-07 DIAGNOSIS — Z4889 Encounter for other specified surgical aftercare: Secondary | ICD-10-CM | POA: Diagnosis not present

## 2018-07-08 ENCOUNTER — Encounter: Payer: Self-pay | Admitting: Physical Therapy

## 2018-07-08 ENCOUNTER — Ambulatory Visit: Payer: BLUE CROSS/BLUE SHIELD | Attending: Orthopedic Surgery | Admitting: Physical Therapy

## 2018-07-08 ENCOUNTER — Other Ambulatory Visit: Payer: Self-pay

## 2018-07-08 DIAGNOSIS — R600 Localized edema: Secondary | ICD-10-CM | POA: Diagnosis not present

## 2018-07-08 DIAGNOSIS — M25511 Pain in right shoulder: Secondary | ICD-10-CM | POA: Diagnosis not present

## 2018-07-08 DIAGNOSIS — M25611 Stiffness of right shoulder, not elsewhere classified: Secondary | ICD-10-CM

## 2018-07-08 DIAGNOSIS — Z4889 Encounter for other specified surgical aftercare: Secondary | ICD-10-CM | POA: Diagnosis not present

## 2018-07-08 DIAGNOSIS — M19011 Primary osteoarthritis, right shoulder: Secondary | ICD-10-CM | POA: Diagnosis not present

## 2018-07-08 NOTE — Therapy (Addendum)
[facility] [place], [place], [address] Phone: [phone]   Fax:  [phone]  Physical Therapy Evaluation  Patient Details  Name: Marvin Espinoza MRN: [id number] Date of Birth: 06-21-1971 Referring Provider: Justice Britain MD.   Encounter Date: 07/08/2018  PT End of Session - 07/08/18 0939    Visit Number  1    Number of Visits  12    Date for PT Re-Evaluation  08/05/18    Authorization Type  FOTO AT LEAST EVERY 5TH VISIT.    PT Start Time  0857    PT Stop Time  0939    PT Time Calculation (min)  42 min    Activity Tolerance  Patient tolerated treatment well    Behavior During Therapy  [facility] for tasks assessed/performed       Past Medical History:  Diagnosis Date  . Anxiety   . Bipolar disorder ([place])   . CTS (carpal tunnel syndrome)   . Depression   . GERD (gastroesophageal reflux disease)   . H/O bariatric surgery   . Head injury 05/06/2017  . Hypothyroidism   . Hypothyroidism   . Sleep apnea    uses CPAP nightly  . Sleep apnea    wears CPAP nightly    Past Surgical History:  Procedure Laterality Date  . ANAL FISSURE REPAIR    . BARIATRIC SURGERY    . CARPAL TUNNEL RELEASE Left 05/09/2015   Procedure: LEFT CARPAL TUNNEL RELEASE;  Surgeon: Daryll Brod, MD;  Location: [facility];  Service: Orthopedics;  Laterality: Left;  . CARPAL TUNNEL RELEASE Left 05-09-2015  . CARPAL TUNNEL RELEASE Right 06/20/2015   Procedure: RIGHT CARPAL TUNNEL RELEASE;  Surgeon: Daryll Brod, MD;  Location: [facility];  Service: Orthopedics;  Laterality: Right;  REGIONAL/FAB  . CHOLECYSTECTOMY      There were no vitals filed for this visit.   Subjective Assessment - 07/08/18 1008    Subjective  The patient underwent a right shoulder SAD and DCR on 06/23/18.  He has been doing the pendulum and wall climbs at home.  His pain-level is a 8/10 today.  Cold decreases his pain and movement increases his pain.    Pertinent History  Head injury.    Patient Stated Goals  Use left arm without pain.    Currently in Pain?  Yes    Pain Score  8     Pain Location  Shoulder    Pain Orientation  Right    Pain Descriptors / Indicators  Aching;Throbbing    Pain Type  Surgical pain    Pain Onset  1 to 4 weeks ago    Pain Frequency  Constant    Aggravating Factors   See above.    Pain Relieving Factors  See above.         [facility] PT Assessment - 07/08/18 0001      Assessment   Medical Diagnosis  Impingement syndrome of right shoulder.    Referring Provider  Justice Britain MD.    Onset Date/Surgical Date  -- 06/23/18 (surgery date).      Precautions   Precautions  -- Per protocol.      Restrictions   Weight Bearing Restrictions  No      Balance Screen   Has the patient fallen in the past 6 months  No    Has the patient had a decrease in activity level because of a fear of falling?   No  Is the patient reluctant to leave their home because of a fear of falling?   No      Home Environment   Living Environment  Private residence      Prior Function   Level of Independence  Independent      Observation/Other Assessments   Focus on Therapeutic Outcomes (FOTO)   59% limitation.      Posture/Postural Control   Posture/Postural Control  -- Guarded.    Postural Limitations  --      ROM / Strength   AROM / PROM / Strength  PROM      PROM   Overall PROM Comments  PAROM to patient's right shoulder wiht flexion to 135 degrees and ER to 60 degrees.      Palpation   Palpation comment  Tender to palpation around right shoulder incisional sites.      Ambulation/Gait   Gait Comments  WNL.                Objective measurements completed on examination: See above findings.      [facility] Adult PT Treatment/Exercise - 07/08/18 0001      Modalities   Modalities  Electrical Stimulation;Vasopneumatic      Electrical Stimulation   Electrical Stimulation Location  Right shoulder.     Electrical Stimulation Action  IFC    Electrical Stimulation Parameters  80-150 Hz x 15 minutes.    Electrical Stimulation Goals  Pain      Vasopneumatic   Number Minutes Vasopneumatic   15 minutes    Vasopnuematic Location   -- Right shoulder.    Vasopneumatic Pressure  Medium Pillow between right elbow and thorax.             PT Education - 07/08/18 1100    Education Details  HEP.    Person(s) Educated  Patient    Methods  Explanation;Demonstration;Handout    Comprehension  Verbalized understanding;Returned demonstration;Need further instruction          PT Long Term Goals - 07/08/18 1051      PT LONG TERM GOAL #1   Title  Independent with a HEP.    Time  4    Period  Weeks    Status  New      PT LONG TERM GOAL #2   Title  Active right shoulder flexion to 145 degrees so the patient can easily reach overhead.    Time  4    Period  Weeks    Status  New      PT LONG TERM GOAL #3   Title  Active ER to 80 degrees+ to allow for easily donning/doffing of apparel.    Time  4    Period  Weeks    Status  New      PT LONG TERM GOAL #4   Title  Increase ROM so patient is able to reach behind back to L3.    Time  4    Period  Weeks    Status  New      PT LONG TERM GOAL #5   Title  Increase right shoulder strength to a solid 4+/5 to increase stability for performance of functional activities.    Time  4    Period  Weeks    Status  New      PT LONG TERM GOAL #6   Title  Perform ADL's with right shoulder pain not > 2/10.    Time  4  Period  Weeks    Status  New             Plan - 07/08/18 1047    Clinical Impression Statement  The patient presents to OPPT s/p right shoulder SAD and DCR.  The patient has a loss of range of motion as expected.  Current pain-level and deficits impair his functional use of his right UE.  Overall, he is doing very well thus far and he is plaesd with his surgical outcome. Patient will benefit from skilled physical therapy  intervention to address pain and deficits.     History and Personal Factors relevant to plan of care:  Head injury.  Left shoulder pain.    Clinical Presentation  Stable    Clinical Presentation due to:  Good surgicla outcome.    Clinical Decision Making  Low    Rehab Potential  Excellent    PT Frequency  3x / week    PT Duration  4 weeks    PT Treatment/Interventions  ADLs/Self Care Home Management;Cryotherapy;Electrical Stimulation;Ultrasound;Moist Heat;Therapeutic activities;Therapeutic exercise;Patient/family education;Neuromuscular re-education;Manual techniques;Passive range of motion;Vasopneumatic Device    PT Next Visit Plan  Please begin with PROM to patient's right shoulder to restore ROM then progress to pulleys and UE ranger.  Please review supine cane exercises and show standing passive counter stretch to increase flexion.  Eventual progression to PRE's.  Vasopneumatic and electrical stimulation.    Consulted and Agree with Plan of Care  Patient       Patient will benefit from skilled therapeutic intervention in order to improve the following deficits and impairments:  Decreased activity tolerance, Pain, Decreased range of motion  Visit Diagnosis: Acute pain of right shoulder - Plan: PT plan of care cert/re-cert  Stiffness of right shoulder, not elsewhere classified - Plan: PT plan of care cert/re-cert     Problem List Patient Active Problem List   Diagnosis Date Noted  . Bilateral hip pain 10/24/2017  . Shoulder pain, bilateral 10/24/2017  . Arthritis 10/24/2017  . Osteoarthritis of both hips 10/24/2017  . Avascular necrosis of bone of hip, right ([place]) 10/24/2017  . 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 PTSD (post-traumatic stress disorder) 08/20/2017  . Tardive dyskinesia 08/20/2017  . Severe major depression ([place]) 05/10/2017  . Affective psychosis, bipolar ([place]) 05/10/2017  . Vision disturbance 05/06/2017  . History of concussion 02/10/2017  . Mood disorder  ([place]) 02/10/2017  . Adjustment insomnia 01/24/2017  . Adjustment disorder with anxious mood 01/24/2017  . GAD (generalized anxiety disorder) 01/24/2017  . Gastroesophageal reflux disease without esophagitis 11/19/2016  . Hypothyroidism 11/19/2016  . DDD (degenerative disc disease), lumbar 11/19/2016    Karsyn Jamie, [place] MPT 07/08/2018, 12:15 PM  [facility] [address] [place], [place], [address] Phone: [phone]   Fax:  [phone]  Name: Deagan Sevin MRN: [id number] Date of Birth: May 12, 1971

## 2018-07-09 ENCOUNTER — Encounter: Payer: Self-pay | Admitting: Physical Therapy

## 2018-07-09 ENCOUNTER — Ambulatory Visit: Payer: BLUE CROSS/BLUE SHIELD | Admitting: Physical Therapy

## 2018-07-09 DIAGNOSIS — R600 Localized edema: Secondary | ICD-10-CM | POA: Diagnosis not present

## 2018-07-09 DIAGNOSIS — Z4889 Encounter for other specified surgical aftercare: Secondary | ICD-10-CM | POA: Diagnosis not present

## 2018-07-09 DIAGNOSIS — M25611 Stiffness of right shoulder, not elsewhere classified: Secondary | ICD-10-CM | POA: Diagnosis not present

## 2018-07-09 DIAGNOSIS — M25511 Pain in right shoulder: Secondary | ICD-10-CM | POA: Diagnosis not present

## 2018-07-09 DIAGNOSIS — M19011 Primary osteoarthritis, right shoulder: Secondary | ICD-10-CM | POA: Diagnosis not present

## 2018-07-09 NOTE — Therapy (Signed)
[facility] [place], [place], [address] Phone: [phone]   Fax:  [phone]  Physical Therapy Treatment  Patient Details  Name: Marvin Espinoza MRN: [id number] Date of Birth: June 02, 1971 Referring Provider: Justice Britain MD.   Encounter Date: 07/09/2018  PT End of Session - 07/09/18 0732    Visit Number  2    Number of Visits  12    Date for PT Re-Evaluation  08/05/18    Authorization Type  FOTO AT LEAST EVERY 5TH VISIT.    PT Start Time  0732    PT Stop Time  0820    PT Time Calculation (min)  48 min    Activity Tolerance  Patient tolerated treatment well    Behavior During Therapy  WFL for tasks assessed/performed       Past Medical History:  Diagnosis Date  . Anxiety   . Bipolar disorder ([place])   . CTS (carpal tunnel syndrome)   . Depression   . GERD (gastroesophageal reflux disease)   . H/O bariatric surgery   . Head injury 05/06/2017  . Hypothyroidism   . Hypothyroidism   . Sleep apnea    uses CPAP nightly  . Sleep apnea    wears CPAP nightly    Past Surgical History:  Procedure Laterality Date  . ANAL FISSURE REPAIR    . BARIATRIC SURGERY    . CARPAL TUNNEL RELEASE Left 05/09/2015   Procedure: LEFT CARPAL TUNNEL RELEASE;  Surgeon: Daryll Brod, MD;  Location: [facility];  Service: Orthopedics;  Laterality: Left;  . CARPAL TUNNEL RELEASE Left 05-09-2015  . CARPAL TUNNEL RELEASE Right 06/20/2015   Procedure: RIGHT CARPAL TUNNEL RELEASE;  Surgeon: Daryll Brod, MD;  Location: [facility];  Service: Orthopedics;  Laterality: Right;  REGIONAL/FAB  . CHOLECYSTECTOMY      There were no vitals filed for this visit.  Subjective Assessment - 07/09/18 0730    Subjective  Reports his shoulder is about the same.    Pertinent History  Head injury.    Patient Stated Goals  Use left arm without pain.    Currently in Pain?  Yes    Pain Score  6     Pain Location  Shoulder    Pain  Orientation  Right    Pain Descriptors / Indicators  Sore    Pain Type  Surgical pain    Pain Onset  1 to 4 weeks ago         [facility] PT Assessment - 07/09/18 0001      Assessment   Medical Diagnosis  Impingement syndrome of right shoulder.    Onset Date/Surgical Date  06/23/18    Next MD Visit  07/2018                   [facility] Adult PT Treatment/Exercise - 07/09/18 0001      Exercises   Exercises  Shoulder      Modalities   Modalities  Electrical Stimulation;Vasopneumatic      Electrical Stimulation   Electrical Stimulation Location  R shoulder    Electrical Stimulation Action  IFC    Electrical Stimulation Parameters  80-150 hz x15 min    Electrical Stimulation Goals  Pain      Vasopneumatic   Number Minutes Vasopneumatic   15 minutes    Vasopnuematic Location   Shoulder    Vasopneumatic Pressure  Low    Vasopneumatic Temperature   34  Manual Therapy   Manual Therapy  Passive ROM    Passive ROM  PROM of R shoulder into flexion, ER, IR with oscillations and gentle holds at end range             PT Education - 07/08/18 1100    Education Details  HEP.    Person(s) Educated  Patient    Methods  Explanation;Demonstration;Handout    Comprehension  Verbalized understanding;Returned demonstration;Need further instruction          PT Long Term Goals - 07/09/18 0831      PT LONG TERM GOAL #1   Title  Independent with a HEP.    Time  4    Period  Weeks    Status  Achieved      PT LONG TERM GOAL #2   Title  Active right shoulder flexion to 145 degrees so the patient can easily reach overhead.    Time  4    Period  Weeks    Status  On-going      PT LONG TERM GOAL #3   Title  Active ER to 80 degrees+ to allow for easily donning/doffing of apparel.    Time  4    Period  Weeks    Status  On-going      PT LONG TERM GOAL #4   Title  Increase ROM so patient is able to reach behind back to L3.    Time  4    Period  Weeks    Status  On-going       PT LONG TERM GOAL #5   Title  Increase right shoulder strength to a solid 4+/5 to increase stability for performance of functional activities.    Time  4    Period  Weeks    Status  On-going      PT LONG TERM GOAL #6   Title  Perform ADL's with right shoulder pain not > 2/10.    Time  4    Period  Weeks    Status  On-going            Plan - 07/09/18 0813    Clinical Impression Statement  Patient tolerated today's treatment well with 6/10 R shoulder pain upon arrival. Patient able to tolerate PROM of R shoulder into flex, ER, IR with gentle oscillations and gentle holds. Smooth arc of motion with firm end feels noted throughout the PROM session. Only complaints of soreness with PROM of R shoulder. Normal modalities response noted following removal of the modalities. Patient reported compliance with HEP and patient encouraged to continue HEP and to still be cautious with RUE.     Rehab Potential  Excellent    PT Frequency  3x / week    PT Duration  4 weeks    PT Treatment/Interventions  ADLs/Self Care Home Management;Cryotherapy;Electrical Stimulation;Ultrasound;Moist Heat;Therapeutic activities;Therapeutic exercise;Patient/family education;Neuromuscular re-education;Manual techniques;Passive range of motion;Vasopneumatic Device    PT Next Visit Plan  Please begin with PROM to patient's right shoulder to restore ROM then progress to pulleys and UE ranger.  Please review supine cane exercises and show standing passive counter stretch to increase flexion.  Eventual progression to PRE's.  Vasopneumatic and electrical stimulation.    Consulted and Agree with Plan of Care  Patient       Patient will benefit from skilled therapeutic intervention in order to improve the following deficits and impairments:  Decreased activity tolerance, Pain, Decreased range of motion  Visit Diagnosis: Acute pain  of right shoulder  Stiffness of right shoulder, not elsewhere classified     Problem  List Patient Active Problem List   Diagnosis Date Noted  . Bilateral hip pain 10/24/2017  . Shoulder pain, bilateral 10/24/2017  . Arthritis 10/24/2017  . Osteoarthritis of both hips 10/24/2017  . Avascular necrosis of bone of hip, right ([place]) 10/24/2017  . 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 PTSD (post-traumatic stress disorder) 08/20/2017  . Tardive dyskinesia 08/20/2017  . Severe major depression ([place]) 05/10/2017  . Affective psychosis, bipolar ([place]) 05/10/2017  . Vision disturbance 05/06/2017  . History of concussion 02/10/2017  . Mood disorder ([place]) 02/10/2017  . Adjustment insomnia 01/24/2017  . Adjustment disorder with anxious mood 01/24/2017  . GAD (generalized anxiety disorder) 01/24/2017  . Gastroesophageal reflux disease without esophagitis 11/19/2016  . Hypothyroidism 11/19/2016  . DDD (degenerative disc disease), lumbar 11/19/2016    Standley Brooking, PTA 07/09/2018, 8:32 AM  [facility] [place], [place], [address] Phone: [phone]   Fax:  [phone]  Name: Marvin Espinoza MRN: [id number] Date of Birth: 25-Jul-1971

## 2018-07-10 DIAGNOSIS — R600 Localized edema: Secondary | ICD-10-CM | POA: Diagnosis not present

## 2018-07-10 DIAGNOSIS — M19011 Primary osteoarthritis, right shoulder: Secondary | ICD-10-CM | POA: Diagnosis not present

## 2018-07-10 DIAGNOSIS — Z4889 Encounter for other specified surgical aftercare: Secondary | ICD-10-CM | POA: Diagnosis not present

## 2018-07-10 DIAGNOSIS — M25511 Pain in right shoulder: Secondary | ICD-10-CM | POA: Diagnosis not present

## 2018-07-11 DIAGNOSIS — M25511 Pain in right shoulder: Secondary | ICD-10-CM | POA: Diagnosis not present

## 2018-07-11 DIAGNOSIS — R600 Localized edema: Secondary | ICD-10-CM | POA: Diagnosis not present

## 2018-07-11 DIAGNOSIS — M19011 Primary osteoarthritis, right shoulder: Secondary | ICD-10-CM | POA: Diagnosis not present

## 2018-07-11 DIAGNOSIS — Z4889 Encounter for other specified surgical aftercare: Secondary | ICD-10-CM | POA: Diagnosis not present

## 2018-07-12 DIAGNOSIS — R600 Localized edema: Secondary | ICD-10-CM | POA: Diagnosis not present

## 2018-07-12 DIAGNOSIS — M25511 Pain in right shoulder: Secondary | ICD-10-CM | POA: Diagnosis not present

## 2018-07-12 DIAGNOSIS — Z4889 Encounter for other specified surgical aftercare: Secondary | ICD-10-CM | POA: Diagnosis not present

## 2018-07-12 DIAGNOSIS — M19011 Primary osteoarthritis, right shoulder: Secondary | ICD-10-CM | POA: Diagnosis not present

## 2018-07-13 ENCOUNTER — Ambulatory Visit: Payer: BLUE CROSS/BLUE SHIELD | Admitting: Physical Therapy

## 2018-07-13 ENCOUNTER — Encounter: Payer: Self-pay | Admitting: Physical Therapy

## 2018-07-13 DIAGNOSIS — M25611 Stiffness of right shoulder, not elsewhere classified: Secondary | ICD-10-CM | POA: Diagnosis not present

## 2018-07-13 DIAGNOSIS — M25511 Pain in right shoulder: Secondary | ICD-10-CM

## 2018-07-13 DIAGNOSIS — Z4889 Encounter for other specified surgical aftercare: Secondary | ICD-10-CM | POA: Diagnosis not present

## 2018-07-13 DIAGNOSIS — M19011 Primary osteoarthritis, right shoulder: Secondary | ICD-10-CM | POA: Diagnosis not present

## 2018-07-13 DIAGNOSIS — R600 Localized edema: Secondary | ICD-10-CM | POA: Diagnosis not present

## 2018-07-13 NOTE — Therapy (Signed)
[facility] [place], [place], [address] Phone: [phone]   Fax:  [phone]  Physical Therapy Treatment  Patient Details  Name: Audi Wettstein MRN: [id number] Date of Birth: 01-02-1971 Referring Provider: Justice Britain MD.   Encounter Date: 07/13/2018  PT End of Session - 07/13/18 0742    Visit Number  3    Number of Visits  12    Date for PT Re-Evaluation  08/05/18    Authorization Type  FOTO AT LEAST EVERY 5TH VISIT.    PT Start Time  [phone]    PT Stop Time  0828    PT Time Calculation (min)  55 min    Activity Tolerance  Patient tolerated treatment well    Behavior During Therapy  [facility] for tasks assessed/performed       Past Medical History:  Diagnosis Date  . Anxiety   . Bipolar disorder ([place])   . CTS (carpal tunnel syndrome)   . Depression   . GERD (gastroesophageal reflux disease)   . H/O bariatric surgery   . Head injury 05/06/2017  . Hypothyroidism   . Hypothyroidism   . Sleep apnea    uses CPAP nightly  . Sleep apnea    wears CPAP nightly    Past Surgical History:  Procedure Laterality Date  . ANAL FISSURE REPAIR    . BARIATRIC SURGERY    . CARPAL TUNNEL RELEASE Left 05/09/2015   Procedure: LEFT CARPAL TUNNEL RELEASE;  Surgeon: Daryll Brod, MD;  Location: [facility];  Service: Orthopedics;  Laterality: Left;  . CARPAL TUNNEL RELEASE Left 05-09-2015  . CARPAL TUNNEL RELEASE Right 06/20/2015   Procedure: RIGHT CARPAL TUNNEL RELEASE;  Surgeon: Daryll Brod, MD;  Location: [facility];  Service: Orthopedics;  Laterality: Right;  REGIONAL/FAB  . CHOLECYSTECTOMY      There were no vitals filed for this visit.  Subjective Assessment - 07/13/18 0737    Subjective  Reports that he does a lot of overhead and work at his feet. Reports that he has a colonoscopy tomorrow so he doesn't know how he'll feel Wednesday.    Pertinent History  Head injury.    Patient Stated Goals  Use left  arm without pain.    Currently in Pain?  Yes    Pain Score  4     Pain Location  Shoulder    Pain Orientation  Right    Pain Descriptors / Indicators  Sore    Pain Type  Surgical pain    Pain Onset  1 to 4 weeks ago    Pain Frequency  Intermittent         [facility] PT Assessment - 07/13/18 0001      Assessment   Medical Diagnosis  Impingement syndrome of right shoulder.    Onset Date/Surgical Date  06/23/18    Next MD Visit  07/2018      Restrictions   Weight Bearing Restrictions  No                   [facility] Adult PT Treatment/Exercise - 07/13/18 0001      Shoulder Exercises: Supine   Protraction  AAROM;Both;20 reps    External Rotation  AAROM;Right;20 reps    Flexion  AAROM;Both;20 reps      Shoulder Exercises: Seated   Other Seated Exercises  RUE ranger seated flexion, CW and CCW       Shoulder Exercises: Pulleys   Flexion  Other (comment)  x6 min      Modalities   Modalities  Psychologist, educational Location  R shoulder    Electrical Stimulation Action  IFC    Electrical Stimulation Parameters  80-150 hz x15 min    Electrical Stimulation Goals  Pain      Vasopneumatic   Number Minutes Vasopneumatic   15 minutes    Vasopnuematic Location   Shoulder    Vasopneumatic Pressure  Low    Vasopneumatic Temperature   34      Manual Therapy   Manual Therapy  Passive ROM    Passive ROM  PROM of R shoulder into flexion, ER, IR with oscillations and gentle holds at end range                  PT Long Term Goals - 07/09/18 0831      PT LONG TERM GOAL #1   Title  Independent with a HEP.    Time  4    Period  Weeks    Status  Achieved      PT LONG TERM GOAL #2   Title  Active right shoulder flexion to 145 degrees so the patient can easily reach overhead.    Time  4    Period  Weeks    Status  On-going      PT LONG TERM GOAL #3   Title  Active ER to 80 degrees+ to allow for  easily donning/doffing of apparel.    Time  4    Period  Weeks    Status  On-going      PT LONG TERM GOAL #4   Title  Increase ROM so patient is able to reach behind back to L3.    Time  4    Period  Weeks    Status  On-going      PT LONG TERM GOAL #5   Title  Increase right shoulder strength to a solid 4+/5 to increase stability for performance of functional activities.    Time  4    Period  Weeks    Status  On-going      PT LONG TERM GOAL #6   Title  Perform ADL's with right shoulder pain not > 2/10.    Time  4    Period  Weeks    Status  On-going            Plan - 07/13/18 0813    Clinical Impression Statement  Patient able to progress to R shoulder AAROM exercises with VCs to complete exercises in comfortable range. No pain complaints during today's treatment. Good range noted with AAROM exercises today as well in supine or seated. Firm end feels and smooth arc of motion noted with PROM of R shoulder in all directions assessed. Intermittant oscillations provided as to reduce guarding and discomfort. Normal modalities response noted following removal of the modalities.    Rehab Potential  Excellent    PT Frequency  3x / week    PT Duration  4 weeks    PT Treatment/Interventions  ADLs/Self Care Home Management;Cryotherapy;Electrical Stimulation;Ultrasound;Moist Heat;Therapeutic activities;Therapeutic exercise;Patient/family education;Neuromuscular re-education;Manual techniques;Passive range of motion;Vasopneumatic Device    PT Next Visit Plan  Please begin with PROM to patient's right shoulder to restore ROM then progress to pulleys and UE ranger.  Please review supine cane exercises and show standing passive counter stretch to increase flexion.  Eventual progression to PRE's.  Vasopneumatic and electrical stimulation.    Consulted and Agree with Plan of Care  Patient       Patient will benefit from skilled therapeutic intervention in order to improve the following  deficits and impairments:  Decreased activity tolerance, Pain, Decreased range of motion  Visit Diagnosis: Acute pain of right shoulder  Stiffness of right shoulder, not elsewhere classified     Problem List Patient Active Problem List   Diagnosis Date Noted  . Bilateral hip pain 10/24/2017  . Shoulder pain, bilateral 10/24/2017  . Arthritis 10/24/2017  . Osteoarthritis of both hips 10/24/2017  . Avascular necrosis of bone of hip, right ([place]) 10/24/2017  . 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 PTSD (post-traumatic stress disorder) 08/20/2017  . Tardive dyskinesia 08/20/2017  . Severe major depression ([place]) 05/10/2017  . Affective psychosis, bipolar ([place]) 05/10/2017  . Vision disturbance 05/06/2017  . History of concussion 02/10/2017  . Mood disorder ([place]) 02/10/2017  . Adjustment insomnia 01/24/2017  . Adjustment disorder with anxious mood 01/24/2017  . GAD (generalized anxiety disorder) 01/24/2017  . Gastroesophageal reflux disease without esophagitis 11/19/2016  . Hypothyroidism 11/19/2016  . DDD (degenerative disc disease), lumbar 11/19/2016    Standley Brooking, PTA 07/13/2018, 8:40 AM  [facility] [place], [place], [address] Phone: [phone]   Fax:  [phone]  Name: Justis Closser MRN: [id number] Date of Birth: 1971-07-18

## 2018-07-14 ENCOUNTER — Ambulatory Visit: Payer: BLUE CROSS/BLUE SHIELD | Admitting: Gastroenterology

## 2018-07-14 ENCOUNTER — Ambulatory Visit (AMBULATORY_SURGERY_CENTER): Payer: BLUE CROSS/BLUE SHIELD | Admitting: Gastroenterology

## 2018-07-14 ENCOUNTER — Encounter

## 2018-07-14 ENCOUNTER — Encounter: Payer: Self-pay | Admitting: Gastroenterology

## 2018-07-14 VITALS — BP 126/88 | HR 72 | Temp 98.6°F | Ht 70.0 in | Wt 221.0 lb

## 2018-07-14 VITALS — BP 120/70 | HR 69 | Resp 13

## 2018-07-14 DIAGNOSIS — K621 Rectal polyp: Secondary | ICD-10-CM

## 2018-07-14 DIAGNOSIS — K649 Unspecified hemorrhoids: Secondary | ICD-10-CM | POA: Diagnosis not present

## 2018-07-14 DIAGNOSIS — K625 Hemorrhage of anus and rectum: Secondary | ICD-10-CM

## 2018-07-14 DIAGNOSIS — D128 Benign neoplasm of rectum: Secondary | ICD-10-CM

## 2018-07-14 DIAGNOSIS — M19011 Primary osteoarthritis, right shoulder: Secondary | ICD-10-CM | POA: Diagnosis not present

## 2018-07-14 DIAGNOSIS — K5900 Constipation, unspecified: Secondary | ICD-10-CM

## 2018-07-14 DIAGNOSIS — M25511 Pain in right shoulder: Secondary | ICD-10-CM | POA: Diagnosis not present

## 2018-07-14 DIAGNOSIS — R600 Localized edema: Secondary | ICD-10-CM | POA: Diagnosis not present

## 2018-07-14 DIAGNOSIS — Z4889 Encounter for other specified surgical aftercare: Secondary | ICD-10-CM | POA: Diagnosis not present

## 2018-07-14 DIAGNOSIS — D129 Benign neoplasm of anus and anal canal: Principal | ICD-10-CM

## 2018-07-14 MED ORDER — SODIUM CHLORIDE 0.9 % IV SOLN: 500.0000 mL | Freq: Once | INTRAVENOUS | Status: DC

## 2018-07-14 NOTE — Progress Notes (Signed)
Report given to PACU, vss 

## 2018-07-14 NOTE — Op Note (Signed)
[place] Patient Name: Marvin Espinoza Procedure Date: 07/14/2018 3:35 PM MRN: [id number] Endoscopist: Milus Banister , MD Age: 47 Referring MD:  Date of Birth: [birth year] Gender: Male Account #: [id number] Procedure:                Colonoscopy Indications:              Rectal bleeding, Constipation Medicines:                Monitored Anesthesia Care Procedure:                Pre-Anesthesia Assessment:                           - Prior to the procedure, a History and Physical                            was performed, and patient medications and                            allergies were reviewed. The patient's tolerance of                            previous anesthesia was also reviewed. The risks                            and benefits of the procedure and the sedation                            options and risks were discussed with the patient.                            All questions were answered, and informed consent                            was obtained. Prior Anticoagulants: The patient has                            taken no previous anticoagulant or antiplatelet                            agents. ASA Grade Assessment: II - A patient with                            mild systemic disease. After reviewing the risks                            and benefits, the patient was deemed in                            satisfactory condition to undergo the procedure.                           After obtaining informed consent, the colonoscope  was passed under direct vision. Throughout the                            procedure, the patient's blood pressure, pulse, and                            oxygen saturations were monitored continuously. The                            Model CF-HQ190L [id number]) scope was introduced                            through the anus and advanced to the the cecum,                            identified by appendiceal orifice  and ileocecal                            valve. The colonoscopy was performed without                            difficulty. The patient tolerated the procedure                            well. The quality of the bowel preparation was                            good. The ileocecal valve, appendiceal orifice, and                            rectum were photographed. Scope In: 3:51:19 PM Scope Out: 4:10:10 PM Scope Withdrawal Time: 0 hours 8 minutes 48 seconds  Total Procedure Duration: 0 hours 18 minutes 51 seconds  Findings:                 Two sessile polyps were found in the rectum. The                            polyps were 1 to 2 mm in size. These polyps were                            removed with a cold snare. Resection and retrieval                            were complete.                           Internal hemorrhoids were found. The hemorrhoids                            were small.                           The exam was otherwise without abnormality on  direct and retroflexion views. Complications:            No immediate complications. Estimated blood loss:                            None. Estimated Blood Loss:     Estimated blood loss: none. Impression:               - Two 1 to 2 mm polyps in the rectum, removed with                            a cold snare. Resected and retrieved.                           - Internal hemorrhoids.                           - The examination was otherwise normal on direct                            and retroflexion views. Recommendation:           - Patient has a contact number available for                            emergencies. The signs and symptoms of potential                            delayed complications were discussed with the                            patient. Return to normal activities tomorrow.                            Written discharge instructions were provided to the                             patient.                           - Resume previous diet.                           - Continue present medications. The twice daily                            Amitiza seems to be helping.                           You will receive a letter within 2-3 weeks with the                            pathology results and my final recommendations.                           If the polyp(s) is proven to be 'pre-cancerous' on  pathology, you will need repeat colonoscopy in 5                            years. If the polyp(s) is NOT 'precancerous' on                            pathology then you should repeat colon cancer                            screening in 10 years with colonoscopy without need                            for colon cancer screening by any method prior to                            then (including stool testing). Milus Banister, MD 07/14/2018 4:12:40 PM This report has been signed electronically.

## 2018-07-14 NOTE — Progress Notes (Signed)
Called to room to assist during endoscopic procedure.  Patient ID and intended procedure confirmed with present staff. Received instructions for my participation in the procedure from the performing physician.  

## 2018-07-14 NOTE — Patient Instructions (Signed)
YOU HAD AN ENDOSCOPIC PROCEDURE TODAY AT THE [facility]:   Refer to the procedure report that was given to you for any specific questions about what was found during the examination.  If the procedure report does not answer your questions, please call your gastroenterologist to clarify.  If you requested that your care partner not be given the details of your procedure findings, then the procedure report has been included in a sealed envelope for you to review at your convenience later.  YOU SHOULD EXPECT: Some feelings of bloating in the abdomen. Passage of more gas than usual.  Walking can help get rid of the air that was put into your GI tract during the procedure and reduce the bloating. If you had a lower endoscopy (such as a colonoscopy or flexible sigmoidoscopy) you may notice spotting of blood in your stool or on the toilet paper. If you underwent a bowel prep for your procedure, you may not have a normal bowel movement for a few days.  Please Note:  You might notice some irritation and congestion in your nose or some drainage.  This is from the oxygen used during your procedure.  There is no need for concern and it should clear up in a day or so.  SYMPTOMS TO REPORT IMMEDIATELY:   Following lower endoscopy (colonoscopy or flexible sigmoidoscopy):  Excessive amounts of blood in the stool  Significant tenderness or worsening of abdominal pains  Swelling of the abdomen that is new, acute  Fever of 100F or higher  Please see handouts given to you on Polyps and Hemorrhoids.  For urgent or emergent issues, a gastroenterologist can be reached at any hour by calling [phone].   DIET:  We do recommend a small meal at first, but then you may proceed to your regular diet.  Drink plenty of fluids but you should avoid alcoholic beverages for 24 hours.  ACTIVITY:  You should plan to take it easy for the rest of today and you should NOT DRIVE or use heavy machinery until tomorrow  (because of the sedation medicines used during the test).    FOLLOW UP: Our staff will call the number listed on your records the next business day following your procedure to check on you and address any questions or concerns that you may have regarding the information given to you following your procedure. If we do not reach you, we will leave a message.  However, if you are feeling well and you are not experiencing any problems, there is no need to return our call.  We will assume that you have returned to your regular daily activities without incident.  If any biopsies were taken you will be contacted by phone or by letter within the next 1-3 weeks.  Please call us at [phone] if you have not heard about the biopsies in 3 weeks.    SIGNATURES/CONFIDENTIALITY: You and/or your care partner have signed paperwork which will be entered into your electronic medical record.  These signatures attest to the fact that that the information above on your After Visit Summary has been reviewed and is understood.  Full responsibility of the confidentiality of this discharge information lies with you and/or your care-partner.  Thank you for letting us take care of your healthcare needs today. 

## 2018-07-15 ENCOUNTER — Ambulatory Visit: Payer: BLUE CROSS/BLUE SHIELD | Admitting: Physical Therapy

## 2018-07-15 ENCOUNTER — Telehealth: Payer: Self-pay | Admitting: *Deleted

## 2018-07-15 DIAGNOSIS — M25511 Pain in right shoulder: Secondary | ICD-10-CM | POA: Diagnosis not present

## 2018-07-15 DIAGNOSIS — Z4889 Encounter for other specified surgical aftercare: Secondary | ICD-10-CM | POA: Diagnosis not present

## 2018-07-15 DIAGNOSIS — R600 Localized edema: Secondary | ICD-10-CM | POA: Diagnosis not present

## 2018-07-15 DIAGNOSIS — M19011 Primary osteoarthritis, right shoulder: Secondary | ICD-10-CM | POA: Diagnosis not present

## 2018-07-15 NOTE — Telephone Encounter (Signed)
  Follow up Call-  Call back number 07/14/2018  Post procedure Call Back phone  # [phone]  Permission to leave phone message Yes  Some recent data might be hidden     Patient questions:  Do you have a fever, pain , or abdominal swelling? No. Pain Score  0 *  Have you tolerated food without any problems? Yes.    Have you been able to return to your normal activities? Yes.    Do you have any questions about your discharge instructions: Diet   No. Medications  No. Follow up visit  No.  Do you have questions or concerns about your Care? No.  Actions: * If pain score is 4 or above: No action needed, pain <4.

## 2018-07-16 ENCOUNTER — Encounter: Payer: Self-pay | Admitting: Physical Therapy

## 2018-07-16 ENCOUNTER — Ambulatory Visit: Payer: BLUE CROSS/BLUE SHIELD | Attending: Orthopedic Surgery | Admitting: Physical Therapy

## 2018-07-16 DIAGNOSIS — M25511 Pain in right shoulder: Secondary | ICD-10-CM | POA: Diagnosis not present

## 2018-07-16 DIAGNOSIS — M25611 Stiffness of right shoulder, not elsewhere classified: Secondary | ICD-10-CM | POA: Diagnosis not present

## 2018-07-16 NOTE — Therapy (Signed)
[facility] [place], [place], [address] Phone: [phone]   Fax:  [phone]  Physical Therapy Treatment  Patient Details  Name: Marvin Espinoza MRN: [id number] Date of Birth: 09/24/1971 Referring Provider: Justice Britain MD.   Encounter Date: 07/16/2018  PT End of Session - 07/16/18 0740    Visit Number  4    Number of Visits  12    Date for PT Re-Evaluation  08/05/18    Authorization Type  FOTO AT LEAST EVERY 5TH VISIT.    PT Start Time  [phone]    PT Stop Time  0827    PT Time Calculation (min)  52 min    Activity Tolerance  Patient tolerated treatment well    Behavior During Therapy  [facility] for tasks assessed/performed       Past Medical History:  Diagnosis Date  . Anxiety   . Bipolar disorder ([place])   . CTS (carpal tunnel syndrome)   . Depression   . GERD (gastroesophageal reflux disease)   . H/O bariatric surgery   . Head injury 05/06/2017  . Hypothyroidism   . Hypothyroidism   . Sleep apnea    no need for CPAP at this time  . Sleep apnea     Past Surgical History:  Procedure Laterality Date  . ANAL FISSURE REPAIR    . BARIATRIC SURGERY    . CARPAL TUNNEL RELEASE Left 05/09/2015   Procedure: LEFT CARPAL TUNNEL RELEASE;  Surgeon: Daryll Brod, MD;  Location: [facility];  Service: Orthopedics;  Laterality: Left;  . CARPAL TUNNEL RELEASE Left 05-09-2015  . CARPAL TUNNEL RELEASE Right 06/20/2015   Procedure: RIGHT CARPAL TUNNEL RELEASE;  Surgeon: Daryll Brod, MD;  Location: [facility];  Service: Orthopedics;  Laterality: Right;  REGIONAL/FAB  . CHOLECYSTECTOMY    . COLONOSCOPY    . UPPER GASTROINTESTINAL ENDOSCOPY      There were no vitals filed for this visit.  Subjective Assessment - 07/16/18 0737    Subjective  Reports that following his colonoscopy he went to pick up case of water with BUE and put into cart and felt a pull from R shoulder into R deltoids. Reports that was soon after  he left a colonoscopy and wasn't thinking. Reports that yesterday he left the ice machine on his shoulder all day.    Pertinent History  Head injury.    Patient Stated Goals  Use left arm without pain.    Currently in Pain?  Yes    Pain Score  8     Pain Location  Shoulder    Pain Orientation  Right    Pain Descriptors / Indicators  Aching;Sore    Pain Type  Surgical pain    Pain Onset  1 to 4 weeks ago         [facility] PT Assessment - 07/16/18 0001      Assessment   Medical Diagnosis  Impingement syndrome of right shoulder.    Onset Date/Surgical Date  06/23/18    Next MD Visit  07/2018      Restrictions   Weight Bearing Restrictions  No                   [facility] Adult PT Treatment/Exercise - 07/16/18 0001      Shoulder Exercises: Supine   Protraction  AAROM;Both;20 reps    External Rotation  AAROM;Right;20 reps    Flexion  AAROM;Both;20 reps      Shoulder  Exercises: Seated   Other Seated Exercises  RUE ranger seated flexion, CW and CCW x20 reps      Shoulder Exercises: Pulleys   Flexion  5 minutes      Modalities   Modalities  Electrical Stimulation;Vasopneumatic      Electrical Stimulation   Electrical Stimulation Location  R shoulder    Electrical Stimulation Action  IFC    Electrical Stimulation Parameters  80-150 hz x15 min    Electrical Stimulation Goals  Pain      Vasopneumatic   Number Minutes Vasopneumatic   15 minutes    Vasopnuematic Location   Shoulder    Vasopneumatic Pressure  Low    Vasopneumatic Temperature   34      Manual Therapy   Manual Therapy  Passive ROM    Passive ROM  PROM of R shoulder into flexion, ER, IR with oscillations and gentle holds at end range                  PT Long Term Goals - 07/09/18 0831      PT LONG TERM GOAL #1   Title  Independent with a HEP.    Time  4    Period  Weeks    Status  Achieved      PT LONG TERM GOAL #2   Title  Active right shoulder flexion to 145 degrees so the patient can  easily reach overhead.    Time  4    Period  Weeks    Status  On-going      PT LONG TERM GOAL #3   Title  Active ER to 80 degrees+ to allow for easily donning/doffing of apparel.    Time  4    Period  Weeks    Status  On-going      PT LONG TERM GOAL #4   Title  Increase ROM so patient is able to reach behind back to L3.    Time  4    Period  Weeks    Status  On-going      PT LONG TERM GOAL #5   Title  Increase right shoulder strength to a solid 4+/5 to increase stability for performance of functional activities.    Time  4    Period  Weeks    Status  On-going      PT LONG TERM GOAL #6   Title  Perform ADL's with right shoulder pain not > 2/10.    Time  4    Period  Weeks    Status  On-going            Plan - 07/16/18 5277    Clinical Impression Statement  Patient tolerated today's treatment fairly well as he reported picking up cases of water. Patient able to tolerate the seated AAROM exercises without any reports of any increased pain due to exercises. Good observable PROM of R shoulder with all directions with firm end feels and smooth arc of motion. Normal modalities response noted following removal of the modalities.    Rehab Potential  Excellent    PT Frequency  3x / week    PT Duration  4 weeks    PT Treatment/Interventions  ADLs/Self Care Home Management;Cryotherapy;Electrical Stimulation;Ultrasound;Moist Heat;Therapeutic activities;Therapeutic exercise;Patient/family education;Neuromuscular re-education;Manual techniques;Passive range of motion;Vasopneumatic Device    PT Next Visit Plan  Please begin with PROM to patient's right shoulder to restore ROM then progress to pulleys and UE ranger.  Please review supine cane  exercises and show standing passive counter stretch to increase flexion.  Eventual progression to PRE's.  Vasopneumatic and electrical stimulation.    Consulted and Agree with Plan of Care  Patient       Patient will benefit from skilled  therapeutic intervention in order to improve the following deficits and impairments:  Decreased activity tolerance, Pain, Decreased range of motion  Visit Diagnosis: Acute pain of right shoulder  Stiffness of right shoulder, not elsewhere classified     Problem List Patient Active Problem List   Diagnosis Date Noted  . Bilateral hip pain 10/24/2017  . Shoulder pain, bilateral 10/24/2017  . Arthritis 10/24/2017  . Osteoarthritis of both hips 10/24/2017  . Avascular necrosis of bone of hip, right ([place]) 10/24/2017  . 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 PTSD (post-traumatic stress disorder) 08/20/2017  . Tardive dyskinesia 08/20/2017  . Severe major depression ([place]) 05/10/2017  . Affective psychosis, bipolar ([place]) 05/10/2017  . Vision disturbance 05/06/2017  . History of concussion 02/10/2017  . Mood disorder ([place]) 02/10/2017  . Adjustment insomnia 01/24/2017  . Adjustment disorder with anxious mood 01/24/2017  . GAD (generalized anxiety disorder) 01/24/2017  . Gastroesophageal reflux disease without esophagitis 11/19/2016  . Hypothyroidism 11/19/2016  . DDD (degenerative disc disease), lumbar 11/19/2016    Standley Brooking, PTA 07/16/2018, 8:30 AM  [facility] [place], [place], [address] Phone: [phone]   Fax:  [phone]  Name: Marvin Espinoza MRN: [id number] Date of Birth: 06/01/1971

## 2018-07-20 ENCOUNTER — Encounter: Payer: Self-pay | Admitting: Physical Therapy

## 2018-07-20 ENCOUNTER — Ambulatory Visit: Payer: BLUE CROSS/BLUE SHIELD | Admitting: Physical Therapy

## 2018-07-20 DIAGNOSIS — M25611 Stiffness of right shoulder, not elsewhere classified: Secondary | ICD-10-CM | POA: Diagnosis not present

## 2018-07-20 DIAGNOSIS — M25511 Pain in right shoulder: Secondary | ICD-10-CM

## 2018-07-20 NOTE — Therapy (Signed)
[facility] [place], [place], [address] Phone: [phone]   Fax:  [phone]  Physical Therapy Treatment  Patient Details  Name: Marvin Espinoza MRN: [id number] Date of Birth: 1970/12/21 Referring Provider: Justice Britain MD.   Encounter Date: 07/20/2018  PT End of Session - 07/20/18 0821    Visit Number  5    Number of Visits  12    Date for PT Re-Evaluation  08/05/18    Authorization Type  FOTO AT LEAST EVERY 5TH VISIT.    PT Start Time  0818    PT Stop Time  0909    PT Time Calculation (min)  51 min    Activity Tolerance  Patient tolerated treatment well    Behavior During Therapy  [facility] for tasks assessed/performed       Past Medical History:  Diagnosis Date  . Anxiety   . Bipolar disorder ([place])   . CTS (carpal tunnel syndrome)   . Depression   . GERD (gastroesophageal reflux disease)   . H/O bariatric surgery   . Head injury 05/06/2017  . Hypothyroidism   . Hypothyroidism   . Sleep apnea    no need for CPAP at this time  . Sleep apnea     Past Surgical History:  Procedure Laterality Date  . ANAL FISSURE REPAIR    . BARIATRIC SURGERY    . CARPAL TUNNEL RELEASE Left 05/09/2015   Procedure: LEFT CARPAL TUNNEL RELEASE;  Surgeon: Daryll Brod, MD;  Location: [place];  Service: Orthopedics;  Laterality: Left;  . CARPAL TUNNEL RELEASE Left 05-09-2015  . CARPAL TUNNEL RELEASE Right 06/20/2015   Procedure: RIGHT CARPAL TUNNEL RELEASE;  Surgeon: Daryll Brod, MD;  Location: [place];  Service: Orthopedics;  Laterality: Right;  REGIONAL/FAB  . CHOLECYSTECTOMY    . COLONOSCOPY    . UPPER GASTROINTESTINAL ENDOSCOPY      There were no vitals filed for this visit.  Subjective Assessment - 07/20/18 0820    Subjective  Reports that he has been working his arm some this weekend but still limited with lifting. Reports weedeating but had his son crank it. Reports that he still has trouble with  getting his ladder on top of his car for work.    Pertinent History  Head injury.    Patient Stated Goals  Use left arm without pain.    Currently in Pain?  Yes    Pain Score  6     Pain Location  Shoulder    Pain Orientation  Right    Pain Descriptors / Indicators  Discomfort    Pain Type  Surgical pain    Pain Onset  1 to 4 weeks ago         [facility] PT Assessment - 07/20/18 0001      Assessment   Medical Diagnosis  Impingement syndrome of right shoulder.    Onset Date/Surgical Date  06/23/18    Next MD Visit  07/2018      Restrictions   Weight Bearing Restrictions  No                   [facility] Adult PT Treatment/Exercise - 07/20/18 0001      Shoulder Exercises: Supine   Protraction  AAROM;Both;20 reps    External Rotation  AAROM;Right;20 reps    Flexion  AAROM;Both;20 reps      Shoulder Exercises: Standing   Other Standing Exercises  RUE ranger into flex, CW  and CCW x20 reps    Other Standing Exercises  Wall ladder (L30) x5      Shoulder Exercises: Pulleys   Flexion  5 minutes      Modalities   Modalities  Electrical Stimulation;Vasopneumatic      Electrical Stimulation   Electrical Stimulation Location  R shoulder    Electrical Stimulation Action  IFC    Electrical Stimulation Parameters  80-150 hz x15 min    Electrical Stimulation Goals  Pain      Vasopneumatic   Number Minutes Vasopneumatic   15 minutes    Vasopnuematic Location   Shoulder    Vasopneumatic Pressure  Low    Vasopneumatic Temperature   34      Manual Therapy   Manual Therapy  Passive ROM    Passive ROM  PROM of R shoulder into flexion, ER, IR with oscillations and gentle holds at end range                  PT Long Term Goals - 07/09/18 0831      PT LONG TERM GOAL #1   Title  Independent with a HEP.    Time  4    Period  Weeks    Status  Achieved      PT LONG TERM GOAL #2   Title  Active right shoulder flexion to 145 degrees so the patient can easily reach  overhead.    Time  4    Period  Weeks    Status  On-going      PT LONG TERM GOAL #3   Title  Active ER to 80 degrees+ to allow for easily donning/doffing of apparel.    Time  4    Period  Weeks    Status  On-going      PT LONG TERM GOAL #4   Title  Increase ROM so patient is able to reach behind back to L3.    Time  4    Period  Weeks    Status  On-going      PT LONG TERM GOAL #5   Title  Increase right shoulder strength to a solid 4+/5 to increase stability for performance of functional activities.    Time  4    Period  Weeks    Status  On-going      PT LONG TERM GOAL #6   Title  Perform ADL's with right shoulder pain not > 2/10.    Time  4    Period  Weeks    Status  On-going            Plan - 07/20/18 0859    Clinical Impression Statement  Patient tolerated today's treatment well with new AAROM exercises but no reports of any increased pain. Patient did report a "catch" in R shoulder with supine AAROM flexion. No complaints with PROM of R shoulder in any directions. Smooth arc of motion and firm end feels noted with all directions of PROM. Normal modalities response noted following removal of the modalities.    Rehab Potential  Excellent    PT Frequency  3x / week    PT Duration  4 weeks    PT Treatment/Interventions  ADLs/Self Care Home Management;Cryotherapy;Electrical Stimulation;Ultrasound;Moist Heat;Therapeutic activities;Therapeutic exercise;Patient/family education;Neuromuscular re-education;Manual techniques;Passive range of motion;Vasopneumatic Device    PT Next Visit Plan  Please begin with PROM to patient's right shoulder to restore ROM then progress to pulleys and UE ranger.  Please review supine cane  exercises and show standing passive counter stretch to increase flexion.  Eventual progression to PRE's.  Vasopneumatic and electrical stimulation.    Consulted and Agree with Plan of Care  Patient       Patient will benefit from skilled therapeutic  intervention in order to improve the following deficits and impairments:  Decreased activity tolerance, Pain, Decreased range of motion  Visit Diagnosis: Acute pain of right shoulder  Stiffness of right shoulder, not elsewhere classified     Problem List Patient Active Problem List   Diagnosis Date Noted  . Bilateral hip pain 10/24/2017  . Shoulder pain, bilateral 10/24/2017  . Arthritis 10/24/2017  . Osteoarthritis of both hips 10/24/2017  . Avascular necrosis of bone of hip, right ([place]) 10/24/2017  . 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 PTSD (post-traumatic stress disorder) 08/20/2017  . Tardive dyskinesia 08/20/2017  . Severe major depression ([place]) 05/10/2017  . Affective psychosis, bipolar ([place]) 05/10/2017  . Vision disturbance 05/06/2017  . History of concussion 02/10/2017  . Mood disorder ([place]) 02/10/2017  . Adjustment insomnia 01/24/2017  . Adjustment disorder with anxious mood 01/24/2017  . GAD (generalized anxiety disorder) 01/24/2017  . Gastroesophageal reflux disease without esophagitis 11/19/2016  . Hypothyroidism 11/19/2016  . DDD (degenerative disc disease), lumbar 11/19/2016    Standley Brooking, PTA 07/20/2018, 9:41 AM  [facility] [address] [place], [place], [address] Phone: [phone]   Fax:  [phone]  Name: Austen Oyster MRN: [id number] Date of Birth: Oct 21, 1971

## 2018-07-21 ENCOUNTER — Encounter: Payer: Self-pay | Admitting: Gastroenterology

## 2018-07-22 ENCOUNTER — Encounter: Payer: Self-pay | Admitting: Physical Therapy

## 2018-07-22 ENCOUNTER — Ambulatory Visit: Payer: BLUE CROSS/BLUE SHIELD | Admitting: Physical Therapy

## 2018-07-22 DIAGNOSIS — M25511 Pain in right shoulder: Secondary | ICD-10-CM

## 2018-07-22 DIAGNOSIS — M25611 Stiffness of right shoulder, not elsewhere classified: Secondary | ICD-10-CM

## 2018-07-22 NOTE — Patient Instructions (Signed)
Strengthening: Isometric Flexion   Using wall for resistance, press right fist into ball using light pressure. Hold __5__ seconds. Repeat __10__ times per set. Do __2__ sets per session. Do _2___ sessions per day.   Strengthening: Isometric Extension   Using wall for resistance, press back of right arm into ball using light pressure. Hold _5___ seconds. Repeat __10__ times per set. Do __2__ sets per session. Do __2__ sessions per day.   Strengthening: Isometric External Rotation   Using wall to provide resistance, and keeping right arm at side, press back of hand into ball using light pressure. Hold __5__ seconds. Repeat __10__ times per set. Do _2___ sets per session. Do __2__ sessions per day.   Strengthening: Isometric Internal Rotation   Using door frame for resistance, press palm of right hand into ball using light pressure. Keep elbow in at side. Hold _5___ seconds. Repeat __10__ times per set. Do _2___ sets per session. Do __2__ sessions per day.    

## 2018-07-22 NOTE — Therapy (Signed)
[facility] [place], [place], [address] Phone: [phone]   Fax:  [phone]  Physical Therapy Treatment  Patient Details  Name: Marvin Espinoza MRN: [id number] Date of Birth: 1971/08/27 Referring Provider: Justice Britain MD.   Encounter Date: 07/22/2018  PT End of Session - 07/22/18 0829    Visit Number  6    Number of Visits  12    Date for PT Re-Evaluation  08/05/18    Authorization Type  FOTO AT LEAST EVERY 5TH VISIT.    PT Start Time  0729    PT Stop Time  0828    PT Time Calculation (min)  59 min    Activity Tolerance  Patient tolerated treatment well    Behavior During Therapy  [facility] for tasks assessed/performed       Past Medical History:  Diagnosis Date  . Anxiety   . Bipolar disorder ([place])   . CTS (carpal tunnel syndrome)   . Depression   . GERD (gastroesophageal reflux disease)   . H/O bariatric surgery   . Head injury 05/06/2017  . Hypothyroidism   . Hypothyroidism   . Sleep apnea    no need for CPAP at this time  . Sleep apnea     Past Surgical History:  Procedure Laterality Date  . ANAL FISSURE REPAIR    . BARIATRIC SURGERY    . CARPAL TUNNEL RELEASE Left 05/09/2015   Procedure: LEFT CARPAL TUNNEL RELEASE;  Surgeon: Daryll Brod, MD;  Location: [place];  Service: Orthopedics;  Laterality: Left;  . CARPAL TUNNEL RELEASE Left 05-09-2015  . CARPAL TUNNEL RELEASE Right 06/20/2015   Procedure: RIGHT CARPAL TUNNEL RELEASE;  Surgeon: Daryll Brod, MD;  Location: [place];  Service: Orthopedics;  Laterality: Right;  REGIONAL/FAB  . CHOLECYSTECTOMY    . COLONOSCOPY    . UPPER GASTROINTESTINAL ENDOSCOPY      There were no vitals filed for this visit.  Subjective Assessment - 07/22/18 0732    Subjective  Patient arrived with some soreness after using weed eater yeterday    Pertinent History  Head injury.    Patient Stated Goals  Use left arm without pain.    Currently in  Pain?  Yes    Pain Score  7     Pain Location  Shoulder    Pain Orientation  Right    Pain Descriptors / Indicators  Discomfort    Pain Type  Surgical pain    Pain Onset  1 to 4 weeks ago    Pain Frequency  Intermittent    Aggravating Factors   increased activity    Pain Relieving Factors  at rest         [facility] PT Assessment - 07/22/18 0001      ROM / Strength   AROM / PROM / Strength  AROM;PROM      AROM   AROM Assessment Site  Shoulder    Right/Left Shoulder  Right    Right Shoulder Flexion  135 Degrees    Right Shoulder External Rotation  55 Degrees      PROM   PROM Assessment Site  Shoulder    Right/Left Shoulder  Right    Right Shoulder Flexion  150 Degrees    Right Shoulder External Rotation  70 Degrees                   [facility] Adult PT Treatment/Exercise - 07/22/18 0001  Shoulder Exercises: Supine   Protraction  AAROM;Both;20 reps    External Rotation  AAROM;Right;20 reps    Flexion  AAROM;Both;20 reps      Shoulder Exercises: Standing   Other Standing Exercises  RUE ranger into flex, CW and CCW x20 reps      Shoulder Exercises: Pulleys   Flexion  5 minutes      Shoulder Exercises: Isometric Strengthening   Flexion  5X5"    Extension  5X5"    External Rotation  5X5"    Internal Rotation  5X5"      Electrical Stimulation   Electrical Stimulation Location  R shoulder    Electrical Stimulation Action  IFC    Electrical Stimulation Parameters  80-150hz  x11min    Electrical Stimulation Goals  Pain      Vasopneumatic   Number Minutes Vasopneumatic   15 minutes    Vasopnuematic Location   Shoulder    Vasopneumatic Pressure  Low      Manual Therapy   Manual Therapy  Passive ROM    Passive ROM  PROM of R shoulder into flexion, ER, IR with oscillations and gentle holds at end range, then rhythmic stabs for ER/IR in scaption and flex/ext at 90 degrees             PT Education - 07/22/18 0819    Education Details  HEP    Person(s)  Educated  Patient    Methods  Explanation;Demonstration;Handout    Comprehension  Verbalized understanding;Returned demonstration          PT Long Term Goals - 07/09/18 0831      PT LONG TERM GOAL #1   Title  Independent with a HEP.    Time  4    Period  Weeks    Status  Achieved      PT LONG TERM GOAL #2   Title  Active right shoulder flexion to 145 degrees so the patient can easily reach overhead.    Time  4    Period  Weeks    Status  On-going      PT LONG TERM GOAL #3   Title  Active ER to 80 degrees+ to allow for easily donning/doffing of apparel.    Time  4    Period  Weeks    Status  On-going      PT LONG TERM GOAL #4   Title  Increase ROM so patient is able to reach behind back to L3.    Time  4    Period  Weeks    Status  On-going      PT LONG TERM GOAL #5   Title  Increase right shoulder strength to a solid 4+/5 to increase stability for performance of functional activities.    Time  4    Period  Weeks    Status  On-going      PT LONG TERM GOAL #6   Title  Perform ADL's with right shoulder pain not > 2/10.    Time  4    Period  Weeks    Status  On-going            Plan - 07/22/18 0820    Clinical Impression Statement  Patient tolerated treatment well today. Patient had increased soreness today from increased ADL's yesterday. Patient has improved with right shoulder ROM for ER and flexion. Educated patient on isometrics with HEP provided. Patient current goals ongoing.     Rehab Potential  Excellent  PT Frequency  3x / week    PT Duration  4 weeks    PT Treatment/Interventions  ADLs/Self Care Home Management;Cryotherapy;Electrical Stimulation;Ultrasound;Moist Heat;Therapeutic activities;Therapeutic exercise;Patient/family education;Neuromuscular re-education;Manual techniques;Passive range of motion;Vasopneumatic Device    PT Next Visit Plan  cont with ROM then eventual progression to PRE's.  Vasopneumatic and electrical stimulation.     Consulted and Agree with Plan of Care  Patient       Patient will benefit from skilled therapeutic intervention in order to improve the following deficits and impairments:  Decreased activity tolerance, Pain, Decreased range of motion  Visit Diagnosis: Acute pain of right shoulder  Stiffness of right shoulder, not elsewhere classified     Problem List Patient Active Problem List   Diagnosis Date Noted  . Bilateral hip pain 10/24/2017  . Shoulder pain, bilateral 10/24/2017  . Arthritis 10/24/2017  . Osteoarthritis of both hips 10/24/2017  . Avascular necrosis of bone of hip, right ([place]) 10/24/2017  . 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 PTSD (post-traumatic stress disorder) 08/20/2017  . Tardive dyskinesia 08/20/2017  . Severe major depression ([place]) 05/10/2017  . Affective psychosis, bipolar ([place]) 05/10/2017  . Vision disturbance 05/06/2017  . History of concussion 02/10/2017  . Mood disorder ([place]) 02/10/2017  . Adjustment insomnia 01/24/2017  . Adjustment disorder with anxious mood 01/24/2017  . GAD (generalized anxiety disorder) 01/24/2017  . Gastroesophageal reflux disease without esophagitis 11/19/2016  . Hypothyroidism 11/19/2016  . DDD (degenerative disc disease), lumbar 11/19/2016    Roda Lauture P, PTA 07/22/2018, 8:39 AM  [facility] [place], [place], [address] Phone: [phone]   Fax:  [phone]  Name: Marvin Espinoza MRN: [id number] Date of Birth: [birth year]

## 2018-07-24 ENCOUNTER — Ambulatory Visit: Payer: BLUE CROSS/BLUE SHIELD | Admitting: Physical Therapy

## 2018-07-24 ENCOUNTER — Encounter: Payer: Self-pay | Admitting: Physical Therapy

## 2018-07-24 DIAGNOSIS — M25611 Stiffness of right shoulder, not elsewhere classified: Secondary | ICD-10-CM

## 2018-07-24 DIAGNOSIS — M25511 Pain in right shoulder: Secondary | ICD-10-CM

## 2018-07-24 NOTE — Therapy (Signed)
[facility] [place], [place], [address] Phone: [phone]   Fax:  [phone]  Physical Therapy Treatment  Patient Details  Name: Marvin Espinoza MRN: [id number] Date of Birth: Apr 07, 1971 Referring Provider: Justice Britain MD.   Encounter Date: 07/24/2018  PT End of Session - 07/24/18 0724    Visit Number  7    Number of Visits  12    Date for PT Re-Evaluation  08/05/18    Authorization Type  FOTO AT LEAST EVERY 5TH VISIT.    PT Start Time  0732    PT Stop Time  0824    PT Time Calculation (min)  52 min    Activity Tolerance  Patient tolerated treatment well    Behavior During Therapy  [facility] for tasks assessed/performed       Past Medical History:  Diagnosis Date  . Anxiety   . Bipolar disorder ([place])   . CTS (carpal tunnel syndrome)   . Depression   . GERD (gastroesophageal reflux disease)   . H/O bariatric surgery   . Head injury 05/06/2017  . Hypothyroidism   . Hypothyroidism   . Sleep apnea    no need for CPAP at this time  . Sleep apnea     Past Surgical History:  Procedure Laterality Date  . ANAL FISSURE REPAIR    . BARIATRIC SURGERY    . CARPAL TUNNEL RELEASE Left 05/09/2015   Procedure: LEFT CARPAL TUNNEL RELEASE;  Surgeon: Daryll Brod, MD;  Location: [place];  Service: Orthopedics;  Laterality: Left;  . CARPAL TUNNEL RELEASE Left 05-09-2015  . CARPAL TUNNEL RELEASE Right 06/20/2015   Procedure: RIGHT CARPAL TUNNEL RELEASE;  Surgeon: Daryll Brod, MD;  Location: [place];  Service: Orthopedics;  Laterality: Right;  REGIONAL/FAB  . CHOLECYSTECTOMY    . COLONOSCOPY    . UPPER GASTROINTESTINAL ENDOSCOPY      There were no vitals filed for this visit.  Subjective Assessment - 07/24/18 0723    Subjective  Reports his shoulder is freeing up some.    Pertinent History  Head injury.    Patient Stated Goals  Use left arm without pain.    Currently in Pain?  Yes    Pain Score  4      Pain Location  Shoulder    Pain Orientation  Right    Pain Descriptors / Indicators  Discomfort    Pain Type  Surgical pain    Pain Onset  1 to 4 weeks ago         [facility] PT Assessment - 07/24/18 0001      Assessment   Medical Diagnosis  Impingement syndrome of right shoulder.    Onset Date/Surgical Date  06/23/18    Next MD Visit  07/2018      Restrictions   Weight Bearing Restrictions  No                   [facility] Adult PT Treatment/Exercise - 07/24/18 0001      Exercises   Exercises  Shoulder      Shoulder Exercises: Supine   Protraction  AAROM;Both;20 reps    External Rotation  AAROM;Right;20 reps    Flexion  AAROM;Both;20 reps      Shoulder Exercises: Standing   Other Standing Exercises  RUE ranger into flex, CW and CCW x20 reps      Shoulder Exercises: Pulleys   Flexion  5 minutes  Shoulder Exercises: Isometric Strengthening   Flexion  Other (comment)   10 reps 5 sec hold   Extension  Other (comment)   10 reps 5 sec hold   External Rotation  Other (comment)   10 reps 5 sec hold   Internal Rotation  Other (comment)   10 reps 5 sec hold     Modalities   Modalities  Electrical Stimulation;Vasopneumatic      Electrical Stimulation   Electrical Stimulation Location  R shoulder    Electrical Stimulation Action  IFC    Electrical Stimulation Parameters  80-150 hz x15 min    Electrical Stimulation Goals  Pain      Vasopneumatic   Number Minutes Vasopneumatic   15 minutes    Vasopnuematic Location   Shoulder    Vasopneumatic Pressure  Medium    Vasopneumatic Temperature   34      Manual Therapy   Manual Therapy  Passive ROM    Passive ROM  PROM of R shoulder into flexion, ER, IR with oscillations and gentle holds at end range, then rhythmic stabs for ER/IR and flex/ext at 90 degrees                  PT Long Term Goals - 07/09/18 0831      PT LONG TERM GOAL #1   Title  Independent with a HEP.    Time  4    Period  Weeks     Status  Achieved      PT LONG TERM GOAL #2   Title  Active right shoulder flexion to 145 degrees so the patient can easily reach overhead.    Time  4    Period  Weeks    Status  On-going      PT LONG TERM GOAL #3   Title  Active ER to 80 degrees+ to allow for easily donning/doffing of apparel.    Time  4    Period  Weeks    Status  On-going      PT LONG TERM GOAL #4   Title  Increase ROM so patient is able to reach behind back to L3.    Time  4    Period  Weeks    Status  On-going      PT LONG TERM GOAL #5   Title  Increase right shoulder strength to a solid 4+/5 to increase stability for performance of functional activities.    Time  4    Period  Weeks    Status  On-going      PT LONG TERM GOAL #6   Title  Perform ADL's with right shoulder pain not > 2/10.    Time  4    Period  Weeks    Status  On-going            Plan - 07/24/18 0850    Clinical Impression Statement  Patient tolerated today's treratment well although he is still limited slightly with end range flexion required for his job. Patient was assessed with technique for all isometrics with minmal cueing required overall but VCs to avoid bodyweight leaning for protraction isometric. Patient reported discomfort with self resisted R shoulder flexion at approximately R AC joint. R shoulder rhythmic stabs completed in ER and flexion with good stability noted. Firm end feels noted with smooth arc of motion as well. Normal modalities response noted following removal of the modalities.    Rehab Potential  Excellent    PT  Frequency  3x / week    PT Duration  4 weeks    PT Treatment/Interventions  ADLs/Self Care Home Management;Cryotherapy;Electrical Stimulation;Ultrasound;Moist Heat;Therapeutic activities;Therapeutic exercise;Patient/family education;Neuromuscular re-education;Manual techniques;Passive range of motion;Vasopneumatic Device    PT Next Visit Plan  cont with ROM then eventual progression to PRE's.   Vasopneumatic and electrical stimulation.    Consulted and Agree with Plan of Care  Patient       Patient will benefit from skilled therapeutic intervention in order to improve the following deficits and impairments:  Decreased activity tolerance, Pain, Decreased range of motion  Visit Diagnosis: Acute pain of right shoulder  Stiffness of right shoulder, not elsewhere classified     Problem List Patient Active Problem List   Diagnosis Date Noted  . Bilateral hip pain 10/24/2017  . Shoulder pain, bilateral 10/24/2017  . Arthritis 10/24/2017  . Osteoarthritis of both hips 10/24/2017  . Avascular necrosis of bone of hip, right ([place]) 10/24/2017  . 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 PTSD (post-traumatic stress disorder) 08/20/2017  . Tardive dyskinesia 08/20/2017  . Severe major depression ([place]) 05/10/2017  . Affective psychosis, bipolar ([place]) 05/10/2017  . Vision disturbance 05/06/2017  . History of concussion 02/10/2017  . Mood disorder ([place]) 02/10/2017  . Adjustment insomnia 01/24/2017  . Adjustment disorder with anxious mood 01/24/2017  . GAD (generalized anxiety disorder) 01/24/2017  . Gastroesophageal reflux disease without esophagitis 11/19/2016  . Hypothyroidism 11/19/2016  . DDD (degenerative disc disease), lumbar 11/19/2016    Standley Brooking, PTA 07/24/2018, 8:57 AM  [facility] [address] [place], [place], [address] Phone: [phone]   Fax:  [phone]  Name: Marvin Espinoza MRN: [id number] Date of Birth: 04-14-1971

## 2018-07-27 ENCOUNTER — Encounter: Payer: Self-pay | Admitting: Physical Therapy

## 2018-07-27 ENCOUNTER — Ambulatory Visit: Payer: BLUE CROSS/BLUE SHIELD | Admitting: Physical Therapy

## 2018-07-27 DIAGNOSIS — M25511 Pain in right shoulder: Secondary | ICD-10-CM | POA: Diagnosis not present

## 2018-07-27 DIAGNOSIS — M25611 Stiffness of right shoulder, not elsewhere classified: Secondary | ICD-10-CM | POA: Diagnosis not present

## 2018-07-27 NOTE — Therapy (Signed)
[facility] [place], [place], [address] Phone: [phone]   Fax:  [phone]  Physical Therapy Treatment  Patient Details  Name: Marvin Espinoza MRN: [id number] Date of Birth: [birth year] Referring Provider: Justice Britain MD.   Encounter Date: 07/27/2018  PT End of Session - 07/27/18 0810    Visit Number  8    Number of Visits  12    Date for PT Re-Evaluation  08/05/18    Authorization Type  FOTO AT LEAST EVERY 5TH VISIT.    PT Start Time  [phone]    PT Stop Time  0826    PT Time Calculation (min)  55 min    Activity Tolerance  Patient tolerated treatment well    Behavior During Therapy  [facility] for tasks assessed/performed       Past Medical History:  Diagnosis Date  . Anxiety   . Bipolar disorder ([place])   . CTS (carpal tunnel syndrome)   . Depression   . GERD (gastroesophageal reflux disease)   . H/O bariatric surgery   . Head injury 05/06/2017  . Hypothyroidism   . Hypothyroidism   . Sleep apnea    no need for CPAP at this time  . Sleep apnea     Past Surgical History:  Procedure Laterality Date  . ANAL FISSURE REPAIR    . BARIATRIC SURGERY    . CARPAL TUNNEL RELEASE Left 05/09/2015   Procedure: LEFT CARPAL TUNNEL RELEASE;  Surgeon: Daryll Brod, MD;  Location: [place];  Service: Orthopedics;  Laterality: Left;  . CARPAL TUNNEL RELEASE Left 05-09-2015  . CARPAL TUNNEL RELEASE Right 06/20/2015   Procedure: RIGHT CARPAL TUNNEL RELEASE;  Surgeon: Daryll Brod, MD;  Location: [place];  Service: Orthopedics;  Laterality: Right;  REGIONAL/FAB  . CHOLECYSTECTOMY    . COLONOSCOPY    . UPPER GASTROINTESTINAL ENDOSCOPY      There were no vitals filed for this visit.  Subjective Assessment - 07/27/18 0738    Subjective  Patient reported doing well last week with some ongoing discomfort    Pertinent History  Head injury.    Patient Stated Goals  Use left arm without pain.    Currently in  Pain?  Yes    Pain Score  4     Pain Location  Shoulder    Pain Orientation  Right    Pain Descriptors / Indicators  Discomfort    Pain Type  Surgical pain    Pain Onset  1 to 4 weeks ago    Pain Frequency  Intermittent    Aggravating Factors   increased activity    Pain Relieving Factors  at rest         [facility] PT Assessment - 07/27/18 0001      AROM   AROM Assessment Site  Shoulder    Right/Left Shoulder  Right    Right Shoulder Flexion  136 Degrees    Right Shoulder External Rotation  62 Degrees      PROM   PROM Assessment Site  Shoulder    Right/Left Shoulder  Right    Right Shoulder Flexion  153 Degrees    Right Shoulder External Rotation  71 Degrees                   [facility] Adult PT Treatment/Exercise - 07/27/18 0001      Shoulder Exercises: Supine   Protraction  AAROM;Both;20 reps    External Rotation  AAROM;Right;20  reps    Flexion  AAROM;Both;20 reps      Shoulder Exercises: Standing   Other Standing Exercises  RUE ranger into flex, CW and CCW x20 reps      Shoulder Exercises: Pulleys   Flexion  5 minutes      Electrical Stimulation   Electrical Stimulation Location  R shoulder    Electrical Stimulation Action  IFC    Electrical Stimulation Parameters  80-150hz  x86min    Electrical Stimulation Goals  Pain      Vasopneumatic   Number Minutes Vasopneumatic   15 minutes    Vasopnuematic Location   Shoulder    Vasopneumatic Pressure  Medium      Manual Therapy   Manual Therapy  Passive ROM    Passive ROM  PROM of R shoulder into flexion, ER, IR with oscillations and gentle holds at end range, then rhythmic stabs for ER/IR and flex/ext at 90 degrees                  PT Long Term Goals - 07/27/18 0817      PT LONG TERM GOAL #1   Title  Independent with a HEP.    Time  4    Period  Weeks    Status  Achieved      PT LONG TERM GOAL #2   Title  Active right shoulder flexion to 145 degrees so the patient can easily reach overhead.     Time  4    Period  Weeks    Status  On-going   AROM 136 degrees 07/27/18     PT LONG TERM GOAL #3   Title  Active ER to 80 degrees+ to allow for easily donning/doffing of apparel.    Time  4    Period  Weeks    Status  On-going   AROM 62 degrees 07/27/18     PT LONG TERM GOAL #4   Title  Increase ROM so patient is able to reach behind back to L3.    Time  4    Period  Weeks    Status  --   NT 07/27/18     PT LONG TERM GOAL #5   Title  Increase right shoulder strength to a solid 4+/5 to increase stability for performance of functional activities.    Time  4    Period  Weeks    Status  On-going   NT 07/27/18     PT LONG TERM GOAL #6   Title  Perform ADL's with right shoulder pain not > 2/10.    Time  4    Period  Weeks    Status  On-going            Plan - 07/27/18 5638    Clinical Impression Statement  Patient tolerated treatment well today. Patient has been progressing with right shoulder ROM and doing well with HEP/isometrics. Patient has some ongoing discomfort with overhead movements when performing ROM supine or standing. Patient progressing toward goals with full ROM limitations and strength deficts.     Rehab Potential  Excellent    PT Frequency  3x / week    PT Duration  4 weeks    PT Treatment/Interventions  ADLs/Self Care Home Management;Cryotherapy;Electrical Stimulation;Ultrasound;Moist Heat;Therapeutic activities;Therapeutic exercise;Patient/family education;Neuromuscular re-education;Manual techniques;Passive range of motion;Vasopneumatic Device    PT Next Visit Plan  cont with ROM then progression to PRE's.  Vaso/ES PRN. MD note sent to Dr. Onnie Graham for F/U  Consulted and Agree with Plan of Care  Patient       Patient will benefit from skilled therapeutic intervention in order to improve the following deficits and impairments:  Decreased activity tolerance, Pain, Decreased range of motion  Visit Diagnosis: Acute pain of right shoulder  Stiffness  of right shoulder, not elsewhere classified     Problem List Patient Active Problem List   Diagnosis Date Noted  . Bilateral hip pain 10/24/2017  . Shoulder pain, bilateral 10/24/2017  . Arthritis 10/24/2017  . Osteoarthritis of both hips 10/24/2017  . Avascular necrosis of bone of hip, right ([place]) 10/24/2017  . 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 PTSD (post-traumatic stress disorder) 08/20/2017  . Tardive dyskinesia 08/20/2017  . Severe major depression ([place]) 05/10/2017  . Affective psychosis, bipolar ([place]) 05/10/2017  . Vision disturbance 05/06/2017  . History of concussion 02/10/2017  . Mood disorder ([place]) 02/10/2017  . Adjustment insomnia 01/24/2017  . Adjustment disorder with anxious mood 01/24/2017  . GAD (generalized anxiety disorder) 01/24/2017  . Gastroesophageal reflux disease without esophagitis 11/19/2016  . Hypothyroidism 11/19/2016  . DDD (degenerative disc disease), lumbar 11/19/2016    Ladean Raya, PTA 07/27/18 8:30 AM  [facility] [place], [place], [address] Phone: [phone]   Fax:  [phone]  Name: Marvin Espinoza MRN: [id number] Date of Birth: 27-Feb-1971

## 2018-07-29 ENCOUNTER — Encounter: Payer: Self-pay | Admitting: Physical Therapy

## 2018-07-30 ENCOUNTER — Ambulatory Visit: Payer: BLUE CROSS/BLUE SHIELD | Admitting: Physical Therapy

## 2018-07-30 DIAGNOSIS — M25611 Stiffness of right shoulder, not elsewhere classified: Secondary | ICD-10-CM

## 2018-07-30 DIAGNOSIS — M25511 Pain in right shoulder: Secondary | ICD-10-CM

## 2018-07-30 NOTE — Therapy (Signed)
[facility] [place], [place], [address] Phone: [phone]   Fax:  [phone]  Physical Therapy Treatment  Patient Details  Name: Marvin Espinoza MRN: [id number] Date of Birth: [birth year] Referring Provider: Justice Britain MD.   Encounter Date: 07/30/2018  PT End of Session - 07/30/18 0741    Visit Number  9    Number of Visits  24   per new order   Date for PT Re-Evaluation  09/03/18    Authorization Type  FOTO AT LEAST EVERY 5TH VISIT, Progress note every 10th visit     PT Start Time  0732    PT Stop Time  0822    PT Time Calculation (min)  50 min    Activity Tolerance  Patient tolerated treatment well    Behavior During Therapy  [facility] for tasks assessed/performed       Past Medical History:  Diagnosis Date  . Anxiety   . Bipolar disorder ([place])   . CTS (carpal tunnel syndrome)   . Depression   . GERD (gastroesophageal reflux disease)   . H/O bariatric surgery   . Head injury 05/06/2017  . Hypothyroidism   . Hypothyroidism   . Sleep apnea    no need for CPAP at this time  . Sleep apnea     Past Surgical History:  Procedure Laterality Date  . ANAL FISSURE REPAIR    . BARIATRIC SURGERY    . CARPAL TUNNEL RELEASE Left 05/09/2015   Procedure: LEFT CARPAL TUNNEL RELEASE;  Surgeon: Daryll Brod, MD;  Location: [place];  Service: Orthopedics;  Laterality: Left;  . CARPAL TUNNEL RELEASE Left 05-09-2015  . CARPAL TUNNEL RELEASE Right 06/20/2015   Procedure: RIGHT CARPAL TUNNEL RELEASE;  Surgeon: Daryll Brod, MD;  Location: [place];  Service: Orthopedics;  Laterality: Right;  REGIONAL/FAB  . CHOLECYSTECTOMY    . COLONOSCOPY    . UPPER GASTROINTESTINAL ENDOSCOPY      There were no vitals filed for this visit.  Subjective Assessment - 07/30/18 0825    Subjective  Patient reported MD's follow up went well but is still having ongoing pain under acromion with flexion.    Pertinent History   Head injury.    Patient Stated Goals  Use left arm without pain.    Currently in Pain?  Yes    Pain Score  6     Pain Location  Shoulder    Pain Orientation  Right    Pain Descriptors / Indicators  Discomfort    Pain Type  Surgical pain    Pain Onset  More than a month ago    Pain Frequency  Intermittent         [facility] PT Assessment - 07/30/18 0001      Assessment   Medical Diagnosis  Impingement syndrome of right shoulder.    Onset Date/Surgical Date  06/23/18    Next MD Visit  08/12/18 for MRI results                   [facility] Adult PT Treatment/Exercise - 07/30/18 0001      Exercises   Exercises  Shoulder      Shoulder Exercises: Supine   Protraction  --    External Rotation  --    Flexion  --      Shoulder Exercises: Standing   Other Standing Exercises  RUE ranger into flex, CW and CCW x20 reps    Other Standing  Exercises  Wall ladder (L33) x5      Shoulder Exercises: Pulleys   Flexion  5 minutes      Modalities   Modalities  Electrical Stimulation;Vasopneumatic      Electrical Stimulation   Electrical Stimulation Location  R shoulder    Electrical Stimulation Action  IFC    Electrical Stimulation Parameters  80-150 hz x15 min    Electrical Stimulation Goals  Pain      Vasopneumatic   Number Minutes Vasopneumatic   15 minutes    Vasopnuematic Location   Shoulder    Vasopneumatic Pressure  Medium      Manual Therapy   Manual Therapy  Passive ROM;Joint mobilization    Joint Mobilization  right shoulder LAQ and grade 1 and 2 posterior joint mobs     Passive ROM  PROM of R shoulder into flexion, ER, IR with oscillations and gentle holds at end range, then rhythmic stabs for ER/IR and flex/ext at 90 degrees                  PT Long Term Goals - 07/27/18 0817      PT LONG TERM GOAL #1   Title  Independent with a HEP.    Time  4    Period  Weeks    Status  Achieved      PT LONG TERM GOAL #2   Title  Active right shoulder flexion to  145 degrees so the patient can easily reach overhead.    Time  4    Period  Weeks    Status  On-going   AROM 136 degrees 07/27/18     PT LONG TERM GOAL #3   Title  Active ER to 80 degrees+ to allow for easily donning/doffing of apparel.    Time  4    Period  Weeks    Status  On-going   AROM 62 degrees 07/27/18     PT LONG TERM GOAL #4   Title  Increase ROM so patient is able to reach behind back to L3.    Time  4    Period  Weeks    Status  --   NT 07/27/18     PT LONG TERM GOAL #5   Title  Increase right shoulder strength to a solid 4+/5 to increase stability for performance of functional activities.    Time  4    Period  Weeks    Status  On-going   NT 07/27/18     PT LONG TERM GOAL #6   Title  Perform ADL's with right shoulder pain not > 2/10.    Time  4    Period  Weeks    Status  On-going            Plan - 07/30/18 3818    Clinical Impression Statement  Patient was able to tolerate treatement well despite reports increased shoulder pain at the start of session. Patient was able to complete exercises with reports of pain under acromion with end ranges. Patient was able to tolerate joint mobilizations and reported decrease in pain after manual therapy. Normal response to modalities upon removal.     Clinical Presentation  Stable    Clinical Decision Making  Low    Rehab Potential  Excellent    PT Frequency  3x / week    PT Duration  4 weeks    PT Treatment/Interventions  ADLs/Self Care Home Management;Cryotherapy;Electrical Stimulation;Ultrasound;Moist Heat;Therapeutic activities;Therapeutic exercise;Patient/family education;Neuromuscular re-education;Manual  techniques;Passive range of motion;Vasopneumatic Device    PT Next Visit Plan  cont with ROM then progression to PRE's.  Vaso/ES PRN. MD note sent to Dr. Onnie Graham for F/U     Consulted and Agree with Plan of Care  Patient       Patient will benefit from skilled therapeutic intervention in order to improve the  following deficits and impairments:  Decreased activity tolerance, Pain, Decreased range of motion  Visit Diagnosis: Acute pain of right shoulder  Stiffness of right shoulder, not elsewhere classified     Problem List Patient Active Problem List   Diagnosis Date Noted  . Bilateral hip pain 10/24/2017  . Shoulder pain, bilateral 10/24/2017  . Arthritis 10/24/2017  . Osteoarthritis of both hips 10/24/2017  . Avascular necrosis of bone of hip, right ([place]) 10/24/2017  . 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 PTSD (post-traumatic stress disorder) 08/20/2017  . Tardive dyskinesia 08/20/2017  . Severe major depression ([place]) 05/10/2017  . Affective psychosis, bipolar ([place]) 05/10/2017  . Vision disturbance 05/06/2017  . History of concussion 02/10/2017  . Mood disorder ([place]) 02/10/2017  . Adjustment insomnia 01/24/2017  . Adjustment disorder with anxious mood 01/24/2017  . GAD (generalized anxiety disorder) 01/24/2017  . Gastroesophageal reflux disease without esophagitis 11/19/2016  . Hypothyroidism 11/19/2016  . DDD (degenerative disc disease), lumbar 11/19/2016   Gabriela Eves, PT, DPT 07/30/2018, 9:11 AM  [facility] [address] [place], [place], [address] Phone: [phone]   Fax:  [phone]   Name: Marvin Espinoza MRN: [id number] Date of Birth: 05/05/1971

## 2018-07-31 ENCOUNTER — Ambulatory Visit: Payer: BLUE CROSS/BLUE SHIELD | Admitting: Physical Therapy

## 2018-07-31 ENCOUNTER — Encounter: Payer: Self-pay | Admitting: Physical Therapy

## 2018-07-31 DIAGNOSIS — M25611 Stiffness of right shoulder, not elsewhere classified: Secondary | ICD-10-CM | POA: Diagnosis not present

## 2018-07-31 DIAGNOSIS — M25511 Pain in right shoulder: Secondary | ICD-10-CM

## 2018-07-31 NOTE — Therapy (Addendum)
[facility] [place], [place], [address] Phone: [phone]   Fax:  [phone]  Physical Therapy Treatment Progress Note Reporting Period 07/16/18 to 07/31/18  See note below for Objective Data and Assessment of Progress/Goals.      Patient Details  Name: Marvin Espinoza MRN: [id number] Date of Birth: 04/17/1971 Referring Provider: Justice Britain MD.   Encounter Date: 07/31/2018  PT End of Session - 07/31/18 0745    Visit Number  10    Number of Visits  24    Date for PT Re-Evaluation  09/03/18    Authorization Type  FOTO AT LEAST EVERY 5TH VISIT, Progress note every 10th visit     PT Start Time  0730    PT Stop Time  0826    PT Time Calculation (min)  56 min    Activity Tolerance  Patient tolerated treatment well    Behavior During Therapy  [facility] for tasks assessed/performed       Past Medical History:  Diagnosis Date  . Anxiety   . Bipolar disorder ([place])   . CTS (carpal tunnel syndrome)   . Depression   . GERD (gastroesophageal reflux disease)   . H/O bariatric surgery   . Head injury 05/06/2017  . Hypothyroidism   . Hypothyroidism   . Sleep apnea    no need for CPAP at this time  . Sleep apnea     Past Surgical History:  Procedure Laterality Date  . ANAL FISSURE REPAIR    . BARIATRIC SURGERY    . CARPAL TUNNEL RELEASE Left 05/09/2015   Procedure: LEFT CARPAL TUNNEL RELEASE;  Surgeon: Daryll Brod, MD;  Location: [place];  Service: Orthopedics;  Laterality: Left;  . CARPAL TUNNEL RELEASE Left 05-09-2015  . CARPAL TUNNEL RELEASE Right 06/20/2015   Procedure: RIGHT CARPAL TUNNEL RELEASE;  Surgeon: Daryll Brod, MD;  Location: [place];  Service: Orthopedics;  Laterality: Right;  REGIONAL/FAB  . CHOLECYSTECTOMY    . COLONOSCOPY    . UPPER GASTROINTESTINAL ENDOSCOPY      There were no vitals filed for this visit.  Subjective Assessment - 07/31/18 0735    Subjective  Patient  reports pain  is around 5-6/10.     Pertinent History  Head injury.    Patient Stated Goals  Use left arm without pain.    Currently in Pain?  Yes    Pain Score  6     Pain Location  Shoulder    Pain Orientation  Right    Pain Descriptors / Indicators  Discomfort    Pain Type  Surgical pain    Pain Onset  More than a month ago    Pain Frequency  Intermittent         [facility] PT Assessment - 07/31/18 0001      Assessment   Medical Diagnosis  Impingement syndrome of right shoulder.    Onset Date/Surgical Date  06/23/18    Next MD Visit  08/12/18 for L shld MRI results      AROM   Right/Left Shoulder  Right    Right Shoulder Flexion  140 Degrees    Right Shoulder External Rotation  64 Degrees      PROM   PROM Assessment Site  --    Right/Left Shoulder  --                   [facility] Adult PT Treatment/Exercise - 07/31/18 0001  Exercises   Exercises  Shoulder      Shoulder Exercises: Standing   Other Standing Exercises  RUE ranger into flex, CW and CCW x30 reps      Shoulder Exercises: Pulleys   Flexion  5 minutes      Shoulder Exercises: ROM/Strengthening   Other ROM/Strengthening Exercises  wall washes: circles with ranging flexion 4x1 minute      Modalities   Modalities  Electrical Stimulation;Vasopneumatic      Electrical Stimulation   Electrical Stimulation Location  R shoulder    Electrical Stimulation Action  IFC    Electrical Stimulation Parameters  80-150 hz x15 min    Electrical Stimulation Goals  Pain      Vasopneumatic   Number Minutes Vasopneumatic   15 minutes    Vasopnuematic Location   Shoulder    Vasopneumatic Pressure  Medium      Manual Therapy   Manual Therapy  Passive ROM;Joint mobilization    Joint Mobilization  right shoulder LAQ and grade 1 and 2 posterior joint mobs     Passive ROM  PROM of R shoulder into flexion, ER, IR with oscillations and gentle holds at end range, then rhythmic stabs for ER/IR and flex/ext at 90 degrees                   PT Long Term Goals - 07/27/18 0817      PT LONG TERM GOAL #1   Title  Independent with a HEP.    Time  4    Period  Weeks    Status  Achieved      PT LONG TERM GOAL #2   Title  Active right shoulder flexion to 145 degrees so the patient can easily reach overhead.    Time  4    Period  Weeks    Status  On-going   AROM 136 degrees 07/27/18     PT LONG TERM GOAL #3   Title  Active ER to 80 degrees+ to allow for easily donning/doffing of apparel.    Time  4    Period  Weeks    Status  On-going   AROM 62 degrees 07/27/18     PT LONG TERM GOAL #4   Title  Increase ROM so patient is able to reach behind back to L3.    Time  4    Period  Weeks    Status  --   NT 07/27/18     PT LONG TERM GOAL #5   Title  Increase right shoulder strength to a solid 4+/5 to increase stability for performance of functional activities.    Time  4    Period  Weeks    Status  On-going   NT 07/27/18     PT LONG TERM GOAL #6   Title  Perform ADL's with right shoulder pain not > 2/10.    Time  4    Period  Weeks    Status  On-going            Plan - 07/31/18 0834    Clinical Impression Statement  Patient was able to tolerate treatment well with minimal reports of pain during AAROM exercises.  Patient continues to have pain under the acromion with end range PROM. Patient educated importance of HEP. Normal response to modalities at end of session.    Clinical Presentation  Stable    Clinical Decision Making  Low    Rehab Potential  Excellent  PT Frequency  3x / week    PT Duration  4 weeks    PT Treatment/Interventions  ADLs/Self Care Home Management;Cryotherapy;Electrical Stimulation;Ultrasound;Moist Heat;Therapeutic activities;Therapeutic exercise;Patient/family education;Neuromuscular re-education;Manual techniques;Passive range of motion;Vasopneumatic Device    PT Next Visit Plan  cont with ROM then progression to PRE's.  Vaso/ES PRN. MD note sent to Dr. Onnie Graham  for F/U     Consulted and Agree with Plan of Care  Patient       Patient will benefit from skilled therapeutic intervention in order to improve the following deficits and impairments:  Decreased activity tolerance, Pain, Decreased range of motion  Visit Diagnosis: Acute pain of right shoulder  Stiffness of right shoulder, not elsewhere classified     Problem List Patient Active Problem List   Diagnosis Date Noted  . Bilateral hip pain 10/24/2017  . Shoulder pain, bilateral 10/24/2017  . Arthritis 10/24/2017  . Osteoarthritis of both hips 10/24/2017  . Avascular necrosis of bone of hip, right ([place]) 10/24/2017  . 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 PTSD (post-traumatic stress disorder) 08/20/2017  . Tardive dyskinesia 08/20/2017  . Espinoza major depression ([place]) 05/10/2017  . Affective psychosis, bipolar ([place]) 05/10/2017  . Vision disturbance 05/06/2017  . History of concussion 02/10/2017  . Mood disorder ([place]) 02/10/2017  . Adjustment insomnia 01/24/2017  . Adjustment disorder with anxious mood 01/24/2017  . GAD (generalized anxiety disorder) 01/24/2017  . Gastroesophageal reflux disease without esophagitis 11/19/2016  . Hypothyroidism 11/19/2016  . DDD (degenerative disc disease), lumbar 11/19/2016   Gabriela Eves, PT, DPT 07/31/2018, 9:11 AM  [facility] [address] [place], [place], [address] Phone: [phone]   Fax:  [phone]  Name: Marvin Espinoza MRN: [id number] Date of Birth: September 12, 1971

## 2018-08-03 ENCOUNTER — Encounter: Payer: Self-pay | Admitting: Physician Assistant

## 2018-08-03 ENCOUNTER — Ambulatory Visit (INDEPENDENT_AMBULATORY_CARE_PROVIDER_SITE_OTHER): Payer: BLUE CROSS/BLUE SHIELD | Admitting: Physician Assistant

## 2018-08-03 VITALS — BP 111/83 | HR 81 | Temp 97.9°F | Ht 70.0 in | Wt 224.0 lb

## 2018-08-03 DIAGNOSIS — M160 Bilateral primary osteoarthritis of hip: Secondary | ICD-10-CM | POA: Diagnosis not present

## 2018-08-03 DIAGNOSIS — M5136 Other intervertebral disc degeneration, lumbar region: Secondary | ICD-10-CM | POA: Diagnosis not present

## 2018-08-03 DIAGNOSIS — M19012 Primary osteoarthritis, left shoulder: Secondary | ICD-10-CM | POA: Diagnosis not present

## 2018-08-03 DIAGNOSIS — M19011 Primary osteoarthritis, right shoulder: Secondary | ICD-10-CM | POA: Diagnosis not present

## 2018-08-03 MED ORDER — ZOLPIDEM TARTRATE 10 MG PO TABS: 10.0000 mg | ORAL_TABLET | Freq: Every evening | ORAL | 5 refills | Status: DC | PRN

## 2018-08-03 MED ORDER — METHYLPREDNISOLONE ACETATE 80 MG/ML IJ SUSP
80.0000 mg | Freq: Once | INTRAMUSCULAR | Status: AC
  Administered 2018-08-03: 80 mg via INTRAMUSCULAR

## 2018-08-03 MED ORDER — OXYCODONE-ACETAMINOPHEN 10-325 MG PO TABS: 1.0000 | ORAL_TABLET | ORAL | 0 refills | Status: DC | PRN

## 2018-08-03 MED ORDER — TIZANIDINE HCL 6 MG PO CAPS: 6.0000 mg | ORAL_CAPSULE | Freq: Three times a day (TID) | ORAL | 1 refills | Status: DC

## 2018-08-03 NOTE — Progress Notes (Signed)
BP 111/83   Pulse 81   Temp 97.9 F (36.6 C) (Oral)   Ht 5\' 10"  (1.778 m)   Wt 224 lb (101.6 kg)   BMI 32.14 kg/m    Subjective:    Patient ID: Marvin Espinoza, male    DOB: 1971/10/10, 47 y.o.   MRN: [id number]  HPI: Marvin Espinoza is a 47 y.o. male presenting on 08/03/2018 for Back Pain  Patient comes in with a severe amount of lower back pain.  He does have known degenerative disc disease with some protrusion.  Several years ago this was found positive on an MRI.  He was able to avoid surgery for several years.  But at this time he is having a significant amount of pain most of the time.  He is unable to do his work and is in a normal fashion.  He cannot get down and crawl.  And he cannot get up on ladders.  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  He is not able to use his pressure washer due to the pain.  He states he had an MRI performed through the orthopedic office.  Begin to try to get her hands on this.  We will also plan for him to have physical therapy in the local office.  We are going to start pain medication.  He is still under the care of orthopedics for his shoulder disorder and other arthritis problems.  Past Medical History:  Diagnosis Date  . Anxiety   . Bipolar disorder ([place])   . CTS (carpal tunnel syndrome)   . Depression   . GERD (gastroesophageal reflux disease)   . H/O bariatric surgery   . Head injury 05/06/2017  . Hypothyroidism   . Hypothyroidism   . Sleep apnea    no need for CPAP at this time  . Sleep apnea    Relevant past medical, surgical, family and social history reviewed and updated as indicated. Interim medical history since our last visit reviewed. Allergies and medications reviewed and updated. DATA REVIEWED: CHART IN EPIC  Family History reviewed for pertinent findings.  Review of Systems  Constitutional: Negative.  Negative for appetite change and fatigue.  Eyes: Negative for pain and visual disturbance.  Respiratory: Negative.   Negative for cough, chest tightness, shortness of breath and wheezing.   Cardiovascular: Negative.  Negative for chest pain, palpitations and leg swelling.  Gastrointestinal: Negative.  Negative for abdominal pain, diarrhea, nausea and vomiting.  Genitourinary: Negative.   Musculoskeletal: Positive for arthralgias, back pain, joint swelling and myalgias.  Skin: Negative.  Negative for color change and rash.  Neurological: Negative.  Negative for weakness, numbness and headaches.  Psychiatric/Behavioral: Negative.     Allergies as of 08/03/2018      Reactions   Aripiprazole Anaphylaxis, Swelling   "slurred speech"   Cariprazine Hcl Other (See Comments)   Latuda  [lurasidone Hcl] Other (See Comments)   Seroquel [quetiapine Fumarate]    tardive dyskinesia   Alcohol Itching   Drinking alcohol   Cariprazine Other (See Comments)   Tardive dyskenesia   Hydrocodone-acetaminophen Itching   Lurasidone Other (See Comments)   dysconesia   Victoza [liraglutide] Other (See Comments)   Severe heartburn   Victoza [liraglutide]    Severe heartburn   Bupropion Rash   Risperidone Rash      Medication List        Accurate as of 08/03/18  9:07 AM. Always use your most recent med list.  ALPRAZolam 1 MG tablet Commonly known as:  XANAX Take 1 mg by mouth as needed.   CALCIUM 1200 PO Take by mouth 2 (two) times daily.   Carbonyl Iron 15 MG Chew Chew by mouth daily.   levothyroxine 100 MCG tablet Commonly known as:  SYNTHROID, LEVOTHROID TAKE ONE (1) TABLET EACH DAY   lubiprostone 24 MCG capsule Commonly known as:  AMITIZA Take 1 capsule (24 mcg total) by mouth 2 (two) times daily with a meal.   multivitamin with minerals Tabs tablet Take 1 tablet by mouth 2 (two) times daily. For Vitamin supplementation   OLANZapine-FLUoxetine 6-25 MG capsule Commonly known as:  SYMBYAX Take by mouth at bedtime.   omeprazole 40 MG capsule Commonly known as:  PRILOSEC TAKE ONE (1)  CAPSULE EACH DAY   oxyCODONE-acetaminophen 10-325 MG tablet Commonly known as:  PERCOCET Take 1 tablet by mouth every 4 (four) hours as needed for pain.   polyethylene glycol-electrolytes 420 g solution Commonly known as:  NuLYTELY/GoLYTELY Take 4,000 mLs by mouth as directed.   promethazine 25 MG tablet Commonly known as:  PHENERGAN TAKE 1 TABLET EVERY 6 HOURS AS NEEDED FOR NAUSEA AND VOMITING   tizanidine 6 MG capsule Commonly known as:  ZANAFLEX Take 1 capsule (6 mg total) by mouth 3 (three) times daily.   vitamin B-12 250 MCG tablet Commonly known as:  CYANOCOBALAMIN Take 250 mcg by mouth daily.   zolpidem 10 MG tablet Commonly known as:  AMBIEN Take 1 tablet (10 mg total) by mouth at bedtime as needed for sleep.          Objective:    BP 111/83   Pulse 81   Temp 97.9 F (36.6 C) (Oral)   Ht 5\' 10"  (1.778 m)   Wt 224 lb (101.6 kg)   BMI 32.14 kg/m   Allergies  Allergen Reactions  . Aripiprazole Anaphylaxis and Swelling    "slurred speech"   . Cariprazine Hcl Other (See Comments)  . Latuda  [Lurasidone Hcl] Other (See Comments)  . Seroquel [Quetiapine Fumarate]     tardive dyskinesia  . Alcohol Itching    Drinking alcohol  . Cariprazine Other (See Comments)    Tardive dyskenesia  . Hydrocodone-Acetaminophen Itching  . Lurasidone Other (See Comments)    dysconesia  . Victoza [Liraglutide] Other (See Comments)    Severe heartburn   . Victoza [Liraglutide]     Severe heartburn  . Bupropion Rash  . Risperidone Rash    Wt Readings from Last 3 Encounters:  08/03/18 224 lb (101.6 kg)  07/14/18 221 lb (100.2 kg)  05/20/18 221 lb 4 oz (100.4 kg)    Physical Exam  Constitutional: He appears well-developed and well-nourished. No distress.  HENT:  Head: Normocephalic and atraumatic.  Eyes: Pupils are equal, round, and reactive to light. Conjunctivae and EOM are normal.  Cardiovascular: Normal rate, regular rhythm and normal heart sounds.    Pulmonary/Chest: Effort normal and breath sounds normal. No respiratory distress.  Musculoskeletal:       Lumbar back: He exhibits decreased range of motion, tenderness, pain and spasm.  Neurological:  Reflex Scores:      Patellar reflexes are 1+ on the right side and 3+ on the left side. Skin: Skin is warm and dry.  Psychiatric: He has a normal mood and affect. His behavior is normal.  Nursing note and vitals reviewed.       Assessment & Plan:   1. Primary osteoarthritis of both shoulders Continue with  orthopedics  2. DDD (degenerative disc disease), lumbar - tizanidine (ZANAFLEX) 6 MG capsule; Take 1 capsule (6 mg total) by mouth 3 (three) times daily.  Dispense: 90 capsule; Refill: 1 - methylPREDNISolone acetate (DEPO-MEDROL) injection 80 mg - oxyCODONE-acetaminophen (PERCOCET) 10-325 MG tablet; Take 1 tablet by mouth every 4 (four) hours as needed for pain.  Dispense: 40 tablet; Refill: 0 Referral to Physical therapy  3. Primary osteoarthritis of both hips Continue with orthopedics   Continue all other maintenance medications as listed above.  Follow up plan: Return in about 4 weeks (around 08/31/2018) for record release for MRI of back [facility].  Educational handout given for back exercises  Terald Sleeper PA-C [facility] [address]  [place], [place] [address] [phone]   08/03/2018, 9:07 AM

## 2018-08-03 NOTE — Patient Instructions (Signed)

## 2018-08-04 ENCOUNTER — Ambulatory Visit: Payer: BLUE CROSS/BLUE SHIELD | Admitting: Physical Therapy

## 2018-08-04 DIAGNOSIS — M25511 Pain in right shoulder: Secondary | ICD-10-CM

## 2018-08-04 DIAGNOSIS — M25611 Stiffness of right shoulder, not elsewhere classified: Secondary | ICD-10-CM

## 2018-08-04 NOTE — Therapy (Signed)
[facility] [place], [place], [address] Phone: [phone]   Fax:  [phone]  Physical Therapy Treatment  Patient Details  Name: Marvin Espinoza MRN: [id number] Date of Birth: 1971-09-14 Referring Provider: Justice Britain MD.   Encounter Date: 08/04/2018  PT End of Session - 08/04/18 0727    Visit Number  11    Number of Visits  24    Date for PT Re-Evaluation  09/03/18    Authorization Type  FOTO AT LEAST EVERY 5TH VISIT, Progress note every 10th visit     PT Start Time  0730    PT Stop Time  0822    PT Time Calculation (min)  52 min    Activity Tolerance  Patient tolerated treatment well    Behavior During Therapy  [facility] for tasks assessed/performed       Past Medical History:  Diagnosis Date  . Anxiety   . Bipolar disorder ([place])   . CTS (carpal tunnel syndrome)   . Depression   . GERD (gastroesophageal reflux disease)   . H/O bariatric surgery   . Head injury 05/06/2017  . Hypothyroidism   . Hypothyroidism   . Sleep apnea    no need for CPAP at this time  . Sleep apnea     Past Surgical History:  Procedure Laterality Date  . ANAL FISSURE REPAIR    . BARIATRIC SURGERY    . CARPAL TUNNEL RELEASE Left 05/09/2015   Procedure: LEFT CARPAL TUNNEL RELEASE;  Surgeon: Daryll Brod, MD;  Location: [place];  Service: Orthopedics;  Laterality: Left;  . CARPAL TUNNEL RELEASE Left 05-09-2015  . CARPAL TUNNEL RELEASE Right 06/20/2015   Procedure: RIGHT CARPAL TUNNEL RELEASE;  Surgeon: Daryll Brod, MD;  Location: [place];  Service: Orthopedics;  Laterality: Right;  REGIONAL/FAB  . CHOLECYSTECTOMY    . COLONOSCOPY    . UPPER GASTROINTESTINAL ENDOSCOPY      There were no vitals filed for this visit.      [facility] PT Assessment - 08/04/18 0001      Assessment   Medical Diagnosis  Impingement syndrome of right shoulder.    Onset Date/Surgical Date  06/23/18    Next MD Visit  08/12/18 for L  shld MRI results      Observation/Other Assessments   Focus on Therapeutic Outcomes (FOTO)   41% limitation                   [facility] Adult PT Treatment/Exercise - 08/04/18 0001      Exercises   Exercises  Shoulder      Shoulder Exercises: Supine   Protraction  AAROM;Both;20 reps;10 reps    External Rotation  AAROM;Right;20 reps;10 reps    Flexion  AAROM;Both;20 reps;10 reps      Shoulder Exercises: Standing   Other Standing Exercises  RUE ranger into flex, CW and CCW x30 reps      Shoulder Exercises: Pulleys   Flexion  5 minutes      Modalities   Modalities  Electrical Stimulation;Vasopneumatic      Electrical Stimulation   Electrical Stimulation Location  R shoulder    Electrical Stimulation Action  IFC    Electrical Stimulation Parameters  80-150 hz x15 min    Electrical Stimulation Goals  Pain      Vasopneumatic   Number Minutes Vasopneumatic   15 minutes    Vasopnuematic Location   Shoulder    Vasopneumatic Pressure  Medium  Manual Therapy   Manual Therapy  Passive ROM;Joint mobilization                  PT Long Term Goals - 07/27/18 0817      PT LONG TERM GOAL #1   Title  Independent with a HEP.    Time  4    Period  Weeks    Status  Achieved      PT LONG TERM GOAL #2   Title  Active right shoulder flexion to 145 degrees so the patient can easily reach overhead.    Time  4    Period  Weeks    Status  On-going   AROM 136 degrees 07/27/18     PT LONG TERM GOAL #3   Title  Active ER to 80 degrees+ to allow for easily donning/doffing of apparel.    Time  4    Period  Weeks    Status  On-going   AROM 62 degrees 07/27/18     PT LONG TERM GOAL #4   Title  Increase ROM so patient is able to reach behind back to L3.    Time  4    Period  Weeks    Status  --   NT 07/27/18     PT LONG TERM GOAL #5   Title  Increase right shoulder strength to a solid 4+/5 to increase stability for performance of functional activities.    Time  4     Period  Weeks    Status  On-going   NT 07/27/18     PT LONG TERM GOAL #6   Title  Perform ADL's with right shoulder pain not > 2/10.    Time  4    Period  Weeks    Status  On-going              Patient will benefit from skilled therapeutic intervention in order to improve the following deficits and impairments:     Visit Diagnosis: Acute pain of right shoulder  Stiffness of right shoulder, not elsewhere classified     Problem List Patient Active Problem List   Diagnosis Date Noted  . Bilateral hip pain 10/24/2017  . Shoulder pain, bilateral 10/24/2017  . Arthritis 10/24/2017  . Osteoarthritis of both hips 10/24/2017  . Avascular necrosis of bone of hip, right ([place]) 10/24/2017  . 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 PTSD (post-traumatic stress disorder) 08/20/2017  . Tardive dyskinesia 08/20/2017  . Severe major depression ([place]) 05/10/2017  . Affective psychosis, bipolar ([place]) 05/10/2017  . Vision disturbance 05/06/2017  . History of concussion 02/10/2017  . Mood disorder ([place]) 02/10/2017  . Adjustment insomnia 01/24/2017  . Adjustment disorder with anxious mood 01/24/2017  . GAD (generalized anxiety disorder) 01/24/2017  . Gastroesophageal reflux disease without esophagitis 11/19/2016  . Hypothyroidism 11/19/2016  . DDD (degenerative disc disease), lumbar 11/19/2016   Gabriela Eves, PT, DPT 08/04/2018, 9:55 AM  [facility] [address] [place], [place], [address] Phone: [phone]   Fax:  [phone]  Name: Marvin Espinoza MRN: [id number] Date of Birth: 05-Apr-1971

## 2018-08-05 DIAGNOSIS — M25512 Pain in left shoulder: Secondary | ICD-10-CM | POA: Diagnosis not present

## 2018-08-06 ENCOUNTER — Encounter: Payer: Self-pay | Admitting: Physical Therapy

## 2018-08-06 ENCOUNTER — Ambulatory Visit: Payer: BLUE CROSS/BLUE SHIELD | Admitting: Physical Therapy

## 2018-08-06 DIAGNOSIS — M25611 Stiffness of right shoulder, not elsewhere classified: Secondary | ICD-10-CM | POA: Diagnosis not present

## 2018-08-06 DIAGNOSIS — M25511 Pain in right shoulder: Secondary | ICD-10-CM | POA: Diagnosis not present

## 2018-08-06 NOTE — Therapy (Signed)
[facility] [place], [place], [address] Phone: [phone]   Fax:  [phone]  Physical Therapy Treatment  Patient Details  Name: Marvin Espinoza MRN: [id number] Date of Birth: 28-Jan-1971 Referring Provider: Justice Britain MD.   Encounter Date: 08/06/2018  PT End of Session - 08/06/18 0736    Visit Number  12    Number of Visits  24    Date for PT Re-Evaluation  09/03/18    Authorization Type  FOTO AT LEAST EVERY 5TH VISIT, Progress note every 10th visit     PT Start Time  0730    PT Stop Time  0823    PT Time Calculation (min)  53 min    Activity Tolerance  Patient tolerated treatment well    Behavior During Therapy  [facility] for tasks assessed/performed       Past Medical History:  Diagnosis Date  . Anxiety   . Bipolar disorder ([place])   . CTS (carpal tunnel syndrome)   . Depression   . GERD (gastroesophageal reflux disease)   . H/O bariatric surgery   . Head injury 05/06/2017  . Hypothyroidism   . Hypothyroidism   . Sleep apnea    no need for CPAP at this time  . Sleep apnea     Past Surgical History:  Procedure Laterality Date  . ANAL FISSURE REPAIR    . BARIATRIC SURGERY    . CARPAL TUNNEL RELEASE Left 05/09/2015   Procedure: LEFT CARPAL TUNNEL RELEASE;  Surgeon: Daryll Brod, MD;  Location: [place];  Service: Orthopedics;  Laterality: Left;  . CARPAL TUNNEL RELEASE Left 05-09-2015  . CARPAL TUNNEL RELEASE Right 06/20/2015   Procedure: RIGHT CARPAL TUNNEL RELEASE;  Surgeon: Daryll Brod, MD;  Location: [place];  Service: Orthopedics;  Laterality: Right;  REGIONAL/FAB  . CHOLECYSTECTOMY    . COLONOSCOPY    . UPPER GASTROINTESTINAL ENDOSCOPY      There were no vitals filed for this visit.  Subjective Assessment - 08/06/18 0735    Subjective  Patient reports a "pulling" in R shoulder coming out of the car today that he has not experienced before.    Pertinent History  Head injury.     Patient Stated Goals  Use right arm without pain.    Currently in Pain?  Yes    Pain Score  6     Pain Location  Shoulder    Pain Orientation  Right    Pain Descriptors / Indicators  Discomfort    Pain Type  Surgical pain    Pain Onset  More than a month ago    Pain Frequency  Intermittent         [facility] PT Assessment - 08/06/18 0001      Assessment   Medical Diagnosis  Impingement syndrome of right shoulder.    Onset Date/Surgical Date  06/23/18    Next MD Visit  08/12/18 for L shld MRI results                   [facility] Adult PT Treatment/Exercise - 08/06/18 0001      Exercises   Exercises  Shoulder      Shoulder Exercises: Isometric Strengthening   Flexion  Other (comment)   5" hold x10   Extension  Other (comment)   5" hold x10   ABduction  Other (comment)   5" hold x10   ADduction  Other (comment)   5" hold x10  Modalities   Modalities  Sports coach Action  IFC    Electrical Stimulation Parameters  80-150 hz x15 min    Electrical Stimulation Goals  Pain      Vasopneumatic   Number Minutes Vasopneumatic   15 minutes    Vasopnuematic Location   Shoulder    Vasopneumatic Pressure  Medium      Manual Therapy   Manual Therapy  Passive ROM;Joint mobilization    Joint Mobilization  right shoulder LAD and grade 1 and 2 posterior joint mobs to improve joint mobility    Passive ROM  PROM of R shoulder into flexion, ER, IR with oscillations and gentle holds at end range, then rhythmic stabs for ER/IR and flex/ext at 90 degrees                  PT Long Term Goals - 07/27/18 0817      PT LONG TERM GOAL #1   Title  Independent with a HEP.    Time  4    Period  Weeks    Status  Achieved      PT LONG TERM GOAL #2   Title  Active right shoulder flexion to 145 degrees so the patient can easily reach overhead.    Time   4    Period  Weeks    Status  On-going   AROM 136 degrees 07/27/18     PT LONG TERM GOAL #3   Title  Active ER to 80 degrees+ to allow for easily donning/doffing of apparel.    Time  4    Period  Weeks    Status  On-going   AROM 62 degrees 07/27/18     PT LONG TERM GOAL #4   Title  Increase ROM so patient is able to reach behind back to L3.    Time  4    Period  Weeks    Status  --   NT 07/27/18     PT LONG TERM GOAL #5   Title  Increase right shoulder strength to a solid 4+/5 to increase stability for performance of functional activities.    Time  4    Period  Weeks    Status  On-going   NT 07/27/18     PT LONG TERM GOAL #6   Title  Perform ADL's with right shoulder pain not > 2/10.    Time  4    Period  Weeks    Status  On-going            Plan - 08/06/18 0809    Clinical Impression Statement  Patient was able to tolerate isometric exercises with no reports of increased pain. Patient reported sharp pain with end range IR but reported it dissipated after. Patient instructed to add isometric abd and add to strengthen shoulder. Patient reported agreemeent. Normal response to modalities at end of session.     Clinical Presentation  Stable    Clinical Decision Making  Low    Rehab Potential  Excellent    PT Frequency  3x / week    PT Duration  4 weeks    PT Treatment/Interventions  ADLs/Self Care Home Management;Cryotherapy;Electrical Stimulation;Ultrasound;Moist Heat;Therapeutic activities;Therapeutic exercise;Patient/family education;Neuromuscular re-education;Manual techniques;Passive range of motion;Vasopneumatic Device    PT Next Visit Plan  cont with ROM then progression to PRE's.  Vaso/E-stim PRN for pain relief  Consulted and Agree with Plan of Care  Patient       Patient will benefit from skilled therapeutic intervention in order to improve the following deficits and impairments:  Decreased activity tolerance, Pain, Decreased range of motion  Visit  Diagnosis: Acute pain of right shoulder  Stiffness of right shoulder, not elsewhere classified     Problem List Patient Active Problem List   Diagnosis Date Noted  . Bilateral hip pain 10/24/2017  . Shoulder pain, bilateral 10/24/2017  . Arthritis 10/24/2017  . Osteoarthritis of both hips 10/24/2017  . Avascular necrosis of bone of hip, right ([place]) 10/24/2017  . 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 PTSD (post-traumatic stress disorder) 08/20/2017  . Tardive dyskinesia 08/20/2017  . Severe major depression ([place]) 05/10/2017  . Affective psychosis, bipolar ([place]) 05/10/2017  . Vision disturbance 05/06/2017  . History of concussion 02/10/2017  . Mood disorder ([place]) 02/10/2017  . Adjustment insomnia 01/24/2017  . Adjustment disorder with anxious mood 01/24/2017  . GAD (generalized anxiety disorder) 01/24/2017  . Gastroesophageal reflux disease without esophagitis 11/19/2016  . Hypothyroidism 11/19/2016  . DDD (degenerative disc disease), lumbar 11/19/2016   Gabriela Eves, PT, DPT 08/06/2018, 9:10 AM  [facility] [place], [place], [address] Phone: [phone]   Fax:  [phone]  Name: Marvin Espinoza MRN: [id number] Date of Birth: 05/19/1971

## 2018-08-07 ENCOUNTER — Ambulatory Visit: Payer: BLUE CROSS/BLUE SHIELD | Admitting: Physical Therapy

## 2018-08-07 DIAGNOSIS — M25511 Pain in right shoulder: Secondary | ICD-10-CM

## 2018-08-07 DIAGNOSIS — M25611 Stiffness of right shoulder, not elsewhere classified: Secondary | ICD-10-CM

## 2018-08-07 NOTE — Therapy (Signed)
[facility], [place], [address] Phone: [phone]   Fax:  [phone]  Physical Therapy Treatment  Patient Details  Name: Marvin Espinoza MRN: [id number] Date of Birth: Jun 15, 1971 Referring Provider: Justice Britain MD.   Encounter Date: 08/07/2018  PT End of Session - 08/07/18 0734    Visit Number  13    Number of Visits  24    Date for PT Re-Evaluation  09/03/18    Authorization Type  FOTO AT LEAST EVERY 5TH VISIT, Progress note every 10th visit     PT Start Time  0730    PT Stop Time  0826    PT Time Calculation (min)  56 min    Activity Tolerance  Patient tolerated treatment well    Behavior During Therapy  [facility] for tasks assessed/performed       Past Medical History:  Diagnosis Date  . Anxiety   . Bipolar disorder ([place])   . CTS (carpal tunnel syndrome)   . Depression   . GERD (gastroesophageal reflux disease)   . H/O bariatric surgery   . Head injury 05/06/2017  . Hypothyroidism   . Hypothyroidism   . Sleep apnea    no need for CPAP at this time  . Sleep apnea     Past Surgical History:  Procedure Laterality Date  . ANAL FISSURE REPAIR    . BARIATRIC SURGERY    . CARPAL TUNNEL RELEASE Left 05/09/2015   Procedure: LEFT CARPAL TUNNEL RELEASE;  Surgeon: Daryll Brod, MD;  Location: [place];  Service: Orthopedics;  Laterality: Left;  . CARPAL TUNNEL RELEASE Left 05-09-2015  . CARPAL TUNNEL RELEASE Right 06/20/2015   Procedure: RIGHT CARPAL TUNNEL RELEASE;  Surgeon: Daryll Brod, MD;  Location: [place];  Service: Orthopedics;  Laterality: Right;  REGIONAL/FAB  . CHOLECYSTECTOMY    . COLONOSCOPY    . UPPER GASTROINTESTINAL ENDOSCOPY      There were no vitals filed for this visit.  Subjective Assessment - 08/07/18 0736    Subjective  Patient reported increased pain from right shoulder to mid humerus when his arms are placed behind his back.    Pertinent History  Head  injury.    Patient Stated Goals  Use right arm without pain.    Currently in Pain?  Yes    Pain Score  7     Pain Location  Shoulder    Pain Orientation  Right    Pain Descriptors / Indicators  Discomfort    Pain Type  Surgical pain    Pain Onset  More than a month ago         [facility] PT Assessment - 08/07/18 0001      Assessment   Medical Diagnosis  Impingement syndrome of right shoulder.    Onset Date/Surgical Date  06/23/18    Next MD Visit  08/12/18 for L shld MRI results                   [facility] Adult PT Treatment/Exercise - 08/07/18 0001      Exercises   Exercises  Shoulder      Shoulder Exercises: Supine   Other Supine Exercises  ABCs capital and lower case 1x each      Shoulder Exercises: Standing   Flexion  AAROM;Right;20 reps    Other Standing Exercises  AAROM scaption with PVC x20    Other Standing Exercises  AROM bent over row x20  Shoulder Exercises: Pulleys   Flexion  5 minutes      Shoulder Exercises: Isometric Strengthening   Flexion  5X10"    Extension  5X10"    External Rotation  5X10"    Internal Rotation  5X10"      Modalities   Modalities  Electrical Stimulation;Vasopneumatic      Electrical Stimulation   Electrical Stimulation Location  R shoulder    Electrical Stimulation Action  IFC    Electrical Stimulation Parameters  80-150 hz x15 min    Electrical Stimulation Goals  Pain      Vasopneumatic   Number Minutes Vasopneumatic   15 minutes    Vasopnuematic Location   Shoulder    Vasopneumatic Pressure  Medium    Vasopneumatic Temperature   34      Manual Therapy   Manual Therapy  Passive ROM;Joint mobilization    Joint Mobilization  right shoulder LAD and grade 1 and 2 posterior joint mobs to improve joint mobility    Passive ROM  PROM of R shoulder into flexion, ER, IR with oscillations to promote muscle relaxation             PT Education - 08/07/18 0835    Education Details  AAROM scaption AAROM flexion     Person(s) Educated  Patient    Methods  Explanation;Demonstration;Handout    Comprehension  Verbalized understanding;Returned demonstration          PT Long Term Goals - 07/27/18 0817      PT LONG TERM GOAL #1   Title  Independent with a HEP.    Time  4    Period  Weeks    Status  Achieved      PT LONG TERM GOAL #2   Title  Active right shoulder flexion to 145 degrees so the patient can easily reach overhead.    Time  4    Period  Weeks    Status  On-going   AROM 136 degrees 07/27/18     PT LONG TERM GOAL #3   Title  Active ER to 80 degrees+ to allow for easily donning/doffing of apparel.    Time  4    Period  Weeks    Status  On-going   AROM 62 degrees 07/27/18     PT LONG TERM GOAL #4   Title  Increase ROM so patient is able to reach behind back to L3.    Time  4    Period  Weeks    Status  --   NT 07/27/18     PT LONG TERM GOAL #5   Title  Increase right shoulder strength to a solid 4+/5 to increase stability for performance of functional activities.    Time  4    Period  Weeks    Status  On-going   NT 07/27/18     PT LONG TERM GOAL #6   Title  Perform ADL's with right shoulder pain not > 2/10.    Time  4    Period  Weeks    Status  On-going            Plan - 08/07/18 4854    Clinical Impression Statement  Patient was able to tolerate treatment despite increase of pain at start of session. Patient was able to demonstrate good form with new AAROM exercises. Patient was educated to go to pain and not beyond it with all exercises. Patient reported understanding. Normal response to modalities upon  removal. HEP provided to which patient reported understanding.    Clinical Presentation  Stable    Clinical Decision Making  Low    Rehab Potential  Excellent    PT Frequency  3x / week    PT Duration  4 weeks    PT Treatment/Interventions  ADLs/Self Care Home Management;Cryotherapy;Electrical Stimulation;Ultrasound;Moist Heat;Therapeutic activities;Therapeutic  exercise;Patient/family education;Neuromuscular re-education;Manual techniques;Passive range of motion;Vasopneumatic Device    PT Next Visit Plan  cont with ROM then progression to PRE's.  Vaso/E-stim PRN for pain relief    Consulted and Agree with Plan of Care  Patient       Patient will benefit from skilled therapeutic intervention in order to improve the following deficits and impairments:  Decreased activity tolerance, Pain, Decreased range of motion  Visit Diagnosis: Acute pain of right shoulder  Stiffness of right shoulder, not elsewhere classified     Problem List Patient Active Problem List   Diagnosis Date Noted  . Bilateral hip pain 10/24/2017  . Shoulder pain, bilateral 10/24/2017  . Arthritis 10/24/2017  . Osteoarthritis of both hips 10/24/2017  . Avascular necrosis of bone of hip, right ([place]) 10/24/2017  . 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 PTSD (post-traumatic stress disorder) 08/20/2017  . Tardive dyskinesia 08/20/2017  . Severe major depression ([place]) 05/10/2017  . Affective psychosis, bipolar ([place]) 05/10/2017  . Vision disturbance 05/06/2017  . History of concussion 02/10/2017  . Mood disorder ([place]) 02/10/2017  . Adjustment insomnia 01/24/2017  . Adjustment disorder with anxious mood 01/24/2017  . GAD (generalized anxiety disorder) 01/24/2017  . Gastroesophageal reflux disease without esophagitis 11/19/2016  . Hypothyroidism 11/19/2016  . DDD (degenerative disc disease), lumbar 11/19/2016   Gabriela Eves, PT, DPT 08/07/2018, 8:58 AM  [facility] [address] [place], [place], [address] Phone: [phone]   Fax:  [phone]   Name: Marvin Espinoza MRN: [id number] Date of Birth: January 17, 1971

## 2018-08-10 ENCOUNTER — Ambulatory Visit: Payer: BLUE CROSS/BLUE SHIELD | Admitting: Physical Therapy

## 2018-08-10 ENCOUNTER — Encounter: Payer: Self-pay | Admitting: Physical Therapy

## 2018-08-10 DIAGNOSIS — M25511 Pain in right shoulder: Secondary | ICD-10-CM

## 2018-08-10 DIAGNOSIS — M25611 Stiffness of right shoulder, not elsewhere classified: Secondary | ICD-10-CM

## 2018-08-10 NOTE — Therapy (Signed)
[facility] [address] [place], [place], [address] Phone: [phone]   Fax:  [phone]  Physical Therapy Treatment  Patient Details  Name: Marvin Espinoza MRN: [id number] Date of Birth: 10/24/1971 Referring Provider: Kevin Supple MD.   Encounter Date: 08/10/2018  PT End of Session - 08/10/18 0820    Visit Number  14    Number of Visits  24    Date for PT Re-Evaluation  09/03/18    Authorization Type  FOTO AT LEAST EVERY 5TH VISIT, Progress note every 10th visit     PT Start Time  0730    PT Stop Time  0828    PT Time Calculation (min)  58 min    Activity Tolerance  Patient tolerated treatment well    Behavior During Therapy  [facility] for tasks assessed/performed       Past Medical History:  Diagnosis Date  . Anxiety   . Bipolar disorder ([facility])   . CTS (carpal tunnel syndrome)   . Depression   . GERD (gastroesophageal reflux disease)   . H/O bariatric surgery   . Head injury 05/06/2017  . Hypothyroidism   . Hypothyroidism   . Sleep apnea    no need for CPAP at this time  . Sleep apnea     Past Surgical History:  Procedure Laterality Date  . ANAL FISSURE REPAIR    . BARIATRIC SURGERY    . CARPAL TUNNEL RELEASE Left 05/09/2015   Procedure: LEFT CARPAL TUNNEL RELEASE;  Surgeon: Gary Kuzma, MD;  Location: [facility];  Service: Orthopedics;  Laterality: Left;  . CARPAL TUNNEL RELEASE Left 05-09-2015  . CARPAL TUNNEL RELEASE Right 06/20/2015   Procedure: RIGHT CARPAL TUNNEL RELEASE;  Surgeon: Gary Kuzma, MD;  Location: [facility];  Service: Orthopedics;  Laterality: Right;  REGIONAL/FAB  . CHOLECYSTECTOMY    . COLONOSCOPY    . UPPER GASTROINTESTINAL ENDOSCOPY      There were no vitals filed for this visit.  Subjective Assessment - 08/10/18 0736    Subjective  Patient arrived with increased discomfort after weedeating the yard, otherwise shoulder has been improving    Pertinent History  Head injury.     Patient Stated Goals  Use right arm without pain.    Currently in Pain?  Yes    Pain Score  7     Pain Location  Shoulder    Pain Orientation  Right    Pain Descriptors / Indicators  Discomfort    Pain Type  Surgical pain    Pain Onset  More than a month ago    Pain Frequency  Intermittent    Aggravating Factors   increased activity ADL's    Pain Relieving Factors  at rest         [facility] PT Assessment - 08/10/18 0001      AROM   Right/Left Shoulder  Right    Right Shoulder Flexion  160 Degrees    Right Shoulder Internal Rotation  --   T12   Right Shoulder External Rotation  75 Degrees      PROM   PROM Assessment Site  Shoulder    Right/Left Shoulder  Right    Right Shoulder External Rotation  80 Degrees                   [facility] Adult PT Treatment/Exercise - 08/10/18 0001      Exercises   Exercises  Shoulder        Shoulder Exercises: Prone   Other Prone Exercises  kneeling for rows /ext 3x10 each      Shoulder Exercises: Standing   Flexion  AAROM;Right;20 reps    Other Standing Exercises  AROM for flexion /scaption 3x10 each      Shoulder Exercises: Pulleys   Flexion  5 minutes    Other Pulley Exercises  UE ranger for elevation and cirles x20 each      Shoulder Exercises: ROM/Strengthening   Other ROM/Strengthening Exercises  wall wash x20 each way      Electrical Stimulation   Electrical Stimulation Location  R shoulder    Electrical Stimulation Action  IFC    Electrical Stimulation Parameters  80-150hz x15mn    Electrical Stimulation Goals  Pain      Vasopneumatic   Number Minutes Vasopneumatic   15 minutes    Vasopnuematic Location   Shoulder    Vasopneumatic Pressure  Medium      Manual Therapy   Manual Therapy  Passive ROM    Manual therapy comments  rhythmic stabs for IR/ER in scaption and flex/ext at 90 degrees    Passive ROM  PROM of R shoulder into flexion, ER, IR with oscillations to promote muscle relaxation                   PT Long Term Goals - 08/10/18 0745      PT LONG TERM GOAL #1   Title  Independent with a HEP.    Time  4    Period  Weeks    Status  Achieved      PT LONG TERM GOAL #2   Title  Active right shoulder flexion to 145 degrees so the patient can easily reach overhead.    Time  4    Period  Weeks    Status  Achieved   MET 08/10/18 AROM 160 degrees     PT LONG TERM GOAL #3   Title  Active ER to 80 degrees+ to allow for easily donning/doffing of apparel.    Status  On-going   AROM 75 degees 08/10/18     PT LONG TERM GOAL #4   Title  Increase ROM so patient is able to reach behind back to L3.    Time  4    Period  Weeks    Status  Achieved   AROM T12 08/10/18     PT LONG TERM GOAL #5   Title  Increase right shoulder strength to a solid 4+/5 to increase stability for performance of functional activities.    Time  4    Period  Weeks    Status  On-going   NT 08/10/18     PT LONG TERM GOAL #6   Title  Perform ADL's with right shoulder pain not > 2/10.    Time  4    Period  Weeks    Status  On-going   7/10 08/10/18           Plan - 08/10/18 02536   Clinical Impression Statement  Patient tolerated treatment well today with no increasd discomfort. Patient able to progress with gentle AROM exercises with no difficulty. Patient has overall improved ROM in right shoulder for flexion and ER/IR actve and passively. Patient met LTG #2 and #4 today with strenth and pain deficts.     Rehab Potential  Excellent    PT Frequency  3x / week    PT Duration  4 weeks  PT Treatment/Interventions  ADLs/Self Care Home Management;Cryotherapy;Electrical Stimulation;Ultrasound;Moist Heat;Therapeutic activities;Therapeutic exercise;Patient/family education;Neuromuscular re-education;Manual techniques;Passive range of motion;Vasopneumatic Device    PT Next Visit Plan  F/U with MD. Supple tomorrow /will progress per MD discression    Consulted and Agree with Plan of Care   Patient       Patient will benefit from skilled therapeutic intervention in order to improve the following deficits and impairments:  Decreased activity tolerance, Pain, Decreased range of motion  Visit Diagnosis: Acute pain of right shoulder  Stiffness of right shoulder, not elsewhere classified     Problem List Patient Active Problem List   Diagnosis Date Noted  . Bilateral hip pain 10/24/2017  . Shoulder pain, bilateral 10/24/2017  . Arthritis 10/24/2017  . Osteoarthritis of both hips 10/24/2017  . Avascular necrosis of bone of hip, right ([facility]) 10/24/2017  . 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 PTSD (post-traumatic stress disorder) 08/20/2017  . Tardive dyskinesia 08/20/2017  . Severe major depression ([facility]) 05/10/2017  . Affective psychosis, bipolar ([facility]) 05/10/2017  . Vision disturbance 05/06/2017  . History of concussion 02/10/2017  . Mood disorder ([facility]) 02/10/2017  . Adjustment insomnia 01/24/2017  . Adjustment disorder with anxious mood 01/24/2017  . GAD (generalized anxiety disorder) 01/24/2017  . Gastroesophageal reflux disease without esophagitis 11/19/2016  . Hypothyroidism 11/19/2016  . DDD (degenerative disc disease), lumbar 11/19/2016     , PTA 08/10/18 8:33 AM  [facility] [address] [place], [place], [address] Phone: [phone]   Fax:  [phone]  Name: Dardan Mcwhirter MRN: [id number] Date of Birth: 04/23/1971   

## 2018-08-13 ENCOUNTER — Ambulatory Visit: Payer: BLUE CROSS/BLUE SHIELD | Admitting: Physical Therapy

## 2018-08-13 ENCOUNTER — Other Ambulatory Visit: Payer: Self-pay | Admitting: Physician Assistant

## 2018-08-13 ENCOUNTER — Encounter: Payer: Self-pay | Admitting: Physical Therapy

## 2018-08-13 DIAGNOSIS — M25511 Pain in right shoulder: Secondary | ICD-10-CM

## 2018-08-13 DIAGNOSIS — M25611 Stiffness of right shoulder, not elsewhere classified: Secondary | ICD-10-CM

## 2018-08-13 NOTE — Therapy (Signed)
[facility] [place], [place], [address] Phone: [phone]   Fax:  [phone]  Physical Therapy Treatment/Discharge  Patient Details  Name: Marvin Espinoza MRN: [id number] Date of Birth: June 10, 1971 Referring Provider: Justice Britain MD.   Encounter Date: 08/13/2018  PT End of Session - 08/13/18 0740    Visit Number  15    Number of Visits  24    Date for PT Re-Evaluation  09/03/18    Authorization Type  FOTO AT LEAST EVERY 5TH VISIT, Progress note every 10th visit     PT Start Time  0730    PT Stop Time  0828    PT Time Calculation (min)  58 min    Activity Tolerance  Patient tolerated treatment well    Behavior During Therapy  [facility] for tasks assessed/performed       Past Medical History:  Diagnosis Date  . Anxiety   . Bipolar disorder ([place])   . CTS (carpal tunnel syndrome)   . Depression   . GERD (gastroesophageal reflux disease)   . H/O bariatric surgery   . Head injury 05/06/2017  . Hypothyroidism   . Hypothyroidism   . Sleep apnea    no need for CPAP at this time  . Sleep apnea     Past Surgical History:  Procedure Laterality Date  . ANAL FISSURE REPAIR    . BARIATRIC SURGERY    . CARPAL TUNNEL RELEASE Left 05/09/2015   Procedure: LEFT CARPAL TUNNEL RELEASE;  Surgeon: Daryll Brod, MD;  Location: [place];  Service: Orthopedics;  Laterality: Left;  . CARPAL TUNNEL RELEASE Left 05-09-2015  . CARPAL TUNNEL RELEASE Right 06/20/2015   Procedure: RIGHT CARPAL TUNNEL RELEASE;  Surgeon: Daryll Brod, MD;  Location: [place];  Service: Orthopedics;  Laterality: Right;  REGIONAL/FAB  . CHOLECYSTECTOMY    . COLONOSCOPY    . UPPER GASTROINTESTINAL ENDOSCOPY      There were no vitals filed for this visit.  Subjective Assessment - 08/13/18 0738    Subjective  Patient reported he got approved for surgery for left shoulder and requested HEP for right shoulder as he would like to save his  visits for left shoulder physical therapy post surgery.    Pertinent History  Head injury.    Patient Stated Goals  Use right arm without pain.    Currently in Pain?  Yes    Pain Score  4     Pain Location  Shoulder    Pain Orientation  Right    Pain Descriptors / Indicators  Discomfort    Pain Type  Surgical pain    Pain Onset  More than a month ago    Pain Frequency  Intermittent         [facility] PT Assessment - 08/13/18 0001      Assessment   Medical Diagnosis  Impingement syndrome of right shoulder.    Onset Date/Surgical Date  06/23/18    Next MD Visit  08/12/18 for L shld MRI results      Observation/Other Assessments   Focus on Therapeutic Outcomes (FOTO)   38% limitation                   [facility] Adult PT Treatment/Exercise - 08/13/18 0001      Exercises   Exercises  Shoulder      Shoulder Exercises: Standing   Other Standing Exercises  RW4 yellow TBand x20 each  Shoulder Exercises: Pulleys   Flexion  5 minutes    Other Pulley Exercises  wall circles flexion cw, ccw circles 2 minutes each      Shoulder Exercises: ROM/Strengthening   Wall Pushups  20 reps   with push up plus     Modalities   Modalities  Electrical Stimulation;Vasopneumatic      Electrical Stimulation   Electrical Stimulation Location  right shoulder    Electrical Stimulation Action  IFC    Electrical Stimulation Parameters  80-150 hz x15 min    Electrical Stimulation Goals  Pain      Vasopneumatic   Number Minutes Vasopneumatic   15 minutes    Vasopnuematic Location   Shoulder    Vasopneumatic Pressure  Medium                  PT Long Term Goals - 08/13/18 0845      PT LONG TERM GOAL #1   Title  Independent with a HEP.    Time  4    Period  Weeks    Status  Achieved      PT LONG TERM GOAL #2   Title  Active right shoulder flexion to 145 degrees so the patient can easily reach overhead.    Time  4    Period  Weeks    Status  Achieved      PT LONG TERM  GOAL #3   Title  Active ER to 80 degrees+ to allow for easily donning/doffing of apparel.    Time  4    Period  Weeks    Status  Not Met      PT LONG TERM GOAL #4   Title  Increase ROM so patient is able to reach behind back to L3.    Time  4    Period  Weeks      PT LONG TERM GOAL #5   Title  Increase right shoulder strength to a solid 4+/5 to increase stability for performance of functional activities.    Time  4    Period  Weeks    Status  Not Met      PT LONG TERM GOAL #6   Title  Perform ADL's with right shoulder pain not > 2/10.    Time  4    Period  Weeks    Status  Not Met            Plan - 08/13/18 0835    Clinical Impression Statement  Patient was able to tolerate progression of treatment well with some reports of muscle fatigue. Throughout session patient was educated on proper form of all exercises provided on HEP. Patient reported understanding. Normal response to modalities at end of session. Patient to be discharge at this time.    Clinical Presentation  Stable    Clinical Decision Making  Low    Rehab Potential  Excellent    PT Frequency  3x / week    PT Duration  4 weeks    PT Treatment/Interventions  ADLs/Self Care Home Management;Cryotherapy;Electrical Stimulation;Ultrasound;Moist Heat;Therapeutic activities;Therapeutic exercise;Patient/family education;Neuromuscular re-education;Manual techniques;Passive range of motion;Vasopneumatic Device    PT Next Visit Plan  DC.    Consulted and Agree with Plan of Care  Patient       Patient will benefit from skilled therapeutic intervention in order to improve the following deficits and impairments:  Decreased activity tolerance, Pain, Decreased range of motion  Visit Diagnosis: Acute pain of right shoulder  Stiffness of right shoulder, not elsewhere classified     Problem List Patient Active Problem List   Diagnosis Date Noted  . Bilateral hip pain 10/24/2017  . Shoulder pain, bilateral 10/24/2017   . Arthritis 10/24/2017  . Osteoarthritis of both hips 10/24/2017  . Avascular necrosis of bone of hip, right ([place]) 10/24/2017  . 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 PTSD (post-traumatic stress disorder) 08/20/2017  . Tardive dyskinesia 08/20/2017  . Severe major depression ([place]) 05/10/2017  . Affective psychosis, bipolar ([place]) 05/10/2017  . Vision disturbance 05/06/2017  . History of concussion 02/10/2017  . Mood disorder ([place]) 02/10/2017  . Adjustment insomnia 01/24/2017  . Adjustment disorder with anxious mood 01/24/2017  . GAD (generalized anxiety disorder) 01/24/2017  . Gastroesophageal reflux disease without esophagitis 11/19/2016  . Hypothyroidism 11/19/2016  . DDD (degenerative disc disease), lumbar 11/19/2016    PHYSICAL THERAPY DISCHARGE SUMMARY  Visits from Start of Care: 15  Current functional level related to goals / functional outcomes: See above   Remaining deficits: See goals   Education / Equipment: HEP and yellow theraband Plan: Patient agrees to discharge.  Patient goals were partially met. Patient is being discharged due to the patient's request.  ?????       Gabriela Eves, PT, DPT 08/13/2018, 10:07 AM  [facility] [place], [place], [address] Phone: [phone]   Fax:  [phone]  Name: Marvin Espinoza MRN: [id number] Date of Birth: Jan 16, 1971

## 2018-08-14 ENCOUNTER — Encounter: Payer: Self-pay | Admitting: Physical Therapy

## 2018-08-21 DIAGNOSIS — S43432A Superior glenoid labrum lesion of left shoulder, initial encounter: Secondary | ICD-10-CM | POA: Diagnosis not present

## 2018-08-21 DIAGNOSIS — M19012 Primary osteoarthritis, left shoulder: Secondary | ICD-10-CM | POA: Diagnosis not present

## 2018-08-21 DIAGNOSIS — G8918 Other acute postprocedural pain: Secondary | ICD-10-CM | POA: Diagnosis not present

## 2018-08-21 DIAGNOSIS — M24112 Other articular cartilage disorders, left shoulder: Secondary | ICD-10-CM | POA: Diagnosis not present

## 2018-08-21 DIAGNOSIS — M7542 Impingement syndrome of left shoulder: Secondary | ICD-10-CM | POA: Diagnosis not present

## 2018-08-24 DIAGNOSIS — M25512 Pain in left shoulder: Secondary | ICD-10-CM | POA: Diagnosis not present

## 2018-08-24 DIAGNOSIS — M19012 Primary osteoarthritis, left shoulder: Secondary | ICD-10-CM | POA: Diagnosis not present

## 2018-08-24 DIAGNOSIS — R600 Localized edema: Secondary | ICD-10-CM | POA: Diagnosis not present

## 2018-08-24 DIAGNOSIS — Z4889 Encounter for other specified surgical aftercare: Secondary | ICD-10-CM | POA: Diagnosis not present

## 2018-08-25 DIAGNOSIS — M5136 Other intervertebral disc degeneration, lumbar region: Secondary | ICD-10-CM | POA: Diagnosis not present

## 2018-08-25 DIAGNOSIS — M19012 Primary osteoarthritis, left shoulder: Secondary | ICD-10-CM | POA: Diagnosis not present

## 2018-08-25 DIAGNOSIS — Z4889 Encounter for other specified surgical aftercare: Secondary | ICD-10-CM | POA: Diagnosis not present

## 2018-08-25 DIAGNOSIS — M545 Low back pain: Secondary | ICD-10-CM | POA: Diagnosis not present

## 2018-08-25 DIAGNOSIS — R600 Localized edema: Secondary | ICD-10-CM | POA: Diagnosis not present

## 2018-08-26 DIAGNOSIS — Z4889 Encounter for other specified surgical aftercare: Secondary | ICD-10-CM | POA: Diagnosis not present

## 2018-08-26 DIAGNOSIS — M19012 Primary osteoarthritis, left shoulder: Secondary | ICD-10-CM | POA: Diagnosis not present

## 2018-08-26 DIAGNOSIS — R600 Localized edema: Secondary | ICD-10-CM | POA: Diagnosis not present

## 2018-08-27 DIAGNOSIS — R600 Localized edema: Secondary | ICD-10-CM | POA: Diagnosis not present

## 2018-08-27 DIAGNOSIS — F3111 Bipolar disorder, current episode manic without psychotic features, mild: Secondary | ICD-10-CM | POA: Diagnosis not present

## 2018-08-27 DIAGNOSIS — M19012 Primary osteoarthritis, left shoulder: Secondary | ICD-10-CM | POA: Diagnosis not present

## 2018-08-27 DIAGNOSIS — F3131 Bipolar disorder, current episode depressed, mild: Secondary | ICD-10-CM | POA: Diagnosis not present

## 2018-08-27 DIAGNOSIS — F4310 Post-traumatic stress disorder, unspecified: Secondary | ICD-10-CM | POA: Diagnosis not present

## 2018-08-27 DIAGNOSIS — Z4889 Encounter for other specified surgical aftercare: Secondary | ICD-10-CM | POA: Diagnosis not present

## 2018-08-28 ENCOUNTER — Other Ambulatory Visit: Payer: Self-pay | Admitting: Physician Assistant

## 2018-08-28 DIAGNOSIS — M5136 Other intervertebral disc degeneration, lumbar region: Secondary | ICD-10-CM

## 2018-08-28 DIAGNOSIS — M19012 Primary osteoarthritis, left shoulder: Secondary | ICD-10-CM | POA: Diagnosis not present

## 2018-08-28 DIAGNOSIS — Z4889 Encounter for other specified surgical aftercare: Secondary | ICD-10-CM | POA: Diagnosis not present

## 2018-08-28 DIAGNOSIS — R600 Localized edema: Secondary | ICD-10-CM | POA: Diagnosis not present

## 2018-08-29 DIAGNOSIS — Z4889 Encounter for other specified surgical aftercare: Secondary | ICD-10-CM | POA: Diagnosis not present

## 2018-08-29 DIAGNOSIS — M19012 Primary osteoarthritis, left shoulder: Secondary | ICD-10-CM | POA: Diagnosis not present

## 2018-08-29 DIAGNOSIS — R600 Localized edema: Secondary | ICD-10-CM | POA: Diagnosis not present

## 2018-08-30 DIAGNOSIS — M19012 Primary osteoarthritis, left shoulder: Secondary | ICD-10-CM | POA: Diagnosis not present

## 2018-08-30 DIAGNOSIS — R600 Localized edema: Secondary | ICD-10-CM | POA: Diagnosis not present

## 2018-08-30 DIAGNOSIS — Z4889 Encounter for other specified surgical aftercare: Secondary | ICD-10-CM | POA: Diagnosis not present

## 2018-08-31 DIAGNOSIS — M19012 Primary osteoarthritis, left shoulder: Secondary | ICD-10-CM | POA: Diagnosis not present

## 2018-08-31 DIAGNOSIS — R600 Localized edema: Secondary | ICD-10-CM | POA: Diagnosis not present

## 2018-08-31 DIAGNOSIS — Z4889 Encounter for other specified surgical aftercare: Secondary | ICD-10-CM | POA: Diagnosis not present

## 2018-09-01 DIAGNOSIS — Z4889 Encounter for other specified surgical aftercare: Secondary | ICD-10-CM | POA: Diagnosis not present

## 2018-09-01 DIAGNOSIS — M19012 Primary osteoarthritis, left shoulder: Secondary | ICD-10-CM | POA: Diagnosis not present

## 2018-09-01 DIAGNOSIS — R600 Localized edema: Secondary | ICD-10-CM | POA: Diagnosis not present

## 2018-09-02 DIAGNOSIS — Z4889 Encounter for other specified surgical aftercare: Secondary | ICD-10-CM | POA: Diagnosis not present

## 2018-09-02 DIAGNOSIS — R600 Localized edema: Secondary | ICD-10-CM | POA: Diagnosis not present

## 2018-09-02 DIAGNOSIS — M19012 Primary osteoarthritis, left shoulder: Secondary | ICD-10-CM | POA: Diagnosis not present

## 2018-09-03 ENCOUNTER — Other Ambulatory Visit: Payer: Self-pay

## 2018-09-03 ENCOUNTER — Ambulatory Visit: Payer: BLUE CROSS/BLUE SHIELD | Attending: Orthopedic Surgery | Admitting: Physical Therapy

## 2018-09-03 ENCOUNTER — Encounter: Payer: Self-pay | Admitting: Physical Therapy

## 2018-09-03 DIAGNOSIS — M25512 Pain in left shoulder: Secondary | ICD-10-CM | POA: Insufficient documentation

## 2018-09-03 DIAGNOSIS — M6281 Muscle weakness (generalized): Secondary | ICD-10-CM | POA: Diagnosis not present

## 2018-09-03 DIAGNOSIS — Z4889 Encounter for other specified surgical aftercare: Secondary | ICD-10-CM | POA: Diagnosis not present

## 2018-09-03 DIAGNOSIS — M25612 Stiffness of left shoulder, not elsewhere classified: Secondary | ICD-10-CM | POA: Insufficient documentation

## 2018-09-03 DIAGNOSIS — M19012 Primary osteoarthritis, left shoulder: Secondary | ICD-10-CM | POA: Diagnosis not present

## 2018-09-03 DIAGNOSIS — R600 Localized edema: Secondary | ICD-10-CM | POA: Diagnosis not present

## 2018-09-03 NOTE — Therapy (Addendum)
[facility] [place], [place], [address] Phone: [phone]   Fax:  [phone]  Physical Therapy Evaluation  Patient Details  Name: Marvin Espinoza MRN: [id number] Date of Birth: Oct 21, 1971 Referring Provider: Justice Britain, MD   Encounter Date: 09/03/2018  PT End of Session - 09/03/18 0941    Visit Number  1    Number of Visits  12    Date for PT Re-Evaluation  10/08/18    Authorization Type  FOTO every 5th visit, progress note every 10th visit    PT Start Time  0819    PT Stop Time  0908    PT Time Calculation (min)  49 min    Activity Tolerance  Patient tolerated treatment well    Behavior During Therapy  [facility] for tasks assessed/performed       Past Medical History:  Diagnosis Date  . Anxiety   . Bipolar disorder ([place])   . CTS (carpal tunnel syndrome)   . Depression   . GERD (gastroesophageal reflux disease)   . H/O bariatric surgery   . Head injury 05/06/2017  . Hypothyroidism   . Hypothyroidism   . Sleep apnea    no need for CPAP at this time  . Sleep apnea     Past Surgical History:  Procedure Laterality Date  . ANAL FISSURE REPAIR    . BARIATRIC SURGERY    . CARPAL TUNNEL RELEASE Left 05/09/2015   Procedure: LEFT CARPAL TUNNEL RELEASE;  Surgeon: Daryll Brod, MD;  Location: [facility];  Service: Orthopedics;  Laterality: Left;  . CARPAL TUNNEL RELEASE Left 05-09-2015  . CARPAL TUNNEL RELEASE Right 06/20/2015   Procedure: RIGHT CARPAL TUNNEL RELEASE;  Surgeon: Daryll Brod, MD;  Location: [facility];  Service: Orthopedics;  Laterality: Right;  REGIONAL/FAB  . CHOLECYSTECTOMY    . COLONOSCOPY    . UPPER GASTROINTESTINAL ENDOSCOPY      There were no vitals filed for this visit.   Subjective Assessment - 09/03/18 0944    Subjective  Patient arrives to physical therapy with reports of left shoulder pain status post left subacromial decompression and Distal clavicle resection on  08/21/18. Patient states he wore his sling for 3 days but stopped wearing it. He has been performing exercises at home which consisted of AAROM flexion with cane, wall circles, shoulder isometrics, and band exercises that he had received his last episode of physical therapy care for his right shoulder. Patient states he has difficulties with AROM but has been trying to do more with the shoulder, but it feels weak. Pain at worst is 7/10 and states he would get a quick sharp pain that would dissipate quickly. Patient's pain at best is 2/10 with rest. 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    Pertinent History  Head injury.    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  Yes    Pain Score  6     Pain Location  Shoulder    Pain Orientation  Left    Pain Descriptors / Indicators  Sore;Discomfort    Pain Type  Surgical pain    Pain Onset  1 to 4 weeks ago    Pain Frequency  Intermittent    Aggravating Factors  lifting it    Pain Relieving Factors  ice    Effect of Pain on Daily Activities  "not working yet"         [facility] PT Assessment - 09/03/18 0001      Assessment   Medical Diagnosis  impringement syndrome of left shoulder; s/p SA. SAD, DCR    Referring Provider  Justice Britain, MD    Onset Date/Surgical Date  08/21/18    Next MD Visit  October 2019    Prior Therapy  yes for right shoulder      Precautions   Precautions  Shoulder      Restrictions   Weight Bearing Restrictions  No      Balance Screen   Has the patient fallen in the past 6 months  No    Has the patient had a decrease in activity level because of a fear of falling?   No    Is the patient reluctant to leave their home because of a fear of falling?   No      Home Environment   Living Environment  Private  residence    Living Arrangements  Spouse/significant other;Children      Prior Function   Level of Independence  Needs assistance with ADLs;Needs assistance with homemaking      Observation/Other Assessments   Focus on Therapeutic Outcomes (FOTO)   40% limited      Posture/Postural Control   Postural Limitations  Forward head;Rounded Shoulders      ROM / Strength   AROM / PROM / Strength  PROM      AROM   Right/Left Shoulder  --      PROM   PROM Assessment Site  Shoulder    Right/Left Shoulder  Left    Left Shoulder Flexion  140 Degrees    Left Shoulder Internal Rotation  --   to abdomen   Left Shoulder External Rotation  43 Degrees      Palpation   Palpation comment  minimal tenderness to palpation along three incisions      Ambulation/Gait   Gait Comments  WNL.                Objective measurements completed on examination: See above findings.      [facility] Adult PT Treatment/Exercise - 09/03/18 0001      Modalities   Modalities  Electrical Stimulation;Vasopneumatic      Electrical Stimulation   Electrical Stimulation Location  Left shoulder    Electrical Stimulation Action  IFC    Electrical Stimulation Parameters  80-150 hz x15 mins    Electrical Stimulation Goals  Pain      Vasopneumatic   Number Minutes Vasopneumatic   15 minutes    Vasopnuematic Location   Shoulder    Vasopneumatic Pressure  Low    Vasopneumatic Temperature   34      Manual Therapy   Manual Therapy  Passive ROM    Passive ROM  PROM of R shoulder into flexion, ER, IR with oscillations to promote muscle relaxation             PT Education - 09/03/18 1245    Education Details  AAROM left shoulder flexion, AAROM left shoulder ER, wall circles    Person(s) Educated  Patient    Methods  Explanation;Demonstration;Handout    Comprehension  Verbalized understanding;Returned demonstration       PT Short Term Goals - 09/03/18 1028      PT SHORT TERM  GOAL #1   Title   STG=LTG        PT Long Term Goals - 09/03/18 1029      PT LONG TERM GOAL #1   Title  Patient will be independent with HEP and its progression.    Time  4    Period  Weeks    Status  New      PT LONG TERM GOAL #2   Title  Patient will demonstrate left shoulder flexion AROM to 155+ degrees or greater so patient can reach overhead.    Time  4    Period  Weeks    Status  New      PT LONG TERM GOAL #3   Title  Patient will demonstrate left shoulder ER AROM to 70+ degrees to don/doff apparel.     Time  4    Period  Weeks    Status  New      PT LONG TERM GOAL #4   Title  Patient will demonstrate left shoulder functional IR to L4 or greater to improve ability to wash back and don/doff belt    Time  4    Period  Weeks    Status  New      PT LONG TERM GOAL #5   Title  Patient will demonstrate left shoulder strength to 4+/5 or greater to improve stability during functional tasks and activities.    Time  4    Period  Weeks    Status  New      PT LONG TERM GOAL #6   Title  ---             Plan - 09/03/18 1027    Clinical Impression Statement  Patient is a right handed, 47 year old male who presents to physical therapy with decreased shoulder PROM and left shoulder pain secondary to left SAD and DCR on 08/21/18. Patient noted with forward head and rounded shoulders. Minimal tenderness along areas of incision and mild bruising at distomedial aspect of the left bicep. Patient and PT discussed performing left shoulder AAROM exercises only at this time to allow for adequate healing and to address ROM deficits. Patient instructed to perform isometrics and strengthening with theraband for his right shoulder only. Patient he was feeling good so he thought it was okay to perform activities. Patient provided with left shoulder HEP and reviewed exercises. Patient reported understanding. Patient would benefit from skilled physical therapy to address deficits and address patient's goals.     Clinical Presentation  Stable    Clinical Decision Making  Low    Rehab Potential  Excellent    PT Frequency  3x / week    PT Duration  4 weeks    PT Treatment/Interventions  ADLs/Self Care Home Management;Cryotherapy;Electrical Stimulation;Ultrasound;Moist Heat;Therapeutic activities;Therapeutic exercise;Patient/family education;Neuromuscular re-education;Manual techniques;Passive range of motion;Vasopneumatic Device    PT Next Visit Plan  Review HEP, PROM to left shoulder, then progress to AAROM, Modalities PRN for pain relief.    Consulted and Agree with Plan of Care  Patient       Patient will benefit from skilled therapeutic intervention in order to improve the following deficits and impairments:  Decreased activity tolerance, Pain, Decreased range of motion, Impaired UE functional use, Postural dysfunction  Visit Diagnosis: Acute pain of left shoulder - Plan: PT plan of care cert/re-cert  Stiffness of left shoulder, not elsewhere classified - Plan: PT plan of care cert/re-cert  Muscle weakness (generalized) - Plan: PT plan  of care cert/re-cert     Problem List Patient Active Problem List   Diagnosis Date Noted  . Bilateral hip pain 10/24/2017  . Shoulder pain, bilateral 10/24/2017  . Arthritis 10/24/2017  . Osteoarthritis of both hips 10/24/2017  . Avascular necrosis of bone of hip, right ([place]) 10/24/2017  . 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 PTSD (post-traumatic stress disorder) 08/20/2017  . Tardive dyskinesia 08/20/2017  . Severe major depression ([place]) 05/10/2017  . Affective psychosis, bipolar ([place]) 05/10/2017  . Vision disturbance 05/06/2017  . History of concussion 02/10/2017  . Mood disorder ([place]) 02/10/2017  . Adjustment insomnia 01/24/2017  . Adjustment disorder with anxious mood 01/24/2017  . GAD (generalized anxiety disorder) 01/24/2017  . Gastroesophageal reflux disease without esophagitis 11/19/2016  . Hypothyroidism 11/19/2016  . DDD (degenerative  disc disease), lumbar 11/19/2016   Gabriela Eves, PT, DPT 09/03/2018, 10:28 PM  [facility] [address] [place], [place], [address] Phone: [phone]   Fax:  [phone]  Name: Marvin Espinoza MRN: [id number] Date of Birth: 08-08-1971

## 2018-09-04 ENCOUNTER — Encounter: Payer: Self-pay | Admitting: Physician Assistant

## 2018-09-04 ENCOUNTER — Ambulatory Visit (INDEPENDENT_AMBULATORY_CARE_PROVIDER_SITE_OTHER): Payer: BLUE CROSS/BLUE SHIELD | Admitting: Physician Assistant

## 2018-09-04 VITALS — BP 139/89 | HR 76 | Temp 98.4°F | Ht 70.0 in | Wt 217.2 lb

## 2018-09-04 DIAGNOSIS — K219 Gastro-esophageal reflux disease without esophagitis: Secondary | ICD-10-CM | POA: Diagnosis not present

## 2018-09-04 DIAGNOSIS — M5136 Other intervertebral disc degeneration, lumbar region: Secondary | ICD-10-CM

## 2018-09-04 DIAGNOSIS — Z4889 Encounter for other specified surgical aftercare: Secondary | ICD-10-CM | POA: Diagnosis not present

## 2018-09-04 DIAGNOSIS — M19012 Primary osteoarthritis, left shoulder: Secondary | ICD-10-CM | POA: Diagnosis not present

## 2018-09-04 DIAGNOSIS — G4733 Obstructive sleep apnea (adult) (pediatric): Secondary | ICD-10-CM

## 2018-09-04 DIAGNOSIS — E039 Hypothyroidism, unspecified: Secondary | ICD-10-CM

## 2018-09-04 DIAGNOSIS — R600 Localized edema: Secondary | ICD-10-CM | POA: Diagnosis not present

## 2018-09-04 MED ORDER — LUBIPROSTONE 24 MCG PO CAPS: ORAL_CAPSULE | ORAL | 11 refills | Status: DC

## 2018-09-04 MED ORDER — ZOLPIDEM TARTRATE 10 MG PO TABS: 10.0000 mg | ORAL_TABLET | Freq: Every evening | ORAL | 5 refills | Status: DC | PRN

## 2018-09-04 MED ORDER — LEVOTHYROXINE SODIUM 100 MCG PO TABS: ORAL_TABLET | ORAL | 5 refills | Status: DC

## 2018-09-04 MED ORDER — OMEPRAZOLE 40 MG PO CPDR: 40.0000 mg | DELAYED_RELEASE_CAPSULE | Freq: Two times a day (BID) | ORAL | 11 refills | Status: DC

## 2018-09-04 NOTE — Patient Instructions (Signed)
In a few days you may receive a survey in the mail or online from [employer] regarding your visit with us today. Please take a moment to fill this out. Your feedback is very important to our whole office. It can help us better understand your needs as well as improve your experience and satisfaction. Thank you for taking your time to complete it. We care about you.  Sanav Remer, PA-C  
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
## 2018-09-05 DIAGNOSIS — R600 Localized edema: Secondary | ICD-10-CM | POA: Diagnosis not present

## 2018-09-05 DIAGNOSIS — Z4889 Encounter for other specified surgical aftercare: Secondary | ICD-10-CM | POA: Diagnosis not present

## 2018-09-05 DIAGNOSIS — M19012 Primary osteoarthritis, left shoulder: Secondary | ICD-10-CM | POA: Diagnosis not present

## 2018-09-06 DIAGNOSIS — R600 Localized edema: Secondary | ICD-10-CM | POA: Diagnosis not present

## 2018-09-06 DIAGNOSIS — Z4889 Encounter for other specified surgical aftercare: Secondary | ICD-10-CM | POA: Diagnosis not present

## 2018-09-06 DIAGNOSIS — M19012 Primary osteoarthritis, left shoulder: Secondary | ICD-10-CM | POA: Diagnosis not present

## 2018-09-07 ENCOUNTER — Encounter: Payer: Self-pay | Admitting: Neurology

## 2018-09-07 ENCOUNTER — Ambulatory Visit: Payer: BLUE CROSS/BLUE SHIELD | Admitting: Physical Therapy

## 2018-09-07 ENCOUNTER — Ambulatory Visit (INDEPENDENT_AMBULATORY_CARE_PROVIDER_SITE_OTHER): Payer: BLUE CROSS/BLUE SHIELD | Admitting: Neurology

## 2018-09-07 ENCOUNTER — Encounter: Payer: Self-pay | Admitting: Physical Therapy

## 2018-09-07 VITALS — BP 106/82 | HR 65 | Ht 70.0 in | Wt 217.0 lb

## 2018-09-07 DIAGNOSIS — M6281 Muscle weakness (generalized): Secondary | ICD-10-CM | POA: Diagnosis not present

## 2018-09-07 DIAGNOSIS — H8110 Benign paroxysmal vertigo, unspecified ear: Secondary | ICD-10-CM | POA: Diagnosis not present

## 2018-09-07 DIAGNOSIS — G4733 Obstructive sleep apnea (adult) (pediatric): Secondary | ICD-10-CM | POA: Insufficient documentation

## 2018-09-07 DIAGNOSIS — R55 Syncope and collapse: Secondary | ICD-10-CM | POA: Diagnosis not present

## 2018-09-07 DIAGNOSIS — M25512 Pain in left shoulder: Secondary | ICD-10-CM

## 2018-09-07 DIAGNOSIS — Z4889 Encounter for other specified surgical aftercare: Secondary | ICD-10-CM | POA: Diagnosis not present

## 2018-09-07 DIAGNOSIS — M19012 Primary osteoarthritis, left shoulder: Secondary | ICD-10-CM | POA: Diagnosis not present

## 2018-09-07 DIAGNOSIS — M25612 Stiffness of left shoulder, not elsewhere classified: Secondary | ICD-10-CM

## 2018-09-07 DIAGNOSIS — R600 Localized edema: Secondary | ICD-10-CM | POA: Diagnosis not present

## 2018-09-07 NOTE — Therapy (Signed)
[facility] [place], [place], [address] Phone: [phone]   Fax:  [phone]  Physical Therapy Treatment  Patient Details  Name: Marvin Espinoza MRN: [id number] Date of Birth: 1971/02/07 Referring Provider: Justice Britain, MD   Encounter Date: 09/07/2018  PT End of Session - 09/07/18 0806    Visit Number  2    Number of Visits  12    Date for PT Re-Evaluation  10/08/18    Authorization Type  FOTO every 5th visit, progress note every 10th visit    PT Start Time  0730    PT Stop Time  0820    PT Time Calculation (min)  50 min    Activity Tolerance  Patient tolerated treatment well    Behavior During Therapy  [facility] for tasks assessed/performed       Past Medical History:  Diagnosis Date  . Anxiety   . Bipolar disorder ([place])   . CTS (carpal tunnel syndrome)   . Depression   . GERD (gastroesophageal reflux disease)   . H/O bariatric surgery   . Head injury 05/06/2017  . Hypothyroidism   . Hypothyroidism   . Sleep apnea    no need for CPAP at this time  . Sleep apnea     Past Surgical History:  Procedure Laterality Date  . ANAL FISSURE REPAIR    . BARIATRIC SURGERY    . CARPAL TUNNEL RELEASE Left 05/09/2015   Procedure: LEFT CARPAL TUNNEL RELEASE;  Surgeon: Daryll Brod, MD;  Location: [facility];  Service: Orthopedics;  Laterality: Left;  . CARPAL TUNNEL RELEASE Left 05-09-2015  . CARPAL TUNNEL RELEASE Right 06/20/2015   Procedure: RIGHT CARPAL TUNNEL RELEASE;  Surgeon: Daryll Brod, MD;  Location: [facility] [place];  Service: Orthopedics;  Laterality: Right;  REGIONAL/FAB  . CHOLECYSTECTOMY    . COLONOSCOPY    . UPPER GASTROINTESTINAL ENDOSCOPY      There were no vitals filed for this visit.  Subjective Assessment - 09/07/18 0734    Subjective  Patient arrived with some ongoing soreness in shoulder, doing good overall per reported    Limitations  Lifting;House hold activities    Patient Stated  Goals  Use left arm without pain and return to work as a Curator    Currently in Pain?  Yes    Pain Score  5     Pain Location  Shoulder    Pain Orientation  Left    Pain Descriptors / Indicators  Discomfort;Sore    Pain Type  Surgical pain    Pain Onset  1 to 4 weeks ago    Pain Frequency  Intermittent    Aggravating Factors   lifting arm    Pain Relieving Factors  ice and rest         [facility] PT Assessment - 09/07/18 0001      PROM   PROM Assessment Site  Shoulder    Right/Left Shoulder  Left    Left Shoulder External Rotation  60 Degrees                   [facility] Adult PT Treatment/Exercise - 09/07/18 0001      Shoulder Exercises: Supine   Other Supine Exercises  cane for flexion/ER 2x10 each      Shoulder Exercises: Pulleys   Flexion  5 minutes    Other Pulley Exercises  UE ranger for elevation and circles 2x10 each      Electrical Stimulation  Electrical Stimulation Location  Left shoulder    Electrical Stimulation Action  IFC    Electrical Stimulation Parameters  80-150hz  x15 min    Electrical Stimulation Goals  Pain      Vasopneumatic   Number Minutes Vasopneumatic   15 minutes    Vasopnuematic Location   Shoulder    Vasopneumatic Pressure  Low      Manual Therapy   Manual Therapy  Passive ROM    Passive ROM  PROM of R shoulder into flexion, ER, IR with oscillations to promote muscle relaxation, then rhythmis stabs for IR/ER in scaption               PT Short Term Goals - 09/03/18 1028      PT SHORT TERM GOAL #1   Title  STG=LTG        PT Long Term Goals - 09/07/18 5784      PT LONG TERM GOAL #1   Title  Patient will be independent with HEP and its progression.    Time  4    Period  Weeks    Status  On-going      PT LONG TERM GOAL #2   Title  Patient will demonstrate left shoulder flexion AROM to 155+ degrees or greater so patient can reach overhead.    Time  4    Period  Weeks    Status  On-going      PT LONG TERM GOAL #3    Title  Patient will demonstrate left shoulder ER AROM to 70+ degrees to don/doff apparel.     Time  4    Period  Weeks    Status  On-going      PT LONG TERM GOAL #4   Title  Patient will demonstrate left shoulder functional IR to L4 or greater to improve ability to wash back and don/doff belt    Time  4    Period  Weeks    Status  On-going      PT LONG TERM GOAL #5   Title  Patient will demonstrate left shoulder strength to 4+/5 or greater to improve stability during functional tasks and activities.    Time  4    Period  Weeks    Status  On-going            Plan - 09/07/18 0809    Clinical Impression Statement  Patient tolerated treatment well today. Patient progressing with PROM /AAROM with left shoulder with no difficulty and no increased discomfort. Patient has reported doing HEP per PT instructed. Patient has improved ROM today and progressing per protocol. Goals ongoing.     Rehab Potential  Excellent    PT Frequency  3x / week    PT Duration  4 weeks    PT Treatment/Interventions  ADLs/Self Care Home Management;Cryotherapy;Electrical Stimulation;Ultrasound;Moist Heat;Therapeutic activities;Therapeutic exercise;Patient/family education;Neuromuscular re-education;Manual techniques;Passive range of motion;Vasopneumatic Device    PT Next Visit Plan  cont with POC for PROM/AAROM per PT and , Modalities PRN for pain relief.    Consulted and Agree with Plan of Care  Patient       Patient will benefit from skilled therapeutic intervention in order to improve the following deficits and impairments:  Decreased activity tolerance, Pain, Decreased range of motion, Impaired UE functional use, Postural dysfunction  Visit Diagnosis: Acute pain of left shoulder  Stiffness of left shoulder, not elsewhere classified  Muscle weakness (generalized)     Problem List Patient Active Problem List  Diagnosis Date Noted  . Bilateral hip pain 10/24/2017  . Shoulder pain, bilateral  10/24/2017  . Arthritis 10/24/2017  . Osteoarthritis of both hips 10/24/2017  . Avascular necrosis of bone of hip, right ([place]) 10/24/2017  . 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 PTSD (post-traumatic stress disorder) 08/20/2017  . Tardive dyskinesia 08/20/2017  . Severe major depression ([place]) 05/10/2017  . Affective psychosis, bipolar ([place]) 05/10/2017  . Vision disturbance 05/06/2017  . History of concussion 02/10/2017  . Mood disorder ([place]) 02/10/2017  . Adjustment insomnia 01/24/2017  . Adjustment disorder with anxious mood 01/24/2017  . GAD (generalized anxiety disorder) 01/24/2017  . Gastroesophageal reflux disease without esophagitis 11/19/2016  . Hypothyroidism 11/19/2016  . DDD (degenerative disc disease), lumbar 11/19/2016    Raja Liska P, PTA 09/07/2018, 8:27 AM  [facility] [place], [place], [address] Phone: [phone]   Fax:  [phone]  Name: Dany Walther MRN: [id number] Date of Birth: [birth year]

## 2018-09-07 NOTE — Progress Notes (Signed)
BP 139/89   Pulse 76   Temp 98.4 F (36.9 C) (Oral)   Ht 5\' 10"  (1.778 m)   Wt 217 lb 3.2 oz (98.5 kg)   BMI 31.16 kg/m     Subjective:    Patient ID: Marvin Espinoza, male    DOB: 1971-04-16, 47 y.o.   MRN: [id number]  HPI: Marvin Espinoza is a 47 y.o. male presenting on 09/04/2018 for Back Pain (1 month follow up )   Patient comes in for periodic recheck on his chronic medical conditions.  He continues with his low back pain.  We have discussed his side effect to oxycodone.  It will not be prescribed again.  In addition he states he does need a referral for a sleep evaluation so that his CPAP is no longer needed.  He is still seeing neurology and psychiatry for various problems.  He has no new complaints at this time.  Past Medical History:  Diagnosis Date  . Anxiety   . Bipolar disorder ([place])   . CTS (carpal tunnel syndrome)   . Depression   . GERD (gastroesophageal reflux disease)   . H/O bariatric surgery   . Head injury 05/06/2017  . Hypothyroidism   . Hypothyroidism   . Sleep apnea    no need for CPAP at this time  . Sleep apnea    Relevant past medical, surgical, family and social history reviewed and updated as indicated. Interim medical history since our last visit reviewed. Allergies and medications reviewed and updated. DATA REVIEWED: CHART IN EPIC  Family History reviewed for pertinent findings.  Review of Systems  Constitutional: Negative.  Negative for appetite change and fatigue.  Eyes: Negative for pain and visual disturbance.  Respiratory: Negative.  Negative for cough, chest tightness, shortness of breath and wheezing.   Cardiovascular: Negative.  Negative for chest pain, palpitations and leg swelling.  Gastrointestinal: Negative.  Negative for abdominal pain, diarrhea, nausea and vomiting.  Genitourinary: Negative.   Skin: Negative.  Negative for color change and rash.  Neurological: Negative.  Negative for weakness, numbness and headaches.    Psychiatric/Behavioral: Negative.     Allergies as of 09/04/2018      Reactions   Aripiprazole Anaphylaxis, Swelling   "slurred speech"   Cariprazine Hcl Other (See Comments)   Latuda  [lurasidone Hcl] Other (See Comments)   Seroquel [quetiapine Fumarate]    tardive dyskinesia   Alcohol Itching   Drinking alcohol   Cariprazine Other (See Comments)   Tardive dyskenesia   Hydrocodone-acetaminophen Itching   Lurasidone Other (See Comments)   dysconesia   Victoza [liraglutide] Other (See Comments)   Severe heartburn   Victoza [liraglutide]    Severe heartburn   Bupropion Rash   Risperidone Rash      Medication List        Accurate as of 09/04/18 11:59 PM. Always use your most recent med list.          ALPRAZolam 1 MG tablet Commonly known as:  XANAX Take 1 mg by mouth as needed.   CALCIUM 1200 PO Take by mouth 2 (two) times daily.   Carbonyl Iron 15 MG Chew Chew by mouth daily.   levothyroxine 100 MCG tablet Commonly known as:  SYNTHROID, LEVOTHROID TAKE ONE (1) TABLET EACH DAY   lubiprostone 24 MCG capsule Commonly known as:  AMITIZA TAKE 1 CAPSULE TWICE DAILY WITH A MEAL   multivitamin with minerals Tabs tablet Take 1 tablet by mouth 2 (two) times  daily. For Vitamin supplementation   OLANZapine-FLUoxetine 6-25 MG capsule Commonly known as:  SYMBYAX Take by mouth at bedtime.   omeprazole 40 MG capsule Commonly known as:  PRILOSEC Take 1 capsule (40 mg total) by mouth 2 (two) times daily. TAKE ONE (1) CAPSULE EACH DAY   promethazine 25 MG tablet Commonly known as:  PHENERGAN TAKE 1 TABLET EVERY 6 HOURS AS NEEDED FOR NAUSEA AND VOMITING   tizanidine 6 MG capsule Commonly known as:  ZANAFLEX TAKE ONE (1) CAPSULE THREE (3) TIMES EACH DAY   vitamin B-12 250 MCG tablet Commonly known as:  CYANOCOBALAMIN Take 250 mcg by mouth daily.   zolpidem 10 MG tablet Commonly known as:  AMBIEN Take 1 tablet (10 mg total) by mouth at bedtime as needed for  sleep.          Objective:    BP 139/89   Pulse 76   Temp 98.4 F (36.9 C) (Oral)   Ht 5\' 10"  (1.778 m)   Wt 217 lb 3.2 oz (98.5 kg)   BMI 31.16 kg/m    Allergies  Allergen Reactions  . Aripiprazole Anaphylaxis and Swelling    "slurred speech"   . Cariprazine Hcl Other (See Comments)  . Latuda  [Lurasidone Hcl] Other (See Comments)  . Seroquel [Quetiapine Fumarate]     tardive dyskinesia  . Alcohol Itching    Drinking alcohol  . Cariprazine Other (See Comments)    Tardive dyskenesia  . Hydrocodone-Acetaminophen Itching  . Lurasidone Other (See Comments)    dysconesia  . Victoza [Liraglutide] Other (See Comments)    Severe heartburn   . Victoza [Liraglutide]     Severe heartburn  . Bupropion Rash  . Risperidone Rash    Wt Readings from Last 3 Encounters:  09/07/18 217 lb (98.4 kg)  09/04/18 217 lb 3.2 oz (98.5 kg)  08/03/18 224 lb (101.6 kg)    Physical Exam  Constitutional: He appears well-developed and well-nourished. No distress.  HENT:  Head: Normocephalic and atraumatic.  Eyes: Pupils are equal, round, and reactive to light. Conjunctivae and EOM are normal.  Cardiovascular: Normal rate, regular rhythm and normal heart sounds.  Pulmonary/Chest: Effort normal and breath sounds normal. No respiratory distress.  Skin: Skin is warm and dry.  Psychiatric: He has a normal mood and affect. His behavior is normal.  Nursing note and vitals reviewed.   Results for orders placed or performed in visit on 04/29/18  Arthritis Panel  Result Value Ref Range   Uric Acid 4.9 3.7 - 8.6 mg/dL   Rhuematoid fact SerPl-aCnc <10.0 0.0 - 13.9 IU/mL   WBC 9.7 3.4 - 10.8 x10E3/uL   RBC 4.59 4.14 - 5.80 x10E6/uL   Hemoglobin 13.3 13.0 - 17.7 g/dL   Hematocrit 39.6 37.5 - 51.0 %   MCV 86 79 - 97 fL   MCH 29.0 26.6 - 33.0 pg   MCHC 33.6 31.5 - 35.7 g/dL   RDW 14.1 12.3 - 15.4 %   Platelets 276 150 - 379 x10E3/uL   Neutrophils 51 Not Estab. %   Lymphs 41 Not Estab. %    Monocytes 5 Not Estab. %   Eos 2 Not Estab. %   Basos 1 Not Estab. %   Neutrophils Absolute 5.0 1.4 - 7.0 x10E3/uL   Lymphocytes Absolute 4.0 (H) 0.7 - 3.1 x10E3/uL   Monocytes Absolute 0.5 0.1 - 0.9 x10E3/uL   EOS (ABSOLUTE) 0.2 0.0 - 0.4 x10E3/uL   Basophils Absolute 0.1 0.0 - 0.2 x10E3/uL  Immature Granulocytes 0 Not Estab. %   Immature Grans (Abs) 0.0 0.0 - 0.1 x10E3/uL   Sed Rate 2 0 - 15 mm/hr  Lupus anticoagulant panel  Result Value Ref Range   PTT Lupus Anticoagulant 38.4 0.0 - 51.9 sec   Dilute Viper Venom Time 30.7 0.0 - 47.0 sec   Lupus Anticoag Interp Comment:       Assessment & Plan:   1. DDD (degenerative disc disease), lumbar Continue therapy and ortho  2. Gastroesophageal reflux disease without esophagitis - omeprazole (PRILOSEC) 40 MG capsule; Take 1 capsule (40 mg total) by mouth 2 (two) times daily. TAKE ONE (1) CAPSULE EACH DAY  Dispense: 60 capsule; Refill: 11  3. Hypothyroidism, unspecified type - levothyroxine (SYNTHROID, LEVOTHROID) 100 MCG tablet; TAKE ONE (1) TABLET EACH DAY  Dispense: 90 tablet; Refill: 5  4. OSA (obstructive sleep apnea) - Ambulatory referral to Sleep Studies   Continue all other maintenance medications as listed above.  Follow up plan: Return in about 2 months (around 11/04/2018) for recheck.  Educational handout given for [place] PA-C [facility] [address]  [place], [place] [address] [phone]   09/07/2018, 10:26 PM

## 2018-09-07 NOTE — Patient Instructions (Addendum)
1.  We will get an MRI of brain with and without contrast and 1-hour sleep-deprived EEG 2.  Discuss with Dr. Toy Care about stopping Ambien 3.  As per [employer] no driving for 6 months after an episode of unexplained loss of consciousness/awareness 4.  Follow up in 3 to 4 months.  We have sent a referral to [facility] for your MRI and they will call you directly to schedule your appt. They are located at [place]. If you need to contact them directly please call [phone].

## 2018-09-08 ENCOUNTER — Ambulatory Visit: Payer: Self-pay | Admitting: Physical Therapy

## 2018-09-08 DIAGNOSIS — R600 Localized edema: Secondary | ICD-10-CM | POA: Diagnosis not present

## 2018-09-08 DIAGNOSIS — M19012 Primary osteoarthritis, left shoulder: Secondary | ICD-10-CM | POA: Diagnosis not present

## 2018-09-08 DIAGNOSIS — Z4889 Encounter for other specified surgical aftercare: Secondary | ICD-10-CM | POA: Diagnosis not present

## 2018-09-09 ENCOUNTER — Encounter: Payer: Self-pay | Admitting: Physical Therapy

## 2018-09-09 ENCOUNTER — Ambulatory Visit: Payer: BLUE CROSS/BLUE SHIELD | Admitting: Physical Therapy

## 2018-09-09 DIAGNOSIS — M25512 Pain in left shoulder: Secondary | ICD-10-CM | POA: Diagnosis not present

## 2018-09-09 DIAGNOSIS — M25612 Stiffness of left shoulder, not elsewhere classified: Secondary | ICD-10-CM

## 2018-09-09 DIAGNOSIS — Z4889 Encounter for other specified surgical aftercare: Secondary | ICD-10-CM | POA: Diagnosis not present

## 2018-09-09 DIAGNOSIS — M19012 Primary osteoarthritis, left shoulder: Secondary | ICD-10-CM | POA: Diagnosis not present

## 2018-09-09 DIAGNOSIS — M6281 Muscle weakness (generalized): Secondary | ICD-10-CM | POA: Diagnosis not present

## 2018-09-09 DIAGNOSIS — R600 Localized edema: Secondary | ICD-10-CM | POA: Diagnosis not present

## 2018-09-09 NOTE — Therapy (Signed)
[facility] [place], [place], [address] Phone: [phone]   Fax:  [phone]  Physical Therapy Treatment  Patient Details  Name: Marvin Espinoza MRN: [id number] Date of Birth: 10-May-1971 Referring Provider: Justice Britain, MD   Encounter Date: 09/09/2018  PT End of Session - 09/09/18 0821    Visit Number  3    Number of Visits  12    Date for PT Re-Evaluation  10/08/18    Authorization Type  FOTO every 5th visit, progress note every 10th visit    PT Start Time  0730    PT Stop Time  0823    PT Time Calculation (min)  53 min    Activity Tolerance  Patient tolerated treatment well    Behavior During Therapy  [facility] for tasks assessed/performed       Past Medical History:  Diagnosis Date  . Anxiety   . Bipolar disorder ([place])   . CTS (carpal tunnel syndrome)   . Depression   . GERD (gastroesophageal reflux disease)   . H/O bariatric surgery   . Head injury 05/06/2017  . Hypothyroidism   . Hypothyroidism   . Sleep apnea    no need for CPAP at this time  . Sleep apnea     Past Surgical History:  Procedure Laterality Date  . ANAL FISSURE REPAIR    . BARIATRIC SURGERY    . CARPAL TUNNEL RELEASE Left 05/09/2015   Procedure: LEFT CARPAL TUNNEL RELEASE;  Surgeon: Daryll Brod, MD;  Location: [facility];  Service: Orthopedics;  Laterality: Left;  . CARPAL TUNNEL RELEASE Left 05-09-2015  . CARPAL TUNNEL RELEASE Right 06/20/2015   Procedure: RIGHT CARPAL TUNNEL RELEASE;  Surgeon: Daryll Brod, MD;  Location: [place];  Service: Orthopedics;  Laterality: Right;  REGIONAL/FAB  . CHOLECYSTECTOMY    . COLONOSCOPY    . UPPER GASTROINTESTINAL ENDOSCOPY      There were no vitals filed for this visit.  Subjective Assessment - 09/09/18 0736    Subjective  Patient reports shoulder feeling "tight."                       [facility] Adult PT Treatment/Exercise - 09/09/18 0001      Exercises   Exercises  Shoulder      Shoulder Exercises: Supine   Protraction  AAROM;Both;20 reps    External Rotation  AAROM;Left;20 reps    Flexion  AAROM;Both;20 reps      Shoulder Exercises: Pulleys   Flexion  5 minutes    Other Pulley Exercises  UE ranger for elevation and circles 2x10 each      Modalities   Modalities  Electrical Stimulation;Vasopneumatic      Electrical Stimulation   Electrical Stimulation Location  Left shoulder    Electrical Stimulation Action  IFC    Electrical Stimulation Parameters  80-150 hz x15 min    Electrical Stimulation Goals  Pain      Vasopneumatic   Number Minutes Vasopneumatic   15 minutes    Vasopnuematic Location   Shoulder    Vasopneumatic Pressure  Low      Manual Therapy   Manual Therapy  Passive ROM    Passive ROM  PROM of R shoulder into flexion, ER, IR with oscillations to promote muscle relaxation               PT Short Term Goals - 09/03/18 1028      PT SHORT  TERM GOAL #1   Title  STG=LTG        PT Long Term Goals - 09/07/18 6948      PT LONG TERM GOAL #1   Title  Patient will be independent with HEP and its progression.    Time  4    Period  Weeks    Status  On-going      PT LONG TERM GOAL #2   Title  Patient will demonstrate left shoulder flexion AROM to 155+ degrees or greater so patient can reach overhead.    Time  4    Period  Weeks    Status  On-going      PT LONG TERM GOAL #3   Title  Patient will demonstrate left shoulder ER AROM to 70+ degrees to don/doff apparel.     Time  4    Period  Weeks    Status  On-going      PT LONG TERM GOAL #4   Title  Patient will demonstrate left shoulder functional IR to L4 or greater to improve ability to wash back and don/doff belt    Time  4    Period  Weeks    Status  On-going      PT LONG TERM GOAL #5   Title  Patient will demonstrate left shoulder strength to 4+/5 or greater to improve stability during functional tasks and activities.    Time  4    Period   Weeks    Status  On-going            Plan - 09/09/18 0810    Clinical Impression Statement  Patient was able to tolerate treatment well. Patient denied any discomfort with PROM or AAROM. Patient's PROM noted with smooth arc of motion. Normal response to modalities upon removal.     Clinical Presentation  Stable    Clinical Decision Making  Low    Rehab Potential  Excellent    PT Frequency  3x / week    PT Duration  4 weeks    PT Treatment/Interventions  ADLs/Self Care Home Management;Cryotherapy;Electrical Stimulation;Ultrasound;Moist Heat;Therapeutic activities;Therapeutic exercise;Patient/family education;Neuromuscular re-education;Manual techniques;Passive range of motion;Vasopneumatic Device    PT Next Visit Plan  cont with POC for PROM/AAROM per PT and , Modalities PRN for pain relief.    Consulted and Agree with Plan of Care  Patient       Patient will benefit from skilled therapeutic intervention in order to improve the following deficits and impairments:  Decreased activity tolerance, Pain, Decreased range of motion, Impaired UE functional use, Postural dysfunction  Visit Diagnosis: Muscle weakness (generalized)  Acute pain of left shoulder  Stiffness of left shoulder, not elsewhere classified     Problem List Patient Active Problem List   Diagnosis Date Noted  . OSA (obstructive sleep apnea) 09/07/2018  . Bilateral hip pain 10/24/2017  . Shoulder pain, bilateral 10/24/2017  . Arthritis 10/24/2017  . Osteoarthritis of both hips 10/24/2017  . Avascular necrosis of bone of hip, right ([place]) 10/24/2017  . 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 PTSD (post-traumatic stress disorder) 08/20/2017  . Tardive dyskinesia 08/20/2017  . Severe major depression ([place]) 05/10/2017  . Affective psychosis, bipolar ([place]) 05/10/2017  . Vision disturbance 05/06/2017  . History of concussion 02/10/2017  . Mood disorder ([place]) 02/10/2017  . Adjustment insomnia 01/24/2017  .  Adjustment disorder with anxious mood 01/24/2017  . GAD (generalized anxiety disorder) 01/24/2017  . Gastroesophageal reflux disease without esophagitis 11/19/2016  .  Hypothyroidism 11/19/2016  . DDD (degenerative disc disease), lumbar 11/19/2016    Gabriela Eves, PT, DPT 09/09/2018, 8:27 AM  [facility] [address] [place], [place], [address] Phone: [phone]   Fax:  [phone]  Name: Marvin Espinoza MRN: [id number] Date of Birth: 1971/04/23

## 2018-09-10 DIAGNOSIS — M19012 Primary osteoarthritis, left shoulder: Secondary | ICD-10-CM | POA: Diagnosis not present

## 2018-09-10 DIAGNOSIS — R600 Localized edema: Secondary | ICD-10-CM | POA: Diagnosis not present

## 2018-09-10 DIAGNOSIS — Z4889 Encounter for other specified surgical aftercare: Secondary | ICD-10-CM | POA: Diagnosis not present

## 2018-09-11 ENCOUNTER — Ambulatory Visit (INDEPENDENT_AMBULATORY_CARE_PROVIDER_SITE_OTHER): Payer: BLUE CROSS/BLUE SHIELD

## 2018-09-11 ENCOUNTER — Encounter: Payer: Self-pay | Admitting: Physical Therapy

## 2018-09-11 ENCOUNTER — Ambulatory Visit: Payer: BLUE CROSS/BLUE SHIELD | Admitting: Physical Therapy

## 2018-09-11 DIAGNOSIS — Z4889 Encounter for other specified surgical aftercare: Secondary | ICD-10-CM | POA: Diagnosis not present

## 2018-09-11 DIAGNOSIS — M6281 Muscle weakness (generalized): Secondary | ICD-10-CM

## 2018-09-11 DIAGNOSIS — M25512 Pain in left shoulder: Secondary | ICD-10-CM

## 2018-09-11 DIAGNOSIS — M25612 Stiffness of left shoulder, not elsewhere classified: Secondary | ICD-10-CM

## 2018-09-11 DIAGNOSIS — Z23 Encounter for immunization: Secondary | ICD-10-CM | POA: Diagnosis not present

## 2018-09-11 DIAGNOSIS — M19012 Primary osteoarthritis, left shoulder: Secondary | ICD-10-CM | POA: Diagnosis not present

## 2018-09-11 DIAGNOSIS — R600 Localized edema: Secondary | ICD-10-CM | POA: Diagnosis not present

## 2018-09-11 NOTE — Therapy (Signed)
[facility] [place], [place], [address] Phone: [phone]   Fax:  [phone]  Physical Therapy Treatment  Patient Details  Name: Marvin Espinoza MRN: [id number] Date of Birth: May 04, 1971 Referring Provider (PT): Justice Britain, MD   Encounter Date: 09/11/2018  PT End of Session - 09/11/18 0732    Visit Number  4    Number of Visits  12    Date for PT Re-Evaluation  10/08/18    Authorization Type  FOTO every 5th visit, progress note every 10th visit    PT Start Time  0730    PT Stop Time  0819    PT Time Calculation (min)  49 min    Activity Tolerance  Patient tolerated treatment well    Behavior During Therapy  [facility] for tasks assessed/performed       Past Medical History:  Diagnosis Date  . Anxiety   . Bipolar disorder ([place])   . CTS (carpal tunnel syndrome)   . Depression   . GERD (gastroesophageal reflux disease)   . H/O bariatric surgery   . Head injury 05/06/2017  . Hypothyroidism   . Hypothyroidism   . Sleep apnea    no need for CPAP at this time  . Sleep apnea     Past Surgical History:  Procedure Laterality Date  . ANAL FISSURE REPAIR    . BARIATRIC SURGERY    . CARPAL TUNNEL RELEASE Left 05/09/2015   Procedure: LEFT CARPAL TUNNEL RELEASE;  Surgeon: Daryll Brod, MD;  Location: [place];  Service: Orthopedics;  Laterality: Left;  . CARPAL TUNNEL RELEASE Left 05-09-2015  . CARPAL TUNNEL RELEASE Right 06/20/2015   Procedure: RIGHT CARPAL TUNNEL RELEASE;  Surgeon: Daryll Brod, MD;  Location: [place];  Service: Orthopedics;  Laterality: Right;  REGIONAL/FAB  . CHOLECYSTECTOMY    . COLONOSCOPY    . UPPER GASTROINTESTINAL ENDOSCOPY      There were no vitals filed for this visit.  Subjective Assessment - 09/11/18 0732    Subjective  Patient reports having a bad day yesterday stating it was "tight". Pain is 6 or 7/10    Pertinent History  Head injury.    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  Yes    Pain Score  7     Pain Location  Shoulder    Pain Orientation  Left    Pain Descriptors / Indicators  Discomfort;Tightness    Pain Type  Surgical pain    Pain Onset  1 to 4 weeks ago    Pain Frequency  Constant          [facility] PT Assessment - 09/11/18 0001      Assessment   Medical Diagnosis  impringement syndrome of left shoulder; s/p SA. SAD, DCR    Next MD Visit  October 2019    Prior Therapy  yes for right shoulder      Precautions   Precautions  Shoulder                   [facility] Adult PT Treatment/Exercise - 09/11/18 0001      Exercises   Exercises  Shoulder      Shoulder Exercises: Supine   Protraction  AAROM;Both;20 reps    External Rotation  AAROM;Left;20 reps    Flexion  AAROM;Both;20 reps  Shoulder Exercises: Pulleys   Flexion  5 minutes    Other Pulley Exercises  UE ranger for elevation and circles 2x10 each      Modalities   Modalities  Electrical Stimulation;Vasopneumatic      Electrical Stimulation   Electrical Stimulation Location  Left shoulder    Electrical Stimulation Action  IFC    Electrical Stimulation Parameters  80-150 hz x15 min    Electrical Stimulation Goals  Pain      Vasopneumatic   Number Minutes Vasopneumatic   15 minutes    Vasopnuematic Location   Shoulder    Vasopneumatic Pressure  Low      Manual Therapy   Manual Therapy  Passive ROM    Passive ROM  PROM of R shoulder into flexion, ER, IR with oscillations to promote muscle relaxation               PT Short Term Goals - 09/03/18 1028      PT SHORT TERM GOAL #1   Title  STG=LTG        PT Long Term Goals - 09/07/18 4098      PT LONG TERM GOAL #1   Title  Patient will be independent with HEP and its progression.    Time  4    Period  Weeks    Status  On-going      PT LONG TERM GOAL #2   Title   Patient will demonstrate left shoulder flexion AROM to 155+ degrees or greater so patient can reach overhead.    Time  4    Period  Weeks    Status  On-going      PT LONG TERM GOAL #3   Title  Patient will demonstrate left shoulder ER AROM to 70+ degrees to don/doff apparel.     Time  4    Period  Weeks    Status  On-going      PT LONG TERM GOAL #4   Title  Patient will demonstrate left shoulder functional IR to L4 or greater to improve ability to wash back and don/doff belt    Time  4    Period  Weeks    Status  On-going      PT LONG TERM GOAL #5   Title  Patient will demonstrate left shoulder strength to 4+/5 or greater to improve stability during functional tasks and activities.    Time  4    Period  Weeks    Status  On-going            Plan - 09/11/18 0807    Clinical Impression Statement  Patient was able to tolerate treatment despite reports of increased pain and tightness. Patient denied any pain or discomfort during PROM. Patient educated to continue AAROM HEP only. Patient reported understanding. Normal response to modalities at end of treatment.    Clinical Presentation  Stable    Clinical Decision Making  Low    Rehab Potential  Excellent    PT Frequency  3x / week    PT Duration  4 weeks    PT Treatment/Interventions  ADLs/Self Care Home Management;Cryotherapy;Electrical Stimulation;Ultrasound;Moist Heat;Therapeutic activities;Therapeutic exercise;Patient/family education;Neuromuscular re-education;Manual techniques;Passive range of motion;Vasopneumatic Device    PT Next Visit Plan  cont with POC for PROM/AAROM per PT and , Modalities PRN for pain relief.    Consulted and Agree with Plan of Care  Patient       Patient will benefit from skilled therapeutic intervention in  order to improve the following deficits and impairments:  Decreased activity tolerance, Pain, Decreased range of motion, Impaired UE functional use, Postural dysfunction  Visit  Diagnosis: Acute pain of left shoulder  Muscle weakness (generalized)  Stiffness of left shoulder, not elsewhere classified     Problem List Patient Active Problem List   Diagnosis Date Noted  . OSA (obstructive sleep apnea) 09/07/2018  . Bilateral hip pain 10/24/2017  . Shoulder pain, bilateral 10/24/2017  . Arthritis 10/24/2017  . Osteoarthritis of both hips 10/24/2017  . Avascular necrosis of bone of hip, right ([place]) 10/24/2017  . 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 PTSD (post-traumatic stress disorder) 08/20/2017  . Tardive dyskinesia 08/20/2017  . Severe major depression ([place]) 05/10/2017  . Affective psychosis, bipolar ([place]) 05/10/2017  . Vision disturbance 05/06/2017  . History of concussion 02/10/2017  . Mood disorder ([place]) 02/10/2017  . Adjustment insomnia 01/24/2017  . Adjustment disorder with anxious mood 01/24/2017  . GAD (generalized anxiety disorder) 01/24/2017  . Gastroesophageal reflux disease without esophagitis 11/19/2016  . Hypothyroidism 11/19/2016  . DDD (degenerative disc disease), lumbar 11/19/2016   Gabriela Eves, PT, DPT 09/11/2018, 9:11 AM  [facility] [address] [place], [place], [address] Phone: [phone]   Fax:  [phone]  Name: Anais Koenen MRN: [id number] Date of Birth: 1971-10-19

## 2018-09-12 DIAGNOSIS — Z4889 Encounter for other specified surgical aftercare: Secondary | ICD-10-CM | POA: Diagnosis not present

## 2018-09-12 DIAGNOSIS — M19012 Primary osteoarthritis, left shoulder: Secondary | ICD-10-CM | POA: Diagnosis not present

## 2018-09-12 DIAGNOSIS — R600 Localized edema: Secondary | ICD-10-CM | POA: Diagnosis not present

## 2018-09-13 DIAGNOSIS — Z4889 Encounter for other specified surgical aftercare: Secondary | ICD-10-CM | POA: Diagnosis not present

## 2018-09-13 DIAGNOSIS — R600 Localized edema: Secondary | ICD-10-CM | POA: Diagnosis not present

## 2018-09-13 DIAGNOSIS — M19012 Primary osteoarthritis, left shoulder: Secondary | ICD-10-CM | POA: Diagnosis not present

## 2018-09-14 ENCOUNTER — Encounter: Payer: Self-pay | Admitting: Physical Therapy

## 2018-09-14 DIAGNOSIS — Z4889 Encounter for other specified surgical aftercare: Secondary | ICD-10-CM | POA: Diagnosis not present

## 2018-09-14 DIAGNOSIS — M19012 Primary osteoarthritis, left shoulder: Secondary | ICD-10-CM | POA: Diagnosis not present

## 2018-09-14 DIAGNOSIS — R600 Localized edema: Secondary | ICD-10-CM | POA: Diagnosis not present

## 2018-09-15 DIAGNOSIS — M19012 Primary osteoarthritis, left shoulder: Secondary | ICD-10-CM | POA: Diagnosis not present

## 2018-09-15 DIAGNOSIS — R600 Localized edema: Secondary | ICD-10-CM | POA: Diagnosis not present

## 2018-09-15 DIAGNOSIS — Z4889 Encounter for other specified surgical aftercare: Secondary | ICD-10-CM | POA: Diagnosis not present

## 2018-09-16 ENCOUNTER — Ambulatory Visit (INDEPENDENT_AMBULATORY_CARE_PROVIDER_SITE_OTHER): Payer: BLUE CROSS/BLUE SHIELD | Admitting: Neurology

## 2018-09-16 DIAGNOSIS — R55 Syncope and collapse: Secondary | ICD-10-CM

## 2018-09-16 DIAGNOSIS — Z4889 Encounter for other specified surgical aftercare: Secondary | ICD-10-CM | POA: Diagnosis not present

## 2018-09-16 DIAGNOSIS — R600 Localized edema: Secondary | ICD-10-CM | POA: Diagnosis not present

## 2018-09-16 DIAGNOSIS — M19012 Primary osteoarthritis, left shoulder: Secondary | ICD-10-CM | POA: Diagnosis not present

## 2018-09-17 ENCOUNTER — Telehealth: Payer: Self-pay

## 2018-09-17 DIAGNOSIS — R600 Localized edema: Secondary | ICD-10-CM | POA: Diagnosis not present

## 2018-09-17 DIAGNOSIS — Z4889 Encounter for other specified surgical aftercare: Secondary | ICD-10-CM | POA: Diagnosis not present

## 2018-09-17 DIAGNOSIS — M19012 Primary osteoarthritis, left shoulder: Secondary | ICD-10-CM | POA: Diagnosis not present

## 2018-09-17 NOTE — Procedures (Signed)
ELECTROENCEPHALOGRAM REPORT  Date of Study: 09/16/2018  Patient's Name: Marvin Espinoza MRN: [id number] Date of Birth: 08-04-1971  Clinical History: 47 year old male with black out spell.  Medications: Tizanidine Ambien Amitiza Symbyax  Technical Summary: A multichannel digital EEG recording measured by the international 10-20 system with electrodes applied with paste and impedances below 5000 ohms performed in our laboratory with EKG monitoring in an awake and asleep patient.  Hyperventilation and photic stimulation were performed.  The digital EEG was referentially recorded, reformatted, and digitally filtered in a variety of bipolar and referential montages for optimal display.    Description: The patient is awake and asleep during the recording.  During maximal wakefulness, there is a symmetric, medium voltage 8-9 Hz posterior dominant rhythm that attenuates with eye opening.  The record is symmetric.  During drowsiness and sleep, there is an increase in theta slowing of the background.  Vertex waves and symmetric sleep spindles were seen.  Hyperventilation and photic stimulation did not elicit any abnormalities.  There were no epileptiform discharges or electrographic seizures seen.    EKG lead was unremarkable.  Impression: This awake and asleep EEG is normal.    Clinical Correlation: A normal EEG does not exclude a clinical diagnosis of epilepsy.  If further clinical questions remain, prolonged EEG may be helpful.  Clinical correlation is advised.   Metta Clines, DO

## 2018-09-17 NOTE — Telephone Encounter (Signed)
Called and advised Pt of EEG results 

## 2018-09-17 NOTE — Telephone Encounter (Signed)
-----   Message from Pieter Partridge, DO sent at 09/17/2018  3:34 PM EDT ----- EEG is normal

## 2018-09-18 ENCOUNTER — Ambulatory Visit: Payer: BLUE CROSS/BLUE SHIELD | Attending: Orthopedic Surgery | Admitting: Physical Therapy

## 2018-09-18 DIAGNOSIS — M25512 Pain in left shoulder: Secondary | ICD-10-CM | POA: Diagnosis not present

## 2018-09-18 DIAGNOSIS — Z4889 Encounter for other specified surgical aftercare: Secondary | ICD-10-CM | POA: Diagnosis not present

## 2018-09-18 DIAGNOSIS — M25511 Pain in right shoulder: Secondary | ICD-10-CM | POA: Diagnosis not present

## 2018-09-18 DIAGNOSIS — R600 Localized edema: Secondary | ICD-10-CM | POA: Diagnosis not present

## 2018-09-18 DIAGNOSIS — M6281 Muscle weakness (generalized): Secondary | ICD-10-CM | POA: Insufficient documentation

## 2018-09-18 DIAGNOSIS — M25612 Stiffness of left shoulder, not elsewhere classified: Secondary | ICD-10-CM | POA: Insufficient documentation

## 2018-09-18 DIAGNOSIS — M19012 Primary osteoarthritis, left shoulder: Secondary | ICD-10-CM | POA: Diagnosis not present

## 2018-09-18 NOTE — Therapy (Signed)
[facility] [place], [place], [address] Phone: [phone]   Fax:  [phone]  Physical Therapy Treatment/MD progress note  Patient Details  Name: Marvin Espinoza MRN: [id number] Date of Birth: [birth year] Referring Provider (PT): Justice Britain, MD   Encounter Date: 09/18/2018  PT End of Session - 09/18/18 0908    Visit Number  5    Number of Visits  12    Date for PT Re-Evaluation  10/08/18    Authorization Type  FOTO every 5th visit, progress note every 10th visit    PT Start Time  0815    PT Stop Time  0900    PT Time Calculation (min)  45 min    Activity Tolerance  Patient tolerated treatment well    Behavior During Therapy  [facility] for tasks assessed/performed       Past Medical History:  Diagnosis Date  . Anxiety   . Bipolar disorder ([place])   . CTS (carpal tunnel syndrome)   . Depression   . GERD (gastroesophageal reflux disease)   . H/O bariatric surgery   . Head injury 05/06/2017  . Hypothyroidism   . Hypothyroidism   . Sleep apnea    no need for CPAP at this time  . Sleep apnea     Past Surgical History:  Procedure Laterality Date  . ANAL FISSURE REPAIR    . BARIATRIC SURGERY    . CARPAL TUNNEL RELEASE Left 05/09/2015   Procedure: LEFT CARPAL TUNNEL RELEASE;  Surgeon: Daryll Brod, MD;  Location: [place];  Service: Orthopedics;  Laterality: Left;  . CARPAL TUNNEL RELEASE Left 05-09-2015  . CARPAL TUNNEL RELEASE Right 06/20/2015   Procedure: RIGHT CARPAL TUNNEL RELEASE;  Surgeon: Daryll Brod, MD;  Location: [place];  Service: Orthopedics;  Laterality: Right;  REGIONAL/FAB  . CHOLECYSTECTOMY    . COLONOSCOPY    . UPPER GASTROINTESTINAL ENDOSCOPY      There were no vitals filed for this visit.  Subjective Assessment - 09/18/18 0817    Subjective  Pt relays his shoulder is coming along, he still has touble with both arms.    Pertinent History  Head injury.    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  Yes    Pain Score  6     Pain Location  Shoulder    Pain Orientation  Left    Pain Onset  1 to 4 weeks ago    Pain Frequency  Intermittent         [facility] PT Assessment - 09/18/18 0001      Assessment   Medical Diagnosis  impringement syndrome of left shoulder; s/p SA. SAD, DCR    Next MD Visit  09/18/18      ROM / Strength   AROM / PROM / Strength  Strength;AROM;PROM      AROM   Right/Left Shoulder  Left    Left Shoulder Flexion  150 Degrees    Left Shoulder ABduction  140 Degrees      PROM   Left Shoulder Flexion  --   [facility]   Left Shoulder Internal Rotation  55 Degrees    Left Shoulder External Rotation  --   [facility]     Strength   Overall Strength Comments  --    Strength Assessment Site  Shoulder  Right/Left Shoulder  Left    Left Shoulder Flexion  5/5    Left Shoulder ABduction  4+/5    Left Shoulder Internal Rotation  5/5    Left Shoulder External Rotation  5/5                   [facility] Adult PT Treatment/Exercise - 09/18/18 0001      Exercises   Exercises  Shoulder      Shoulder Exercises: Supine   Protraction  AAROM;Both;20 reps    External Rotation  AAROM;Left;20 reps    Flexion  AAROM;Both;20 reps    Other Supine Exercises  supine chest stretch 10 sec X 10 and self deep tissue release with lacrosse ball to pec minor 2 min       Shoulder Exercises: Pulleys   Flexion  5 minutes    Other Pulley Exercises  UE ranger for elevation,abd and circles 2x10 each      Shoulder Exercises: ROM/Strengthening   Other ROM/Strengthening Exercises  doorway chest stretch 30 sec X 3 low to mid    Other ROM/Strengthening Exercises  standing IR stretch behind back with belt 10 sec X 10      Modalities   Modalities  --   pt declined     Manual Therapy   Passive ROM  PROM shoulder all planes, GH mobs inf glide, distraction  glide, ant-post glide 3 bouts 30 sec ea             PT Education - 09/18/18 0907    Education Details  IR and PEC stretching to add into HEP    Person(s) Educated  Patient    Methods  Explanation;Demonstration;Verbal cues;Handout    Comprehension  Verbalized understanding;Returned demonstration       PT Short Term Goals - 09/03/18 1028      PT SHORT TERM GOAL #1   Title  STG=LTG        PT Long Term Goals - 09/18/18 0914      PT LONG TERM GOAL #1   Title  Patient will be independent with HEP and its progression.    Time  4    Period  Weeks    Status  On-going      PT LONG TERM GOAL #2   Title  Patient will demonstrate left shoulder flexion AROM to 155+ degrees or greater so patient can reach overhead.    Time  4    Period  Weeks    Status  Partially Met      PT LONG TERM GOAL #3   Title  Patient will demonstrate left shoulder ER AROM to 70+ degrees to don/doff apparel.     Time  4    Period  Weeks    Status  Achieved      PT LONG TERM GOAL #4   Title  Patient will demonstrate left shoulder functional IR to L4 or greater to improve ability to wash back and don/doff belt    Time  4    Period  Weeks    Status  Achieved      PT LONG TERM GOAL #5   Title  Patient will demonstrate left shoulder strength to 4+/5 or greater to improve stability during functional tasks and activities.    Time  4    Period  Weeks    Status  Achieved            Plan - 09/18/18 0908    Clinical Impression  Statement  Pt is making good progress with ROM thus far but does still have limitations with IR and abd with pec tightness. He was shown self deep tissue release, pec stretching, and IR stretching to add into HEP. He has pretty good strength considering PT has not worked on strengthening much during the healing phase. He will be 4 weeks post op next week and he will probably be ready to incorporate some strengthening. He will continue to benefit from PT.   Rehab Potential   Excellent    PT Frequency  3x / week    PT Duration  4 weeks    PT Treatment/Interventions  ADLs/Self Care Home Management;Cryotherapy;Electrical Stimulation;Ultrasound;Moist Heat;Therapeutic activities;Therapeutic exercise;Patient/family education;Neuromuscular re-education;Manual techniques;Passive range of motion;Vasopneumatic Device    PT Next Visit Plan  cont with POC for PROM/AAROM per PT and he will likely be ready to progress to strengthening next week, Modalities PRN for pain relief.    PT Home Exercise Plan  added IR and pec stretching    Consulted and Agree with Plan of Care  Patient       Patient will benefit from skilled therapeutic intervention in order to improve the following deficits and impairments:  Decreased activity tolerance, Pain, Decreased range of motion, Impaired UE functional use, Postural dysfunction  Visit Diagnosis: Acute pain of left shoulder  Muscle weakness (generalized)  Stiffness of left shoulder, not elsewhere classified  Acute pain of right shoulder     Problem List Patient Active Problem List   Diagnosis Date Noted  . OSA (obstructive sleep apnea) 09/07/2018  . Bilateral hip pain 10/24/2017  . Shoulder pain, bilateral 10/24/2017  . Arthritis 10/24/2017  . Osteoarthritis of both hips 10/24/2017  . Avascular necrosis of bone of hip, right ([place]) 10/24/2017  . 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 PTSD (post-traumatic stress disorder) 08/20/2017  . Tardive dyskinesia 08/20/2017  . Severe major depression ([place]) 05/10/2017  . Affective psychosis, bipolar ([place]) 05/10/2017  . Vision disturbance 05/06/2017  . History of concussion 02/10/2017  . Mood disorder ([place]) 02/10/2017  . Adjustment insomnia 01/24/2017  . Adjustment disorder with anxious mood 01/24/2017  . GAD (generalized anxiety disorder) 01/24/2017  . Gastroesophageal reflux disease without esophagitis 11/19/2016  . Hypothyroidism 11/19/2016  . DDD (degenerative disc disease),  lumbar 11/19/2016    Debbe Odea, PT, DPT 09/18/2018, 9:30 AM  [facility] [address] [place], [place], [address] Phone: [phone]   Fax:  [phone]  Name: Rumi Taras MRN: [id number] Date of Birth: September 25, 1971

## 2018-09-19 DIAGNOSIS — M19012 Primary osteoarthritis, left shoulder: Secondary | ICD-10-CM | POA: Diagnosis not present

## 2018-09-19 DIAGNOSIS — Z4889 Encounter for other specified surgical aftercare: Secondary | ICD-10-CM | POA: Diagnosis not present

## 2018-09-19 DIAGNOSIS — R600 Localized edema: Secondary | ICD-10-CM | POA: Diagnosis not present

## 2018-09-20 ENCOUNTER — Ambulatory Visit
Admission: RE | Admit: 2018-09-20 | Discharge: 2018-09-20 | Disposition: A | Discharge status: Home / Self Care | Payer: BLUE CROSS/BLUE SHIELD | Source: Ambulatory Visit | Attending: Neurology | Admitting: Neurology

## 2018-09-20 ENCOUNTER — Other Ambulatory Visit: Payer: Self-pay

## 2018-09-20 DIAGNOSIS — R600 Localized edema: Secondary | ICD-10-CM | POA: Diagnosis not present

## 2018-09-20 DIAGNOSIS — H8110 Benign paroxysmal vertigo, unspecified ear: Secondary | ICD-10-CM

## 2018-09-20 DIAGNOSIS — M19012 Primary osteoarthritis, left shoulder: Secondary | ICD-10-CM | POA: Diagnosis not present

## 2018-09-20 DIAGNOSIS — R55 Syncope and collapse: Secondary | ICD-10-CM

## 2018-09-20 DIAGNOSIS — R42 Dizziness and giddiness: Secondary | ICD-10-CM | POA: Diagnosis not present

## 2018-09-20 DIAGNOSIS — Z4889 Encounter for other specified surgical aftercare: Secondary | ICD-10-CM | POA: Diagnosis not present

## 2018-09-20 MED ORDER — GADOBENATE DIMEGLUMINE 529 MG/ML IV SOLN
20.0000 mL | Freq: Once | INTRAVENOUS | Status: AC | PRN
  Administered 2018-09-20: 20 mL via INTRAVENOUS

## 2018-09-21 ENCOUNTER — Ambulatory Visit: Payer: BLUE CROSS/BLUE SHIELD | Admitting: Physical Therapy

## 2018-09-21 ENCOUNTER — Encounter: Payer: Self-pay | Admitting: Physical Therapy

## 2018-09-21 DIAGNOSIS — M6281 Muscle weakness (generalized): Secondary | ICD-10-CM

## 2018-09-21 DIAGNOSIS — M25512 Pain in left shoulder: Secondary | ICD-10-CM

## 2018-09-21 DIAGNOSIS — M25511 Pain in right shoulder: Secondary | ICD-10-CM | POA: Diagnosis not present

## 2018-09-21 DIAGNOSIS — M25612 Stiffness of left shoulder, not elsewhere classified: Secondary | ICD-10-CM

## 2018-09-21 NOTE — Therapy (Signed)
[facility] [place], [place], [address] Phone: [phone]   Fax:  [phone]  Physical Therapy Treatment  Patient Details  Name: Marvin Espinoza MRN: [id number] Date of Birth: 1971/11/23 Referring Provider (PT): Justice Britain, MD   Encounter Date: 09/21/2018  PT End of Session - 09/21/18 0808    Visit Number  6    Number of Visits  12    Date for PT Re-Evaluation  10/08/18    Authorization Type  FOTO every 5th visit, progress note every 10th visit    PT Start Time  0730   arrived but not checked in   PT Stop Time  0819    PT Time Calculation (min)  49 min    Activity Tolerance  Patient tolerated treatment well    Behavior During Therapy  [facility] for tasks assessed/performed       Past Medical History:  Diagnosis Date  . Anxiety   . Bipolar disorder ([place])   . CTS (carpal tunnel syndrome)   . Depression   . GERD (gastroesophageal reflux disease)   . H/O bariatric surgery   . Head injury 05/06/2017  . Hypothyroidism   . Hypothyroidism   . Sleep apnea    no need for CPAP at this time  . Sleep apnea     Past Surgical History:  Procedure Laterality Date  . ANAL FISSURE REPAIR    . BARIATRIC SURGERY    . CARPAL TUNNEL RELEASE Left 05/09/2015   Procedure: LEFT CARPAL TUNNEL RELEASE;  Surgeon: Daryll Brod, MD;  Location: [place];  Service: Orthopedics;  Laterality: Left;  . CARPAL TUNNEL RELEASE Left 05-09-2015  . CARPAL TUNNEL RELEASE Right 06/20/2015   Procedure: RIGHT CARPAL TUNNEL RELEASE;  Surgeon: Daryll Brod, MD;  Location: [place];  Service: Orthopedics;  Laterality: Right;  REGIONAL/FAB  . CHOLECYSTECTOMY    . COLONOSCOPY    . UPPER GASTROINTESTINAL ENDOSCOPY      There were no vitals filed for this visit.  Subjective Assessment - 09/21/18 0807    Subjective  Patient reports shoulder is feeling better 4.5-5/10 in left shoulder today.    Pertinent History  Head injury.    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  Yes    Pain Score  5     Pain Location  Shoulder    Pain Orientation  Left    Pain Descriptors / Indicators  Discomfort;Tingling    Pain Onset  1 to 4 weeks ago    Pain Frequency  Intermittent         [facility] PT Assessment - 09/21/18 0001      Assessment   Medical Diagnosis  impringement syndrome of left shoulder; s/p SA. SAD, DCR    Next MD Visit  09/29/18                   [facility] Adult PT Treatment/Exercise - 09/21/18 0001      Exercises   Exercises  Shoulder      Shoulder Exercises: Pulleys   Flexion  5 minutes      Shoulder Exercises: ROM/Strengthening   UBE (Upper Arm Bike)  120 RPM x6' (x3' fwd, x3' bwd)      Shoulder Exercises: Isometric Strengthening   Flexion  Other (comment)   5" hold x10   Extension  Other (  comment)   5" hold x10   External Rotation  Other (comment)   5" hold x10   Internal Rotation  Other (comment)   5" hold x10     Modalities   Modalities  Electrical Stimulation;Vasopneumatic      Electrical Stimulation   Electrical Stimulation Location  Left shoulder    Electrical Stimulation Action  IFC    Electrical Stimulation Parameters  80-150 hz x15 min    Electrical Stimulation Goals  Pain      Vasopneumatic   Number Minutes Vasopneumatic   15 minutes    Vasopnuematic Location   Shoulder    Vasopneumatic Pressure  Low    Vasopneumatic Temperature   34      Manual Therapy   Manual Therapy  Passive ROM    Passive ROM  PROM shoulder all planes with gentle holds at end range               PT Short Term Goals - 09/03/18 1028      PT SHORT TERM GOAL #1   Title  STG=LTG        PT Long Term Goals - 09/18/18 0914      PT LONG TERM GOAL #1   Title  Patient will be independent with HEP and its progression.    Time  4    Period  Weeks    Status  On-going      PT LONG  TERM GOAL #2   Title  Patient will demonstrate left shoulder flexion AROM to 155+ degrees or greater so patient can reach overhead.    Time  4    Period  Weeks    Status  Partially Met      PT LONG TERM GOAL #3   Title  Patient will demonstrate left shoulder ER AROM to 70+ degrees to don/doff apparel.     Time  4    Period  Weeks    Status  Achieved      PT LONG TERM GOAL #4   Title  Patient will demonstrate left shoulder functional IR to L4 or greater to improve ability to wash back and don/doff belt    Time  4    Period  Weeks    Status  Achieved      PT LONG TERM GOAL #5   Title  Patient will demonstrate left shoulder strength to 4+/5 or greater to improve stability during functional tasks and activities.    Time  4    Period  Weeks    Status  Achieved            Plan - 09/21/18 0355    Clinical Impression Statement  Patient was able to tolerate progression of treatment well with no complaints with new strengthening exercises. Patient reported being complaint with new exercises to which he reports improvements in AROM. No increase of pain throughout session; normal response to modalities upon removal.    Clinical Presentation  Stable    Clinical Decision Making  Low    Rehab Potential  Excellent    PT Frequency  3x / week    PT Duration  4 weeks    PT Next Visit Plan  PROM for ER, progress to strengthening, Modalities PRN for pain relief.    Consulted and Agree with Plan of Care  Patient       Patient will benefit from skilled therapeutic intervention in order to improve the following deficits and impairments:  Decreased activity  tolerance, Pain, Decreased range of motion, Impaired UE functional use, Postural dysfunction  Visit Diagnosis: Acute pain of left shoulder  Muscle weakness (generalized)  Stiffness of left shoulder, not elsewhere classified     Problem List Patient Active Problem List   Diagnosis Date Noted  . OSA (obstructive sleep apnea)  09/07/2018  . Bilateral hip pain 10/24/2017  . Shoulder pain, bilateral 10/24/2017  . Arthritis 10/24/2017  . Osteoarthritis of both hips 10/24/2017  . Avascular necrosis of bone of hip, right ([place]) 10/24/2017  . 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 PTSD (post-traumatic stress disorder) 08/20/2017  . Tardive dyskinesia 08/20/2017  . Severe major depression ([place]) 05/10/2017  . Affective psychosis, bipolar ([place]) 05/10/2017  . Vision disturbance 05/06/2017  . History of concussion 02/10/2017  . Mood disorder ([place]) 02/10/2017  . Adjustment insomnia 01/24/2017  . Adjustment disorder with anxious mood 01/24/2017  . GAD (generalized anxiety disorder) 01/24/2017  . Gastroesophageal reflux disease without esophagitis 11/19/2016  . Hypothyroidism 11/19/2016  . DDD (degenerative disc disease), lumbar 11/19/2016   Gabriela Eves, PT, DPT 09/21/2018, 9:00 AM  [facility] [place], [place], [address] Phone: [phone]   Fax:  [phone]  Name: Marvin Espinoza MRN: [id number] Date of Birth: March 28, 1971

## 2018-09-22 ENCOUNTER — Encounter: Payer: Self-pay | Admitting: *Deleted

## 2018-09-22 ENCOUNTER — Telehealth: Payer: Self-pay | Admitting: *Deleted

## 2018-09-22 NOTE — Telephone Encounter (Signed)
-----   Message from Chester Holstein, LPN sent at 37/02/4286 11:47 AM EDT -----   ----- Message ----- From: Pieter Partridge, DO Sent: 09/21/2018   8:59 AM EDT To: Clois Comber, CMA  MRI of brain looks unremarkable

## 2018-09-22 NOTE — Telephone Encounter (Signed)
Results sent via My Chart.  

## 2018-09-24 ENCOUNTER — Encounter: Payer: Self-pay | Admitting: Physical Therapy

## 2018-09-24 ENCOUNTER — Ambulatory Visit: Payer: BLUE CROSS/BLUE SHIELD | Admitting: Physical Therapy

## 2018-09-24 DIAGNOSIS — M25512 Pain in left shoulder: Secondary | ICD-10-CM

## 2018-09-24 DIAGNOSIS — F3131 Bipolar disorder, current episode depressed, mild: Secondary | ICD-10-CM | POA: Diagnosis not present

## 2018-09-24 DIAGNOSIS — M25612 Stiffness of left shoulder, not elsewhere classified: Secondary | ICD-10-CM

## 2018-09-24 DIAGNOSIS — M6281 Muscle weakness (generalized): Secondary | ICD-10-CM

## 2018-09-24 DIAGNOSIS — F3111 Bipolar disorder, current episode manic without psychotic features, mild: Secondary | ICD-10-CM | POA: Diagnosis not present

## 2018-09-24 DIAGNOSIS — M25511 Pain in right shoulder: Secondary | ICD-10-CM | POA: Diagnosis not present

## 2018-09-24 NOTE — Therapy (Addendum)
[facility] [place], [place], [address] Phone: [phone]   Fax:  [phone]  Physical Therapy Treatment  PHYSICAL THERAPY DISCHARGE SUMMARY  Visits from Start of Care: 7  Current functional level related to goals / functional outcomes: See below   Remaining deficits: See goals   Education / Equipment: HEP  Plan: Patient agrees to discharge.  Patient goals were partially met. Patient is being discharged due to not returning since the last visit.  ?????    Gabriela Eves, PT, DPT 02/10/19   Patient Details  Name: Marvin Espinoza MRN: [id number] Date of Birth: 11-Aug-1971 Referring Provider (PT): Justice Britain, MD   Encounter Date: 09/24/2018  PT End of Session - 09/24/18 0849    Visit Number  7    Number of Visits  12    Date for PT Re-Evaluation  10/08/18    Authorization Type  FOTO every 5th visit, progress note every 10th visit    PT Start Time  0814    PT Stop Time  0904    PT Time Calculation (min)  50 min    Activity Tolerance  Patient tolerated treatment well    Behavior During Therapy  [facility] for tasks assessed/performed       Past Medical History:  Diagnosis Date  . Anxiety   . Bipolar disorder ([place])   . CTS (carpal tunnel syndrome)   . Depression   . GERD (gastroesophageal reflux disease)   . H/O bariatric surgery   . Head injury 05/06/2017  . Hypothyroidism   . Hypothyroidism   . Sleep apnea    no need for CPAP at this time  . Sleep apnea     Past Surgical History:  Procedure Laterality Date  . ANAL FISSURE REPAIR    . BARIATRIC SURGERY    . CARPAL TUNNEL RELEASE Left 05/09/2015   Procedure: LEFT CARPAL TUNNEL RELEASE;  Surgeon: Daryll Brod, MD;  Location: [place];  Service: Orthopedics;  Laterality: Left;  . CARPAL TUNNEL RELEASE Left 05-09-2015  . CARPAL TUNNEL RELEASE Right 06/20/2015   Procedure: RIGHT CARPAL TUNNEL RELEASE;  Surgeon: Daryll Brod, MD;  Location:  [facility];  Service: Orthopedics;  Laterality: Right;  REGIONAL/FAB  . CHOLECYSTECTOMY    . COLONOSCOPY    . UPPER GASTROINTESTINAL ENDOSCOPY      There were no vitals filed for this visit.  Subjective Assessment - 09/24/18 0820    Subjective  Patient arrived and reported doing well with left shoulder today, right shoulder hurts worse     Pertinent History  Head injury.    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  Yes    Pain Score  5     Pain Location  Shoulder    Pain Orientation  Left    Pain Descriptors / Indicators  Discomfort    Pain Type  Surgical pain    Pain Onset  1 to 4 weeks ago    Pain Frequency  Intermittent    Aggravating Factors   lifting shoulder    Pain Relieving Factors  ice and rest         [facility] PT Assessment - 09/24/18 0001      AROM   Right/Left Shoulder  Left    Left Shoulder ABduction  150 Degrees    Left Shoulder External Rotation  70 Degrees      PROM   Right/Left Shoulder  Left    Left Shoulder Flexion  170 Degrees    Left Shoulder Internal Rotation  --    Left Shoulder External Rotation  75 Degrees                   [facility] Adult PT Treatment/Exercise - 09/24/18 0001      Shoulder Exercises: Supine   Protraction  AAROM;Both;20 reps    External Rotation  AAROM;Left;20 reps    Flexion  AAROM;Both;20 reps      Shoulder Exercises: Standing   Other Standing Exercises  wall slide with eccentric lowering x10      Shoulder Exercises: Pulleys   Flexion  5 minutes      Electrical Stimulation   Electrical Stimulation Location  Left shoulder    Electrical Stimulation Action  IFC    Electrical Stimulation Parameters  80-'150hz'  x43mm    Electrical Stimulation Goals  Pain      Vasopneumatic   Number Minutes Vasopneumatic   15 minutes    Vasopnuematic Location   Shoulder    Vasopneumatic Pressure  Low       Manual Therapy   Manual Therapy  Passive ROM    Passive ROM  PROM shoulder all planes with gentle holds at end range, then rhythmic stabs for all directions               PT Short Term Goals - 09/03/18 1028      PT SHORT TERM GOAL #1   Title  STG=LTG        PT Long Term Goals - 09/24/18 0830      PT LONG TERM GOAL #1   Title  Patient will be independent with HEP and its progression.    Time  4    Period  Weeks    Status  On-going      PT LONG TERM GOAL #2   Title  Patient will demonstrate left shoulder flexion AROM to 155+ degrees or greater so patient can reach overhead.    Time  4    Period  Weeks    Status  On-going   AROM 150 degrees 09/24/18     PT LONG TERM GOAL #3   Title  Patient will demonstrate left shoulder ER AROM to 70+ degrees to don/doff apparel.     Time  4    Period  Weeks    Status  Achieved      PT LONG TERM GOAL #4   Title  Patient will demonstrate left shoulder functional IR to L4 or greater to improve ability to wash back and don/doff belt    Time  4    Period  Weeks    Status  Achieved      PT LONG TERM GOAL #5   Title  Patient will demonstrate left shoulder strength to 4+/5 or greater to improve stability during functional tasks and activities.    Time  4    Period  Weeks    Status  Achieved            Plan - 09/24/18 0851    Clinical Impression Statement  Patient tolerated treatment well today. Patient has improved PROM WNL today and able to perform all exercises wit ease. Patient continues to report some discomfort and esp with lifting his right shoulder. Patient has not met AROM for flexion at this time. MD F/U next week.  Rehab Potential  Excellent    PT Frequency  3x / week    PT Duration  4 weeks    PT Treatment/Interventions  ADLs/Self Care Home Management;Cryotherapy;Electrical Stimulation;Ultrasound;Moist Heat;Therapeutic activities;Therapeutic exercise;Patient/family education;Neuromuscular re-education;Manual  techniques;Passive range of motion;Vasopneumatic Device    PT Next Visit Plan  cont with POC per MD note sent    Consulted and Agree with Plan of Care  Patient       Patient will benefit from skilled therapeutic intervention in order to improve the following deficits and impairments:  Decreased activity tolerance, Pain, Decreased range of motion, Impaired UE functional use, Postural dysfunction  Visit Diagnosis: Acute pain of left shoulder  Muscle weakness (generalized)  Stiffness of left shoulder, not elsewhere classified     Problem List Patient Active Problem List   Diagnosis Date Noted  . OSA (obstructive sleep apnea) 09/07/2018  . Bilateral hip pain 10/24/2017  . Shoulder pain, bilateral 10/24/2017  . Arthritis 10/24/2017  . Osteoarthritis of both hips 10/24/2017  . Avascular necrosis of bone of hip, right ([place]) 10/24/2017  . 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 PTSD (post-traumatic stress disorder) 08/20/2017  . Tardive dyskinesia 08/20/2017  . Severe major depression ([place]) 05/10/2017  . Affective psychosis, bipolar ([place]) 05/10/2017  . Vision disturbance 05/06/2017  . History of concussion 02/10/2017  . Mood disorder ([place]) 02/10/2017  . Adjustment insomnia 01/24/2017  . Adjustment disorder with anxious mood 01/24/2017  . GAD (generalized anxiety disorder) 01/24/2017  . Gastroesophageal reflux disease without esophagitis 11/19/2016  . Hypothyroidism 11/19/2016  . DDD (degenerative disc disease), lumbar 11/19/2016    Ladean Raya, PTA 09/24/18 9:15 AM  [facility] [place], [place], [address] Phone: [phone]   Fax:  [phone]  Name: Ajeet Casasola MRN: [id number] Date of Birth: 04-12-1971

## 2018-09-28 DIAGNOSIS — Z79899 Other long term (current) drug therapy: Secondary | ICD-10-CM | POA: Diagnosis not present

## 2018-09-29 ENCOUNTER — Encounter: Payer: Self-pay | Admitting: Physical Therapy

## 2018-10-01 ENCOUNTER — Encounter: Payer: Self-pay | Admitting: Physical Therapy

## 2018-10-03 ENCOUNTER — Ambulatory Visit (HOSPITAL_COMMUNITY): Admit: 2018-10-03 | Payer: BLUE CROSS/BLUE SHIELD

## 2018-10-03 ENCOUNTER — Encounter (HOSPITAL_COMMUNITY): Payer: Self-pay

## 2018-10-03 ENCOUNTER — Other Ambulatory Visit: Payer: Self-pay

## 2018-10-03 ENCOUNTER — Emergency Department (HOSPITAL_COMMUNITY)
Admission: EM | Admit: 2018-10-03 | Discharge: 2018-10-04 | Disposition: A | Discharge status: Other Acute Inpatient Hospital | Payer: BLUE CROSS/BLUE SHIELD | Source: Home / Self Care | Attending: Emergency Medicine | Admitting: Emergency Medicine

## 2018-10-03 ENCOUNTER — Ambulatory Visit (HOSPITAL_COMMUNITY)
Admission: RE | Admit: 2018-10-03 | Discharge: 2018-10-03 | Disposition: A | Discharge status: Home / Self Care | Payer: BLUE CROSS/BLUE SHIELD | Source: Home / Self Care | Attending: Psychiatry | Admitting: Psychiatry

## 2018-10-03 DIAGNOSIS — Z79899 Other long term (current) drug therapy: Secondary | ICD-10-CM

## 2018-10-03 DIAGNOSIS — F319 Bipolar disorder, unspecified: Secondary | ICD-10-CM

## 2018-10-03 DIAGNOSIS — Z833 Family history of diabetes mellitus: Secondary | ICD-10-CM | POA: Diagnosis not present

## 2018-10-03 DIAGNOSIS — T887XXA Unspecified adverse effect of drug or medicament, initial encounter: Secondary | ICD-10-CM | POA: Diagnosis not present

## 2018-10-03 DIAGNOSIS — Z639 Problem related to primary support group, unspecified: Secondary | ICD-10-CM | POA: Diagnosis not present

## 2018-10-03 DIAGNOSIS — Z841 Family history of disorders of kidney and ureter: Secondary | ICD-10-CM | POA: Diagnosis not present

## 2018-10-03 DIAGNOSIS — F329 Major depressive disorder, single episode, unspecified: Secondary | ICD-10-CM | POA: Insufficient documentation

## 2018-10-03 DIAGNOSIS — Z915 Personal history of self-harm: Secondary | ICD-10-CM | POA: Diagnosis not present

## 2018-10-03 DIAGNOSIS — E039 Hypothyroidism, unspecified: Secondary | ICD-10-CM | POA: Insufficient documentation

## 2018-10-03 DIAGNOSIS — Z800 Family history of malignant neoplasm of digestive organs: Secondary | ICD-10-CM | POA: Diagnosis not present

## 2018-10-03 DIAGNOSIS — F29 Unspecified psychosis not due to a substance or known physiological condition: Secondary | ICD-10-CM | POA: Diagnosis not present

## 2018-10-03 DIAGNOSIS — I445 Left posterior fascicular block: Secondary | ICD-10-CM | POA: Diagnosis not present

## 2018-10-03 DIAGNOSIS — R45850 Homicidal ideations: Secondary | ICD-10-CM | POA: Insufficient documentation

## 2018-10-03 DIAGNOSIS — F332 Major depressive disorder, recurrent severe without psychotic features: Secondary | ICD-10-CM | POA: Diagnosis not present

## 2018-10-03 DIAGNOSIS — Z818 Family history of other mental and behavioral disorders: Secondary | ICD-10-CM | POA: Diagnosis not present

## 2018-10-03 DIAGNOSIS — R45851 Suicidal ideations: Secondary | ICD-10-CM | POA: Diagnosis not present

## 2018-10-03 DIAGNOSIS — K219 Gastro-esophageal reflux disease without esophagitis: Secondary | ICD-10-CM | POA: Diagnosis not present

## 2018-10-03 DIAGNOSIS — Z8249 Family history of ischemic heart disease and other diseases of the circulatory system: Secondary | ICD-10-CM | POA: Diagnosis not present

## 2018-10-03 LAB — CBC
HCT: 39.4 % (ref 39.0–52.0)
Hemoglobin: 12.6 g/dL — ABNORMAL LOW (ref 13.0–17.0)
MCH: 28.8 pg (ref 26.0–34.0)
MCHC: 32 g/dL (ref 30.0–36.0)
MCV: 90 fL (ref 80.0–100.0)
Platelets: 234 10*3/uL (ref 150–400)
RBC: 4.38 MIL/uL (ref 4.22–5.81)
RDW: 13 % (ref 11.5–15.5)
WBC: 7.1 10*3/uL (ref 4.0–10.5)
nRBC: 0 % (ref 0.0–0.2)

## 2018-10-03 LAB — RAPID URINE DRUG SCREEN, HOSP PERFORMED
Amphetamines: NOT DETECTED
Barbiturates: NOT DETECTED
Benzodiazepines: POSITIVE — AB
Cocaine: NOT DETECTED
Opiates: NOT DETECTED
Tetrahydrocannabinol: NOT DETECTED

## 2018-10-03 LAB — COMPREHENSIVE METABOLIC PANEL
ALT: 30 U/L (ref 0–44)
AST: 23 U/L (ref 15–41)
Albumin: 3.6 g/dL (ref 3.5–5.0)
Alkaline Phosphatase: 77 U/L (ref 38–126)
Anion gap: 7 (ref 5–15)
BUN: 16 mg/dL (ref 6–20)
CO2: 25 mmol/L (ref 22–32)
Calcium: 8.4 mg/dL — ABNORMAL LOW (ref 8.9–10.3)
Chloride: 107 mmol/L (ref 98–111)
Creatinine, Ser: 0.79 mg/dL (ref 0.61–1.24)
GFR calc Af Amer: >60 mL/min (ref >60)
GFR calc non Af Amer: >60 mL/min (ref >60)
Glucose, Bld: 132 mg/dL — ABNORMAL HIGH (ref 70–99)
Potassium: 3.4 mmol/L — ABNORMAL LOW (ref 3.5–5.1)
Sodium: 139 mmol/L (ref 135–145)
Total Bilirubin: 0.3 mg/dL (ref 0.3–1.2)
Total Protein: 6.4 g/dL — ABNORMAL LOW (ref 6.5–8.1)

## 2018-10-03 LAB — ACETAMINOPHEN LEVEL: Acetaminophen (Tylenol), Serum: <10 ug/mL — ABNORMAL LOW (ref 10–30)

## 2018-10-03 LAB — SALICYLATE LEVEL: Salicylate Lvl: <7 mg/dL (ref 2.8–30.0)

## 2018-10-03 LAB — CBG MONITORING, ED: Glucose-Capillary: 128 mg/dL — ABNORMAL HIGH (ref 70–99)

## 2018-10-03 LAB — ETHANOL: Alcohol, Ethyl (B): <10 mg/dL (ref <10)

## 2018-10-03 NOTE — BH Assessment (Addendum)
Assessment Note  Marvin Espinoza is an 47 y.o. male who presents to the ED voluntarily. Pt initially presented to [facility] as a walk-in due to an intentional OD of 15 xanax since 10am this morning, however for medical reasons pt was transported to [facility]. [name] met with the pt who remained drowsy throughout the assessment. Pt states he began to feel depressed after a conflict with his mother-in-law. It is unclear what transpired causing the pt and his mother-in-law to have a disagreement, however he stated "she changed her will." Pt also stated "the way she is." Pt is falling asleep throughout the assessment and has to be stimulated multiple times in order to arouse. Pt states he has attempted suicide in the past as an adolescent. Pt states he is followed by Dr. Toy Care, MD for [facility] [place] needs.    Pt meets criteria for inpt treatment per Patriciaann Clan, PA. Pt accepted to [facility] to 305-2 to Dr. Parke Poisson, MD. Call to report [phone]. Bed will be ready at 03:00 per [facility]. EDP Tegeler, Gwenyth Allegra, MD and charge nurse advised.  Diagnosis: Bipolar Disorder, current episode depressed  Past Medical History:  Past Medical History:  Diagnosis Date  . Anxiety   . Bipolar disorder ([place])   . CTS (carpal tunnel syndrome)   . Depression   . GERD (gastroesophageal reflux disease)   . H/O bariatric surgery   . Head injury 05/06/2017  . Hypothyroidism   . Hypothyroidism   . Sleep apnea    no need for CPAP at this time  . Sleep apnea     Past Surgical History:  Procedure Laterality Date  . ANAL FISSURE REPAIR    . BARIATRIC SURGERY    . CARPAL TUNNEL RELEASE Left 05/09/2015   Procedure: LEFT CARPAL TUNNEL RELEASE;  Surgeon: Daryll Brod, MD;  Location: [place];  Service: Orthopedics;  Laterality: Left;  . CARPAL TUNNEL RELEASE Left 05-09-2015  . CARPAL TUNNEL RELEASE Right 06/20/2015   Procedure: RIGHT CARPAL TUNNEL RELEASE;  Surgeon: Daryll Brod, MD;  Location: [place];  Service: Orthopedics;   Laterality: Right;  REGIONAL/FAB  . CHOLECYSTECTOMY    . COLONOSCOPY    . UPPER GASTROINTESTINAL ENDOSCOPY      Family History:  Family History  Problem Relation Age of Onset  . Diabetes Mother   . Heart disease Mother   . Depression Mother   . Esophageal cancer Mother   . COPD Father   . Heart disease Father   . Kidney disease Father   . Mental illness Father   . Depression Father   . Mental illness Sister   . Depression Sister   . Esophageal cancer Maternal Uncle   . Colon cancer Neg Hx   . Rectal cancer Neg Hx   . Stomach cancer Neg Hx     Social History:  reports that he has never smoked. He has never used smokeless tobacco. He reports that he does not drink alcohol or use drugs.  Additional Social History:  Alcohol / Drug Use Pain Medications: See MAR Prescriptions: See MAR Over the Counter: See MAR History of alcohol / drug use?: No history of alcohol / drug abuse  CIWA: CIWA-Ar BP: 103/71 Pulse Rate: 72 COWS:    Allergies:  Allergies  Allergen Reactions  . Aripiprazole Anaphylaxis and Swelling    "slurred speech"   . Cariprazine Hcl Other (See Comments)  . Latuda  [Lurasidone Hcl] Other (See Comments)  . Seroquel [Quetiapine Fumarate]  tardive dyskinesia  . Alcohol Itching    Drinking alcohol  . Cariprazine Other (See Comments)    Tardive dyskenesia  . Hydrocodone-Acetaminophen Itching  . Lurasidone Other (See Comments)    dysconesia  . Victoza [Liraglutide] Other (See Comments)    Severe heartburn   . Victoza [Liraglutide]     Severe heartburn  . Bupropion Rash  . Risperidone Rash    Home Medications:  (Not in a hospital admission)  OB/GYN Status:  No LMP for male patient.  General Assessment Data Location of Assessment: [facility] ED [name] Assessment: In system Is this a Tele or Face-to-Face Assessment?: Face-to-Face Is this an Initial Assessment or a Re-assessment for this encounter?: Initial Assessment Patient Accompanied by::  (alone) Language Other than English: No What gender do you identify as?: Male Marital status: Married Pregnancy Status: No Living Arrangements: Spouse/significant other Can pt return to current living arrangement?: Yes Admission Status: Voluntary Is patient capable of signing voluntary admission?: Yes Referral Source: Self/Family/Friend Insurance type: [place] Living Arrangements: Spouse/significant other Name of Psychiatrist: Dr. Toy Care, MD Name of Therapist: none  Education Status Is patient currently in school?: No Is the patient employed, unemployed or receiving disability?: Employed  Risk to self with the past 6 months Suicidal Ideation: Yes-Currently Present Has patient been a risk to self within the past 6 months prior to admission? : Yes Suicidal Intent: Yes-Currently Present Has patient had any suicidal intent within the past 6 months prior to admission? : Yes Is patient at risk for suicide?: Yes Suicidal Plan?: Yes-Currently Present Has patient had any suicidal plan within the past 6 months prior to admission? : Yes Specify Current Suicidal Plan: patient started taking Xanax around 10 AM and took approximately 3 pills every couple of hours for a total of 15 pills Access to Means: Yes Specify Access to Suicidal Means: pt has access to medication What has been your use of drugs/alcohol within the last 12 months?: denies use Previous Attempts/Gestures: Yes How many times?: 1 Other Self Harm Risks: hx of suicide attempts, depression, ongoing MH issues, intent and plan for SI by OD  Triggers for Past Attempts: Other personal contacts Intentional Self Injurious Behavior: None Family Suicide History: No Recent stressful life event(s): Conflict (Comment)(w/ wife and mother-in-law) Persecutory voices/beliefs?: No Depression: Yes Depression Symptoms: Despondent, Isolating, Fatigue, Loss of interest in usual pleasures, Feeling worthless/self pity, Feeling  angry/irritable Substance abuse history and/or treatment for substance abuse?: No Suicide prevention information given to non-admitted patients: Not applicable  Risk to Others within the past 6 months Homicidal Ideation: Yes-Currently Present Does patient have any lifetime risk of violence toward others beyond the six months prior to admission? : Yes (comment)(pt has passive HI to wife and mother in Sports coach) Thoughts of Harm to Others: Yes-Currently Present Comment - Thoughts of Harm to Others: pt states he thinks of hurting his mother-in-law Current Homicidal Intent: No Current Homicidal Plan: No Access to Homicidal Means: No Identified Victim: mother in law, wife History of harm to others?: No Assessment of Violence: None Noted Does patient have access to weapons?: No(pt denies) Criminal Charges Pending?: No Does patient have a court date: No Is patient on probation?: No  Psychosis Hallucinations: None noted Delusions: None noted  Mental Status Report Appearance/Hygiene: In hospital gown Eye Contact: Poor Motor Activity: Unsteady Speech: Slow Level of Consciousness: Drowsy Mood: Depressed Affect: Depressed Anxiety Level: None Thought Processes: Relevant, Coherent Judgement: Impaired Orientation: Person, Place, Time, Situation, Appropriate  for developmental age Obsessive Compulsive Thoughts/Behaviors: None  Cognitive Functioning Concentration: Normal Memory: Remote Intact, Recent Intact Is patient IDD: No Insight: Poor Impulse Control: Poor Appetite: Good Have you had any weight changes? : No Change Sleep: Decreased Total Hours of Sleep: 5 Vegetative Symptoms: None  ADLScreening [facility] Assessment Services) Patient's cognitive ability adequate to safely complete daily activities?: Yes Patient able to express need for assistance with ADLs?: Yes Independently performs ADLs?: Yes (appropriate for developmental age)  Prior Inpatient Therapy Prior Inpatient Therapy:  No  Prior Outpatient Therapy Prior Outpatient Therapy: Yes Prior Therapy Dates: current Prior Therapy Facilty/Provider(s): Dr. Toy Care, MD Reason for Treatment: MED MANAGEMENT Does patient have an ACCT team?: No Does patient have Intensive In-House Services?  : No Does patient have [facility] services? : No Does patient have P4CC services?: No  ADL Screening (condition at time of admission) Patient's cognitive ability adequate to safely complete daily activities?: Yes Is the patient deaf or have difficulty hearing?: No Does the patient have difficulty seeing, even when wearing glasses/contacts?: No Does the patient have difficulty concentrating, remembering, or making decisions?: No Patient able to express need for assistance with ADLs?: Yes Does the patient have difficulty dressing or bathing?: No Independently performs ADLs?: Yes (appropriate for developmental age) Does the patient have difficulty walking or climbing stairs?: No Weakness of Legs: None Weakness of Arms/Hands: None  Home Assistive Devices/Equipment Home Assistive Devices/Equipment: None    Abuse/Neglect Assessment (Assessment to be complete while patient is alone) Abuse/Neglect Assessment Can Be Completed: Yes Physical Abuse: Denies Verbal Abuse: Denies Sexual Abuse: Denies Exploitation of patient/patient's resources: Denies Self-Neglect: Denies     Regulatory affairs officer (For Healthcare) Does Patient Have a Medical Advance Directive?: No Would patient like information on creating a medical advance directive?: No - Patient declined          Disposition: Pt meets criteria for inpt treatment per Patriciaann Clan, PA. Pt accepted to [facility] to 305-2 to Dr. Parke Poisson, MD. Call to report [phone]. Bed will be ready at 03:00 per [facility]. ED staff advised.  Disposition Initial Assessment Completed for this Encounter: Yes Disposition of Patient: Admit Type of inpatient treatment program: Adult(per Patriciaann Clan, PA) Patient refused  recommended treatment: No  On Site Evaluation by:   Reviewed with Physician:    Lyanne Co 10/04/2018 12:05 AM

## 2018-10-03 NOTE — ED Provider Notes (Signed)
Marvin Espinoza Provider Note   CSN: [id number] Arrival date & time: 10/03/18  2141     History   Chief Complaint Chief Complaint  Patient presents with  . Drug Overdose    HPI Marvin Espinoza is a 47 y.o. male.  The history is provided by the patient and medical records. No language interpreter was used.  Mental Health Problem  Presenting symptoms: agitation, depression, homicidal ideas, suicidal thoughts and suicidal threats   Presenting symptoms: no aggressive behavior and no suicide attempt   Degree of incapacity (severity):  Severe Onset quality:  Sudden Duration:  1 day Timing:  Constant Progression:  Worsening Chronicity:  New Context: stressful life event   Context: not alcohol use, not drug abuse, not noncompliant and not recent medication change   Treatment compliance:  All of the time Worsened by:  Nothing Associated symptoms: no abdominal pain, no chest pain, no fatigue and no headaches     Past Medical History:  Diagnosis Date  . Anxiety   . Bipolar disorder ([place])   . CTS (carpal tunnel syndrome)   . Depression   . GERD (gastroesophageal reflux disease)   . H/O bariatric surgery   . Head injury 05/06/2017  . Hypothyroidism   . Hypothyroidism   . Sleep apnea    no need for CPAP at this time  . Sleep apnea     Patient Active Problem List   Diagnosis Date Noted  . OSA (obstructive sleep apnea) 09/07/2018  . Bilateral hip pain 10/24/2017  . Shoulder pain, bilateral 10/24/2017  . Arthritis 10/24/2017  . Osteoarthritis of both hips 10/24/2017  . Avascular necrosis of bone of hip, right ([place]) 10/24/2017  . 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 PTSD (post-traumatic stress disorder) 08/20/2017  . Tardive dyskinesia 08/20/2017  . Severe major depression ([place]) 05/10/2017  . Affective psychosis, bipolar ([place]) 05/10/2017  . Vision disturbance 05/06/2017  . History of concussion 02/10/2017  . Mood disorder ([place])  02/10/2017  . Adjustment insomnia 01/24/2017  . Adjustment disorder with anxious mood 01/24/2017  . GAD (generalized anxiety disorder) 01/24/2017  . Gastroesophageal reflux disease without esophagitis 11/19/2016  . Hypothyroidism 11/19/2016  . DDD (degenerative disc disease), lumbar 11/19/2016    Past Surgical History:  Procedure Laterality Date  . ANAL FISSURE REPAIR    . BARIATRIC SURGERY    . CARPAL TUNNEL RELEASE Left 05/09/2015   Procedure: LEFT CARPAL TUNNEL RELEASE;  Surgeon: Daryll Brod, MD;  Location: [facility];  Service: Orthopedics;  Laterality: Left;  . CARPAL TUNNEL RELEASE Left 05-09-2015  . CARPAL TUNNEL RELEASE Right 06/20/2015   Procedure: RIGHT CARPAL TUNNEL RELEASE;  Surgeon: Daryll Brod, MD;  Location: [facility];  Service: Orthopedics;  Laterality: Right;  REGIONAL/FAB  . CHOLECYSTECTOMY    . COLONOSCOPY    . UPPER GASTROINTESTINAL ENDOSCOPY          Home Medications    Prior to Admission medications   Medication Sig Start Date End Date Taking? Authorizing Provider  ALPRAZolam Duanne Moron) 1 MG tablet Take 1 mg by mouth as needed. 06/03/17   [provider]  Calcium Carbonate-Vit D-Min (CALCIUM 1200 PO) Take by mouth 2 (two) times daily.     [provider]  Carbonyl Iron 15 MG CHEW Chew by mouth daily.     [provider]  levothyroxine (SYNTHROID, LEVOTHROID) 100 MCG tablet TAKE ONE (1) TABLET EACH DAY 09/04/18   Terald Sleeper, PA-C  lubiprostone (AMITIZA) 24  MCG capsule TAKE 1 CAPSULE TWICE DAILY WITH A MEAL 09/04/18   Terald Sleeper, PA-C  Multiple Vitamin (MULTIVITAMIN WITH MINERALS) TABS tablet Take 1 tablet by mouth 2 (two) times daily. For Vitamin supplementation 05/15/17   Lindell Spar I, NP  OLANZapine-FLUoxetine (SYMBYAX) 6-25 MG capsule Take by mouth at bedtime.    [provider]  omeprazole (PRILOSEC) 40 MG capsule Take 1 capsule (40 mg total) by mouth 2 (two) times daily. TAKE ONE (1) CAPSULE  EACH DAY 09/04/18   Terald Sleeper, PA-C  promethazine (PHENERGAN) 25 MG tablet TAKE 1 TABLET EVERY 6 HOURS AS NEEDED FOR NAUSEA AND VOMITING 06/15/18   Terald Sleeper, PA-C  tizanidine (ZANAFLEX) 6 MG capsule TAKE ONE (1) CAPSULE THREE (3) TIMES EACH DAY 08/31/18   Terald Sleeper, PA-C  vitamin B-12 (CYANOCOBALAMIN) 250 MCG tablet Take 250 mcg by mouth daily.    [provider]  zolpidem (AMBIEN) 10 MG tablet Take 1 tablet (10 mg total) by mouth at bedtime as needed for sleep. 09/04/18 10/04/18  Terald Sleeper, PA-C    Family History Family History  Problem Relation Age of Onset  . Diabetes Mother   . Heart disease Mother   . Depression Mother   . Esophageal cancer Mother   . COPD Father   . Heart disease Father   . Kidney disease Father   . Mental illness Father   . Depression Father   . Mental illness Sister   . Depression Sister   . Esophageal cancer Maternal Uncle   . Colon cancer Neg Hx   . Rectal cancer Neg Hx   . Stomach cancer Neg Hx     Social History Social History   Tobacco Use  . Smoking status: Never Smoker  . Smokeless tobacco: Never Used  Substance Use Topics  . Alcohol use: No  . Drug use: No     Allergies   Aripiprazole; Cariprazine hcl; Latuda  [lurasidone hcl]; Seroquel [quetiapine fumarate]; Alcohol; Cariprazine; Hydrocodone-acetaminophen; Lurasidone; Victoza [liraglutide]; Victoza [liraglutide]; Bupropion; and Risperidone   Review of Systems Review of Systems  Constitutional: Negative for activity change, chills, diaphoresis, fatigue and fever.  HENT: Negative for congestion and rhinorrhea.   Eyes: Negative for visual disturbance.  Respiratory: Negative for cough, chest tightness, shortness of breath, wheezing and stridor.   Cardiovascular: Negative for chest pain, palpitations and leg swelling.  Gastrointestinal: Negative for abdominal distention, abdominal pain, blood in stool, constipation, diarrhea, nausea and vomiting.  Genitourinary:  Negative for difficulty urinating, dysuria and flank pain.  Musculoskeletal: Negative for back pain, gait problem, neck pain and neck stiffness.  Skin: Negative for rash and wound.  Neurological: Negative for dizziness, weakness, light-headedness, numbness and headaches.  Psychiatric/Behavioral: Positive for agitation, homicidal ideas and suicidal ideas. Negative for confusion.  All other systems reviewed and are negative.    Physical Exam Updated Vital Signs There were no vitals taken for this visit.  Physical Exam  Constitutional: He is oriented to person, place, and time. He appears well-developed and well-nourished. No distress.  HENT:  Head: Normocephalic and atraumatic.  Mouth/Throat: No oropharyngeal exudate.  Eyes: Pupils are equal, round, and reactive to light. Conjunctivae and EOM are normal.  Neck: Normal range of motion.  Cardiovascular: Normal rate and intact distal pulses.  No murmur heard. Pulmonary/Chest: Effort normal and breath sounds normal. No respiratory distress. He has no wheezes. He has no rales. He exhibits no tenderness.  Abdominal: Soft. There is no tenderness.  Musculoskeletal:  He exhibits no edema or tenderness.  Neurological: He is alert and oriented to person, place, and time. No cranial nerve deficit or sensory deficit. He exhibits normal muscle tone. Coordination normal.  Skin: He is not diaphoretic. No erythema. No pallor.  Psychiatric: His mood appears anxious. His speech is not rapid and/or pressured. He is not agitated and not actively hallucinating. Thought content is not paranoid. He exhibits a depressed mood. He expresses homicidal and suicidal ideation. He expresses homicidal plans.  Nursing note and vitals reviewed.    ED Treatments / Results  Labs (all labs ordered are listed, but only abnormal results are displayed) Labs Reviewed  CBC - Abnormal; Notable for the following components:      Result Value   Hemoglobin 12.6 (*)    All  other components within normal limits  RAPID URINE DRUG SCREEN, HOSP PERFORMED - Abnormal; Notable for the following components:   Benzodiazepines POSITIVE (*)    All other components within normal limits  CBG MONITORING, ED - Abnormal; Notable for the following components:   Glucose-Capillary 128 (*)    All other components within normal limits  COMPREHENSIVE METABOLIC PANEL  ETHANOL  SALICYLATE LEVEL  ACETAMINOPHEN LEVEL    EKG EKG Interpretation  Date/Time:  Saturday October 03 2018 21:59:29 EDT Ventricular Rate:  77 PR Interval:    QRS Duration: 107 QT Interval:  393 QTC Calculation: 445 R Axis:   92 Text Interpretation:  Sinus rhythm Left posterior fascicular block Low voltage with right axis deviation When compared to prior, no signficant changes seen.  No STEMI Confirmed by Antony Blackbird [phone]) on 10/03/2018 10:13:01 PM   Radiology No results found.  Procedures Procedures (including critical care time)  Medications Ordered in ED Medications - No data to display   Initial Impression / Assessment and Plan / ED Course  I have reviewed the triage vital signs and the nursing notes.  Pertinent labs & imaging results that were available during my care of the patient were reviewed by me and considered in my medical decision making (see chart for details).     Adom Schoeneck is a 47 y.o. male with a past medical history significant for bipolar disorder, depression, anxiety, and thyroid disease who presents from behavioral health for suicidal ideation homicidal ideation and accident overdose.  Patient reports that he has a history of suicide attempt back when he was 80 with overdose.  He says that his mother just got out of the hospital several days ago and his sister has been spending time with her.  He reports that he is concerned about his sister's drug use being around his mother.  He reports this upset him and they had a verbal altercation today.  He reports that due to  his increased stress and anxiety with this he began taking extra Xanax.  He reports throughout the day he took a total of approximately 15 mg of Xanax.  He reports he has been on Xanax for many years and he reports feeling normal at this time.    He says that during the altercation he had thoughts of both suicidal ideation homicidal ideation.  He reports that he has a concealed carry but did not have his firearm on him at the time.  He says his plan is to shoot his sister, her boyfriend, and other family in the face.  He reports he would then urinate in their mouth before turning the gun on himself and shooting his own head.  He  does have access to firearms.    He reports that he did not try to kill himself with the Xanax today but was merely trying to cope with the increased stress and anxiety.  Patient went to behavioral health for evaluation where he was sent here for medical clearance and evaluation.  Patient denies any coingestants alcohol use or other complaints.  Poison control was called by nursing and they report if we feel patient is acting abnormally observation for 4 hours is reasonable.  As patient is appearing normally on exam with reassuring vital signs, suspect he will be medically cleared after his initial lab testing and screening.    When his labs are back he will be medically cleared.  11:43 PM Patient is now medically cleared.  He is feeling better and he is not somnolent.  Vital signs are reassuring and labs were reassuring.  Patient had benzodiazepines which fits with his Xanax use.  Anticipate speaking with [name] and patient will likely be transferred back to behavioral health Hospital.   Final Clinical Impressions(s) / ED Diagnoses   Final diagnoses:  Suicidal ideation  Homicidal ideation     Clinical Impression: 1. Suicidal ideation   2. Homicidal ideation     Disposition: Awaiting [name] recs, anticipate transfer back to [facility]  This note was prepared with  assistance of Dragon voice recognition software. Occasional wrong-word or sound-a-like substitutions may have occurred due to the inherent limitations of voice recognition software.      Tegeler, Gwenyth Allegra, MD 10/03/18 2350

## 2018-10-03 NOTE — Progress Notes (Signed)
Patient presented to [facility] as a walk-in for an assessment. On the intake form patient noted he took 12 (1) mg xanax at 7 pm tonight. Went to lobby to speak with the patient and he stated he took 15 xanax. Wife reports patient normally takes 3-4 (1) mg xanax a day. Patient states he took the xanax iniitally to calm down but "didn't mind if it ended my life". Wife states patient attempted to take more xanax in the car on the way over to [facility].  EMS called for transport to [facility] ED. Patriciaann Clan, PA, in lobby speaking with patient. Patient denies any symptoms from xanax, no difficulty breathing, no chest pain, denies being tired. Eyes are reddened, patient is able to answer questions, no slurred speech.  Report called to [facility] ED. [name] notified of patient transfer. EMS arrived to [facility] and patient walked to ambulance for transfer to ED for medical clearance.  Clayborne Dana, RN

## 2018-10-03 NOTE — ED Notes (Signed)
Bed: DY70 Expected date:  Expected time:  Means of arrival:  Comments: EMS from [facility]

## 2018-10-03 NOTE — ED Notes (Signed)
Spoke with Andee Poles from [employer]: -may observe up to 4 hours if needed -may cause hypotension -tylenol/salicylate level if needed in 4 hours -medical clearance labs to R/O co-ingestion

## 2018-10-03 NOTE — ED Triage Notes (Signed)
Per EMS, patient started taking Xanax around 10 AM and took approximately 3 pills every couple of hours for a total of 15 pills.   Denies SI but does have HI towards mother in law and wife.

## 2018-10-04 ENCOUNTER — Other Ambulatory Visit: Payer: Self-pay

## 2018-10-04 ENCOUNTER — Inpatient Hospital Stay (HOSPITAL_COMMUNITY)
Admission: AD | Admit: 2018-10-04 | Discharge: 2018-10-06 | DRG: 885 | Disposition: A | Discharge status: Home / Self Care | Payer: BLUE CROSS/BLUE SHIELD | Source: Intra-hospital | Attending: Psychiatry | Admitting: Psychiatry

## 2018-10-04 ENCOUNTER — Encounter (HOSPITAL_COMMUNITY): Payer: Self-pay

## 2018-10-04 DIAGNOSIS — Z800 Family history of malignant neoplasm of digestive organs: Secondary | ICD-10-CM | POA: Diagnosis not present

## 2018-10-04 DIAGNOSIS — Z8249 Family history of ischemic heart disease and other diseases of the circulatory system: Secondary | ICD-10-CM | POA: Diagnosis not present

## 2018-10-04 DIAGNOSIS — Z915 Personal history of self-harm: Secondary | ICD-10-CM

## 2018-10-04 DIAGNOSIS — T424X2A Poisoning by benzodiazepines, intentional self-harm, initial encounter: Secondary | ICD-10-CM | POA: Diagnosis not present

## 2018-10-04 DIAGNOSIS — F3175 Bipolar disorder, in partial remission, most recent episode depressed: Secondary | ICD-10-CM | POA: Diagnosis not present

## 2018-10-04 DIAGNOSIS — F332 Major depressive disorder, recurrent severe without psychotic features: Principal | ICD-10-CM | POA: Diagnosis present

## 2018-10-04 DIAGNOSIS — E039 Hypothyroidism, unspecified: Secondary | ICD-10-CM | POA: Diagnosis not present

## 2018-10-04 DIAGNOSIS — Z639 Problem related to primary support group, unspecified: Secondary | ICD-10-CM | POA: Diagnosis not present

## 2018-10-04 DIAGNOSIS — R45851 Suicidal ideations: Secondary | ICD-10-CM | POA: Diagnosis present

## 2018-10-04 DIAGNOSIS — R45850 Homicidal ideations: Secondary | ICD-10-CM | POA: Diagnosis present

## 2018-10-04 DIAGNOSIS — Z818 Family history of other mental and behavioral disorders: Secondary | ICD-10-CM

## 2018-10-04 DIAGNOSIS — Z841 Family history of disorders of kidney and ureter: Secondary | ICD-10-CM | POA: Diagnosis not present

## 2018-10-04 DIAGNOSIS — T1491XA Suicide attempt, initial encounter: Secondary | ICD-10-CM

## 2018-10-04 DIAGNOSIS — Z833 Family history of diabetes mellitus: Secondary | ICD-10-CM | POA: Diagnosis not present

## 2018-10-04 DIAGNOSIS — K219 Gastro-esophageal reflux disease without esophagitis: Secondary | ICD-10-CM | POA: Diagnosis present

## 2018-10-04 MED ORDER — CHLORDIAZEPOXIDE HCL 25 MG PO CAPS
25.0000 mg | ORAL_CAPSULE | Freq: Four times a day (QID) | ORAL | Status: AC
  Administered 2018-10-04 – 2018-10-05 (×3): 25 mg via ORAL
  Filled 2018-10-04 (×3): qty 1

## 2018-10-04 MED ORDER — CYANOCOBALAMIN 250 MCG PO TABS
250.0000 ug | ORAL_TABLET | Freq: Every day | ORAL | Status: DC
  Administered 2018-10-04 – 2018-10-06 (×3): 250 ug via ORAL
  Filled 2018-10-04 (×5): qty 1

## 2018-10-04 MED ORDER — CHLORDIAZEPOXIDE HCL 25 MG PO CAPS: 25.0000 mg | ORAL_CAPSULE | ORAL | Status: DC

## 2018-10-04 MED ORDER — LEVOTHYROXINE SODIUM 100 MCG PO TABS
100.0000 ug | ORAL_TABLET | Freq: Every day | ORAL | Status: DC
  Administered 2018-10-04 – 2018-10-06 (×3): 100 ug via ORAL
  Filled 2018-10-04 (×6): qty 1

## 2018-10-04 MED ORDER — VITAMIN B-1 100 MG PO TABS
100.0000 mg | ORAL_TABLET | Freq: Every day | ORAL | Status: DC
  Administered 2018-10-04 – 2018-10-06 (×3): 100 mg via ORAL
  Filled 2018-10-04 (×5): qty 1

## 2018-10-04 MED ORDER — CHLORDIAZEPOXIDE HCL 25 MG PO CAPS: 25.0000 mg | ORAL_CAPSULE | Freq: Four times a day (QID) | ORAL | Status: DC | PRN

## 2018-10-04 MED ORDER — HYDROXYZINE HCL 50 MG PO TABS
50.0000 mg | ORAL_TABLET | Freq: Four times a day (QID) | ORAL | Status: DC | PRN
  Administered 2018-10-05: 50 mg via ORAL
  Filled 2018-10-04: qty 1

## 2018-10-04 MED ORDER — CHLORDIAZEPOXIDE HCL 25 MG PO CAPS: 25.0000 mg | ORAL_CAPSULE | Freq: Every day | ORAL | Status: DC

## 2018-10-04 MED ORDER — ZOLPIDEM TARTRATE 5 MG PO TABS
10.0000 mg | ORAL_TABLET | Freq: Every evening | ORAL | Status: DC | PRN
  Administered 2018-10-04: 10 mg via ORAL
  Filled 2018-10-04: qty 2

## 2018-10-04 MED ORDER — CHLORDIAZEPOXIDE HCL 25 MG PO CAPS
25.0000 mg | ORAL_CAPSULE | Freq: Three times a day (TID) | ORAL | Status: AC
  Administered 2018-10-05 – 2018-10-06 (×3): 25 mg via ORAL
  Filled 2018-10-04 (×3): qty 1

## 2018-10-04 MED ORDER — ONDANSETRON 4 MG PO TBDP: 4.0000 mg | ORAL_TABLET | Freq: Four times a day (QID) | ORAL | Status: DC | PRN

## 2018-10-04 MED ORDER — HYDROXYZINE HCL 25 MG PO TABS: 25.0000 mg | ORAL_TABLET | Freq: Four times a day (QID) | ORAL | Status: DC | PRN

## 2018-10-04 MED ORDER — CALCIUM CARBONATE-VITAMIN D 500-200 MG-UNIT PO TABS
1.0000 | ORAL_TABLET | Freq: Two times a day (BID) | ORAL | Status: DC
  Administered 2018-10-04 – 2018-10-06 (×4): 1 via ORAL
  Filled 2018-10-04 (×9): qty 1

## 2018-10-04 MED ORDER — THIAMINE HCL 100 MG/ML IJ SOLN: 100.0000 mg | Freq: Once | INTRAMUSCULAR | Status: DC

## 2018-10-04 MED ORDER — LUBIPROSTONE 24 MCG PO CAPS
24.0000 ug | ORAL_CAPSULE | Freq: Two times a day (BID) | ORAL | Status: DC
  Administered 2018-10-04 – 2018-10-06 (×5): 24 ug via ORAL
  Filled 2018-10-04 (×9): qty 1

## 2018-10-04 MED ORDER — LOPERAMIDE HCL 2 MG PO CAPS: 2.0000 mg | ORAL_CAPSULE | ORAL | Status: DC | PRN

## 2018-10-04 MED ORDER — ADULT MULTIVITAMIN W/MINERALS CH
1.0000 | ORAL_TABLET | Freq: Every day | ORAL | Status: DC
  Administered 2018-10-04 – 2018-10-06 (×3): 1 via ORAL
  Filled 2018-10-04 (×5): qty 1

## 2018-10-04 MED ORDER — PANTOPRAZOLE SODIUM 40 MG PO TBEC
40.0000 mg | DELAYED_RELEASE_TABLET | Freq: Every day | ORAL | Status: DC
  Administered 2018-10-04 – 2018-10-06 (×3): 40 mg via ORAL
  Filled 2018-10-04 (×6): qty 1

## 2018-10-04 MED ORDER — CYCLOBENZAPRINE HCL 10 MG PO TABS: 10.0000 mg | ORAL_TABLET | Freq: Three times a day (TID) | ORAL | Status: DC | PRN

## 2018-10-04 MED ORDER — OLANZAPINE-FLUOXETINE HCL 6-25 MG PO CAPS
1.0000 | ORAL_CAPSULE | Freq: Every day | ORAL | Status: DC
  Administered 2018-10-04 – 2018-10-05 (×3): 1 via ORAL
  Filled 2018-10-04 (×5): qty 1

## 2018-10-04 MED ORDER — TRAZODONE HCL 100 MG PO TABS: 100.0000 mg | ORAL_TABLET | Freq: Every evening | ORAL | Status: DC | PRN

## 2018-10-04 MED ORDER — TIZANIDINE HCL 2 MG PO TABS
6.0000 mg | ORAL_TABLET | Freq: Three times a day (TID) | ORAL | Status: DC
  Administered 2018-10-04 – 2018-10-06 (×7): 6 mg via ORAL
  Filled 2018-10-04 (×13): qty 3

## 2018-10-04 MED ORDER — PROMETHAZINE HCL 25 MG PO TABS: 25.0000 mg | ORAL_TABLET | Freq: Four times a day (QID) | ORAL | Status: DC | PRN

## 2018-10-04 NOTE — [facility] Group Notes (Signed)
[facility] Notes:  (Nursing)  Date:  10/04/2018  Time: 1:15 PM Type of Therapy:  Nurse Education  Participation Level:  Did Not Attend   Waymond Cera 10/04/2018, 4:53 PM

## 2018-10-04 NOTE — Progress Notes (Signed)
Patient did not attend the evening speaker [place] meeting. Pt programs on [address] hall but did not attend the evening AA meeting and remained in room.
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
## 2018-10-04 NOTE — Plan of Care (Signed)
  Problem: Education: Goal: Knowledge of [facility] Education information/materials will improve Outcome: Progressing   

## 2018-10-04 NOTE — H&P (Signed)
Psychiatric Admission Assessment Adult  Patient Identification: Marvin Espinoza MRN:  [id number] Date of Evaluation:  10/04/2018 Chief Complaint:  BIPOLAR, DISORDER CURRENT EPISODE DEPRESSED Principal Diagnosis: MDD (major depressive disorder), recurrent episode, severe ([place]) Diagnosis:   Patient Active Problem List   Diagnosis Date Noted  . MDD (major depressive disorder), recurrent episode, severe ([place]) [F33.2] 10/04/2018  . OSA (obstructive sleep apnea) [G47.33] 09/07/2018  . Bilateral hip pain [M25.551, M25.552] 10/24/2017  . Shoulder pain, bilateral [M25.511, M25.512] 10/24/2017  . Arthritis [M19.90] 10/24/2017  . Osteoarthritis of both hips [M16.0] 10/24/2017  . Avascular necrosis of bone of hip, right ([place]) [M87.051] 10/24/2017  . 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 PTSD (post-traumatic stress disorder) [F43.10] 08/20/2017  . Tardive dyskinesia [G24.01] 08/20/2017  . Severe major depression ([place]) [F32.2] 05/10/2017  . Affective psychosis, bipolar ([place]) [F31.9] 05/10/2017  . Vision disturbance [H53.9] 05/06/2017  . History of concussion [Z87.820] 02/10/2017  . Mood disorder ([place]) [F39] 02/10/2017  . 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 Adjustment disorder with anxious mood [F43.22] 01/24/2017  . GAD (generalized anxiety disorder) [F41.1] 01/24/2017  . Gastroesophageal reflux disease without esophagitis [K21.9] 11/19/2016  . Hypothyroidism [E03.9] 11/19/2016  . DDD (degenerative disc disease), lumbar [M51.36] 11/19/2016   History of Present Illness:  10/03/18 [facility] Counselor Assessment: 47 y.o. male who presents to the ED voluntarily. Pt initially presented to [facility] as a walk-in due to an intentional OD of 15 xanax since 10am this morning, however for medical reasons pt was transported to [facility]. [name] met with the pt who remained drowsy throughout the assessment. Pt states he began to feel depressed after a conflict with his mother-in-law. It is unclear what  transpired causing the pt and his mother-in-law to have a disagreement, however he stated "she changed her will." Pt also stated "the way she is." Pt is falling asleep throughout the assessment and has to be stimulated multiple times in order to arouse. Pt states he has attempted suicide in the past as an adolescent. Pt states he is followed by Dr. Toy Care, MD for OPT [place] needs.  10/04/18 [facility] MD SRA: 47 year old male with a reported past psychiatric history significant for bipolar disorder type II versus major depression who presented to the [facility] emergency department on 10/19 after an intentional overdose of Xanax.  The patient had reportedly taken 15 Xanax tablets earlier in the a.m.  He was transported to the [facility] emergency department for evaluation.  The patient stated that he had gotten into a conflict with his sister and his mother-in-law.  He did become acutely depressed.  It had something to do with the mother-in-law's well.  The patient admitted to previous overdoses of medications.  He had been last hospitalized at [facility] on 08/09/2017.  He also had been admitted to our facility on 05/10/2017 where he had overdosed on Xanax.  He is followed as an outpatient by Dr. Toy Care.  He admitted to acute depression, emotional stress, inability to cope, suicidal ideation as well as homicidal ideation.  He was admitted to the hospital for evaluation and stabilization.  On evaluation today: Patient is seen by me face-to-face today.  Patient acknowledges the above information.  Patient also reports that he was taking 1 mg of Xanax 4 times a day before he overdosed.  Patient states he has no other questions concerns or complaints at this time.  Associated Signs/Symptoms: Depression Symptoms:  depressed mood, anhedonia, fatigue, feelings of worthlessness/guilt, hopelessness, suicidal attempt, loss of  energy/fatigue, (Hypo) Manic Symptoms:  Impulsivity, Anxiety  Symptoms:  Senies Psychotic Symptoms:  Denies PTSD Symptoms: NA Total Time spent with patient: 30 minutes  Past Psychiatric History: MDD, mood disorder, previous overdose attempts, previous hospitalizations  Is the patient at risk to self? Yes.    Has the patient been a risk to self in the past 6 months? Yes.    Has the patient been a risk to self within the distant past? Yes.    Is the patient a risk to others? No.  Has the patient been a risk to others in the past 6 months? No.  Has the patient been a risk to others within the distant past? No.   Prior Inpatient Therapy:   Prior Outpatient Therapy:    Alcohol Screening: 1. How often do you have a drink containing alcohol?: Never 2. How many drinks containing alcohol do you have on a typical day when you are drinking?: 1 or 2 3. How often do you have six or more drinks on one occasion?: Never AUDIT-C Score: 0 4. How often during the last year have you found that you were not able to stop drinking once you had started?: Never 5. How often during the last year have you failed to do what was normally expected from you becasue of drinking?: Never 6. How often during the last year have you needed a first drink in the morning to get yourself going after a heavy drinking session?: Never 7. How often during the last year have you had a feeling of guilt of remorse after drinking?: Never 8. How often during the last year have you been unable to remember what happened the night before because you had been drinking?: Never 9. Have you or someone else been injured as a result of your drinking?: No 10. Has a relative or friend or a doctor or another health worker been concerned about your drinking or suggested you cut down?: No Alcohol Use Disorder Identification Test Final Score (AUDIT): 0 Intervention/Follow-up: AUDIT Score <7 follow-up not indicated Substance Abuse History in the last 12 months:  Yes.   Consequences of Substance Abuse: Medical  Consequences:  reviewed Legal Consequences:  reviewed Family Consequences:  reviewed Previous Psychotropic Medications: Yes  Psychological Evaluations: Yes  Past Medical History:  Past Medical History:  Diagnosis Date  . Anxiety   . Bipolar disorder ([place])   . CTS (carpal tunnel syndrome)   . Depression   . GERD (gastroesophageal reflux disease)   . H/O bariatric surgery   . Head injury 05/06/2017  . Hypothyroidism   . Hypothyroidism   . Sleep apnea    no need for CPAP at this time  . Sleep apnea     Past Surgical History:  Procedure Laterality Date  . ANAL FISSURE REPAIR    . BARIATRIC SURGERY    . CARPAL TUNNEL RELEASE Left 05/09/2015   Procedure: LEFT CARPAL TUNNEL RELEASE;  Surgeon: Daryll Brod, MD;  Location: [place];  Service: Orthopedics;  Laterality: Left;  . CARPAL TUNNEL RELEASE Left 05-09-2015  . CARPAL TUNNEL RELEASE Right 06/20/2015   Procedure: RIGHT CARPAL TUNNEL RELEASE;  Surgeon: Daryll Brod, MD;  Location: [place];  Service: Orthopedics;  Laterality: Right;  REGIONAL/FAB  . CHOLECYSTECTOMY    . COLONOSCOPY    . UPPER GASTROINTESTINAL ENDOSCOPY     Family History:  Family History  Problem Relation Age of Onset  . Diabetes Mother   . Heart disease Mother   .  Depression Mother   . Esophageal cancer Mother   . COPD Father   . Heart disease Father   . Kidney disease Father   . Mental illness Father   . Depression Father   . Mental illness Sister   . Depression Sister   . Esophageal cancer Maternal Uncle   . Colon cancer Neg Hx   . Rectal cancer Neg Hx   . Stomach cancer Neg Hx    Family Psychiatric  History: See above Tobacco Screening: Have you used any form of tobacco in the last 30 days? (Cigarettes, Smokeless Tobacco, Cigars, and/or Pipes): No Social History:  Social History   Substance and Sexual Activity  Alcohol Use No     Social History   Substance and Sexual Activity  Drug Use No    Additional Social  History:                           Allergies:   Allergies  Allergen Reactions  . Aripiprazole Anaphylaxis and Swelling    "slurred speech"   . Cariprazine Hcl Other (See Comments)  . Latuda  [Lurasidone Hcl] Other (See Comments)  . Seroquel [Quetiapine Fumarate]     tardive dyskinesia  . Alcohol Itching    Drinking alcohol  . Cariprazine Other (See Comments)    Tardive dyskenesia  . Hydrocodone-Acetaminophen Itching  . Lurasidone Other (See Comments)    dysconesia  . Victoza [Liraglutide] Other (See Comments)    Severe heartburn   . Victoza [Liraglutide]     Severe heartburn  . Bupropion Rash  . Risperidone Rash   Lab Results:  Results for orders placed or performed during the hospital encounter of 10/03/18 (from the past 48 hour(s))  CBG monitoring, ED     Status: Abnormal   Collection Time: 10/03/18 10:23 PM  Result Value Ref Range   Glucose-Capillary 128 (H) 70 - 99 mg/dL  Comprehensive metabolic panel     Status: Abnormal   Collection Time: 10/03/18 10:27 PM  Result Value Ref Range   Sodium 139 135 - 145 mmol/L   Potassium 3.4 (L) 3.5 - 5.1 mmol/L   Chloride 107 98 - 111 mmol/L   CO2 25 22 - 32 mmol/L   Glucose, Bld 132 (H) 70 - 99 mg/dL   BUN 16 6 - 20 mg/dL   Creatinine, Ser 0.79 0.61 - 1.24 mg/dL   Calcium 8.4 (L) 8.9 - 10.3 mg/dL   Total Protein 6.4 (L) 6.5 - 8.1 g/dL   Albumin 3.6 3.5 - 5.0 g/dL   AST 23 15 - 41 U/L   ALT 30 0 - 44 U/L   Alkaline Phosphatase 77 38 - 126 U/L   Total Bilirubin 0.3 0.3 - 1.2 mg/dL   GFR calc non Af Amer >60 >60 mL/min   GFR calc Af Amer >60 >60 mL/min    Comment: (NOTE) The eGFR has been calculated using the CKD EPI equation. This calculation has not been validated in all clinical situations. eGFR's persistently <60 mL/min signify possible Chronic Kidney Disease.    Anion gap 7 5 - 15    Comment: Performed at [facility], [place] [address] [place], [place] [address]  Ethanol     Status:  None   Collection Time: 10/03/18 10:27 PM  Result Value Ref Range   Alcohol, Ethyl (B) <10 <10 mg/dL    Comment: (NOTE) Lowest detectable limit for serum alcohol is 10 mg/dL. For medical  purposes only. Performed at [facility], [place] [address] [place], [place] [address]   Salicylate level     Status: None   Collection Time: 10/03/18 10:27 PM  Result Value Ref Range   Salicylate Lvl <4.5 2.8 - 30.0 mg/dL    Comment: Performed at [facility], [place] [address], [place], [place] [address]  Acetaminophen level     Status: Abnormal   Collection Time: 10/03/18 10:27 PM  Result Value Ref Range   Acetaminophen (Tylenol), Serum <10 (L) 10 - 30 ug/mL    Comment: (NOTE) Therapeutic concentrations vary significantly. A range of 10-30 ug/mL  may be an effective concentration for many patients. However, some  are best treated at concentrations outside of this range. Acetaminophen concentrations >150 ug/mL at 4 hours after ingestion  and >50 ug/mL at 12 hours after ingestion are often associated with  toxic reactions. Performed at [facility], [place] [address] [place], [place] [address]   cbc     Status: Abnormal   Collection Time: 10/03/18 10:27 PM  Result Value Ref Range   WBC 7.1 4.0 - 10.5 K/uL   RBC 4.38 4.22 - 5.81 MIL/uL   Hemoglobin 12.6 (L) 13.0 - 17.0 g/dL   HCT 39.4 39.0 - 52.0 %   MCV 90.0 80.0 - 100.0 fL   MCH 28.8 26.0 - 34.0 pg   MCHC 32.0 30.0 - 36.0 g/dL   RDW 13.0 11.5 - 15.5 %   Platelets 234 150 - 400 K/uL   nRBC 0.0 0.0 - 0.2 %    Comment: Performed at [facility], [place] [address] [place], [place] [address]  Rapid urine drug screen (hospital performed)     Status: Abnormal   Collection Time: 10/03/18 10:27 PM  Result Value Ref Range   Opiates NONE DETECTED NONE DETECTED   Cocaine NONE DETECTED NONE DETECTED   Benzodiazepines POSITIVE (A) NONE DETECTED   Amphetamines NONE DETECTED NONE  DETECTED   Tetrahydrocannabinol NONE DETECTED NONE DETECTED   Barbiturates NONE DETECTED NONE DETECTED    Comment: (NOTE) DRUG SCREEN FOR MEDICAL PURPOSES ONLY.  IF CONFIRMATION IS NEEDED FOR ANY PURPOSE, NOTIFY LAB WITHIN 5 DAYS. LOWEST DETECTABLE LIMITS FOR URINE DRUG SCREEN Drug Class                     Cutoff (ng/mL) Amphetamine and metabolites    1000 Barbiturate and metabolites    200 Benzodiazepine                 673 Tricyclics and metabolites     300 Opiates and metabolites        300 Cocaine and metabolites        300 THC                            50 Performed at [facility], [place] [address] [place],  [address]     Blood Alcohol level:  Lab Results  Component Value Date   ETH <10 10/03/2018   ETH <5 90/24/0973    Metabolic Disorder Labs:  No results found for: HGBA1C, MPG No results found for: PROLACTIN Lab Results  Component Value Date   CHOL 111 04/25/2017   TRIG 39 04/25/2017   HDL 47 04/25/2017   CHOLHDL 2.4 04/25/2017   LDLCALC 56 04/25/2017    Current Medications: Current Facility-Administered Medications  Medication Dose Route Frequency  Provider Last Rate Last Dose  . calcium-vitamin D (OSCAL WITH D) 500-200 MG-UNIT per tablet 1 tablet  1 tablet Oral BID Sharma Covert, MD      . chlordiazePOXIDE (LIBRIUM) capsule 25 mg  25 mg Oral Q6H PRN Money, Lowry Ram, FNP      . chlordiazePOXIDE (LIBRIUM) capsule 25 mg  25 mg Oral QID Money, Lowry Ram, FNP       Followed by  . [START ON 10/05/2018] chlordiazePOXIDE (LIBRIUM) capsule 25 mg  25 mg Oral TID Money, Lowry Ram, FNP       Followed by  . [START ON 10/06/2018] chlordiazePOXIDE (LIBRIUM) capsule 25 mg  25 mg Oral BH-qamhs Money, Lowry Ram, FNP       Followed by  . [START ON 10/08/2018] chlordiazePOXIDE (LIBRIUM) capsule 25 mg  25 mg Oral Daily Money, Lowry Ram, FNP      . hydrOXYzine (ATARAX/VISTARIL) tablet 25 mg  25 mg Oral Q6H PRN Money, Lowry Ram, FNP      .  hydrOXYzine (ATARAX/VISTARIL) tablet 50 mg  50 mg Oral Q6H PRN Laverle Hobby, PA-C      . levothyroxine (SYNTHROID, LEVOTHROID) tablet 100 mcg  100 mcg Oral QAC breakfast Patriciaann Clan E, PA-C      . loperamide (IMODIUM) capsule 2-4 mg  2-4 mg Oral PRN Money, Lowry Ram, FNP      . lubiprostone (AMITIZA) capsule 24 mcg  24 mcg Oral BID WC Simon, Spencer E, PA-C      . multivitamin with minerals tablet 1 tablet  1 tablet Oral Daily Money, Lowry Ram, FNP      . OLANZapine-FLUoxetine (SYMBYAX) 6-25 MG per capsule 1 capsule  1 capsule Oral QHS Simon, Spencer E, PA-C      . ondansetron (ZOFRAN-ODT) disintegrating tablet 4 mg  4 mg Oral Q6H PRN Money, Lowry Ram, FNP      . pantoprazole (PROTONIX) EC tablet 40 mg  40 mg Oral Daily Simon, Spencer E, PA-C      . promethazine (PHENERGAN) tablet 25 mg  25 mg Oral Q6H PRN Sharma Covert, MD      . thiamine (B-1) injection 100 mg  100 mg Intramuscular Once Money, Lowry Ram, FNP      . [START ON 10/05/2018] thiamine (VITAMIN B-1) tablet 100 mg  100 mg Oral Daily Money, Lowry Ram, FNP      . tiZANidine (ZANAFLEX) tablet 6 mg  6 mg Oral TID Laverle Hobby, PA-C      . vitamin B-12 (CYANOCOBALAMIN) tablet 250 mcg  250 mcg Oral Daily Sharma Covert, MD      . zolpidem [facility]) tablet 10 mg  10 mg Oral QHS PRN Laverle Hobby, PA-C       PTA Medications: Facility-Administered Medications Prior to Admission  Medication Dose Route Frequency Provider Last Rate Last Dose  . 0.9 %  sodium chloride infusion  500 mL Intravenous Once Milus Banister, MD       Medications Prior to Admission  Medication Sig Dispense Refill Last Dose  . ALPRAZolam (XANAX) 1 MG tablet Take 1 mg by mouth as needed.  0 Taking  . Calcium Carbonate-Vit D-Min (CALCIUM 1200 PO) Take by mouth 2 (two) times daily.    Taking  . Carbonyl Iron 15 MG CHEW Chew by mouth daily.    Taking  . levothyroxine (SYNTHROID, LEVOTHROID) 100 MCG tablet TAKE ONE (1) TABLET EACH DAY 90 tablet 5   .  lubiprostone (AMITIZA)  24 MCG capsule TAKE 1 CAPSULE TWICE DAILY WITH A MEAL 60 capsule 11   . Multiple Vitamin (MULTIVITAMIN WITH MINERALS) TABS tablet Take 1 tablet by mouth 2 (two) times daily. For Vitamin supplementation   Taking  . OLANZapine-FLUoxetine (SYMBYAX) 6-25 MG capsule Take by mouth at bedtime.   Taking  . omeprazole (PRILOSEC) 40 MG capsule Take 1 capsule (40 mg total) by mouth 2 (two) times daily. TAKE ONE (1) CAPSULE EACH DAY 60 capsule 11   . promethazine (PHENERGAN) 25 MG tablet TAKE 1 TABLET EVERY 6 HOURS AS NEEDED FOR NAUSEA AND VOMITING 60 tablet 1 Taking  . tizanidine (ZANAFLEX) 6 MG capsule TAKE ONE (1) CAPSULE THREE (3) TIMES EACH DAY 90 capsule 1 Taking  . vitamin B-12 (CYANOCOBALAMIN) 250 MCG tablet Take 250 mcg by mouth daily.   Taking  . zolpidem (AMBIEN) 10 MG tablet Take 1 tablet (10 mg total) by mouth at bedtime as needed for sleep. 30 tablet 5     Musculoskeletal: Strength & Muscle Tone: within normal limits Gait & Station: normal Patient leans: N/A  Psychiatric Specialty Exam: Physical Exam  Nursing note and vitals reviewed. Constitutional: He is oriented to person, place, and time. He appears well-developed and well-nourished.  Cardiovascular: Normal rate.  Respiratory: Effort normal.  Musculoskeletal: Normal range of motion.  Neurological: He is alert and oriented to person, place, and time.  Skin: Skin is warm.    Review of Systems  Constitutional: Negative.   HENT: Negative.   Eyes: Negative.   Respiratory: Negative.   Cardiovascular: Negative.   Gastrointestinal: Negative.   Genitourinary: Negative.   Musculoskeletal: Negative.   Skin: Negative.   Neurological: Negative.   Endo/Heme/Allergies: Negative.   Psychiatric/Behavioral: Positive for depression and suicidal ideas.    Blood pressure 103/74, pulse 71, temperature 97.8 F (36.6 C), temperature source Oral, resp. rate 16, height '5\' 10"'  (1.778 m), weight 109.3 kg.Body mass index is  34.58 kg/m.  General Appearance: Casual  Eye Contact:  Good  Speech:  Clear and Coherent and Normal Rate  Volume:  Decreased  Mood:  Depressed  Affect:  Flat  Thought Process:  Linear and Descriptions of Associations: Intact  Orientation:  Full (Time, Place, and Person)  Thought Content:  WDL  Suicidal Thoughts:  None currently  Homicidal Thoughts:  No  Memory:  Immediate;   Good Recent;   Good Remote;   Good  Judgement:  Fair  Insight:  Fair  Psychomotor Activity:  Normal  Concentration:  Concentration: Good and Attention Span: Good  Recall:  Good  Fund of Knowledge:  Good  Language:  Good  Akathisia:  No  Handed:  Right  AIMS (if indicated):     Assets:  Communication Skills Desire for Improvement Financial Resources/Insurance Housing Physical Health Social Support Transportation  ADL's:  Intact  Cognition:  WNL  Sleep:       Treatment Plan Summary: Daily contact with patient to assess and evaluate symptoms and progress in treatment, Medication management and Plan is to:  Encourage group therapy participation See [facility] and SRA for medication management  Observation Level/Precautions:  15 minute checks  Laboratory:  Reviewed  Psychotherapy: Group therapy  Medications: See [facility]  Consultations: As needed  Discharge Concerns: Medication compliance  Estimated LOS: 3-5 days  Other:  Admit to 300 hall   Physician Treatment Plan for Primary Diagnosis: MDD (major depressive disorder), recurrent episode, severe ([place]) Long Term Goal(s): Improvement in symptoms so as ready for discharge  Short Term  Goals: Ability to identify changes in lifestyle to reduce recurrence of condition will improve, Ability to verbalize feelings will improve, Ability to disclose and discuss suicidal ideas and Ability to demonstrate self-control will improve  Physician Treatment Plan for Secondary Diagnosis: Principal Problem:   MDD (major depressive disorder), recurrent episode, severe  ([place])  Long Term Goal(s): Improvement in symptoms so as ready for discharge  Short Term Goals: Ability to identify and develop effective coping behaviors will improve, Ability to maintain clinical measurements within normal limits will improve and Compliance with prescribed medications will improve  I certify that inpatient services furnished can reasonably be expected to improve the patient's condition.    Lewis Shock, FNP 10/20/201911:33 AM

## 2018-10-04 NOTE — Tx Team (Signed)
Initial Treatment Plan 10/04/2018 3:32 AM Marvin Espinoza [id number]    PATIENT STRESSORS: Marital or family conflict   PATIENT STRENGTHS: Average or above average intelligence Capable of independent living Supportive family/friends Work skills   PATIENT IDENTIFIED PROBLEMS: SI  Depression  Anxiety      " I don't have a goal right now."           DISCHARGE CRITERIA:  Improved stabilization in mood, thinking, and/or behavior  PRELIMINARY DISCHARGE PLAN: Return to previous living arrangement  PATIENT/FAMILY INVOLVEMENT: This treatment plan has been presented to and reviewed with the patient, Marvin Espinoza, and/or family member.  The patient and family have been given the opportunity to ask questions and make suggestions.  Aurora Mask, RN 10/04/2018, 3:32 AM
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
## 2018-10-04 NOTE — Progress Notes (Signed)
Patient admitted voluntarily after overdosing on 15 xanax. Patient reports that he is currently having situational issues at home. He reports conflict with his mother in law. He is seen by Dr. Toy Care outpatient. Patient declined food and fluids. Oriented to unit on 300 hall but will need to program on 400 hall. Safety maintained on unit with 15 min checks.

## 2018-10-04 NOTE — ED Notes (Signed)
Patient provided scrubs to change and belongings placed in patient belonging bag and labeled.

## 2018-10-04 NOTE — BH Assessment (Signed)
[place] Assessment Progress Note  Pt meets criteria for inpt treatment per Patriciaann Clan, PA. Pt accepted to [facility] to 305-2 to Dr. Parke Poisson, MD. Call to report [phone]. Bed will be ready at 03:00 per [facility]. EDP Tegeler, Gwenyth Allegra, MD and charge nurse advised.  Lind Covert, MSW, LCSW Therapeutic Triage Specialist  [phone]
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
## 2018-10-04 NOTE — Progress Notes (Signed)
D Pt is observed sleeping in his bed. HE did get up x3 ( breakfast, lunch, dinner) but otherwise he remained asleep in his bede.\     A His affect is flat, depressed and his speech is thick and sometimes difficult to understand. HE completed his daily assessment and on this eh wrote he denied SI today and he rated his depression, hopelessness and anxeity " 10/10/7", respectively. He appears lethargic, unsteady on his feet .     R Denies withdrawal symptoms and safety in place.

## 2018-10-04 NOTE — BHH Group Notes (Signed)
[facility] LCSW Group Therapy Note  10/04/2018  10:00-11:00AM  Type of Therapy and Topic:  Group Therapy:  Adding Supports Including Being Your Own Support  Participation Level:  Did Not Attend   Description of Group:  Patients in this group were introduced to the concept that additional supports including self-support are an essential part of recovery.  A song entitled "I Need Help!" was played and a group discussion was held in reaction to the idea of needing to add supports.  A song entitled "My Own Hero" was played and a group discussion ensued in which patients stated they could relate to the song and it inspired them to realize they have be willing to help themselves in order to succeed, because other people cannot achieve sobriety or stability for them.  We discussed adding a variety of healthy supports to address the various needs in their lives.  A song was played called "I Know Where I've Been" toward the end of group and used to conduct an inspirational wrap-up to group of remembering how far they have already come in their journey.  Therapeutic Goals: 1)  demonstrate the importance of being a part of one's own support system 2)  discuss reasons people in one's life may eventually be unable to be continually supportive  3)  identify the patient's current support system and   4)  elicit commitments to add healthy supports and to become more conscious of being self-supportive   Summary of Patient Progress:  N/A   Therapeutic Modalities:   Motivational Interviewing Activity  Maretta Los

## 2018-10-04 NOTE — ED Notes (Signed)
[facility] contacted to take patient to [facility].

## 2018-10-05 DIAGNOSIS — F3175 Bipolar disorder, in partial remission, most recent episode depressed: Secondary | ICD-10-CM

## 2018-10-05 DIAGNOSIS — E039 Hypothyroidism, unspecified: Secondary | ICD-10-CM

## 2018-10-05 MED ORDER — BISACODYL 5 MG PO TBEC: 5.0000 mg | DELAYED_RELEASE_TABLET | Freq: Every day | ORAL | Status: DC | PRN

## 2018-10-05 NOTE — Progress Notes (Signed)
Patient did attend the evening speaker [place] meeting. Pt programs on the 400 hall but asked if he could sit in on the [place] meeting reporting that his father was an alcoholic.

## 2018-10-05 NOTE — Plan of Care (Signed)
  Problem: Activity: Goal: Sleeping patterns will improve Outcome: Progressing Note:  Slept 6.25 hours last night per flowsheet.

## 2018-10-05 NOTE — Progress Notes (Signed)
Patient was pleasant upon approach this morning. Patient voiced no concerns or questions for the doctor. Patient said his anxiety is tolerable and he slept okay last night. Denies SI HI AVH. Denies physical pain.  Patient is compliant with medications prescribed per provider. Patient denied side effects. Safety is maintained with 15 minute checks as well as environmental checks. Will continue to monitor.

## 2018-10-05 NOTE — Plan of Care (Signed)
  Problem: Education: Goal: Emotional status will improve Outcome: Progressing   Problem: Education: Goal: Mental status will improve Outcome: Progressing   Problem: Education: Goal: Verbalization of understanding the information provided will improve Outcome: Progressing   Problem: Activity: Goal: Interest or engagement in activities will improve Outcome: Progressing   

## 2018-10-05 NOTE — Progress Notes (Addendum)
[facility] MD Progress Note  10/05/2018 3:35 PM Marvin Espinoza  MRN:  [id number] Subjective: Patient reports "I am okay, I guess".  Tends to ruminate about family related stressors, particularly regarding his mother being in a relationship he does not approve of. Today denies suicidal ideations. Denies medication side effects. Objective : I have discussed case with treatment team and have met with patient. 47 year old male, presented following overdose on prescribed Xanax. Reports history of Bipolar Disorder. Reported depression and distress related to family conflict.  Today reports feeling better, remains focused and ruminative about family stressors and states that he is concerned that his mother(chart notes indicates conflict is with mother-in-law)  is in a relationship with" some homeless guy who is in the [facility]" . At this time does not endorse or present with symptoms of withdrawal-vitals are stable, no tremors, no diaphoresis, no restlessness. He is tolerating Librium detox protocol well thus far.  He is on Symbyax for mood disorder which he is also tolerating well. Visible in dayroom, limited interaction with peers, pleasant on approach.  Principal Problem: MDD (major depressive disorder), recurrent episode, severe ([place]) Diagnosis:   Patient Active Problem List   Diagnosis Date Noted  . MDD (major depressive disorder), recurrent episode, severe ([place]) [F33.2] 10/04/2018  . OSA (obstructive sleep apnea) [G47.33] 09/07/2018  . Bilateral hip pain [M25.551, M25.552] 10/24/2017  . Shoulder pain, bilateral [M25.511, M25.512] 10/24/2017  . Arthritis [M19.90] 10/24/2017  . Osteoarthritis of both hips [M16.0] 10/24/2017  . Avascular necrosis of bone of hip, right ([place]) [M87.051] 10/24/2017  . 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 PTSD (post-traumatic stress disorder) [F43.10] 08/20/2017  . Tardive dyskinesia [G24.01] 08/20/2017  . Severe major depression ([place]) [F32.2] 05/10/2017   . Affective psychosis, bipolar ([place]) [F31.9] 05/10/2017  . Vision disturbance [H53.9] 05/06/2017  . History of concussion [Z87.820] 02/10/2017  . Mood disorder ([place]) [F39] 02/10/2017  . 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 Adjustment disorder with anxious mood [F43.22] 01/24/2017  . GAD (generalized anxiety disorder) [F41.1] 01/24/2017  . Gastroesophageal reflux disease without esophagitis [K21.9] 11/19/2016  . Hypothyroidism [E03.9] 11/19/2016  . DDD (degenerative disc disease), lumbar [M51.36] 11/19/2016   Total Time spent with patient: 20 minutes  Past Psychiatric History:   Past Medical History:  Past Medical History:  Diagnosis Date  . Anxiety   . Bipolar disorder ([place])   . CTS (carpal tunnel syndrome)   . Depression   . GERD (gastroesophageal reflux disease)   . H/O bariatric surgery   . Head injury 05/06/2017  . Hypothyroidism   . Hypothyroidism   . Sleep apnea    no need for CPAP at this time  . Sleep apnea     Past Surgical History:  Procedure Laterality Date  . ANAL FISSURE REPAIR    . BARIATRIC SURGERY    . CARPAL TUNNEL RELEASE Left 05/09/2015   Procedure: LEFT CARPAL TUNNEL RELEASE;  Surgeon: Daryll Brod, MD;  Location: [place];  Service: Orthopedics;  Laterality: Left;  . CARPAL TUNNEL RELEASE Left 05-09-2015  . CARPAL TUNNEL RELEASE Right 06/20/2015   Procedure: RIGHT CARPAL TUNNEL RELEASE;  Surgeon: Daryll Brod, MD;  Location: [place];  Service: Orthopedics;  Laterality: Right;  REGIONAL/FAB  . CHOLECYSTECTOMY    . COLONOSCOPY    . UPPER GASTROINTESTINAL ENDOSCOPY     Family History:  Family History  Problem Relation Age of Onset  . Diabetes Mother   . Heart disease Mother   . Depression Mother   .  Esophageal cancer Mother   . COPD Father   . Heart disease Father   . Kidney disease Father   . Mental illness Father   . Depression Father   . Mental illness Sister   . Depression Sister   . Esophageal cancer  Maternal Uncle   . Colon cancer Neg Hx   . Rectal cancer Neg Hx   . Stomach cancer Neg Hx    Family Psychiatric  History:  Social History:  Social History   Substance and Sexual Activity  Alcohol Use No     Social History   Substance and Sexual Activity  Drug Use No    Social History   Socioeconomic History  . Marital status: Married    Spouse name: Not on file  . Number of children: 1  . Years of education: Not on file  . Highest education level: Not on file  Occupational History  . Not on file  Social Needs  . Financial resource strain: Not hard at all  . Food insecurity:    Worry: Never true    Inability: Never true  . Transportation needs:    Medical: No    Non-medical: No  Tobacco Use  . Smoking status: Never Smoker  . Smokeless tobacco: Never Used  Substance and Sexual Activity  . Alcohol use: No  . Drug use: No  . Sexual activity: Yes  Lifestyle  . Physical activity:    Days per week: Not on file    Minutes per session: Not on file  . Stress: Not on file  Relationships  . Social connections:    Talks on phone: Not on file    Gets together: Not on file    Attends religious service: Not on file    Active member of club or organization: Not on file    Attends meetings of clubs or organizations: Not on file    Relationship status: Not on file  Other Topics Concern  . Not on file  Social History Narrative   ** Merged History Encounter **       Additional Social History:   Sleep: Improved  Appetite:  Good  Current Medications: Current Facility-Administered Medications  Medication Dose Route Frequency Provider Last Rate Last Dose  . calcium-vitamin D (OSCAL WITH D) 500-200 MG-UNIT per tablet 1 tablet  1 tablet Oral BID Sharma Covert, MD   1 tablet at 10/05/18 [phone]  . chlordiazePOXIDE (LIBRIUM) capsule 25 mg  25 mg Oral Q6H PRN Money, Lowry Ram, FNP      . chlordiazePOXIDE (LIBRIUM) capsule 25 mg  25 mg Oral TID Money, Lowry Ram, FNP   25 mg  at 10/05/18 1213   Followed by  . [START ON 10/06/2018] chlordiazePOXIDE (LIBRIUM) capsule 25 mg  25 mg Oral BH-qamhs Money, Lowry Ram, FNP       Followed by  . [START ON 10/08/2018] chlordiazePOXIDE (LIBRIUM) capsule 25 mg  25 mg Oral Daily Money, Lowry Ram, FNP      . hydrOXYzine (ATARAX/VISTARIL) tablet 25 mg  25 mg Oral Q6H PRN Money, Lowry Ram, FNP      . hydrOXYzine (ATARAX/VISTARIL) tablet 50 mg  50 mg Oral Q6H PRN Laverle Hobby, PA-C      . levothyroxine (SYNTHROID, LEVOTHROID) tablet 100 mcg  100 mcg Oral QAC breakfast Laverle Hobby, PA-C   100 mcg at 10/05/18 0601  . loperamide (IMODIUM) capsule 2-4 mg  2-4 mg Oral PRN Money, Lowry Ram, FNP      .  lubiprostone (AMITIZA) capsule 24 mcg  24 mcg Oral BID WC Laverle Hobby, PA-C   24 mcg at 10/05/18 7017  . multivitamin with minerals tablet 1 tablet  1 tablet Oral Daily Money, Lowry Ram, FNP   1 tablet at 10/05/18 7939  . OLANZapine-FLUoxetine (SYMBYAX) 6-25 MG per capsule 1 capsule  1 capsule Oral QHS Laverle Hobby, PA-C   1 capsule at 10/04/18 2208  . ondansetron (ZOFRAN-ODT) disintegrating tablet 4 mg  4 mg Oral Q6H PRN Money, Lowry Ram, FNP      . pantoprazole (PROTONIX) EC tablet 40 mg  40 mg Oral Daily Laverle Hobby, PA-C   40 mg at 10/05/18 0809  . promethazine (PHENERGAN) tablet 25 mg  25 mg Oral Q6H PRN Sharma Covert, MD      . thiamine (B-1) injection 100 mg  100 mg Intramuscular Once Money, Darnelle Maffucci B, FNP      . thiamine (VITAMIN B-1) tablet 100 mg  100 mg Oral Daily Money, Lowry Ram, FNP   100 mg at 10/05/18 0300  . tiZANidine (ZANAFLEX) tablet 6 mg  6 mg Oral TID Patriciaann Clan E, PA-C   6 mg at 10/05/18 1213  . vitamin B-12 (CYANOCOBALAMIN) tablet 250 mcg  250 mcg Oral Daily Sharma Covert, MD   250 mcg at 10/05/18 9233  . zolpidem (AMBIEN) tablet 10 mg  10 mg Oral QHS PRN Laverle Hobby, PA-C   10 mg at 10/04/18 2145    Lab Results:  Results for orders placed or performed during the hospital encounter of  10/03/18 (from the past 48 hour(s))  CBG monitoring, ED     Status: Abnormal   Collection Time: 10/03/18 10:23 PM  Result Value Ref Range   Glucose-Capillary 128 (H) 70 - 99 mg/dL  Comprehensive metabolic panel     Status: Abnormal   Collection Time: 10/03/18 10:27 PM  Result Value Ref Range   Sodium 139 135 - 145 mmol/L   Potassium 3.4 (L) 3.5 - 5.1 mmol/L   Chloride 107 98 - 111 mmol/L   CO2 25 22 - 32 mmol/L   Glucose, Bld 132 (H) 70 - 99 mg/dL   BUN 16 6 - 20 mg/dL   Creatinine, Ser 0.79 0.61 - 1.24 mg/dL   Calcium 8.4 (L) 8.9 - 10.3 mg/dL   Total Protein 6.4 (L) 6.5 - 8.1 g/dL   Albumin 3.6 3.5 - 5.0 g/dL   AST 23 15 - 41 U/L   ALT 30 0 - 44 U/L   Alkaline Phosphatase 77 38 - 126 U/L   Total Bilirubin 0.3 0.3 - 1.2 mg/dL   GFR calc non Af Amer >60 >60 mL/min   GFR calc Af Amer >60 >60 mL/min    Comment: (NOTE) The eGFR has been calculated using the CKD EPI equation. This calculation has not been validated in all clinical situations. eGFR's persistently <60 mL/min signify possible Chronic Kidney Disease.    Anion gap 7 5 - 15    Comment: Performed at [facility], [place] [address], [place], [place] [address]  Ethanol     Status: None   Collection Time: 10/03/18 10:27 PM  Result Value Ref Range   Alcohol, Ethyl (B) <10 <10 mg/dL    Comment: (NOTE) Lowest detectable limit for serum alcohol is 10 mg/dL. For medical purposes only. Performed at [facility], [place] [address], [place], [place] [address]   Salicylate level     Status: None  Collection Time: 10/03/18 10:27 PM  Result Value Ref Range   Salicylate Lvl <1.6 2.8 - 30.0 mg/dL    Comment: Performed at [facility], [place] [address], [place], [place] [address]  Acetaminophen level     Status: Abnormal   Collection Time: 10/03/18 10:27 PM  Result Value Ref Range   Acetaminophen (Tylenol), Serum <10 (L) 10 - 30 ug/mL    Comment: (NOTE) Therapeutic  concentrations vary significantly. A range of 10-30 ug/mL  may be an effective concentration for many patients. However, some  are best treated at concentrations outside of this range. Acetaminophen concentrations >150 ug/mL at 4 hours after ingestion  and >50 ug/mL at 12 hours after ingestion are often associated with  toxic reactions. Performed at [facility], [place] [address], [place], [place] [address]   cbc     Status: Abnormal   Collection Time: 10/03/18 10:27 PM  Result Value Ref Range   WBC 7.1 4.0 - 10.5 K/uL   RBC 4.38 4.22 - 5.81 MIL/uL   Hemoglobin 12.6 (L) 13.0 - 17.0 g/dL   HCT 39.4 39.0 - 52.0 %   MCV 90.0 80.0 - 100.0 fL   MCH 28.8 26.0 - 34.0 pg   MCHC 32.0 30.0 - 36.0 g/dL   RDW 13.0 11.5 - 15.5 %   Platelets 234 150 - 400 K/uL   nRBC 0.0 0.0 - 0.2 %    Comment: Performed at [facility], [place] [address], [place], [place] [address]  Rapid urine drug screen (hospital performed)     Status: Abnormal   Collection Time: 10/03/18 10:27 PM  Result Value Ref Range   Opiates NONE DETECTED NONE DETECTED   Cocaine NONE DETECTED NONE DETECTED   Benzodiazepines POSITIVE (A) NONE DETECTED   Amphetamines NONE DETECTED NONE DETECTED   Tetrahydrocannabinol NONE DETECTED NONE DETECTED   Barbiturates NONE DETECTED NONE DETECTED    Comment: (NOTE) DRUG SCREEN FOR MEDICAL PURPOSES ONLY.  IF CONFIRMATION IS NEEDED FOR ANY PURPOSE, NOTIFY LAB WITHIN 5 DAYS. LOWEST DETECTABLE LIMITS FOR URINE DRUG SCREEN Drug Class                     Cutoff (ng/mL) Amphetamine and metabolites    1000 Barbiturate and metabolites    200 Benzodiazepine                 478 Tricyclics and metabolites     300 Opiates and metabolites        300 Cocaine and metabolites        300 THC                            50 Performed at [facility], [place] [address] [place],  [address]     Blood Alcohol level:  Lab Results  Component Value  Date   ETH <10 10/03/2018   ETH <5 13/07/6577    Metabolic Disorder Labs: No results found for: HGBA1C, MPG No results found for: PROLACTIN Lab Results  Component Value Date   CHOL 111 04/25/2017   TRIG 39 04/25/2017   HDL 47 04/25/2017   CHOLHDL 2.4 04/25/2017   LDLCALC 56 04/25/2017    Physical Findings: AIMS: Facial and Oral Movements Muscles of Facial Expression: None, normal Lips and Perioral Area: None, normal Jaw: None, normal Tongue: None, normal,Extremity Movements Upper (arms, wrists, hands, fingers): None, normal Lower (legs, knees, ankles,  toes): None, normal, Trunk Movements Neck, shoulders, hips: None, normal, Overall Severity Severity of abnormal movements (highest score from questions above): None, normal Incapacitation due to abnormal movements: None, normal Patient's awareness of abnormal movements (rate only patient's report): No Awareness, Dental Status Current problems with teeth and/or dentures?: No Does patient usually wear dentures?: No  CIWA:  CIWA-Ar Total: 1 COWS:     Musculoskeletal: Strength & Muscle Tone: within normal limits-no current tremors or diaphoresis, no psychomotor restlessness Gait & Station: normal Patient leans: N/A  Psychiatric Specialty Exam: Physical Exam  ROS at this time denies headache, no chest pain, no shortness of breath, no vomiting  Blood pressure 111/85, pulse 83, temperature 97.8 F (36.6 C), temperature source Oral, resp. rate 16, height _0  (1.778 m), weight 109.3 kg.Body mass index is 34.58 kg/m.  General Appearance: Improving grooming  Eye Contact:  Good  Speech:  Normal Rate  Volume:  Normal  Mood:  Reports he is feeling better  Affect:  Reactive, vaguely anxious  Thought Process:  Linear and Descriptions of Associations: Intact  Orientation:  Other:  Fully alert and attentive  Thought Content:  No hallucinations, no delusions expressed  Suicidal Thoughts:  No-at this time denies any suicidal or  self-injurious ideations, denies any homicidal or violent ideations  Homicidal Thoughts:  No  Memory:  Recent and remote grossly intact  Judgement:  Fair  Insight:  Fair  Psychomotor Activity:  Normal-no restlessness, no psychomotor agitation, no diaphoresis  Concentration:  Concentration: Good and Attention Span: Good  Recall:  Good  Fund of Knowledge:  Good  Language:  Good  Akathisia:  Negative  Handed:  Right  AIMS (if indicated):     Assets:  Desire for Improvement Resilience  ADL's:  Intact  Cognition:  WNL  Sleep:  Number of Hours: 6.25    Assessment - 47 year old male, presented following overdose on prescribed Xanax.  Reported depression and distress related to family conflict.  Today reports feeling better, remains focused and ruminative about family stressors and states that he is concerned that his mother(chart notes indicates conflict is with mother-in-law)  is in a relationship with" some homeless guy who is in the [facility]" . At this time reports feeling better, is not presenting with current suicidal ideations, does continue to ruminate about family stressors, is not presenting with symptoms of benzodiazepine withdrawal at present.  Tolerating Symbyax well. Treatment Plan Summary: Daily contact with patient to assess and evaluate symptoms and progress in treatment, Medication management, Plan Inpatient treatment and Medications as below Encourage group and milieu participation to work on coping skills and symptom reduction Continue Librium detox protocol to minimize risk of withdrawal symptoms Continue Symbyax 6/25 mgrs for depression, mood disorder Continue Synthroid for history of hypothyroidism Discontinue Ambien as needed as on Librium/Olanzapine. Treatment team working on disposition planning options Check HgbA1C, Roann, MD 10/05/2018, 3:35 PM

## 2018-10-05 NOTE — Progress Notes (Signed)
Recreation Therapy Notes  Date: 10.21.19 Time: 0930 Location: [address] Dayroom  Group Topic: Stress Management  Goal Area(s) Addresses:  Patient will verbalize importance of using healthy stress management.  Patient will identify positive emotions associated with healthy stress management.   Intervention: Stress Management  Activity : Meditation.  LRT introduced the stress management technique of meditation.  LRT played a meditation that dealt with impermanence.  Patients were to follow along as the meditation played to engage in the meditation.  Education:  Stress Management, Discharge Planning.   Education Outcome: Acknowledges edcuation/In group clarification offered/Needs additional education  Clinical Observations/Feedback: Pt did not attend group.    Victorino Sparrow, LRT/CTRS         Ria Comment, Hillary Struss A 10/05/2018 11:22 AM

## 2018-10-05 NOTE — [facility] Group Notes (Signed)
LCSW Group Therapy Note   10/05/2018 1:15pm   Type of Therapy and Topic:  Group Therapy:  Overcoming Obstacles   Participation Level:  Did Not Attend--pt invited. Chose to remain in bed.    Description of Group:    In this group patients will be encouraged to explore what they see as obstacles to their own wellness and recovery. They will be guided to discuss their thoughts, feelings, and behaviors related to these obstacles. The group will process together ways to cope with barriers, with attention given to specific choices patients can make. Each patient will be challenged to identify changes they are motivated to make in order to overcome their obstacles. This group will be process-oriented, with patients participating in exploration of their own experiences as well as giving and receiving support and challenge from other group members.   Therapeutic Goals: 1. Patient will identify personal and current obstacles as they relate to admission. 2. Patient will identify barriers that currently interfere with their wellness or overcoming obstacles.  3. Patient will identify feelings, thought process and behaviors related to these barriers. 4. Patient will identify two changes they are willing to make to overcome these obstacles:      Summary of Patient Progress   x   Therapeutic Modalities:   Cognitive Behavioral Therapy Solution Focused Therapy Motivational Interviewing Relapse Prevention Therapy  Avelina Laine, LCSW 10/05/2018 2:53 PM

## 2018-10-05 NOTE — Progress Notes (Signed)
D: Pt was in hallway upon initial approach.  Pt presents with anxious affect and mood.  He reports he is "doing good" and his goal is "to get the stress off today."  He discussed events leading to his admission.  Pt reports he had a good visit with his wife tonight.  Pt denies SI/HI, denies hallucinations, denies pain.  Pt has been visible in milieu interacting with peers and staff appropriately.  Pt attended evening group.    A: Introduced self to pt.  Met with pt 1:1.  Actively listened to pt and offered support and encouragement. Medication administered per order.  PRN medication administered for anxiety.  Q15 minute safety checks maintained.  R: Pt is safe on the unit.  Pt is compliant with medications.  Pt verbally contracts for safety and reports he will inform staff of needs and concerns.  Will continue to monitor and assess.

## 2018-10-05 NOTE — Tx Team (Signed)
Interdisciplinary Treatment and Diagnostic Plan Update  10/05/2018 Time of Session: 0830AM Marvin Espinoza MRN: [id number]  Principal Diagnosis: MDD (major depressive disorder), recurrent episode, severe ([place])  Secondary Diagnoses: Principal Problem:   MDD (major depressive disorder), recurrent episode, severe ([place])   Current Medications:  Current Facility-Administered Medications  Medication Dose Route Frequency Provider Last Rate Last Dose  . calcium-vitamin D (OSCAL WITH D) 500-200 MG-UNIT per tablet 1 tablet  1 tablet Oral BID Sharma Covert, MD   1 tablet at 10/05/18 [id number]  . chlordiazePOXIDE (LIBRIUM) capsule 25 mg  25 mg Oral Q6H PRN Money, Lowry Ram, FNP      . chlordiazePOXIDE (LIBRIUM) capsule 25 mg  25 mg Oral QID Money, Lowry Ram, FNP   25 mg at 10/05/18 0973   Followed by  . chlordiazePOXIDE (LIBRIUM) capsule 25 mg  25 mg Oral TID Money, Lowry Ram, FNP       Followed by  . [START ON 10/06/2018] chlordiazePOXIDE (LIBRIUM) capsule 25 mg  25 mg Oral BH-qamhs Money, Lowry Ram, FNP       Followed by  . [START ON 10/08/2018] chlordiazePOXIDE (LIBRIUM) capsule 25 mg  25 mg Oral Daily Money, Lowry Ram, FNP      . hydrOXYzine (ATARAX/VISTARIL) tablet 25 mg  25 mg Oral Q6H PRN Money, Lowry Ram, FNP      . hydrOXYzine (ATARAX/VISTARIL) tablet 50 mg  50 mg Oral Q6H PRN Laverle Hobby, PA-C      . levothyroxine (SYNTHROID, LEVOTHROID) tablet 100 mcg  100 mcg Oral QAC breakfast Laverle Hobby, PA-C   100 mcg at 10/05/18 0601  . loperamide (IMODIUM) capsule 2-4 mg  2-4 mg Oral PRN Money, Lowry Ram, FNP      . lubiprostone (AMITIZA) capsule 24 mcg  24 mcg Oral BID WC Patriciaann Clan E, PA-C   24 mcg at 10/05/18 5329  . multivitamin with minerals tablet 1 tablet  1 tablet Oral Daily Money, Lowry Ram, FNP   1 tablet at 10/05/18 9242  . OLANZapine-FLUoxetine (SYMBYAX) 6-25 MG per capsule 1 capsule  1 capsule Oral QHS Laverle Hobby, PA-C   1 capsule at 10/04/18 2208  . ondansetron  (ZOFRAN-ODT) disintegrating tablet 4 mg  4 mg Oral Q6H PRN Money, Lowry Ram, FNP      . pantoprazole (PROTONIX) EC tablet 40 mg  40 mg Oral Daily Laverle Hobby, PA-C   40 mg at 10/05/18 0809  . promethazine (PHENERGAN) tablet 25 mg  25 mg Oral Q6H PRN Sharma Covert, MD      . thiamine (B-1) injection 100 mg  100 mg Intramuscular Once Money, Darnelle Maffucci B, FNP      . thiamine (VITAMIN B-1) tablet 100 mg  100 mg Oral Daily Money, Lowry Ram, FNP   100 mg at 10/05/18 6834  . tiZANidine (ZANAFLEX) tablet 6 mg  6 mg Oral TID Patriciaann Clan E, PA-C   6 mg at 10/05/18 1962  . vitamin B-12 (CYANOCOBALAMIN) tablet 250 mcg  250 mcg Oral Daily Sharma Covert, MD   250 mcg at 10/05/18 2297  . zolpidem (AMBIEN) tablet 10 mg  10 mg Oral QHS PRN Laverle Hobby, PA-C   10 mg at 10/04/18 2145   PTA Medications: Facility-Administered Medications Prior to Admission  Medication Dose Route Frequency Provider Last Rate Last Dose  . 0.9 %  sodium chloride infusion  500 mL Intravenous Once Milus Banister, MD       Medications Prior to  Admission  Medication Sig Dispense Refill Last Dose  . ALPRAZolam (XANAX) 1 MG tablet Take 1 mg by mouth as needed.  0 Taking  . Calcium Carbonate-Vit D-Min (CALCIUM 1200 PO) Take by mouth 2 (two) times daily.    Taking  . Carbonyl Iron 15 MG CHEW Chew by mouth daily.    Taking  . divalproex (DEPAKOTE) 500 MG DR tablet   11   . levothyroxine (SYNTHROID, LEVOTHROID) 100 MCG tablet TAKE ONE (1) TABLET EACH DAY 90 tablet 5   . lubiprostone (AMITIZA) 24 MCG capsule TAKE 1 CAPSULE TWICE DAILY WITH A MEAL 60 capsule 11   . Multiple Vitamin (MULTIVITAMIN WITH MINERALS) TABS tablet Take 1 tablet by mouth 2 (two) times daily. For Vitamin supplementation   Taking  . OLANZapine-FLUoxetine (SYMBYAX) 3-25 MG capsule   10   . OLANZapine-FLUoxetine (SYMBYAX) 6-25 MG capsule Take by mouth at bedtime.   Taking  . omeprazole (PRILOSEC) 40 MG capsule Take 1 capsule (40 mg total) by mouth 2 (two)  times daily. TAKE ONE (1) CAPSULE EACH DAY 60 capsule 11   . promethazine (PHENERGAN) 25 MG tablet TAKE 1 TABLET EVERY 6 HOURS AS NEEDED FOR NAUSEA AND VOMITING 60 tablet 1 Taking  . tizanidine (ZANAFLEX) 6 MG capsule TAKE ONE (1) CAPSULE THREE (3) TIMES EACH DAY 90 capsule 1 Taking  . vitamin B-12 (CYANOCOBALAMIN) 250 MCG tablet Take 250 mcg by mouth daily.   Taking  . zolpidem (AMBIEN) 10 MG tablet Take 1 tablet (10 mg total) by mouth at bedtime as needed for sleep. 30 tablet 5     Patient Stressors: Marital or family conflict  Patient Strengths: Average or above average intelligence Capable of independent living Supportive family/friends Work skills  Treatment Modalities: Medication Management, Group therapy, Case management,  1 to 1 session with clinician, Psychoeducation, Recreational therapy.   Physician Treatment Plan for Primary Diagnosis: MDD (major depressive disorder), recurrent episode, severe ([place]) Long Term Goal(s): Improvement in symptoms so as ready for discharge Improvement in symptoms so as ready for discharge   Short Term Goals: Ability to identify changes in lifestyle to reduce recurrence of condition will improve Ability to verbalize feelings will improve Ability to disclose and discuss suicidal ideas Ability to demonstrate self-control will improve Ability to identify and develop effective coping behaviors will improve Ability to maintain clinical measurements within normal limits will improve Compliance with prescribed medications will improve  Medication Management: Evaluate patient's response, side effects, and tolerance of medication regimen.  Therapeutic Interventions: 1 to 1 sessions, Unit Group sessions and Medication administration.  Evaluation of Outcomes: Progressing  Physician Treatment Plan for Secondary Diagnosis: Principal Problem:   MDD (major depressive disorder), recurrent episode, severe ([place])  Long Term Goal(s): Improvement in symptoms  so as ready for discharge Improvement in symptoms so as ready for discharge   Short Term Goals: Ability to identify changes in lifestyle to reduce recurrence of condition will improve Ability to verbalize feelings will improve Ability to disclose and discuss suicidal ideas Ability to demonstrate self-control will improve Ability to identify and develop effective coping behaviors will improve Ability to maintain clinical measurements within normal limits will improve Compliance with prescribed medications will improve     Medication Management: Evaluate patient's response, side effects, and tolerance of medication regimen.  Therapeutic Interventions: 1 to 1 sessions, Unit Group sessions and Medication administration.  Evaluation of Outcomes: Progressing   RN Treatment Plan for Primary Diagnosis: MDD (major depressive disorder), recurrent episode, severe ([place]) Long  Term Goal(s): Knowledge of disease and therapeutic regimen to maintain health will improve  Short Term Goals: Ability to remain free from injury will improve, Ability to verbalize frustration and anger appropriately will improve, Ability to verbalize feelings will improve and Ability to disclose and discuss suicidal ideas  Medication Management: RN will administer medications as ordered by provider, will assess and evaluate patient's response and provide education to patient for prescribed medication. RN will report any adverse and/or side effects to prescribing provider.  Therapeutic Interventions: 1 on 1 counseling sessions, Psychoeducation, Medication administration, Evaluate responses to treatment, Monitor vital signs and CBGs as ordered, Perform/monitor CIWA, COWS, AIMS and Fall Risk screenings as ordered, Perform wound care treatments as ordered.  Evaluation of Outcomes: Progressing   LCSW Treatment Plan for Primary Diagnosis: MDD (major depressive disorder), recurrent episode, severe ([place]) Long Term Goal(s): Safe  transition to appropriate next level of care at discharge, Engage patient in therapeutic group addressing interpersonal concerns.  Short Term Goals: Engage patient in aftercare planning with referrals and resources, Increase ability to appropriately verbalize feelings, Increase emotional regulation and Increase skills for wellness and recovery  Therapeutic Interventions: Assess for all discharge needs, 1 to 1 time with Social worker, Explore available resources and support systems, Assess for adequacy in community support network, Educate family and significant other(s) on suicide prevention, Complete Psychosocial Assessment, Interpersonal group therapy.  Evaluation of Outcomes: Progressing  Progress in Treatment: Attending groups: Yes. (pt is programming on [address] hall)  Participating in groups: Yes. Taking medication as prescribed: Yes. Toleration medication: Yes. Family/Significant other contact made: Yes, individual(s) contacted:  pt's wife for collateral information and to complete SPE.  Patient understands diagnosis: Yes. Discussing patient identified problems/goals with staff: Yes. Medical problems stabilized or resolved: Yes. Denies suicidal/homicidal ideation: Yes. Issues/concerns per patient self-inventory: No. Other: n/a   New problem(s) identified: No, Describe:  n/a  New Short Term/Long Term Goal(s):  medication management for mood stabilization; elimination of SI thoughts; development of comprehensive mental wellness/sobriety plan.   Patient Goals:  "I don't have a goal right now."   Discharge Plan or Barriers: Pt has follow-up medication management appt with Dr. Toy Care on 10/28. CSW is exploring outpatient therapy options with pt. [facility] pamphlet, Mobile Crisis information, and AA/NA information provided to patient for additional community support and resources.   Reason for Continuation of Hospitalization: Anxiety Depression Medication stabilization  Estimated Length of  Stay: Tuesday, 10/06/18  Attendees: Patient: 10/05/2018 11:26 AM  Physician: Dr. Parke Poisson MD; Dr. Nancy Fetter MD 10/05/2018 11:26 AM  Nursing: Yetta Flock RN; Roderic Palau RN 10/05/2018 11:26 AM  RN Care Manager:x 10/05/2018 11:26 AM  Social Worker: Janice Norrie LCSW 10/05/2018 11:26 AM  Recreational Therapist: x 10/05/2018 11:26 AM  Other: Lindell Spar NP 10/05/2018 11:26 AM  Other:  10/05/2018 11:26 AM  Other: 10/05/2018 11:26 AM    Scribe for Treatment Team: Avelina Laine, LCSW 10/05/2018 11:26 AM

## 2018-10-05 NOTE — Progress Notes (Signed)
D.  Pt pleasant on approach, forwards little.  Pt did not attend evening wrap up group, but did sit up in dayroom for awhile before receiving night medications and going to bed.  Pt denies SI/HI/AVH at this time.  A.  Support and encouragement offered, medications given as ordered  R.  Pt remains safe on the unit, will continue to monitor.

## 2018-10-06 LAB — LIPID PANEL
Cholesterol: 157 mg/dL (ref 0–200)
HDL: 37 mg/dL — ABNORMAL LOW (ref >40)
LDL Cholesterol: 96 mg/dL (ref 0–99)
Total CHOL/HDL Ratio: 4.2 RATIO
Triglycerides: 120 mg/dL (ref <150)
VLDL: 24 mg/dL (ref 0–40)

## 2018-10-06 MED ORDER — OLANZAPINE-FLUOXETINE HCL 6-25 MG PO CAPS: 1.0000 | ORAL_CAPSULE | Freq: Every day | ORAL | 0 refills | Status: DC

## 2018-10-06 MED ORDER — HYDROXYZINE HCL 50 MG PO TABS: 50.0000 mg | ORAL_TABLET | Freq: Four times a day (QID) | ORAL | 0 refills | Status: DC | PRN

## 2018-10-06 NOTE — Discharge Summary (Signed)
Physician Discharge Summary Note  Patient:  Marvin Espinoza is an 47 y.o., male MRN:  [id number] DOB:  1971-08-16 Patient phone:  [phone] (home)  Patient address:   [address] [place] [address],  Total Time spent with patient: 20 minutes  Date of Admission:  10/04/2018 Date of Discharge: 10/06/18  Reason for Admission:  Worsening depression with SI and overdose  Principal Problem: MDD (major depressive disorder), recurrent episode, severe [facility] Discharge Diagnoses: Patient Active Problem List   Diagnosis Date Noted  . MDD (major depressive disorder), recurrent episode, severe ([place]) [F33.2] 10/04/2018  . OSA (obstructive sleep apnea) [G47.33] 09/07/2018  . Bilateral hip pain [M25.551, M25.552] 10/24/2017  . Shoulder pain, bilateral [M25.511, M25.512] 10/24/2017  . Arthritis [M19.90] 10/24/2017  . Osteoarthritis of both hips [M16.0] 10/24/2017  . Avascular necrosis of bone of hip, right ([place]) [M87.051] 10/24/2017  . 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 PTSD (post-traumatic stress disorder) [F43.10] 08/20/2017  . Tardive dyskinesia [G24.01] 08/20/2017  . Severe major depression ([place]) [F32.2] 05/10/2017  . Affective psychosis, bipolar ([place]) [F31.9] 05/10/2017  . Vision disturbance [H53.9] 05/06/2017  . History of concussion [Z87.820] 02/10/2017  . Mood disorder ([place]) [F39] 02/10/2017  . 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 Adjustment disorder with anxious mood [F43.22] 01/24/2017  . GAD (generalized anxiety disorder) [F41.1] 01/24/2017  . Gastroesophageal reflux disease without esophagitis [K21.9] 11/19/2016  . Hypothyroidism [E03.9] 11/19/2016  . DDD (degenerative disc disease), lumbar [M51.36] 11/19/2016    Past Psychiatric History: MDD, mood disorder, previous overdose attempts, previous hospitalizations  Past Medical History:  Past Medical History:  Diagnosis Date  . Anxiety   . Bipolar disorder ([place])   . CTS (carpal tunnel syndrome)   .  Depression   . GERD (gastroesophageal reflux disease)   . H/O bariatric surgery   . Head injury 05/06/2017  . Hypothyroidism   . Hypothyroidism   . Sleep apnea    no need for CPAP at this time  . Sleep apnea     Past Surgical History:  Procedure Laterality Date  . ANAL FISSURE REPAIR    . BARIATRIC SURGERY    . CARPAL TUNNEL RELEASE Left 05/09/2015   Procedure: LEFT CARPAL TUNNEL RELEASE;  Surgeon: Daryll Brod, MD;  Location: [place];  Service: Orthopedics;  Laterality: Left;  . CARPAL TUNNEL RELEASE Left 05-09-2015  . CARPAL TUNNEL RELEASE Right 06/20/2015   Procedure: RIGHT CARPAL TUNNEL RELEASE;  Surgeon: Daryll Brod, MD;  Location: [place];  Service: Orthopedics;  Laterality: Right;  REGIONAL/FAB  . CHOLECYSTECTOMY    . COLONOSCOPY    . UPPER GASTROINTESTINAL ENDOSCOPY     Family History:  Family History  Problem Relation Age of Onset  . Diabetes Mother   . Heart disease Mother   . Depression Mother   . Esophageal cancer Mother   . COPD Father   . Heart disease Father   . Kidney disease Father   . Mental illness Father   . Depression Father   . Mental illness Sister   . Depression Sister   . Esophageal cancer Maternal Uncle   . Colon cancer Neg Hx   . Rectal cancer Neg Hx   . Stomach cancer Neg Hx    Family Psychiatric  History: See above Social History:  Social History   Substance and Sexual Activity  Alcohol Use No     Social History   Substance and Sexual Activity  Drug Use No    Social  History   Socioeconomic History  . Marital status: Married    Spouse name: Not on file  . Number of children: 1  . Years of education: Not on file  . Highest education level: Not on file  Occupational History  . Not on file  Social Needs  . Financial resource strain: Not hard at all  . Food insecurity:    Worry: Never true    Inability: Never true  . Transportation needs:    Medical: No    Non-medical: No  Tobacco Use  .  Smoking status: Never Smoker  . Smokeless tobacco: Never Used  Substance and Sexual Activity  . Alcohol use: No  . Drug use: No  . Sexual activity: Yes  Lifestyle  . Physical activity:    Days per week: Not on file    Minutes per session: Not on file  . Stress: Not on file  Relationships  . Social connections:    Talks on phone: Not on file    Gets together: Not on file    Attends religious service: Not on file    Active member of club or organization: Not on file    Attends meetings of clubs or organizations: Not on file    Relationship status: Not on file  Other Topics Concern  . Not on file  Social History Narrative   ** Merged History Encounter [facility] Course:   10/03/18 [facility] Counselor Assessment: 47 y.o.malewho presents to the ED voluntarily.Pt initially presented to [facility] as a walk-in due to an intentional OD of 15 xanax since 10am this morning, however for medical reasons pt was transported to [facility]. [name] met with the pt who remained drowsy throughout the assessment. Pt states he began to feel depressed after a conflict with his mother-in-law. It is unclear what transpired causing the pt and his mother-in-law to have a disagreement, however he stated "she changed her will." Pt also stated "the way she is." Pt is falling asleep throughout the assessment and has to be stimulated multiple times in order to arouse. Pt states he has attempted suicide in the past as an adolescent. Pt states he is followed by Dr. Toy Care, MD for [facility] [place] needs.  10/04/18 [facility] MD SRA: 47 year old male with a reported past psychiatric history significant for bipolar disorder type II versus major depression who presented to the [facility] emergency department on 10/19 after an intentional overdose of Xanax. The patient had reportedly taken 15 Xanax tablets earlier in the a.m. He was transported to the [facility] emergency department for evaluation. The patient  stated that he had gotten into a conflict with his sister and his mother-in-law. He did become acutely depressed. It had something to do with the mother-in-law's well. The patient admitted to previous overdoses of medications. He had been last hospitalized at [facility] on 08/09/2017. He also had been admitted to our facility on 05/10/2017 where he had overdosed on Xanax. He is followed as an outpatient by Dr. Toy Care.He admitted to acute depression, emotional stress, inability to cope, suicidal ideation as well as homicidal ideation. He was admitted to the hospital for evaluation and stabilization.  10/04/18 [place] MD Assessment: Patient is seen by me face-to-face today.  Patient acknowledges the above information.  Patient also reports that he was taking 1 mg of Xanax 4 times a day before he overdosed.  Patient states he has no other questions concerns or complaints at this  time.  Patient remained on the [facility] unit for 2 days. The patient stabilized on medication and therapy. Patient was discharged on Symbax 6-25 mg Daily, Vistaril 50 mg Q6H PRN. Patient has shown improvement with improved mood, affect, sleep, appetite, and interaction. Patient has attended group and participated. Patient has been seen in the day room interacting with peers and staff appropriately. Patient denies any SI/HI/AVH and contracts for safety. Patient agrees to follow up at Dr. Starleen Arms office. Patient is provided with prescriptions for their medications upon discharge.  Physical Findings: AIMS: Facial and Oral Movements Muscles of Facial Expression: None, normal Lips and Perioral Area: None, normal Jaw: None, normal Tongue: None, normal,Extremity Movements Upper (arms, wrists, hands, fingers): None, normal Lower (legs, knees, ankles, toes): None, normal, Trunk Movements Neck, shoulders, hips: None, normal, Overall Severity Severity of abnormal movements (highest score from questions above): None, normal Incapacitation due to  abnormal movements: None, normal Patient's awareness of abnormal movements (rate only patient's report): No Awareness, Dental Status Current problems with teeth and/or dentures?: No Does patient usually wear dentures?: No  CIWA:  CIWA-Ar Total: 1 COWS:     Musculoskeletal: Strength & Muscle Tone: within normal limits Gait & Station: normal Patient leans: N/A  Psychiatric Specialty Exam: Physical Exam  Nursing note and vitals reviewed. Constitutional: He is oriented to person, place, and time. He appears well-developed and well-nourished.  Cardiovascular: Normal rate.  Respiratory: Effort normal.  Musculoskeletal: Normal range of motion.  Neurological: He is alert and oriented to person, place, and time.  Skin: Skin is warm.    Review of Systems  Constitutional: Negative.   HENT: Negative.   Eyes: Negative.   Respiratory: Negative.   Cardiovascular: Negative.   Gastrointestinal: Negative.   Genitourinary: Negative.   Musculoskeletal: Negative.   Skin: Negative.   Neurological: Negative.   Endo/Heme/Allergies: Negative.   Psychiatric/Behavioral: Negative.     Blood pressure 130/89, pulse 66, temperature 97.9 F (36.6 C), temperature source Oral, resp. rate 16, height '5\' 10"'  (1.778 m), weight 109.3 kg.Body mass index is 34.58 kg/m.  General Appearance: Casual  Eye Contact:  Good  Speech:  Clear and Coherent and Normal Rate  Volume:  Normal  Mood:  Euthymic  Affect:  Congruent  Thought Process:  Goal Directed and Descriptions of Associations: Intact  Orientation:  Full (Time, Place, and Person)  Thought Content:  WDL  Suicidal Thoughts:  No  Homicidal Thoughts:  No  Memory:  Immediate;   Good Recent;   Good Remote;   Good  Judgement:  Good  Insight:  Fair  Psychomotor Activity:  Normal  Concentration:  Concentration: Good and Attention Span: Good  Recall:  Good  Fund of Knowledge:  Good  Language:  Good  Akathisia:  No  Handed:  Right  AIMS (if indicated):      Assets:  Communication Skills Desire for Improvement Financial Resources/Insurance Housing Physical Health Social Support Transportation  ADL's:  Intact  Cognition:  WNL  Sleep:  Number of Hours: 6     Have you used any form of tobacco in the last 30 days? (Cigarettes, Smokeless Tobacco, Cigars, and/or Pipes): No  Has this patient used any form of tobacco in the last 30 days? (Cigarettes, Smokeless Tobacco, Cigars, and/or Pipes) Yes, No  Blood Alcohol level:  Lab Results  Component Value Date   ETH <10 10/03/2018   ETH <5 88/89/1694    Metabolic Disorder Labs:  No results found for: HGBA1C, MPG No results found for: PROLACTIN  Lab Results  Component Value Date   CHOL 157 10/06/2018   TRIG 120 10/06/2018   HDL 37 (L) 10/06/2018   CHOLHDL 4.2 10/06/2018   VLDL 24 10/06/2018   LDLCALC 96 10/06/2018   LDLCALC 56 04/25/2017    See Psychiatric Specialty Exam and Suicide Risk Assessment completed by Attending Physician prior to discharge.  Discharge destination:  Home  Is patient on multiple antipsychotic therapies at discharge:  No   Has Patient had three or more failed trials of antipsychotic monotherapy by history:  No  Recommended Plan for Multiple Antipsychotic Therapies: NA   Allergies as of 10/06/2018      Reactions   Aripiprazole Anaphylaxis, Swelling   "slurred speech"   Cariprazine Hcl Other (See Comments)   Latuda  [lurasidone Hcl] Other (See Comments)   Seroquel [quetiapine Fumarate]    tardive dyskinesia   Alcohol Itching   Drinking alcohol   Cariprazine Other (See Comments)   Tardive dyskenesia   Hydrocodone-acetaminophen Itching   Lurasidone Other (See Comments)   dysconesia   Victoza [liraglutide] Other (See Comments)   Severe heartburn   Victoza [liraglutide]    Severe heartburn   Bupropion Rash   Risperidone Rash      Medication List    STOP taking these medications   ALPRAZolam 1 MG tablet Commonly known as:  XANAX   divalproex  500 MG DR tablet Commonly known as:  DEPAKOTE   tizanidine 6 MG capsule Commonly known as:  ZANAFLEX   zolpidem 10 MG tablet Commonly known as:  AMBIEN     TAKE these medications     Indication  CALCIUM 1200 PO Take by mouth 2 (two) times daily.  Indication:  Per PCP   Carbonyl Iron 15 MG Chew Chew by mouth daily.  Indication:  Per PCP   hydrOXYzine 50 MG tablet Commonly known as:  ATARAX/VISTARIL Take 1 tablet (50 mg total) by mouth every 6 (six) hours as needed for anxiety.  Indication:  Feeling Anxious   levothyroxine 100 MCG tablet Commonly known as:  SYNTHROID, LEVOTHROID TAKE ONE (1) TABLET EACH DAY  Indication:  Underactive Thyroid   lubiprostone 24 MCG capsule Commonly known as:  AMITIZA TAKE 1 CAPSULE TWICE DAILY WITH A MEAL  Indication:  Per PCP   multivitamin with minerals Tabs tablet Take 1 tablet by mouth 2 (two) times daily. For Vitamin supplementation  Indication:  Vitamin supplement   OLANZapine-FLUoxetine 6-25 MG capsule Commonly known as:  SYMBYAX Take 1 capsule by mouth at bedtime. For mood control What changed:    how much to take  additional instructions  Another medication with the same name was removed. Continue taking this medication, and follow the directions you see here.  Indication:  mood stability   omeprazole 40 MG capsule Commonly known as:  PRILOSEC Take 1 capsule (40 mg total) by mouth 2 (two) times daily. TAKE ONE (1) CAPSULE EACH DAY  Indication:  Per PCP   promethazine 25 MG tablet Commonly known as:  PHENERGAN TAKE 1 TABLET EVERY 6 HOURS AS NEEDED FOR NAUSEA AND VOMITING  Indication:  Per PCP   vitamin B-12 250 MCG tablet Commonly known as:  CYANOCOBALAMIN Take 250 mcg by mouth daily.  Indication:  Per PCP      Follow-up Information    Chucky May, MD Follow up on 10/12/2018.   Specialty:  Psychiatry Why:  Hospital follow-up/medication management on Monday, 10/28 at 8:45AM with Dr. Toy Care. Please bring  hospital discharge paperwork to this  appt. Thank you.  Contact information: [place] [place] [address] [phone]           Follow-up recommendations:  Continue activity as tolerated. Continue diet as recommended by your PCP. Ensure to keep all appointments with outpatient providers.  Comments:  Patient is instructed prior to discharge to: Take all medications as prescribed by his/her mental healthcare provider. Report any adverse effects and or reactions from the medicines to his/her outpatient provider promptly. Patient has been instructed & cautioned: To not engage in alcohol and or illegal drug use while on prescription medicines. In the event of worsening symptoms, patient is instructed to call the crisis hotline, 911 and or go to the nearest ED for appropriate evaluation and treatment of symptoms. To follow-up with his/her primary care provider for your other medical issues, concerns and or health care needs.    Signed: [place], FNP 10/06/2018, 8:58 AM

## 2018-10-06 NOTE — Progress Notes (Signed)
Patient ID: Marvin Espinoza, male   DOB: November 01, 1971, 47 y.o.   MRN: [id number]  Nursing Progress Note [id number]  Data: Patient presents with pleasant mood and is seen up in the dayroom interacting with peers. Patient compliant with scheduled medications. Patient denies pain/physical complaints. Patient completed self-inventory sheet and rates depression, hopelessness, and anxiety 1,0,1 respectively. Patient rates their sleep and appetite as good/good respectively. Patient states goal for today is to "work on discharge plan". Patient is seen attending groups. Patient currently denies SI/HI/AVH. Patient does not appear to be having withdrawal symptoms at this time.  Action: Patient is educated about and provided medication per provider's orders. Patient safety maintained with q15 min safety checks and frequent rounding. High fall risk precautions in place. Emotional support given. 1:1 interaction and active listening provided. Patient encouraged to attend meals, groups, and work on treatment plan and goals. Labs, vital signs and patient behavior monitored throughout shift.   Response: Patient remains safe on the unit at this time and agrees to come to staff with any issues/concerns. Patient is interacting with peers appropriately on the unit. Will continue to support and monitor.

## 2018-10-06 NOTE — Progress Notes (Signed)
  [facility] Adult Case Management Discharge Plan :  Will you be returning to the same living situation after discharge:  Yes,  home At discharge, do you have transportation home?: Yes,  wife Do you have the ability to pay for your medications: Yes,  BCBS insurance  Release of information consent forms completed and in the chart;  Patient's signature needed at discharge.  Patient to Follow up at: Follow-up Information    Chucky May, MD Follow up on 10/12/2018.   Specialty:  Psychiatry Why:  Hospital follow-up/medication management on Monday, 10/28 at 8:45AM with Dr. Toy Care. Please bring hospital discharge paperwork to this appt. Thank you.  Contact information: [place] [address] [place] [address] [phone]         Pt offered counseling referral but stated that until he can get his license back, he does not have transportation. Pt agreeable to getting information about [facility] for 1:1 therapy and marriage counseling ([place] office).   Next level of care provider has access to [place] and Suicide Prevention discussed: Yes,  SPE completed with pt and his wife. SPI pamphlet and Mobile Crisis information provided.  Have you used any form of tobacco in the last 30 days? (Cigarettes, Smokeless Tobacco, Cigars, and/or Pipes): No  Has patient been referred to the Quitline?: Patient refused referral  Patient has been referred for addiction treatment: [place], LCSW 10/06/2018, 9:16 AM

## 2018-10-06 NOTE — Progress Notes (Signed)
Patient ID: Marvin Espinoza, male   DOB: 03/22/1971, 47 y.o.   MRN: [id number]  Discharge Note  D) Patient discharged to lobby. Patient states readiness for discharge. Patient denies SI/HI, AVH and is not delusional or psychotic. Patient in no acute distress. Patient has completed their Suicide Safety Plan. Patient provided an opportunity to complete and return Patient Satisfaction Survey.   A) Written and verbal discharge instructions given to the patient. Patient accepting to information and verbalized understanding. Patient agrees to the discharge plan. Opportunity for questions and concerns presented to patient. Patient denied any further questions or concerns. All belongings returned to patient. Patient signed for return of belongings and discharge paperwork. Patient provided a copy of their Suicide Safety Plan and has been provided a copy of the Patient Satisfaction Survey with return instructions.  R) Patient safely escorted to the lobby. Patient discharged from [facility] with prescriptions, personal belongings, follow-up appointment in place and discharge paperwork.

## 2018-10-06 NOTE — [facility] Suicide Risk Assessment (Signed)
[facility] Discharge Suicide Risk Assessment   Principal Problem: MDD (major depressive disorder), recurrent episode, severe ([place]) Discharge Diagnoses:  Patient Active Problem List   Diagnosis Date Noted  . MDD (major depressive disorder), recurrent episode, severe ([place]) [F33.2] 10/04/2018  . OSA (obstructive sleep apnea) [G47.33] 09/07/2018  . Bilateral hip pain [M25.551, M25.552] 10/24/2017  . Shoulder pain, bilateral [M25.511, M25.512] 10/24/2017  . Arthritis [M19.90] 10/24/2017  . Osteoarthritis of both hips [M16.0] 10/24/2017  . Avascular necrosis of bone of hip, right ([place]) [M87.051] 10/24/2017  . 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 PTSD (post-traumatic stress disorder) [F43.10] 08/20/2017  . Tardive dyskinesia [G24.01] 08/20/2017  . Severe major depression ([place]) [F32.2] 05/10/2017  . Affective psychosis, bipolar ([place]) [F31.9] 05/10/2017  . Vision disturbance [H53.9] 05/06/2017  . History of concussion [Z87.820] 02/10/2017  . Mood disorder ([place]) [F39] 02/10/2017  . 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 Adjustment disorder with anxious mood [F43.22] 01/24/2017  . GAD (generalized anxiety disorder) [F41.1] 01/24/2017  . Gastroesophageal reflux disease without esophagitis [K21.9] 11/19/2016  . Hypothyroidism [E03.9] 11/19/2016  . DDD (degenerative disc disease), lumbar [M51.36] 11/19/2016    Total Time spent with patient: 15 minutes  Musculoskeletal: Strength & Muscle Tone: within normal limits Gait & Station: normal Patient leans: N/A  Psychiatric Specialty Exam: Review of Systems  All other systems reviewed and are negative.   Blood pressure 130/89, pulse 66, temperature 97.9 F (36.6 C), temperature source Oral, resp. rate 16, height 5\' 10"  (1.778 m), weight 109.3 kg.Body mass index is 34.58 kg/m.  General Appearance: Casual  Eye Contact::  Good  Speech:  Normal Rate409  Volume:  Normal  Mood:  Euthymic  Affect:  Congruent  Thought Process:   Coherent and Descriptions of Associations: Intact  Orientation:  Full (Time, Place, and Person)  Thought Content:  Logical  Suicidal Thoughts:  No  Homicidal Thoughts:  No  Memory:  Immediate;   Fair Recent;   Fair Remote;   Fair  Judgement:  Intact  Insight:  Fair  Psychomotor Activity:  Normal  Concentration:  Good  Recall:  Good  Fund of Knowledge:Good  Language: Good  Akathisia:  Negative  Handed:  Right  AIMS (if indicated):     Assets:  Communication Skills Desire for Improvement Housing Leisure Time Physical Health Resilience Social Support  Sleep:  Number of Hours: 6  Cognition: WNL  ADL's:  Intact   Mental Status Per Nursing Assessment::   On Admission:  Suicidal ideation indicated by patient, Thoughts of violence towards others  Demographic Factors:  Male, Caucasian and Low socioeconomic status  Loss Factors: NA  Historical Factors: Prior suicide attempts and Impulsivity  Risk Reduction Factors:   Sense of responsibility to family, Living with another person, especially a relative, Positive social support and Positive therapeutic relationship  Continued Clinical Symptoms:  Depression:   Impulsivity  Cognitive Features That Contribute To Risk:  None    Suicide Risk:  Minimal: No identifiable suicidal ideation.  Patients presenting with no risk factors but with morbid ruminations; may be classified as minimal risk based on the severity of the depressive symptoms  Follow-up Information    Chucky May, MD Follow up on 10/12/2018.   Specialty:  Psychiatry Why:  Hospital follow-up/medication management on Monday, 10/28 at 8:45AM with Dr. Toy Care. Please bring hospital discharge paperwork to this appt. Thank you.  Contact information: [address] [place] [address] [phone]  Plan Of Care/Follow-up recommendations:  Activity:  ad lib  Sharma Covert, MD 10/06/2018, 8:43 AM

## 2018-10-07 LAB — HEMOGLOBIN A1C
Hgb A1c MFr Bld: 6.7 % — ABNORMAL HIGH (ref 4.8–5.6)
Mean Plasma Glucose: 146 mg/dL

## 2018-10-12 DIAGNOSIS — F3131 Bipolar disorder, current episode depressed, mild: Secondary | ICD-10-CM | POA: Diagnosis not present

## 2018-10-12 DIAGNOSIS — F3111 Bipolar disorder, current episode manic without psychotic features, mild: Secondary | ICD-10-CM | POA: Diagnosis not present

## 2018-10-14 DIAGNOSIS — M5136 Other intervertebral disc degeneration, lumbar region: Secondary | ICD-10-CM | POA: Diagnosis not present

## 2018-10-14 DIAGNOSIS — G894 Chronic pain syndrome: Secondary | ICD-10-CM | POA: Diagnosis not present

## 2018-10-14 DIAGNOSIS — M129 Arthropathy, unspecified: Secondary | ICD-10-CM | POA: Diagnosis not present

## 2018-10-14 DIAGNOSIS — Z79899 Other long term (current) drug therapy: Secondary | ICD-10-CM | POA: Diagnosis not present

## 2018-10-17 ENCOUNTER — Other Ambulatory Visit: Payer: Self-pay | Admitting: Physician Assistant

## 2018-10-17 DIAGNOSIS — M5136 Other intervertebral disc degeneration, lumbar region: Secondary | ICD-10-CM

## 2018-10-28 DIAGNOSIS — M5136 Other intervertebral disc degeneration, lumbar region: Secondary | ICD-10-CM | POA: Diagnosis not present

## 2018-10-28 DIAGNOSIS — G894 Chronic pain syndrome: Secondary | ICD-10-CM | POA: Diagnosis not present

## 2018-10-28 DIAGNOSIS — Z79899 Other long term (current) drug therapy: Secondary | ICD-10-CM | POA: Diagnosis not present

## 2018-11-02 DIAGNOSIS — F3111 Bipolar disorder, current episode manic without psychotic features, mild: Secondary | ICD-10-CM | POA: Diagnosis not present

## 2018-11-02 DIAGNOSIS — F3131 Bipolar disorder, current episode depressed, mild: Secondary | ICD-10-CM | POA: Diagnosis not present

## 2018-11-02 DIAGNOSIS — F410 Panic disorder [episodic paroxysmal anxiety] without agoraphobia: Secondary | ICD-10-CM | POA: Diagnosis not present

## 2018-11-03 ENCOUNTER — Encounter: Payer: Self-pay | Admitting: Neurology

## 2018-11-03 ENCOUNTER — Ambulatory Visit (INDEPENDENT_AMBULATORY_CARE_PROVIDER_SITE_OTHER): Payer: BLUE CROSS/BLUE SHIELD | Admitting: Neurology

## 2018-11-03 VITALS — BP 133/88 | HR 59 | Ht 70.0 in | Wt 231.0 lb

## 2018-11-03 DIAGNOSIS — G4719 Other hypersomnia: Secondary | ICD-10-CM

## 2018-11-03 DIAGNOSIS — G4733 Obstructive sleep apnea (adult) (pediatric): Secondary | ICD-10-CM

## 2018-11-03 DIAGNOSIS — Z820 Family history of epilepsy and other diseases of the nervous system: Secondary | ICD-10-CM | POA: Diagnosis not present

## 2018-11-03 DIAGNOSIS — E669 Obesity, unspecified: Secondary | ICD-10-CM | POA: Diagnosis not present

## 2018-11-03 DIAGNOSIS — R0683 Snoring: Secondary | ICD-10-CM

## 2018-11-03 DIAGNOSIS — R351 Nocturia: Secondary | ICD-10-CM

## 2018-11-03 NOTE — Patient Instructions (Signed)

## 2018-11-03 NOTE — Progress Notes (Signed)
SubjDective:    Patient ID: Marvin Espinoza is a 47 y.o. male.  HPI     Star Age, MD, PhD [facility] [address].O. [place], [place] [address]  Dear Marvin Espinoza,   I saw your patient, Marvin Espinoza, upon your kind request, in my clinic today for initial consultation of his sleep disorder, in particular, evaluation for sleep apnea. The patient is accompanied by his wife today. As you know, [name] is a 47 year old right-handed gentleman with an underlying medical history of mood disorder, including diagnosis of bipolar disorder, recent behavioral admission last month for overdose on Xanax, bariatric surgery status, chronic low back pain not currently on narcotic pain medication, reflux disease, hypothyroidism, dizziness and history of blackout spells for which he has seen Dr. Tomi Likens at [facility] neurology, and obesity, who was previously diagnosed with obstructive sleep apnea and placed on CPAP therapy. Prior sleep study results are not available for my review today. He no longer uses CPAP. A compliance download from January 2017 through January 2018 showed non-usage. I reviewed your office note from 09/04/2018. He reports significant weight loss after his bariatric surgery. He's wondering if he still needs to be treated for sleep apnea. His Epworth sleepiness score is 9 out of 24 today, fatigue score is 43 out of 63. He is married and lives with his wife and 30 yo son (from previous marriage). He is self-employed, Curator, current off of work d/t surgeries. He is a nonsmoker and does not utilize alcoholic drinks caffeine about 3 servings per day on average. He had gastric bypass surgery at [facility] on 11/30/2015. His total weight loss has been over 140 pounds. Sleep study testing was about 5 years ago he believes. He had difficulty using his CPAP after his bariatric surgery. He would be willing to get retested and consider CPAP therapy again. His father had  sleep apnea, passed away at age 71. Mother has a history of throat cancer. Patient reports recent surgeries including bilateral shoulder surgeries and he has had back pain, he follows with pain management. He is being considered for narcotic pain medication but as I understand, there was concern because of his concomitant use of benzodiazepine. His wife manages his Xanax currently, lays out 4 pills per day for him, he can take up to 4 pills per day. He sees his psychiatrist about once a month, Dr. Toy Care, may switch off of olanzapine because of elevated A1c recently. He has seen several different orthopedic doctors because of different issues including back pain, knee pain, and shoulder issues. His bedtime is between 10 and 11 and rise time between 5:30 and 6:30 AM. He has nocturia about once per night on average, denies morning headaches. He is also on valproic acid, 500 mg in the morning, 500 mg in the afternoon and 1500 mg at night. He had a tonsillectomy as a child.  His Past Medical History Is Significant For: Past Medical History:  Diagnosis Date  . Anxiety   . Bipolar disorder ([place])   . CTS (carpal tunnel syndrome)   . Depression   . GERD (gastroesophageal reflux disease)   . H/O bariatric surgery   . Head injury 05/06/2017  . Hypothyroidism   . Hypothyroidism   . Sleep apnea    no need for CPAP at this time  . Sleep apnea     His Past Surgical History Is Significant For: Past Surgical History:  Procedure Laterality Date  . ANAL FISSURE  REPAIR    . BARIATRIC SURGERY    . CARPAL TUNNEL RELEASE Left 05/09/2015   Procedure: LEFT CARPAL TUNNEL RELEASE;  Surgeon: Daryll Brod, MD;  Location: [facility];  Service: Orthopedics;  Laterality: Left;  . CARPAL TUNNEL RELEASE Left 05-09-2015  . CARPAL TUNNEL RELEASE Right 06/20/2015   Procedure: RIGHT CARPAL TUNNEL RELEASE;  Surgeon: Daryll Brod, MD;  Location: [place];  Service: Orthopedics;  Laterality: Right;   REGIONAL/FAB  . CHOLECYSTECTOMY    . COLONOSCOPY    . UPPER GASTROINTESTINAL ENDOSCOPY      His Family History Is Significant For: Family History  Problem Relation Age of Onset  . Diabetes Mother   . Heart disease Mother   . Depression Mother   . Esophageal cancer Mother   . COPD Father   . Heart disease Father   . Kidney disease Father   . Mental illness Father   . Depression Father   . Mental illness Sister   . Depression Sister   . Esophageal cancer Maternal Uncle   . Colon cancer Neg Hx   . Rectal cancer Neg Hx   . Stomach cancer Neg Hx     His Social History Is Significant For: Social History   Socioeconomic History  . Marital status: Married    Spouse name: Not on file  . Number of children: 1  . Years of education: Not on file  . Highest education level: Not on file  Occupational History  . Not on file  Social Needs  . Financial resource strain: Not hard at all  . Food insecurity:    Worry: Never true    Inability: Never true  . Transportation needs:    Medical: No    Non-medical: No  Tobacco Use  . Smoking status: Never Smoker  . Smokeless tobacco: Never Used  Substance and Sexual Activity  . Alcohol use: No  . Drug use: No  . Sexual activity: Yes  Lifestyle  . Physical activity:    Days per week: Not on file    Minutes per session: Not on file  . Stress: Not on file  Relationships  . Social connections:    Talks on phone: Not on file    Gets together: Not on file    Attends religious service: Not on file    Active member of club or organization: Not on file    Attends meetings of clubs or organizations: Not on file    Relationship status: Not on file  Other Topics Concern  . Not on file  Social History Narrative   ** Merged History Encounter **        His Allergies Are:  Allergies  Allergen Reactions  . Aripiprazole Anaphylaxis and Swelling    "slurred speech"   . Cariprazine Hcl Other (See Comments)  . Latuda  [Lurasidone Hcl]  Other (See Comments)  . Seroquel [Quetiapine Fumarate]     tardive dyskinesia  . Alcohol Itching    Drinking alcohol  . Cariprazine Other (See Comments)    Tardive dyskenesia  . Hydrocodone-Acetaminophen Itching  . Lurasidone Other (See Comments)    dysconesia  . Victoza [Liraglutide] Other (See Comments)    Severe heartburn   . Victoza [Liraglutide]     Severe heartburn  . Bupropion Rash  . Risperidone Rash  :   His Current Medications Are:  Outpatient Encounter Medications as of 11/03/2018  Medication Sig  . Calcium Carbonate-Vit D-Min (CALCIUM 1200 PO) Take  by mouth 2 (two) times daily.   Carin Hock Iron 15 MG CHEW Chew by mouth daily.   . hydrOXYzine (ATARAX/VISTARIL) 50 MG tablet Take 1 tablet (50 mg total) by mouth every 6 (six) hours as needed for anxiety.  Marland Kitchen levothyroxine (SYNTHROID, LEVOTHROID) 100 MCG tablet TAKE ONE (1) TABLET EACH DAY  . lubiprostone (AMITIZA) 24 MCG capsule TAKE 1 CAPSULE TWICE DAILY WITH A MEAL  . Multiple Vitamin (MULTIVITAMIN WITH MINERALS) TABS tablet Take 1 tablet by mouth 2 (two) times daily. For Vitamin supplementation  . OLANZapine-FLUoxetine (SYMBYAX) 6-25 MG capsule Take 1 capsule by mouth at bedtime. For mood control  . omeprazole (PRILOSEC) 40 MG capsule Take 1 capsule (40 mg total) by mouth 2 (two) times daily. TAKE ONE (1) CAPSULE EACH DAY  . promethazine (PHENERGAN) 25 MG tablet TAKE 1 TABLET EVERY 6 HOURS AS NEEDED FOR NAUSEA AND VOMITING  . tizanidine (ZANAFLEX) 6 MG capsule TAKE ONE (1) CAPSULE THREE (3) TIMES EACH DAY  . vitamin B-12 (CYANOCOBALAMIN) 250 MCG tablet Take 250 mcg by mouth daily.   No facility-administered encounter medications on file as of 11/03/2018.   :  Review of Systems:  Out of a complete 14 point review of systems, all are reviewed and negative with the exception of these symptoms as listed below: Review of Systems  Neurological:       Pt presents today to discuss his sleep. Pt has a diagnosis of sleep  apnea and has lost weight and wants to know if he still has osa. Pt has a cpap but has not been using it. Pt used [facility]. Pt's last sleep study was around 5 years.  Epworth Sleepiness Scale 0= would never doze 1= slight chance of dozing 2= moderate chance of dozing 3= high chance of dozing  Sitting and reading: 2 Watching TV: 3 Sitting inactive in a public place (ex. Theater or meeting): 0 As a passenger in a car for an hour without a break: 0 Lying down to rest in the afternoon: 2 Sitting and talking to someone: 0 Sitting quietly after lunch (no alcohol): 2 In a car, while stopped in traffic: 0 Total: 9     Objective:  Neurological Exam  Physical Exam Physical Examination:   Vitals:   11/03/18 1615  BP: 133/88  Pulse: (!) 59    General Examination: The patient is a very pleasant 47 y.o. male in no acute distress. He appears well-developed and well-nourished and well groomed.   HEENT: Normocephalic, atraumatic, pupils are equal, round and reactive to light and accommodation. Extraocular tracking is good without limitation to gaze excursion or nystagmus noted. Normal smooth pursuit is noted. Hearing is grossly intact. Face is symmetric with normal facial animation and normal facial sensation. Speech is clear with occasional speech hesitation noted. He has no hypophonia. He has no carotid bruits. Neck is supple, full range of motion noted. On airway examination, he has mild to moderate mouth dryness, adequate dental hygiene, mild airway crowding secondary to smaller airway entry but otherwise benign findings, status post tonsillectomy, Mallampati class I, neck circumference is 16-5/8 inches. Tongue protrudes centrally and palate elevates symmetrically.  Chest: Clear to auscultation without wheezing, rhonchi or crackles noted.  Heart: S1+S2+0, regular and normal without murmurs, rubs or gallops noted.   Abdomen: Soft, non-tender and non-distended with normal bowel sounds appreciated  on auscultation.  Extremities: There is no edema in the distal lower extremities.  Skin: Warm and dry without trophic changes noted.  Musculoskeletal: exam  reveals no obvious joint deformities, tenderness or joint swelling or erythema.   Neurologically:  Mental status: The patient is awake, alert and oriented in all 4 spheres. His immediate and remote memory, attention, language skills and fund of knowledge are appropriate. There is a little bit of slowness in thinking at times, some word finding difficulty at times. Mood is normal, affect is normal.   Cranial nerves II - XII are as described above under HEENT exam. In addition: shoulder shrug is normal with equal shoulder height noted. Motor exam: Normal bulk, strength and tone is noted. There is no tremor. Fine motor skills and coordination: grossly intact.  Cerebellar testing: No dysmetria or intention tremor. There is no truncal or gait ataxia.  Sensory exam: intact to light touch.  Gait, station and balance: He stands easily. No veering to one side is noted. No leaning to one side is noted. Posture is age-appropriate and stance is narrow based. Gait shows normal stride length and normal pace. No problems turning are noted.   Assessment and Plan:   In summary, Brian Zeitlin is a very pleasant 47 y.o.-year old male with an underlying medical history of mood disorder, including diagnosis of bipolar disorder, recent behavioral admission last month for overdose on Xanax, bariatric surgery status, chronic low back pain not currently on narcotic pain medication, reflux disease, hypothyroidism, dizziness and history of blackout spells for which he has seen Dr. Tomi Likens at [facility] neurology, and obesity, who presents for evaluation of his prior diagnosis of obstructive sleep apnea. He had sleep studies testing prior to his bariatric surgery and then lost a significant amount of weight since December 2016. Nevertheless, reevaluation is indicated to  ensure that he no longer has obstructive sleep apnea (OSA). I had a long chat with the patient and his wife about my findings and the diagnosis of OSA, its prognosis and treatment options. We talked about medical treatments, surgical interventions and non-pharmacological approaches. I explained in particular the risks and ramifications of untreated moderate to severe OSA, especially with respect to developing cardiovascular disease down the Road, including congestive heart failure, difficult to treat hypertension, cardiac arrhythmias, or stroke. Even type 2 diabetes has, in part, been linked to untreated OSA. Symptoms of untreated OSA include daytime sleepiness, memory problems, mood irritability and mood disorder such as depression and anxiety, lack of energy, as well as recurrent headaches, especially morning headaches. We talked about trying to maintain a healthy lifestyle in general, as well as the importance of weight control. I encouraged the patient to eat healthy, exercise daily and keep well hydrated, to keep a scheduled bedtime and wake time routine, to not skip any meals and eat healthy snacks in between meals. I advised the patient not to drive when feeling sleepy. I recommended the following at this time: sleep study with potential positive airway pressure titration. (We will score hypopneas at 3%).   I explained the sleep test procedure to the patient and also outlined possible surgical and non-surgical treatment options of OSA, including the use of a custom-made dental device (which would require a referral to a specialist dentist or oral surgeon), upper airway surgical options, such as pillar implants, radiofrequency surgery, tongue base surgery, and UPPP (which would involve a referral to an ENT surgeon). Rarely, jaw surgery such as mandibular advancement may be considered.  I also explained the CPAP treatment option to the patient, who indicated that he would be willing to try CPAP again if  the need arises. I  explained the importance of being compliant with PAP treatment, not only for insurance purposes but primarily to improve His symptoms, and for the patient's long term health benefit, including to reduce His cardiovascular risks. I answered all their questions today and the patient and his wife were in agreement. I plan to see him back after the sleep study is completed and encouraged them to call with any interim questions, concerns, problems or updates.   Thank you very much for allowing me to participate in the care of this nice patient. If I can be of any further assistance to you please do not hesitate to call me at [phone].  Sincerely,   Star Age, MD, PhD

## 2018-11-04 ENCOUNTER — Ambulatory Visit: Payer: BLUE CROSS/BLUE SHIELD | Admitting: Physician Assistant

## 2018-11-04 ENCOUNTER — Encounter: Payer: Self-pay | Admitting: Physician Assistant

## 2018-11-04 VITALS — BP 121/83 | HR 74 | Ht 70.0 in | Wt 234.4 lb

## 2018-11-04 DIAGNOSIS — F322 Major depressive disorder, single episode, severe without psychotic features: Secondary | ICD-10-CM

## 2018-11-04 DIAGNOSIS — E039 Hypothyroidism, unspecified: Secondary | ICD-10-CM

## 2018-11-04 DIAGNOSIS — M5136 Other intervertebral disc degeneration, lumbar region: Secondary | ICD-10-CM

## 2018-11-04 DIAGNOSIS — F411 Generalized anxiety disorder: Secondary | ICD-10-CM | POA: Diagnosis not present

## 2018-11-04 DIAGNOSIS — M51369 Other intervertebral disc degeneration, lumbar region without mention of lumbar back pain or lower extremity pain: Secondary | ICD-10-CM

## 2018-11-04 DIAGNOSIS — K219 Gastro-esophageal reflux disease without esophagitis: Secondary | ICD-10-CM

## 2018-11-04 MED ORDER — OMEPRAZOLE 40 MG PO CPDR: 40.0000 mg | DELAYED_RELEASE_CAPSULE | Freq: Two times a day (BID) | ORAL | 11 refills | Status: DC

## 2018-11-04 MED ORDER — PROMETHAZINE HCL 25 MG PO TABS: ORAL_TABLET | ORAL | 1 refills | Status: DC

## 2018-11-07 NOTE — Progress Notes (Signed)
BP 121/83   Pulse 74   Ht 5\' 10"  (1.778 m)   Wt 234 lb 6.4 oz (106.3 kg)   BMI 33.63 kg/m    Subjective:    Patient ID: Marvin Espinoza, male    DOB: Sep 09, 1971, 47 y.o.   MRN: [id number]  HPI: Marvin Espinoza is a 47 y.o. male presenting on 11/04/2018 for Hypothyroidism (6 month follow up ) and Manic Behavior  This patient comes in for periodic recheck on medications and conditions including DDD, hypothyroidism, depression with mood disorder, GAD, GERD. He has had another psychiatry admission and reports doing much better. He is still seeing his psychiatrist. Also seeing orthopedics for his multiple orthopedic issues. He will have labs performed..   All medications are reviewed today. There are no reports of any problems with the medications. All of the medical conditions are reviewed and updated.  Lab work is reviewed and will be ordered as medically necessary. There are no new problems reported with today's visit.   Past Medical History:  Diagnosis Date  . Anxiety   . Bipolar disorder ([place])   . CTS (carpal tunnel syndrome)   . Depression   . GERD (gastroesophageal reflux disease)   . H/O bariatric surgery   . Head injury 05/06/2017  . Hypothyroidism   . Hypothyroidism   . Sleep apnea    no need for CPAP at this time  . Sleep apnea    Relevant past medical, surgical, family and social history reviewed and updated as indicated. Interim medical history since our last visit reviewed. Allergies and medications reviewed and updated. DATA REVIEWED: CHART IN EPIC  Family History reviewed for pertinent findings.  Review of Systems  Constitutional: Negative.  Negative for appetite change and fatigue.  HENT: Negative.   Eyes: Negative.  Negative for pain and visual disturbance.  Respiratory: Negative.  Negative for cough, chest tightness, shortness of breath and wheezing.   Cardiovascular: Negative.  Negative for chest pain, palpitations and leg swelling.  Gastrointestinal:  Negative.  Negative for abdominal pain, diarrhea, nausea and vomiting.  Endocrine: Negative.   Genitourinary: Negative.   Musculoskeletal: Negative.   Skin: Negative.  Negative for color change and rash.  Neurological: Negative.  Negative for weakness, numbness and headaches.  Psychiatric/Behavioral: Negative.     Allergies as of 11/04/2018      Reactions   Aripiprazole Anaphylaxis, Swelling   "slurred speech"   Cariprazine Hcl Other (See Comments)   Latuda  [lurasidone Hcl] Other (See Comments)   Seroquel [quetiapine Fumarate]    tardive dyskinesia   Alcohol Itching   Drinking alcohol   Cariprazine Other (See Comments)   Tardive dyskenesia   Hydrocodone-acetaminophen Itching   Lurasidone Other (See Comments)   dysconesia   Victoza [liraglutide] Other (See Comments)   Severe heartburn   Victoza [liraglutide]    Severe heartburn   Bupropion Rash   Risperidone Rash      Medication List        Accurate as of 11/04/18 11:59 PM. Always use your most recent med list.          ALPRAZolam 1 MG tablet Commonly known as:  XANAX Take 1 mg by mouth 4 (four) times daily as needed for anxiety.   CALCIUM 1200 PO Take by mouth 2 (two) times daily.   Carbonyl Iron 15 MG Chew Chew by mouth daily.   divalproex 500 MG DR tablet Commonly known as:  DEPAKOTE Take 500 mg by mouth 5 (five) times daily.  levothyroxine 100 MCG tablet Commonly known as:  SYNTHROID, LEVOTHROID TAKE ONE (1) TABLET EACH DAY   lubiprostone 24 MCG capsule Commonly known as:  AMITIZA TAKE 1 CAPSULE TWICE DAILY WITH A MEAL   multivitamin with minerals Tabs tablet Take 1 tablet by mouth 2 (two) times daily. For Vitamin supplementation   OLANZapine-FLUoxetine 6-25 MG capsule Commonly known as:  SYMBYAX Take 1 capsule by mouth at bedtime. For mood control   omeprazole 40 MG capsule Commonly known as:  PRILOSEC Take 1 capsule (40 mg total) by mouth 2 (two) times daily. TAKE ONE (1) CAPSULE EACH  DAY   promethazine 25 MG tablet Commonly known as:  PHENERGAN TAKE 1 TABLET EVERY 6 HOURS AS NEEDED FOR NAUSEA AND VOMITING   tizanidine 6 MG capsule Commonly known as:  ZANAFLEX TAKE ONE (1) CAPSULE THREE (3) TIMES EACH DAY   vitamin B-12 250 MCG tablet Commonly known as:  CYANOCOBALAMIN Take 250 mcg by mouth daily.          Objective:    BP 121/83   Pulse 74   Ht 5\' 10"  (1.778 m)   Wt 234 lb 6.4 oz (106.3 kg)   BMI 33.63 kg/m   Allergies  Allergen Reactions  . Aripiprazole Anaphylaxis and Swelling    "slurred speech"   . Cariprazine Hcl Other (See Comments)  . Latuda  [Lurasidone Hcl] Other (See Comments)  . Seroquel [Quetiapine Fumarate]     tardive dyskinesia  . Alcohol Itching    Drinking alcohol  . Cariprazine Other (See Comments)    Tardive dyskenesia  . Hydrocodone-Acetaminophen Itching  . Lurasidone Other (See Comments)    dysconesia  . Victoza [Liraglutide] Other (See Comments)    Severe heartburn   . Victoza [Liraglutide]     Severe heartburn  . Bupropion Rash  . Risperidone Rash    Wt Readings from Last 3 Encounters:  11/04/18 234 lb 6.4 oz (106.3 kg)  11/03/18 231 lb (104.8 kg)  10/03/18 220 lb (99.8 kg)    Physical Exam  Constitutional: He appears well-developed and well-nourished. No distress.  HENT:  Head: Normocephalic and atraumatic.  Eyes: Pupils are equal, round, and reactive to light. Conjunctivae and EOM are normal.  Cardiovascular: Normal rate, regular rhythm and normal heart sounds.  Pulmonary/Chest: Effort normal and breath sounds normal. No respiratory distress.  Skin: Skin is warm and dry.  Psychiatric: He has a normal mood and affect. His behavior is normal.  Nursing note and vitals reviewed.   Results for orders placed or performed during the hospital encounter of 10/03/18  Comprehensive metabolic panel  Result Value Ref Range   Sodium 139 135 - 145 mmol/L   Potassium 3.4 (L) 3.5 - 5.1 mmol/L   Chloride 107 98 - 111  mmol/L   CO2 25 22 - 32 mmol/L   Glucose, Bld 132 (H) 70 - 99 mg/dL   BUN 16 6 - 20 mg/dL   Creatinine, Ser 0.79 0.61 - 1.24 mg/dL   Calcium 8.4 (L) 8.9 - 10.3 mg/dL   Total Protein 6.4 (L) 6.5 - 8.1 g/dL   Albumin 3.6 3.5 - 5.0 g/dL   AST 23 15 - 41 U/L   ALT 30 0 - 44 U/L   Alkaline Phosphatase 77 38 - 126 U/L   Total Bilirubin 0.3 0.3 - 1.2 mg/dL   GFR calc non Af Amer >60 >60 mL/min   GFR calc Af Amer >60 >60 mL/min   Anion gap 7 5 - 15  Ethanol  Result Value Ref Range   Alcohol, Ethyl (B) <14 <97 mg/dL  Salicylate level  Result Value Ref Range   Salicylate Lvl <0.2 2.8 - 30.0 mg/dL  Acetaminophen level  Result Value Ref Range   Acetaminophen (Tylenol), Serum <10 (L) 10 - 30 ug/mL  cbc  Result Value Ref Range   WBC 7.1 4.0 - 10.5 K/uL   RBC 4.38 4.22 - 5.81 MIL/uL   Hemoglobin 12.6 (L) 13.0 - 17.0 g/dL   HCT 39.4 39.0 - 52.0 %   MCV 90.0 80.0 - 100.0 fL   MCH 28.8 26.0 - 34.0 pg   MCHC 32.0 30.0 - 36.0 g/dL   RDW 13.0 11.5 - 15.5 %   Platelets 234 150 - 400 K/uL   nRBC 0.0 0.0 - 0.2 %  Rapid urine drug screen (hospital performed)  Result Value Ref Range   Opiates NONE DETECTED NONE DETECTED   Cocaine NONE DETECTED NONE DETECTED   Benzodiazepines POSITIVE (A) NONE DETECTED   Amphetamines NONE DETECTED NONE DETECTED   Tetrahydrocannabinol NONE DETECTED NONE DETECTED   Barbiturates NONE DETECTED NONE DETECTED  CBG monitoring, ED  Result Value Ref Range   Glucose-Capillary 128 (H) 70 - 99 mg/dL      Assessment & Plan:   1. DDD (degenerative disc disease), lumbar  2. Hypothyroidism, unspecified type  3. Severe major depression (HCC) - divalproex (DEPAKOTE) 500 MG DR tablet; Take 500 mg by mouth 5 (five) times daily.  4. GAD (generalized anxiety disorder) - ALPRAZolam (XANAX) 1 MG tablet; Take 1 mg by mouth 4 (four) times daily as needed for anxiety.  5. Gastroesophageal reflux disease without esophagitis - omeprazole (PRILOSEC) 40 MG capsule; Take 1  capsule (40 mg total) by mouth 2 (two) times daily. TAKE ONE (1) CAPSULE EACH DAY  Dispense: 60 capsule; Refill: 11   Continue all other maintenance medications as listed above.  Follow up plan: Return in about 2 months (around 01/04/2019) for recheck and labs.  Educational handout given for [place] PA-C [place] [address]  [place], [place] [address] [phone]   11/07/2018, 4:10 PM

## 2018-11-10 DIAGNOSIS — G894 Chronic pain syndrome: Secondary | ICD-10-CM | POA: Diagnosis not present

## 2018-11-10 DIAGNOSIS — Z79899 Other long term (current) drug therapy: Secondary | ICD-10-CM | POA: Diagnosis not present

## 2018-11-10 DIAGNOSIS — M5136 Other intervertebral disc degeneration, lumbar region: Secondary | ICD-10-CM | POA: Diagnosis not present

## 2018-11-27 ENCOUNTER — Ambulatory Visit (INDEPENDENT_AMBULATORY_CARE_PROVIDER_SITE_OTHER): Payer: BLUE CROSS/BLUE SHIELD | Admitting: Neurology

## 2018-11-27 DIAGNOSIS — R0683 Snoring: Secondary | ICD-10-CM

## 2018-11-27 DIAGNOSIS — G4761 Periodic limb movement disorder: Secondary | ICD-10-CM

## 2018-11-27 DIAGNOSIS — R351 Nocturia: Secondary | ICD-10-CM

## 2018-11-27 DIAGNOSIS — G4733 Obstructive sleep apnea (adult) (pediatric): Secondary | ICD-10-CM | POA: Diagnosis not present

## 2018-11-27 DIAGNOSIS — G4719 Other hypersomnia: Secondary | ICD-10-CM

## 2018-11-27 DIAGNOSIS — Z820 Family history of epilepsy and other diseases of the nervous system: Secondary | ICD-10-CM

## 2018-11-27 DIAGNOSIS — G4720 Circadian rhythm sleep disorder, unspecified type: Secondary | ICD-10-CM

## 2018-11-27 DIAGNOSIS — E669 Obesity, unspecified: Secondary | ICD-10-CM

## 2018-11-30 ENCOUNTER — Telehealth: Payer: Self-pay

## 2018-11-30 DIAGNOSIS — F3174 Bipolar disorder, in full remission, most recent episode manic: Secondary | ICD-10-CM | POA: Diagnosis not present

## 2018-11-30 DIAGNOSIS — F3131 Bipolar disorder, current episode depressed, mild: Secondary | ICD-10-CM | POA: Diagnosis not present

## 2018-11-30 NOTE — Telephone Encounter (Signed)
-----   Message from Star Age, MD sent at 11/30/2018  8:34 AM EST ----- Patient referred by Particia Nearing, PA, seen by me on 11/03/18, diagnostic PSG on 11/27/18.   Please call and notify the patient that the recent sleep study did not show any significant obstructive sleep apnea with the exception of mild snoring and mild REM sleep related sleep apnea; for this, CPAP therapy is not warranted. Further weight loss may suffice as treatment. Moderate PLMs (periodic limb movements of sleep) were noted during this study with no significant arousals; the mere presence of PLMs does not warrant treatment.  At this juncture, I would recommend, he FU with PCP.  Please remind patient to try to maintain good sleep hygiene, which means: Keep a regular sleep and wake schedule and make enough time for sleep (7 1/2 to 8 1/2 hours for the average adult), try not to exercise or have a meal within 2 hours of your bedtime, try to keep your bedroom conducive for sleep, that is, cool and dark, without light distractors such as an illuminated alarm clock, and refrain from watching TV right before sleep or in the middle of the night and do not keep the TV or radio on during the night. If a nightlight is used, have it away from the visual field. Also, try not to use or play on electronic devices at bedtime, such as your cell phone, tablet PC or laptop. If you like to read at bedtime on an electronic device, try to dim the background light as much as possible. Do not eat in the middle of the night. Keep pets away from the bedroom environment. For stress relief, try meditation, deep breathing exercises (there are many books and CDs available), a white noise machine or fan can help to diffuse other noise distractors, such as traffic noise. Do not drink alcohol before bedtime, as it can disturb sleep and cause middle of the night awakenings. Never mix alcohol and sedating medications! Avoid narcotic pain medication close to bedtime, as  opioids/narcotics can suppress breathing drive and breathing effort.    Star Age, MD, PhD [facility])

## 2018-11-30 NOTE — Progress Notes (Signed)
Patient referred by Particia Nearing, PA, seen by me on 11/03/18, diagnostic PSG on 11/27/18.   Please call and notify the patient that the recent sleep study did not show any significant obstructive sleep apnea with the exception of mild snoring and mild REM sleep related sleep apnea; for this, CPAP therapy is not warranted. Further weight loss may suffice as treatment. Moderate PLMs (periodic limb movements of sleep) were noted during this study with no significant arousals; the mere presence of PLMs does not warrant treatment.  At this juncture, I would recommend, he FU with PCP.  Please remind patient to try to maintain good sleep hygiene, which means: Keep a regular sleep and wake schedule and make enough time for sleep (7 1/2 to 8 1/2 hours for the average adult), try not to exercise or have a meal within 2 hours of your bedtime, try to keep your bedroom conducive for sleep, that is, cool and dark, without light distractors such as an illuminated alarm clock, and refrain from watching TV right before sleep or in the middle of the night and do not keep the TV or radio on during the night. If a nightlight is used, have it away from the visual field. Also, try not to use or play on electronic devices at bedtime, such as your cell phone, tablet PC or laptop. If you like to read at bedtime on an electronic device, try to dim the background light as much as possible. Do not eat in the middle of the night. Keep pets away from the bedroom environment. For stress relief, try meditation, deep breathing exercises (there are many books and CDs available), a white noise machine or fan can help to diffuse other noise distractors, such as traffic noise. Do not drink alcohol before bedtime, as it can disturb sleep and cause middle of the night awakenings. Never mix alcohol and sedating medications! Avoid narcotic pain medication close to bedtime, as opioids/narcotics can suppress breathing drive and breathing effort.     Star Age, MD, PhD [facility])

## 2018-11-30 NOTE — Telephone Encounter (Signed)
I called pt. I advised pt that Dr. Rexene Alberts reviewed pt's sleep study and found that did not show any significant osa with the exception of mild snoring and mild REM sleep related sleep apnea for which CPAP is not warranted. I advised him that the moderate PLMs does not warrant treatment. Dr. Rexene Alberts recommends that pt follow up with his PCP. I reviewed sleep hygiene recommendations with the pt, including trying to keep a regular sleep wake schedule, avoiding electronics in the bedroom, keeping the bedroom cool, dark, and quiet, and avoiding eating or exercising within 2 hours of bedtime as well as eating in the middle of the night. I advised pt to keep pets out of the bedroom. I discussed with pt the importance of stress relief and to try meditation, deep breathing exercises, and/or a white noise machine or fan to diffuse other noise distractors. I advised pt to not drink alcohol before bedtime and to never mix alcohol and sedating medications. Pt was advised to avoid narcotic pain medication close to bedtime. I advised pt that a copy of these sleep study results will be sent to Particia Nearing, PA. Pt verbalized understanding of results.  Pt is wondering how "sleep apnea" can be expunged from his health records. He had to stay over night after a surgery because it was noted that pt has a history of sleep apnea. I advised pt that I would send this question to our MR department. Pt verbalized understanding.

## 2018-11-30 NOTE — Procedures (Signed)
PATIENT'S NAME:  Marvin Espinoza, Marvin Espinoza DOB:      February 12, 1971      MR#:    [id number]     DATE OF RECORDING: 11/27/2018 REFERRING M.D.:  Particia Nearing, PA-C Study Performed:   Baseline Polysomnogram HISTORY: 47 year old man with a history of mood disorder, obesity with s/p bariatric surgery, chronic low back pain, reflux disease, hypothyroidism, dizziness and history of blackout spells, who was previously diagnosed with obstructive sleep apnea and placed on CPAP therapy. He no longer uses CPAP. A compliance download from January 2017 through January 2018 showed non-usage. The patient endorsed the Epworth Sleepiness Scale at 9/24 points. The patient's weight 231 pounds with a height of 70 (inches), resulting in a BMI of 33.1 kg/m2. The patient's neck circumference measured 16.5 inches.  CURRENT MEDICATIONS: Calcium, Carbonyl Iron, Atarax, Synthroid, Amitiza, Multivitamin, Symbyax, Prilosec, Phenergan, Zanaflex, Cyanocobalamin.   PROCEDURE:  This is a multichannel digital polysomnogram utilizing the Somnostar 11.2 system.  Electrodes and sensors were applied and monitored per AASM Specifications.   EEG, EOG, Chin and Limb EMG, were sampled at 200 Hz.  ECG, Snore and Nasal Pressure, Thermal Airflow, Respiratory Effort, CPAP Flow and Pressure, Oximetry was sampled at 50 Hz. Digital video and audio were recorded.      BASELINE STUDY  Lights Out was at 21:07 and Lights On at 05:03.  Total recording time (TRT) was 476.5 minutes, with a total sleep time (TST) of 425 minutes. The patient's sleep latency was 24.5 minutes. REM latency was 152 minutes, which is delayed. The sleep efficiency was 89.2 %.     SLEEP ARCHITECTURE: WASO (Wake after sleep onset) was 18.5 minutes with minimal sleep fragmentation noted. There were 11 minutes in Stage N1, 308.5 minutes Stage N2, 0 minutes Stage N3 and 105.5 minutes in Stage REM.  The percentage of Stage N1 was 2.6%, Stage N2 was 72.6%, which is increased, Stage N3 was absent and Stage  R (REM sleep) was 24.8%, which is normal. The arousals were noted as: 11 were spontaneous, 2 were associated with PLMs, 2 were associated with respiratory events.  RESPIRATORY ANALYSIS:  There were a total of 25 respiratory events:  2 obstructive apneas, 18 central apneas and 0 mixed apneas with a total of 20 apneas and an apnea index (AI) of 2.8 /hour. There were 5 hypopneas with a hypopnea index of .7 /hour. The patient also had 0 respiratory event related arousals (RERAs).      The total APNEA/HYPOPNEA INDEX (AHI) was 3.5/hour and the total RESPIRATORY DISTURBANCE INDEX was 3.5 /hour.  22 events occurred in REM sleep and 6 events in NREM. The REM AHI was 12.5 /hour, versus a non-REM AHI of .6. The patient spent 369.5 minutes of total sleep time in the supine position and 56 minutes in non-supine.. The supine AHI was 4.0 versus a non-supine AHI of 0.0.  OXYGEN SATURATION & C02:  The Wake baseline 02 saturation was 96%, with the lowest being 90% (49% on the technical report was an error). Time spent below 89% saturation equaled 14 minutes.  PERIODIC LIMB MOVEMENTS: The patient had a total of 211 Periodic Limb Movements.  The Periodic Limb Movement (PLM) index was 29.8 and the PLM Arousal index was .3/hour.  Audio and video analysis did not show any abnormal or unusual movements, behaviors, phonations or vocalizations. The patient took no bathroom breaks. Mild snoring was noted. The EKG was in keeping with normal sinus rhythm (NSR).  Post-study, the patient indicated that sleep was worse  than usual.   IMPRESSION:  1. Periodic Limb Movement Disorder (PLMD) 2. Dysfunctions associated with sleep stages or arousal from sleep  RECOMMENDATIONS:  1. This study does not demonstrate any significant obstructive or central sleep disordered breathing with the exception of mild snoring and mild REM sleep related sleep apnea; for this CPAP therapy is not warranted. Further weight loss may suffice as  treatment.  2. Moderate PLMs (periodic limb movements of sleep) were noted during this study with no significant arousals; clinical correlation is recommended. There mere presence of PLMs does not warrant treatment.  3. This study shows some sleep fragmentation and abnormal sleep stage percentages; these are nonspecific findings and per se do not signify an intrinsic sleep disorder or a cause for the patient's sleep-related symptoms. Causes include (but are not limited to) the first night effect of the sleep study, circadian rhythm disturbances, medication effect or an underlying mood disorder or medical problem.  4. The patient should be cautioned not to drive, work at heights, or operate dangerous or heavy equipment when tired or sleepy. Review and reiteration of good sleep hygiene measures should be pursued with any patient. 5. The patient will be advised to follow up with the referring provider, who will be notified of the test results.  I certify that I have reviewed the entire raw data recording prior to the issuance of this report in accordance with the Standards of Accreditation of the American Academy of Sleep Medicine (AASM)   Star Age, MD, PhD Diplomat, American Board of Neurology and Sleep Medicine (Neurology and Sleep Medicine)

## 2018-12-07 ENCOUNTER — Ambulatory Visit (INDEPENDENT_AMBULATORY_CARE_PROVIDER_SITE_OTHER): Payer: BLUE CROSS/BLUE SHIELD | Admitting: Neurology

## 2018-12-07 ENCOUNTER — Encounter: Payer: Self-pay | Admitting: Neurology

## 2018-12-07 VITALS — BP 100/70 | HR 56 | Ht 70.0 in | Wt 241.0 lb

## 2018-12-07 DIAGNOSIS — R55 Syncope and collapse: Secondary | ICD-10-CM

## 2018-12-07 NOTE — Patient Instructions (Signed)
1.  Continue the Depakote as it helps prevent possible seizures as well as helps anxiety. 2.  Remember [facility] law states that you should not drive for 6 months after having episode of loss of consciousness for unknown cause. 3.  Follow up in 5 months.
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
## 2018-12-15 DIAGNOSIS — Z79899 Other long term (current) drug therapy: Secondary | ICD-10-CM | POA: Diagnosis not present

## 2018-12-15 DIAGNOSIS — M1990 Unspecified osteoarthritis, unspecified site: Secondary | ICD-10-CM | POA: Diagnosis not present

## 2018-12-31 ENCOUNTER — Ambulatory Visit (INDEPENDENT_AMBULATORY_CARE_PROVIDER_SITE_OTHER): Payer: BLUE CROSS/BLUE SHIELD | Admitting: Pediatrics

## 2018-12-31 ENCOUNTER — Encounter: Payer: Self-pay | Admitting: Pediatrics

## 2018-12-31 ENCOUNTER — Telehealth: Payer: Self-pay | Admitting: *Deleted

## 2018-12-31 VITALS — BP 90/57 | HR 74 | Temp 97.5°F | Ht 70.0 in | Wt 241.0 lb

## 2018-12-31 DIAGNOSIS — F319 Bipolar disorder, unspecified: Secondary | ICD-10-CM | POA: Diagnosis not present

## 2018-12-31 DIAGNOSIS — B351 Tinea unguium: Secondary | ICD-10-CM | POA: Diagnosis not present

## 2018-12-31 DIAGNOSIS — M79609 Pain in unspecified limb: Secondary | ICD-10-CM | POA: Diagnosis not present

## 2018-12-31 DIAGNOSIS — G629 Polyneuropathy, unspecified: Secondary | ICD-10-CM | POA: Diagnosis not present

## 2018-12-31 MED ORDER — GABAPENTIN 100 MG PO CAPS: 100.0000 mg | ORAL_CAPSULE | Freq: Two times a day (BID) | ORAL | 0 refills | Status: DC

## 2018-12-31 MED ORDER — CICLOPIROX 8 % EX SOLN: Freq: Every day | CUTANEOUS | 0 refills | Status: DC

## 2018-12-31 NOTE — Telephone Encounter (Signed)
I mailed a request for amendment of health information to the pt. Pt need to explain how the entry is  incorrect or incomplete and mail it back to [place].

## 2018-12-31 NOTE — Progress Notes (Signed)
Subjective:   Patient ID: Marvin Espinoza, male    DOB: 08-13-1971, 48 y.o.   MRN: [id number] CC: Foot Pain (pt here today c/o feet pain )  HPI: Marvin Espinoza is a 48 y.o. male   Was off of work for 6 months due to bilateral rotator cuff repair surgeries.  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  Regularly crawling on the ground at times.  For the last week he has had worsening pain in bilateral great toes.  He has had some thickening of the toenails for the last few months.  No redness, no discharge.  Sometimes he wears boots, sometimes he wears tennis shoes to work.  Foot pain bothers him with both.  Yesterday his feet were quite red, tender after getting out of a very hot bath.  No fevers.  He has had gout before.  He did take colchicine a couple days ago with some improvement in symptoms.  His feet feel very itchy, tingly at times at night over this last week.  He has a history of diabetes, with improved A1c months ago, most recently A1c up to 6.8 two months ago.  Bipolar disorder: Following with psychiatry.  He has not been sleeping for the last week.  Has follow-up appointment upcoming with psychiatry with 2 weeks, with his pcp next week.  He recently stopped olanzapine/fluoxetine combination pill because it was raising his blood sugars.  His mood was better when he was taking it.  He feels safe at home right now.  If he has any thoughts of self-harm, he says he [facility].  He had to do that 8 times last year.  Relevant past medical, surgical, family and social history reviewed. Allergies and medications reviewed and updated. Social History   Tobacco Use  Smoking Status Never Smoker  Smokeless Tobacco Never Used   ROS: Per HPI   Objective:    BP (!) 90/57   Pulse 74   Temp (!) 97.5 F (36.4 C) (Oral)   Ht 5\' 10"  (1.778 m)   Wt 241 lb (109.3 kg)   BMI 34.58 kg/m   Wt Readings from Last 3 Encounters:  12/31/18 241 lb (109.3 kg)  12/07/18 241 lb  (109.3 kg)  11/04/18 234 lb 6.4 oz (106.3 kg)    Gen: NAD, alert, cooperative with exam, NCAT EYES: EOMI, no conjunctival injection, or no icterus CV: NRRR, normal S1/S2, no murmur, distal pulses 2+ b/l Resp: CTABL, no wheezes, normal WOB Ext: No edema, warm Neuro: Alert and oriented, sensation normal bilateral feet. MSK: No tenderness on feet. Skin: Bilateral great toes thickened, chipping at distal edge.  Tender to palpation along sides of nails bilaterally.  Other toenails normal-appearing.  Assessment & Plan:  Marvin Espinoza was seen today for foot pain.  Diagnoses and all orders for this visit:  Onychomycosis Trial of below. -     ciclopirox (PENLAC) 8 % solution; Apply topically at bedtime. Apply over nail and surrounding skin. Apply daily over previous coat. After seven (7) days, may remove with alcohol and continue cycle.  Pain due to onychomycosis of nail Quite tender on sides of nail.  If not improving should have evaluation for possible ingrown toenails. -     Ambulatory referral to Podiatry  Bipolar affective disorder, remission status unspecified (Marvin Espinoza) Patient to call her psychiatrist today to let them know about not needing sleep.  Return precautions discussed.  Patient comfortable with plan.  Neuropathy Will do trial  below.  Do not take gabapentin and drive.  Start just once a day in evening. -     gabapentin (NEURONTIN) 100 MG capsule; Take 1 capsule (100 mg total) by mouth 2 (two) times daily.   Follow up plan: Follow-up with PCP next week as scheduled, with psychiatry as soon as able. Assunta Found, MD [place]

## 2019-01-05 ENCOUNTER — Encounter: Payer: Self-pay | Admitting: Physician Assistant

## 2019-01-05 ENCOUNTER — Ambulatory Visit: Payer: BLUE CROSS/BLUE SHIELD | Admitting: Physician Assistant

## 2019-01-05 VITALS — BP 121/76 | HR 61 | Temp 97.2°F | Ht 70.0 in | Wt 239.2 lb

## 2019-01-05 DIAGNOSIS — E039 Hypothyroidism, unspecified: Secondary | ICD-10-CM

## 2019-01-05 DIAGNOSIS — B351 Tinea unguium: Secondary | ICD-10-CM | POA: Diagnosis not present

## 2019-01-05 DIAGNOSIS — F322 Major depressive disorder, single episode, severe without psychotic features: Secondary | ICD-10-CM

## 2019-01-05 DIAGNOSIS — G629 Polyneuropathy, unspecified: Secondary | ICD-10-CM

## 2019-01-05 DIAGNOSIS — Z0000 Encounter for general adult medical examination without abnormal findings: Secondary | ICD-10-CM

## 2019-01-05 DIAGNOSIS — M5136 Other intervertebral disc degeneration, lumbar region: Secondary | ICD-10-CM | POA: Diagnosis not present

## 2019-01-05 LAB — BAYER DCA HB A1C WAIVED: HB A1C (BAYER DCA - WAIVED): 6 % (ref <7.0)

## 2019-01-05 LAB — CBC WITH DIFFERENTIAL/PLATELET
Basophils Absolute: 0.1 10*3/uL (ref 0.0–0.2)
Basos: 1 %
EOS (ABSOLUTE): 0.3 10*3/uL (ref 0.0–0.4)
Eos: 4 %
Hematocrit: 38.2 % (ref 37.5–51.0)
Hemoglobin: 12.7 g/dL — ABNORMAL LOW (ref 13.0–17.7)
Immature Grans (Abs): 0 10*3/uL (ref 0.0–0.1)
Immature Granulocytes: 0 %
Lymphocytes Absolute: 2.5 10*3/uL (ref 0.7–3.1)
Lymphs: 30 %
MCH: 29.2 pg (ref 26.6–33.0)
MCHC: 33.2 g/dL (ref 31.5–35.7)
MCV: 88 fL (ref 79–97)
Monocytes Absolute: 0.9 10*3/uL (ref 0.1–0.9)
Monocytes: 11 %
Neutrophils Absolute: 4.5 10*3/uL (ref 1.4–7.0)
Neutrophils: 54 %
Platelets: 227 10*3/uL (ref 150–450)
RBC: 4.35 x10E6/uL (ref 4.14–5.80)
RDW: 13.2 % (ref 11.6–15.4)
WBC: 8.3 10*3/uL (ref 3.4–10.8)

## 2019-01-05 LAB — LIPID PANEL
Chol/HDL Ratio: 2.4 ratio (ref 0.0–5.0)
Cholesterol, Total: 122 mg/dL (ref 100–199)
HDL: 50 mg/dL (ref >39)
LDL Calculated: 58 mg/dL (ref 0–99)
Triglycerides: 72 mg/dL (ref 0–149)
VLDL Cholesterol Cal: 14 mg/dL (ref 5–40)

## 2019-01-05 LAB — CMP14+EGFR
ALT: 28 IU/L (ref 0–44)
AST: 30 IU/L (ref 0–40)
Albumin/Globulin Ratio: 1.9 (ref 1.2–2.2)
Albumin: 4.2 g/dL (ref 4.0–5.0)
Alkaline Phosphatase: 54 IU/L (ref 39–117)
BUN/Creatinine Ratio: 14 (ref 9–20)
BUN: 13 mg/dL (ref 6–24)
Bilirubin Total: 0.5 mg/dL (ref 0.0–1.2)
CO2: 25 mmol/L (ref 20–29)
Calcium: 9.2 mg/dL (ref 8.7–10.2)
Chloride: 101 mmol/L (ref 96–106)
Creatinine, Ser: 0.92 mg/dL (ref 0.76–1.27)
GFR calc Af Amer: 113 mL/min/{1.73_m2} (ref >59)
GFR calc non Af Amer: 97 mL/min/{1.73_m2} (ref >59)
Globulin, Total: 2.2 g/dL (ref 1.5–4.5)
Glucose: 94 mg/dL (ref 65–99)
Potassium: 4.5 mmol/L (ref 3.5–5.2)
Sodium: 139 mmol/L (ref 134–144)
Total Protein: 6.4 g/dL (ref 6.0–8.5)

## 2019-01-05 MED ORDER — GABAPENTIN 100 MG PO CAPS: 100.0000 mg | ORAL_CAPSULE | Freq: Two times a day (BID) | ORAL | 5 refills | Status: DC

## 2019-01-07 DIAGNOSIS — F410 Panic disorder [episodic paroxysmal anxiety] without agoraphobia: Secondary | ICD-10-CM | POA: Diagnosis not present

## 2019-01-07 DIAGNOSIS — F3132 Bipolar disorder, current episode depressed, moderate: Secondary | ICD-10-CM | POA: Diagnosis not present

## 2019-01-07 DIAGNOSIS — F3111 Bipolar disorder, current episode manic without psychotic features, mild: Secondary | ICD-10-CM | POA: Diagnosis not present

## 2019-01-07 MED ORDER — LEVOTHYROXINE SODIUM 100 MCG PO TABS: ORAL_TABLET | ORAL | 5 refills | Status: DC

## 2019-01-07 NOTE — Progress Notes (Signed)
BP 121/76   Pulse 61   Temp (!) 97.2 F (36.2 C) (Oral)   Ht 5' 10" (1.778 m)   Wt 239 lb 3.2 oz (108.5 kg)   BMI 34.32 kg/m    Subjective:    Patient ID: Marvin Espinoza, male    DOB: [birth year], 48 y.o.   MRN: [id number]  HPI: Marvin Espinoza is a 48 y.o. male presenting on 01/05/2019 for Hypothyroidism and Depression  This patient comes in for periodic recheck on medications and conditions including for neuropathy, degenerative disc disease.  He is still being seen by psychiatry for his severe depression.  He also has hypothyroidism. He is experiencing onychomycosis of both great toenails.  The right being greater than left.  He has tried some medication treatment but has not helped yet.  He does not have any soreness at this time.  He has in the past.  He has joined to be just on some very regular basis to keep his feet dry and cool..   All medications are reviewed today. There are no reports of any problems with the medications. All of the medical conditions are reviewed and updated.  Lab work is reviewed and will be ordered as medically necessary. There are no new problems reported with today's visit.   Past Medical History:  Diagnosis Date  . Anxiety   . Bipolar disorder ([place])   . CTS (carpal tunnel syndrome)   . Depression   . GERD (gastroesophageal reflux disease)   . H/O bariatric surgery   . Head injury 05/06/2017  . Hypothyroidism   . Hypothyroidism   . Sleep apnea    no need for CPAP at this time  . Sleep apnea    Relevant past medical, surgical, family and social history reviewed and updated as indicated. Interim medical history since our last visit reviewed. Allergies and medications reviewed and updated. DATA REVIEWED: CHART IN EPIC  Family History reviewed for pertinent findings.  Review of Systems  Constitutional: Negative.  Negative for appetite change and fatigue.  HENT: Negative.   Eyes: Negative.  Negative for pain and visual disturbance.    Respiratory: Negative.  Negative for cough, chest tightness, shortness of breath and wheezing.   Cardiovascular: Negative.  Negative for chest pain, palpitations and leg swelling.  Gastrointestinal: Negative.  Negative for abdominal pain, diarrhea, nausea and vomiting.  Endocrine: Negative.   Genitourinary: Negative.   Musculoskeletal: Positive for arthralgias, back pain and joint swelling.  Skin: Positive for color change. Negative for rash.  Neurological: Negative.  Negative for weakness, numbness and headaches.  Psychiatric/Behavioral: Negative.     Allergies as of 01/05/2019      Reactions   Aripiprazole Anaphylaxis, Swelling   "slurred speech"   Cariprazine Hcl Other (See Comments)   Latuda  [lurasidone Hcl] Other (See Comments)   Seroquel [quetiapine Fumarate]    tardive dyskinesia   Alcohol Itching   Drinking alcohol   Cariprazine Other (See Comments)   Tardive dyskenesia   Hydrocodone-acetaminophen Itching   Lurasidone Other (See Comments)   dysconesia   Victoza [liraglutide] Other (See Comments)   Severe heartburn   Victoza [liraglutide]    Severe heartburn   Bupropion Rash   Risperidone Rash      Medication List       Accurate as of January 05, 2019 11:59 PM. Always use your most recent med list.        ALPRAZolam 1 MG tablet Commonly known as:  XANAX Take 1 mg by  mouth 4 (four) times daily as needed for anxiety.   Buprenorphine 15 MCG/HR Ptwk 15 mcg.   CALCIUM 1200 PO Take by mouth 2 (two) times daily.   Carbonyl Iron 15 MG Chew Chew by mouth daily.   divalproex 500 MG DR tablet Commonly known as:  DEPAKOTE Take 500 mg by mouth 5 (five) times daily.   gabapentin 100 MG capsule Commonly known as:  NEURONTIN Take 1 capsule (100 mg total) by mouth 2 (two) times daily.   levothyroxine 100 MCG tablet Commonly known as:  SYNTHROID, LEVOTHROID TAKE ONE (1) TABLET EACH DAY   lithium 300 MG tablet Take 300 mg by mouth daily.   lubiprostone 24  MCG capsule Commonly known as:  AMITIZA TAKE 1 CAPSULE TWICE DAILY WITH A MEAL   multivitamin with minerals Tabs tablet Take 1 tablet by mouth 2 (two) times daily. For Vitamin supplementation   omeprazole 40 MG capsule Commonly known as:  PRILOSEC Take 1 capsule (40 mg total) by mouth 2 (two) times daily. TAKE ONE (1) CAPSULE EACH DAY   promethazine 25 MG tablet Commonly known as:  PHENERGAN TAKE 1 TABLET EVERY 6 HOURS AS NEEDED FOR NAUSEA AND VOMITING   tizanidine 6 MG capsule Commonly known as:  ZANAFLEX TAKE ONE (1) CAPSULE THREE (3) TIMES EACH DAY   vitamin B-12 250 MCG tablet Commonly known as:  CYANOCOBALAMIN Take 250 mcg by mouth daily.   zolpidem 10 MG tablet Commonly known as:  AMBIEN Take 10 mg by mouth at bedtime.          Objective:    BP 121/76   Pulse 61   Temp (!) 97.2 F (36.2 C) (Oral)   Ht 5' 10" (1.778 m)   Wt 239 lb 3.2 oz (108.5 kg)   BMI 34.32 kg/m   Allergies  Allergen Reactions  . Aripiprazole Anaphylaxis and Swelling    "slurred speech"   . Cariprazine Hcl Other (See Comments)  . Latuda  [Lurasidone Hcl] Other (See Comments)  . Seroquel [Quetiapine Fumarate]     tardive dyskinesia  . Alcohol Itching    Drinking alcohol  . Cariprazine Other (See Comments)    Tardive dyskenesia  . Hydrocodone-Acetaminophen Itching  . Lurasidone Other (See Comments)    dysconesia  . Victoza [Liraglutide] Other (See Comments)    Severe heartburn   . Victoza [Liraglutide]     Severe heartburn  . Bupropion Rash  . Risperidone Rash    Wt Readings from Last 3 Encounters:  01/05/19 239 lb 3.2 oz (108.5 kg)  12/31/18 241 lb (109.3 kg)  12/07/18 241 lb (109.3 kg)    Physical Exam Vitals signs and nursing note reviewed.  Constitutional:      General: He is not in acute distress.    Appearance: He is well-developed.  HENT:     Head: Normocephalic and atraumatic.  Eyes:     Conjunctiva/sclera: Conjunctivae normal.     Pupils: Pupils are equal,  round, and reactive to light.  Cardiovascular:     Rate and Rhythm: Normal rate and regular rhythm.     Heart sounds: Normal heart sounds.  Pulmonary:     Effort: Pulmonary effort is normal. No respiratory distress.     Breath sounds: Normal breath sounds.  Skin:    General: Skin is warm and dry.  Psychiatric:        Behavior: Behavior normal.     Results for orders placed or performed in visit on 01/05/19  CBC with  Differential/Platelet  Result Value Ref Range   WBC 8.3 3.4 - 10.8 x10E3/uL   RBC 4.35 4.14 - 5.80 x10E6/uL   Hemoglobin 12.7 (L) 13.0 - 17.7 g/dL   Hematocrit 38.2 37.5 - 51.0 %   MCV 88 79 - 97 fL   MCH 29.2 26.6 - 33.0 pg   MCHC 33.2 31.5 - 35.7 g/dL   RDW 13.2 11.6 - 15.4 %   Platelets 227 150 - 450 x10E3/uL   Neutrophils 54 Not Estab. %   Lymphs 30 Not Estab. %   Monocytes 11 Not Estab. %   Eos 4 Not Estab. %   Basos 1 Not Estab. %   Neutrophils Absolute 4.5 1.4 - 7.0 x10E3/uL   Lymphocytes Absolute 2.5 0.7 - 3.1 x10E3/uL   Monocytes Absolute 0.9 0.1 - 0.9 x10E3/uL   EOS (ABSOLUTE) 0.3 0.0 - 0.4 x10E3/uL   Basophils Absolute 0.1 0.0 - 0.2 x10E3/uL   Immature Granulocytes 0 Not Estab. %   Immature Grans (Abs) 0.0 0.0 - 0.1 x10E3/uL  CMP14+EGFR  Result Value Ref Range   Glucose 94 65 - 99 mg/dL   BUN 13 6 - 24 mg/dL   Creatinine, Ser 0.92 0.76 - 1.27 mg/dL   GFR calc non Af Amer 97 >59 mL/min/1.73   GFR calc Af Amer 113 >59 mL/min/1.73   BUN/Creatinine Ratio 14 9 - 20   Sodium 139 134 - 144 mmol/L   Potassium 4.5 3.5 - 5.2 mmol/L   Chloride 101 96 - 106 mmol/L   CO2 25 20 - 29 mmol/L   Calcium 9.2 8.7 - 10.2 mg/dL   Total Protein 6.4 6.0 - 8.5 g/dL   Albumin 4.2 4.0 - 5.0 g/dL   Globulin, Total 2.2 1.5 - 4.5 g/dL   Albumin/Globulin Ratio 1.9 1.2 - 2.2   Bilirubin Total 0.5 0.0 - 1.2 mg/dL   Alkaline Phosphatase 54 39 - 117 IU/L   AST 30 0 - 40 IU/L   ALT 28 0 - 44 IU/L  Lipid panel  Result Value Ref Range   Cholesterol, Total 122 100 - 199  mg/dL   Triglycerides 72 0 - 149 mg/dL   HDL 50 >39 mg/dL   VLDL Cholesterol Cal 14 5 - 40 mg/dL   LDL Calculated 58 0 - 99 mg/dL   Chol/HDL Ratio 2.4 0.0 - 5.0 ratio  Bayer DCA Hb A1c Waived  Result Value Ref Range   HB A1C (BAYER DCA - WAIVED) 6.0 <7.0 %      Assessment & Plan:   1. Neuropathy - CBC with Differential/Platelet - CMP14+EGFR - Lipid panel - Bayer DCA Hb A1c Waived - gabapentin (NEURONTIN) 100 MG capsule; Take 1 capsule (100 mg total) by mouth 2 (two) times daily.  Dispense: 60 capsule; Refill: 5  2. Onychomycosis - CMP14+EGFR - Ambulatory referral to Podiatry  3. DDD (degenerative disc disease), lumbar - gabapentin (NEURONTIN) 100 MG capsule; Take 1 capsule (100 mg total) by mouth 2 (two) times daily.  Dispense: 60 capsule; Refill: 5  4. Severe major depression ([place])  5. Hypothyroidism, unspecified type - CBC with Differential/Platelet - CMP14+EGFR - Lipid panel - Bayer DCA Hb A1c Waived  6. Well adult exam - CBC with Differential/Platelet - CMP14+EGFR - Lipid panel - Bayer DCA Hb A1c Waived   Continue all other maintenance medications as listed above.  Follow up plan: Return in about 4 months (around 05/06/2019).  Educational handout given for [place] PA-C [place]  [address]  [place],  [address] [phone]   01/07/2019, 8:34 PM

## 2019-01-14 DIAGNOSIS — F1120 Opioid dependence, uncomplicated: Secondary | ICD-10-CM | POA: Diagnosis not present

## 2019-01-14 DIAGNOSIS — G894 Chronic pain syndrome: Secondary | ICD-10-CM | POA: Diagnosis not present

## 2019-01-14 DIAGNOSIS — Z79899 Other long term (current) drug therapy: Secondary | ICD-10-CM | POA: Diagnosis not present

## 2019-01-14 DIAGNOSIS — M5136 Other intervertebral disc degeneration, lumbar region: Secondary | ICD-10-CM | POA: Diagnosis not present

## 2019-02-09 DIAGNOSIS — M1990 Unspecified osteoarthritis, unspecified site: Secondary | ICD-10-CM | POA: Diagnosis not present

## 2019-02-09 DIAGNOSIS — G894 Chronic pain syndrome: Secondary | ICD-10-CM | POA: Diagnosis not present

## 2019-02-09 DIAGNOSIS — Z79899 Other long term (current) drug therapy: Secondary | ICD-10-CM | POA: Diagnosis not present

## 2019-02-11 DIAGNOSIS — B351 Tinea unguium: Secondary | ICD-10-CM | POA: Diagnosis not present

## 2019-02-11 DIAGNOSIS — M79676 Pain in unspecified toe(s): Secondary | ICD-10-CM | POA: Diagnosis not present

## 2019-03-04 DIAGNOSIS — F3181 Bipolar II disorder: Secondary | ICD-10-CM | POA: Diagnosis not present

## 2019-03-04 DIAGNOSIS — F3132 Bipolar disorder, current episode depressed, moderate: Secondary | ICD-10-CM | POA: Diagnosis not present

## 2019-03-04 DIAGNOSIS — F410 Panic disorder [episodic paroxysmal anxiety] without agoraphobia: Secondary | ICD-10-CM | POA: Diagnosis not present

## 2019-04-15 DIAGNOSIS — F410 Panic disorder [episodic paroxysmal anxiety] without agoraphobia: Secondary | ICD-10-CM | POA: Diagnosis not present

## 2019-04-15 DIAGNOSIS — F3111 Bipolar disorder, current episode manic without psychotic features, mild: Secondary | ICD-10-CM | POA: Diagnosis not present

## 2019-04-15 DIAGNOSIS — F3131 Bipolar disorder, current episode depressed, mild: Secondary | ICD-10-CM | POA: Diagnosis not present

## 2019-05-11 ENCOUNTER — Other Ambulatory Visit: Payer: Self-pay

## 2019-05-11 ENCOUNTER — Encounter: Payer: Self-pay | Admitting: Physician Assistant

## 2019-05-11 ENCOUNTER — Ambulatory Visit (INDEPENDENT_AMBULATORY_CARE_PROVIDER_SITE_OTHER): Payer: BLUE CROSS/BLUE SHIELD | Admitting: Physician Assistant

## 2019-05-11 DIAGNOSIS — E039 Hypothyroidism, unspecified: Secondary | ICD-10-CM

## 2019-05-11 DIAGNOSIS — M5136 Other intervertebral disc degeneration, lumbar region: Secondary | ICD-10-CM

## 2019-05-11 DIAGNOSIS — G629 Polyneuropathy, unspecified: Secondary | ICD-10-CM | POA: Diagnosis not present

## 2019-05-11 MED ORDER — LEVOTHYROXINE SODIUM 100 MCG PO TABS: ORAL_TABLET | ORAL | 5 refills | Status: DC

## 2019-05-11 MED ORDER — GABAPENTIN 300 MG PO CAPS: 300.0000 mg | ORAL_CAPSULE | Freq: Every day | ORAL | 5 refills | Status: DC

## 2019-05-11 MED ORDER — CYCLOBENZAPRINE HCL 10 MG PO TABS: 10.0000 mg | ORAL_TABLET | Freq: Three times a day (TID) | ORAL | 1 refills | Status: DC | PRN

## 2019-05-11 NOTE — Progress Notes (Signed)
Telephone visit  Subjective: KG:URKYHCW chronic conditions PCP: Terald Sleeper, PA-C CBJ:[id number] Sek is a 48 y.o. male calls for telephone consult today. Patient provides verbal consent for consult held via phone.  Patient is identified with 2 separate identifiers.  At this time the entire area is on COVID-19 social distancing and stay home orders are in place.  Patient is of higher risk and therefore we are performing this by a virtual method.  Location of patient: home Location of provider: HOME Others present for call: no  This patient is for chronic follow-up on his medical conditions which do include degenerative disc disease, hypothyroidism, neuropathy.  He is still working with psychiatry for his manic depression.  They had made a change in his medications of increasing the Celexa.  He states that he is feeling a little bit manic.  His sleep has been a little bit disturbed.  He will be talking to a psychiatrist again.  He is having not too bad of the issue with the lumbar pain but when it comes he will have radiating pain down his leg.  He has seen Dr. ramus in the past.  He tried to go to pain management and the medications did not help at all.  So he just discontinues them.  So at this time we will refill his chronic medications.  And we will have him come in for labs in the near future.  He will probably need an order performed through [place] for his [facility] lab order.  He will bring it when he comes.   ROS: Per HPI  Allergies  Allergen Reactions  . Aripiprazole Anaphylaxis and Swelling    "slurred speech"   . Cariprazine Hcl Other (See Comments)  . Latuda  [Lurasidone Hcl] Other (See Comments)  . Seroquel [Quetiapine Fumarate]     tardive dyskinesia  . Alcohol Itching    Drinking alcohol  . Cariprazine Other (See Comments)    Tardive dyskenesia  . Hydrocodone-Acetaminophen Itching  . Lurasidone Other (See Comments)    dysconesia  . Victoza [Liraglutide] Other  (See Comments)    Severe heartburn   . Victoza [Liraglutide]     Severe heartburn  . Bupropion Rash  . Risperidone Rash   Past Medical History:  Diagnosis Date  . Anxiety   . Bipolar disorder ([place])   . CTS (carpal tunnel syndrome)   . Depression   . GERD (gastroesophageal reflux disease)   . H/O bariatric surgery   . Head injury 05/06/2017  . Hypothyroidism   . Hypothyroidism   . Sleep apnea    no need for CPAP at this time  . Sleep apnea     Current Outpatient Medications:  .  ALPRAZolam (XANAX) 1 MG tablet, Take 1 mg by mouth 4 (four) times daily as needed for anxiety., Disp: , Rfl:  .  Calcium Carbonate-Vit D-Min (CALCIUM 1200 PO), Take by mouth 2 (two) times daily. , Disp: , Rfl:  .  Carbonyl Iron 15 MG CHEW, Chew by mouth daily. , Disp: , Rfl:  .  cyclobenzaprine (FLEXERIL) 10 MG tablet, Take 1 tablet (10 mg total) by mouth 3 (three) times daily as needed for muscle spasms., Disp: 90 tablet, Rfl: 1 .  divalproex (DEPAKOTE) 500 MG DR tablet, Take 500 mg by mouth 5 (five) times daily., Disp: , Rfl:  .  gabapentin (NEURONTIN) 300 MG capsule, Take 1-2 capsules (300-600 mg total) by mouth at bedtime., Disp: 60 capsule, Rfl: 5 .  levothyroxine (SYNTHROID) 100 MCG tablet, TAKE ONE (1) TABLET EACH DAY, Disp: 90 tablet, Rfl: 5 .  lithium 300 MG tablet, Take 300 mg by mouth daily., Disp: , Rfl:  .  lubiprostone (AMITIZA) 24 MCG capsule, TAKE 1 CAPSULE TWICE DAILY WITH A MEAL, Disp: 60 capsule, Rfl: 11 .  Multiple Vitamin (MULTIVITAMIN WITH MINERALS) TABS tablet, Take 1 tablet by mouth 2 (two) times daily. For Vitamin supplementation, Disp: , Rfl:  .  omeprazole (PRILOSEC) 40 MG capsule, Take 1 capsule (40 mg total) by mouth 2 (two) times daily. TAKE ONE (1) CAPSULE EACH DAY, Disp: 60 capsule, Rfl: 11 .  promethazine (PHENERGAN) 25 MG tablet, TAKE 1 TABLET EVERY 6 HOURS AS NEEDED FOR NAUSEA AND VOMITING, Disp: 60 tablet, Rfl: 1 .  vitamin B-12 (CYANOCOBALAMIN) 250 MCG tablet, Take 250  mcg by mouth daily., Disp: , Rfl:  .  zolpidem (AMBIEN) 10 MG tablet, Take 10 mg by mouth at bedtime., Disp: , Rfl:   Assessment/ Plan: 48 y.o. male   1. Neuropathy - cyclobenzaprine (FLEXERIL) 10 MG tablet; Take 1 tablet (10 mg total) by mouth 3 (three) times daily as needed for muscle spasms.  Dispense: 90 tablet; Refill: 1 - gabapentin (NEURONTIN) 300 MG capsule; Take 1-2 capsules (300-600 mg total) by mouth at bedtime.  Dispense: 60 capsule; Refill: 5  2. DDD (degenerative disc disease), lumbar - cyclobenzaprine (FLEXERIL) 10 MG tablet; Take 1 tablet (10 mg total) by mouth 3 (three) times daily as needed for muscle spasms.  Dispense: 90 tablet; Refill: 1 - gabapentin (NEURONTIN) 300 MG capsule; Take 1-2 capsules (300-600 mg total) by mouth at bedtime.  Dispense: 60 capsule; Refill: 5  3. Hypothyroidism, unspecified type - levothyroxine (SYNTHROID) 100 MCG tablet; TAKE ONE (1) TABLET EACH DAY  Dispense: 90 tablet; Refill: 5   Start time: 7:57 AM End time: 8: 19 AM  Meds ordered this encounter  Medications  . cyclobenzaprine (FLEXERIL) 10 MG tablet    Sig: Take 1 tablet (10 mg total) by mouth 3 (three) times daily as needed for muscle spasms.    Dispense:  90 tablet    Refill:  1    Order Specific Question:   Supervising Provider    Answer:   Janora Norlander [[id number]]  . gabapentin (NEURONTIN) 300 MG capsule    Sig: Take 1-2 capsules (300-600 mg total) by mouth at bedtime.    Dispense:  60 capsule    Refill:  5    Order Specific Question:   Supervising Provider    Answer:   Janora Norlander [[id number]]  . levothyroxine (SYNTHROID) 100 MCG tablet    Sig: TAKE ONE (1) TABLET EACH DAY    Dispense:  90 tablet    Refill:  5    Order Specific Question:   Supervising Provider    Answer:   Janora Norlander [[id number]]    Particia Nearing PA-C [place] [phone]

## 2019-05-13 ENCOUNTER — Ambulatory Visit: Payer: Self-pay | Admitting: Neurology

## 2019-06-01 DIAGNOSIS — F331 Major depressive disorder, recurrent, moderate: Secondary | ICD-10-CM | POA: Diagnosis not present

## 2019-06-01 DIAGNOSIS — F4310 Post-traumatic stress disorder, unspecified: Secondary | ICD-10-CM | POA: Diagnosis not present

## 2019-06-01 DIAGNOSIS — F341 Dysthymic disorder: Secondary | ICD-10-CM | POA: Diagnosis not present

## 2019-06-01 DIAGNOSIS — F410 Panic disorder [episodic paroxysmal anxiety] without agoraphobia: Secondary | ICD-10-CM | POA: Diagnosis not present

## 2019-06-14 ENCOUNTER — Telehealth: Payer: Self-pay | Admitting: *Deleted

## 2019-06-14 ENCOUNTER — Encounter (HOSPITAL_COMMUNITY): Payer: Self-pay | Admitting: Emergency Medicine

## 2019-06-14 ENCOUNTER — Other Ambulatory Visit: Payer: Self-pay

## 2019-06-14 ENCOUNTER — Emergency Department (HOSPITAL_COMMUNITY): Payer: BLUE CROSS/BLUE SHIELD

## 2019-06-14 ENCOUNTER — Emergency Department (HOSPITAL_COMMUNITY)
Admission: EM | Admit: 2019-06-14 | Discharge: 2019-06-14 | Disposition: A | Discharge status: Home / Self Care | Payer: BLUE CROSS/BLUE SHIELD | Attending: Emergency Medicine | Admitting: Emergency Medicine

## 2019-06-14 DIAGNOSIS — S060X0A Concussion without loss of consciousness, initial encounter: Secondary | ICD-10-CM | POA: Diagnosis not present

## 2019-06-14 DIAGNOSIS — Y9389 Activity, other specified: Secondary | ICD-10-CM | POA: Insufficient documentation

## 2019-06-14 DIAGNOSIS — Y929 Unspecified place or not applicable: Secondary | ICD-10-CM | POA: Diagnosis not present

## 2019-06-14 DIAGNOSIS — Z885 Allergy status to narcotic agent status: Secondary | ICD-10-CM | POA: Insufficient documentation

## 2019-06-14 DIAGNOSIS — Y999 Unspecified external cause status: Secondary | ICD-10-CM | POA: Insufficient documentation

## 2019-06-14 DIAGNOSIS — M545 Low back pain, unspecified: Secondary | ICD-10-CM

## 2019-06-14 DIAGNOSIS — G8929 Other chronic pain: Secondary | ICD-10-CM | POA: Insufficient documentation

## 2019-06-14 DIAGNOSIS — E039 Hypothyroidism, unspecified: Secondary | ICD-10-CM | POA: Diagnosis not present

## 2019-06-14 DIAGNOSIS — W010XXA Fall on same level from slipping, tripping and stumbling without subsequent striking against object, initial encounter: Secondary | ICD-10-CM | POA: Insufficient documentation

## 2019-06-14 DIAGNOSIS — M549 Dorsalgia, unspecified: Secondary | ICD-10-CM | POA: Diagnosis not present

## 2019-06-14 DIAGNOSIS — Z79899 Other long term (current) drug therapy: Secondary | ICD-10-CM | POA: Insufficient documentation

## 2019-06-14 DIAGNOSIS — Z888 Allergy status to other drugs, medicaments and biological substances status: Secondary | ICD-10-CM | POA: Diagnosis not present

## 2019-06-14 DIAGNOSIS — W19XXXA Unspecified fall, initial encounter: Secondary | ICD-10-CM

## 2019-06-14 DIAGNOSIS — M542 Cervicalgia: Secondary | ICD-10-CM

## 2019-06-14 DIAGNOSIS — R42 Dizziness and giddiness: Secondary | ICD-10-CM | POA: Diagnosis not present

## 2019-06-14 DIAGNOSIS — M533 Sacrococcygeal disorders, not elsewhere classified: Secondary | ICD-10-CM | POA: Diagnosis not present

## 2019-06-14 MED ORDER — CYCLOBENZAPRINE HCL 10 MG PO TABS
10.0000 mg | ORAL_TABLET | Freq: Once | ORAL | Status: AC
  Administered 2019-06-14: 10 mg via ORAL
  Filled 2019-06-14: qty 1

## 2019-06-14 NOTE — ED Triage Notes (Signed)
Pt was mowing on Saturday.  Pt bent down that day and stated" I couldn't get up, I had to try three times to get up and I have been off balance since.  My neck and my butt hurts too".  Pt denies hitting head or falling.

## 2019-06-14 NOTE — Discharge Instructions (Addendum)
You have been seen today for back pain and neck pain. Please read and follow all provided instructions.   1. Medications: continue usual home medications 2. Treatment: rest, drink plenty of fluids 3. Follow Up: Please follow up with your primary doctor in 2 days for discussion of your diagnoses and further evaluation after today's visit; if you do not have a primary care doctor use the resource guide provided to find one; Please return to the ER for any new or worsening symptoms. Please obtain all of your results from medical records or have your doctors office obtain the results - share them with your doctor - you should be seen at your doctors office. Call today to arrange your follow up.   Take medications as prescribed. Please review all of the medicines and only take them if you do not have an allergy to them. Return to the emergency room for worsening condition or new concerning symptoms. Follow up with your regular doctor. If you don't have a regular doctor use one of the numbers below to establish a primary care doctor.  It is also a possibility that you have an allergic reaction to any of the medicines that you have been prescribed - Everybody reacts differently to medications and while MOST people have no trouble with most medicines, you may have a reaction such as nausea, vomiting, rash, swelling, shortness of breath. If this is the case, please stop taking the medicine immediately and contact your physician.  ?  You should return to the ER if you develop severe or worsening symptoms.   Emergency Department Resource Guide 1) Find a Doctor and Pay Out of Pocket Although you won't have to find out who is covered by your insurance plan, it is a good idea to ask around and get recommendations. You will then need to call the office and see if the doctor you have chosen will accept you as a new patient and what types of options they offer for patients who are self-pay. Some doctors offer discounts  or will set up payment plans for their patients who do not have insurance, but you will need to ask so you aren't surprised when you get to your appointment.  2) Contact Your Local Health Department Not all health departments have doctors that can see patients for sick visits, but many do, so it is worth a call to see if yours does. If you don't know where your local health department is, you can check in your phone book. The CDC also has a tool to help you locate your state's health department, and many state websites also have listings of all of their local health departments.  3) Find a [facility] If your illness is not likely to be very severe or complicated, you may want to try a walk in clinic. These are popping up all over the country in pharmacies, drugstores, and shopping centers. They're usually staffed by nurse practitioners or physician assistants that have been trained to treat common illnesses and complaints. They're usually fairly quick and inexpensive. However, if you have serious medical issues or chronic medical problems, these are probably not your best option.  No Primary Care Doctor: Call [facility] at  [phone] - they can help you locate a primary care doctor that  accepts your insurance, provides certain services, etc. Physician Referral Service- [phone]  Chronic Pain Problems: Organization         Address  Phone   Notes  [facility]  [phone] Patients need to be referred by their primary care doctor.   Medication Assistance: Organization         Address  Phone   Notes  [facility] Medication [facility], [address] [place], [place] [address] [phone] --Must be a resident of [facility] -- Must have NO insurance coverage whatsoever (no Medicaid/ Medicare, etc.) -- The pt. MUST have a primary care doctor that directs their care regularly and follows them in the community   [facility]  [phone]   [employer]  [phone]    Agencies that provide inexpensive medical care: Organization         Address  Phone   Notes  [place]  [phone]   [facility]    [phone]   [facility] [place], [place] [address] [phone]   [place] [address], [place] [phone]   [facility]    [phone]   [facility]    [phone]   [place] [address] [place] [address], [place] Phone:  [phone], Fax:  [phone] Hours of Operation:  9 am - 6 pm, M-F.  Also accepts Medicaid/Medicare and self-pay.  [facility] for [address], [address], [place] Phone: [phone], Fax: [phone]. Hours of Operation:  8:30 am - 5:30 pm, M-F.  Also accepts Medicaid and self-pay.  [facility] [place] [address] [place] Phone: [phone]   [place], [place], [place] [phone], Ext. [phone] Mondays & Thursdays: 7-9 AM.  First 15 patients are seen on a first come, first serve basis.    [place] Providers:  Organization         Address  Phone   Notes  [facility] [address], [place] [phone] Also accepts self-pay patients.  [facility] [address], [place]  [phone]   [place], [address], [place] [phone]   [facility] [address] [place] [phone]   Lucianne Lei [address], [place]   [phone] Only accepts [place] patients after they have their name applied to their card.   Self-Pay (no insurance) in [facility]:  Organization         Address  Phone   Notes  Sickle Cell Patients, [facility] [place] [phone]   [facility] [place] [phone]   [facility] [place]  [place], [place], [place] [phone]   [facility]/Dr. Osei-Bonsu  [address], [place] [address], [place] [phone] Phone number for both [place] and [place] locations is the same.  [facility] [address] [place] [phone]   [facility] [address] [place] or [address] [phone]   [facility] [address] [place] [phone], phone; [phone], fax Sees patients 1st and 3rd Saturday of every month.  Must not qualify for public or private insurance (i.e. Medicaid, Medicare, [place] Health Choice,  Veterans' Benefits)  Household income should be no more than 200% of the poverty level The clinic cannot treat you if you are pregnant or think you are pregnant  Sexually transmitted diseases are not treated at the clinic.

## 2019-06-14 NOTE — Telephone Encounter (Signed)
Incoming call from pt stating he had fall yesterday Today increased numbness in extremities Difficulty standing up Garbled speech Instructed to go to ER for evaluation

## 2019-06-14 NOTE — ED Provider Notes (Signed)
[facility] EMERGENCY DEPARTMENT Provider Note   CSN: [id number] Arrival date & time: 06/14/19  1415    History   Chief Complaint Chief Complaint  Patient presents with   Back Pain   Neck Pain    HPI Marvin Espinoza is a 48 y.o. male with a PMH of DDD, Bipolar Disorder, Anxiety, GERD, and Depression presenting with constant non radiating neck and back pain onset 3 days ago. Patient describes pain as sharp and states it is worse with movement. Patient states symptoms are worse with standing and ambulation. Patient states he was mowing his lawn when the back pain began. Patient states he fell backwards after working on his lawn machine. Patient is unsure if he hit his head. Patient denies LOC. Patient states he has taken flexeril and gabapentin without relief. Patient reports chronic back pain, but states symptoms appear worse since incident. Patient reports decreased concentration and intermittent headache since the incident. Denies numbness, tingling, weakness, incontinence to bowel/bladder, fever, chills, IV drug use, or hx of cancer. Patient denies syncope, facial asymmetry, or vision changes. Patient denies fever, cough, or rash.      HPI  Past Medical History:  Diagnosis Date   Anxiety    Bipolar disorder ([place])    CTS (carpal tunnel syndrome)    Depression    GERD (gastroesophageal reflux disease)    H/O bariatric surgery    Head injury 05/06/2017   Hypothyroidism    Hypothyroidism    Sleep apnea    no need for CPAP at this time   Sleep apnea     Patient Active Problem List   Diagnosis Date Noted   Onychomycosis 01/05/2019   Neuropathy 01/05/2019   MDD (major depressive disorder), recurrent episode, severe ([place]) 10/04/2018   OSA (obstructive sleep apnea) 09/07/2018   Bilateral hip pain 10/24/2017   Shoulder pain, bilateral 10/24/2017   Arthritis 10/24/2017   Osteoarthritis of both hips 10/24/2017   Avascular necrosis of bone of hip, right ([place])  10/24/2017   Osteoarthritis of shoulder 10/24/2017   PTSD (post-traumatic stress disorder) 08/20/2017   Tardive dyskinesia 08/20/2017   Severe major depression ([place]) 05/10/2017   Affective psychosis, bipolar ([place]) 05/10/2017   Vision disturbance 05/06/2017   History of concussion 02/10/2017   Mood disorder ([place]) 02/10/2017   Adjustment insomnia 01/24/2017   Adjustment disorder with anxious mood 01/24/2017   GAD (generalized anxiety disorder) 01/24/2017   Gastroesophageal reflux disease without esophagitis 11/19/2016   Hypothyroidism 11/19/2016   DDD (degenerative disc disease), lumbar 11/19/2016    Past Surgical History:  Procedure Laterality Date   ANAL FISSURE REPAIR     BARIATRIC SURGERY     CARPAL TUNNEL RELEASE Left 05/09/2015   Procedure: LEFT CARPAL TUNNEL RELEASE;  Surgeon: Daryll Brod, MD;  Location: [facility];  Service: Orthopedics;  Laterality: Left;   CARPAL TUNNEL RELEASE Left 05-09-2015   CARPAL TUNNEL RELEASE Right 06/20/2015   Procedure: RIGHT CARPAL TUNNEL RELEASE;  Surgeon: Daryll Brod, MD;  Location: [facility];  Service: Orthopedics;  Laterality: Right;  REGIONAL/FAB   CHOLECYSTECTOMY     COLONOSCOPY     UPPER GASTROINTESTINAL ENDOSCOPY          Home Medications    Prior to Admission medications   Medication Sig Start Date End Date Taking? Authorizing Provider  ALPRAZolam Duanne Moron) 1 MG tablet Take 1 mg by mouth 4 (four) times daily as needed for anxiety.    [provider]  Calcium Carbonate-Vit D-Min (CALCIUM  1200 PO) Take by mouth 2 (two) times daily.     [provider]  Carbonyl Iron 15 MG CHEW Chew by mouth daily.     [provider]  cyclobenzaprine (FLEXERIL) 10 MG tablet Take 1 tablet (10 mg total) by mouth 3 (three) times daily as needed for muscle spasms. 05/11/19   Terald Sleeper, PA-C  divalproex (DEPAKOTE) 500 MG DR tablet Take 500 mg by mouth 5 (five) times daily.     [provider]  gabapentin (NEURONTIN) 300 MG capsule Take 1-2 capsules (300-600 mg total) by mouth at bedtime. 05/11/19   Terald Sleeper, PA-C  levothyroxine (SYNTHROID) 100 MCG tablet TAKE ONE (1) TABLET EACH DAY 05/11/19   Terald Sleeper, PA-C  lithium 300 MG tablet Take 300 mg by mouth daily. 12/10/18   [provider]  lubiprostone (AMITIZA) 24 MCG capsule TAKE 1 CAPSULE TWICE DAILY WITH A MEAL 09/04/18   Terald Sleeper, PA-C  Multiple Vitamin (MULTIVITAMIN WITH MINERALS) TABS tablet Take 1 tablet by mouth 2 (two) times daily. For Vitamin supplementation 05/15/17   Lindell Spar I, NP  omeprazole (PRILOSEC) 40 MG capsule Take 1 capsule (40 mg total) by mouth 2 (two) times daily. TAKE ONE (1) CAPSULE EACH DAY 11/04/18   Terald Sleeper, PA-C  promethazine (PHENERGAN) 25 MG tablet TAKE 1 TABLET EVERY 6 HOURS AS NEEDED FOR NAUSEA AND VOMITING 11/04/18   Terald Sleeper, PA-C  vitamin B-12 (CYANOCOBALAMIN) 250 MCG tablet Take 250 mcg by mouth daily.    [provider]  zolpidem (AMBIEN) 10 MG tablet Take 10 mg by mouth at bedtime. 12/10/18   [provider]    Family History Family History  Problem Relation Age of Onset   Diabetes Mother    Heart disease Mother    Depression Mother    Esophageal cancer Mother    COPD Father    Heart disease Father    Kidney disease Father    Mental illness Father    Depression Father    Mental illness Sister    Depression Sister    Esophageal cancer Maternal Uncle    Colon cancer Neg Hx    Rectal cancer Neg Hx    Stomach cancer Neg Hx     Social History Social History   Tobacco Use   Smoking status: Never Smoker   Smokeless tobacco: Never Used  Substance Use Topics   Alcohol use: No   Drug use: No     Allergies   Aripiprazole, Cariprazine hcl, Latuda  [lurasidone hcl], Seroquel [quetiapine fumarate], Alcohol, Cariprazine, Hydrocodone-acetaminophen, Lurasidone, Victoza [liraglutide],  Victoza [liraglutide], Bupropion, and Risperidone   Review of Systems Review of Systems  Constitutional: Negative for activity change, chills, diaphoresis, fever and unexpected weight change.  Respiratory: Negative for cough and shortness of breath.   Cardiovascular: Negative for chest pain, palpitations and leg swelling.  Gastrointestinal: Negative for abdominal pain, constipation, diarrhea, nausea and vomiting.  Genitourinary: Negative for difficulty urinating, dysuria, flank pain and hematuria.  Musculoskeletal: Positive for back pain, gait problem and neck pain. Negative for arthralgias, joint swelling, myalgias and neck stiffness.  Skin: Negative for rash.  Allergic/Immunologic: Negative for immunocompromised state.  Neurological: Positive for headaches. Negative for dizziness, tremors, seizures, syncope, facial asymmetry, speech difficulty, weakness, light-headedness and numbness.  Hematological: Does not bruise/bleed easily.  Psychiatric/Behavioral: Positive for decreased concentration. Negative for agitation, behavioral problems, confusion, hallucinations, sleep disturbance and suicidal ideas. The patient is not nervous/anxious.  Physical Exam Updated Vital Signs BP 121/82 (BP Location: Right Arm)    Pulse (!) 58    Temp 99 F (37.2 C) (Oral)    Resp 16    Ht 6\' 10"  (2.083 m)    Wt 108 kg    SpO2 99%    BMI 24.90 kg/m   Physical Exam Vitals signs and nursing note reviewed.  Constitutional:      General: He is not in acute distress.    Appearance: He is well-developed. He is not diaphoretic.  HENT:     Head: Normocephalic and atraumatic.  Neck:     Musculoskeletal: Normal range of motion.  Cardiovascular:     Rate and Rhythm: Normal rate and regular rhythm.     Heart sounds: Normal heart sounds. No murmur. No friction rub. No gallop.   Pulmonary:     Effort: Pulmonary effort is normal. No respiratory distress.     Breath sounds: Normal breath sounds. No wheezing or  rales.  Abdominal:     Palpations: Abdomen is soft.     Tenderness: There is no abdominal tenderness.  Musculoskeletal:     Cervical back: He exhibits decreased range of motion, tenderness and bony tenderness. He exhibits no swelling and no edema.     Thoracic back: He exhibits tenderness. He exhibits normal range of motion, no bony tenderness and no swelling.     Lumbar back: He exhibits decreased range of motion, tenderness and bony tenderness. He exhibits no swelling, no edema and no deformity.     Comments: No skin changes noted on exam. Midline tenderness to palpation of cervical and lumbar spine. Left sided paraspinal tenderness to palpation of thoracic spine. 2+ DP pulses. Sensation intact. 5/5 strength in lower extremities. Patient is able to ambulate, but has discomfort with ambulation.   Skin:    General: Skin is warm.     Findings: No erythema or rash.  Neurological:     Mental Status: He is alert.  Psychiatric:        Mood and Affect: Mood is anxious.    Mental Status:  Alert, oriented, thought content appropriate, able to give a coherent history. Speech fluent without evidence of aphasia. Able to follow 2 step commands without difficulty.  Cranial Nerves:  II:  Peripheral visual fields grossly normal, pupils equal, round, reactive to light III,IV, VI: ptosis not present, extra-ocular motions intact bilaterally  V,VII: smile symmetric, facial light touch sensation equal VIII: hearing grossly normal to voice  IX,X: symmetric elevation of soft palate, uvula elevates symmetrically  XI: bilateral shoulder shrug symmetric and strong XII: midline tongue extension without fassiculations Motor:  Normal tone. 5/5 in upper and lower extremities bilaterally including strong and equal grip strength and dorsiflexion/plantar flexion Sensory: Pinprick and light touch normal in all extremities.  Deep Tendon Reflexes: 2+ and symmetric in the biceps and patella Cerebellar: normal  finger-to-nose with bilateral upper extremities Gait: normal gait and balance. Patient states back pain is present with ambulation.  Negative pronator drift. Negative Romberg sign. CV: distal pulses palpable throughout   ED Treatments / Results  Labs (all labs ordered are listed, but only abnormal results are displayed) Labs Reviewed - No data to display  EKG None  Radiology Dg Thoracic Spine 2 View  Result Date: 06/14/2019 CLINICAL DATA:  Dorsalgia EXAM: THORACIC SPINE 3 VIEWS COMPARISON:  None. FINDINGS: Frontal, lateral, and swimmer's views were obtained. There is no fracture or spondylolisthesis. There is mild disc space narrowing at several  levels. No erosive change or paraspinous lesion. Small anterior and lateral osteophytes noted at several levels. IMPRESSION: Relatively mild osteoarthritic changes several levels. No fracture or spondylolisthesis. Electronically Signed   By: Lowella Grip III M.D.   On: 06/14/2019 16:44   Dg Lumbar Spine Complete  Result Date: 06/14/2019 CLINICAL DATA:  Lumbago with radicular symptoms into the buttocks regions. EXAM: LUMBAR SPINE - COMPLETE 4+ VIEW COMPARISON:  Lumbar MRI September 23, 2017 and lumbar radiographs March 29, 2015 FINDINGS: Frontal, lateral, spot lumbosacral lateral, and bilateral oblique views were obtained. There are 5 non-rib-bearing lumbar type vertebral bodies. There is moderate disc space narrowing at L5-S1, stable. Other disc spaces appear unremarkable. There is facet osteoarthritic change at L5-S1 bilaterally. Facets otherwise appear unremarkable. IMPRESSION: Osteoarthritic change at L5-S1, similar to prior studies. No fracture or spondylolisthesis. Electronically Signed   By: Lowella Grip III M.D.   On: 06/14/2019 16:46   Dg Sacrum/coccyx  Result Date: 06/14/2019 CLINICAL DATA:  Pain EXAM: SACRUM AND COCCYX - 2+ VIEW COMPARISON:  None. FINDINGS: Frontal, tilt frontal, and lateral views were obtained. There is no evident  fracture or diastasis. Joint spaces appear unremarkable. No evidence sacroiliitis. IMPRESSION: No fracture or diastasis evident.  No demonstrable arthropathy. Electronically Signed   By: Lowella Grip III M.D.   On: 06/14/2019 16:47   Ct Head Wo Contrast  Result Date: 06/14/2019 CLINICAL DATA:  Dizziness. EXAM: CT HEAD WITHOUT CONTRAST CT CERVICAL SPINE WITHOUT CONTRAST TECHNIQUE: Multidetector CT imaging of the head and cervical spine was performed following the standard protocol without intravenous contrast. Multiplanar CT image reconstructions of the cervical spine were also generated. COMPARISON:  MRI brain 09/20/2018 FINDINGS: CT HEAD FINDINGS Brain: The ventricles are normal in size and configuration. No extra-axial fluid collections are identified. The gray-white differentiation is maintained. No CT findings for acute hemispheric infarction or intracranial hemorrhage. No mass lesions. The brainstem and cerebellum are normal. Vascular: No hyperdense vessels or obvious aneurysm. Skull: No acute skull fracture.  No bone lesion. Sinuses/Orbits: The paranasal sinuses and mastoid air cells are clear. The globes are intact. Other: No scalp lesions, laceration or hematoma. CT CERVICAL SPINE FINDINGS Alignment: Normal Skull base and vertebrae: No acute fracture. No primary bone lesion or focal pathologic process. Soft tissues and spinal canal: No prevertebral fluid or swelling. No visible canal hematoma. Disc levels: The spinal canal is generous. No spinal or foraminal stenosis. Upper chest: The lung apices are clear. Other: No neck mass or adenopathy. IMPRESSION: 1. No acute intracranial findings or mass lesions. 2. Normal cervical spine CT scan. Electronically Signed   By: Marijo Sanes M.D.   On: 06/14/2019 16:21   Ct Cervical Spine Wo Contrast  Result Date: 06/14/2019 CLINICAL DATA:  Dizziness. EXAM: CT HEAD WITHOUT CONTRAST CT CERVICAL SPINE WITHOUT CONTRAST TECHNIQUE: Multidetector CT imaging of the  head and cervical spine was performed following the standard protocol without intravenous contrast. Multiplanar CT image reconstructions of the cervical spine were also generated. COMPARISON:  MRI brain 09/20/2018 FINDINGS: CT HEAD FINDINGS Brain: The ventricles are normal in size and configuration. No extra-axial fluid collections are identified. The gray-white differentiation is maintained. No CT findings for acute hemispheric infarction or intracranial hemorrhage. No mass lesions. The brainstem and cerebellum are normal. Vascular: No hyperdense vessels or obvious aneurysm. Skull: No acute skull fracture.  No bone lesion. Sinuses/Orbits: The paranasal sinuses and mastoid air cells are clear. The globes are intact. Other: No scalp lesions, laceration or hematoma. CT CERVICAL SPINE  FINDINGS Alignment: Normal Skull base and vertebrae: No acute fracture. No primary bone lesion or focal pathologic process. Soft tissues and spinal canal: No prevertebral fluid or swelling. No visible canal hematoma. Disc levels: The spinal canal is generous. No spinal or foraminal stenosis. Upper chest: The lung apices are clear. Other: No neck mass or adenopathy. IMPRESSION: 1. No acute intracranial findings or mass lesions. 2. Normal cervical spine CT scan. Electronically Signed   By: Marijo Sanes M.D.   On: 06/14/2019 16:21    Procedures Procedures (including critical care time)  Medications Ordered in ED Medications  cyclobenzaprine (FLEXERIL) tablet 10 mg (10 mg Oral Given 06/14/19 1713)     Initial Impression / Assessment and Plan / ED Course  I have reviewed the triage vital signs and the nursing notes.  Pertinent labs & imaging results that were available during my care of the patient were reviewed by me and considered in my medical decision making (see chart for details).  Clinical Course as of Jun 13 1730  Mon Jun 14, 2019  1626 1. No acute intracranial findings or mass lesions. 2. Normal cervical spine CT  scan.    CT Cervical Spine Wo Contrast [AH]  1648 Relatively mild osteoarthritic changes several levels. No fracture or spondylolisthesis.    DG Thoracic Spine 2 View [AH]  1655 No fracture or diastasis evident.  No demonstrable arthropathy.  DG Sacrum/Coccyx [AH]  1656 Osteoarthritic change at L5-S1, similar to prior studies. No fracture or spondylolisthesis.    DG Lumbar Spine Complete [AH]  1656 Relatively mild osteoarthritic changes several levels. No fracture or spondylolisthesis.    DG Thoracic Spine 2 View [AH]    Clinical Course User Index [AH] Arville Lime, PA-C      Patient with back pain and neck pain.  No neurological deficits and normal neuro exam.  Patient can walk but states is painful.  No loss of bowel or bladder control.  No concern for cauda equina.  No fever, night sweats, weight loss, h/o cancer, IVDU. Patient has chronic back pain and has tried multiple treatments. Patient requested to only have his typical medication (flexeril) while in the ER. Patient requested not to be prescribed any additional medications at this time. Discussed return precautions with patient. Advised patient to follow up with PCP. RICE protocol and pain medicine indicated and discussed with patient.   Patient with a possible head injury which did not cause of loss of consciousness but with an intermittent mild headache since the initial trauma.  No evidence of skull fracture on physical exam. Patient is not taking anticoagulants, is less than 65 and has no history of subarachnoid or subdural hemorrhage. Patient denies nausea, vomiting, amnesia, vision changes, and vertigo.  Patient with no focal neurological deficits on physical exam. Discussed the likely etiology of patient's symptoms being concussive in nature.  Discussed the risk versus benefit of CT scan, but patient preferred a CT head. CT head is negative. Discussed thoroughly symptoms to return to the emergency department including  severe headaches, disequilibrium, vomiting, double vision, extremity weakness, difficulty ambulating, or any other concerning symptoms.   Patient will be discharged with information pertaining to diagnosis and advised to use over-the-counter medications like Tylenol for pain relief. Pt has also advised to not participate in contact sports until they are completely asymptomatic for at least 1 week or they are cleared by their doctor.   Final Clinical Impressions(s) / ED Diagnoses   Final diagnoses:  Neck pain  Acute midline low back pain without sciatica  Fall, initial encounter  Concussion without loss of consciousness, initial encounter    ED Discharge Orders    None       Arville Lime, [place] 06/14/19 1732    Daleen Bo, MD 06/15/19 [id number]

## 2019-06-14 NOTE — ED Notes (Signed)
ED Provider at bedside. 

## 2019-06-14 NOTE — ED Notes (Signed)
Came out of room to find patient had left without discharge paperwork.

## 2019-06-14 NOTE — ED Notes (Signed)
Pt returned from radiology.

## 2019-06-14 NOTE — ED Notes (Signed)
Patient transported to X-ray 

## 2019-07-10 ENCOUNTER — Other Ambulatory Visit: Payer: Self-pay | Admitting: Physician Assistant

## 2019-07-10 DIAGNOSIS — G629 Polyneuropathy, unspecified: Secondary | ICD-10-CM

## 2019-07-10 DIAGNOSIS — M5136 Other intervertebral disc degeneration, lumbar region: Secondary | ICD-10-CM

## 2019-07-20 DIAGNOSIS — F3342 Major depressive disorder, recurrent, in full remission: Secondary | ICD-10-CM | POA: Diagnosis not present

## 2019-07-20 DIAGNOSIS — F410 Panic disorder [episodic paroxysmal anxiety] without agoraphobia: Secondary | ICD-10-CM | POA: Diagnosis not present

## 2019-07-20 DIAGNOSIS — F4322 Adjustment disorder with anxiety: Secondary | ICD-10-CM | POA: Diagnosis not present

## 2019-07-27 DIAGNOSIS — B351 Tinea unguium: Secondary | ICD-10-CM | POA: Diagnosis not present

## 2019-07-27 DIAGNOSIS — M79676 Pain in unspecified toe(s): Secondary | ICD-10-CM | POA: Diagnosis not present

## 2019-08-05 DIAGNOSIS — R635 Abnormal weight gain: Secondary | ICD-10-CM | POA: Diagnosis not present

## 2019-08-05 DIAGNOSIS — Z9884 Bariatric surgery status: Secondary | ICD-10-CM | POA: Diagnosis not present

## 2019-08-05 DIAGNOSIS — Z713 Dietary counseling and surveillance: Secondary | ICD-10-CM | POA: Diagnosis not present

## 2019-08-05 DIAGNOSIS — E569 Vitamin deficiency, unspecified: Secondary | ICD-10-CM | POA: Diagnosis not present

## 2019-08-08 ENCOUNTER — Other Ambulatory Visit: Payer: Self-pay | Admitting: Physician Assistant

## 2019-08-08 DIAGNOSIS — Z9884 Bariatric surgery status: Secondary | ICD-10-CM

## 2019-08-08 DIAGNOSIS — Z0000 Encounter for general adult medical examination without abnormal findings: Secondary | ICD-10-CM

## 2019-08-08 DIAGNOSIS — E039 Hypothyroidism, unspecified: Secondary | ICD-10-CM

## 2019-08-11 ENCOUNTER — Ambulatory Visit (INDEPENDENT_AMBULATORY_CARE_PROVIDER_SITE_OTHER): Payer: BLUE CROSS/BLUE SHIELD | Admitting: Physician Assistant

## 2019-08-11 ENCOUNTER — Encounter: Payer: Self-pay | Admitting: Physician Assistant

## 2019-08-11 DIAGNOSIS — K219 Gastro-esophageal reflux disease without esophagitis: Secondary | ICD-10-CM

## 2019-08-11 DIAGNOSIS — G629 Polyneuropathy, unspecified: Secondary | ICD-10-CM | POA: Diagnosis not present

## 2019-08-11 DIAGNOSIS — M5136 Other intervertebral disc degeneration, lumbar region: Secondary | ICD-10-CM

## 2019-08-11 DIAGNOSIS — M51369 Other intervertebral disc degeneration, lumbar region without mention of lumbar back pain or lower extremity pain: Secondary | ICD-10-CM

## 2019-08-11 MED ORDER — OMEPRAZOLE 40 MG PO CPDR: 40.0000 mg | DELAYED_RELEASE_CAPSULE | Freq: Two times a day (BID) | ORAL | 11 refills | Status: DC

## 2019-08-11 MED ORDER — CYCLOBENZAPRINE HCL 10 MG PO TABS: 10.0000 mg | ORAL_TABLET | Freq: Three times a day (TID) | ORAL | 5 refills | Status: DC | PRN

## 2019-08-11 NOTE — Progress Notes (Signed)
Telephone visit  Subjective: [id number] conditions PCP: Terald Sleeper, PA-C [id number] [place] is a 48 y.o. male calls for telephone consult today. Patient provides verbal consent for consult held via phone.  Patient is identified with 2 separate identifiers.  At this time the entire area is on COVID-19 social distancing and stay home orders are in place.  Patient is of higher risk and therefore we are performing this by a virtual method.  Location of patient: home Location of provider: HOME Others present for call: no  This patient is having a chronic follow-up for his medical conditions.  They do include GERD, neuropathy, degenerative disc disease, diabetes, hypothyroidism.  A lab order will be placed for him to come in the near future.  He is currently helping take care of his mother who is battling cancer again.  He is not able to get into the bariatric clinic because of the COVID restrictions.  They wanted him to have labs performed to and we will draw labs that have been performed in the past for them.  I have gone back and reviewed the chart.  Refills will be sent as needed.  He states overall he feels quite stable at this time.   ROS: Per HPI  Allergies  Allergen Reactions  . Aripiprazole Anaphylaxis and Swelling    "slurred speech"   . Cariprazine Hcl Other (See Comments)  . Latuda  [Lurasidone Hcl] Other (See Comments)  . Seroquel [Quetiapine Fumarate]     tardive dyskinesia  . Alcohol Itching    Drinking alcohol  . Cariprazine Other (See Comments)    Tardive dyskenesia  . Hydrocodone-Acetaminophen Itching  . Lurasidone Other (See Comments)    dysconesia  . Victoza [Liraglutide] Other (See Comments)    Severe heartburn   . Victoza [Liraglutide]     Severe heartburn  . Bupropion Rash  . Risperidone Rash   Past Medical History:  Diagnosis Date  . Anxiety   . Bipolar disorder ([place])   . CTS (carpal tunnel syndrome)   . Depression   . GERD (gastroesophageal  reflux disease)   . H/O bariatric surgery   . Head injury 05/06/2017  . Hypothyroidism   . Hypothyroidism   . Sleep apnea    no need for CPAP at this time  . Sleep apnea     Current Outpatient Medications:  .  ALPRAZolam (XANAX) 1 MG tablet, Take 1 mg by mouth 4 (four) times daily as needed for anxiety., Disp: , Rfl:  .  Calcium Carbonate-Vit D-Min (CALCIUM 1200 PO), Take by mouth 2 (two) times daily. , Disp: , Rfl:  .  Carbonyl Iron 15 MG CHEW, Chew by mouth daily. , Disp: , Rfl:  .  cyclobenzaprine (FLEXERIL) 10 MG tablet, Take 1 tablet (10 mg total) by mouth 3 (three) times daily as needed. for muscle spams, Disp: 90 tablet, Rfl: 5 .  divalproex (DEPAKOTE) 500 MG DR tablet, Take 500 mg by mouth 5 (five) times daily., Disp: , Rfl:  .  gabapentin (NEURONTIN) 300 MG capsule, Take 1-2 capsules (300-600 mg total) by mouth at bedtime., Disp: 60 capsule, Rfl: 5 .  levothyroxine (SYNTHROID) 100 MCG tablet, TAKE ONE (1) TABLET EACH DAY, Disp: 90 tablet, Rfl: 5 .  lithium 300 MG tablet, Take 300 mg by mouth daily., Disp: , Rfl:  .  lubiprostone (AMITIZA) 24 MCG capsule, TAKE 1 CAPSULE TWICE DAILY WITH A MEAL, Disp: 60 capsule, Rfl: 11 .  Multiple Vitamin (MULTIVITAMIN WITH MINERALS) TABS  tablet, Take 1 tablet by mouth 2 (two) times daily. For Vitamin supplementation, Disp: , Rfl:  .  omeprazole (PRILOSEC) 40 MG capsule, Take 1 capsule (40 mg total) by mouth 2 (two) times daily. TAKE ONE (1) CAPSULE EACH DAY, Disp: 60 capsule, Rfl: 11 .  promethazine (PHENERGAN) 25 MG tablet, TAKE 1 TABLET EVERY 6 HOURS AS NEEDED FOR NAUSEA AND VOMITING, Disp: 60 tablet, Rfl: 1 .  vitamin B-12 (CYANOCOBALAMIN) 250 MCG tablet, Take 250 mcg by mouth daily., Disp: , Rfl:  .  zolpidem (AMBIEN) 10 MG tablet, Take 10 mg by mouth at bedtime., Disp: , Rfl:   Assessment/ Plan: 48 y.o. male   1. Neuropathy - cyclobenzaprine (FLEXERIL) 10 MG tablet; Take 1 tablet (10 mg total) by mouth 3 (three) times daily as needed. for  muscle spams  Dispense: 90 tablet; Refill: 5  2. DDD (degenerative disc disease), lumbar - cyclobenzaprine (FLEXERIL) 10 MG tablet; Take 1 tablet (10 mg total) by mouth 3 (three) times daily as needed. for muscle spams  Dispense: 90 tablet; Refill: 5  3. Gastroesophageal reflux disease without esophagitis - omeprazole (PRILOSEC) 40 MG capsule; Take 1 capsule (40 mg total) by mouth 2 (two) times daily. TAKE ONE (1) CAPSULE EACH DAY  Dispense: 60 capsule; Refill: 11   No follow-ups on file.  Continue all other maintenance medications as listed above.  Start time: 7:57 AM End time: 8:08 AM  Meds ordered this encounter  Medications  . cyclobenzaprine (FLEXERIL) 10 MG tablet    Sig: Take 1 tablet (10 mg total) by mouth 3 (three) times daily as needed. for muscle spams    Dispense:  90 tablet    Refill:  5    Order Specific Question:   Supervising Provider    Answer:   Janora Norlander [id number]  . omeprazole (PRILOSEC) 40 MG capsule    Sig: Take 1 capsule (40 mg total) by mouth 2 (two) times daily. TAKE ONE (1) CAPSULE EACH DAY    Dispense:  60 capsule    Refill:  11    Order Specific Question:   Supervising Provider    Answer:   Janora Norlander [id number]    Particia Nearing PA-C [place] [phone]

## 2019-08-20 ENCOUNTER — Other Ambulatory Visit: Payer: Self-pay

## 2019-08-20 ENCOUNTER — Other Ambulatory Visit (INDEPENDENT_AMBULATORY_CARE_PROVIDER_SITE_OTHER): Payer: BLUE CROSS/BLUE SHIELD

## 2019-08-20 DIAGNOSIS — Z23 Encounter for immunization: Secondary | ICD-10-CM | POA: Diagnosis not present

## 2019-08-20 DIAGNOSIS — Z0000 Encounter for general adult medical examination without abnormal findings: Secondary | ICD-10-CM

## 2019-08-20 DIAGNOSIS — Z9884 Bariatric surgery status: Secondary | ICD-10-CM

## 2019-08-20 DIAGNOSIS — E039 Hypothyroidism, unspecified: Secondary | ICD-10-CM

## 2019-08-20 LAB — BAYER DCA HB A1C WAIVED: HB A1C (BAYER DCA - WAIVED): 6.5 % (ref <7.0)

## 2019-08-21 LAB — CBC WITH DIFFERENTIAL/PLATELET
Basophils Absolute: 0.1 10*3/uL (ref 0.0–0.2)
Basos: 1 %
EOS (ABSOLUTE): 0.1 10*3/uL (ref 0.0–0.4)
Eos: 2 %
Hematocrit: 40.4 % (ref 37.5–51.0)
Hemoglobin: 13.6 g/dL (ref 13.0–17.7)
Immature Grans (Abs): 0 10*3/uL (ref 0.0–0.1)
Immature Granulocytes: 0 %
Lymphocytes Absolute: 2.4 10*3/uL (ref 0.7–3.1)
Lymphs: 39 %
MCH: 29.8 pg (ref 26.6–33.0)
MCHC: 33.7 g/dL (ref 31.5–35.7)
MCV: 89 fL (ref 79–97)
Monocytes Absolute: 0.4 10*3/uL (ref 0.1–0.9)
Monocytes: 7 %
Neutrophils Absolute: 3.1 10*3/uL (ref 1.4–7.0)
Neutrophils: 51 %
Platelets: 209 10*3/uL (ref 150–450)
RBC: 4.56 x10E6/uL (ref 4.14–5.80)
RDW: 13.5 % (ref 11.6–15.4)
WBC: 6.1 10*3/uL (ref 3.4–10.8)

## 2019-08-21 LAB — CMP14+EGFR
ALT: 22 IU/L (ref 0–44)
AST: 18 IU/L (ref 0–40)
Albumin/Globulin Ratio: 1.8 (ref 1.2–2.2)
Albumin: 4.3 g/dL (ref 4.0–5.0)
Alkaline Phosphatase: 52 IU/L (ref 39–117)
BUN/Creatinine Ratio: 17 (ref 9–20)
BUN: 17 mg/dL (ref 6–24)
Bilirubin Total: 0.3 mg/dL (ref 0.0–1.2)
CO2: 28 mmol/L (ref 20–29)
Calcium: 9.3 mg/dL (ref 8.7–10.2)
Chloride: 101 mmol/L (ref 96–106)
Creatinine, Ser: 1.01 mg/dL (ref 0.76–1.27)
GFR calc Af Amer: 100 mL/min/{1.73_m2} (ref >59)
GFR calc non Af Amer: 87 mL/min/{1.73_m2} (ref >59)
Globulin, Total: 2.4 g/dL (ref 1.5–4.5)
Glucose: 131 mg/dL — ABNORMAL HIGH (ref 65–99)
Potassium: 5.2 mmol/L (ref 3.5–5.2)
Sodium: 141 mmol/L (ref 134–144)
Total Protein: 6.7 g/dL (ref 6.0–8.5)

## 2019-08-21 LAB — VITAMIN D 25 HYDROXY (VIT D DEFICIENCY, FRACTURES): Vit D, 25-Hydroxy: 42.9 ng/mL (ref 30.0–100.0)

## 2019-08-21 LAB — THYROID PANEL WITH TSH
Free Thyroxine Index: 1.9 (ref 1.2–4.9)
T3 Uptake Ratio: 30 % (ref 24–39)
T4, Total: 6.4 ug/dL (ref 4.5–12.0)
TSH: 4.36 u[IU]/mL (ref 0.450–4.500)

## 2019-08-21 LAB — LIPID PANEL
Chol/HDL Ratio: 3.2 ratio (ref 0.0–5.0)
Cholesterol, Total: 133 mg/dL (ref 100–199)
HDL: 41 mg/dL (ref >39)
LDL Chol Calc (NIH): 76 mg/dL (ref 0–99)
Triglycerides: 82 mg/dL (ref 0–149)
VLDL Cholesterol Cal: 16 mg/dL (ref 5–40)

## 2019-08-21 LAB — VITAMIN B12: Vitamin B-12: >2000 pg/mL — ABNORMAL HIGH (ref 232–1245)

## 2019-08-25 ENCOUNTER — Telehealth: Payer: Self-pay | Admitting: Physician Assistant

## 2019-08-25 NOTE — Telephone Encounter (Signed)
Aware of results. 

## 2019-08-31 DIAGNOSIS — F410 Panic disorder [episodic paroxysmal anxiety] without agoraphobia: Secondary | ICD-10-CM | POA: Diagnosis not present

## 2019-08-31 DIAGNOSIS — F3342 Major depressive disorder, recurrent, in full remission: Secondary | ICD-10-CM | POA: Diagnosis not present

## 2019-09-17 ENCOUNTER — Other Ambulatory Visit: Payer: Self-pay | Admitting: Physician Assistant

## 2019-10-14 ENCOUNTER — Other Ambulatory Visit: Payer: Self-pay | Admitting: Physician Assistant

## 2019-10-14 DIAGNOSIS — G629 Polyneuropathy, unspecified: Secondary | ICD-10-CM

## 2019-10-14 DIAGNOSIS — M5136 Other intervertebral disc degeneration, lumbar region: Secondary | ICD-10-CM

## 2019-11-15 DIAGNOSIS — F4321 Adjustment disorder with depressed mood: Secondary | ICD-10-CM | POA: Diagnosis not present

## 2019-11-15 DIAGNOSIS — F3342 Major depressive disorder, recurrent, in full remission: Secondary | ICD-10-CM | POA: Diagnosis not present

## 2019-11-15 DIAGNOSIS — F410 Panic disorder [episodic paroxysmal anxiety] without agoraphobia: Secondary | ICD-10-CM | POA: Diagnosis not present

## 2019-11-15 DIAGNOSIS — F900 Attention-deficit hyperactivity disorder, predominantly inattentive type: Secondary | ICD-10-CM | POA: Diagnosis not present

## 2020-01-15 ENCOUNTER — Other Ambulatory Visit: Payer: Self-pay | Admitting: Physician Assistant

## 2020-01-15 DIAGNOSIS — G629 Polyneuropathy, unspecified: Secondary | ICD-10-CM

## 2020-01-15 DIAGNOSIS — M5136 Other intervertebral disc degeneration, lumbar region: Secondary | ICD-10-CM

## 2020-01-20 ENCOUNTER — Telehealth: Payer: Self-pay | Admitting: Physician Assistant

## 2020-01-20 DIAGNOSIS — F3111 Bipolar disorder, current episode manic without psychotic features, mild: Secondary | ICD-10-CM | POA: Diagnosis not present

## 2020-01-20 DIAGNOSIS — F3131 Bipolar disorder, current episode depressed, mild: Secondary | ICD-10-CM | POA: Diagnosis not present

## 2020-01-25 ENCOUNTER — Observation Stay (HOSPITAL_COMMUNITY)
Admission: EM | Admit: 2020-01-25 | Discharge: 2020-01-26 | Disposition: A | Discharge status: Other Acute Inpatient Hospital | Payer: BC Managed Care – PPO | Attending: Internal Medicine | Admitting: Internal Medicine

## 2020-01-25 ENCOUNTER — Observation Stay (HOSPITAL_COMMUNITY): Payer: BC Managed Care – PPO

## 2020-01-25 ENCOUNTER — Emergency Department (HOSPITAL_COMMUNITY): Payer: BC Managed Care – PPO

## 2020-01-25 ENCOUNTER — Other Ambulatory Visit: Payer: Self-pay

## 2020-01-25 ENCOUNTER — Encounter (HOSPITAL_COMMUNITY): Payer: Self-pay | Admitting: Emergency Medicine

## 2020-01-25 DIAGNOSIS — J9811 Atelectasis: Secondary | ICD-10-CM | POA: Diagnosis not present

## 2020-01-25 DIAGNOSIS — F322 Major depressive disorder, single episode, severe without psychotic features: Secondary | ICD-10-CM | POA: Diagnosis not present

## 2020-01-25 DIAGNOSIS — R451 Restlessness and agitation: Secondary | ICD-10-CM | POA: Diagnosis not present

## 2020-01-25 DIAGNOSIS — T434X1A Poisoning by butyrophenone and thiothixene neuroleptics, accidental (unintentional), initial encounter: Secondary | ICD-10-CM | POA: Diagnosis not present

## 2020-01-25 DIAGNOSIS — K219 Gastro-esophageal reflux disease without esophagitis: Secondary | ICD-10-CM | POA: Diagnosis not present

## 2020-01-25 DIAGNOSIS — Z841 Family history of disorders of kidney and ureter: Secondary | ICD-10-CM | POA: Diagnosis not present

## 2020-01-25 DIAGNOSIS — Z884 Allergy status to anesthetic agent status: Secondary | ICD-10-CM | POA: Insufficient documentation

## 2020-01-25 DIAGNOSIS — R4182 Altered mental status, unspecified: Secondary | ICD-10-CM | POA: Diagnosis not present

## 2020-01-25 DIAGNOSIS — S199XXA Unspecified injury of neck, initial encounter: Secondary | ICD-10-CM | POA: Diagnosis not present

## 2020-01-25 DIAGNOSIS — Z885 Allergy status to narcotic agent status: Secondary | ICD-10-CM | POA: Diagnosis not present

## 2020-01-25 DIAGNOSIS — T481X2A Poisoning by skeletal muscle relaxants [neuromuscular blocking agents], intentional self-harm, initial encounter: Secondary | ICD-10-CM | POA: Diagnosis not present

## 2020-01-25 DIAGNOSIS — Z818 Family history of other mental and behavioral disorders: Secondary | ICD-10-CM | POA: Insufficient documentation

## 2020-01-25 DIAGNOSIS — Z8249 Family history of ischemic heart disease and other diseases of the circulatory system: Secondary | ICD-10-CM | POA: Diagnosis not present

## 2020-01-25 DIAGNOSIS — T50904A Poisoning by unspecified drugs, medicaments and biological substances, undetermined, initial encounter: Secondary | ICD-10-CM

## 2020-01-25 DIAGNOSIS — F332 Major depressive disorder, recurrent severe without psychotic features: Secondary | ICD-10-CM | POA: Diagnosis not present

## 2020-01-25 DIAGNOSIS — E119 Type 2 diabetes mellitus without complications: Secondary | ICD-10-CM | POA: Diagnosis present

## 2020-01-25 DIAGNOSIS — G4730 Sleep apnea, unspecified: Secondary | ICD-10-CM | POA: Insufficient documentation

## 2020-01-25 DIAGNOSIS — F319 Bipolar disorder, unspecified: Secondary | ICD-10-CM | POA: Diagnosis not present

## 2020-01-25 DIAGNOSIS — S0012XA Contusion of left eyelid and periocular area, initial encounter: Secondary | ICD-10-CM | POA: Diagnosis present

## 2020-01-25 DIAGNOSIS — T1491XA Suicide attempt, initial encounter: Secondary | ICD-10-CM | POA: Diagnosis not present

## 2020-01-25 DIAGNOSIS — T424X2A Poisoning by benzodiazepines, intentional self-harm, initial encounter: Principal | ICD-10-CM | POA: Diagnosis present

## 2020-01-25 DIAGNOSIS — Z825 Family history of asthma and other chronic lower respiratory diseases: Secondary | ICD-10-CM | POA: Diagnosis not present

## 2020-01-25 DIAGNOSIS — Z20822 Contact with and (suspected) exposure to covid-19: Secondary | ICD-10-CM | POA: Insufficient documentation

## 2020-01-25 DIAGNOSIS — E039 Hypothyroidism, unspecified: Secondary | ICD-10-CM | POA: Diagnosis present

## 2020-01-25 DIAGNOSIS — T50901A Poisoning by unspecified drugs, medicaments and biological substances, accidental (unintentional), initial encounter: Secondary | ICD-10-CM

## 2020-01-25 DIAGNOSIS — Z888 Allergy status to other drugs, medicaments and biological substances status: Secondary | ICD-10-CM | POA: Diagnosis not present

## 2020-01-25 DIAGNOSIS — Z800 Family history of malignant neoplasm of digestive organs: Secondary | ICD-10-CM | POA: Diagnosis not present

## 2020-01-25 DIAGNOSIS — F411 Generalized anxiety disorder: Secondary | ICD-10-CM | POA: Diagnosis present

## 2020-01-25 DIAGNOSIS — Z79899 Other long term (current) drug therapy: Secondary | ICD-10-CM | POA: Insufficient documentation

## 2020-01-25 DIAGNOSIS — Z9884 Bariatric surgery status: Secondary | ICD-10-CM | POA: Insufficient documentation

## 2020-01-25 DIAGNOSIS — Z915 Personal history of self-harm: Secondary | ICD-10-CM

## 2020-01-25 DIAGNOSIS — G8929 Other chronic pain: Secondary | ICD-10-CM | POA: Diagnosis not present

## 2020-01-25 DIAGNOSIS — Z833 Family history of diabetes mellitus: Secondary | ICD-10-CM | POA: Insufficient documentation

## 2020-01-25 DIAGNOSIS — S0990XA Unspecified injury of head, initial encounter: Secondary | ICD-10-CM | POA: Diagnosis not present

## 2020-01-25 DIAGNOSIS — R296 Repeated falls: Secondary | ICD-10-CM | POA: Diagnosis not present

## 2020-01-25 DIAGNOSIS — E860 Dehydration: Secondary | ICD-10-CM | POA: Diagnosis present

## 2020-01-25 DIAGNOSIS — M5136 Other intervertebral disc degeneration, lumbar region: Secondary | ICD-10-CM | POA: Diagnosis not present

## 2020-01-25 DIAGNOSIS — E870 Hyperosmolality and hypernatremia: Secondary | ICD-10-CM | POA: Diagnosis not present

## 2020-01-25 DIAGNOSIS — M549 Dorsalgia, unspecified: Secondary | ICD-10-CM | POA: Diagnosis not present

## 2020-01-25 DIAGNOSIS — W19XXXA Unspecified fall, initial encounter: Secondary | ICD-10-CM | POA: Diagnosis present

## 2020-01-25 DIAGNOSIS — E1140 Type 2 diabetes mellitus with diabetic neuropathy, unspecified: Secondary | ICD-10-CM | POA: Diagnosis not present

## 2020-01-25 DIAGNOSIS — Y92009 Unspecified place in unspecified non-institutional (private) residence as the place of occurrence of the external cause: Secondary | ICD-10-CM

## 2020-01-25 HISTORY — DX: Poisoning by unspecified drugs, medicaments and biological substances, accidental (unintentional), initial encounter: T50.901A

## 2020-01-25 LAB — CBC WITH DIFFERENTIAL/PLATELET
Abs Immature Granulocytes: 0.02 10*3/uL (ref 0.00–0.07)
Basophils Absolute: 0.1 10*3/uL (ref 0.0–0.1)
Basophils Relative: 1 %
Eosinophils Absolute: 0.2 10*3/uL (ref 0.0–0.5)
Eosinophils Relative: 3 %
HCT: 43.2 % (ref 39.0–52.0)
Hemoglobin: 14.1 g/dL (ref 13.0–17.0)
Immature Granulocytes: 0 %
Lymphocytes Relative: 40 %
Lymphs Abs: 2.1 10*3/uL (ref 0.7–4.0)
MCH: 29.7 pg (ref 26.0–34.0)
MCHC: 32.6 g/dL (ref 30.0–36.0)
MCV: 91.1 fL (ref 80.0–100.0)
Monocytes Absolute: 0.5 10*3/uL (ref 0.1–1.0)
Monocytes Relative: 9 %
Neutro Abs: 2.4 10*3/uL (ref 1.7–7.7)
Neutrophils Relative %: 47 %
Platelets: 205 10*3/uL (ref 150–400)
RBC: 4.74 MIL/uL (ref 4.22–5.81)
RDW: 13.5 % (ref 11.5–15.5)
WBC: 5.2 10*3/uL (ref 4.0–10.5)
nRBC: 0 % (ref 0.0–0.2)

## 2020-01-25 LAB — POCT I-STAT EG7
Acid-base deficit: 2 mmol/L (ref 0.0–2.0)
Bicarbonate: 24.7 mmol/L (ref 20.0–28.0)
Calcium, Ion: 1.11 mmol/L — ABNORMAL LOW (ref 1.15–1.40)
HCT: 37 % — ABNORMAL LOW (ref 39.0–52.0)
Hemoglobin: 12.6 g/dL — ABNORMAL LOW (ref 13.0–17.0)
O2 Saturation: 72 %
Potassium: 3.8 mmol/L (ref 3.5–5.1)
Sodium: 148 mmol/L — ABNORMAL HIGH (ref 135–145)
TCO2: 26 mmol/L (ref 22–32)
pCO2, Ven: 49.9 mmHg (ref 44.0–60.0)
pH, Ven: 7.302 (ref 7.250–7.430)
pO2, Ven: 42 mmHg (ref 32.0–45.0)

## 2020-01-25 LAB — COMPREHENSIVE METABOLIC PANEL
ALT: 22 U/L (ref 0–44)
AST: 26 U/L (ref 15–41)
Albumin: 3.7 g/dL (ref 3.5–5.0)
Alkaline Phosphatase: 40 U/L (ref 38–126)
Anion gap: 10 (ref 5–15)
BUN: 10 mg/dL (ref 6–20)
CO2: 25 mmol/L (ref 22–32)
Calcium: 8.7 mg/dL — ABNORMAL LOW (ref 8.9–10.3)
Chloride: 110 mmol/L (ref 98–111)
Creatinine, Ser: 0.99 mg/dL (ref 0.61–1.24)
GFR calc Af Amer: >60 mL/min (ref >60)
GFR calc non Af Amer: >60 mL/min (ref >60)
Glucose, Bld: 118 mg/dL — ABNORMAL HIGH (ref 70–99)
Potassium: 4.4 mmol/L (ref 3.5–5.1)
Sodium: 145 mmol/L (ref 135–145)
Total Bilirubin: 0.7 mg/dL (ref 0.3–1.2)
Total Protein: 6.3 g/dL — ABNORMAL LOW (ref 6.5–8.1)

## 2020-01-25 LAB — ETHANOL: Alcohol, Ethyl (B): <10 mg/dL (ref <10)

## 2020-01-25 LAB — T4, FREE: Free T4: 0.88 ng/dL (ref 0.61–1.12)

## 2020-01-25 LAB — SALICYLATE LEVEL: Salicylate Lvl: <7 mg/dL — ABNORMAL LOW (ref 7.0–30.0)

## 2020-01-25 LAB — ACETAMINOPHEN LEVEL
Acetaminophen (Tylenol), Serum: <10 ug/mL — ABNORMAL LOW (ref 10–30)
Acetaminophen (Tylenol), Serum: <10 ug/mL — ABNORMAL LOW (ref 10–30)

## 2020-01-25 LAB — RESPIRATORY PANEL BY RT PCR (FLU A&B, COVID)
Influenza A by PCR: NEGATIVE
Influenza B by PCR: NEGATIVE
SARS Coronavirus 2 by RT PCR: NEGATIVE

## 2020-01-25 LAB — TSH: TSH: 5.438 u[IU]/mL — ABNORMAL HIGH (ref 0.350–4.500)

## 2020-01-25 LAB — VALPROIC ACID LEVEL: Valproic Acid Lvl: 16 ug/mL — ABNORMAL LOW (ref 50.0–100.0)

## 2020-01-25 MED ORDER — SODIUM CHLORIDE 0.9 % IV SOLN: INTRAVENOUS | Status: AC

## 2020-01-25 MED ORDER — SODIUM CHLORIDE 0.9 % IV BOLUS (SEPSIS)
2000.0000 mL | Freq: Once | INTRAVENOUS | Status: AC
  Administered 2020-01-25: 2000 mL via INTRAVENOUS

## 2020-01-25 NOTE — ED Triage Notes (Signed)
Pt here from home for drug overdose. Per wife, pt took 10 xanax and 10 flexaril. Pt stated he took it so he could sleep. EMS woke pt up upon arrival and pt became combative, EMS gave 5mg  haldol. VSS

## 2020-01-25 NOTE — ED Notes (Signed)
Pt was trying to again get out of his bed. Pt had taken off his gown, cardiac wires and his external catheter. Pt very sleepy but trying to talk to staff. Pt stated "everything I have worked for is at home". I let Pt know he took too many of his Medications so that is why he is here. Pt then stated "I didn't take enough" asked Pt if he took many on purpose and Pt stated "Yes"

## 2020-01-25 NOTE — ED Notes (Signed)
This tech was sitting at Chief of Staff with staff and heard Pt in [place]. Myself and Tech De Shaunte walked up to Pt's RM Door to Find the Pt Sitting on his bottom leaned against the door. Myself, Maisie Fus and Jake-RN assisted Pt up b/c Pt would not stay down for [place] to do an assessment on him. No new marks noted on Pt's head or face. All markings were from his previous fall at home prier to coming to the ER today.Helped Pt back to bed but Pt is still trying to take off his leads and get out of the bed.

## 2020-01-25 NOTE — H&P (Signed)
Marvin Espinoza [id number] DOB: 1971-12-01 DOA: 01/25/2020     PCP: Terald Sleeper, PA-C   Outpatient Specialists: NONE    Patient arrived to ER on 01/25/20 at 1752  Patient coming from: home Lives   With family    Chief Complaint:  Chief Complaint  Patient presents with  . Drug Overdose    HPI: Marvin Espinoza is a 49 y.o. male with medical history significant of GERD, neuropathy, degenerative disc disease, diabetes, hypothyroidism, depression, bipolar disorder, history of bariatric surgery,   Sleep apnea no longer on CPAP, chronic back pain  Presented with drug overdose patient took 10 of Xanax 10 of Flexeril stated that he took it so he could sleep. He let his wife know that he was going to take them but when he woke up he had hard time ambulating. EMS called per wife's request he was awake on EMS arrival he became combative EMS gave 5 mg of Haldol vital signs on arrival were stable  Patient has known chronic back pain for which she has been taking Flexeril and gabapentin Patient is in anxiety and usually takes Xanax 1 mg every 4 hours as needed His usual Flexeril dose is 10 mg 3 times a day  Infectious risk factors:  Reports none   In  ER  COVID TEST  NEGATIVE   Lab Results  Component Value Date   [place] NEGATIVE 01/25/2020   Regarding pertinent Chronic problems:      Bipolar disorder on Depakote    Hypothyroidism:  Lab Results  Component Value Date   TSH 5.438 (H) 01/25/2020   on synthroid    While in ER: depakote level 16   The following Work up has been ordered so far:  Orders Placed This Encounter  Procedures  . Respiratory Panel by RT PCR (Flu A&B, Covid) - Nasopharyngeal Swab  . CT Head Wo Contrast  . CT Cervical Spine Wo Contrast  . DG Chest Portable 1 View  . CBC with Differential/Platelet  . Comprehensive metabolic panel  . Rapid urine drug screen (hospital performed)  . Acetaminophen level  . Salicylate level  . Urinalysis,  Routine w reflex microscopic  . Valproic acid level  . TSH  . Consult to hospitalist  ALL PATIENTS BEING ADMITTED/HAVING PROCEDURES NEED COVID-19 SCREENING  . EKG 12-Lead  . EKG 12-Lead     Following Medications were ordered in ER: Medications  sodium chloride 0.9 % bolus 2,000 mL (0 mLs Intravenous Stopped 01/25/20 2026)        Consult Orders  (From admission, onward)         Start     Ordered   01/25/20 2015  Consult to hospitalist  ALL PATIENTS BEING ADMITTED/HAVING PROCEDURES NEED COVID-19 SCREENING Paged [facility], roxanne  Once    Comments: ALL PATIENTS BEING ADMITTED/HAVING PROCEDURES NEED COVID-19 SCREENING  Provider:  (Not yet assigned)  Question Answer Comment  Place call to: [facility],  call [phone]   Reason for Consult Admit      01/25/20 2015           Significant initial  Findings: Abnormal Labs Reviewed  COMPREHENSIVE METABOLIC PANEL - Abnormal; Notable for the following components:      Result Value   Glucose, Bld 118 (*)    Calcium 8.7 (*)    Total Protein 6.3 (*)    All other components within normal limits  ACETAMINOPHEN LEVEL - Abnormal; Notable for the following components:   Acetaminophen (Tylenol), Serum <10 (*)  All other components within normal limits  SALICYLATE LEVEL - Abnormal; Notable for the following components:   Salicylate Lvl [id number] (*)    All other components within normal limits  VALPROIC ACID LEVEL - Abnormal; Notable for the following components:   Valproic Acid Lvl 16 (*)    All other components within normal limits  TSH - Abnormal; Notable for the following components:   TSH 5.438 (*)    All other components within normal limits  ACETAMINOPHEN LEVEL - Abnormal; Notable for the following components:   Acetaminophen (Tylenol), Serum <10 (*)    All other components within normal limits  POCT I-STAT EG7 - Abnormal; Notable for the following components:   Sodium 148 (*)    Calcium, Ion 1.11 (*)    HCT 37.0 (*)     Hemoglobin 12.6 (*)    All other components within normal limits    Otherwise labs showing:    Recent Labs  Lab 01/25/20 1806 01/25/20 2142  NA 145 148*  K 4.4 3.8  CO2 25  --   GLUCOSE 118*  --   BUN 10  --   CREATININE 0.99  --   CALCIUM 8.7*  --     Cr   stable,   Lab Results  Component Value Date   CREATININE 0.99 01/25/2020   CREATININE 1.01 08/20/2019   CREATININE 0.92 01/05/2019    Recent Labs  Lab 01/25/20 1806  AST 26  ALT 22  ALKPHOS 40  BILITOT 0.7  PROT 6.3*  ALBUMIN 3.7   Lab Results  Component Value Date   CALCIUM 8.7 (L) 01/25/2020     WBC      Component Value Date/Time   WBC 5.2 01/25/2020 1806   ANC    Component Value Date/Time   NEUTROABS 2.4 01/25/2020 1806   NEUTROABS 3.1 08/20/2019 0802   ALC No components found for: LYMPHAB   Plt: Lab Results  Component Value Date   PLT 205 01/25/2020     COVID-19 Labs  No results for input(s): DDIMER, FERRITIN, LDH, CRP in the last 72 hours.  Lab Results  Component Value Date   [place] NEGATIVE 01/25/2020    HG/HCT   stable,       Component Value Date/Time   HGB 14.1 01/25/2020 1806   HGB 13.6 08/20/2019 0802   HCT 43.2 01/25/2020 1806   HCT 40.4 08/20/2019 0802    ECG: Ordered Personally reviewed by me showing: HR :  72 Rhythm:  NSR   no evidence of ischemic changes QTC 432   DM  labs:  HbA1C: Recent Labs    08/20/19 0802  HGBA1C 6.5     CBG (last 3)  No results for input(s): GLUCAP in the last 72 hours.     UA ordered    CT HEAD  NON acute  CXR - NON acute      ED Triage Vitals  Enc Vitals Group     BP 01/25/20 1757 118/74     Pulse Rate 01/25/20 1757 72     Resp 01/25/20 1757 14     Temp 01/25/20 1757 (!) 97 F (36.1 C)     Temp Source 01/25/20 1757 Oral     SpO2 01/25/20 1755 97 %     Weight --      Height --      Head Circumference --      Peak Flow --      Pain Score 01/25/20 1800 Asleep  Pain Loc --      Pain Edu? --      Excl. in  [place]? --   TMAX(24)@       Latest  Blood pressure 111/79, pulse 71, temperature (!) 97.5 F (36.4 C), temperature source Oral, resp. rate 10, SpO2 100 %.    Hospitalist was called for admission for drug overdose  Review of Systems:    Pertinent positives include: unable to provide pt is poorly responsive unable to provide detailed review of systems     Past Medical History:   Past Medical History:  Diagnosis Date  . Anxiety   . Bipolar disorder ([place])   . CTS (carpal tunnel syndrome)   . Depression   . GERD (gastroesophageal reflux disease)   . H/O bariatric surgery   . Head injury 05/06/2017  . Hypothyroidism   . Hypothyroidism   . Sleep apnea    no need for CPAP at this time  . Sleep apnea       Past Surgical History:  Procedure Laterality Date  . ANAL FISSURE REPAIR    . BARIATRIC SURGERY    . CARPAL TUNNEL RELEASE Left 05/09/2015   Procedure: LEFT CARPAL TUNNEL RELEASE;  Surgeon: Daryll Brod, MD;  Location: [facility];  Service: Orthopedics;  Laterality: Left;  . CARPAL TUNNEL RELEASE Left 05-09-2015  . CARPAL TUNNEL RELEASE Right 06/20/2015   Procedure: RIGHT CARPAL TUNNEL RELEASE;  Surgeon: Daryll Brod, MD;  Location: [facility];  Service: Orthopedics;  Laterality: Right;  REGIONAL/FAB  . CHOLECYSTECTOMY    . COLONOSCOPY    . UPPER GASTROINTESTINAL ENDOSCOPY      Social History:  Ambulatory   Independently     reports that he has never smoked. He has never used smokeless tobacco. He reports that he does not drink alcohol or use drugs.   Family History:   Family History  Problem Relation Age of Onset  . Diabetes Mother   . Heart disease Mother   . Depression Mother   . Esophageal cancer Mother   . COPD Father   . Heart disease Father   . Kidney disease Father   . Mental illness Father   . Depression Father   . Mental illness Sister   . Depression Sister   . Esophageal cancer Maternal Uncle   . Colon cancer Neg Hx   .  Rectal cancer Neg Hx   . Stomach cancer Neg Hx     Allergies: Allergies  Allergen Reactions  . Aripiprazole Anaphylaxis and Swelling    "slurred speech"   . Cariprazine Hcl Other (See Comments)  . Latuda  [Lurasidone Hcl] Other (See Comments)  . Seroquel [Quetiapine Fumarate]     tardive dyskinesia  . Alcohol Itching    Drinking alcohol  . Cariprazine Other (See Comments)    Tardive dyskenesia  . Hydrocodone-Acetaminophen Itching  . Lurasidone Other (See Comments)    dysconesia  . Victoza [Liraglutide] Other (See Comments)    Severe heartburn   . Victoza [Liraglutide]     Severe heartburn  . Bupropion Rash  . Risperidone Rash     Prior to Admission medications   Medication Sig Start Date End Date Taking? Authorizing Provider  ALPRAZolam Duanne Moron) 1 MG tablet Take 1 mg by mouth 4 (four) times daily as needed for anxiety.    [provider]  Calcium Carbonate-Vit D-Min (CALCIUM 1200 PO) Take by mouth 2 (two) times daily.     [provider]  Carbonyl Iron 15 MG CHEW Chew by mouth daily.     [provider]  cyclobenzaprine (FLEXERIL) 10 MG tablet Take 1 tablet (10 mg total) by mouth 3 (three) times daily as needed. for muscle spams 08/11/19   Terald Sleeper, PA-C  divalproex (DEPAKOTE) 500 MG DR tablet Take 500 mg by mouth 5 (five) times daily.    [provider]  gabapentin (NEURONTIN) 300 MG capsule TAKE 1 TO 2 CAPSULES AT BEDTIME 01/17/20   Terald Sleeper, PA-C  levothyroxine (SYNTHROID) 100 MCG tablet TAKE ONE (1) TABLET EACH DAY 05/11/19   Terald Sleeper, PA-C  lithium 300 MG tablet Take 300 mg by mouth daily. 12/10/18   [provider]  lubiprostone (AMITIZA) 24 MCG capsule TAKE 1 CAPSULE TWICE DAILY WITH A MEAL 09/17/19   Terald Sleeper, PA-C  Multiple Vitamin (MULTIVITAMIN WITH MINERALS) TABS tablet Take 1 tablet by mouth 2 (two) times daily. For Vitamin supplementation 05/15/17   Lindell Spar I, NP  omeprazole (PRILOSEC) 40 MG  capsule Take 1 capsule (40 mg total) by mouth 2 (two) times daily. TAKE ONE (1) CAPSULE EACH DAY 08/11/19   Terald Sleeper, PA-C  promethazine (PHENERGAN) 25 MG tablet TAKE 1 TABLET EVERY 6 HOURS AS NEEDED FOR NAUSEA AND VOMITING 11/04/18   Terald Sleeper, PA-C  vitamin B-12 (CYANOCOBALAMIN) 250 MCG tablet Take 250 mcg by mouth daily.    [provider]  zolpidem (AMBIEN) 10 MG tablet Take 10 mg by mouth at bedtime. 12/10/18   [provider]   Physical Exam: Blood pressure 111/79, pulse 71, temperature (!) 97.5 F (36.4 C), temperature source Oral, resp. rate 10, SpO2 100 %. 1. General:  Highly fluctuating from agitated to sedated state not following comands  Chronically ill  -appearing 2. Psychological: somnolent not Oriented 3. Head/ENT:     Dry Mucous Membranes                          Head traumatic, neck supple                            Poor Dentition 4. SKIN:   decreased Skin turgor,  Skin clean Dry and intact no rash 5. Heart: Regular rate and rhythm no Murmur, no Rub or gallop 6. Lungs:  no wheezes or crackles   7. Abdomen: Soft,   non-tender, Non distended   Obese  8. Lower extremities: no clubbing, cyanosis, no edema 9. Neurologically Grossly intact, moving all 4 extremities equally   10. MSK: Normal range of motion   All other LABS:     Recent Labs  Lab 01/25/20 1806  WBC 5.2  NEUTROABS 2.4  HGB 14.1  HCT 43.2  MCV 91.1  PLT 205     Recent Labs  Lab 01/25/20 1806  NA 145  K 4.4  CL 110  CO2 25  GLUCOSE 118*  BUN 10  CREATININE 0.99  CALCIUM 8.7*     Recent Labs  Lab 01/25/20 1806  AST 26  ALT 22  ALKPHOS 40  BILITOT 0.7  PROT 6.3*  ALBUMIN 3.7       Cultures: No results found for: SDES, SPECREQUEST, CULT, REPTSTATUS   Radiological Exams on Admission: CT Head Wo Contrast  Result Date: 01/25/2020 CLINICAL DATA:  [place]. Hit head. EXAM: CT HEAD WITHOUT CONTRAST CT CERVICAL SPINE WITHOUT CONTRAST TECHNIQUE: Multidetector CT  imaging of  the head and cervical spine was performed following the standard protocol without intravenous contrast. Multiplanar CT image reconstructions of the cervical spine were also generated. COMPARISON:  06/14/2019 FINDINGS: CT HEAD FINDINGS Examination limited by patient motion. Patient combative. Brain: The ventricles are normal in size and configuration. No extra-axial fluid collections are identified. The gray-white differentiation is maintained. No CT findings for acute hemispheric infarction or intracranial hemorrhage. No mass lesions. The brainstem and cerebellum are normal. Vascular: No hyperdense vessels or obvious aneurysm. Skull: No acute skull fracture. No bone lesion. Sinuses/Orbits: The paranasal sinuses and mastoid air cells are clear. The globes are intact. Other: No scalp lesions, laceration or hematoma. CT CERVICAL SPINE FINDINGS Alignment: Normal Skull base and vertebrae: Choose 1 Soft tissues and spinal canal: Choose 1 Disc levels: The spinal canal is fairly generous. No significant spinal or foraminal stenosis. Upper chest: The lung apices are grossly clear. Other: No neck mass or adenopathy. IMPRESSION: 1. No acute intracranial findings or skull fracture. 2. Normal alignment of the cervical vertebral bodies and no acute fracture. Electronically Signed   By: Marijo Sanes M.D.   On: 01/25/2020 19:45   CT Cervical Spine Wo Contrast  Result Date: 01/25/2020 CLINICAL DATA:  [place]. Hit head. EXAM: CT HEAD WITHOUT CONTRAST CT CERVICAL SPINE WITHOUT CONTRAST TECHNIQUE: Multidetector CT imaging of the head and cervical spine was performed following the standard protocol without intravenous contrast. Multiplanar CT image reconstructions of the cervical spine were also generated. COMPARISON:  06/14/2019 FINDINGS: CT HEAD FINDINGS Examination limited by patient motion. Patient combative. Brain: The ventricles are normal in size and configuration. No extra-axial fluid collections are identified. The  gray-white differentiation is maintained. No CT findings for acute hemispheric infarction or intracranial hemorrhage. No mass lesions. The brainstem and cerebellum are normal. Vascular: No hyperdense vessels or obvious aneurysm. Skull: No acute skull fracture. No bone lesion. Sinuses/Orbits: The paranasal sinuses and mastoid air cells are clear. The globes are intact. Other: No scalp lesions, laceration or hematoma. CT CERVICAL SPINE FINDINGS Alignment: Normal Skull base and vertebrae: Choose 1 Soft tissues and spinal canal: Choose 1 Disc levels: The spinal canal is fairly generous. No significant spinal or foraminal stenosis. Upper chest: The lung apices are grossly clear. Other: No neck mass or adenopathy. IMPRESSION: 1. No acute intracranial findings or skull fracture. 2. Normal alignment of the cervical vertebral bodies and no acute fracture. Electronically Signed   By: Marijo Sanes M.D.   On: 01/25/2020 19:45   DG Chest Portable 1 View  Result Date: 01/25/2020 CLINICAL DATA:  Weakness. Drug overdose. EXAM: PORTABLE CHEST 1 VIEW COMPARISON:  None. FINDINGS: Low lung volumes. Upper normal heart size likely accentuated by portable technique and low lung volumes. Minimal bibasilar atelectasis. Pulmonary vasculature is normal. No consolidation, pleural effusion, or pneumothorax. No acute osseous abnormalities are seen. IMPRESSION: Low lung volumes with minimal bibasilar atelectasis. Electronically Signed   By: Keith Rake M.D.   On: 01/25/2020 18:46    Chart has been reviewed    Assessment/Plan  49 y.o. male with medical history significant of GERD, neuropathy, degenerative disc disease, diabetes, hypothyroidism, depression, bipolar disorder, history of bariatric surgery,   Sleep apnea no longer on CPAP, chronic back pain Admitted for drug over dose  Present on Admission: . Drug overdose -monitor in stepdown given highly fluctuating mental status. Repeat EKG in the Morning  continue telemetry    . Gastroesophageal reflux disease without esophagitis -chronic stable resume home medications when able to tolerate   .  Hypothyroidism -check TSH restart home medicines when able   . GAD (generalized anxiety disorder) -on Xanax as needed at home every 4 hours.  Will hold for tonight as patient just had recent overdose.  Need to be mindful of potential benzodiazepine withdrawal if patient remains off of benzodiazepines for prolonged period time.  May need to restart at a later time when patient back to baseline if patient showing no signs of suicidal ideation  . 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  Patient took excessive amount of medication. For tonight keep on suicidal precautions as patient unable to provide his own history reassess in a.m.  Fall -patient had repeated falls in the emergency department requiring repeat CT head imaging As patient unable to provide any history, Sitter ordered Other plan as per orders.  DVT prophylaxis:  SCD   Code Status:  FULL CODE       Family Communication:   Family not at  Bedside   Disposition Plan:       To home once workup is complete and patient is stable                                         Behavioral health  consulted                    Consults called: none  Admission status:  ED Disposition    None      Obs       Level of care      SDU tele indefinitely please discontinue once patient no longer qualifies   Precautions: admitted as  Covid Negative  No active isolations    PPE: Used by the provider:   P100  eye Goggles,  Gloves     Shavonta Gossen 01/25/2020, 10:47 PM    [facility]     after 2 AM please page floor coverage PA If 7AM-7PM, please contact the day team taking care of the patient using [email or web address]   Patient was evaluated in the context of the global COVID-19 pandemic, which necessitated consideration that the  patient might be at risk for infection with the SARS-CoV-2 virus that causes COVID-19. Institutional protocols and algorithms that pertain to the evaluation of patients at risk for COVID-19 are in a state of rapid change based on information released by regulatory bodies including the CDC and federal and state organizations. These policies and algorithms were followed during the patient's care.

## 2020-01-25 NOTE — ED Provider Notes (Signed)
[facility] EMERG[facility] DEPARTMENT Provider Note   CSN: [id number] Arrival date & time: 01/25/20  1752     History Chief Complaint  Patient presents with  . Drug Overdose    Marvin Espinoza is a 49 y.o. male.  Patient's wife states that the patient took 69 Flexeril and an unknown number of Xanax around 11:00.  He went to bed and get up around 3 and he was unable to walk so she called the paramedics.  He was combative with the paramedics so they gave him 5 Haldol IM.  Patient is depressed because his mother died recently  The history is provided by a relative. No language interpreter was used.  Drug Overdose This is a new problem. The current episode started 6 to 12 hours ago. The problem occurs rarely. The problem has not changed since onset.Pertinent negatives include no chest pain. Nothing aggravates the symptoms. Nothing relieves the symptoms. He has tried nothing for the symptoms. The treatment provided no relief.       Past Medical History:  Diagnosis Date  . Anxiety   . Bipolar disorder ([place])   . CTS (carpal tunnel syndrome)   . Depression   . GERD (gastroesophageal reflux disease)   . H/O bariatric surgery   . Head injury 05/06/2017  . Hypothyroidism   . Hypothyroidism   . Sleep apnea    no need for CPAP at this time  . Sleep apnea     Patient Active Problem List   Diagnosis Date Noted  . Onychomycosis 01/05/2019  . Neuropathy 01/05/2019  . MDD (major depressive disorder), recurrent episode, severe ([place]) 10/04/2018  . OSA (obstructive sleep apnea) 09/07/2018  . Bilateral hip pain 10/24/2017  . Shoulder pain, bilateral 10/24/2017  . Arthritis 10/24/2017  . Osteoarthritis of both hips 10/24/2017  . Avascular necrosis of bone of hip, right ([place]) 10/24/2017  . 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 PTSD (post-traumatic stress disorder) 08/20/2017  . Tardive dyskinesia 08/20/2017  . Severe major depression ([place]) 05/10/2017  . Affective  psychosis, bipolar ([place]) 05/10/2017  . Vision disturbance 05/06/2017  . History of concussion 02/10/2017  . Mood disorder ([place]) 02/10/2017  . Adjustment insomnia 01/24/2017  . Adjustment disorder with anxious mood 01/24/2017  . GAD (generalized anxiety disorder) 01/24/2017  . Gastroesophageal reflux disease without esophagitis 11/19/2016  . Hypothyroidism 11/19/2016  . DDD (degenerative disc disease), lumbar 11/19/2016    Past Surgical History:  Procedure Laterality Date  . ANAL FISSURE REPAIR    . BARIATRIC SURGERY    . CARPAL TUNNEL RELEASE Left 05/09/2015   Procedure: LEFT CARPAL TUNNEL RELEASE;  Surgeon: Daryll Brod, MD;  Location: [facility];  Service: Orthopedics;  Laterality: Left;  . CARPAL TUNNEL RELEASE Left 05-09-2015  . CARPAL TUNNEL RELEASE Right 06/20/2015   Procedure: RIGHT CARPAL TUNNEL RELEASE;  Surgeon: Daryll Brod, MD;  Location: [facility];  Service: Orthopedics;  Laterality: Right;  REGIONAL/FAB  . CHOLECYSTECTOMY    . COLONOSCOPY    . UPPER GASTROINTESTINAL ENDOSCOPY         Family History  Problem Relation Age of Onset  . Diabetes Mother   . Heart disease Mother   . Depression Mother   . Esophageal cancer Mother   . COPD Father   . Heart disease Father   . Kidney disease Father   . Mental illness Father   . Depression Father   . Mental illness Sister   . Depression Sister   .  Esophageal cancer Maternal Uncle   . Colon cancer Neg Hx   . Rectal cancer Neg Hx   . Stomach cancer Neg Hx     Social History   Tobacco Use  . Smoking status: Never Smoker  . Smokeless tobacco: Never Used  Substance Use Topics  . Alcohol use: No  . Drug use: No    Home Medications Prior to Admission medications   Medication Sig Start Date End Date Taking? Authorizing Provider  ALPRAZolam Duanne Moron) 1 MG tablet Take 1 mg by mouth 4 (four) times daily as needed for anxiety.    [provider]  Calcium Carbonate-Vit D-Min (CALCIUM  1200 PO) Take by mouth 2 (two) times daily.     [provider]  Carbonyl Iron 15 MG CHEW Chew by mouth daily.     [provider]  cyclobenzaprine (FLEXERIL) 10 MG tablet Take 1 tablet (10 mg total) by mouth 3 (three) times daily as needed. for muscle spams 08/11/19   Terald Sleeper, PA-C  divalproex (DEPAKOTE) 500 MG DR tablet Take 500 mg by mouth 5 (five) times daily.    [provider]  gabapentin (NEURONTIN) 300 MG capsule TAKE 1 TO 2 CAPSULES AT BEDTIME 01/17/20   Terald Sleeper, PA-C  levothyroxine (SYNTHROID) 100 MCG tablet TAKE ONE (1) TABLET EACH DAY 05/11/19   Terald Sleeper, PA-C  lithium 300 MG tablet Take 300 mg by mouth daily. 12/10/18   [provider]  lubiprostone (AMITIZA) 24 MCG capsule TAKE 1 CAPSULE TWICE DAILY WITH A MEAL 09/17/19   Terald Sleeper, PA-C  Multiple Vitamin (MULTIVITAMIN WITH MINERALS) TABS tablet Take 1 tablet by mouth 2 (two) times daily. For Vitamin supplementation 05/15/17   Lindell Spar I, NP  omeprazole (PRILOSEC) 40 MG capsule Take 1 capsule (40 mg total) by mouth 2 (two) times daily. TAKE ONE (1) CAPSULE EACH DAY 08/11/19   Terald Sleeper, PA-C  promethazine (PHENERGAN) 25 MG tablet TAKE 1 TABLET EVERY 6 HOURS AS NEEDED FOR NAUSEA AND VOMITING 11/04/18   Terald Sleeper, PA-C  vitamin B-12 (CYANOCOBALAMIN) 250 MCG tablet Take 250 mcg by mouth daily.    [provider]  zolpidem (AMBIEN) 10 MG tablet Take 10 mg by mouth at bedtime. 12/10/18   [provider]    Allergies    Aripiprazole, Cariprazine hcl, Latuda  [lurasidone hcl], Seroquel [quetiapine fumarate], Alcohol, Cariprazine, Hydrocodone-acetaminophen, Lurasidone, Victoza [liraglutide], Victoza [liraglutide], Bupropion, and Risperidone  Review of Systems   Review of Systems  Unable to perform ROS: Mental status change  Cardiovascular: Negative for chest pain.    Physical Exam Updated Vital Signs BP 111/79   Pulse 71   Temp (!) 97.5 F (36.4  C) (Oral)   Resp 10   SpO2 100%   Physical Exam Vitals reviewed.  Constitutional:      Appearance: He is well-developed.     Comments: Lethargic  HENT:     Head: Normocephalic.     Nose: Nose normal.     Mouth/Throat:     Comments: She has an adequate gag reflex Eyes:     General: No scleral icterus.    Conjunctiva/sclera: Conjunctivae normal.  Neck:     Thyroid: No thyromegaly.  Cardiovascular:     Rate and Rhythm: Normal rate and regular rhythm.     Heart sounds: No murmur. No friction rub. No gallop.   Pulmonary:     Breath sounds: No stridor. No wheezing or rales.  Chest:  Chest wall: No tenderness.  Abdominal:     General: There is no distension.     Tenderness: There is no abdominal tenderness. There is no rebound.  Musculoskeletal:        General: Normal range of motion.     Cervical back: Neck supple.  Lymphadenopathy:     Cervical: No cervical adenopathy.  Skin:    Findings: No erythema or rash.  Neurological:     Motor: No abnormal muscle tone.     Coordination: Coordination normal.     Comments: Patient moves all extremities to painful stimuli.  Patient will just groan when asked questions  Psychiatric:        Behavior: Behavior normal.     ED Results / Procedures / Treatments   Labs (all labs ordered are listed, but only abnormal results are displayed) Labs Reviewed  COMPREHENSIVE METABOLIC PANEL - Abnormal; Notable for the following components:      Result Value   Glucose, Bld 118 (*)    Calcium 8.7 (*)    Total Protein 6.3 (*)    All other components within normal limits  ACETAMINOPHEN LEVEL - Abnormal; Notable for the following components:   Acetaminophen (Tylenol), Serum <10 (*)    All other components within normal limits  SALICYLATE LEVEL - Abnormal; Notable for the following components:   Salicylate Lvl [id number] (*)    All other components within normal limits  VALPROIC ACID LEVEL - Abnormal; Notable for the following components:    Valproic Acid Lvl 16 (*)    All other components within normal limits  TSH - Abnormal; Notable for the following components:   TSH 5.438 (*)    All other components within normal limits  RESPIRATORY PANEL BY RT PCR (FLU A&B, COVID)  CBC WITH DIFFERENTIAL/PLATELET  RAPID URINE DRUG SCREEN, HOSP PERFORMED  URINALYSIS, ROUTINE W REFLEX MICROSCOPIC  BLOOD GAS, VENOUS    EKG EKG Interpretation  Date/Time:  Tuesday January 25 2020 17:55:13 EST Ventricular Rate:  72 PR Interval:    QRS Duration: 102 QT Interval:  394 QTC Calculation: 432 R Axis:   59 Text Interpretation: Sinus rhythm Low voltage, extremity leads Confirmed by Milton Ferguson [id number]) on 01/25/2020 6:18:58 PM   Radiology CT Head Wo Contrast  Result Date: 01/25/2020 CLINICAL DATA:  [place]. Hit head. EXAM: CT HEAD WITHOUT CONTRAST CT CERVICAL SPINE WITHOUT CONTRAST TECHNIQUE: Multidetector CT imaging of the head and cervical spine was performed following the standard protocol without intravenous contrast. Multiplanar CT image reconstructions of the cervical spine were also generated. COMPARISON:  06/14/2019 FINDINGS: CT HEAD FINDINGS Examination limited by patient motion. Patient combative. Brain: The ventricles are normal in size and configuration. No extra-axial fluid collections are identified. The gray-white differentiation is maintained. No CT findings for acute hemispheric infarction or intracranial hemorrhage. No mass lesions. The brainstem and cerebellum are normal. Vascular: No hyperdense vessels or obvious aneurysm. Skull: No acute skull fracture. No bone lesion. Sinuses/Orbits: The paranasal sinuses and mastoid air cells are clear. The globes are intact. Other: No scalp lesions, laceration or hematoma. CT CERVICAL SPINE FINDINGS Alignment: Normal Skull base and vertebrae: Choose 1 Soft tissues and spinal canal: Choose 1 Disc levels: The spinal canal is fairly generous. No significant spinal or foraminal stenosis. Upper chest:  The lung apices are grossly clear. Other: No neck mass or adenopathy. IMPRESSION: 1. No acute intracranial findings or skull fracture. 2. Normal alignment of the cervical vertebral bodies and no acute fracture. Electronically Signed  By: Marijo Sanes M.D.   On: 01/25/2020 19:45   CT Cervical Spine Wo Contrast  Result Date: 01/25/2020 CLINICAL DATA:  [place]. Hit head. EXAM: CT HEAD WITHOUT CONTRAST CT CERVICAL SPINE WITHOUT CONTRAST TECHNIQUE: Multidetector CT imaging of the head and cervical spine was performed following the standard protocol without intravenous contrast. Multiplanar CT image reconstructions of the cervical spine were also generated. COMPARISON:  06/14/2019 FINDINGS: CT HEAD FINDINGS Examination limited by patient motion. Patient combative. Brain: The ventricles are normal in size and configuration. No extra-axial fluid collections are identified. The gray-white differentiation is maintained. No CT findings for acute hemispheric infarction or intracranial hemorrhage. No mass lesions. The brainstem and cerebellum are normal. Vascular: No hyperdense vessels or obvious aneurysm. Skull: No acute skull fracture. No bone lesion. Sinuses/Orbits: The paranasal sinuses and mastoid air cells are clear. The globes are intact. Other: No scalp lesions, laceration or hematoma. CT CERVICAL SPINE FINDINGS Alignment: Normal Skull base and vertebrae: Choose 1 Soft tissues and spinal canal: Choose 1 Disc levels: The spinal canal is fairly generous. No significant spinal or foraminal stenosis. Upper chest: The lung apices are grossly clear. Other: No neck mass or adenopathy. IMPRESSION: 1. No acute intracranial findings or skull fracture. 2. Normal alignment of the cervical vertebral bodies and no acute fracture. Electronically Signed   By: Marijo Sanes M.D.   On: 01/25/2020 19:45   DG Chest Portable 1 View  Result Date: 01/25/2020 CLINICAL DATA:  Weakness. Drug overdose. EXAM: PORTABLE CHEST 1 VIEW COMPARISON:   None. FINDINGS: Low lung volumes. Upper normal heart size likely accentuated by portable technique and low lung volumes. Minimal bibasilar atelectasis. Pulmonary vasculature is normal. No consolidation, pleural effusion, or pneumothorax. No acute osseous abnormalities are seen. IMPRESSION: Low lung volumes with minimal bibasilar atelectasis. Electronically Signed   By: Keith Rake M.D.   On: 01/25/2020 18:46    Procedures Procedures (including critical care time)  Medications Ordered in ED Medications  sodium chloride 0.9 % bolus 2,000 mL (0 mLs Intravenous Stopped 01/25/20 2026)    ED Course  I have reviewed the triage vital signs and the nursing notes.  Pertinent labs & imaging results that were available during my care of the patient were reviewed by me and considered in my medical decision making (see chart for details). CRITICAL CARE Performed by: Milton Ferguson Total critical care time: 40 minutes Critical care time was exclusive of separately billable procedures and treating other patients. Critical care was necessary to treat or prevent imminent or life-threatening deterioration. Critical care was time spent personally by me on the following activities: development of treatment plan with patient and/or surrogate as well as nursing, discussions with consultants, evaluation of patient's response to treatment, examination of patient, obtaining history from patient or surrogate, ordering and performing treatments and interventions, ordering and review of laboratory studies, ordering and review of radiographic studies, pulse oximetry and re-evaluation of patient's condition.    MDM Rules/Calculators/A&P                     Patient with overdose of Xanax Flexeril and he was given Haldol by the paramedics.  Labs and x-rays unremarkable.  Patient will be admitted for overdose and altered mental status  Final Clinical Impression(s) / ED Diagnoses Final diagnoses:  Accidental drug  overdose, initial encounter    Rx / DC Orders ED Discharge Orders    None       Milton Ferguson, MD 01/25/20 2054

## 2020-01-25 NOTE — ED Notes (Signed)
This RN spoke to pt's wife Mickel Baas on the phone & updated her about the pt's status & also informed her that he fell out of bed, that resulted in no visual injuries at this time. Wife reported to this RN that pt has been suicidal for a while now, and this is not his first time overdosing, he has been more depressed than usual since his mother passed away a few weeks ago. She also stated that she prefers him to go straight to a behavioral health facility and NOT home from here, because of her concern for him.

## 2020-01-25 NOTE — ED Notes (Signed)
Pt transported to CT ?

## 2020-01-26 ENCOUNTER — Encounter (HOSPITAL_COMMUNITY): Payer: Self-pay | Admitting: Behavioral Health

## 2020-01-26 ENCOUNTER — Other Ambulatory Visit: Payer: Self-pay

## 2020-01-26 ENCOUNTER — Encounter (HOSPITAL_COMMUNITY): Payer: Self-pay | Admitting: Internal Medicine

## 2020-01-26 ENCOUNTER — Inpatient Hospital Stay (HOSPITAL_COMMUNITY)
Admission: AD | Admit: 2020-01-26 | Discharge: 2020-01-29 | DRG: 885 | Disposition: A | Discharge status: Home / Self Care | Payer: BC Managed Care – PPO | Source: Intra-hospital | Attending: Psychiatry | Admitting: Psychiatry

## 2020-01-26 DIAGNOSIS — F332 Major depressive disorder, recurrent severe without psychotic features: Secondary | ICD-10-CM

## 2020-01-26 DIAGNOSIS — Z20822 Contact with and (suspected) exposure to covid-19: Secondary | ICD-10-CM | POA: Diagnosis present

## 2020-01-26 DIAGNOSIS — F411 Generalized anxiety disorder: Secondary | ICD-10-CM | POA: Diagnosis present

## 2020-01-26 DIAGNOSIS — M19012 Primary osteoarthritis, left shoulder: Secondary | ICD-10-CM | POA: Diagnosis present

## 2020-01-26 DIAGNOSIS — E1142 Type 2 diabetes mellitus with diabetic polyneuropathy: Secondary | ICD-10-CM | POA: Diagnosis not present

## 2020-01-26 DIAGNOSIS — G8929 Other chronic pain: Secondary | ICD-10-CM | POA: Diagnosis present

## 2020-01-26 DIAGNOSIS — Z841 Family history of disorders of kidney and ureter: Secondary | ICD-10-CM | POA: Diagnosis not present

## 2020-01-26 DIAGNOSIS — Z915 Personal history of self-harm: Secondary | ICD-10-CM

## 2020-01-26 DIAGNOSIS — M5136 Other intervertebral disc degeneration, lumbar region: Secondary | ICD-10-CM

## 2020-01-26 DIAGNOSIS — Z79890 Hormone replacement therapy (postmenopausal): Secondary | ICD-10-CM

## 2020-01-26 DIAGNOSIS — T50901A Poisoning by unspecified drugs, medicaments and biological substances, accidental (unintentional), initial encounter: Secondary | ICD-10-CM | POA: Diagnosis present

## 2020-01-26 DIAGNOSIS — G4733 Obstructive sleep apnea (adult) (pediatric): Secondary | ICD-10-CM | POA: Diagnosis not present

## 2020-01-26 DIAGNOSIS — E039 Hypothyroidism, unspecified: Secondary | ICD-10-CM | POA: Diagnosis present

## 2020-01-26 DIAGNOSIS — T424X2A Poisoning by benzodiazepines, intentional self-harm, initial encounter: Secondary | ICD-10-CM | POA: Diagnosis present

## 2020-01-26 DIAGNOSIS — W19XXXA Unspecified fall, initial encounter: Secondary | ICD-10-CM | POA: Diagnosis present

## 2020-01-26 DIAGNOSIS — E860 Dehydration: Secondary | ICD-10-CM | POA: Diagnosis present

## 2020-01-26 DIAGNOSIS — G629 Polyneuropathy, unspecified: Secondary | ICD-10-CM

## 2020-01-26 DIAGNOSIS — G4730 Sleep apnea, unspecified: Secondary | ICD-10-CM | POA: Diagnosis present

## 2020-01-26 DIAGNOSIS — Z79899 Other long term (current) drug therapy: Secondary | ICD-10-CM

## 2020-01-26 DIAGNOSIS — Y92009 Unspecified place in unspecified non-institutional (private) residence as the place of occurrence of the external cause: Secondary | ICD-10-CM | POA: Diagnosis not present

## 2020-01-26 DIAGNOSIS — T1491XA Suicide attempt, initial encounter: Secondary | ICD-10-CM

## 2020-01-26 DIAGNOSIS — Z833 Family history of diabetes mellitus: Secondary | ICD-10-CM

## 2020-01-26 DIAGNOSIS — R45851 Suicidal ideations: Secondary | ICD-10-CM | POA: Diagnosis present

## 2020-01-26 DIAGNOSIS — E119 Type 2 diabetes mellitus without complications: Secondary | ICD-10-CM | POA: Diagnosis present

## 2020-01-26 DIAGNOSIS — Z825 Family history of asthma and other chronic lower respiratory diseases: Secondary | ICD-10-CM | POA: Diagnosis not present

## 2020-01-26 DIAGNOSIS — M19011 Primary osteoarthritis, right shoulder: Secondary | ICD-10-CM | POA: Diagnosis not present

## 2020-01-26 DIAGNOSIS — G4700 Insomnia, unspecified: Secondary | ICD-10-CM | POA: Diagnosis present

## 2020-01-26 DIAGNOSIS — Z818 Family history of other mental and behavioral disorders: Secondary | ICD-10-CM

## 2020-01-26 DIAGNOSIS — Z800 Family history of malignant neoplasm of digestive organs: Secondary | ICD-10-CM | POA: Diagnosis not present

## 2020-01-26 DIAGNOSIS — R296 Repeated falls: Secondary | ICD-10-CM | POA: Diagnosis present

## 2020-01-26 DIAGNOSIS — E870 Hyperosmolality and hypernatremia: Secondary | ICD-10-CM | POA: Diagnosis present

## 2020-01-26 DIAGNOSIS — Z8249 Family history of ischemic heart disease and other diseases of the circulatory system: Secondary | ICD-10-CM | POA: Diagnosis not present

## 2020-01-26 DIAGNOSIS — R4182 Altered mental status, unspecified: Secondary | ICD-10-CM | POA: Diagnosis present

## 2020-01-26 DIAGNOSIS — K219 Gastro-esophageal reflux disease without esophagitis: Secondary | ICD-10-CM | POA: Diagnosis present

## 2020-01-26 DIAGNOSIS — S0012XA Contusion of left eyelid and periocular area, initial encounter: Secondary | ICD-10-CM | POA: Diagnosis present

## 2020-01-26 DIAGNOSIS — Z9884 Bariatric surgery status: Secondary | ICD-10-CM | POA: Diagnosis not present

## 2020-01-26 DIAGNOSIS — T481X2A Poisoning by skeletal muscle relaxants [neuromuscular blocking agents], intentional self-harm, initial encounter: Secondary | ICD-10-CM | POA: Diagnosis present

## 2020-01-26 HISTORY — DX: Poisoning by benzodiazepines, intentional self-harm, initial encounter: T42.4X2A

## 2020-01-26 LAB — HIV ANTIBODY (ROUTINE TESTING W REFLEX): HIV Screen 4th Generation wRfx: NONREACTIVE

## 2020-01-26 LAB — MAGNESIUM: Magnesium: 2.1 mg/dL (ref 1.7–2.4)

## 2020-01-26 LAB — PHOSPHORUS: Phosphorus: 3.8 mg/dL (ref 2.5–4.6)

## 2020-01-26 MED ORDER — THIAMINE HCL 100 MG PO TABS: 100.0000 mg | ORAL_TABLET | Freq: Every day | ORAL | Status: DC

## 2020-01-26 MED ORDER — DIVALPROEX SODIUM 250 MG PO DR TAB
250.0000 mg | DELAYED_RELEASE_TABLET | Freq: Three times a day (TID) | ORAL | Status: DC
  Administered 2020-01-27 – 2020-01-29 (×8): 250 mg via ORAL
  Filled 2020-01-26 (×14): qty 1

## 2020-01-26 MED ORDER — TRAZODONE HCL 50 MG PO TABS
50.0000 mg | ORAL_TABLET | Freq: Every evening | ORAL | Status: DC | PRN
  Administered 2020-01-27 – 2020-01-28 (×3): 50 mg via ORAL
  Filled 2020-01-26 (×3): qty 1

## 2020-01-26 MED ORDER — DEXTROSE 5 % IV SOLN: INTRAVENOUS | Status: DC

## 2020-01-26 MED ORDER — LORAZEPAM 2 MG/ML IJ SOLN
2.0000 mg | Freq: Once | INTRAMUSCULAR | Status: AC
  Administered 2020-01-26: 2 mg via INTRAMUSCULAR

## 2020-01-26 MED ORDER — HYDROXYZINE HCL 25 MG PO TABS
25.0000 mg | ORAL_TABLET | Freq: Three times a day (TID) | ORAL | Status: DC | PRN
  Administered 2020-01-27 – 2020-01-28 (×2): 25 mg via ORAL
  Filled 2020-01-26 (×2): qty 1

## 2020-01-26 MED ORDER — DIVALPROEX SODIUM 250 MG PO DR TAB
250.0000 mg | DELAYED_RELEASE_TABLET | Freq: Three times a day (TID) | ORAL | Status: DC
  Administered 2020-01-26: 250 mg via ORAL
  Filled 2020-01-26: qty 1

## 2020-01-26 MED ORDER — MAGNESIUM HYDROXIDE 400 MG/5ML PO SUSP: 30.0000 mL | Freq: Every day | ORAL | Status: DC | PRN

## 2020-01-26 MED ORDER — HALOPERIDOL LACTATE 5 MG/ML IJ SOLN
INTRAMUSCULAR | Status: AC
  Administered 2020-01-26: 5 mg via INTRAMUSCULAR
  Filled 2020-01-26: qty 1

## 2020-01-26 MED ORDER — LEVOTHYROXINE SODIUM 100 MCG PO TABS
100.0000 ug | ORAL_TABLET | Freq: Every day | ORAL | Status: DC
  Administered 2020-01-27 – 2020-01-29 (×3): 100 ug via ORAL
  Filled 2020-01-26 (×6): qty 1

## 2020-01-26 MED ORDER — CHLORDIAZEPOXIDE HCL 25 MG PO CAPS: 25.0000 mg | ORAL_CAPSULE | Freq: Four times a day (QID) | ORAL | 0 refills | Status: DC | PRN

## 2020-01-26 MED ORDER — THIAMINE HCL 100 MG PO TABS
100.0000 mg | ORAL_TABLET | Freq: Every day | ORAL | Status: DC
  Administered 2020-01-26: 100 mg via ORAL
  Filled 2020-01-26: qty 1

## 2020-01-26 MED ORDER — PANTOPRAZOLE SODIUM 40 MG PO TBEC
40.0000 mg | DELAYED_RELEASE_TABLET | Freq: Every day | ORAL | Status: DC
  Administered 2020-01-26: 40 mg via ORAL
  Filled 2020-01-26: qty 1

## 2020-01-26 MED ORDER — THIAMINE HCL 100 MG PO TABS
100.0000 mg | ORAL_TABLET | Freq: Every day | ORAL | Status: DC
  Administered 2020-01-27 – 2020-01-29 (×3): 100 mg via ORAL
  Filled 2020-01-26 (×6): qty 1

## 2020-01-26 MED ORDER — LOPERAMIDE HCL 2 MG PO CAPS: 2.0000 mg | ORAL_CAPSULE | ORAL | Status: DC | PRN

## 2020-01-26 MED ORDER — ALUM & MAG HYDROXIDE-SIMETH 200-200-20 MG/5ML PO SUSP: 30.0000 mL | ORAL | Status: DC | PRN

## 2020-01-26 MED ORDER — HYDROXYZINE HCL 25 MG PO TABS: 25.0000 mg | ORAL_TABLET | Freq: Four times a day (QID) | ORAL | Status: DC | PRN

## 2020-01-26 MED ORDER — ADULT MULTIVITAMIN W/MINERALS CH
1.0000 | ORAL_TABLET | Freq: Every day | ORAL | Status: DC
  Administered 2020-01-27 – 2020-01-29 (×3): 1 via ORAL
  Filled 2020-01-26 (×6): qty 1

## 2020-01-26 MED ORDER — ONDANSETRON 4 MG PO TBDP
4.0000 mg | ORAL_TABLET | Freq: Four times a day (QID) | ORAL | Status: DC | PRN
  Filled 2020-01-26: qty 1

## 2020-01-26 MED ORDER — ONDANSETRON 4 MG PO TBDP: 4.0000 mg | ORAL_TABLET | Freq: Four times a day (QID) | ORAL | Status: DC | PRN

## 2020-01-26 MED ORDER — LORAZEPAM 1 MG PO TABS: 1.0000 mg | ORAL_TABLET | Freq: Four times a day (QID) | ORAL | Status: DC | PRN

## 2020-01-26 MED ORDER — LEVOTHYROXINE SODIUM 100 MCG PO TABS
100.0000 ug | ORAL_TABLET | Freq: Every day | ORAL | Status: DC
  Administered 2020-01-26: 100 ug via ORAL
  Filled 2020-01-26: qty 1

## 2020-01-26 MED ORDER — HALOPERIDOL LACTATE 5 MG/ML IJ SOLN: 5.0000 mg | Freq: Four times a day (QID) | INTRAMUSCULAR | Status: AC | PRN

## 2020-01-26 MED ORDER — CHLORDIAZEPOXIDE HCL 25 MG PO CAPS: 25.0000 mg | ORAL_CAPSULE | Freq: Four times a day (QID) | ORAL | Status: DC | PRN

## 2020-01-26 MED ORDER — PANTOPRAZOLE SODIUM 40 MG PO TBEC
40.0000 mg | DELAYED_RELEASE_TABLET | Freq: Every day | ORAL | Status: DC
  Administered 2020-01-27 – 2020-01-29 (×3): 40 mg via ORAL
  Filled 2020-01-26 (×6): qty 1

## 2020-01-26 MED ORDER — LORAZEPAM 2 MG/ML IJ SOLN
1.0000 mg | Freq: Once | INTRAMUSCULAR | Status: DC
  Filled 2020-01-26: qty 1

## 2020-01-26 MED ORDER — ACETAMINOPHEN 325 MG PO TABS: 650.0000 mg | ORAL_TABLET | Freq: Four times a day (QID) | ORAL | Status: DC | PRN

## 2020-01-26 MED ORDER — ADULT MULTIVITAMIN W/MINERALS CH: 1.0000 | ORAL_TABLET | Freq: Every day | ORAL | Status: DC

## 2020-01-26 MED ORDER — DIVALPROEX SODIUM 250 MG PO DR TAB: 250.0000 mg | DELAYED_RELEASE_TABLET | Freq: Three times a day (TID) | ORAL | Status: DC

## 2020-01-26 NOTE — Progress Notes (Signed)
Patient came combative and verbally aggressive with staff multiple times during the night. During one of these times the patient removed his IV with his teeth. He was given IM haldol and later IM ativan. At this time his fluids are still off and he has no IV access. Night MD and day shift RN are aware.   Elesa Hacker, RN

## 2020-01-26 NOTE — Tx Team (Signed)
Initial Treatment Plan 01/26/2020 9:01 PM [name] [id number]    PATIENT STRESSORS: Health problems Loss of mother   PATIENT STRENGTHS: Average or above average intelligence Capable of independent living Communication skills   PATIENT IDENTIFIED PROBLEMS:      "I wish it was me and not my mom"    " depression"             DISCHARGE CRITERIA:  Improved stabilization in mood, thinking, and/or behavior Need for constant or close observation no longer present Reduction of life-threatening or endangering symptoms to within safe limits  PRELIMINARY DISCHARGE PLAN: Attend aftercare/continuing care group Outpatient therapy Return to previous living arrangement  PATIENT/FAMILY INVOLVEMENT: This treatment plan has been presented to and reviewed with the patient, Marvin Espinoza patient has been given the opportunity to ask questions and make suggestions.  Waymond Cera, RN 01/26/2020, 9:01 PM

## 2020-01-26 NOTE — Progress Notes (Signed)
Pt transported to [facility] via [place] [employer]. No belongings found on unit. Carroll Kinds RN

## 2020-01-26 NOTE — Progress Notes (Signed)
Patient is a 49 year old male admitted from [facility] for intentional OD of Xanax and Flexeril. Pt reported increasing depression since the loss of his mother two months ago. Pt presented as depressed, somewhat lethargic, calm, cooperative during admission interview. Pt continues to endorse passive SI, stating, "I wish the police would've put a bullet through my head".  Pt reports having had A/V H intermittently since the death of his mother. VS taken. Skin assessment revealed abrasions on left side of face and some bruising at right knee, which patient reports having no memory of how he was injured. Patient had no belongings to search. Admission paperwork completed and signed. Verbal understanding expressed. Fall precautions initiated for safety. Patient oriented to the unit and provided with fluids.

## 2020-01-26 NOTE — Progress Notes (Signed)
Pt accepted to [facility]; bed 304-1     Call report to [phone]    Pt is scheduled to arrive at [place], [place], [place] Disposition [name] [phone]

## 2020-01-26 NOTE — Progress Notes (Signed)
OT Cancellation Note  Patient Details Name: Makhari Postiglione MRN: [id number] DOB: May 27, 1971   Cancelled Treatment:    Reason Eval/Treat Not Completed: Patient's level of consciousness;Other (comment). Per chart review patient has been extremely agitated and combative. RN  reports he has received meds to address behaviors and is not appropriate to participate today. OT will check back tomorrow as appropriate    Britt Bottom 01/26/2020, 10:58 AM

## 2020-01-26 NOTE — ED Notes (Signed)
Pt transported by this RN to 6E16 via cart. Pt conscious, breathing, and A&Ox4. No distress noted. All belongings with pt. IV fluids infusing per MAR. Pt carte endorsed to endorsed to [place] RN.

## 2020-01-26 NOTE — Progress Notes (Signed)
PROGRESS NOTE    Marvin Espinoza  [id number] DOB: [birth year] DOA: 01/25/2020 PCP: Terald Sleeper, PA-C   Brief Narrative: Patient is a 49 year old male with history of GERD, neuropathy, degenerative disc disease, diabetes, hypothyroidism, depression, bipolar disorder, history of bariatric surgery, sleep apnea but not on CPAP, chronic back pain who presented with intentional ingestion of Xanax, Flexeril.  He also has history of anxiety and takes Xanax, takes Flexeril for back pain.  He was admitted for drug overdose.  He has been seen by psychiatry and recommended inpatient psych admission. Patient is hemodynamically stable for transfer to inpatient psych whenever bed is available.  Assessment & Plan:   Principal Problem:   Overdose of benzodiazepine, intentional self-harm, initial encounter ([place]) Active Problems:   Gastroesophageal reflux disease without esophagitis   Hypothyroidism   GAD (generalized anxiety disorder)   Severe major depression (HCC)   Severe episode of recurrent major depressive disorder, without psychotic features ([place])   Drug overdose   Intentional drug overdose: Ingested Xanax, Flexeril.  History of depression.  Wife indicated that he ingested the medications intentionally to kill himself.  He was very depressed since last week. Psychiatry following.  Bipolar disorder/bipolar: Started on Depakote to 250 mg TID.  Psychiatry following.  Check valproic acid level in 4 days.  Generalized anxiety disorder/Xanax withdrawal: Continue Librium detox protocol,monitor QTC.  Hypothyroidism: Continue Synthyroid  Fall: Associated with drug overdose.  I do not think he needs physical or occupational therapy at this point.  He has a bruise on his left periorbital area.  CT head did not show any acute intracranial abnormalities.  GERD: Continue Protonix  Hypernatremia: Could be assciated with dehydration.  Started on D5.           DVT prophylaxis:ScD Code Status:  Full Family Communication: Discussed with wife on phone on 01/26/20 Disposition Plan: Patient is from home.  He will be discharged to inpatient psych.He is medically stable for discharge to inpatient psych as soon as bed is available   Consultants: Psychiatry  Procedures:None  Antimicrobials:  Anti-infectives (From admission, onward)   None      Subjective: Patient seen and examined at the bedside.he was sedated, did not open his eyes on calling his name.  Hemodynamically stable.  Objective: Vitals:   01/26/20 0040 01/26/20 0525 01/26/20 0746 01/26/20 1213  BP: 135/82 133/90 127/81 126/79  Pulse: 62 75 67 77  Resp: 12 10 12 17   Temp: (!) 97.5 F (36.4 C)  97.6 F (36.4 C) 98 F (36.7 C)  TempSrc: Oral  Axillary Oral  SpO2: 100% 99% 100% 100%    Intake/Output Summary (Last 24 hours) at 01/26/2020 1254 Last data filed at 01/26/2020 1153 Gross per 24 hour  Intake 2692.5 ml  Output 500 ml  Net 2192.5 ml   There were no vitals filed for this visit.  Examination:  General exam: Sedated,Not in [place] around his left periorbital area from the fall Respiratory system: Bilateral equal air entry, normal vesicular breath sounds, no wheezes or crackles  Cardiovascular system: S1 & S2 heard, RRR. No JVD, murmurs, rubs, gallops or clicks. No pedal edema. Gastrointestinal system: Abdomen is nondistended, soft and nontender. No organomegaly or masses felt. Normal bowel sounds heard. Central nervous system: Not Alert and oriented. Extremities: No edema, no clubbing ,no cyanosis Skin: No rashes, lesions or ulcers,no icterus ,no pallor   Data Reviewed: I have personally reviewed following labs and imaging studies  CBC: Recent Labs  Lab 01/25/20 1806  01/25/20 2142  WBC 5.2  --   NEUTROABS 2.4  --   HGB 14.1 12.6*  HCT 43.2 37.0*  MCV 91.1  --   PLT 205  --    Basic Metabolic Panel: Recent Labs  Lab 01/25/20 1806 01/25/20 2142  NA 145 148*  K 4.4  3.8  CL 110  --   CO2 25  --   GLUCOSE 118*  --   BUN 10  --   CREATININE 0.99  --   CALCIUM 8.7*  --    GFR: CrCl cannot be calculated (Unknown ideal weight.). Liver Function Tests: Recent Labs  Lab 01/25/20 1806  AST 26  ALT 22  ALKPHOS 40  BILITOT 0.7  PROT 6.3*  ALBUMIN 3.7   No results for input(s): LIPASE, AMYLASE in the last 168 hours. No results for input(s): AMMONIA in the last 168 hours. Coagulation Profile: No results for input(s): INR, PROTIME in the last 168 hours. Cardiac Enzymes: No results for input(s): CKTOTAL, CKMB, CKMBINDEX, TROPONINI in the last 168 hours. BNP (last 3 results) No results for input(s): PROBNP in the last 8760 hours. HbA1C: No results for input(s): HGBA1C in the last 72 hours. CBG: No results for input(s): GLUCAP in the last 168 hours. Lipid Profile: No results for input(s): CHOL, HDL, LDLCALC, TRIG, CHOLHDL, LDLDIRECT in the last 72 hours. Thyroid Function Tests: Recent Labs    01/25/20 1816 01/25/20 1819  TSH 5.438*  --   FREET4  --  0.88   Anemia Panel: No results for input(s): VITAMINB12, FOLATE, FERRITIN, TIBC, IRON, RETICCTPCT in the last 72 hours. Sepsis Labs: No results for input(s): PROCALCITON, LATICACIDVEN in the last 168 hours.  Recent Results (from the past 240 hour(s))  Respiratory Panel by RT PCR (Flu A&B, Covid) - Nasopharyngeal Swab     Status: None   Collection Time: 01/25/20  6:29 PM   Specimen: Nasopharyngeal Swab  Result Value Ref Range Status   SARS Coronavirus 2 by RT PCR NEGATIVE NEGATIVE Final    Comment: (NOTE) SARS-CoV-2 target nucleic acids are NOT DETECTED. The SARS-CoV-2 RNA is generally detectable in upper respiratoy specimens during the acute phase of infection. The lowest concentration of SARS-CoV-2 viral copies this assay can detect is 131 copies/mL. A negative result does not preclude SARS-Cov-2 infection and should not be used as the sole basis for treatment or other patient management  decisions. A negative result may occur with  improper specimen collection/handling, submission of specimen other than nasopharyngeal swab, presence of viral mutation(s) within the areas targeted by this assay, and inadequate number of viral copies (<131 copies/mL). A negative result must be combined with clinical observations, patient history, and epidemiological information. The expected result is Negative. Fact Sheet for Patients:  PinkCheek.be Fact Sheet for Healthcare Providers:  GravelBags.it This test is not yet ap proved or cleared by the [place] FDA and  has been authorized for detection and/or diagnosis of SARS-CoV-2 by FDA under an Emergency Use Authorization (EUA). This EUA will remain  in effect (meaning this test can be used) for the duration of the COVID-19 declaration under Section 564(b)(1) of the Act, 21 U.S.C. section 360bbb-3(b)(1), unless the authorization is terminated or revoked sooner.    Influenza A by PCR NEGATIVE NEGATIVE Final   Influenza B by PCR NEGATIVE NEGATIVE Final    Comment: (NOTE) The Xpert Xpress SARS-CoV-2/FLU/RSV assay is intended as an aid in  the diagnosis of influenza from Nasopharyngeal swab specimens and  should not be  used as a sole basis for treatment. Nasal washings and  aspirates are unacceptable for Xpert Xpress SARS-CoV-2/FLU/RSV  testing. Fact Sheet for Patients: PinkCheek.be Fact Sheet for Healthcare Providers: GravelBags.it This test is not yet approved or cleared by the [place] FDA and  has been authorized for detection and/or diagnosis of SARS-CoV-2 by  FDA under an Emergency Use Authorization (EUA). This EUA will remain  in effect (meaning this test can be used) for the duration of the  Covid-19 declaration under Section 564(b)(1) of the Act, 21  U.S.C. section 360bbb-3(b)(1), unless the authorization  is  terminated or revoked. Performed at [facility], [place] [address], [place], [place] [address]          Radiology Studies: CT Head Wo Contrast  Result Date: 01/25/2020 CLINICAL DATA:  Fall out of bed EXAM: CT HEAD WITHOUT CONTRAST TECHNIQUE: Contiguous axial images were obtained from the base of the skull through the vertex without intravenous contrast. COMPARISON:  06/14/2019 FINDINGS: Brain: No acute intracranial abnormality. Specifically, no hemorrhage, hydrocephalus, mass lesion, acute infarction, or significant intracranial injury. Vascular: No hyperdense vessel or unexpected calcification. Skull: No acute calvarial abnormality. Sinuses/Orbits: No acute finding Other: None IMPRESSION: No acute intracranial abnormality. Electronically Signed   By: Rolm Baptise M.D.   On: 01/25/2020 22:56   CT Head Wo Contrast  Result Date: 01/25/2020 CLINICAL DATA:  [place]. Hit head. EXAM: CT HEAD WITHOUT CONTRAST CT CERVICAL SPINE WITHOUT CONTRAST TECHNIQUE: Multidetector CT imaging of the head and cervical spine was performed following the standard protocol without intravenous contrast. Multiplanar CT image reconstructions of the cervical spine were also generated. COMPARISON:  06/14/2019 FINDINGS: CT HEAD FINDINGS Examination limited by patient motion. Patient combative. Brain: The ventricles are normal in size and configuration. No extra-axial fluid collections are identified. The gray-white differentiation is maintained. No CT findings for acute hemispheric infarction or intracranial hemorrhage. No mass lesions. The brainstem and cerebellum are normal. Vascular: No hyperdense vessels or obvious aneurysm. Skull: No acute skull fracture. No bone lesion. Sinuses/Orbits: The paranasal sinuses and mastoid air cells are clear. The globes are intact. Other: No scalp lesions, laceration or hematoma. CT CERVICAL SPINE FINDINGS Alignment: Normal Skull base and vertebrae: Choose 1 Soft tissues and spinal canal:  Choose 1 Disc levels: The spinal canal is fairly generous. No significant spinal or foraminal stenosis. Upper chest: The lung apices are grossly clear. Other: No neck mass or adenopathy. IMPRESSION: 1. No acute intracranial findings or skull fracture. 2. Normal alignment of the cervical vertebral bodies and no acute fracture. Electronically Signed   By: Marijo Sanes M.D.   On: 01/25/2020 19:45   CT Cervical Spine Wo Contrast  Result Date: 01/25/2020 CLINICAL DATA:  [place]. Hit head. EXAM: CT HEAD WITHOUT CONTRAST CT CERVICAL SPINE WITHOUT CONTRAST TECHNIQUE: Multidetector CT imaging of the head and cervical spine was performed following the standard protocol without intravenous contrast. Multiplanar CT image reconstructions of the cervical spine were also generated. COMPARISON:  06/14/2019 FINDINGS: CT HEAD FINDINGS Examination limited by patient motion. Patient combative. Brain: The ventricles are normal in size and configuration. No extra-axial fluid collections are identified. The gray-white differentiation is maintained. No CT findings for acute hemispheric infarction or intracranial hemorrhage. No mass lesions. The brainstem and cerebellum are normal. Vascular: No hyperdense vessels or obvious aneurysm. Skull: No acute skull fracture. No bone lesion. Sinuses/Orbits: The paranasal sinuses and mastoid air cells are clear. The globes are intact. Other: No scalp lesions, laceration or hematoma. CT  CERVICAL SPINE FINDINGS Alignment: Normal Skull base and vertebrae: Choose 1 Soft tissues and spinal canal: Choose 1 Disc levels: The spinal canal is fairly generous. No significant spinal or foraminal stenosis. Upper chest: The lung apices are grossly clear. Other: No neck mass or adenopathy. IMPRESSION: 1. No acute intracranial findings or skull fracture. 2. Normal alignment of the cervical vertebral bodies and no acute fracture. Electronically Signed   By: Marijo Sanes M.D.   On: 01/25/2020 19:45   DG Chest  Portable 1 View  Result Date: 01/25/2020 CLINICAL DATA:  Weakness. Drug overdose. EXAM: PORTABLE CHEST 1 VIEW COMPARISON:  None. FINDINGS: Low lung volumes. Upper normal heart size likely accentuated by portable technique and low lung volumes. Minimal bibasilar atelectasis. Pulmonary vasculature is normal. No consolidation, pleural effusion, or pneumothorax. No acute osseous abnormalities are seen. IMPRESSION: Low lung volumes with minimal bibasilar atelectasis. Electronically Signed   By: Keith Rake M.D.   On: 01/25/2020 18:46        Scheduled Meds: . divalproex  250 mg Oral TID  . multivitamin with minerals  1 tablet Oral Daily  . thiamine  100 mg Oral Daily   Continuous Infusions:   LOS: 0 days    Time spent: 35 mins.More than 50% of that time was spent in counseling and/or coordination of care.      Shelly Coss, MD [facility] P2/09/2020, 12:54 PM

## 2020-01-26 NOTE — Consult Note (Signed)
   Spoke with patients' nurse Carroll Kinds, RN to set up psychiatry consult with Leodis Rains; Alyse Low, RN states that patient is really lethargic and can only get him to open his eyes but not responding verbally at this time.  States when the patient wakes she will call me at [phone] to set up psychiatric consult.

## 2020-01-26 NOTE — Consult Note (Addendum)
Telepsych Consultation   Reason for Consult:  Suicide attempt via overdose Referring Physician:  Dr. Toy Baker (hospitalist) Location of Patient: [place] Location of Provider: [facility]  Patient Identification: Marvin Espinoza MRN:  [id number] Principal Diagnosis: Overdose of benzodiazepine, intentional self-harm, initial encounter [facility]) Diagnosis:  Principal Problem:   Overdose of benzodiazepine, intentional self-harm, initial encounter ([place]) Active Problems:   Gastroesophageal reflux disease without esophagitis   Hypothyroidism   GAD (generalized anxiety disorder)   Severe major depression ([place])   MDD (major depressive disorder), recurrent severe, without psychosis ([place])   Drug overdose   Total Time spent with patient: 45 minutes  Subjective:   Marvin Espinoza is a 49 y.o. male patient admitted to hospital after and overdose of Xanax and Flexeril.  HPI:  Marvin Espinoza, 49 y.o., male patient seen via tele psych by this provider, consulted with Dr. Dwyane Dee; and chart reviewed on 01/26/20.  On evaluation Marvin Espinoza reports he was brought to the hospital because he took to much Xanax.  Patient states that it was not a suicide attempt but when questioned further he admitted to suicide attempt.  Patient states that he has had 2 prior suicide attempts and was hospitalized.  Patient states that he does have depression that has been worsening with stressor being the recent death of his mother 2 months ago and trying to get her things cleaned out and packed up.  States that he is also having other problems at home but would not elaborate.  Patient states that he has outpatient psychiatric services with Dr. Toy Care.   During evaluation Marvin Espinoza is alert/oriented x 4; calm/cooperative; and mood is congruent with affect.  He does not appear to be responding to internal/external stimuli or delusional thoughts.  At this time patient denies suicidal/self-harm/homicidal  ideation, psychosis, and paranoia; but patient minimizing circumstance of overdose and giving false information at first.  Patient does have a prior history of suicide attempt.  Patient recommended for inpatient psychiatric services..      Past Psychiatric History: Prior suicide attempt x 2, Major Depressive disorder  Risk to Self:   Yes Risk to Others:  No Prior Inpatient Therapy:  Yes Prior Outpatient Therapy:  Yes  Past Medical History:  Past Medical History:  Diagnosis Date  . Anxiety   . Bipolar disorder ([place])   . CTS (carpal tunnel syndrome)   . Depression   . GERD (gastroesophageal reflux disease)   . H/O bariatric surgery   . Head injury 05/06/2017  . Hypothyroidism   . Hypothyroidism   . Sleep apnea    no need for CPAP at this time  . Sleep apnea     Past Surgical History:  Procedure Laterality Date  . ANAL FISSURE REPAIR    . BARIATRIC SURGERY    . CARPAL TUNNEL RELEASE Left 05/09/2015   Procedure: LEFT CARPAL TUNNEL RELEASE;  Surgeon: Daryll Brod, MD;  Location: [place];  Service: Orthopedics;  Laterality: Left;  . CARPAL TUNNEL RELEASE Left 05-09-2015  . CARPAL TUNNEL RELEASE Right 06/20/2015   Procedure: RIGHT CARPAL TUNNEL RELEASE;  Surgeon: Daryll Brod, MD;  Location: [place];  Service: Orthopedics;  Laterality: Right;  REGIONAL/FAB  . CHOLECYSTECTOMY    . COLONOSCOPY    . UPPER GASTROINTESTINAL ENDOSCOPY     Family History:  Family History  Problem Relation Age of Onset  . Diabetes Mother   . Heart disease Mother   . Depression Mother   . Esophageal  cancer Mother   . COPD Father   . Heart disease Father   . Kidney disease Father   . Mental illness Father   . Depression Father   . Mental illness Sister   . Depression Sister   . Esophageal cancer Maternal Uncle   . Colon cancer Neg Hx   . Rectal cancer Neg Hx   . Stomach cancer Neg Hx    Family Psychiatric  History: See above Social History:  Social History    Substance and Sexual Activity  Alcohol Use No     Social History   Substance and Sexual Activity  Drug Use No    Social History   Socioeconomic History  . Marital status: Married    Spouse name: Not on file  . Number of children: 1  . Years of education: Not on file  . Highest education level: Not on file  Occupational History  . Not on file  Tobacco Use  . Smoking status: Never Smoker  . Smokeless tobacco: Never Used  Substance and Sexual Activity  . Alcohol use: No  . Drug use: No  . Sexual activity: Yes  Other Topics Concern  . Not on file  Social History Narrative   ** Merged History Encounter **       Social Determinants of Health   Financial Resource Strain:   . Difficulty of Paying Living Expenses: Not on file  Food Insecurity:   . Worried About Charity fundraiser in the Last Year: Not on file  . Ran Out of Food in the Last Year: Not on file  Transportation Needs:   . Lack of Transportation (Medical): Not on file  . Lack of Transportation (Non-Medical): Not on file  Physical Activity:   . Days of Exercise per Week: Not on file  . Minutes of Exercise per Session: Not on file  Stress:   . Feeling of Stress : Not on file  Social Connections:   . Frequency of Communication with Friends and Family: Not on file  . Frequency of Social Gatherings with Friends and Family: Not on file  . Attends Religious Services: Not on file  . Active Member of Clubs or Organizations: Not on file  . Attends Archivist Meetings: Not on file  . Marital Status: Not on file   Additional Social History:    Allergies:   Allergies  Allergen Reactions  . Aripiprazole Anaphylaxis and Swelling    "slurred speech"   . Cariprazine Hcl Other (See Comments)  . Latuda  [Lurasidone Hcl] Other (See Comments)  . Seroquel [Quetiapine Fumarate]     tardive dyskinesia  . Alcohol Itching    Drinking alcohol  . Cariprazine Other (See Comments)    Tardive dyskenesia  .  Hydrocodone-Acetaminophen Itching  . Lurasidone Other (See Comments)    dysconesia  . Victoza [Liraglutide] Other (See Comments)    Severe heartburn   . Victoza [Liraglutide]     Severe heartburn  . Bupropion Rash  . Risperidone Rash    Labs:  Results for orders placed or performed during the hospital encounter of 01/25/20 (from the past 48 hour(s))  CBC with Differential/Platelet     Status: None   Collection Time: 01/25/20  6:06 PM  Result Value Ref Range   WBC 5.2 4.0 - 10.5 K/uL   RBC 4.74 4.22 - 5.81 MIL/uL   Hemoglobin 14.1 13.0 - 17.0 g/dL   HCT 43.2 39.0 - 52.0 %   MCV 91.1 80.0 -  100.0 fL   MCH 29.7 26.0 - 34.0 pg   MCHC 32.6 30.0 - 36.0 g/dL   RDW 13.5 11.5 - 15.5 %   Platelets 205 150 - 400 K/uL   nRBC 0.0 0.0 - 0.2 %   Neutrophils Relative % 47 %   Neutro Abs 2.4 1.7 - 7.7 K/uL   Lymphocytes Relative 40 %   Lymphs Abs 2.1 0.7 - 4.0 K/uL   Monocytes Relative 9 %   Monocytes Absolute 0.5 0.1 - 1.0 K/uL   Eosinophils Relative 3 %   Eosinophils Absolute 0.2 0.0 - 0.5 K/uL   Basophils Relative 1 %   Basophils Absolute 0.1 0.0 - 0.1 K/uL   Immature Granulocytes 0 %   Abs Immature Granulocytes 0.02 0.00 - 0.07 K/uL    Comment: Performed at [facility] [address], [place], [place] [address]  Comprehensive metabolic panel     Status: Abnormal   Collection Time: 01/25/20  6:06 PM  Result Value Ref Range   Sodium 145 135 - 145 mmol/L   Potassium 4.4 3.5 - 5.1 mmol/L   Chloride 110 98 - 111 mmol/L   CO2 25 22 - 32 mmol/L   Glucose, Bld 118 (H) 70 - 99 mg/dL   BUN 10 6 - 20 mg/dL   Creatinine, Ser 0.99 0.61 - 1.24 mg/dL   Calcium 8.7 (L) 8.9 - 10.3 mg/dL   Total Protein 6.3 (L) 6.5 - 8.1 g/dL   Albumin 3.7 3.5 - 5.0 g/dL   AST 26 15 - 41 U/L   ALT 22 0 - 44 U/L   Alkaline Phosphatase 40 38 - 126 U/L   Total Bilirubin 0.7 0.3 - 1.2 mg/dL   GFR calc non Af Amer >60 >60 mL/min   GFR calc Af Amer >60 >60 mL/min   Anion gap 10 5 - 15    Comment:  Performed at [facility] [address] [place], [place] [address]  Acetaminophen level     Status: Abnormal   Collection Time: 01/25/20  6:06 PM  Result Value Ref Range   Acetaminophen (Tylenol), Serum <10 (L) 10 - 30 ug/mL    Comment: (NOTE) Therapeutic concentrations vary significantly. A range of 10-30 ug/mL  may be an effective concentration for many patients. However, some  are best treated at concentrations outside of this range. Acetaminophen concentrations >150 ug/mL at 4 hours after ingestion  and >50 ug/mL at 12 hours after ingestion are often associated with  toxic reactions. Performed at [facility], [place] [address], [place], [place] [id number]   Salicylate level     Status: Abnormal   Collection Time: 01/25/20  6:06 PM  Result Value Ref Range   Salicylate Lvl Q000111Q (L) 7.0 - 30.0 mg/dL    Comment: Performed at [facility] [address] [place], [place] [address]  Valproic acid level     Status: Abnormal   Collection Time: 01/25/20  6:16 PM  Result Value Ref Range   Valproic Acid Lvl 16 (L) 50.0 - 100.0 ug/mL    Comment: Performed at [facility] [address], [place], [place] [address]  TSH     Status: Abnormal   Collection Time: 01/25/20  6:16 PM  Result Value Ref Range   TSH 5.438 (H) 0.350 - 4.500 uIU/mL    Comment: Performed by a 3rd Generation assay with a functional sensitivity of <=0.01 uIU/mL. Performed at [facility], [address] [place], [place] [address]   T4, free     Status: None   Collection Time: 01/25/20  6:19 PM  Result Value Ref Range   Free T4 0.88 0.61 - 1.12 ng/dL    Comment: (NOTE) Biotin ingestion may interfere with free T4 tests. If the results are inconsistent with the TSH level, previous test results, or the clinical presentation, then consider biotin interference. If needed, order repeat testing after stopping biotin. Performed at [facility], [place] [address], [place],  [place] [address]   Respiratory Panel by RT PCR (Flu A&B, Covid) - Nasopharyngeal Swab     Status: None   Collection Time: 01/25/20  6:29 PM   Specimen: Nasopharyngeal Swab  Result Value Ref Range   SARS Coronavirus 2 by RT PCR NEGATIVE NEGATIVE    Comment: (NOTE) SARS-CoV-2 target nucleic acids are NOT DETECTED. The SARS-CoV-2 RNA is generally detectable in upper respiratoy specimens during the acute phase of infection. The lowest concentration of SARS-CoV-2 viral copies this assay can detect is 131 copies/mL. A negative result does not preclude SARS-Cov-2 infection and should not be used as the sole basis for treatment or other patient management decisions. A negative result may occur with  improper specimen collection/handling, submission of specimen other than nasopharyngeal swab, presence of viral mutation(s) within the areas targeted by this assay, and inadequate number of viral copies (<131 copies/mL). A negative result must be combined with clinical observations, patient history, and epidemiological information. The expected result is Negative. Fact Sheet for Patients:  PinkCheek.be Fact Sheet for Healthcare Providers:  GravelBags.it This test is not yet ap proved or cleared by the [place] FDA and  has been authorized for detection and/or diagnosis of SARS-CoV-2 by FDA under an Emergency Use Authorization (EUA). This EUA will remain  in effect (meaning this test can be used) for the duration of the COVID-19 declaration under Section 564(b)(1) of the Act, 21 U.S.C. section 360bbb-3(b)(1), unless the authorization is terminated or revoked sooner.    Influenza A by PCR NEGATIVE NEGATIVE   Influenza B by PCR NEGATIVE NEGATIVE    Comment: (NOTE) The Xpert Xpress SARS-CoV-2/FLU/RSV assay is intended as an aid in  the diagnosis of influenza from Nasopharyngeal swab specimens and  should not be used as a sole basis for  treatment. Nasal washings and  aspirates are unacceptable for Xpert Xpress SARS-CoV-2/FLU/RSV  testing. Fact Sheet for Patients: PinkCheek.be Fact Sheet for Healthcare Providers: GravelBags.it This test is not yet approved or cleared by the [place] FDA and  has been authorized for detection and/or diagnosis of SARS-CoV-2 by  FDA under an Emergency Use Authorization (EUA). This EUA will remain  in effect (meaning this test can be used) for the duration of the  Covid-19 declaration under Section 564(b)(1) of the Act, 21  U.S.C. section 360bbb-3(b)(1), unless the authorization is  terminated or revoked. Performed at [facility], [place] [address] [place], [place] [address]   Ethanol     Status: None   Collection Time: 01/25/20  9:01 PM  Result Value Ref Range   Alcohol, Ethyl (B) <10 <10 mg/dL    Comment: (NOTE) Lowest detectable limit for serum alcohol is 10 mg/dL. For medical purposes only. Performed at [facility], [place] [address], [place], [place] [address]   Acetaminophen level     Status: Abnormal   Collection Time: 01/25/20  9:01 PM  Result Value Ref Range   Acetaminophen (Tylenol), Serum <10 (  L) 10 - 30 ug/mL    Comment: (NOTE) Therapeutic concentrations vary significantly. A range of 10-30 ug/mL  may be an effective concentration for many patients. However, some  are best treated at concentrations outside of this range. Acetaminophen concentrations >150 ug/mL at 4 hours after ingestion  and >50 ug/mL at 12 hours after ingestion are often associated with  toxic reactions. Performed at [facility], [place] [address], [place], [place] [address]   POCT I-Stat EG7     Status: Abnormal   Collection Time: 01/25/20  9:42 PM  Result Value Ref Range   pH, Ven 7.302 7.250 - 7.430   pCO2, Ven 49.9 44.0 - 60.0 mmHg   pO2, Ven 42.0 32.0 - 45.0 mmHg   Bicarbonate 24.7 20.0 - 28.0 mmol/L   TCO2 26 22 -  32 mmol/L   O2 Saturation 72.0 %   Acid-base deficit 2.0 0.0 - 2.0 mmol/L   Sodium 148 (H) 135 - 145 mmol/L   Potassium 3.8 3.5 - 5.1 mmol/L   Calcium, Ion 1.11 (L) 1.15 - 1.40 mmol/L   HCT 37.0 (L) 39.0 - 52.0 %   Hemoglobin 12.6 (L) 13.0 - 17.0 g/dL   Patient temperature HIDE    Sample type VENOUS     Medications:  No current facility-administered medications for this encounter.    Musculoskeletal: Strength & Muscle Tone: within normal limits Gait & Station: normal Patient leans: N/A  Psychiatric Specialty Exam: Physical Exam  Nursing note and vitals reviewed. Psychiatric: His mood appears anxious. He expresses impulsivity. He exhibits a depressed mood.    Review of Systems  Psychiatric/Behavioral: Hallucinations: denies. Self-injury: Denies. Suicidal ideas: Denies at this time. The patient is nervous/anxious.   All other systems reviewed and are negative.   Blood pressure 127/81, pulse 67, temperature 97.6 F (36.4 C), temperature source Axillary, resp. rate 12, SpO2 100 %.There is no height or weight on file to calculate BMI.  General Appearance: Unable to see patient camera not working  [employer]:  Unable to see patient camera not working  Speech:  Clear and Coherent  Volume:  Normal  Mood:  Depressed  Affect:  Congruent  Thought Process:  Coherent, Linear and Descriptions of Associations: Intact  Orientation:  Full (Time, Place, and Person)  Thought Content:  WDL  Suicidal Thoughts:  Denies at this time.. Suicide attempt via overdose  Homicidal Thoughts:  No  Memory:  Immediate;   Good Recent;   Good  Judgement:  Impaired  Insight:  Lacking  Psychomotor Activity:  Decreased  Concentration:  Concentration: Fair and Attention Span: Fair  Recall:  Good  Fund of Knowledge:  Good  Language:  Good  Akathisia:  No  Handed:  Right  AIMS (if indicated):     Assets:  Communication Skills Desire for Improvement Housing Social Support  ADL's:  Intact   Cognition:  WNL  Sleep:        Treatment Plan Summary: Medication management and Plan Inpatient psychiatric treatment   Recommendation:  Restart Depakote 250 Tid, Librium detox protocol for Xanax withdrawal.  Monitor QTc, Recheck Valproic acid in 4 days.  Inpatient psychiatric treatment when medically cleared.    Disposition: Recommend psychiatric Inpatient admission when medically cleared.  This service was provided via telemedicine using a 2-way, interactive audio and video technology.  Names of all persons participating in this telemedicine service and their role in this encounter. Name: Earleen Newport Role: NP  Name: Dr. Dwyane Dee Role: Psychiatrist  Name:  Marvin Espinoza Role: Patient   Name: Dr. Tawanna Solo Name: Carroll Kinds Role: Hospitalist Role: RN    Messaged Dr. Tawanna Solo: Psychiatry consult complete:  Recommending restart Depakote 250 Tid, Librium detox protocol for Xanax withdrawal.  Monitor QTc, Recheck Valproic acid in 4 days.  Inpatient psychiatric treatment when medically cleared.   Orders entered for Librium protocol, Depakote, and Valproic acid level.)  Social work consult sent for referral inpatient psychiatric treatment when medically cleared.    Maurie Musco, NP 01/26/2020 11:27 AM

## 2020-01-26 NOTE — Progress Notes (Signed)
PT Cancellation Note  Patient Details Name: Cadyn Shadowens MRN: [id number] DOB: 09/25/1971   Cancelled Treatment:    Reason Eval/Treat Not Completed: Patient's level of consciousness;Other (comment) per chart review patient has been extremely agitated and combative, spoke with RN who reports he has received meds to address behaviors and is not appropriate to participate today. Will check back tomorrow.    Windell Norfolk, DPT, PN1   Supplemental Physical Therapist [facility]    Pager [phone] Acute Rehab Office [phone]

## 2020-01-26 NOTE — Discharge Summary (Signed)
Physician Discharge Summary  Marvin Espinoza [id number] DOB: Apr 21, 1971 DOA: 01/25/2020  PCP: Terald Sleeper, PA-C  Admit date: 01/25/2020 Discharge date: 01/26/2020  Admitted From: Home Disposition:  Home  Discharge Condition:Stable CODE STATUS:FULL Diet recommendation: Regular   Brief/Interim Summary:  Patient is a 49 year old male with history of GERD, neuropathy, degenerative disc disease, diabetes, hypothyroidism, depression, bipolar disorder, history of bariatric surgery, sleep apnea but not on CPAP, chronic back pain who presented with intentional ingestion of Xanax, Flexeril.  He also has history of anxiety and takes Xanax, takes Flexeril for back pain.  He was admitted for drug overdose.  He has been seen by psychiatry and recommended inpatient psych admission. Patient is hemodynamically stable for transfer to inpatient psych whenever bed is available.  Following problems were addressed during his hospitalization:  Intentional drug overdose: Ingested Xanax, Flexeril.  History of depression.  Wife indicated that he ingested the medications intentionally to kill himself.  He was very depressed since last week. Psychiatry following.  Bipolar disorder/bipolar: Started on Depakote to 250 mg TID.  Psychiatry following.  Check valproic acid level in 4 days.  Generalized anxiety disorder/Xanax withdrawal: Continue Librium detox protocol,monitor QTC.  Hypothyroidism: Continue Synthyroid  Fall: Associated with drug overdose.  I do not think he needs physical or occupational therapy at this point.  He has a bruise on his left periorbital area.  CT head did not show any acute intracranial abnormalities.  GERD: Continue PPI  Hypernatremia: Mild.     Discharge Diagnoses:  Principal Problem:   Overdose of benzodiazepine, intentional self-harm, initial encounter [facility]) Active Problems:   Gastroesophageal reflux disease without esophagitis   Hypothyroidism   GAD (generalized  anxiety disorder)   Severe major depression (HCC)   Severe episode of recurrent major depressive disorder, without psychotic features [facility])   Drug overdose    Discharge Instructions  Discharge Instructions    Diet general   Complete by: As directed    Increase activity slowly   Complete by: As directed      Allergies as of 01/26/2020      Reactions   Aripiprazole Anaphylaxis, Swelling   "slurred speech"   Cariprazine Hcl Other (See Comments)   Latuda  [lurasidone Hcl] Other (See Comments)   Seroquel [quetiapine Fumarate]    tardive dyskinesia   Alcohol Itching   Drinking alcohol   Cariprazine Other (See Comments)   Tardive dyskenesia   Hydrocodone-acetaminophen Itching   Lurasidone Other (See Comments)   dysconesia   Victoza [liraglutide] Other (See Comments)   Severe heartburn   Victoza [liraglutide]    Severe heartburn   Bupropion Rash   Risperidone Rash      Medication List    STOP taking these medications   ALPRAZolam 1 MG tablet Commonly known as: XANAX   cyclobenzaprine 10 MG tablet Commonly known as: FLEXERIL   promethazine 25 MG tablet Commonly known as: PHENERGAN     TAKE these medications   CALCIUM 1200 PO Take 1 tablet by mouth 2 (two) times daily.   Carbonyl Iron 15 MG Chew Chew 1 tablet by mouth daily.   chlordiazePOXIDE 25 MG capsule Commonly known as: LIBRIUM Take 1 capsule (25 mg total) by mouth every 6 (six) hours as needed for withdrawal (CIWA score > 10).   divalproex 250 MG DR tablet Commonly known as: DEPAKOTE Take 1 tablet (250 mg total) by mouth 3 (three) times daily. What changed:   medication strength  how much to take  when to take  this   gabapentin 300 MG capsule Commonly known as: NEURONTIN TAKE 1 TO 2 CAPSULES AT BEDTIME What changed: See the new instructions.   levothyroxine 100 MCG tablet Commonly known as: SYNTHROID TAKE ONE (1) TABLET EACH DAY What changed:   how much to take  how to take  this  when to take this  additional instructions   lubiprostone 24 MCG capsule Commonly known as: Amitiza TAKE 1 CAPSULE TWICE DAILY WITH A MEAL What changed:   how much to take  how to take this  when to take this  additional instructions   multivitamin with minerals Tabs tablet Take 1 tablet by mouth 2 (two) times daily. For Vitamin supplementation   omeprazole 40 MG capsule Commonly known as: PRILOSEC Take 1 capsule (40 mg total) by mouth 2 (two) times daily. TAKE ONE (1) CAPSULE EACH DAY What changed: additional instructions   thiamine 100 MG tablet Take 1 tablet (100 mg total) by mouth daily. Start taking on: January 27, 2020   vitamin B-12 250 MCG tablet Commonly known as: CYANOCOBALAMIN Take 250 mcg by mouth daily.       Allergies  Allergen Reactions  . Aripiprazole Anaphylaxis and Swelling    "slurred speech"   . Cariprazine Hcl Other (See Comments)  . Latuda  [Lurasidone Hcl] Other (See Comments)  . Seroquel [Quetiapine Fumarate]     tardive dyskinesia  . Alcohol Itching    Drinking alcohol  . Cariprazine Other (See Comments)    Tardive dyskenesia  . Hydrocodone-Acetaminophen Itching  . Lurasidone Other (See Comments)    dysconesia  . Victoza [Liraglutide] Other (See Comments)    Severe heartburn   . Victoza [Liraglutide]     Severe heartburn  . Bupropion Rash  . Risperidone Rash    Consultations: Psychaitry  Procedures/Studies: CT Head Wo Contrast  Result Date: 01/25/2020 CLINICAL DATA:  Fall out of bed EXAM: CT HEAD WITHOUT CONTRAST TECHNIQUE: Contiguous axial images were obtained from the base of the skull through the vertex without intravenous contrast. COMPARISON:  06/14/2019 FINDINGS: Brain: No acute intracranial abnormality. Specifically, no hemorrhage, hydrocephalus, mass lesion, acute infarction, or significant intracranial injury. Vascular: No hyperdense vessel or unexpected calcification. Skull: No acute calvarial abnormality.  Sinuses/Orbits: No acute finding Other: None IMPRESSION: No acute intracranial abnormality. Electronically Signed   By: Rolm Baptise M.D.   On: 01/25/2020 22:56   CT Head Wo Contrast  Result Date: 01/25/2020 CLINICAL DATA:  [place]. Hit head. EXAM: CT HEAD WITHOUT CONTRAST CT CERVICAL SPINE WITHOUT CONTRAST TECHNIQUE: Multidetector CT imaging of the head and cervical spine was performed following the standard protocol without intravenous contrast. Multiplanar CT image reconstructions of the cervical spine were also generated. COMPARISON:  06/14/2019 FINDINGS: CT HEAD FINDINGS Examination limited by patient motion. Patient combative. Brain: The ventricles are normal in size and configuration. No extra-axial fluid collections are identified. The gray-white differentiation is maintained. No CT findings for acute hemispheric infarction or intracranial hemorrhage. No mass lesions. The brainstem and cerebellum are normal. Vascular: No hyperdense vessels or obvious aneurysm. Skull: No acute skull fracture. No bone lesion. Sinuses/Orbits: The paranasal sinuses and mastoid air cells are clear. The globes are intact. Other: No scalp lesions, laceration or hematoma. CT CERVICAL SPINE FINDINGS Alignment: Normal Skull base and vertebrae: Choose 1 Soft tissues and spinal canal: Choose 1 Disc levels: The spinal canal is fairly generous. No significant spinal or foraminal stenosis. Upper chest: The lung apices are grossly clear. Other: No neck mass or adenopathy.  IMPRESSION: 1. No acute intracranial findings or skull fracture. 2. Normal alignment of the cervical vertebral bodies and no acute fracture. Electronically Signed   By: Marijo Sanes M.D.   On: 01/25/2020 19:45   CT Cervical Spine Wo Contrast  Result Date: 01/25/2020 CLINICAL DATA:  [place]. Hit head. EXAM: CT HEAD WITHOUT CONTRAST CT CERVICAL SPINE WITHOUT CONTRAST TECHNIQUE: Multidetector CT imaging of the head and cervical spine was performed following the standard  protocol without intravenous contrast. Multiplanar CT image reconstructions of the cervical spine were also generated. COMPARISON:  06/14/2019 FINDINGS: CT HEAD FINDINGS Examination limited by patient motion. Patient combative. Brain: The ventricles are normal in size and configuration. No extra-axial fluid collections are identified. The gray-white differentiation is maintained. No CT findings for acute hemispheric infarction or intracranial hemorrhage. No mass lesions. The brainstem and cerebellum are normal. Vascular: No hyperdense vessels or obvious aneurysm. Skull: No acute skull fracture. No bone lesion. Sinuses/Orbits: The paranasal sinuses and mastoid air cells are clear. The globes are intact. Other: No scalp lesions, laceration or hematoma. CT CERVICAL SPINE FINDINGS Alignment: Normal Skull base and vertebrae: Choose 1 Soft tissues and spinal canal: Choose 1 Disc levels: The spinal canal is fairly generous. No significant spinal or foraminal stenosis. Upper chest: The lung apices are grossly clear. Other: No neck mass or adenopathy. IMPRESSION: 1. No acute intracranial findings or skull fracture. 2. Normal alignment of the cervical vertebral bodies and no acute fracture. Electronically Signed   By: Marijo Sanes M.D.   On: 01/25/2020 19:45   DG Chest Portable 1 View  Result Date: 01/25/2020 CLINICAL DATA:  Weakness. Drug overdose. EXAM: PORTABLE CHEST 1 VIEW COMPARISON:  None. FINDINGS: Low lung volumes. Upper normal heart size likely accentuated by portable technique and low lung volumes. Minimal bibasilar atelectasis. Pulmonary vasculature is normal. No consolidation, pleural effusion, or pneumothorax. No acute osseous abnormalities are seen. IMPRESSION: Low lung volumes with minimal bibasilar atelectasis. Electronically Signed   By: Keith Rake M.D.   On: 01/25/2020 18:46       Subjective:  Patient is hemodynamically stable for discharge   Discharge Exam:  Vitals:   01/26/20 0746  01/26/20 1213  BP: 127/81 126/79  Pulse: 67 77  Resp: 12 17  Temp: 97.6 F (36.4 C) 98 F (36.7 C)  SpO2: 100% 100%   Vitals:   01/26/20 0040 01/26/20 0525 01/26/20 0746 01/26/20 1213  BP: 135/82 133/90 127/81 126/79  Pulse: 62 75 67 77  Resp: 12 10 12 17   Temp: (!) 97.5 F (36.4 C)  97.6 F (36.4 C) 98 F (36.7 C)  TempSrc: Oral  Axillary Oral  SpO2: 100% 99% 100% 100%    General: Not in distress Cardiovascular: RRR, S1/S2 +, no rubs, no gallops Respiratory: CTA bilaterally, no wheezing, no rhonchi Abdominal: Soft, NT, ND, bowel sounds + Extremities: no edema, no cyanosis    The results of significant diagnostics from this hospitalization (including imaging, microbiology, ancillary and laboratory) are listed below for reference.     Microbiology: Recent Results (from the past 240 hour(s))  Respiratory Panel by RT PCR (Flu A&B, Covid) - Nasopharyngeal Swab     Status: None   Collection Time: 01/25/20  6:29 PM   Specimen: Nasopharyngeal Swab  Result Value Ref Range Status   SARS Coronavirus 2 by RT PCR NEGATIVE NEGATIVE Final    Comment: (NOTE) SARS-CoV-2 target nucleic acids are NOT DETECTED. The SARS-CoV-2 RNA is generally detectable in upper respiratoy specimens during the acute  phase of infection. The lowest concentration of SARS-CoV-2 viral copies this assay can detect is 131 copies/mL. A negative result does not preclude SARS-Cov-2 infection and should not be used as the sole basis for treatment or other patient management decisions. A negative result may occur with  improper specimen collection/handling, submission of specimen other than nasopharyngeal swab, presence of viral mutation(s) within the areas targeted by this assay, and inadequate number of viral copies (<131 copies/mL). A negative result must be combined with clinical observations, patient history, and epidemiological information. The expected result is Negative. Fact Sheet for Patients:   PinkCheek.be Fact Sheet for Healthcare Providers:  GravelBags.it This test is not yet ap proved or cleared by the [place] FDA and  has been authorized for detection and/or diagnosis of SARS-CoV-2 by FDA under an Emergency Use Authorization (EUA). This EUA will remain  in effect (meaning this test can be used) for the duration of the COVID-19 declaration under Section 564(b)(1) of the Act, 21 U.S.C. section 360bbb-3(b)(1), unless the authorization is terminated or revoked sooner.    Influenza A by PCR NEGATIVE NEGATIVE Final   Influenza B by PCR NEGATIVE NEGATIVE Final    Comment: (NOTE) The Xpert Xpress SARS-CoV-2/FLU/RSV assay is intended as an aid in  the diagnosis of influenza from Nasopharyngeal swab specimens and  should not be used as a sole basis for treatment. Nasal washings and  aspirates are unacceptable for Xpert Xpress SARS-CoV-2/FLU/RSV  testing. Fact Sheet for Patients: PinkCheek.be Fact Sheet for Healthcare Providers: GravelBags.it This test is not yet approved or cleared by the [place] FDA and  has been authorized for detection and/or diagnosis of SARS-CoV-2 by  FDA under an Emergency Use Authorization (EUA). This EUA will remain  in effect (meaning this test can be used) for the duration of the  Covid-19 declaration under Section 564(b)(1) of the Act, 21  U.S.C. section 360bbb-3(b)(1), unless the authorization is  terminated or revoked. Performed at [facility], [place] [address], [place], [place] [address]      Labs: BNP (last 3 results) No results for input(s): BNP in the last 8760 hours. Basic Metabolic Panel: Recent Labs  Lab 01/25/20 1806 01/25/20 2142 01/26/20 1318  NA 145 148*  --   K 4.4 3.8  --   CL 110  --   --   CO2 25  --   --   GLUCOSE 118*  --   --   BUN 10  --   --   CREATININE 0.99  --   --   CALCIUM  8.7*  --   --   MG  --   --  2.1  PHOS  --   --  3.8   Liver Function Tests: Recent Labs  Lab 01/25/20 1806  AST 26  ALT 22  ALKPHOS 40  BILITOT 0.7  PROT 6.3*  ALBUMIN 3.7   No results for input(s): LIPASE, AMYLASE in the last 168 hours. No results for input(s): AMMONIA in the last 168 hours. CBC: Recent Labs  Lab 01/25/20 1806 01/25/20 2142  WBC 5.2  --   NEUTROABS 2.4  --   HGB 14.1 12.6*  HCT 43.2 37.0*  MCV 91.1  --   PLT 205  --    Cardiac Enzymes: No results for input(s): CKTOTAL, CKMB, CKMBINDEX, TROPONINI in the last 168 hours. BNP: Invalid input(s): POCBNP CBG: No results for input(s): GLUCAP in the last 168 hours. D-Dimer No results for input(s): DDIMER in the  last 72 hours. Hgb A1c No results for input(s): HGBA1C in the last 72 hours. Lipid Profile No results for input(s): CHOL, HDL, LDLCALC, TRIG, CHOLHDL, LDLDIRECT in the last 72 hours. Thyroid function studies Recent Labs    01/25/20 1816  TSH 5.438*   Anemia work up No results for input(s): VITAMINB12, FOLATE, FERRITIN, TIBC, IRON, RETICCTPCT in the last 72 hours. Urinalysis No results found for: COLORURINE, APPEARANCEUR, [place], [place], [place], [place], [place], [place], PROTEINUR, UROBILINOGEN, NITRITE, LEUKOCYTESUR Sepsis Labs Invalid input(s): PROCALCITONIN,  WBC,  LACTICIDVEN Microbiology Recent Results (from the past 240 hour(s))  Respiratory Panel by RT PCR (Flu A&B, Covid) - Nasopharyngeal Swab     Status: None   Collection Time: 01/25/20  6:29 PM   Specimen: Nasopharyngeal Swab  Result Value Ref Range Status   SARS Coronavirus 2 by RT PCR NEGATIVE NEGATIVE Final    Comment: (NOTE) SARS-CoV-2 target nucleic acids are NOT DETECTED. The SARS-CoV-2 RNA is generally detectable in upper respiratoy specimens during the acute phase of infection. The lowest concentration of SARS-CoV-2 viral copies this assay can detect is 131 copies/mL. A negative result does not preclude  SARS-Cov-2 infection and should not be used as the sole basis for treatment or other patient management decisions. A negative result may occur with  improper specimen collection/handling, submission of specimen other than nasopharyngeal swab, presence of viral mutation(s) within the areas targeted by this assay, and inadequate number of viral copies (<131 copies/mL). A negative result must be combined with clinical observations, patient history, and epidemiological information. The expected result is Negative. Fact Sheet for Patients:  PinkCheek.be Fact Sheet for Healthcare Providers:  GravelBags.it This test is not yet ap proved or cleared by the [place] FDA and  has been authorized for detection and/or diagnosis of SARS-CoV-2 by FDA under an Emergency Use Authorization (EUA). This EUA will remain  in effect (meaning this test can be used) for the duration of the COVID-19 declaration under Section 564(b)(1) of the Act, 21 U.S.C. section 360bbb-3(b)(1), unless the authorization is terminated or revoked sooner.    Influenza A by PCR NEGATIVE NEGATIVE Final   Influenza B by PCR NEGATIVE NEGATIVE Final    Comment: (NOTE) The Xpert Xpress SARS-CoV-2/FLU/RSV assay is intended as an aid in  the diagnosis of influenza from Nasopharyngeal swab specimens and  should not be used as a sole basis for treatment. Nasal washings and  aspirates are unacceptable for Xpert Xpress SARS-CoV-2/FLU/RSV  testing. Fact Sheet for Patients: PinkCheek.be Fact Sheet for Healthcare Providers: GravelBags.it This test is not yet approved or cleared by the [place] FDA and  has been authorized for detection and/or diagnosis of SARS-CoV-2 by  FDA under an Emergency Use Authorization (EUA). This EUA will remain  in effect (meaning this test can be used) for the duration of the  Covid-19  declaration under Section 564(b)(1) of the Act, 21  U.S.C. section 360bbb-3(b)(1), unless the authorization is  terminated or revoked. Performed at [facility], [place] [address], [place], [place] [address]     Please note: You were cared for by a hospitalist during your hospital stay. Once you are discharged, your primary care physician will handle any further medical issues. Please note that NO REFILLS for any discharge medications will be authorized once you are discharged, as it is imperative that you return to your primary care physician (or establish a relationship with a primary care physician if you do not have one) for your post hospital discharge needs  so that they can reassess your need for medications and monitor your lab values.    Time coordinating discharge: 40 minutes  SIGNED:   Shelly Coss, MD  [facility] 01/26/2020, 4:46 PM Pager [phone]  If 7PM-7AM, please contact night-coverage [email or web address] Password [email or web address]

## 2020-01-26 NOTE — Progress Notes (Addendum)
CSW completed IVC paperwork. Faxed to [email or web address]. Return fax and documentation received. CSW contacted [facility] to request [place] service of IVC. Paperwork with 3 copies placed on patients chart.   Criss Alvine, [place] [phone]

## 2020-01-26 NOTE — Progress Notes (Signed)
Report called Will Hilma Favors RN at [facility]. Carroll Kinds RN

## 2020-01-26 NOTE — TOC Transition Note (Signed)
Transition of Care [facility]) - CM/SW Discharge Note   Patient Details  Name: Damarrius Kulas MRN: [id number] Date of Birth: Nov 07, 1971  Transition of Care [facility]) CM/SW Contact:  Arvella Merles, LCSW Phone Number: 01/26/2020, 4:38 PM   Clinical Narrative:    Patient will DC to: [place] Anticipated DC date: 01/26/2020 Family notified: Mickel Baas, wife [phone] Transport by: Brandon Melnick  Per MD patient ready for DC to [place]. CSW contacted [facility] at [phone] and confirmed they are expecting patient. RN, patient, patient's family, and facility notified of DC. RN to call report prior to discharge [phone]). Please request transport by [facility] [phone]).   CSW will sign off for now as social work intervention is no longer needed. Please consult [place] again if new needs arise.    Final next level of care: [facility]) Barriers to Discharge: No Barriers Identified   Patient Goals and CMS Choice        Discharge Placement                       Discharge Plan and Services                                     Social Determinants of Health (SDOH) Interventions     Readmission Risk Interventions No flowsheet data found.

## 2020-01-27 DIAGNOSIS — F332 Major depressive disorder, recurrent severe without psychotic features: Principal | ICD-10-CM

## 2020-01-27 LAB — URINALYSIS, ROUTINE W REFLEX MICROSCOPIC
Bilirubin Urine: NEGATIVE
Glucose, UA: NEGATIVE mg/dL
Hgb urine dipstick: NEGATIVE
Ketones, ur: 5 mg/dL — AB
Leukocytes,Ua: NEGATIVE
Nitrite: NEGATIVE
Protein, ur: NEGATIVE mg/dL
Specific Gravity, Urine: 1.005 (ref 1.005–1.030)
pH: 6 (ref 5.0–8.0)

## 2020-01-27 LAB — RAPID URINE DRUG SCREEN, HOSP PERFORMED
Amphetamines: NOT DETECTED
Barbiturates: NOT DETECTED
Benzodiazepines: POSITIVE — AB
Cocaine: NOT DETECTED
Opiates: NOT DETECTED
Tetrahydrocannabinol: NOT DETECTED

## 2020-01-27 LAB — LIPID PANEL
Cholesterol: 142 mg/dL (ref 0–200)
HDL: 37 mg/dL — ABNORMAL LOW (ref >40)
LDL Cholesterol: 85 mg/dL (ref 0–99)
Total CHOL/HDL Ratio: 3.8 RATIO
Triglycerides: 99 mg/dL (ref <150)
VLDL: 20 mg/dL (ref 0–40)

## 2020-01-27 LAB — HEMOGLOBIN A1C
Hgb A1c MFr Bld: 6.3 % — ABNORMAL HIGH (ref 4.8–5.6)
Mean Plasma Glucose: 134.11 mg/dL

## 2020-01-27 LAB — T3: T3, Total: 85 ng/dL (ref 71–180)

## 2020-01-27 MED ORDER — BIOTENE DRY MOUTH MT LIQD
15.0000 mL | OROMUCOSAL | Status: DC | PRN
  Administered 2020-01-27 – 2020-01-28 (×2): 15 mL via OROMUCOSAL
  Filled 2020-01-27: qty 237

## 2020-01-27 MED ORDER — FLUOXETINE HCL 10 MG PO CAPS
10.0000 mg | ORAL_CAPSULE | Freq: Every day | ORAL | Status: DC
  Administered 2020-01-27 – 2020-01-29 (×3): 10 mg via ORAL
  Filled 2020-01-27 (×5): qty 1

## 2020-01-27 MED ORDER — OLANZAPINE 2.5 MG PO TABS
2.5000 mg | ORAL_TABLET | Freq: Every day | ORAL | Status: DC
  Administered 2020-01-27 – 2020-01-28 (×2): 2.5 mg via ORAL
  Filled 2020-01-27 (×4): qty 1

## 2020-01-27 NOTE — H&P (Signed)
Psychiatric Admission Assessment Adult  Patient Identification: Marvin Espinoza MRN:  [id number] Date of Evaluation:  01/27/2020 Chief Complaint:  MDD (major depressive disorder), recurrent severe, without psychosis ([place]) [F33.2] Principal Diagnosis: <principal problem not specified> Diagnosis:  Active Problems:   MDD (major depressive disorder), recurrent severe, without psychosis ([place])  History of Present Illness: Patient is seen and examined.  Patient is a 49 year old male with a past psychiatric history significant for major depression versus bipolar disorder and a past medical history significant for degenerative disc disease, peripheral neuropathy, diabetes, hypothyroidism, GERD, obstructive sleep apnea, chronic pain and history of bariatric surgery who originally presented to the [facility] emergency department on 01/25/2020 after an intentional overdose of 10 Xanax and 10 Flexeril tablets.  The patient stated that he had been under increased psychosocial stressors over the last several years.  The patient went into a long dialogue about the fact that his mother was diagnosed with cancer in 2019, and then expired in 2020.  He stated that he had to empty his mother's double wide, and that his sister was living there and she had some reported substance issues.  He stated that after his sister had moved all the things out of their home recently that he spoke to his wife and 70 year old son that to not be surprised if he was not around the next morning.  He stated he did not want to blame them.  He took the overdose at home and EMS was called.  He was asleep but when he woke up apparently became combative, and had to give 5 mg of Haldol.  He was hospitalized in the medical hospital on 2/9, and psychiatric consultation was obtained on 01/26/2020.  The patient was discharged from the medical hospital on 2/10, and transferred to our facility.  The patient also stated that when he signed up for insurance this  year apparently there was some problem.  He stated his previous insurance had paid for all of his medications, but he was unable to afford the medication cost this year with his new insurance.  He stated that he had stopped taking his Depakote sometime ago, and restarted it last weekend.  He stated he sees Dr. Toy Care for medication management.  His current psychiatric medications include Xanax, Depakote, gabapentin and Ambien.  He admitted to 2 previous suicide attempts and was hospitalized.  He is been on multiple medications in the past and has had side effects or lack of efficacy with these medicines.  He stated that he had allergies to Abilify, Vraylar, Latuda, Risperdal and Seroquel.  On his last psychiatric hospitalization to our facility in October 2019 he was discharged on the combination drug of olanzapine with fluoxetine.  He could not recall having any problems with that medication, but also stated he was unable to afford it.  He is unsure what the cost of that medication is currently.  His additional psychosocial stressor which he went into detail about is his inability to find a job.  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  Dr. Toy Care had also discussed with the patient the possibility of ECT in the past.  He was admitted to the hospital for evaluation and stabilization.  Associated Signs/Symptoms: Depression Symptoms:  depressed mood, anhedonia, insomnia, psychomotor agitation, fatigue, feelings of worthlessness/guilt,  difficulty concentrating, hopelessness, suicidal thoughts with specific plan, suicidal attempt, anxiety, loss of energy/fatigue, disturbed sleep, (Hypo) Manic Symptoms:  Impulsivity, Irritable Mood, Labiality of Mood, Anxiety Symptoms:  Excessive Worry, Psychotic Symptoms:   Denied PTSD Symptoms: Negative Total Time spent with patient: 30 minutes  Past Psychiatric History: Patient's last psychiatric hospitalization was at [place] in 10/04/2018.  He overdosed at that time as well on Xanax.  He has a history of depression, previous mood disorders, previous overdose attempts and previous hospitalizations.  Is the patient at risk to self? Yes.    Has the patient been a risk to self in the past 6 months? Yes.    Has the patient been a risk to self within the distant past? Yes.    Is the patient a risk to others? No.  Has the patient been a risk to others in the past 6 months? No.  Has the patient been a risk to others within the distant past? No.   Prior Inpatient Therapy:   Prior Outpatient Therapy:    Alcohol Screening:   Substance Abuse History in the last 12 months:  No. Consequences of Substance Abuse: Negative Previous Psychotropic Medications: Yes  Psychological Evaluations: Yes  Past Medical History:  Past Medical History:  Diagnosis Date  . Anxiety   . Bipolar disorder ([place])   . CTS (carpal tunnel syndrome)   . Depression   . GERD (gastroesophageal reflux disease)   . H/O bariatric surgery   . Head injury 05/06/2017  . Hypothyroidism   . Hypothyroidism   . Sleep apnea    no need for CPAP at this time  . Sleep apnea     Past Surgical History:  Procedure Laterality Date  . ANAL FISSURE REPAIR    . BARIATRIC SURGERY    . CARPAL TUNNEL RELEASE Left 05/09/2015   Procedure: LEFT CARPAL TUNNEL RELEASE;  Surgeon: Daryll Brod, MD;  Location: [place];  Service: Orthopedics;  Laterality: Left;  . CARPAL TUNNEL RELEASE Left 05-09-2015  . CARPAL TUNNEL RELEASE Right 06/20/2015   Procedure: RIGHT CARPAL TUNNEL RELEASE;  Surgeon: Daryll Brod, MD;  Location: [place];  Service: Orthopedics;  Laterality: Right;  REGIONAL/FAB  . CHOLECYSTECTOMY    . COLONOSCOPY    . UPPER GASTROINTESTINAL ENDOSCOPY     Family History:   Family History  Problem Relation Age of Onset  . Diabetes Mother   . Heart disease Mother   . Depression Mother   . Esophageal cancer Mother   . COPD Father   . Heart disease Father   . Kidney disease Father   . Mental illness Father   . Depression Father   . Mental illness Sister   . Depression Sister   . Esophageal cancer Maternal Uncle   . Colon cancer Neg Hx   . Rectal cancer Neg Hx   . Stomach cancer Neg Hx    Family Psychiatric  History: Family history of depression in his mother and father.  Sister with mental illness and depression. Tobacco Screening:   Social History:  Social History   Substance and Sexual Activity  Alcohol Use No     Social History   Substance and Sexual Activity  Drug Use No    Additional Social History:                           Allergies:   Allergies  Allergen Reactions  . Aripiprazole Anaphylaxis and  Swelling    "slurred speech"   . Cariprazine Hcl Other (See Comments)  . Latuda  [Lurasidone Hcl] Other (See Comments)  . Seroquel [Quetiapine Fumarate]     tardive dyskinesia  . Alcohol Itching    Drinking alcohol  . Cariprazine Other (See Comments)    Tardive dyskenesia  . Hydrocodone-Acetaminophen Itching  . Lurasidone Other (See Comments)    dysconesia  . Victoza [Liraglutide] Other (See Comments)    Severe heartburn   . Victoza [Liraglutide]     Severe heartburn  . Bupropion Rash  . Risperidone Rash   Lab Results:  Results for orders placed or performed during the hospital encounter of 01/26/20 (from the past 48 hour(s))  Hemoglobin A1c     Status: Abnormal   Collection Time: 01/27/20  6:17 AM  Result Value Ref Range   Hgb A1c MFr Bld 6.3 (H) 4.8 - 5.6 %    Comment: (NOTE) Pre diabetes:          5.7%-6.4% Diabetes:              >6.4% Glycemic control for   <7.0% adults with diabetes    Mean Plasma Glucose 134.11 mg/dL    Comment: Performed at [facility], [place] [address], [place], [place]  [address]  Lipid panel     Status: Abnormal   Collection Time: 01/27/20  6:17 AM  Result Value Ref Range   Cholesterol 142 0 - 200 mg/dL   Triglycerides 99 <150 mg/dL   HDL 37 (L) >40 mg/dL   Total CHOL/HDL Ratio 3.8 RATIO   VLDL 20 0 - 40 mg/dL   LDL Cholesterol 85 0 - 99 mg/dL    Comment:        Total Cholesterol/HDL:CHD Risk Coronary Heart Disease Risk Table                     Men   Women  1/2 Average Risk   3.4   3.3  Average Risk       5.0   4.4  2 X Average Risk   9.6   7.1  3 X Average Risk  23.4   11.0        Use the calculated Patient Ratio above and the CHD Risk Table to determine the patient's CHD Risk.        ATP III CLASSIFICATION (LDL):  <100     mg/dL   Optimal  100-129  mg/dL   Near or Above                    Optimal  130-159  mg/dL   Borderline  160-189  mg/dL   High  >190     mg/dL   Very High Performed at [facility] [address], [place], [place] [address]   Urinalysis, Routine w reflex microscopic     Status: Abnormal   Collection Time: 01/27/20  6:21 AM  Result Value Ref Range   Color, Urine YELLOW YELLOW   APPearance CLEAR CLEAR   Specific Gravity, Urine 1.005 1.005 - 1.030   pH 6.0 5.0 - 8.0   Glucose, UA NEGATIVE NEGATIVE mg/dL   Hgb urine dipstick NEGATIVE NEGATIVE   Bilirubin Urine NEGATIVE NEGATIVE   Ketones, ur 5 (A) NEGATIVE mg/dL   Protein, ur NEGATIVE NEGATIVE mg/dL   Nitrite NEGATIVE NEGATIVE   Leukocytes,Ua NEGATIVE NEGATIVE    Comment: Performed at [facility] [address]  [place], [place] [address]  Urine rapid drug screen (hosp performed)not at [facility]     Status: Abnormal   Collection Time: 01/27/20  6:21 AM  Result Value Ref Range   Opiates NONE DETECTED NONE DETECTED   Cocaine NONE DETECTED NONE DETECTED   Benzodiazepines POSITIVE (A) NONE DETECTED   Amphetamines NONE DETECTED NONE DETECTED   Tetrahydrocannabinol NONE DETECTED NONE DETECTED   Barbiturates NONE DETECTED NONE  DETECTED    Comment: (NOTE) DRUG SCREEN FOR MEDICAL PURPOSES ONLY.  IF CONFIRMATION IS NEEDED FOR ANY PURPOSE, NOTIFY LAB WITHIN 5 DAYS. LOWEST DETECTABLE LIMITS FOR URINE DRUG SCREEN Drug Class                     Cutoff (ng/mL) Amphetamine and metabolites    1000 Barbiturate and metabolites    200 Benzodiazepine                 [id number] Tricyclics and metabolites     300 Opiates and metabolites        300 Cocaine and metabolites        300 THC                            50 Performed at [facility], [place] [address], [place], [place] [address]     Blood Alcohol level:  Lab Results  Component Value Date   [facility] <10 01/25/2020   ETH <10 [id number]    Metabolic Disorder Labs:  Lab Results  Component Value Date   HGBA1C 6.3 (H) 01/27/2020   MPG 134.11 01/27/2020   MPG 146 10/06/2018   No results found for: PROLACTIN Lab Results  Component Value Date   CHOL 142 01/27/2020   TRIG 99 01/27/2020   HDL 37 (L) 01/27/2020   CHOLHDL 3.8 01/27/2020   VLDL 20 01/27/2020   LDLCALC 85 01/27/2020   LDLCALC 76 08/20/2019    Current Medications: Current Facility-Administered Medications  Medication Dose Route Frequency Provider Last Rate Last Admin  . acetaminophen (TYLENOL) tablet 650 mg  650 mg Oral Q6H PRN Connye Burkitt, NP      . alum & mag hydroxide-simeth (MAALOX/MYLANTA) 200-200-20 MG/5ML suspension 30 mL  30 mL Oral Q4H PRN Connye Burkitt, NP      . antiseptic oral rinse (BIOTENE) solution 15 mL  15 mL Mouth Rinse PRN Sharma Covert, MD      . divalproex (DEPAKOTE) DR tablet 250 mg  250 mg Oral TID Connye Burkitt, NP   250 mg at 01/27/20 1109  . FLUoxetine (PROZAC) capsule 10 mg  10 mg Oral Daily Sharma Covert, MD   10 mg at 01/27/20 1100  . hydrOXYzine (ATARAX/VISTARIL) tablet 25 mg  25 mg Oral TID PRN Connye Burkitt, NP      . levothyroxine (SYNTHROID) tablet 100 mcg  100 mcg Oral Daily Connye Burkitt, NP   100 mcg at 01/27/20 [phone]  . loperamide  (IMODIUM) capsule 2-4 mg  2-4 mg Oral PRN Connye Burkitt, NP      . LORazepam (ATIVAN) tablet 1 mg  1 mg Oral Q6H PRN Connye Burkitt, NP      . magnesium hydroxide (MILK OF MAGNESIA) suspension 30 mL  30 mL Oral Daily PRN Connye Burkitt, NP      . multivitamin with minerals tablet 1 tablet  1 tablet Oral Daily Connye Burkitt, NP   1  tablet at 01/27/20 0809  . OLANZapine (ZYPREXA) tablet 2.5 mg  2.5 mg Oral QHS Sharma Covert, MD      . ondansetron (ZOFRAN-ODT) disintegrating tablet 4 mg  4 mg Oral Q6H PRN Connye Burkitt, NP      . pantoprazole (PROTONIX) EC tablet 40 mg  40 mg Oral Daily Connye Burkitt, NP   40 mg at 01/27/20 0809  . thiamine tablet 100 mg  100 mg Oral Daily Connye Burkitt, NP   100 mg at 01/27/20 0809  . traZODone (DESYREL) tablet 50 mg  50 mg Oral QHS PRN,MR X 1 Connye Burkitt, NP       PTA Medications: Medications Prior to Admission  Medication Sig Dispense Refill Last Dose  . Calcium Carbonate-Vit D-Min (CALCIUM 1200 PO) Take 1 tablet by mouth 2 (two) times daily.      Carin Hock Iron 15 MG CHEW Chew 1 tablet by mouth daily.      . chlordiazePOXIDE (LIBRIUM) 25 MG capsule Take 1 capsule (25 mg total) by mouth every 6 (six) hours as needed for withdrawal (CIWA score > 10). 30 capsule 0   . divalproex (DEPAKOTE) 250 MG DR tablet Take 1 tablet (250 mg total) by mouth 3 (three) times daily.     Marland Kitchen gabapentin (NEURONTIN) 300 MG capsule TAKE 1 TO 2 CAPSULES AT BEDTIME (Patient taking differently: Take 300 mg by mouth daily as needed (For back pain). ) 60 capsule 0   . levothyroxine (SYNTHROID) 100 MCG tablet TAKE ONE (1) TABLET EACH DAY (Patient taking differently: Take 100 mcg by mouth daily. ) 90 tablet 5   . lubiprostone (AMITIZA) 24 MCG capsule TAKE 1 CAPSULE TWICE DAILY WITH A MEAL (Patient taking differently: Take 24 mcg by mouth 2 (two) times daily. ) 60 capsule 11   . Multiple Vitamin (MULTIVITAMIN WITH MINERALS) TABS tablet Take 1 tablet by mouth 2 (two) times daily.  For Vitamin supplementation     . omeprazole (PRILOSEC) 40 MG capsule Take 1 capsule (40 mg total) by mouth 2 (two) times daily. TAKE ONE (1) CAPSULE EACH DAY (Patient taking differently: Take 40 mg by mouth 2 (two) times daily. ) 60 capsule 11   . thiamine 100 MG tablet Take 1 tablet (100 mg total) by mouth daily.     . vitamin B-12 (CYANOCOBALAMIN) 250 MCG tablet Take 250 mcg by mouth daily.       Musculoskeletal: Strength & Muscle Tone: within normal limits Gait & Station: normal Patient leans: N/A  Psychiatric Specialty Exam: Physical Exam  Nursing note and vitals reviewed. Constitutional: He is oriented to person, place, and time. He appears well-developed and well-nourished.  HENT:  Head: Normocephalic and atraumatic.  Respiratory: Effort normal.  Neurological: He is alert and oriented to person, place, and time.    Review of Systems  Blood pressure 137/89, pulse 69, temperature 97.9 F (36.6 C), temperature source Oral, resp. rate 18, height 5\' 10"  (1.778 m), weight 107 kg, SpO2 100 %.Body mass index is 33.86 kg/m.  General Appearance: Casual  Eye Contact:  Fair  Speech:  Normal Rate  Volume:  Normal  Mood:  Anxious and Depressed  Affect:  Congruent  Thought Process:  Coherent and Descriptions of Associations: Intact  Orientation:  Full (Time, Place, and Person)  Thought Content:  Logical  Suicidal Thoughts:  No  Homicidal Thoughts:  No  Memory:  Immediate;   Fair Recent;   Fair Remote;   Fair  Judgement:  Impaired  Insight:  Lacking  Psychomotor Activity:  Increased  Concentration:  Concentration: Fair and Attention Span: Fair  Recall:  [employer] of Knowledge:  Fair  Language:  Good  Akathisia:  Negative  Handed:  Right  AIMS (if indicated):     Assets:  Desire for Improvement Housing Resilience  ADL's:  Intact  Cognition:  WNL  Sleep:  Number of Hours: 4.5    Treatment Plan Summary: Daily contact with patient to assess and evaluate symptoms and  progress in treatment, Medication management and Plan : Patient is seen and examined.  Patient is a 49 year old male with the above-stated past psychiatric history was admitted after an intentional overdose of Xanax and Flexeril.  He will be admitted to the hospital.  He will be integrated into the milieu.  He will be encouraged to attend groups.  He has already been started on a wean from the Xanax at the medical hospital.  And we will continue that.  He received Librium in the medical hospital, and we will switch him to lorazepam given the overdose.  He is very anxious, and I will start him on fluoxetine 20 mg p.o. daily today.  I will also write for olanzapine 5 mg p.o. nightly for mood stability.  His TSH at the medical hospital was 5.438, and we will continue his Synthroid at its current dosage.  His Depakote level was only 16, and this reflects his noncompliance with that.  That will be restarted at 250 mg p.o. 3 times daily for now.  His CBC did show a mild anemia with a hemoglobin of 12.6 and hematocrit of 37.0.  His electrolytes showed a mild elevation of sodium at 148.  Liver function enzymes were normal.  His creatinine was normal at 0.99.  His vital signs are stable, he is afebrile.  Hopefully we can get his situation turned around fairly quickly.  Observation Level/Precautions:  Detox 15 minute checks  Laboratory:  Chemistry Profile  Psychotherapy:    Medications:    Consultations:    Discharge Concerns:    Estimated LOS:  Other:     Physician Treatment Plan for Primary Diagnosis: <principal problem not specified> Long Term Goal(s): Improvement in symptoms so as ready for discharge  Short Term Goals: Ability to identify changes in lifestyle to reduce recurrence of condition will improve, Ability to verbalize feelings will improve, Ability to disclose and discuss suicidal ideas, Ability to demonstrate self-control will improve, Ability to identify and develop effective coping behaviors  will improve, Ability to maintain clinical measurements within normal limits will improve and Compliance with prescribed medications will improve  Physician Treatment Plan for Secondary Diagnosis: Active Problems:   MDD (major depressive disorder), recurrent severe, without psychosis ([place])  Long Term Goal(s): Improvement in symptoms so as ready for discharge  Short Term Goals: Ability to identify changes in lifestyle to reduce recurrence of condition will improve, Ability to verbalize feelings will improve, Ability to disclose and discuss suicidal ideas, Ability to demonstrate self-control will improve, Ability to identify and develop effective coping behaviors will improve, Ability to maintain clinical measurements within normal limits will improve and Compliance with prescribed medications will improve  I certify that inpatient services furnished can reasonably be expected to improve the patient's condition.    Sharma Covert, MD 2/11/20212:54 PM

## 2020-01-27 NOTE — Progress Notes (Signed)
Adult Psychoeducational Group Note  Date:  01/27/2020 Time:  11:28 AM  Group Topic/Focus:  Orientation:   The focus of this group is to educate the patient on the purpose and policies of crisis stabilization and provide a format to answer questions about their admission.  The group details unit policies and expectations of patients while admitted.  Participation Level:  Active  Participation Quality:  Appropriate  Affect:  Appropriate  Cognitive:  Alert  Insight: Appropriate  Engagement in Group:  Engaged  Modes of Intervention:  Discussion and Education  Additional Comments:    Pt attended orientation group. MHT reviewed the treatment agreement and unit rules. MHT answered questions related to the unit. Patient's were given their daily packet and asked to plan a daily goal which will be discussed in the afternoon MHT group.   Lita Mains 01/27/2020, 11:28 AM

## 2020-01-27 NOTE — Plan of Care (Signed)
Nurse discussed anxiety, depression and coping skills with patient.  

## 2020-01-27 NOTE — Progress Notes (Signed)
   01/27/20 2100  Psych Admission Type (Psych Patients Only)  Admission Status Involuntary  Psychosocial Assessment  Patient Complaints Anxiety  Eye Contact Brief  Facial Expression Anxious  Affect Anxious  Speech Logical/coherent  Interaction Assertive  Motor Activity Slow  Appearance/Hygiene Unremarkable  Behavior Characteristics Cooperative  Mood Anxious;Depressed  Thought Process  Coherency WDL  Content WDL  Delusions None reported or observed  Perception WDL  Hallucination None reported or observed  Judgment Poor  Confusion WDL  Danger to Self  Current suicidal ideation? Denies  Danger to Others  Danger to Others None reported or observed

## 2020-01-27 NOTE — Progress Notes (Signed)
D:  Patient's self inventory sheet, patient has poor sleep, no sleep medication.  Good appetite, low energy level, good concentration.  Rated depression 2, hopeless #2, anxiety 5.   Denied withdrawals.  Denied SI.  Physical problems, pain, from falling Tuesday (not at [facility]).  Pain in knees, face, chest.  No pain medicine.  Goal is get over pain and deal with mother's pain.  Trying to deal with mother's pain.  Plans to take it slow.  Does have discharge plans.  Plans to move sister out of mother's house.   She brings drugs around my family and I.  Medications administered per MD order.  Emotional support and encouragement given patient. Safety maintained with 15 minute checks.  Patient denied SI and HI, contracts for safety.  Denied A/V hallucinations. Safety maintained with 15 minute ckecks.

## 2020-01-27 NOTE — [facility] Suicide Risk Assessment (Signed)
[facility] Admission Suicide Risk Assessment   Nursing information obtained from:  Patient Demographic factors:  Male, Unemployed, Low socioeconomic status Current Mental Status:  Suicidal ideation indicated by patient Loss Factors:  Decrease in vocational status, Financial problems / change in socioeconomic status Historical Factors:  NA Risk Reduction Factors:  Sense of responsibility to family, Living with another person, especially a relative  Total Time spent with patient: 30 minutes Principal Problem: <principal problem not specified> Diagnosis:  Active Problems:   MDD (major depressive disorder), recurrent severe, without psychosis (Marvin Espinoza)  Subjective Data: Patient is seen and examined.  Patient is a 49 year old male with a past psychiatric history significant for major depression versus bipolar disorder and a past medical history significant for degenerative disc disease, peripheral neuropathy, diabetes, hypothyroidism, GERD, obstructive sleep apnea, chronic pain and history of bariatric surgery who originally presented to the [facility] emergency department on 01/25/2020 after an intentional overdose of 10 Xanax and 10 Flexeril tablets.  The patient stated that he had been under increased psychosocial stressors over the last several years.  The patient went into a long dialogue about the fact that his mother was diagnosed with cancer in 2019, and then expired in 2020.  He stated that he had to empty his mother's double wide, and that his sister was living there and she had some reported substance issues.  He stated that after his sister had moved all the things out of their home recently that he spoke to his wife and 61 year old son that to not be surprised if he was not around the next morning.  He stated he did not want to blame them.  He took the overdose at home and EMS was called.  He was asleep but when he woke up apparently became combative, and had to give 5 mg of Haldol.  He was hospitalized in  the medical hospital on 2/9, and psychiatric consultation was obtained on 01/26/2020.  The patient was discharged from the medical hospital on 2/10, and transferred to our facility.  The patient also stated that when he signed up for insurance this year apparently there was some problem.  He stated his previous insurance had paid for all of his medications, but he was unable to afford the medication cost this year with his new insurance.  He stated that he had stopped taking his Depakote sometime ago, and restarted it last weekend.  He stated he sees Dr. Toy Care for medication management.  His current psychiatric medications include Xanax, Depakote, gabapentin and Ambien.  He admitted to 2 previous suicide attempts and was hospitalized.  He is been on multiple medications in the past and has had side effects or lack of efficacy with these medicines.  He stated that he had allergies to Abilify, Vraylar, Latuda, Risperdal and Seroquel.  On his last psychiatric hospitalization to our facility in October 2019 he was discharged on the combination drug of olanzapine with fluoxetine.  He could not recall having any problems with that medication, but also stated he was unable to afford it.  He is unsure what the cost of that medication is currently.  His additional psychosocial stressor which he went into detail about is his inability to find a job.  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  Dr. Toy Care  had also discussed with the patient the possibility of ECT in the past.  He was admitted to the hospital for evaluation and stabilization.  Continued Clinical Symptoms:    The "Alcohol Use Disorders Identification Test", Guidelines for Use in Primary Care, Second Edition.  World Pharmacologist [facility]). Score between 0-7:  no or low risk  or alcohol related problems. Score between 8-15:  moderate risk of alcohol related problems. Score between 16-19:  high risk of alcohol related problems. Score 20 or above:  warrants further diagnostic evaluation for alcohol dependence and treatment.   CLINICAL FACTORS:   Severe Anxiety and/or Agitation Bipolar Disorder:   Depressive phase Depression:   Anhedonia Hopelessness Impulsivity Insomnia Personality Disorders:   Cluster B Chronic Pain More than one psychiatric diagnosis Previous Psychiatric Diagnoses and Treatments Medical Diagnoses and Treatments/Surgeries   Musculoskeletal: Strength & Muscle Tone: within normal limits Gait & Station: normal Patient leans: N/A  Psychiatric Specialty Exam: Physical Exam  Nursing note and vitals reviewed. Constitutional: He is oriented to person, place, and time. He appears well-developed and well-nourished.  HENT:  Head: Normocephalic and atraumatic.  Respiratory: Effort normal.  Neurological: He is alert and oriented to person, place, and time.    Review of Systems  Blood pressure 110/81, pulse 98, temperature 97.9 F (36.6 C), temperature source Oral, resp. rate 18, height 5\' 10"  (1.778 m), weight 107 kg, SpO2 100 %.Body mass index is 33.86 kg/m.  General Appearance: Casual  Eye Contact:  Fair  Speech:  Normal Rate  Volume:  Normal  Mood:  Anxious and Depressed  Affect:  Congruent  Thought Process:  Coherent and Descriptions of Associations: Intact  Orientation:  Full (Time, Place, and Person)  Thought Content:  Logical  Suicidal Thoughts:  No  Homicidal Thoughts:  No  Memory:  Immediate;   Fair Recent;   Fair Remote;   Fair  Judgement:  Impaired  Insight:  Lacking  Psychomotor Activity:  Increased  Concentration:  Concentration: Fair and Attention Span: Fair  Recall:  Good  Fund of Knowledge:  Good  Language:  Good  Akathisia:  Negative  Handed:  Right  AIMS (if indicated):     Assets:  Desire for  Improvement Housing Resilience  ADL's:  Intact  Cognition:  WNL  Sleep:  Number of Hours: 4.5      COGNITIVE FEATURES THAT CONTRIBUTE TO RISK:  Thought constriction (tunnel vision)    SUICIDE RISK:   Mild:  Suicidal ideation of limited frequency, intensity, duration, and specificity.  There are no identifiable plans, no associated intent, mild dysphoria and related symptoms, good self-control (both objective and subjective assessment), few other risk factors, and identifiable protective factors, including available and accessible social support.  PLAN OF CARE: Patient is seen and examined.  Patient is a 49 year old male with the above-stated past psychiatric history was admitted after an intentional overdose of Xanax and Flexeril.  He will be admitted to the hospital.  He will be integrated into the milieu.  He will be encouraged to attend groups.  He has already been started on a wean from the Xanax at the medical hospital.  And we will continue that.  He received Librium in the medical hospital, and we will switch him to lorazepam given the overdose.  He is very anxious, and I will start him on fluoxetine 20 mg p.o. daily today.  I will also write for olanzapine 5 mg p.o. nightly for mood stability.  His TSH at the medical hospital  was 5.438, and we will continue his Synthroid at its current dosage.  His Depakote level was only 16, and this reflects his noncompliance with that.  That will be restarted at 250 mg p.o. 3 times daily for now.  His CBC did show a mild anemia with a hemoglobin of 12.6 and hematocrit of 37.0.  His electrolytes showed a mild elevation of sodium at 148.  Liver function enzymes were normal.  His creatinine was normal at 0.99.  His vital signs are stable, he is afebrile.  Hopefully we can get his situation turned around fairly quickly.  I certify that inpatient services furnished can reasonably be expected to improve the patient's condition.   Sharma Covert,  MD 01/27/2020, 9:43 AM

## 2020-01-27 NOTE — Progress Notes (Signed)
   01/27/20 0105  Charting Type  Charting Type Shift assessment  Orders Chart Check (once per shift) Completed  Safety Check Verification  Has the RN verified the 15 minute safety check completion? Yes  Neurological  Neuro (WDL) WDL  HEENT  HEENT (WDL) WDL  Respiratory  Respiratory (WDL) WDL  Cardiac  Cardiac (WDL) WDL  Vascular  Vascular (WDL) WDL  Integumentary  Integumentary (WDL) X  Braden Scale (Ages 8 and up)  Sensory Perceptions 4  Moisture 4  Activity 4  Mobility 4  Nutrition 3  Friction and Shear 3  Braden Scale Score 22  Musculoskeletal  Musculoskeletal (WDL) WDL  Gastrointestinal  Gastrointestinal (WDL) WDL  GU Assessment  Genitourinary (WDL) WDL  D: Patient sleeping most of the evening. Pt woke up asking how he got to the hospital.   A: Support and encouragement provided as needed.  R: Patient remains safe on the unit. Will continue to monitor for safety and stability.

## 2020-01-28 MED ORDER — TRAMADOL HCL 50 MG PO TABS: 50.0000 mg | ORAL_TABLET | Freq: Four times a day (QID) | ORAL | Status: DC | PRN

## 2020-01-28 MED ORDER — VITAMIN D3 25 MCG PO TABS
1000.0000 [IU] | ORAL_TABLET | Freq: Every day | ORAL | Status: DC
  Administered 2020-01-28 – 2020-01-29 (×2): 1000 [IU] via ORAL
  Filled 2020-01-28 (×5): qty 1

## 2020-01-28 MED ORDER — FOLIC ACID 1 MG PO TABS
1.0000 mg | ORAL_TABLET | Freq: Every day | ORAL | Status: DC
  Administered 2020-01-28 – 2020-01-29 (×2): 1 mg via ORAL
  Filled 2020-01-28 (×5): qty 1

## 2020-01-28 NOTE — Progress Notes (Signed)
[facility] MD Progress Note  01/28/2020 10:12 AM Marvin Espinoza  MRN:  [id number] Subjective:   Patient is a 49 year old male with a past psychiatric history significant for major depression versus bipolar disorder and a past medical history significant for degenerative disc disease, peripheral neuropathy, diabetes, hypothyroidism, GERD, obstructive sleep apnea, chronic pain and history of bariatric surgery who originally presented to the [facility] emergency department on 01/25/2020 after an intentional overdose of 10 Xanax and 10 Flexeril tablets.    Objective: Patient is seen and examined.  Patient is a 49 year old male with the above-stated past psychiatric history who is seen in follow-up.  He stated he is feeling better today.  He stated that he is withdrawn from the Xanax without great difficulty.  He continues to have available lorazepam as needed.  He stated restarting the Depakote is helped a great deal, and he is not suicidal today.  We discussed how he is tolerating the fluoxetine as well as the olanzapine.  He stated he is not having a problems with either 1 of those.  We discussed the fact that he had been prescribed this in the past as the brand-name combination drug, but that both these medicines individually were generic and it would be more affordable.  He did state that he slept fairly well last night, but not great.  We discussed increasing the olanzapine to 5 mg p.o. nightly.  He did complain of right-sided chest pain.  This is from where he fell after the overdose.  He has no history of opiate problems, but does have a history of gastric bypass and is unable to take nonsteroidals outside of Tylenol.  We discussed the possibility of low-dose tramadol and he will give it a try today.  He denied any auditory or visual hallucinations.  He denied any suicidal or homicidal ideation.  His blood pressure was initially mildly elevated today at 136/92, repeat showed 131/90.  Pulse was 83.  He is afebrile.   His last CIWA was only 1 this AM.  He slept 4.5 hours last night.  He denied any side effects to his current medications.  Review of his laboratories revealed essentially normal lipid panel.  His hemoglobin was slightly low at 12.6, and hematocrit was slightly low at 37.0.  His hemoglobin A1c on 2/11 was 6.3.  TSH was 5.438.  His T3 and T4 were essentially normal.  His chest x-ray on admission did not show any fractures of his rib cage.  Principal Problem: <principal problem not specified> Diagnosis: Active Problems:   MDD (major depressive disorder), recurrent severe, without psychosis ([place])  Total Time spent with patient: 20 minutes  Past Psychiatric History: See admission H&P  Past Medical History:  Past Medical History:  Diagnosis Date  . Anxiety   . Bipolar disorder ([place])   . CTS (carpal tunnel syndrome)   . Depression   . GERD (gastroesophageal reflux disease)   . H/O bariatric surgery   . Head injury 05/06/2017  . Hypothyroidism   . Hypothyroidism   . Sleep apnea    no need for CPAP at this time  . Sleep apnea     Past Surgical History:  Procedure Laterality Date  . ANAL FISSURE REPAIR    . BARIATRIC SURGERY    . CARPAL TUNNEL RELEASE Left 05/09/2015   Procedure: LEFT CARPAL TUNNEL RELEASE;  Surgeon: Daryll Brod, MD;  Location: [place];  Service: Orthopedics;  Laterality: Left;  . CARPAL TUNNEL RELEASE Left 05-09-2015  .  CARPAL TUNNEL RELEASE Right 06/20/2015   Procedure: RIGHT CARPAL TUNNEL RELEASE;  Surgeon: Daryll Brod, MD;  Location: [place];  Service: Orthopedics;  Laterality: Right;  REGIONAL/FAB  . CHOLECYSTECTOMY    . COLONOSCOPY    . UPPER GASTROINTESTINAL ENDOSCOPY     Family History:  Family History  Problem Relation Age of Onset  . Diabetes Mother   . Heart disease Mother   . Depression Mother   . Esophageal cancer Mother   . COPD Father   . Heart disease Father   . Kidney disease Father   . Mental illness Father   .  Depression Father   . Mental illness Sister   . Depression Sister   . Esophageal cancer Maternal Uncle   . Colon cancer Neg Hx   . Rectal cancer Neg Hx   . Stomach cancer Neg Hx    Family Psychiatric  History: See admission H&P Social History:  Social History   Substance and Sexual Activity  Alcohol Use No     Social History   Substance and Sexual Activity  Drug Use No    Social History   Socioeconomic History  . Marital status: Married    Spouse name: Not on file  . Number of children: 1  . Years of education: Not on file  . Highest education level: Not on file  Occupational History  . Not on file  Tobacco Use  . Smoking status: Never Smoker  . Smokeless tobacco: Never Used  Substance and Sexual Activity  . Alcohol use: No  . Drug use: No  . Sexual activity: Yes  Other Topics Concern  . Not on file  Social History Narrative   ** Merged History Encounter **       Social Determinants of Health   Financial Resource Strain:   . Difficulty of Paying Living Expenses: Not on file  Food Insecurity:   . Worried About Charity fundraiser in the Last Year: Not on file  . Ran Out of Food in the Last Year: Not on file  Transportation Needs:   . Lack of Transportation (Medical): Not on file  . Lack of Transportation (Non-Medical): Not on file  Physical Activity:   . Days of Exercise per Week: Not on file  . Minutes of Exercise per Session: Not on file  Stress:   . Feeling of Stress : Not on file  Social Connections:   . Frequency of Communication with Friends and Family: Not on file  . Frequency of Social Gatherings with Friends and Family: Not on file  . Attends Religious Services: Not on file  . Active Member of Clubs or Organizations: Not on file  . Attends Archivist Meetings: Not on file  . Marital Status: Not on file   Additional Social History:                         Sleep: Fair  Appetite:  Fair  Current Medications: Current  Facility-Administered Medications  Medication Dose Route Frequency Provider Last Rate Last Admin  . acetaminophen (TYLENOL) tablet 650 mg  650 mg Oral Q6H PRN Connye Burkitt, NP      . alum & mag hydroxide-simeth (MAALOX/MYLANTA) 200-200-20 MG/5ML suspension 30 mL  30 mL Oral Q4H PRN Connye Burkitt, NP      . antiseptic oral rinse (BIOTENE) solution 15 mL  15 mL Mouth Rinse PRN Mallie Darting Cordie Grice, MD   15  mL at 01/28/20 0749  . divalproex (DEPAKOTE) DR tablet 250 mg  250 mg Oral TID Connye Burkitt, NP   250 mg at 01/28/20 0745  . FLUoxetine (PROZAC) capsule 10 mg  10 mg Oral Daily Sharma Covert, MD   10 mg at 01/28/20 0745  . hydrOXYzine (ATARAX/VISTARIL) tablet 25 mg  25 mg Oral TID PRN Connye Burkitt, NP   25 mg at 01/27/20 2148  . levothyroxine (SYNTHROID) tablet 100 mcg  100 mcg Oral Daily Connye Burkitt, NP   100 mcg at 01/28/20 0644  . loperamide (IMODIUM) capsule 2-4 mg  2-4 mg Oral PRN Connye Burkitt, NP      . LORazepam (ATIVAN) tablet 1 mg  1 mg Oral Q6H PRN Connye Burkitt, NP      . magnesium hydroxide (MILK OF MAGNESIA) suspension 30 mL  30 mL Oral Daily PRN Connye Burkitt, NP      . multivitamin with minerals tablet 1 tablet  1 tablet Oral Daily Connye Burkitt, NP   1 tablet at 01/28/20 0745  . OLANZapine (ZYPREXA) tablet 2.5 mg  2.5 mg Oral QHS Sharma Covert, MD   2.5 mg at 01/27/20 2148  . ondansetron (ZOFRAN-ODT) disintegrating tablet 4 mg  4 mg Oral Q6H PRN Connye Burkitt, NP      . pantoprazole (PROTONIX) EC tablet 40 mg  40 mg Oral Daily Connye Burkitt, NP   40 mg at 01/28/20 0744  . thiamine tablet 100 mg  100 mg Oral Daily Connye Burkitt, NP   100 mg at 01/28/20 0745  . traZODone (DESYREL) tablet 50 mg  50 mg Oral QHS PRN,MR X 1 Connye Burkitt, NP   50 mg at 01/28/20 [id number]    Lab Results:  Results for orders placed or performed during the hospital encounter of 01/26/20 (from the past 48 hour(s))  Hemoglobin A1c     Status: Abnormal   Collection Time: 01/27/20   6:17 AM  Result Value Ref Range   Hgb A1c MFr Bld 6.3 (H) 4.8 - 5.6 %    Comment: (NOTE) Pre diabetes:          5.7%-6.4% Diabetes:              >6.4% Glycemic control for   <7.0% adults with diabetes    Mean Plasma Glucose 134.11 mg/dL    Comment: Performed at [facility], [place] [address] [place], [place] [address]  Lipid panel     Status: Abnormal   Collection Time: 01/27/20  6:17 AM  Result Value Ref Range   Cholesterol 142 0 - 200 mg/dL   Triglycerides 99 <150 mg/dL   HDL 37 (L) >40 mg/dL   Total CHOL/HDL Ratio 3.8 RATIO   VLDL 20 0 - 40 mg/dL   LDL Cholesterol 85 0 - 99 mg/dL    Comment:        Total Cholesterol/HDL:CHD Risk Coronary Heart Disease Risk Table                     Men   Women  1/2 Average Risk   3.4   3.3  Average Risk       5.0   4.4  2 X Average Risk   9.6   7.1  3 X Average Risk  23.4   11.0        Use the calculated Patient Ratio above and the CHD Risk Table  to determine the patient's CHD Risk.        ATP III CLASSIFICATION (LDL):  <100     mg/dL   Optimal  100-129  mg/dL   Near or Above                    Optimal  130-159  mg/dL   Borderline  160-189  mg/dL   High  >190     mg/dL   Very High Performed at [facility] [address], [place], [place] [address]   Urinalysis, Routine w reflex microscopic     Status: Abnormal   Collection Time: 01/27/20  6:21 AM  Result Value Ref Range   Color, Urine YELLOW YELLOW   APPearance CLEAR CLEAR   Specific Gravity, Urine 1.005 1.005 - 1.030   pH 6.0 5.0 - 8.0   Glucose, UA NEGATIVE NEGATIVE mg/dL   Hgb urine dipstick NEGATIVE NEGATIVE   Bilirubin Urine NEGATIVE NEGATIVE   Ketones, ur 5 (A) NEGATIVE mg/dL   Protein, ur NEGATIVE NEGATIVE mg/dL   Nitrite NEGATIVE NEGATIVE   Leukocytes,Ua NEGATIVE NEGATIVE    Comment: Performed at [facility] [address] [place], [place] [address]  Urine rapid drug screen (hosp performed)not at [facility]     Status:  Abnormal   Collection Time: 01/27/20  6:21 AM  Result Value Ref Range   Opiates NONE DETECTED NONE DETECTED   Cocaine NONE DETECTED NONE DETECTED   Benzodiazepines POSITIVE (A) NONE DETECTED   Amphetamines NONE DETECTED NONE DETECTED   Tetrahydrocannabinol NONE DETECTED NONE DETECTED   Barbiturates NONE DETECTED NONE DETECTED    Comment: (NOTE) DRUG SCREEN FOR MEDICAL PURPOSES ONLY.  IF CONFIRMATION IS NEEDED FOR ANY PURPOSE, NOTIFY LAB WITHIN 5 DAYS. LOWEST DETECTABLE LIMITS FOR URINE DRUG SCREEN Drug Class                     Cutoff (ng/mL) Amphetamine and metabolites    1000 Barbiturate and metabolites    200 Benzodiazepine                 [id number] Tricyclics and metabolites     300 Opiates and metabolites        300 Cocaine and metabolites        300 THC                            50 Performed at [facility], [place] [address] [place],  [address]     Blood Alcohol level:  Lab Results  Component Value Date   ETH <10 01/25/2020   ETH <10 123456    Metabolic Disorder Labs: Lab Results  Component Value Date   HGBA1C 6.3 (H) 01/27/2020   MPG 134.11 01/27/2020   MPG 146 10/06/2018   No results found for: PROLACTIN Lab Results  Component Value Date   CHOL 142 01/27/2020   TRIG 99 01/27/2020   HDL 37 (L) 01/27/2020   CHOLHDL 3.8 01/27/2020   VLDL 20 01/27/2020   LDLCALC 85 01/27/2020   LDLCALC 76 08/20/2019    Physical Findings: AIMS: Facial and Oral Movements Muscles of Facial Expression: None, normal Lips and Perioral Area: None, normal Jaw: None, normal Tongue: None, normal,Extremity Movements Upper (arms, wrists, hands, fingers): None, normal Lower (legs, knees, ankles, toes): None, normal, Trunk Movements Neck, shoulders, hips: None, normal, Overall Severity Severity of abnormal movements (highest score from questions  above): None, normal Incapacitation due to abnormal movements: None, normal Patient's awareness of abnormal  movements (rate only patient's report): No Awareness, Dental Status Current problems with teeth and/or dentures?: No Does patient usually wear dentures?: No  CIWA:  CIWA-Ar Total: 1 COWS:     Musculoskeletal: Strength & Muscle Tone: within normal limits Gait & Station: normal Patient leans: N/A  Psychiatric Specialty Exam: Physical Exam  Nursing note and vitals reviewed. Constitutional: He is oriented to person, place, and time. He appears well-developed and well-nourished.  HENT:  Head: Normocephalic and atraumatic.  Respiratory: Effort normal.  Neurological: He is alert and oriented to person, place, and time.    Review of Systems  Blood pressure 131/90, pulse 83, temperature 98 F (36.7 C), temperature source Oral, resp. rate 18, height 5\' 10"  (1.778 m), weight 107 kg, SpO2 100 %.Body mass index is 33.86 kg/m.  General Appearance: Casual  Eye Contact:  Good  Speech:  Normal Rate  Volume:  Normal  Mood:  Euthymic  Affect:  Congruent  Thought Process:  Coherent and Descriptions of Associations: Intact  Orientation:  Full (Time, Place, and Person)  Thought Content:  Logical  Suicidal Thoughts:  No  Homicidal Thoughts:  No  Memory:  Immediate;   Fair Recent;   Fair Remote;   Fair  Judgement:  Fair  Insight:  Fair  Psychomotor Activity:  Increased  Concentration:  Concentration: Good and Attention Span: Good  Recall:  Good  Fund of Knowledge:  Good  Language:  Good  Akathisia:  Negative  Handed:  Right  AIMS (if indicated):     Assets:  Communication Skills Desire for Improvement Financial Resources/Insurance Housing Resilience Social Support Transportation Vocational/Educational  ADL's:  Intact  Cognition:  WNL  Sleep:  Number of Hours: 4.5     Treatment Plan Summary: Daily contact with patient to assess and evaluate symptoms and progress in treatment, Medication management and Plan : Patient is seen and examined.  Patient is a 48 year old male with the  above-stated past psychiatric history who is seen in follow-up.  Diagnosis: #1 bipolar disorder, most recently mixed, #2 generalized anxiety disorder, #3 peripheral neuropathy, #4 osteoarthritis, #5 hypothyroidism, #6 obstructive sleep apnea, #7 chronic pain, #8 history of gastric bypass surgery, #9 GERD, #10 rib pain, #11 degenerative disc disease  Patient is seen in follow-up.  He is doing much better today.  He is tolerating his medicines well.  He does have somewhat of a dry mouth, but is on Biotene mouthwash for that.  His sleep is still not great, and we discussed increasing his Zyprexa to 5 mg p.o. nightly.  He also continues to have some right-sided rib pain after he fell after the overdose.  I reviewed his chest x-ray results which showed no rib fractures.  I will add tramadol 50 mg p.o. every 8 hours as needed rib pain.  He also has Tylenol available.  He has a history of gastric bypass surgery, and I need to add vitamin D and a multivitamin as well as the thiamine and folic acid.  No other changes in his medications at this time.  1.  Continue Biotene mouthwash as needed for dry mouth. 2.  Continue Depakote DR 250 mg p.o. 3 times daily for mood stability. 3.  Continue fluoxetine 10 mg p.o. daily for anxiety and depression. 4.  Continue levothyroxine 100 mcg p.o. daily for hypothyroidism. 5.  Continue lorazepam 1 mg p.o. every 6 hours as needed a CIWA greater than 10  for benzodiazepine withdrawal symptoms. 6.  Increase Zyprexa to 5 mg p.o. nightly for mood stability and sleep. 7.  Continue Zofran 4 mg p.o. every 6 hours as needed nausea and vomiting. 8.  Continue Protonix 40 mg p.o. daily for gastric protection given his gastric bypass surgery. 9.  Continue thiamine 100 mg p.o. daily for nutritional supplementation. 11.  Continue folic acid 1 mg p.o. daily for nutritional supplementation. 12.  Add vitamin D daily for nutritional supplementation. 13.  Add a multivitamin 1 tablet p.o. daily  for nutritional supplementation. 14.  Disposition planning-in progress. Sharma Covert, MD 01/28/2020, 10:12 AM

## 2020-01-28 NOTE — Plan of Care (Signed)
Progress note  D: pt found in bed; compliant with medication administration. Pt states they slept fair. Pt rates their depression/hopelessness/anxiety a 2/0/1 out of 10 respectively. Pt has complaints of agitation, and dry mouth as well. Pt states their goal is to get home to their family. Pt will achieve this by listening to instructions. Pt is pleasant but minimal on the unit. Pt denies si/hi/ah/vh and verbally agrees to approach staff if these become apparent or before harming themself/others while at [place].  A: Pt provided support and encouragement. Pt given medication per protocol and standing orders. Q26m safety checks implemented and continued.  R: Pt safe on the unit. Will continue to monitor.  Pt progressing in the following metrics  Problem: Activity: Goal: Interest or engagement in activities will improve Outcome: Progressing Goal: Sleeping patterns will improve Outcome: Progressing   Problem: Coping: Goal: Ability to verbalize frustrations and anger appropriately will improve Outcome: Progressing

## 2020-01-28 NOTE — Progress Notes (Signed)
Patient attended AA group and participated. 

## 2020-01-28 NOTE — Progress Notes (Signed)
Recreation Therapy Notes  Date:  2.12.21 Time: 0930 Location: [address] Group Room  Group Topic: Stress Management  Goal Area(s) Addresses:  Patient will identify positive stress management techniques. Patient will identify benefits of using stress management post d/c.  Behavioral Response: Engaged  Intervention: Stress Management  Activity : Meditation.  LRT played a meditation that focused on choosing to make positive choices.  Patients were to listen and follow along as meditation played.  Education:  Stress Management, Discharge Planning.   Education Outcome: Acknowledges Education  Clinical Observations/Feedback: Pt attended and participated in activity.    Victorino Sparrow, LRT/CTRS         Ria Comment, Rosmary Dionisio A 01/28/2020 11:00 AM

## 2020-01-28 NOTE — Progress Notes (Signed)
Adult Psychoeducational Group Note  Date:  01/28/2020 Time:  2:39 PM  Group Topic/Focus:  Goals Group:   The focus of this group is to help patients establish daily goals to achieve during treatment and discuss how the patient can incorporate goal setting into their daily lives to aide in recovery.  Participation Level:  Active  Participation Quality:  Appropriate  Affect:  Appropriate  Cognitive:  Alert and Appropriate  Insight: Appropriate  Engagement in Group:  Engaged  Modes of Intervention:  Discussion  Additional Comments:  Pt attended group and participated in discussion.  Jasmarie Coppock R Connor Meacham 01/28/2020, 2:39 PM

## 2020-01-28 NOTE — Tx Team (Signed)
Interdisciplinary Treatment and Diagnostic Plan Update  01/28/2020 Time of Session: 9:00am Marvin Espinoza MRN: [id number]  Principal Diagnosis: <principal problem not specified>  Secondary Diagnoses: Active Problems:   MDD (major depressive disorder), recurrent severe, without psychosis ([place])   Current Medications:  Current Facility-Administered Medications  Medication Dose Route Frequency Provider Last Rate Last Admin  . acetaminophen (TYLENOL) tablet 650 mg  650 mg Oral Q6H PRN Connye Burkitt, NP      . alum & mag hydroxide-simeth (MAALOX/MYLANTA) 200-200-20 MG/5ML suspension 30 mL  30 mL Oral Q4H PRN Connye Burkitt, NP      . antiseptic oral rinse (BIOTENE) solution 15 mL  15 mL Mouth Rinse PRN Sharma Covert, MD   15 mL at 01/28/20 0749  . divalproex (DEPAKOTE) DR tablet 250 mg  250 mg Oral TID Connye Burkitt, NP   250 mg at 01/28/20 0745  . FLUoxetine (PROZAC) capsule 10 mg  10 mg Oral Daily Sharma Covert, MD   10 mg at 01/28/20 0745  . hydrOXYzine (ATARAX/VISTARIL) tablet 25 mg  25 mg Oral TID PRN Connye Burkitt, NP   25 mg at 01/27/20 2148  . levothyroxine (SYNTHROID) tablet 100 mcg  100 mcg Oral Daily Connye Burkitt, NP   100 mcg at 01/28/20 0644  . loperamide (IMODIUM) capsule 2-4 mg  2-4 mg Oral PRN Connye Burkitt, NP      . LORazepam (ATIVAN) tablet 1 mg  1 mg Oral Q6H PRN Connye Burkitt, NP      . magnesium hydroxide (MILK OF MAGNESIA) suspension 30 mL  30 mL Oral Daily PRN Connye Burkitt, NP      . multivitamin with minerals tablet 1 tablet  1 tablet Oral Daily Connye Burkitt, NP   1 tablet at 01/28/20 0745  . OLANZapine (ZYPREXA) tablet 2.5 mg  2.5 mg Oral QHS Sharma Covert, MD   2.5 mg at 01/27/20 2148  . ondansetron (ZOFRAN-ODT) disintegrating tablet 4 mg  4 mg Oral Q6H PRN Connye Burkitt, NP      . pantoprazole (PROTONIX) EC tablet 40 mg  40 mg Oral Daily Connye Burkitt, NP   40 mg at 01/28/20 0744  . thiamine tablet 100 mg  100 mg Oral Daily Connye Burkitt,  NP   100 mg at 01/28/20 0745  . traZODone (DESYREL) tablet 50 mg  50 mg Oral QHS PRN,MR X 1 Connye Burkitt, NP   50 mg at 01/28/20 [id number]   PTA Medications: Medications Prior to Admission  Medication Sig Dispense Refill Last Dose  . Calcium Carbonate-Vit D-Min (CALCIUM 1200 PO) Take 1 tablet by mouth 2 (two) times daily.      Carin Hock Iron 15 MG CHEW Chew 1 tablet by mouth daily.      . chlordiazePOXIDE (LIBRIUM) 25 MG capsule Take 1 capsule (25 mg total) by mouth every 6 (six) hours as needed for withdrawal (CIWA score > 10). 30 capsule 0   . divalproex (DEPAKOTE) 250 MG DR tablet Take 1 tablet (250 mg total) by mouth 3 (three) times daily.     Marland Kitchen gabapentin (NEURONTIN) 300 MG capsule TAKE 1 TO 2 CAPSULES AT BEDTIME (Patient taking differently: Take 300 mg by mouth daily as needed (For back pain). ) 60 capsule 0   . levothyroxine (SYNTHROID) 100 MCG tablet TAKE ONE (1) TABLET EACH DAY (Patient taking differently: Take 100 mcg by mouth daily. ) 90 tablet 5   .  lubiprostone (AMITIZA) 24 MCG capsule TAKE 1 CAPSULE TWICE DAILY WITH A MEAL (Patient taking differently: Take 24 mcg by mouth 2 (two) times daily. ) 60 capsule 11   . Multiple Vitamin (MULTIVITAMIN WITH MINERALS) TABS tablet Take 1 tablet by mouth 2 (two) times daily. For Vitamin supplementation     . omeprazole (PRILOSEC) 40 MG capsule Take 1 capsule (40 mg total) by mouth 2 (two) times daily. TAKE ONE (1) CAPSULE EACH DAY (Patient taking differently: Take 40 mg by mouth 2 (two) times daily. ) 60 capsule 11   . thiamine 100 MG tablet Take 1 tablet (100 mg total) by mouth daily.     . vitamin B-12 (CYANOCOBALAMIN) 250 MCG tablet Take 250 mcg by mouth daily.       Patient Stressors: Health problems Loss of mother  Patient Strengths: Average or above average intelligence Capable of independent living Communication skills  Treatment Modalities: Medication Management, Group therapy, Case management,  1 to 1 session with clinician,  Psychoeducation, Recreational therapy.   Physician Treatment Plan for Primary Diagnosis: <principal problem not specified> Long Term Goal(s): Improvement in symptoms so as ready for discharge Improvement in symptoms so as ready for discharge   Short Term Goals: Ability to identify changes in lifestyle to reduce recurrence of condition will improve Ability to verbalize feelings will improve Ability to disclose and discuss suicidal ideas Ability to demonstrate self-control will improve Ability to identify and develop effective coping behaviors will improve Ability to maintain clinical measurements within normal limits will improve Compliance with prescribed medications will improve Ability to identify changes in lifestyle to reduce recurrence of condition will improve Ability to verbalize feelings will improve Ability to disclose and discuss suicidal ideas Ability to demonstrate self-control will improve Ability to identify and develop effective coping behaviors will improve Ability to maintain clinical measurements within normal limits will improve Compliance with prescribed medications will improve  Medication Management: Evaluate patient's response, side effects, and tolerance of medication regimen.  Therapeutic Interventions: 1 to 1 sessions, Unit Group sessions and Medication administration.  Evaluation of Outcomes: Progressing  Physician Treatment Plan for Secondary Diagnosis: Active Problems:   MDD (major depressive disorder), recurrent severe, without psychosis ([place])  Long Term Goal(s): Improvement in symptoms so as ready for discharge Improvement in symptoms so as ready for discharge   Short Term Goals: Ability to identify changes in lifestyle to reduce recurrence of condition will improve Ability to verbalize feelings will improve Ability to disclose and discuss suicidal ideas Ability to demonstrate self-control will improve Ability to identify and develop effective  coping behaviors will improve Ability to maintain clinical measurements within normal limits will improve Compliance with prescribed medications will improve Ability to identify changes in lifestyle to reduce recurrence of condition will improve Ability to verbalize feelings will improve Ability to disclose and discuss suicidal ideas Ability to demonstrate self-control will improve Ability to identify and develop effective coping behaviors will improve Ability to maintain clinical measurements within normal limits will improve Compliance with prescribed medications will improve     Medication Management: Evaluate patient's response, side effects, and tolerance of medication regimen.  Therapeutic Interventions: 1 to 1 sessions, Unit Group sessions and Medication administration.  Evaluation of Outcomes: Progressing   RN Treatment Plan for Primary Diagnosis: <principal problem not specified> Long Term Goal(s): Knowledge of disease and therapeutic regimen to maintain health will improve  Short Term Goals: Ability to verbalize feelings will improve and Ability to disclose and discuss suicidal ideas  Medication  Management: RN will administer medications as ordered by provider, will assess and evaluate patient's response and provide education to patient for prescribed medication. RN will report any adverse and/or side effects to prescribing provider.  Therapeutic Interventions: 1 on 1 counseling sessions, Psychoeducation, Medication administration, Evaluate responses to treatment, Monitor vital signs and CBGs as ordered, Perform/monitor CIWA, COWS, AIMS and Fall Risk screenings as ordered, Perform wound care treatments as ordered.  Evaluation of Outcomes: Progressing   LCSW Treatment Plan for Primary Diagnosis: <principal problem not specified> Long Term Goal(s): Safe transition to appropriate next level of care at discharge, Engage patient in therapeutic group addressing interpersonal  concerns.  Short Term Goals: Engage patient in aftercare planning with referrals and resources, Increase social support, Increase emotional regulation, Identify triggers associated with mental health/substance abuse issues and Increase skills for wellness and recovery  Therapeutic Interventions: Assess for all discharge needs, 1 to 1 time with Social worker, Explore available resources and support systems, Assess for adequacy in community support network, Educate family and significant other(s) on suicide prevention, Complete Psychosocial Assessment, Interpersonal group therapy.  Evaluation of Outcomes: Progressing  Progress in Treatment: Attending groups: No. Participating in groups: No. Taking medication as prescribed: Yes. Toleration medication: Yes. Family/Significant other contact made: No, will contact:  supports if consents are granted. Patient understands diagnosis: Yes. Discussing patient identified problems/goals with staff: Yes. Medical problems stabilized or resolved: No. Denies suicidal/homicidal ideation: Yes. Issues/concerns per patient self-inventory: Yes.  New problem(s) identified: Yes, Describe:  financial difficulities related to obtaining medications.  New Short Term/Long Term Goal(s): detox, medication management for mood stabilization; elimination of SI thoughts; development of comprehensive mental wellness/sobriety plan.  Patient Goals: "Get off of xanax."  Discharge Plan or Barriers: Patient reports he is discussion follow up plans with family. CSW continuing to follow for discharge planning.  Reason for Continuation of Hospitalization: Anxiety Depression Medical Issues Medication stabilization Withdrawal symptoms  Estimated Length of Stay: 3-5 days  Attendees: Patient: Marvin Espinoza 01/28/2020 9:46 AM  Physician: Queen Blossom 01/28/2020 9:46 AM  Nursing:  01/28/2020 9:46 AM  RN Care Manager: 01/28/2020 9:46 AM  Social Worker: Stephanie Acre, Latanya Presser 01/28/2020  9:46 AM  Recreational Therapist:  01/28/2020 9:46 AM  Other:  01/28/2020 9:46 AM  Other:  01/28/2020 9:46 AM  Other: 01/28/2020 9:46 AM    Scribe for Treatment Team: Joellen Jersey, [place] 01/28/2020 9:46 AM

## 2020-01-28 NOTE — Progress Notes (Signed)
   01/28/20 2200  Psych Admission Type (Psych Patients Only)  Admission Status Involuntary  Psychosocial Assessment  Patient Complaints Anxiety  Eye Contact Fair  Facial Expression Sullen;Sad;Worried  Affect Depressed;Sad;Sullen  Speech Logical/coherent  Interaction Assertive  Motor Activity Slow  Appearance/Hygiene Unremarkable  Behavior Characteristics Cooperative  Mood Depressed  Thought Process  Coherency WDL  Content WDL  Delusions None reported or observed  Perception WDL  Hallucination None reported or observed  Judgment Poor  Confusion None  Danger to Self  Current suicidal ideation? Denies  Danger to Others  Danger to Others None reported or observed
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
## 2020-01-28 NOTE — B[facility] Group Notes (Signed)
LCSW Aftercare Discharge Planning Group Note  01/28/2020   Type of Group and Topic: Psychoeducational Group: Discharge Planning  Participation Level: Active  Description of Group  Discharge planning group reviews patient's anticipated discharge plans and assists patients to anticipate and address any barriers to wellness/recovery in the community. Suicide prevention education is reviewed with patients in group.  Therapeutic Goals  1. Patients will state their anticipated discharge plan and mental health aftercare  2. Patients will identify potential barriers to wellness in the community setting  3. Patients will engage in problem solving, solution focused discussion of ways to anticipate and address barriers to wellness/recovery  Summary of Patient Progress  Plan for Discharge/Comments: Has an outpatient psychiatrist, who he will continue services with. He wants to start outpatient therapy with his wife.  Transportation Means: Has transportation.  Supports: Wife.  Therapeutic Modalities:  [place], [place]  01/28/2020 2:05 PM

## 2020-01-29 MED ORDER — GABAPENTIN 300 MG PO CAPS: ORAL_CAPSULE | ORAL | 0 refills | Status: DC

## 2020-01-29 MED ORDER — PROPRANOLOL HCL 10 MG PO TABS: 10.0000 mg | ORAL_TABLET | Freq: Every day | ORAL | 0 refills | Status: DC

## 2020-01-29 MED ORDER — VITAMIN B-12 250 MCG PO TABS: 250.0000 ug | ORAL_TABLET | Freq: Every day | ORAL | Status: AC

## 2020-01-29 MED ORDER — BIOTENE DRY MOUTH MT LIQD: 15.0000 mL | OROMUCOSAL | Status: DC | PRN

## 2020-01-29 MED ORDER — HYDROXYZINE HCL 25 MG PO TABS: 25.0000 mg | ORAL_TABLET | Freq: Three times a day (TID) | ORAL | 0 refills | Status: DC | PRN

## 2020-01-29 MED ORDER — FLUOXETINE HCL 10 MG PO CAPS: 10.0000 mg | ORAL_CAPSULE | Freq: Every day | ORAL | 0 refills | Status: DC

## 2020-01-29 MED ORDER — PROPRANOLOL HCL 20 MG PO TABS
20.0000 mg | ORAL_TABLET | Freq: Once | ORAL | Status: AC
  Administered 2020-01-29: 20 mg via ORAL
  Filled 2020-01-29: qty 1

## 2020-01-29 MED ORDER — THIAMINE HCL 100 MG PO TABS: 100.0000 mg | ORAL_TABLET | Freq: Every day | ORAL | Status: DC

## 2020-01-29 MED ORDER — PROPRANOLOL HCL 10 MG PO TABS
10.0000 mg | ORAL_TABLET | Freq: Every day | ORAL | Status: DC
  Filled 2020-01-29 (×3): qty 1

## 2020-01-29 MED ORDER — DIVALPROEX SODIUM 250 MG PO DR TAB: 250.0000 mg | DELAYED_RELEASE_TABLET | Freq: Three times a day (TID) | ORAL | 0 refills | Status: DC

## 2020-01-29 MED ORDER — OLANZAPINE 2.5 MG PO TABS: 2.5000 mg | ORAL_TABLET | Freq: Every day | ORAL | 0 refills | Status: DC

## 2020-01-29 MED ORDER — TRAZODONE HCL 50 MG PO TABS: 50.0000 mg | ORAL_TABLET | Freq: Every evening | ORAL | 0 refills | Status: DC | PRN

## 2020-01-29 NOTE — Progress Notes (Signed)
  [facility] Adult Case Management Discharge Plan :  Will you be returning to the same living situation after discharge:  Yes,  with wife At discharge, do you have transportation home?: Yes,  wife Do you have the ability to pay for your medications: Yes,  income and insurance  Release of information consent forms completed and emailed to Medical Records, then turned in to Medical Records by CSW.   Patient to Follow up at: Follow-up Information    Chucky May, MD Follow up on 02/02/2020.   Specialty: Psychiatry Why: You have an appointment for medication management 02/02/20 at 9:15 am.  This will be a virtual tele-health appointment.  The provider will contact you with appointment details prior to your appointment. Contact information: [address] [place] [id number] [phone]        Therapy referral to be made Follow up.   Why: Weekday social worker will make a referral for therapy and will call you on Monday 2/15 with that information.          Next level of care provider has access to [place] and Suicide Prevention discussed: Yes,  with wife     Has patient been referred to the Quitline?: N/A patient is not a smoker  Patient has been referred for addiction treatment: N/A  Maretta Los, LCSW 01/29/2020, 10:00 AM

## 2020-01-29 NOTE — Discharge Summary (Signed)
Physician Discharge Summary Note  Patient:  Marvin Espinoza is an 49 y.o., male  MRN:  [id number]  DOB:  [birth year]  Patient phone:  [phone] (home)   Patient address:   [address] [place] [address],   Total Time spent with patient: Greater than 30 minutes  Date of Admission:  01/26/2020  Date of Discharge: 01-28-19  Reason for Admission: Intentional overdose on10 Xanax & 10 Flexeril tablets in a suicide attempt.  Principal Problem: MDD (major depressive disorder), recurrent severe, without psychosis ([place])  Discharge Diagnoses: Principal Problem:   MDD (major depressive disorder), recurrent severe, without psychosis ([place])  Past Psychiatric History: Major depressive disorder, recurrent episodes.  Past Medical History:  Past Medical History:  Diagnosis Date  . Anxiety   . Bipolar disorder ([place])   . CTS (carpal tunnel syndrome)   . Depression   . GERD (gastroesophageal reflux disease)   . H/O bariatric surgery   . Head injury 05/06/2017  . Hypothyroidism   . Hypothyroidism   . Sleep apnea    no need for CPAP at this time  . Sleep apnea     Past Surgical History:  Procedure Laterality Date  . ANAL FISSURE REPAIR    . BARIATRIC SURGERY    . CARPAL TUNNEL RELEASE Left 05/09/2015   Procedure: LEFT CARPAL TUNNEL RELEASE;  Surgeon: Daryll Brod, MD;  Location: [facility];  Service: Orthopedics;  Laterality: Left;  . CARPAL TUNNEL RELEASE Left 05-09-2015  . CARPAL TUNNEL RELEASE Right 06/20/2015   Procedure: RIGHT CARPAL TUNNEL RELEASE;  Surgeon: Daryll Brod, MD;  Location: [facility];  Service: Orthopedics;  Laterality: Right;  REGIONAL/FAB  . CHOLECYSTECTOMY    . COLONOSCOPY    . UPPER GASTROINTESTINAL ENDOSCOPY     Family History:  Family History  Problem Relation Age of Onset  . Diabetes Mother   . Heart disease Mother   . Depression Mother   . Esophageal cancer Mother   . COPD Father   . Heart disease Father   . Kidney disease  Father   . Mental illness Father   . Depression Father   . Mental illness Sister   . Depression Sister   . Esophageal cancer Maternal Uncle   . Colon cancer Neg Hx   . Rectal cancer Neg Hx   . Stomach cancer Neg Hx    Family Psychiatric  History: See H&P  Social History:  Social History   Substance and Sexual Activity  Alcohol Use No     Social History   Substance and Sexual Activity  Drug Use No    Social History   Socioeconomic History  . Marital status: Married    Spouse name: Not on file  . Number of children: 1  . Years of education: Not on file  . Highest education level: Not on file  Occupational History  . Not on file  Tobacco Use  . Smoking status: Never Smoker  . Smokeless tobacco: Never Used  Substance and Sexual Activity  . Alcohol use: No  . Drug use: No  . Sexual activity: Yes  Other Topics Concern  . Not on file  Social History Narrative   ** Merged History Encounter **       Social Determinants of Health   Financial Resource Strain:   . Difficulty of Paying Living Expenses: Not on file  Food Insecurity:   . Worried About Charity fundraiser in the Last Year: Not on file  .  Ran Out of Food in the Last Year: Not on file  Transportation Needs:   . Lack of Transportation (Medical): Not on file  . Lack of Transportation (Non-Medical): Not on file  Physical Activity:   . Days of Exercise per Week: Not on file  . Minutes of Exercise per Session: Not on file  Stress:   . Feeling of Stress : Not on file  Social Connections:   . Frequency of Communication with Friends and Family: Not on file  . Frequency of Social Gatherings with Friends and Family: Not on file  . Attends Religious Services: Not on file  . Active Member of Clubs or Organizations: Not on file  . Attends Archivist Meetings: Not on file  . Marital Status: Not on file   Hospital Course: (Per Md's admission assessment notes): Patient is a 49 year old male with a past  psychiatric history significant for major depression versus bipolar disorder and a past medical history significant for degenerative disc disease, peripheral neuropathy, diabetes, hypothyroidism, GERD, obstructive sleep apnea, chronic pain and history of bariatric surgery who originally presented to the [facility] emergency department on 01/25/2020 after an intentional overdose of 10 Xanax and 10 Flexeril tablets. The patient stated that he had been under increased psychosocial stressors over the last several years. The patient went into a long dialogue about the fact that his mother was diagnosed with cancer in 2019, and then expired in 2020. He stated that he had to empty his mother's double wide, and that his sister was living there and she had some reported substance issues. He stated that after his sister had moved all the things out of their home recently that he spoke to his wife and 50 year old son that to not be surprised if he was not around the next morning. He stated he did not want to blame them. He took the overdose at home and EMS was called. He was asleep but when he woke up apparently became combative, and had to give 5 mg of Haldol. He was hospitalized in the medical hospital on 2/9, and psychiatric consultation was obtained on 01/26/2020. The patient was discharged from the medical hospital on 2/10, and transferred to our facility. The patient also stated that when he signed up for insurance this year apparently there was some problem. He stated his previous insurance had paid for all of his medications, but he was unable to afford the medication cost this year with his new insurance. He stated that he had stopped taking his Depakote sometime ago, and restarted it last weekend. He stated he sees Dr. Kerry Hough medication management. His current psychiatric medications include Xanax, Depakote, gabapentin and Ambien. He admitted to 2 previous suicide attempts and was hospitalized. He is  been on multiple medications in the past and has had side effects or lack of efficacy with these medicines. He stated that he had allergies to Abilify, Vraylar, Latuda,Risperdal and Seroquel. On his last psychiatric hospitalization to our facility in October 2019 he was discharged on the combination drug of olanzapine with fluoxetine. He could not recall having any problems with that medication, but also stated he was unable to afford it. He is unsure what the cost of that medication is currently. His additional psychosocial stressor which he went into detail about is his inability to find a job. 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 Dr. Hulda Marin also discussed with the patient the possibility of ECT in the past. He was admitted to the hospital for evaluation and stabilization.  This is one of Marvin Espinoza's several discharge summaries from this [facility]hospital. He is known in this hospital with problems related to mental health issues . He has had several admission assessments & discharges over the last 3 years from this hospital alone. He was admitted to this hospital this time around for attempted suicide by intentional overdose on Xanax & Flexeril tablets.  His UDS test result on admission was positive for Benzodiazepine. He was presenting with no substance withdrawal symptoms. He was recommended for mood stabilization treatments after evaluation of his presenting symptoms. Then, the medication regimen for his presenting symptoms were discussed & with his consent, initiated. He was medicated, stabilized & discharged on the medications as listed below on his discharge medication lists. He received other medication regimen for the other pre-existing medical conditions presented. He tolerated his treatment regimen without any  adverse effects or reactions reported.   During the course of his hospitalization, Kree was also enrolled & participated in the group counseling sessions being offered & held on this unit. He learned coping skills that should help him cope better & maintain mood stability after discharged. His symptoms responded well to his treatments regimen & his mood is now stable. This is evidenced by his reports of improved mood, reduction of symptoms, absence of suicidal ideations & presentation of good affect. He is currently being discharged to his home with family to follow-up care for routine psychiatric treatment & medication management on an outpatient basis as noted below. He is provided with all the necessary information required to make this appointment without problems.   Upon discharge, Danyael adamantly denies any SIHI, AVH, delusional thoughts or paranoia. He was able to engage in safety planning including plan to return to [facility] or contact emergency services if he feels unable to maintain his own safety or the safety of others. Pt had no further questions, comments, or concerns. He left [facility] with all personal belongings in no apparent distress. Transportation per wife.  Physical Findings: AIMS: Facial and Oral Movements Muscles of Facial Expression: None, normal Lips and Perioral Area: None, normal Jaw: None, normal Tongue: None, normal,Extremity Movements Upper (arms, wrists, hands, fingers): None, normal Lower (legs, knees, ankles, toes): None, normal, Trunk Movements Neck, shoulders, hips: None, normal, Overall Severity Severity of abnormal movements (highest score from questions above): None, normal Incapacitation due to abnormal movements: None, normal Patient's awareness of abnormal movements (rate only patient's report): No Awareness, Dental Status Current problems with teeth and/or dentures?: No Does patient usually wear dentures?: No  CIWA:  CIWA-Ar Total: 0 COWS:      Musculoskeletal: Strength & Muscle Tone: within normal limits Gait & Station: normal Patient leans: N/A  Psychiatric Specialty Exam: Physical Exam  Nursing note and vitals reviewed. Constitutional: He is oriented to person, place, and time. He appears well-developed.  Cardiovascular:  Hx. HTN :142/101 Pulse: 105  Respiratory: Effort normal.  Genitourinary:    Genitourinary Comments: Deferred   Musculoskeletal:        General: Normal range of motion.     Cervical back: Normal range of motion.  Neurological: He is alert and oriented to person, place, and time.  Skin: Skin is warm and dry.    Review of Systems  Constitutional: Negative for chills, diaphoresis and fever.  HENT: Negative for congestion, rhinorrhea, sneezing and sore throat.   Respiratory: Negative for cough, shortness of breath and wheezing.   Cardiovascular: Negative for chest pain and palpitations.       Hx. HTN  Gastrointestinal: Negative for diarrhea, nausea and vomiting.  Genitourinary: Negative for difficulty urinating.  Musculoskeletal: Negative.   Skin: Negative for color change.  Allergic/Immunologic: Negative for environmental allergies and food allergies.       Hx. Allergic to multiple psychotropic medications.  Neurological: Negative for dizziness, tremors, numbness and headaches.  Psychiatric/Behavioral: Positive for dysphoric mood (Stabilized with medication prior to discharge) and sleep disturbance (Stabilized with medication prior to discharge). Negative for agitation, behavioral problems, confusion, decreased concentration, hallucinations, self-injury and suicidal ideas. The patient is not nervous/anxious (Stable) and is not hyperactive.     Blood pressure (!) 152/94, pulse 100, temperature 98.1 F (36.7 C), temperature source Oral, resp. rate 18, height 5\' 10"  (1.778 m), weight 107 kg, SpO2 99 %.Body mass index is 33.86 kg/m.  See Md's discharge SRA  Sleep:  Number of Hours: 6.25   Has this  patient used any form of tobacco in the last 30 days? (Cigarettes, Smokeless Tobacco, Cigars, and/or Pipes): N/A  Blood Alcohol level:  Lab Results  Component Value Date   ETH <10 01/25/2020   ETH <10 123456   Metabolic Disorder Labs:  Lab Results  Component Value Date   HGBA1C 6.3 (H) 01/27/2020   MPG 134.11 01/27/2020   MPG 146 10/06/2018   No results found for: PROLACTIN Lab Results  Component Value Date   CHOL 142 01/27/2020   TRIG 99 01/27/2020   HDL 37 (L) 01/27/2020   CHOLHDL 3.8 01/27/2020   VLDL 20 01/27/2020   LDLCALC 85 01/27/2020   LDLCALC 76 08/20/2019   See Psychiatric Specialty Exam and Suicide Risk Assessment completed by Attending Physician prior to discharge.  Discharge destination:  Home  Is patient on multiple antipsychotic therapies at discharge:  No   Has Patient had three or more failed trials of antipsychotic monotherapy by history:  No  Recommended Plan for Multiple Antipsychotic Therapies: NA  Allergies as of 01/29/2020      Reactions   Aripiprazole Anaphylaxis, Swelling   "slurred speech"   Cariprazine Hcl Other (See Comments)   Latuda  [lurasidone Hcl] Other (See Comments)   Seroquel [quetiapine Fumarate]    tardive dyskinesia   Alcohol Itching   Drinking alcohol   Cariprazine Other (See Comments)   Tardive dyskenesia   Hydrocodone-acetaminophen Itching   Lurasidone Other (See Comments)   dysconesia   Victoza [liraglutide] Other (See Comments)   Severe heartburn   Victoza [liraglutide]    Severe heartburn   Bupropion Rash   Risperidone Rash      Medication List    STOP taking these medications   CALCIUM 1200 PO   Carbonyl Iron 15 MG Chew   chlordiazePOXIDE 25 MG capsule Commonly known as: LIBRIUM   gabapentin 300 MG capsule Commonly known as: NEURONTIN   lubiprostone 24 MCG capsule Commonly known as: Amitiza     TAKE these medications     Indication  antiseptic oral rinse Liqd 15 mLs by Mouth Rinse route as  needed for dry mouth.  Indication: Dry mouth   divalproex 250 MG DR tablet Commonly known as: DEPAKOTE Take 1 tablet (250 mg total) by mouth 3 (three) times daily. For mood stabilization What changed: additional instructions  Indication: Mood stabilization   FLUoxetine 10 MG capsule Commonly known as:  PROZAC Take 1 capsule (10 mg total) by mouth daily. For depression Start taking on: January 30, 2020  Indication: Depression   hydrOXYzine 25 MG tablet Commonly known as: ATARAX/VISTARIL Take 1 tablet (25 mg total) by mouth 3 (three) times daily as needed for anxiety.  Indication: Feeling Anxious   levothyroxine 100 MCG tablet Commonly known as: SYNTHROID TAKE ONE (1) TABLET EACH DAY What changed:   how much to take  how to take this  when to take this  additional instructions  Indication: Underactive Thyroid   multivitamin with minerals Tabs tablet Take 1 tablet by mouth 2 (two) times daily. For Vitamin supplementation  Indication: Vitamin supplement   OLANZapine 2.5 MG tablet Commonly known as: ZYPREXA Take 1 tablet (2.5 mg total) by mouth at bedtime. For mood control  Indication: Mood control   omeprazole 40 MG capsule Commonly known as: PRILOSEC Take 1 capsule (40 mg total) by mouth 2 (two) times daily. TAKE ONE (1) CAPSULE EACH DAY What changed: additional instructions  Indication: Per PCP   propranolol 10 MG tablet Commonly known as: INDERAL Take 1 tablet (10 mg total) by mouth daily at 6 (six) AM. For high blood pressure Start taking on: January 30, 2020  Indication: High Blood Pressure Disorder   thiamine 100 MG tablet Take 1 tablet (100 mg total) by mouth daily. For low thiamine What changed: additional instructions  Indication: Deficiency of Vitamin B1   traZODone 50 MG tablet Commonly known as: DESYREL Take 1 tablet (50 mg total) by mouth at bedtime as needed for sleep.  Indication: Trouble Sleeping   vitamin B-12 250 MCG tablet Commonly  known as: CYANOCOBALAMIN Take 1 tablet (250 mcg total) by mouth daily. For low B-12 supplementation What changed: additional instructions  Indication: Inadequate Vitamin B12, Per PCP      Follow-up Information    Chucky May, MD Follow up on 02/02/2020.   Specialty: Psychiatry Why: You have an appointment for medication management 02/02/20 at 9:15 am.  This will be a virtual tele-health appointment.  The provider will contact you with appointment details prior to your appointment. Contact information: [address] [place] [address] [phone]        Therapy referral to be made Follow up.   Why: Weekday social worker will make a referral for therapy and will call you on Monday 2/15 with that information.         Follow-up recommendations: Activity:  As tolerated Diet: As recommended by your primary care doctor. Keep all scheduled follow-up appointments as recommended.   Comments: Prescriptions given at discharge.  Patient agreeable to plan.  Given opportunity to ask questions.  Appears to feel comfortable with discharge denies any current suicidal or homicidal thought. Patient is also instructed prior to discharge to: Take all medications as prescribed by his/her mental healthcare provider. Report any adverse effects and or reactions from the medicines to his/her outpatient provider promptly. Patient has been instructed & cautioned: To not engage in alcohol and or illegal drug use while on prescription medicines. In the event of worsening symptoms, patient is instructed to call the crisis hotline, 911 and or go to the nearest ED for appropriate evaluation and treatment of symptoms. To follow-up with his/her primary care provider for your other medical issues, concerns and or health care needs.  Signed: Lindell Spar, NP, PMHNP, FNP-BC 01/29/2020, 10:14 AM

## 2020-01-29 NOTE — BHH Group Notes (Signed)
LCSW Group Therapy Note  01/29/2020   10:00-11:00am   Type of Therapy and Topic:  Group Therapy: Anger Cues and Responses  Participation Level:  Active   Description of Group:   In this group, patients learned how to recognize the physical, cognitive, emotional, and behavioral responses they have to anger-provoking situations.  They identified a recent time they became angry and how they reacted.  They analyzed how their reaction was possibly beneficial and how it was possibly unhelpful.  The group discussed a variety of healthier coping skills that could help with such a situation in the future.  Focus was placed on how helpful it is to recognize the underlying emotions to our anger, because working on those can lead to a more permanent solution as well as our ability to focus on the important rather than the urgent.  Therapeutic Goals: Patients will remember their last incident of anger and how they felt emotionally and physically, what their thoughts were at the time, and how they behaved. Patients will identify how their behavior at that time worked for them, as well as how it worked against them. Patients will explore possible new behaviors to use in future anger situations. Patients will learn that anger itself is normal and cannot be eliminated, and that healthier reactions can assist with resolving conflict rather than worsening situations.  Summary of Patient Progress:  The patient shared that his most recent time of anger was this morning with he felt that an MHT "jumped" on him, slammed a door and was disrespectful not only to him but to others.  He said his reaction was to confront the MHT and "shoot her the bird."  He listened attentively throughout group.  Therapeutic Modalities:   Cognitive Behavioral Therapy  Maretta Los

## 2020-01-29 NOTE — BHH Suicide Risk Assessment (Signed)
[facility] Discharge Suicide Risk Assessment   Principal Problem: <principal problem not specified> Discharge Diagnoses: Active Problems:   MDD (major depressive disorder), recurrent severe, without psychosis ([place])   Total Time spent with patient: 20 minutes  Musculoskeletal: Strength & Muscle Tone: within normal limits Gait & Station: normal Patient leans: N/A  Psychiatric Specialty Exam: Review of Systems  All other systems reviewed and are negative.   Blood pressure (!) 142/101, pulse (!) 105, temperature 98.1 F (36.7 C), temperature source Oral, resp. rate 18, height 5\' 10"  (1.778 m), weight 107 kg, SpO2 99 %.Body mass index is 33.86 kg/m.  General Appearance: Casual  Eye Contact::  Fair  Speech:  Normal Rate409  Volume:  Normal  Mood:  Euthymic  Affect:  Congruent  Thought Process:  Coherent and Descriptions of Associations: Intact  Orientation:  Full (Time, Place, and Person)  Thought Content:  Logical  Suicidal Thoughts:  No  Homicidal Thoughts:  No  Memory:  Immediate;   Good Recent;   Good Remote;   Good  Judgement:  Intact  Insight:  Fair  Psychomotor Activity:  Normal  Concentration:  Good  Recall:  Good  Fund of Knowledge:Good  Language: Good  Akathisia:  Negative  Handed:  Right  AIMS (if indicated):     Assets:  Communication Skills Desire for Improvement Housing Resilience Social Support  Sleep:  Number of Hours: 6.25  Cognition: WNL  ADL's:  Intact   Mental Status Per Nursing Assessment::   On Admission:  Suicidal ideation indicated by patient  Demographic Factors:  Male, Caucasian, Low socioeconomic status and Unemployed  Loss Factors: NA  Historical Factors: Impulsivity  Risk Reduction Factors:   Sense of responsibility to family, Living with another person, especially a relative and Positive therapeutic relationship  Continued Clinical Symptoms:  Bipolar Disorder:   Mixed State  Cognitive Features That Contribute To Risk:  None     Suicide Risk:  Minimal: No identifiable suicidal ideation.  Patients presenting with no risk factors but with morbid ruminations; may be classified as minimal risk based on the severity of the depressive symptoms  Follow-up Information    Chucky May, MD Follow up on 02/02/2020.   Specialty: Psychiatry Why: You have an appointment for medication management 02/02/20 at 9:15 am.  This will be a virtual tele-health appointment.  The provider will contact you with appointment details prior to your appointment. Contact information: [address] [place] [address] [phone]           Plan Of Care/Follow-up recommendations:  Activity:  ad lib Tests:  See PCP and have your depakote level, liver function enzymes and complete blood cell court done within 7 days after discharge. Have these results forwarded to Dr Toy Care for review.  Sharma Covert, MD 01/29/2020, 9:37 AM

## 2020-01-29 NOTE — Progress Notes (Signed)
  Pt denies SI/HI. Pt received both written and verbal discharge instructions. Pt verbalized understanding of discharge instructions. The pt agreed to f/u appt and med regimen. Pt received a SRA, AVS, transitional record and prescriptions. Pt gathered belongings from room and locker. Pt safely escorted to the lobby by [employer]. MHT.

## 2020-01-29 NOTE — [facility] Suicide Risk Assessment (Signed)
[place] INPATIENT:  Family/Significant Other Suicide Prevention Education  Suicide Prevention Education:  Education Completed; wife, Uyless Safron [phone]),  (name of family member/significant other) has been identified by the patient as the family member/significant other with whom the patient will be residing, and identified as the person(s) who will aid the patient in the event of a mental health crisis (suicidal ideations/suicide attempt).  With written consent from the patient, the family member/significant other has been provided the following suicide prevention education, prior to the and/or following the discharge of the patient.  Wife asked whether the patient has been learning coping skills to deal with his anger, and was told he has been attending groups during his hospital stay to do just that.  There are firearms in the home which are secured but to which he has access, and she agreed to get those out of the house before she comes to pick him up.  A therapy appointment has not yet been set, and will be needed.  A note is being left for weekday social workers to line this up and call patient on Monday.  The suicide prevention education provided includes the following:  Suicide risk factors  Suicide prevention and interventions  National Suicide Hotline telephone number  [facility] assessment telephone number  [facility] Emergency Assistance [place] and/or Residential Mobile Crisis Unit telephone number  Request made of family/significant other to:  Remove weapons (e.g., guns, rifles, knives), all items previously/currently identified as safety concern.    Remove drugs/medications (over-the-counter, prescriptions, illicit drugs), all items previously/currently identified as a safety concern.  The family member/significant other verbalizes understanding of the suicide prevention education information provided.  The family member/significant other  agrees to remove the items of safety concern listed above.  Berlin Hun Grossman-Orr 01/29/2020, 9:58 AM

## 2020-01-31 ENCOUNTER — Other Ambulatory Visit: Payer: Self-pay | Admitting: Physician Assistant

## 2020-02-02 ENCOUNTER — Ambulatory Visit: Payer: BLUE CROSS/BLUE SHIELD | Admitting: Physician Assistant

## 2020-02-02 ENCOUNTER — Telehealth: Payer: Self-pay | Admitting: Physician Assistant

## 2020-02-02 ENCOUNTER — Other Ambulatory Visit: Payer: Self-pay | Admitting: Physician Assistant

## 2020-02-02 DIAGNOSIS — E039 Hypothyroidism, unspecified: Secondary | ICD-10-CM

## 2020-02-02 DIAGNOSIS — Z0000 Encounter for general adult medical examination without abnormal findings: Secondary | ICD-10-CM

## 2020-02-02 DIAGNOSIS — F3132 Bipolar disorder, current episode depressed, moderate: Secondary | ICD-10-CM | POA: Diagnosis not present

## 2020-02-02 DIAGNOSIS — F315 Bipolar disorder, current episode depressed, severe, with psychotic features: Secondary | ICD-10-CM

## 2020-02-02 NOTE — Telephone Encounter (Signed)
Patient aware.

## 2020-02-02 NOTE — Telephone Encounter (Signed)
Orders are placed 

## 2020-02-05 DIAGNOSIS — Z20828 Contact with and (suspected) exposure to other viral communicable diseases: Secondary | ICD-10-CM | POA: Diagnosis not present

## 2020-02-14 ENCOUNTER — Ambulatory Visit (INDEPENDENT_AMBULATORY_CARE_PROVIDER_SITE_OTHER): Payer: BC Managed Care – PPO | Admitting: Licensed Clinical Social Worker

## 2020-02-14 ENCOUNTER — Other Ambulatory Visit: Payer: Self-pay

## 2020-02-14 DIAGNOSIS — F4321 Adjustment disorder with depressed mood: Secondary | ICD-10-CM

## 2020-02-14 DIAGNOSIS — F322 Major depressive disorder, single episode, severe without psychotic features: Secondary | ICD-10-CM | POA: Diagnosis not present

## 2020-02-14 DIAGNOSIS — F411 Generalized anxiety disorder: Secondary | ICD-10-CM | POA: Diagnosis not present
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
## 2020-02-15 ENCOUNTER — Encounter: Payer: Self-pay | Admitting: Physician Assistant

## 2020-02-15 ENCOUNTER — Ambulatory Visit (INDEPENDENT_AMBULATORY_CARE_PROVIDER_SITE_OTHER): Payer: BC Managed Care – PPO | Admitting: Physician Assistant

## 2020-02-15 VITALS — BP 131/88 | HR 74 | Temp 99.8°F | Ht 70.0 in | Wt 231.0 lb

## 2020-02-15 DIAGNOSIS — R3911 Hesitancy of micturition: Secondary | ICD-10-CM

## 2020-02-15 DIAGNOSIS — Z0000 Encounter for general adult medical examination without abnormal findings: Secondary | ICD-10-CM

## 2020-02-15 DIAGNOSIS — F315 Bipolar disorder, current episode depressed, severe, with psychotic features: Secondary | ICD-10-CM | POA: Diagnosis not present

## 2020-02-15 DIAGNOSIS — E039 Hypothyroidism, unspecified: Secondary | ICD-10-CM

## 2020-02-15 DIAGNOSIS — N401 Enlarged prostate with lower urinary tract symptoms: Secondary | ICD-10-CM

## 2020-02-15 LAB — BAYER DCA HB A1C WAIVED: HB A1C (BAYER DCA - WAIVED): 6.8 % (ref <7.0)

## 2020-02-15 MED ORDER — ALBUTEROL SULFATE HFA 108 (90 BASE) MCG/ACT IN AERS: 2.0000 | INHALATION_SPRAY | Freq: Four times a day (QID) | RESPIRATORY_TRACT | 0 refills | Status: DC | PRN

## 2020-02-15 NOTE — Progress Notes (Signed)
BP 131/88   Pulse 74   Temp 99.8 F (37.7 C)   Ht '5\' 10"'  (1.778 m)   Wt 231 lb (104.8 kg)   SpO2 97%   BMI 33.15 kg/m    Subjective:    Patient ID: Marvin Espinoza, male    DOB: 11/16/1971, 49 y.o.   MRN: [id number]  Thyroid Problem Presents for follow-up visit. Symptoms include depressed mood and fatigue. Patient reports no diarrhea, hair loss, leg swelling, palpitations, weight gain or weight loss. The symptoms have been stable.  Depression        This is a chronic problem.  The current episode started more than 1 year ago. The problem is unchanged.  Associated symptoms include decreased concentration, fatigue and restlessness.  Associated symptoms include no appetite change and no headaches.  Compliance with treatment is good.  Risk factors include family history of mental illness, family history, history of mental illness and history of suicide attempt.   Past medical history includes thyroid problem.   Dysuria  This is a chronic problem. The current episode started 1 to 4 weeks ago. The problem has been waxing and waning. Pertinent negatives include no nausea or vomiting.   1. Hypothyroidism, unspecified type  2. Bipolar disorder, current episode depressed, severe, with psychotic features ([place])  3. Well adult exam  4. Benign prostatic hyperplasia with urinary hesitancy   HPI: Marvin Espinoza is a 49 y.o. male presenting on 02/15/2020 for No chief complaint on file.    Past Medical History:  Diagnosis Date  . Anxiety   . Bipolar disorder ([place])   . CTS (carpal tunnel syndrome)   . Depression   . GERD (gastroesophageal reflux disease)   . H/O bariatric surgery   . Head injury 05/06/2017  . Hypothyroidism   . Hypothyroidism   . Sleep apnea    no need for CPAP at this time  . Sleep apnea    Relevant past medical, surgical, family and social history reviewed and updated as indicated. Interim medical history since our last visit reviewed. Allergies and medications  reviewed and updated. DATA REVIEWED: CHART IN EPIC  Family History reviewed for pertinent findings.  Review of Systems  Constitutional: Positive for fatigue. Negative for appetite change, weight gain and weight loss.  HENT: Negative.   Eyes: Negative.  Negative for pain and visual disturbance.  Respiratory: Negative.  Negative for cough, chest tightness, shortness of breath and wheezing.   Cardiovascular: Negative.  Negative for chest pain, palpitations and leg swelling.  Gastrointestinal: Negative.  Negative for abdominal pain, diarrhea, nausea and vomiting.  Endocrine: Negative.   Genitourinary: Positive for dysuria.  Musculoskeletal: Negative.   Skin: Negative.  Negative for color change and rash.  Neurological: Negative.  Negative for weakness, numbness and headaches.  Psychiatric/Behavioral: Positive for decreased concentration and depression.    Allergies as of 02/15/2020      Reactions   Aripiprazole Anaphylaxis, Swelling   "slurred speech"   Cariprazine Hcl Other (See Comments)   Latuda  [lurasidone Hcl] Other (See Comments)   Seroquel [quetiapine Fumarate]    tardive dyskinesia   Alcohol Itching   Drinking alcohol   Cariprazine Other (See Comments)   Tardive dyskenesia   Hydrocodone-acetaminophen Itching   Lurasidone Other (See Comments)   dysconesia   Victoza [liraglutide] Other (See Comments)   Severe heartburn   Victoza [liraglutide]    Severe heartburn   Bupropion Rash   Risperidone Rash      Medication List  Accurate as of February 15, 2020 11:59 PM. If you have any questions, ask your nurse or doctor.        albuterol 108 (90 Base) MCG/ACT inhaler Commonly known as: VENTOLIN HFA Inhale 2 puffs into the lungs every 6 (six) hours as needed for wheezing or shortness of breath. Started by: Terald Sleeper, PA-C   ALPRAZolam 1 MG tablet Commonly known as: XANAX   antiseptic oral rinse Liqd 15 mLs by Mouth Rinse route as needed for dry mouth.     divalproex 250 MG DR tablet Commonly known as: DEPAKOTE Take 1 tablet (250 mg total) by mouth 3 (three) times daily. For mood stabilization What changed:   how much to take  when to take this   FLUoxetine 10 MG capsule Commonly known as: PROZAC Take 1 capsule (10 mg total) by mouth daily. For depression   gabapentin 300 MG capsule Commonly known as: NEURONTIN TAKE 1 TO 2 CAPSULES AT BEDTIME   hydrOXYzine 25 MG tablet Commonly known as: ATARAX/VISTARIL Take 1 tablet (25 mg total) by mouth 3 (three) times daily as needed for anxiety.   levothyroxine 100 MCG tablet Commonly known as: SYNTHROID TAKE ONE (1) TABLET EACH DAY What changed:   how much to take  how to take this  when to take this  additional instructions   multivitamin with minerals Tabs tablet Take 1 tablet by mouth 2 (two) times daily. For Vitamin supplementation   OLANZapine 2.5 MG tablet Commonly known as: ZYPREXA Take 1 tablet (2.5 mg total) by mouth at bedtime. For mood control   omeprazole 40 MG capsule Commonly known as: PRILOSEC Take 1 capsule (40 mg total) by mouth 2 (two) times daily. TAKE ONE (1) CAPSULE EACH DAY What changed: additional instructions   propranolol 10 MG tablet Commonly known as: INDERAL Take 1 tablet (10 mg total) by mouth daily at 6 (six) AM. For high blood pressure   thiamine 100 MG tablet Take 1 tablet (100 mg total) by mouth daily. For low thiamine   traZODone 50 MG tablet Commonly known as: DESYREL Take 1 tablet (50 mg total) by mouth at bedtime as needed for sleep.   vitamin B-12 250 MCG tablet Commonly known as: CYANOCOBALAMIN Take 1 tablet (250 mcg total) by mouth daily. For low B-12 supplementation          Objective:    BP 131/88   Pulse 74   Temp 99.8 F (37.7 C)   Ht '5\' 10"'  (1.778 m)   Wt 231 lb (104.8 kg)   SpO2 97%   BMI 33.15 kg/m   Allergies  Allergen Reactions  . Aripiprazole Anaphylaxis and Swelling    "slurred speech"   .  Cariprazine Hcl Other (See Comments)  . Latuda  [Lurasidone Hcl] Other (See Comments)  . Seroquel [Quetiapine Fumarate]     tardive dyskinesia  . Alcohol Itching    Drinking alcohol  . Cariprazine Other (See Comments)    Tardive dyskenesia  . Hydrocodone-Acetaminophen Itching  . Lurasidone Other (See Comments)    dysconesia  . Victoza [Liraglutide] Other (See Comments)    Severe heartburn   . Victoza [Liraglutide]     Severe heartburn  . Bupropion Rash  . Risperidone Rash    Wt Readings from Last 3 Encounters:  02/15/20 231 lb (104.8 kg)  06/14/19 238 lb 1.6 oz (108 kg)  01/05/19 239 lb 3.2 oz (108.5 kg)    Physical Exam Vitals and nursing note reviewed.  Constitutional:  General: He is not in acute distress.    Appearance: He is well-developed.  HENT:     Head: Normocephalic and atraumatic.  Eyes:     Conjunctiva/sclera: Conjunctivae normal.     Pupils: Pupils are equal, round, and reactive to light.  Cardiovascular:     Rate and Rhythm: Normal rate and regular rhythm.     Heart sounds: Normal heart sounds.  Pulmonary:     Effort: Pulmonary effort is normal. No respiratory distress.     Breath sounds: Normal breath sounds.  Skin:    General: Skin is warm and dry.  Psychiatric:        Behavior: Behavior normal.     Results for orders placed or performed in visit on 02/15/20  Bayer DCA Hb A1c Waived  Result Value Ref Range   HB A1C (BAYER DCA - WAIVED) 6.8 <7.0 %  TSH  Result Value Ref Range   TSH 8.020 (H) 0.450 - 4.500 uIU/mL  Lipid panel  Result Value Ref Range   Cholesterol, Total 108 100 - 199 mg/dL   Triglycerides 97 0 - 149 mg/dL   HDL 34 (L) >39 mg/dL   VLDL Cholesterol Cal 19 5 - 40 mg/dL   LDL Chol Calc (NIH) 55 0 - 99 mg/dL   Chol/HDL Ratio 3.2 0.0 - 5.0 ratio  CMP14+EGFR  Result Value Ref Range   Glucose 158 (H) 65 - 99 mg/dL   BUN 11 6 - 24 mg/dL   Creatinine, Ser 0.80 0.76 - 1.27 mg/dL   GFR calc non Af Amer 104 >59 mL/min/1.73    GFR calc Af Amer 120 >59 mL/min/1.73   BUN/Creatinine Ratio 14 9 - 20   Sodium 144 134 - 144 mmol/L   Potassium 4.2 3.5 - 5.2 mmol/L   Chloride 104 96 - 106 mmol/L   CO2 25 20 - 29 mmol/L   Calcium 8.7 8.7 - 10.2 mg/dL   Total Protein 5.7 (L) 6.0 - 8.5 g/dL   Albumin 3.9 (L) 4.0 - 5.0 g/dL   Globulin, Total 1.8 1.5 - 4.5 g/dL   Albumin/Globulin Ratio 2.2 1.2 - 2.2   Bilirubin Total 0.3 0.0 - 1.2 mg/dL   Alkaline Phosphatase 61 39 - 117 IU/L   AST 19 0 - 40 IU/L   ALT 20 0 - 44 IU/L  CBC with Differential/Platelet  Result Value Ref Range   WBC 6.3 3.4 - 10.8 x10E3/uL   RBC 4.25 4.14 - 5.80 x10E6/uL   Hemoglobin 12.8 (L) 13.0 - 17.7 g/dL   Hematocrit 37.8 37.5 - 51.0 %   MCV 89 79 - 97 fL   MCH 30.1 26.6 - 33.0 pg   MCHC 33.9 31.5 - 35.7 g/dL   RDW 13.4 11.6 - 15.4 %   Platelets 409 150 - 450 x10E3/uL   Neutrophils 46 Not Estab. %   Lymphs 43 Not Estab. %   Monocytes 8 Not Estab. %   Eos 1 Not Estab. %   Basos 1 Not Estab. %   Neutrophils Absolute 2.8 1.4 - 7.0 x10E3/uL   Lymphocytes Absolute 2.7 0.7 - 3.1 x10E3/uL   Monocytes Absolute 0.5 0.1 - 0.9 x10E3/uL   EOS (ABSOLUTE) 0.1 0.0 - 0.4 x10E3/uL   Basophils Absolute 0.1 0.0 - 0.2 x10E3/uL   Immature Granulocytes 1 Not Estab. %   Immature Grans (Abs) 0.1 0.0 - 0.1 x10E3/uL  PSA  Result Value Ref Range   Prostate Specific Ag, Serum 0.2 0.0 - 4.0 ng/mL  Specimen  status report  Result Value Ref Range   specimen status report Comment       Assessment & Plan:   1. Hypothyroidism, unspecified type - Bayer DCA Hb A1c Waived - TSH - CBC with Differential/Platelet  2. Bipolar disorder, current episode depressed, severe, with psychotic features ([place]) - Bayer DCA Hb A1c Waived - TSH - Lipid panel - CMP14+EGFR - CBC with Differential/Platelet  3. Well adult exam - Bayer DCA Hb A1c Waived - TSH - Lipid panel - CMP14+EGFR - CBC with Differential/Platelet - PSA - Specimen status report  4. Benign prostatic  hyperplasia with urinary hesitancy - PSA - PSA   Continue all other maintenance medications as listed above.  Follow up plan: Return in about 3 months (around 05/17/2020).  Educational handout given for [place] PA-C [place] [address]  [place], [place] [address] [phone]   02/24/2020, 12:36 PM

## 2020-02-16 LAB — LIPID PANEL
Chol/HDL Ratio: 3.2 ratio (ref 0.0–5.0)
Cholesterol, Total: 108 mg/dL (ref 100–199)
HDL: 34 mg/dL — ABNORMAL LOW (ref >39)
LDL Chol Calc (NIH): 55 mg/dL (ref 0–99)
Triglycerides: 97 mg/dL (ref 0–149)
VLDL Cholesterol Cal: 19 mg/dL (ref 5–40)

## 2020-02-16 LAB — CBC WITH DIFFERENTIAL/PLATELET
Basophils Absolute: 0.1 10*3/uL (ref 0.0–0.2)
Basos: 1 %
EOS (ABSOLUTE): 0.1 10*3/uL (ref 0.0–0.4)
Eos: 1 %
Hematocrit: 37.8 % (ref 37.5–51.0)
Hemoglobin: 12.8 g/dL — ABNORMAL LOW (ref 13.0–17.7)
Immature Grans (Abs): 0.1 10*3/uL (ref 0.0–0.1)
Immature Granulocytes: 1 %
Lymphocytes Absolute: 2.7 10*3/uL (ref 0.7–3.1)
Lymphs: 43 %
MCH: 30.1 pg (ref 26.6–33.0)
MCHC: 33.9 g/dL (ref 31.5–35.7)
MCV: 89 fL (ref 79–97)
Monocytes Absolute: 0.5 10*3/uL (ref 0.1–0.9)
Monocytes: 8 %
Neutrophils Absolute: 2.8 10*3/uL (ref 1.4–7.0)
Neutrophils: 46 %
Platelets: 409 10*3/uL (ref 150–450)
RBC: 4.25 x10E6/uL (ref 4.14–5.80)
RDW: 13.4 % (ref 11.6–15.4)
WBC: 6.3 10*3/uL (ref 3.4–10.8)

## 2020-02-16 LAB — CMP14+EGFR
ALT: 20 IU/L (ref 0–44)
AST: 19 IU/L (ref 0–40)
Albumin/Globulin Ratio: 2.2 (ref 1.2–2.2)
Albumin: 3.9 g/dL — ABNORMAL LOW (ref 4.0–5.0)
Alkaline Phosphatase: 61 IU/L (ref 39–117)
BUN/Creatinine Ratio: 14 (ref 9–20)
BUN: 11 mg/dL (ref 6–24)
Bilirubin Total: 0.3 mg/dL (ref 0.0–1.2)
CO2: 25 mmol/L (ref 20–29)
Calcium: 8.7 mg/dL (ref 8.7–10.2)
Chloride: 104 mmol/L (ref 96–106)
Creatinine, Ser: 0.8 mg/dL (ref 0.76–1.27)
GFR calc Af Amer: 120 mL/min/{1.73_m2} (ref >59)
GFR calc non Af Amer: 104 mL/min/{1.73_m2} (ref >59)
Globulin, Total: 1.8 g/dL (ref 1.5–4.5)
Glucose: 158 mg/dL — ABNORMAL HIGH (ref 65–99)
Potassium: 4.2 mmol/L (ref 3.5–5.2)
Sodium: 144 mmol/L (ref 134–144)
Total Protein: 5.7 g/dL — ABNORMAL LOW (ref 6.0–8.5)

## 2020-02-16 LAB — TSH: TSH: 8.02 u[IU]/mL — ABNORMAL HIGH (ref 0.450–4.500)

## 2020-02-17 LAB — SPECIMEN STATUS REPORT

## 2020-02-17 LAB — PSA: Prostate Specific Ag, Serum: 0.2 ng/mL (ref 0.0–4.0)

## 2020-03-02 DIAGNOSIS — F3131 Bipolar disorder, current episode depressed, mild: Secondary | ICD-10-CM | POA: Diagnosis not present

## 2020-03-02 DIAGNOSIS — F410 Panic disorder [episodic paroxysmal anxiety] without agoraphobia: Secondary | ICD-10-CM | POA: Diagnosis not present

## 2020-03-02 DIAGNOSIS — F3174 Bipolar disorder, in full remission, most recent episode manic: Secondary | ICD-10-CM | POA: Diagnosis not present

## 2020-03-04 ENCOUNTER — Ambulatory Visit: Payer: BC Managed Care – PPO | Attending: Internal Medicine

## 2020-03-04 DIAGNOSIS — Z23 Encounter for immunization: Secondary | ICD-10-CM

## 2020-03-04 NOTE — Progress Notes (Signed)
   Covid-19 Vaccination Clinic  Name:  Marvin Espinoza    MRN: [id number] DOB: 25-Jan-1971  03/04/2020  Mr. [name] was observed post Covid-19 immunization for 15 minutes without incident. He was provided with Vaccine Information Sheet and instruction to access the V-Safe system.   [name] was instructed to call 911 with any severe reactions post vaccine: Marland Kitchen Difficulty breathing  . Swelling of face and throat  . A fast heartbeat  . A bad rash all over body  . Dizziness and weakness   Immunizations Administered    Name Date Dose VIS Date Route   Moderna COVID-19 Vaccine 03/04/2020  9:11 AM 0.5 mL 11/16/2019 Intramuscular   Manufacturer: Moderna   Lot: [id number]   [id number]

## 2020-03-06 ENCOUNTER — Telehealth: Payer: Self-pay | Admitting: Physician Assistant

## 2020-03-06 DIAGNOSIS — E039 Hypothyroidism, unspecified: Secondary | ICD-10-CM

## 2020-03-06 NOTE — Telephone Encounter (Signed)
Yes I agree based on his last 2 levels, looks like his TSH had been getting higher, please increase him to 112 mcg and send him a new prescription for 6 months and have him come back and recheck a TSH in 2 months

## 2020-03-06 NOTE — Telephone Encounter (Signed)
Please look at TSH levels from 02/15/20. Pt's psych told him his levels could be causing increased depression and may need meds adjusted. Please advise.

## 2020-03-07 MED ORDER — LEVOTHYROXINE SODIUM 112 MCG PO TABS: 112.0000 ug | ORAL_TABLET | Freq: Every day | ORAL | 1 refills | Status: DC

## 2020-03-07 NOTE — Telephone Encounter (Signed)
Patient aware.

## 2020-03-17 ENCOUNTER — Other Ambulatory Visit: Payer: Self-pay

## 2020-03-17 ENCOUNTER — Ambulatory Visit (INDEPENDENT_AMBULATORY_CARE_PROVIDER_SITE_OTHER): Payer: BC Managed Care – PPO | Admitting: Licensed Clinical Social Worker

## 2020-03-17 DIAGNOSIS — F411 Generalized anxiety disorder: Secondary | ICD-10-CM | POA: Diagnosis not present

## 2020-03-17 DIAGNOSIS — F322 Major depressive disorder, single episode, severe without psychotic features: Secondary | ICD-10-CM

## 2020-03-20 NOTE — Progress Notes (Signed)
Virtual Visit via Telephone Note  I connected with Leodis Rains on 03/20/20 at  8:00 AM EDT by telephone and verified that I am speaking with the correct person using two identifiers.   I discussed the limitations, risks, security and privacy concerns of performing an evaluation and management service by telephone and the availability of in person appointments. I also discussed with the patient that there may be a patient responsible charge related to this service. The patient expressed understanding and agreed to proceed.  I provided 45 minutes of non-face-to-face time during this encounter.   Renee Harder, LCSW    THERAPIST PROGRESS NOTE  Session Time: 8am-8:45am  Participation Level: Active  Behavioral Response: NAAlertIrritable  Type of Therapy: Individual Therapy  Treatment Goals addressed: Anger, Coping, Communication   Interventions: CBT, Motivational Interviewing, Solution Focused and Supportive  Summary: Client presented alert and oriented x5 for today's session. Client reported continued struggle with being easily triggered and frequent anger outbursts. Client reported he has no control over his thoughts, behaviors and stated "I have done counseling, I try skills. I just take my Xanax and chill out". Client showed minimal insight into taking ownership and action into challenging his own perception. Client ruminated on thoughts related to his frustrations in changing primary care physicians and doctors making decisions of his care that don't align with his own thoughts however acknowledged "I'm not a doctor and I know this". Client reported that he has broken things recently out of anger and "I don't have time to do anything about it". Client reported increasing impulsivity and agreed to contact providers who have availability to see him more frequently than this clinician's schedule will allow.  Suicidal/Homicidal: Nowithout intent/plan  Therapist Response: Clinician  joined client for scheduled therapy session. Clinician checked in and assessed for recent stressors, changes in mood, and SI/HI/psychosis. Clinician validated client's feelings throughout session and attempted to utilize CBT to challenge his thoughts related to his ability to utilize healthy coping skills. Clinician re-framed multiple thoughts/statements and suggested client take a step away when feeling angry to allow time to calm down and to not return to a task until he has regulated his emotions. Clinician relayed that my scheduled is currently booked out 5-6 weeks and that it is clinically appropriate and necessary for client to be seen more frequently than that at this time. Clinician provided contact information for two local providers who accept his insurance so that client can be seen as frequently as he needs.  Plan: Client provided contact information for other providers who accept his insurance. Client agreed to follow up with these providers to arrange counseling services.  Diagnosis: Axis I: MDD, GAD      Renee Harder, LCSW 03/20/2020

## 2020-03-21 ENCOUNTER — Encounter: Payer: Self-pay | Admitting: Family Medicine

## 2020-03-21 ENCOUNTER — Other Ambulatory Visit: Payer: Self-pay

## 2020-03-21 ENCOUNTER — Ambulatory Visit (INDEPENDENT_AMBULATORY_CARE_PROVIDER_SITE_OTHER): Payer: BC Managed Care – PPO | Admitting: Family Medicine

## 2020-03-21 VITALS — BP 114/69 | HR 79 | Temp 97.5°F | Ht 70.0 in | Wt 243.0 lb

## 2020-03-21 DIAGNOSIS — E039 Hypothyroidism, unspecified: Secondary | ICD-10-CM

## 2020-03-21 DIAGNOSIS — F39 Unspecified mood [affective] disorder: Secondary | ICD-10-CM | POA: Diagnosis not present

## 2020-03-21 DIAGNOSIS — Z7689 Persons encountering health services in other specified circumstances: Secondary | ICD-10-CM | POA: Diagnosis not present

## 2020-03-21 DIAGNOSIS — R739 Hyperglycemia, unspecified: Secondary | ICD-10-CM

## 2020-03-21 DIAGNOSIS — Z9889 Other specified postprocedural states: Secondary | ICD-10-CM

## 2020-03-21 LAB — CBC
Hematocrit: 40 % (ref 37.5–51.0)
Hemoglobin: 12.9 g/dL — ABNORMAL LOW (ref 13.0–17.7)
MCH: 29.8 pg (ref 26.6–33.0)
MCHC: 32.3 g/dL (ref 31.5–35.7)
MCV: 92 fL (ref 79–97)
Platelets: 229 10*3/uL (ref 150–450)
RBC: 4.33 x10E6/uL (ref 4.14–5.80)
RDW: 13 % (ref 11.6–15.4)
WBC: 5.4 10*3/uL (ref 3.4–10.8)

## 2020-03-21 LAB — BAYER DCA HB A1C WAIVED: HB A1C (BAYER DCA - WAIVED): 6.8 % (ref <7.0)

## 2020-03-21 NOTE — Progress Notes (Signed)
Subjective: CC: Establish care, hypothyroidism PCP: Patient, No Pcp Per [id number] Marvin Espinoza is a 49 y.o. male presenting to clinic today for:  1.  Hypothyroidism History: Onset prior to his gastric bypass surgery.  No history of radiation or surgery to the neck.  No family history of thyroid disorder.  Patient noted to have a TSH of 8 at his last visit in March.  Synthroid was increased to 112 mcg daily.  He is here for 4-week follow-up. He notes ongoing depression and anxiety.  He reports some weight gain and increased appetite.  2.  Mood disorder Onset after his gastric bypass surgery.  He notes having been hospitalized in 7 different mental hospitals, the most recent 2 months ago when he attempted suicide via overdose with Xanax.  He identifies his symptoms primarily being excessive worrying, mood swings and anger outbursts.  He is followed very closely with Dr. Toy Care.  He notes having failed several medications due to intolerance and side effects.  He is currently on fluoxetine, Depakote and alprazolam.  The Depakote does seem to help with mood per his wife.  However he has not found much change with the fluoxetine.  Additionally, he goes on to say that he does not take the alprazolam 6 times a day, albeit prescribed to 6 times daily.  He does not want to become hooked on any medications as there is a strong family history of drug use and his sister.  He was recently started on Adderall for attention deficit disorder.  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  However, he notes that thus far has been overstimulating and he has not been able to tolerate it well.   ROS: Per HPI  Allergies  Allergen Reactions  . Aripiprazole Anaphylaxis and Swelling    "slurred speech"   . Cariprazine Hcl Other (See Comments)  . Latuda  [Lurasidone Hcl] Other (See Comments)  . Seroquel [Quetiapine Fumarate]     tardive dyskinesia  . Alcohol  Itching    Drinking alcohol  . Cariprazine Other (See Comments)    Tardive dyskenesia  . Hydrocodone-Acetaminophen Itching  . Lurasidone Other (See Comments)    dysconesia  . Victoza [Liraglutide] Other (See Comments)    Severe heartburn   . Victoza [Liraglutide]     Severe heartburn  . Bupropion Rash  . Risperidone Rash   Past Medical History:  Diagnosis Date  . Anxiety   . Bipolar disorder ([place])   . CTS (carpal tunnel syndrome)   . Depression   . GERD (gastroesophageal reflux disease)   . H/O bariatric surgery   . Head injury 05/06/2017  . Hypothyroidism   . Hypothyroidism   . Sleep apnea    no need for CPAP at this time  . Sleep apnea     Current Outpatient Medications:  .  albuterol (VENTOLIN HFA) 108 (90 Base) MCG/ACT inhaler, Inhale 2 puffs into the lungs every 6 (six) hours as needed for wheezing or shortness of breath., Disp: 18 g, Rfl: 0 .  ALPRAZolam (XANAX) 1 MG tablet, , Disp: , Rfl:  .  antiseptic oral rinse (BIOTENE) LIQD, 15 mLs by Mouth Rinse route as needed for dry mouth., Disp:  , Rfl:  .  divalproex (DEPAKOTE) 250 MG DR tablet, Take 1 tablet (250 mg total) by mouth 3 (three) times daily. For mood stabilization (Patient taking differently: Take 500 mg by mouth 2 (  two) times daily. For mood stabilization), Disp: 90 tablet, Rfl: 0 .  FLUoxetine (PROZAC) 20 MG capsule, Take 20 mg by mouth daily., Disp: , Rfl:  .  gabapentin (NEURONTIN) 300 MG capsule, TAKE 1 TO 2 CAPSULES AT BEDTIME, Disp: 60 capsule, Rfl: 0 .  hydrOXYzine (ATARAX/VISTARIL) 25 MG tablet, Take 1 tablet (25 mg total) by mouth 3 (three) times daily as needed for anxiety., Disp: 75 tablet, Rfl: 0 .  levothyroxine (SYNTHROID) 112 MCG tablet, Take 1 tablet (112 mcg total) by mouth daily., Disp: 90 tablet, Rfl: 1 .  Multiple Vitamin (MULTIVITAMIN WITH MINERALS) TABS tablet, Take 1 tablet by mouth 2 (two) times daily. For Vitamin supplementation, Disp: , Rfl:  .  OLANZapine (ZYPREXA) 2.5 MG tablet, Take  1 tablet (2.5 mg total) by mouth at bedtime. For mood control, Disp: 30 tablet, Rfl: 0 .  omeprazole (PRILOSEC) 40 MG capsule, Take 1 capsule (40 mg total) by mouth 2 (two) times daily. TAKE ONE (1) CAPSULE EACH DAY (Patient taking differently: Take 40 mg by mouth 2 (two) times daily. ), Disp: 60 capsule, Rfl: 11 .  traZODone (DESYREL) 50 MG tablet, Take 1 tablet (50 mg total) by mouth at bedtime as needed for sleep., Disp: 30 tablet, Rfl: 0 .  vitamin B-12 (CYANOCOBALAMIN) 250 MCG tablet, Take 1 tablet (250 mcg total) by mouth daily. For low B-12 supplementation, Disp: 1 tablet, Rfl:  Social History   Socioeconomic History  . Marital status: Married    Spouse name: Not on file  . Number of children: 1  . Years of education: Not on file  . Highest education level: Not on file  Occupational History  . Not on file  Tobacco Use  . Smoking status: Never Smoker  . Smokeless tobacco: Never Used  Substance and Sexual Activity  . Alcohol use: No  . Drug use: No  . Sexual activity: Yes  Other Topics Concern  . Not on file  Social History Narrative   ** Merged History Encounter **       Social Determinants of Health   Financial Resource Strain:   . Difficulty of Paying Living Expenses:   Food Insecurity:   . Worried About Charity fundraiser in the Last Year:   . Arboriculturist in the Last Year:   Transportation Needs:   . Film/video editor (Medical):   Marland Kitchen Lack of Transportation (Non-Medical):   Physical Activity:   . Days of Exercise per Week:   . Minutes of Exercise per Session:   Stress:   . Feeling of Stress :   Social Connections:   . Frequency of Communication with Friends and Family:   . Frequency of Social Gatherings with Friends and Family:   . Attends Religious Services:   . Active Member of Clubs or Organizations:   . Attends Archivist Meetings:   Marland Kitchen Marital Status:   Intimate Partner Violence:   . Fear of Current or Ex-Partner:   . Emotionally  Abused:   Marland Kitchen Physically Abused:   . Sexually Abused:    Family History  Problem Relation Age of Onset  . Diabetes Mother   . Heart disease Mother   . Depression Mother   . Esophageal cancer Mother   . COPD Father   . Heart disease Father   . Kidney disease Father   . Mental illness Father   . Depression Father   . Mental illness Sister   . Depression Sister   .  Esophageal cancer Maternal Uncle   . Colon cancer Neg Hx   . Rectal cancer Neg Hx   . Stomach cancer Neg Hx     Objective: Office vital signs reviewed. BP 114/69   Pulse 79   Temp (!) 97.5 F (36.4 C) (Temporal)   Ht 5\' 10"  (1.778 m)   Wt 243 lb (110.2 kg)   SpO2 96%   BMI 34.87 kg/m   Physical Examination:  General: Awake, alert, well nourished, No acute distress HEENT: Normal; sclera white.  No exophthalmos.  No thyroid nodules or goiter Cardio: regular rate and rhythm, S1S2 heard, no murmurs appreciated Pulm: clear to auscultation bilaterally, no wheezes, rhonchi or rales; normal work of breathing on room air Extremities: warm, well perfused, No edema, cyanosis or clubbing; +2 pulses bilaterally MSK: normal gait and station Skin: dry; intact; no rashes or lesions; normal temperature Neuro: No tremor noted Psych: Anxious appearing.  Interactive.  Speech somewhat pressured but thought content is normal. Depression screen [facility] 2/9 03/21/2020 02/15/2020 01/05/2019  Decreased Interest 3 2 2   Down, Depressed, Hopeless 3 2 2   PHQ - 2 Score 6 4 4   Altered sleeping 2 1 3   Tired, decreased energy 2 1 2   Change in appetite 2 1 1   Feeling bad or failure about yourself  3 1 2   Trouble concentrating 3 1 3   Moving slowly or fidgety/restless 1 1 1   Suicidal thoughts 2 1 1   PHQ-9 Score 21 11 17   Difficult doing work/chores Very difficult Somewhat difficult -  Some recent data might be hidden   GAD 7 : Generalized Anxiety Score 03/21/2020 02/15/2020  Nervous, Anxious, on Edge 3 2  Control/stop worrying 3 3  Worry too much  - different things 3 2  Trouble relaxing 3 1  Restless 2 1  Easily annoyed or irritable 3 1  Afraid - awful might happen 2 1  Total GAD 7 Score 19 11    Assessment/ Plan: 49 y.o. male   1. Hypothyroidism, unspecified type Somewhat symptomatic with weight gain and increased depression despite increased dose of 112 mcg - Thyroid Panel With TSH  2. Establishing care with new doctor, encounter for  3. Mood disorder ([place]) I will reach out to his psychiatrist per his request as well as his wife per his request  4. Elevated serum glucose Requested A1c check as he was previously diabetic that was insulin-dependent prior to his gastric bypass surgery. - Bayer DCA Hb A1c Waived  5. History of gastric surgery I reviewed labs obtained last year which showed a normal vitamin D level and above normal vitamin B12.  He certainly seems like he is getting sufficient stores through his supplements and diet.  Last hemoglobin was 12.8.  This is down from his check in February.  We will continue to keep a close eye on this. - recheck CBC   Orders Placed This Encounter  Procedures  . Bayer DCA Hb A1c Waived  . CBC   No orders of the defined types were placed in this encounter.    Janora Norlander, DO [place] [phone]

## 2020-03-22 LAB — THYROID PANEL WITH TSH
Free Thyroxine Index: 2.3 (ref 1.2–4.9)
T3 Uptake Ratio: 30 % (ref 24–39)
T4, Total: 7.6 ug/dL (ref 4.5–12.0)
TSH: 5.7 u[IU]/mL — ABNORMAL HIGH (ref 0.450–4.500)

## 2020-03-23 ENCOUNTER — Telehealth: Payer: Self-pay | Admitting: Family Medicine

## 2020-03-23 NOTE — Telephone Encounter (Signed)
Talked to nurse °

## 2020-03-24 ENCOUNTER — Other Ambulatory Visit: Payer: Self-pay | Admitting: Family Medicine

## 2020-03-24 DIAGNOSIS — E039 Hypothyroidism, unspecified: Secondary | ICD-10-CM

## 2020-03-24 MED ORDER — LEVOTHYROXINE SODIUM 125 MCG PO TABS: 125.0000 ug | ORAL_TABLET | Freq: Every day | ORAL | 0 refills | Status: DC

## 2020-03-28 ENCOUNTER — Ambulatory Visit: Payer: BC Managed Care – PPO | Admitting: Physician Assistant

## 2020-03-29 ENCOUNTER — Encounter: Payer: Self-pay | Admitting: Family Medicine

## 2020-03-29 ENCOUNTER — Other Ambulatory Visit: Payer: Self-pay | Admitting: Family Medicine

## 2020-03-29 DIAGNOSIS — F410 Panic disorder [episodic paroxysmal anxiety] without agoraphobia: Secondary | ICD-10-CM | POA: Diagnosis not present

## 2020-03-29 DIAGNOSIS — F3174 Bipolar disorder, in full remission, most recent episode manic: Secondary | ICD-10-CM | POA: Diagnosis not present

## 2020-03-29 DIAGNOSIS — F3132 Bipolar disorder, current episode depressed, moderate: Secondary | ICD-10-CM | POA: Diagnosis not present

## 2020-03-29 MED ORDER — LEVOTHYROXINE SODIUM 137 MCG PO TABS: 137.0000 ug | ORAL_TABLET | Freq: Every day | ORAL | 1 refills | Status: DC

## 2020-04-05 ENCOUNTER — Ambulatory Visit: Payer: BC Managed Care – PPO | Attending: Internal Medicine

## 2020-04-05 ENCOUNTER — Other Ambulatory Visit: Payer: Self-pay

## 2020-04-05 ENCOUNTER — Other Ambulatory Visit: Payer: Self-pay | Admitting: *Deleted

## 2020-04-05 DIAGNOSIS — Z23 Encounter for immunization: Secondary | ICD-10-CM

## 2020-04-05 MED ORDER — PROMETHAZINE HCL 12.5 MG PO TABS: 12.5000 mg | ORAL_TABLET | Freq: Three times a day (TID) | ORAL | 1 refills | Status: DC | PRN

## 2020-04-05 NOTE — Telephone Encounter (Signed)
Last office visit 03/21/2020 Last refill 01/26/2020

## 2020-04-05 NOTE — Progress Notes (Signed)
   Covid-19 Vaccination Clinic  Name:  Kerel Zeiders    MRN: [id number] DOB: Sep 23, 1971  04/05/2020  Mr. Fante was observed post Covid-19 immunization for 15 minutes without incident. He was provided with Vaccine Information Sheet and instruction to access the V-Safe system.   Mr. Cota was instructed to call 911 with any severe reactions post vaccine: Marland Kitchen Difficulty breathing  . Swelling of face and throat  . A fast heartbeat  . A bad rash all over body  . Dizziness and weakness   Immunizations Administered    Name Date Dose VIS Date Route   Moderna COVID-19 Vaccine 04/05/2020  8:18 AM 0.5 mL 11/2019 Intramuscular   Manufacturer: Moderna   Lot: [id number]   [id number]

## 2020-04-12 DIAGNOSIS — E669 Obesity, unspecified: Secondary | ICD-10-CM | POA: Diagnosis not present

## 2020-04-12 DIAGNOSIS — R635 Abnormal weight gain: Secondary | ICD-10-CM | POA: Diagnosis not present

## 2020-04-13 ENCOUNTER — Encounter: Payer: Self-pay | Admitting: *Deleted

## 2020-04-13 DIAGNOSIS — F410 Panic disorder [episodic paroxysmal anxiety] without agoraphobia: Secondary | ICD-10-CM | POA: Diagnosis not present

## 2020-04-13 DIAGNOSIS — F3112 Bipolar disorder, current episode manic without psychotic features, moderate: Secondary | ICD-10-CM | POA: Diagnosis not present

## 2020-04-13 DIAGNOSIS — F3132 Bipolar disorder, current episode depressed, moderate: Secondary | ICD-10-CM | POA: Diagnosis not present

## 2020-04-21 ENCOUNTER — Other Ambulatory Visit: Payer: Self-pay | Admitting: Nurse Practitioner

## 2020-04-24 DIAGNOSIS — F3181 Bipolar II disorder: Secondary | ICD-10-CM | POA: Diagnosis not present

## 2020-04-25 ENCOUNTER — Other Ambulatory Visit: Payer: Self-pay | Admitting: Nurse Practitioner

## 2020-04-25 ENCOUNTER — Other Ambulatory Visit: Payer: Self-pay | Admitting: Pulmonary Disease

## 2020-04-25 DIAGNOSIS — R635 Abnormal weight gain: Secondary | ICD-10-CM

## 2020-04-25 DIAGNOSIS — K9289 Other specified diseases of the digestive system: Secondary | ICD-10-CM

## 2020-05-02 ENCOUNTER — Ambulatory Visit
Admission: RE | Admit: 2020-05-02 | Discharge: 2020-05-02 | Disposition: A | Discharge status: Home / Self Care | Payer: BC Managed Care – PPO | Source: Ambulatory Visit | Attending: Nurse Practitioner | Admitting: Nurse Practitioner

## 2020-05-02 ENCOUNTER — Other Ambulatory Visit: Payer: Self-pay | Admitting: Nurse Practitioner

## 2020-05-02 DIAGNOSIS — K9289 Other specified diseases of the digestive system: Secondary | ICD-10-CM

## 2020-05-02 DIAGNOSIS — R635 Abnormal weight gain: Secondary | ICD-10-CM

## 2020-05-02 DIAGNOSIS — K224 Dyskinesia of esophagus: Secondary | ICD-10-CM | POA: Diagnosis not present

## 2020-05-05 DIAGNOSIS — F3132 Bipolar disorder, current episode depressed, moderate: Secondary | ICD-10-CM | POA: Diagnosis not present

## 2020-05-05 DIAGNOSIS — F3111 Bipolar disorder, current episode manic without psychotic features, mild: Secondary | ICD-10-CM | POA: Diagnosis not present

## 2020-05-08 ENCOUNTER — Telehealth (INDEPENDENT_AMBULATORY_CARE_PROVIDER_SITE_OTHER): Payer: BC Managed Care – PPO | Admitting: Psychiatry

## 2020-05-08 ENCOUNTER — Other Ambulatory Visit: Payer: Self-pay

## 2020-05-08 ENCOUNTER — Encounter: Payer: Self-pay | Admitting: Psychiatry

## 2020-05-08 DIAGNOSIS — F411 Generalized anxiety disorder: Secondary | ICD-10-CM

## 2020-05-08 DIAGNOSIS — F322 Major depressive disorder, single episode, severe without psychotic features: Secondary | ICD-10-CM

## 2020-05-08 NOTE — Progress Notes (Signed)
ECT consult: This is an ECT consult for this 49 year old man referred from his primary psychiatrist.  Patient was reached by telephone.  I identified myself and confirmed his identity.  Patient was cooperative with interview clearly was expecting it and was appropriate and forthcoming.  This 49 year old man reports symptoms of severe anxiety, short-term memory loss, depressed mood.  He says he has crying spells frequently.  Feels negative to the point of having suicidal thoughts much of the time feels hopeless.  He denies hallucinations.  Denies homicidal ideation.  He does say that he frequently has anger or rage attacks during which she will feel violent although he will not act out against other people.  Patient is currently seeing his outpatient psychiatrist regularly and also has a therapist.  His current medication includes lamotrigine and Xanax.  Patient has failed to respond to multiple medicines.  He has had several suicide attempts including a suicide attempt by overdose in February.  Patient has been suffering with mood problems for many years.  He reports there was a dramatic change when he had gastric bypass surgery several years ago.  Since that time he feels that his mood is been completely out of control.  He has had at least 7 hospitalizations with several suicide attempts.  He reports having been on multiple medicines including a wide array of mood stabilizers antidepressant medicines antipsychotics.  Virtually all of them have caused either rashes or some other severe reaction such as elevated blood sugar or dystonic reactions.  None of them seem to have been of any clear benefit.  The only medicine that he feels strongly positive about his Xanax which he has been taking at high doses for years.  Patient has been diagnosed with bipolar 2 and has frequent episodes of agitated elevated energy but still with dysphoric mood.  Patient denies alcohol abuse denies any other drug abuse.  Admits to  having overused Xanax primarily when he was having suicide attempts.  Social history: He is married lives with his wife.  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  Medical history: Severe recurrent gastric esophageal reflux disease.  Hypothyroidism.  No history of heart attack or stroke.  No history of surgery to the head.  He does have a past history of gastric bypass surgery for obesity  Mental status exam: Patient was appropriate and alert and oriented.  Affect anxious and dysphoric.  Thoughts appeared to be lucid although he frequently had to ask his wife for confirmation about information.  He seems very unsure of himself.  He has suicidal thoughts without any current intent or plan.  Appears to have good insight.  Denies hallucinations.  Patient was able to understand the nature of the treatment and ask appropriate questions and appeared to be able to give appropriate consent.  Total time spent on the telephone was 50 minutes.  Patient is a reasonable candidate for electroconvulsive therapy.  I spent time describing the therapy in detail.  Patient had multiple questions.  I advised him that his chance of improvement was perhaps 50% and we reviewed negative risks including short-term memory loss.  Patient states agreement to treatment.  Orders have been sent to preadmission testing.  I have contacted the nursing staff with ECT.  We will go ahead and start working on a plan to possibly start by next Wednesday.  No need to change medicine at this point despite his being on 2 anticonvulsant medicines.  The risk for decreasing them appears to be too high.

## 2020-05-10 ENCOUNTER — Other Ambulatory Visit: Payer: Self-pay

## 2020-05-10 ENCOUNTER — Ambulatory Visit (INDEPENDENT_AMBULATORY_CARE_PROVIDER_SITE_OTHER): Payer: BC Managed Care – PPO | Admitting: Family Medicine

## 2020-05-10 ENCOUNTER — Encounter: Payer: Self-pay | Admitting: Family Medicine

## 2020-05-10 VITALS — BP 120/71 | HR 68 | Temp 97.9°F | Ht 70.0 in | Wt 242.8 lb

## 2020-05-10 DIAGNOSIS — E039 Hypothyroidism, unspecified: Secondary | ICD-10-CM | POA: Diagnosis not present

## 2020-05-10 DIAGNOSIS — Z23 Encounter for immunization: Secondary | ICD-10-CM | POA: Diagnosis not present

## 2020-05-10 DIAGNOSIS — F322 Major depressive disorder, single episode, severe without psychotic features: Secondary | ICD-10-CM

## 2020-05-10 DIAGNOSIS — R6889 Other general symptoms and signs: Secondary | ICD-10-CM | POA: Diagnosis not present

## 2020-05-10 DIAGNOSIS — D649 Anemia, unspecified: Secondary | ICD-10-CM

## 2020-05-10 LAB — HEMOGLOBIN, FINGERSTICK: Hemoglobin: 12.5 g/dL — ABNORMAL LOW (ref 12.6–17.7)

## 2020-05-10 NOTE — Progress Notes (Signed)
Subjective: CC: Establish care, hypothyroidism PCP: Janora Norlander, DO [id number] Marvin Espinoza is a 49 y.o. male presenting to clinic today for:  1.  Hypothyroidism History: Onset prior to his gastric bypass surgery.  No history of radiation or surgery to the neck.  No family history of thyroid disorder.  TSH was noted to be persistently elevated at last visit.  He reports compliance with increased dose of levothyroxine to 137 mcg daily.  He denies any adverse symptoms including difficulty swallowing, change in voice, tremor or heart palpitations.  No unplanned change in weight.  If anything he is lost a little bit of weight since discontinuing Depakote.  2.  Mood disorder Medication was adjusted again recently.  He is currently off of everything except for Lamictal and Xanax.  He is currently titrating the Lamictal up with current dose of 150 mg and anticipation of getting up to 200 mg daily.  He just saw his specialist on Friday and will be following up in the next month.  Additionally, he notes that he will be going in for ECT therapy soon.  He unfortunately had many adverse side effects to many mental health medications.  ROS: Per HPI  Allergies  Allergen Reactions  . Aripiprazole Anaphylaxis and Swelling    "slurred speech"   . Cariprazine Hcl Other (See Comments)  . Latuda  [Lurasidone Hcl] Other (See Comments)  . Seroquel [Quetiapine Fumarate]     tardive dyskinesia  . Alcohol Itching    Drinking alcohol  . Cariprazine Other (See Comments)    Tardive dyskenesia  . Hydrocodone-Acetaminophen Itching  . Lurasidone Other (See Comments)    dysconesia  . Victoza [Liraglutide] Other (See Comments)    Severe heartburn   . Victoza [Liraglutide]     Severe heartburn  . Bupropion Rash  . Risperidone Rash   Past Medical History:  Diagnosis Date  . Anxiety   . Bipolar disorder ([place])   . CTS (carpal tunnel syndrome)   . Depression   . GERD (gastroesophageal reflux disease)    . H/O bariatric surgery   . Head injury 05/06/2017  . Hypothyroidism   . Hypothyroidism   . Sleep apnea    no need for CPAP at this time  . Sleep apnea     Current Outpatient Medications:  .  ALPRAZolam (XANAX) 1 MG tablet, 1 mg. Six times per day prn, Disp: , Rfl:  .  antiseptic oral rinse (BIOTENE) LIQD, 15 mLs by Mouth Rinse route as needed for dry mouth., Disp:  , Rfl:  .  divalproex (DEPAKOTE) 250 MG DR tablet, Take 1 tablet (250 mg total) by mouth 3 (three) times daily. For mood stabilization (Patient taking differently: Take 500 mg by mouth 2 (two) times daily. For mood stabilization), Disp: 90 tablet, Rfl: 0 .  FLUoxetine (PROZAC) 20 MG capsule, Take 20 mg by mouth daily., Disp: , Rfl:  .  levothyroxine (SYNTHROID) 125 MCG tablet, Take 1 tablet (125 mcg total) by mouth daily., Disp: 90 tablet, Rfl: 0 .  levothyroxine (SYNTHROID) 137 MCG tablet, Take 1 tablet (137 mcg total) by mouth daily before breakfast., Disp: 90 tablet, Rfl: 1 .  Multiple Vitamin (MULTIVITAMIN WITH MINERALS) TABS tablet, Take 1 tablet by mouth 2 (two) times daily. For Vitamin supplementation, Disp: , Rfl:  .  OLANZapine (ZYPREXA) 2.5 MG tablet, Take 1 tablet (2.5 mg total) by mouth at bedtime. For mood control, Disp: 30 tablet, Rfl: 0 .  omeprazole (PRILOSEC) 40 MG capsule, Take 1  capsule (40 mg total) by mouth 2 (two) times daily. TAKE ONE (1) CAPSULE EACH DAY (Patient taking differently: Take 40 mg by mouth 2 (two) times daily. ), Disp: 60 capsule, Rfl: 11 .  promethazine (PHENERGAN) 12.5 MG tablet, Take 1-2 tablets (12.5-25 mg total) by mouth every 8 (eight) hours as needed for nausea or vomiting (USE SPARINGLY.  Can cause nervous system problems with prolonged use)., Disp: 60 tablet, Rfl: 1 .  traZODone (DESYREL) 50 MG tablet, Take 1 tablet (50 mg total) by mouth at bedtime as needed for sleep. (Patient not taking: Reported on 03/21/2020), Disp: 30 tablet, Rfl: 0 .  vitamin B-12 (CYANOCOBALAMIN) 250 MCG tablet,  Take 1 tablet (250 mcg total) by mouth daily. For low B-12 supplementation, Disp: 1 tablet, Rfl:  Social History   Socioeconomic History  . Marital status: Married    Spouse name: Not on file  . Number of children: 1  . Years of education: Not on file  . Highest education level: Not on file  Occupational History  . Not on file  Tobacco Use  . Smoking status: Never Smoker  . Smokeless tobacco: Never Used  Substance and Sexual Activity  . Alcohol use: No  . Drug use: No  . Sexual activity: Yes  Other Topics Concern  . Not on file  Social History Narrative   ** Merged History Encounter **       Social Determinants of Health   Financial Resource Strain:   . Difficulty of Paying Living Expenses:   Food Insecurity:   . Worried About Charity fundraiser in the Last Year:   . Arboriculturist in the Last Year:   Transportation Needs:   . Film/video editor (Medical):   Marland Kitchen Lack of Transportation (Non-Medical):   Physical Activity:   . Days of Exercise per Week:   . Minutes of Exercise per Session:   Stress:   . Feeling of Stress :   Social Connections:   . Frequency of Communication with Friends and Family:   . Frequency of Social Gatherings with Friends and Family:   . Attends Religious Services:   . Active Member of Clubs or Organizations:   . Attends Archivist Meetings:   Marland Kitchen Marital Status:   Intimate Partner Violence:   . Fear of Current or Ex-Partner:   . Emotionally Abused:   Marland Kitchen Physically Abused:   . Sexually Abused:    Family History  Problem Relation Age of Onset  . Diabetes Mother   . Heart disease Mother   . Depression Mother   . Esophageal cancer Mother   . COPD Father   . Heart disease Father   . Kidney disease Father   . Mental illness Father   . Depression Father   . Mental illness Sister   . Depression Sister   . Esophageal cancer Maternal Uncle   . Colon cancer Neg Hx   . Rectal cancer Neg Hx   . Stomach cancer Neg Hx      Objective: Office vital signs reviewed. BP 120/71   Pulse 68   Temp 97.9 F (36.6 C)   Ht 5\' 10"  (1.778 m)   Wt 242 lb 12.8 oz (110.1 kg)   SpO2 98%   BMI 34.84 kg/m   Physical Examination:  General: Awake, alert, well nourished, well groomed. No acute distress HEENT: Normal; sclera white.  No exophthalmos.  No goiter Cardio: regular rate and rhythm, S1S2 heard, no murmurs appreciated  Pulm: clear to auscultation bilaterally, no wheezes, rhonchi or rales; normal work of breathing on room air Extremities: warm, well perfused, No edema, cyanosis or clubbing; +2 pulses bilaterally Skin: normal temperature Neuro: No tremor noted Psych: Mood somewhat depressed.  Interactive.  Speech normal today. Depression screen [facility] 2/9 05/10/2020 03/21/2020 02/15/2020  Decreased Interest 3 3 2   Down, Depressed, Hopeless 3 3 2   PHQ - 2 Score 6 6 4   Altered sleeping 3 2 1   Tired, decreased energy 2 2 1   Change in appetite 0 2 1  Feeling bad or failure about yourself  3 3 1   Trouble concentrating 3 3 1   Moving slowly or fidgety/restless 3 1 1   Suicidal thoughts 2 2 1   PHQ-9 Score 22 21 11   Difficult doing work/chores Extremely dIfficult Very difficult Somewhat difficult  Some recent data might be hidden   GAD 7 : Generalized Anxiety Score 05/10/2020 03/21/2020 02/15/2020  Nervous, Anxious, on Edge 1 3 2   Control/stop worrying 3 3 3   Worry too much - different things 3 3 2   Trouble relaxing 2 3 1   Restless 1 2 1   Easily annoyed or irritable 3 3 1   Afraid - awful might happen 2 2 1   Total GAD 7 Score 15 19 11   Anxiety Difficulty Extremely difficult - -    Assessment/ Plan: 49 y.o. male   1. Acquired hypothyroidism Asymptomatic - Thyroid Panel With TSH  2. Anemia, unspecified type Uncertain etiology.  Hemoglobin has been stable the last 3 checks but still within anemic range.  Possibly related to vitamin deficiency in the setting of gastric surgery.  He recently had his stomach interrogated and  there is no evidence of leakage or broken stitches. - Hemoglobin, fingerstick - Anemia panel  3. Need for Tdap vaccination Administered during today's visit - Tdap vaccine greater than or equal to 7yo IM  4. Severe major depression (HCC) Recent adjustment of medications.  Plan for ECT therapy.  He will continue to follow-up with his psychiatrist as scheduled.  Verbal contract for safety.  No orders of the defined types were placed in this encounter.  No orders of the defined types were placed in this encounter.    Janora Norlander, DO [place] [phone]

## 2020-05-11 LAB — ANEMIA PANEL
Ferritin: 43 ng/mL (ref 30–400)
Folate, Hemolysate: >620 ng/mL
Folate, RBC: >1586 ng/mL (ref >498)
Hematocrit: 39.1 % (ref 37.5–51.0)
Iron Saturation: 31 % (ref 15–55)
Iron: 101 ug/dL (ref 38–169)
Retic Ct Pct: 1.9 % (ref 0.6–2.6)
Total Iron Binding Capacity: 326 ug/dL (ref 250–450)
UIBC: 225 ug/dL (ref 111–343)
Vitamin B-12: >2000 pg/mL — ABNORMAL HIGH (ref 232–1245)

## 2020-05-11 LAB — THYROID PANEL WITH TSH
Free Thyroxine Index: 2.3 (ref 1.2–4.9)
T3 Uptake Ratio: 31 % (ref 24–39)
T4, Total: 7.5 ug/dL (ref 4.5–12.0)
TSH: 0.983 u[IU]/mL (ref 0.450–4.500)

## 2020-05-15 ENCOUNTER — Other Ambulatory Visit: Payer: Self-pay | Admitting: Psychiatry

## 2020-05-15 DIAGNOSIS — F3181 Bipolar II disorder: Secondary | ICD-10-CM | POA: Diagnosis not present

## 2020-05-16 ENCOUNTER — Encounter
Admission: RE | Admit: 2020-05-16 | Discharge: 2020-05-16 | Disposition: A | Discharge status: Home / Self Care | Payer: BC Managed Care – PPO | Source: Ambulatory Visit | Attending: Psychiatry | Admitting: Psychiatry

## 2020-05-16 ENCOUNTER — Other Ambulatory Visit: Payer: Self-pay | Admitting: Psychiatry

## 2020-05-16 ENCOUNTER — Other Ambulatory Visit: Payer: Self-pay

## 2020-05-16 ENCOUNTER — Ambulatory Visit
Admission: RE | Admit: 2020-05-16 | Discharge: 2020-05-16 | Disposition: A | Discharge status: Home / Self Care | Payer: BC Managed Care – PPO | Source: Ambulatory Visit | Attending: Psychiatry | Admitting: Psychiatry

## 2020-05-16 DIAGNOSIS — Z01818 Encounter for other preprocedural examination: Secondary | ICD-10-CM | POA: Insufficient documentation

## 2020-05-16 DIAGNOSIS — Z20822 Contact with and (suspected) exposure to covid-19: Secondary | ICD-10-CM | POA: Diagnosis not present

## 2020-05-16 DIAGNOSIS — F329 Major depressive disorder, single episode, unspecified: Secondary | ICD-10-CM | POA: Diagnosis not present

## 2020-05-16 DIAGNOSIS — F332 Major depressive disorder, recurrent severe without psychotic features: Secondary | ICD-10-CM

## 2020-05-16 DIAGNOSIS — R200 Anesthesia of skin: Secondary | ICD-10-CM | POA: Diagnosis not present

## 2020-05-16 LAB — COMPREHENSIVE METABOLIC PANEL
ALT: 21 U/L (ref 0–44)
AST: 22 U/L (ref 15–41)
Albumin: 4.1 g/dL (ref 3.5–5.0)
Alkaline Phosphatase: 55 U/L (ref 38–126)
Anion gap: 10 (ref 5–15)
BUN: 12 mg/dL (ref 6–20)
CO2: 24 mmol/L (ref 22–32)
Calcium: 9 mg/dL (ref 8.9–10.3)
Chloride: 105 mmol/L (ref 98–111)
Creatinine, Ser: 0.97 mg/dL (ref 0.61–1.24)
GFR calc Af Amer: >60 mL/min (ref >60)
GFR calc non Af Amer: >60 mL/min (ref >60)
Glucose, Bld: 107 mg/dL — ABNORMAL HIGH (ref 70–99)
Potassium: 3.7 mmol/L (ref 3.5–5.1)
Sodium: 139 mmol/L (ref 135–145)
Total Bilirubin: 0.8 mg/dL (ref 0.3–1.2)
Total Protein: 7.1 g/dL (ref 6.5–8.1)

## 2020-05-16 LAB — CBC
HCT: 36.9 % — ABNORMAL LOW (ref 39.0–52.0)
Hemoglobin: 12.9 g/dL — ABNORMAL LOW (ref 13.0–17.0)
MCH: 29.9 pg (ref 26.0–34.0)
MCHC: 35 g/dL (ref 30.0–36.0)
MCV: 85.6 fL (ref 80.0–100.0)
Platelets: 269 10*3/uL (ref 150–400)
RBC: 4.31 MIL/uL (ref 4.22–5.81)
RDW: 12.6 % (ref 11.5–15.5)
WBC: 8.2 10*3/uL (ref 4.0–10.5)
nRBC: 0 % (ref 0.0–0.2)

## 2020-05-17 ENCOUNTER — Encounter
Admission: RE | Admit: 2020-05-17 | Discharge: 2020-05-17 | Disposition: A | Discharge status: Home / Self Care | Payer: BC Managed Care – PPO | Source: Ambulatory Visit | Attending: Psychiatry | Admitting: Psychiatry

## 2020-05-17 ENCOUNTER — Encounter: Payer: Self-pay | Admitting: Anesthesiology

## 2020-05-17 DIAGNOSIS — F329 Major depressive disorder, single episode, unspecified: Secondary | ICD-10-CM | POA: Insufficient documentation

## 2020-05-17 DIAGNOSIS — Z87891 Personal history of nicotine dependence: Secondary | ICD-10-CM | POA: Insufficient documentation

## 2020-05-17 DIAGNOSIS — Z841 Family history of disorders of kidney and ureter: Secondary | ICD-10-CM | POA: Insufficient documentation

## 2020-05-17 DIAGNOSIS — Z9884 Bariatric surgery status: Secondary | ICD-10-CM | POA: Insufficient documentation

## 2020-05-17 DIAGNOSIS — Z825 Family history of asthma and other chronic lower respiratory diseases: Secondary | ICD-10-CM | POA: Diagnosis not present

## 2020-05-17 DIAGNOSIS — Z888 Allergy status to other drugs, medicaments and biological substances status: Secondary | ICD-10-CM | POA: Insufficient documentation

## 2020-05-17 DIAGNOSIS — Z885 Allergy status to narcotic agent status: Secondary | ICD-10-CM | POA: Diagnosis not present

## 2020-05-17 DIAGNOSIS — Z8249 Family history of ischemic heart disease and other diseases of the circulatory system: Secondary | ICD-10-CM | POA: Insufficient documentation

## 2020-05-17 DIAGNOSIS — E039 Hypothyroidism, unspecified: Secondary | ICD-10-CM | POA: Insufficient documentation

## 2020-05-17 DIAGNOSIS — Z833 Family history of diabetes mellitus: Secondary | ICD-10-CM | POA: Insufficient documentation

## 2020-05-17 DIAGNOSIS — F419 Anxiety disorder, unspecified: Secondary | ICD-10-CM | POA: Insufficient documentation

## 2020-05-17 DIAGNOSIS — Z818 Family history of other mental and behavioral disorders: Secondary | ICD-10-CM | POA: Insufficient documentation

## 2020-05-17 DIAGNOSIS — F332 Major depressive disorder, recurrent severe without psychotic features: Secondary | ICD-10-CM | POA: Diagnosis not present

## 2020-05-17 DIAGNOSIS — K219 Gastro-esophageal reflux disease without esophagitis: Secondary | ICD-10-CM | POA: Insufficient documentation

## 2020-05-17 LAB — SARS CORONAVIRUS 2 (TAT 6-24 HRS): SARS Coronavirus 2: NEGATIVE

## 2020-05-17 MED ORDER — METHOHEXITAL SODIUM 100 MG/10ML IV SOSY
PREFILLED_SYRINGE | INTRAVENOUS | Status: DC | PRN
  Administered 2020-05-17: 100 mg via INTRAVENOUS

## 2020-05-17 MED ORDER — ONDANSETRON HCL 4 MG/2ML IJ SOLN: 4.0000 mg | Freq: Once | INTRAMUSCULAR | Status: DC | PRN

## 2020-05-17 MED ORDER — METHOHEXITAL SODIUM 0.5 G IJ SOLR
INTRAMUSCULAR | Status: AC
  Filled 2020-05-17: qty 500

## 2020-05-17 MED ORDER — SODIUM CHLORIDE 0.9 % IV SOLN: 500.0000 mL | Freq: Once | INTRAVENOUS | Status: DC

## 2020-05-17 MED ORDER — SUCCINYLCHOLINE CHLORIDE 200 MG/10ML IV SOSY
PREFILLED_SYRINGE | INTRAVENOUS | Status: AC
  Filled 2020-05-17: qty 10

## 2020-05-17 MED ORDER — SUCCINYLCHOLINE CHLORIDE 200 MG/10ML IV SOSY
PREFILLED_SYRINGE | INTRAVENOUS | Status: DC | PRN
  Administered 2020-05-17: 150 mg via INTRAVENOUS

## 2020-05-17 NOTE — Transfer of Care (Signed)
Immediate Anesthesia Transfer of Care Note  Patient: Marvin Espinoza  Procedure(s) Performed: ECT TX  Patient Location: PACU  Anesthesia Type:General  Level of Consciousness: sedated  Airway & Oxygen Therapy: Patient Spontanous Breathing and Patient connected to face mask oxygen  Post-op Assessment: Report given to RN and Post -op Vital signs reviewed and stable  Post vital signs: Reviewed and stable  Last Vitals:  Vitals Value Taken Time  BP 129/74 05/17/20 1124  Temp 35.8 C 05/17/20 1124  Pulse 90 05/17/20 1124  Resp 16 05/17/20 1124  SpO2 100 % 05/17/20 1124    Last Pain:  Vitals:   05/17/20 1124  TempSrc:   PainSc: Asleep         Complications: No apparent anesthesia complications

## 2020-05-17 NOTE — Procedures (Signed)
ECT SERVICES Physician's Interval Evaluation & Treatment Note  Patient Identification: Marvin Espinoza MRN:  [id number] Date of Evaluation:  05/17/2020 TX #: 1  MADRS:   MMSE:   P.E. Findings:  Normal physical exam.  Normal vitals appears to be in good health  Psychiatric Interval Note:  Chronic depression and anxiety  Subjective:  Patient is a 49 y.o. male seen for evaluation for Electroconvulsive Therapy. Anxious  Treatment Summary:   [x]   Right Unilateral             []  Bilateral   % Energy : 0.3 ms 50%   Impedance: 1930 ohms  Seizure Energy Index: 5898 V squared  Postictal Suppression Index: 35%  Seizure Concordance Index: 99%  Medications  Pre Shock: Brevital 100 mg succinylcholine 150 mg  Post Shock:    Seizure Duration: 55 seconds EMG 96 seconds EEG   Comments: Treatment tolerated well next treatment Friday.  Lungs:  [x]   Clear to auscultation               []  Other:   Heart:    [x]   Regular rhythm             []  irregular rhythm    [x]   Previous H&P reviewed, patient examined and there are NO CHANGES                 []   Previous H&P reviewed, patient examined and there are changes noted.   Alethia Berthold, MD 6/2/20216:08 PM

## 2020-05-17 NOTE — H&P (Signed)
Marvin Espinoza is an 49 y.o. male.   Chief Complaint: Patient with chronic severe depression and anxiety HPI: History of severe depression  Past Medical History:  Diagnosis Date  . Anxiety   . Bipolar disorder ([place])   . CTS (carpal tunnel syndrome)   . Depression   . GERD (gastroesophageal reflux disease)   . H/O bariatric surgery 2016  . Head injury 05/06/2017  . Hypothyroidism   . Hypothyroidism   . Sleep apnea    no longer with OSA d/t over 100 pd weight loss    Past Surgical History:  Procedure Laterality Date  . ANAL FISSURE REPAIR    . BARIATRIC SURGERY    . CARPAL TUNNEL RELEASE Left 05/09/2015   Procedure: LEFT CARPAL TUNNEL RELEASE;  Surgeon: Daryll Brod, MD;  Location: [facility];  Service: Orthopedics;  Laterality: Left;  . CARPAL TUNNEL RELEASE Left 05-09-2015  . CARPAL TUNNEL RELEASE Right 06/20/2015   Procedure: RIGHT CARPAL TUNNEL RELEASE;  Surgeon: Daryll Brod, MD;  Location: [facility];  Service: Orthopedics;  Laterality: Right;  REGIONAL/FAB  . CHOLECYSTECTOMY    . COLONOSCOPY    . UPPER GASTROINTESTINAL ENDOSCOPY      Family History  Problem Relation Age of Onset  . Diabetes Mother   . Heart disease Mother   . Depression Mother   . Esophageal cancer Mother   . COPD Father   . Heart disease Father   . Kidney disease Father   . Mental illness Father   . Depression Father   . Mental illness Sister   . Depression Sister   . Esophageal cancer Maternal Uncle   . Colon cancer Neg Hx   . Rectal cancer Neg Hx   . Stomach cancer Neg Hx    Social History:  reports that he has never smoked. He has never used smokeless tobacco. He reports that he does not drink alcohol or use drugs.  Allergies:  Allergies  Allergen Reactions  . Abilify [Aripiprazole] Anaphylaxis and Swelling    "slurred speech" FACIAL DROOPING, TONGUE SWELLING, TARDIVE DYSKENESIA   . Seroquel [Quetiapine Fumarate]     tardive dyskinesia  . Alcohol Itching   Drinking alcohol  . Ambien [Zolpidem] Other (See Comments)    NIGHT TERRORS  . Anacin-3 [Acetaminophen] Itching    REGULAR TYLENOL IS OKAY.  NEEDS BENADRYL TO TAKE THIS  . Cariprazine Other (See Comments)    Tardive dyskenesia  . Cariprazine Hcl Itching  . Latuda [Lurasidone Hcl] Other (See Comments)    RUNS SUGAR TOO HIGH  . Lurasidone Other (See Comments)    TARDIVE DYSKINESIA  . Prozac [Fluoxetine] Itching  . Victoza [Liraglutide] Other (See Comments)    Severe heartburn   . Bupropion Rash  . Hydrocodone-Acetaminophen Itching  . Risperidone Rash  . Tegretol [Carbamazepine] Rash    (Not in a hospital admission)   Results for orders placed or performed during the hospital encounter of 05/16/20 (from the past 48 hour(s))  CBC     Status: Abnormal   Collection Time: 05/16/20  1:00 PM  Result Value Ref Range   WBC 8.2 4.0 - 10.5 K/uL   RBC 4.31 4.22 - 5.81 MIL/uL   Hemoglobin 12.9 (L) 13.0 - 17.0 g/dL   HCT 36.9 (L) 39.0 - 52.0 %   MCV 85.6 80.0 - 100.0 fL   MCH 29.9 26.0 - 34.0 pg   MCHC 35.0 30.0 - 36.0 g/dL   RDW 12.6 11.5 - 15.5 %  Platelets 269 150 - 400 K/uL   nRBC 0.0 0.0 - 0.2 %    Comment: Performed at [facility], [address] [place], [place] [address]  Comprehensive metabolic panel     Status: Abnormal   Collection Time: 05/16/20  1:00 PM  Result Value Ref Range   Sodium 139 135 - 145 mmol/L   Potassium 3.7 3.5 - 5.1 mmol/L   Chloride 105 98 - 111 mmol/L   CO2 24 22 - 32 mmol/L   Glucose, Bld 107 (H) 70 - 99 mg/dL    Comment: Glucose reference range applies only to samples taken after fasting for at least 8 hours.   BUN 12 6 - 20 mg/dL   Creatinine, Ser 0.97 0.61 - 1.24 mg/dL   Calcium 9.0 8.9 - 10.3 mg/dL   Total Protein 7.1 6.5 - 8.1 g/dL   Albumin 4.1 3.5 - 5.0 g/dL   AST 22 15 - 41 U/L   ALT 21 0 - 44 U/L   Alkaline Phosphatase 55 38 - 126 U/L   Total Bilirubin 0.8 0.3 - 1.2 mg/dL   GFR calc non Af Amer >60 >60 mL/min   GFR calc  Af Amer >60 >60 mL/min   Anion gap 10 5 - 15    Comment: Performed at [facility], [address] [place], [place] [address]   DG Chest 2 View  Result Date: 05/17/2020 CLINICAL DATA:  Anesthesia, recurrent major depression. EXAM: CHEST - 2 VIEW COMPARISON:  None. FINDINGS: Trachea is midline. Heart size normal. Lungs are clear. No pleural fluid. IMPRESSION: No acute findings. Electronically Signed   By: Lorin Picket M.D.   On: 05/17/2020 08:31    Review of Systems  Constitutional: Negative.   HENT: Negative.   Eyes: Negative.   Respiratory: Negative.   Cardiovascular: Negative.   Gastrointestinal: Negative.   Musculoskeletal: Negative.   Skin: Negative.   Neurological: Negative.   Psychiatric/Behavioral: Positive for dysphoric mood.    Blood pressure 130/86, pulse 77, temperature (!) 97.4 F (36.3 C), temperature source Oral, resp. rate 16, weight 107 kg, SpO2 100 %. Physical Exam  Nursing note and vitals reviewed. Constitutional: He appears well-developed and well-nourished.  HENT:  Head: Normocephalic and atraumatic.  Eyes: Pupils are equal, round, and reactive to light. Conjunctivae are normal.  Cardiovascular: Normal heart sounds.  Respiratory: Effort normal.  GI: Soft.  Musculoskeletal:        General: Normal range of motion.     Cervical back: Normal range of motion.  Neurological: He is alert.  Skin: Skin is warm and dry.  Psychiatric: He exhibits a depressed mood.     Assessment/Plan Beginning index course ECT with right unilateral treatment today.  Alethia Berthold, MD 05/17/2020, 6:08 PM

## 2020-05-17 NOTE — Anesthesia Preprocedure Evaluation (Addendum)
Anesthesia Evaluation  Patient identified by MRN, date of birth, ID band Patient awake    Reviewed: Allergy & Precautions, NPO status , Patient's Chart, lab work & pertinent test results  History of Anesthesia Complications Negative for: history of anesthetic complications  Airway Mallampati: II  TM Distance: >3 FB Neck ROM: Full    Dental no notable dental hx. (+) Teeth Intact   Pulmonary neg pulmonary ROS, neg sleep apnea, neg COPD, Patient abstained from smoking.Not current smoker,  Sleep study did not show any significant OSA   Pulmonary exam normal breath sounds clear to auscultation       Cardiovascular Exercise Tolerance: Good METS(-) hypertension(-) CAD and (-) Past MI negative cardio ROS  (-) dysrhythmias  Rhythm:Regular Rate:Normal - Systolic murmurs    Neuro/Psych PSYCHIATRIC DISORDERS Anxiety Depression Bipolar Disorder negative neurological ROS     GI/Hepatic GERD  Controlled and Medicated,(+)     (-) substance abuse  ,   Endo/Other  neg diabetesHypothyroidism   Renal/GU negative Renal ROS     Musculoskeletal   Abdominal   Peds  Hematology   Anesthesia Other Findings Past Medical History: No date: Anxiety No date: Bipolar disorder ([place]) No date: CTS (carpal tunnel syndrome) No date: Depression No date: GERD (gastroesophageal reflux disease) 2016: H/O bariatric surgery 05/06/2017: Head injury No date: Hypothyroidism No date: Hypothyroidism No date: Sleep apnea     Comment:  no longer with OSA d/t over 100 pd weight loss  Reproductive/Obstetrics                            Anesthesia Physical Anesthesia Plan  ASA: II  Anesthesia Plan: General   Post-op Pain Management:    Induction: Intravenous  PONV Risk Score and Plan: 2 and Ondansetron and TIVA  Airway Management Planned: Mask  Additional Equipment: None  Intra-op Plan:   Post-operative Plan:    Informed Consent: I have reviewed the patients History and Physical, chart, labs and discussed the procedure including the risks, benefits and alternatives for the proposed anesthesia with the patient or authorized representative who has indicated his/her understanding and acceptance.     Dental advisory given  Plan Discussed with: CRNA and Surgeon  Anesthesia Plan Comments: (Discussed risks of anesthesia with patient, including PONV, muscle aches. Rare risks discussed as well, such as cardiorespiratory sequelae, need for airway intervention. Patient understands.)        Anesthesia Quick Evaluation

## 2020-05-17 NOTE — Anesthesia Postprocedure Evaluation (Signed)
Anesthesia Post Note  Patient: Marvin Espinoza  Procedure(s) Performed: ECT TX  Patient location during evaluation: PACU Anesthesia Type: General Level of consciousness: awake and alert Pain management: pain level controlled Vital Signs Assessment: post-procedure vital signs reviewed and stable Respiratory status: spontaneous breathing, nonlabored ventilation, respiratory function stable and patient connected to nasal cannula oxygen Cardiovascular status: blood pressure returned to baseline and stable Postop Assessment: no apparent nausea or vomiting Anesthetic complications: no     Last Vitals:  Vitals:   05/17/20 1147 05/17/20 1158  BP: 130/86 130/86  Pulse: 77 77  Resp: 16 16  Temp:  (!) 36.3 C  SpO2: 100%     Last Pain:  Vitals:   05/17/20 1158  TempSrc: Oral  PainSc: 0-No pain                 Arita Miss

## 2020-05-17 NOTE — Discharge Instructions (Signed)
1)  The drugs that you have been given will stay in your system until tomorrow so for the       next 24 hours you should not:  A. Drive an automobile  B. Make any legal decisions  C. Drink any alcoholic beverages  2)  You may resume your regular meals upon return home.  3)  A responsible adult must take you home.  Someone should stay with you for a few          hours, then be available by phone for the remainder of the treatment day.  4)  You May experience any of the following symptoms:  Headache, Nausea and a dry mouth (due to the medications you were given),  temporary memory loss and some confusion, or sore muscles (a warm bath  should help this).  If you you experience any of these symptoms let us know on                your return visit.  5)  Report any of the following: any acute discomfort, severe headache, or temperature        greater than 100.5 F.   Also report any unusual redness, swelling, drainage, or pain         at your IV site.    You may report Symptoms to:  [place] at [facility]          Phone: [phone], ECT Department           or Dr. Prescott Gum office [phone]  6)  Your next ECT Treatment is Wednesday June 4 at 8:30   We will call 2 days prior to your scheduled appointment for arrival times.  7)  Nothing to eat or drink after midnight the night before your procedure.  8)  Take      With a sip of water the morning of your procedure.  9)  Other Instructions: Call [phone] to cancel the morning of your procedure due         to illness or emergency.  10) We will call within 72 hours to assess how you are feeling.

## 2020-05-18 ENCOUNTER — Other Ambulatory Visit: Payer: Self-pay | Admitting: Psychiatry

## 2020-05-19 ENCOUNTER — Encounter: Payer: Self-pay | Admitting: Anesthesiology

## 2020-05-19 ENCOUNTER — Telehealth: Payer: Self-pay

## 2020-05-19 ENCOUNTER — Other Ambulatory Visit: Payer: Self-pay | Admitting: Psychiatry

## 2020-05-19 ENCOUNTER — Encounter (HOSPITAL_BASED_OUTPATIENT_CLINIC_OR_DEPARTMENT_OTHER)
Admission: RE | Admit: 2020-05-19 | Discharge: 2020-05-19 | Disposition: A | Discharge status: Home / Self Care | Payer: BC Managed Care – PPO | Source: Ambulatory Visit | Attending: Psychiatry | Admitting: Psychiatry

## 2020-05-19 ENCOUNTER — Other Ambulatory Visit: Payer: Self-pay

## 2020-05-19 DIAGNOSIS — F329 Major depressive disorder, single episode, unspecified: Secondary | ICD-10-CM | POA: Diagnosis not present

## 2020-05-19 DIAGNOSIS — Z888 Allergy status to other drugs, medicaments and biological substances status: Secondary | ICD-10-CM | POA: Diagnosis not present

## 2020-05-19 DIAGNOSIS — Z87891 Personal history of nicotine dependence: Secondary | ICD-10-CM | POA: Diagnosis not present

## 2020-05-19 DIAGNOSIS — F419 Anxiety disorder, unspecified: Secondary | ICD-10-CM | POA: Diagnosis not present

## 2020-05-19 DIAGNOSIS — Z825 Family history of asthma and other chronic lower respiratory diseases: Secondary | ICD-10-CM | POA: Diagnosis not present

## 2020-05-19 DIAGNOSIS — Z885 Allergy status to narcotic agent status: Secondary | ICD-10-CM | POA: Diagnosis not present

## 2020-05-19 DIAGNOSIS — Z833 Family history of diabetes mellitus: Secondary | ICD-10-CM | POA: Diagnosis not present

## 2020-05-19 DIAGNOSIS — Z9884 Bariatric surgery status: Secondary | ICD-10-CM | POA: Diagnosis not present

## 2020-05-19 DIAGNOSIS — Z8249 Family history of ischemic heart disease and other diseases of the circulatory system: Secondary | ICD-10-CM | POA: Diagnosis not present

## 2020-05-19 DIAGNOSIS — Z841 Family history of disorders of kidney and ureter: Secondary | ICD-10-CM | POA: Diagnosis not present

## 2020-05-19 DIAGNOSIS — K219 Gastro-esophageal reflux disease without esophagitis: Secondary | ICD-10-CM | POA: Diagnosis not present

## 2020-05-19 DIAGNOSIS — E039 Hypothyroidism, unspecified: Secondary | ICD-10-CM | POA: Diagnosis not present

## 2020-05-19 DIAGNOSIS — F332 Major depressive disorder, recurrent severe without psychotic features: Secondary | ICD-10-CM | POA: Diagnosis not present

## 2020-05-19 DIAGNOSIS — Z818 Family history of other mental and behavioral disorders: Secondary | ICD-10-CM | POA: Diagnosis not present

## 2020-05-19 MED ORDER — KETOROLAC TROMETHAMINE 30 MG/ML IJ SOLN
INTRAMUSCULAR | Status: AC
  Administered 2020-05-19: 30 mg via INTRAVENOUS
  Filled 2020-05-19: qty 1

## 2020-05-19 MED ORDER — SUCCINYLCHOLINE CHLORIDE 200 MG/10ML IV SOSY
PREFILLED_SYRINGE | INTRAVENOUS | Status: DC | PRN
  Administered 2020-05-19: 150 mg via INTRAVENOUS

## 2020-05-19 MED ORDER — METHOHEXITAL SODIUM 100 MG/10ML IV SOSY
PREFILLED_SYRINGE | INTRAVENOUS | Status: DC | PRN
  Administered 2020-05-19: 100 mg via INTRAVENOUS

## 2020-05-19 MED ORDER — SUCCINYLCHOLINE CHLORIDE 200 MG/10ML IV SOSY
PREFILLED_SYRINGE | INTRAVENOUS | Status: AC
  Filled 2020-05-19: qty 10

## 2020-05-19 MED ORDER — SODIUM CHLORIDE 0.9 % IV SOLN
500.0000 mL | Freq: Once | INTRAVENOUS | Status: AC
  Administered 2020-05-19: 500 mL via INTRAVENOUS

## 2020-05-19 MED ORDER — KETOROLAC TROMETHAMINE 30 MG/ML IJ SOLN: 30.0000 mg | Freq: Once | INTRAMUSCULAR | Status: AC

## 2020-05-19 MED ORDER — METHOHEXITAL SODIUM 0.5 G IJ SOLR
INTRAMUSCULAR | Status: AC
  Filled 2020-05-19: qty 500

## 2020-05-19 MED ORDER — SODIUM CHLORIDE 0.9 % IV SOLN: INTRAVENOUS | Status: DC | PRN

## 2020-05-19 NOTE — Discharge Instructions (Addendum)
1)  The drugs that you have been given will stay in your system until tomorrow so for the       next 24 hours you should not:  A. Drive an automobile  B. Make any legal decisions  C. Drink any alcoholic beverages  2)  You may resume your regular meals upon return home.  3)  A responsible adult must take you home.  Someone should stay with you for a few          hours, then be available by phone for the remainder of the treatment day.  4)  You May experience any of the following symptoms:  Headache, Nausea and a dry mouth (due to the medications you were given),  temporary memory loss and some confusion, or sore muscles (a warm bath  should help this).  If you you experience any of these symptoms let us know on                your return visit.  5)  Report any of the following: any acute discomfort, severe headache, or temperature        greater than 100.5 F.   Also report any unusual redness, swelling, drainage, or pain         at your IV site.    You may report Symptoms to:  [place] at [facility]          Phone: [phone], ECT Department           or Dr. Prescott Gum office [phone]  6)  Your next ECT Treatment is Monday June 7th at 8:15   We will call 2 days prior to your scheduled appointment for arrival times.  7)  Nothing to eat or drink after midnight the night before your procedure.  8)  Take    With a sip of water the morning of your procedure.  9)  Other Instructions: Call [phone] to cancel the morning of your procedure due         to illness or emergency.  10) We will call within 72 hours to assess how you are feeling.

## 2020-05-19 NOTE — Anesthesia Postprocedure Evaluation (Signed)
Anesthesia Post Note  Patient: Athanasios Heldman  Procedure(s) Performed: ECT TX  Patient location during evaluation: PACU Anesthesia Type: General Level of consciousness: awake and alert Pain management: pain level controlled Vital Signs Assessment: post-procedure vital signs reviewed and stable Respiratory status: spontaneous breathing, nonlabored ventilation, respiratory function stable and patient connected to nasal cannula oxygen Cardiovascular status: blood pressure returned to baseline and stable Postop Assessment: no apparent nausea or vomiting Anesthetic complications: no     Last Vitals:  Vitals:   05/19/20 1111 05/19/20 1120  BP: 97/77 124/80  Pulse: 91 90  Resp: 20 16  Temp:  36.7 C  SpO2: 100% 97%    Last Pain:  Vitals:   05/19/20 1120  TempSrc:   PainSc: 0-No pain                 Precious Haws Zyair Rhein

## 2020-05-19 NOTE — Transfer of Care (Signed)
Immediate Anesthesia Transfer of Care Note  Patient: Marvin Espinoza  Procedure(s) Performed: * No procedures listed *  Patient Location: PACU  Anesthesia Type:General  Level of Consciousness: sedated  Airway & Oxygen Therapy: Patient Spontanous Breathing and Patient connected to face mask oxygen  Post-op Assessment: Report given to RN and Post -op Vital signs reviewed and stable  Post vital signs: Reviewed and stable  Last Vitals:  Vitals:   05/19/20 0919 05/19/20 1100  BP: 127/73 127/81  Pulse: 64 73  Resp: 18 14  Temp: (!) 36.3 C 37.1 C  SpO2: 01% 222%    Complications: No apparent anesthesia complications

## 2020-05-19 NOTE — Anesthesia Preprocedure Evaluation (Signed)
Anesthesia Evaluation  Patient identified by MRN, date of birth, ID band Patient awake    Reviewed: Allergy & Precautions, NPO status , Patient's Chart, lab work & pertinent test results  History of Anesthesia Complications Negative for: history of anesthetic complications  Airway Mallampati: II  TM Distance: >3 FB Neck ROM: Full    Dental  (+) Chipped   Pulmonary neg pulmonary ROS, neg sleep apnea, neg COPD, Patient abstained from smoking.Not current smoker,  Sleep study did not show any significant OSA   Pulmonary exam normal breath sounds clear to auscultation       Cardiovascular Exercise Tolerance: Good METS(-) hypertension(-) CAD and (-) Past MI negative cardio ROS  (-) dysrhythmias  Rhythm:Regular Rate:Normal - Systolic murmurs    Neuro/Psych PSYCHIATRIC DISORDERS Anxiety Depression Bipolar Disorder negative neurological ROS     GI/Hepatic GERD  Controlled and Medicated,(+)     (-) substance abuse  ,   Endo/Other  neg diabetesHypothyroidism   Renal/GU negative Renal ROS     Musculoskeletal   Abdominal   Peds  Hematology   Anesthesia Other Findings Past Medical History: No date: Anxiety No date: Bipolar disorder ([place]) No date: CTS (carpal tunnel syndrome) No date: Depression No date: GERD (gastroesophageal reflux disease) 2016: H/O bariatric surgery 05/06/2017: Head injury No date: Hypothyroidism No date: Hypothyroidism No date: Sleep apnea     Comment:  no longer with OSA d/t over 100 pd weight loss  Reproductive/Obstetrics                             Anesthesia Physical  Anesthesia Plan  ASA: II  Anesthesia Plan: General   Post-op Pain Management:    Induction: Intravenous  PONV Risk Score and Plan: 2 and Ondansetron and TIVA  Airway Management Planned: Mask  Additional Equipment: None  Intra-op Plan:   Post-operative Plan:   Informed Consent: I have  reviewed the patients History and Physical, chart, labs and discussed the procedure including the risks, benefits and alternatives for the proposed anesthesia with the patient or authorized representative who has indicated his/her understanding and acceptance.     Dental advisory given  Plan Discussed with: CRNA and Surgeon  Anesthesia Plan Comments: (Patient consented for risks of anesthesia including but not limited to:  - adverse reactions to medications - risk of intubation if required - damage to eyes, teeth, lips or other oral mucosa - nerve damage due to positioning  - sore throat or hoarseness - Damage to heart, brain, nerves, lungs, other parts of body or loss of life  Patient voiced understanding.)        Anesthesia Quick Evaluation

## 2020-05-19 NOTE — Anesthesia Procedure Notes (Signed)
Date/Time: 05/19/2020 10:44 AM Performed by: Doreen Salvage, CRNA Pre-anesthesia Checklist: Patient identified, Emergency Drugs available, Suction available and Patient being monitored Patient Re-evaluated:Patient Re-evaluated prior to induction Oxygen Delivery Method: Circle system utilized Preoxygenation: Pre-oxygenation with 100% oxygen Induction Type: IV induction Ventilation: Mask ventilation without difficulty and Mask ventilation throughout procedure Airway Equipment and Method: Bite block Placement Confirmation: positive ETCO2 Dental Injury: Teeth and Oropharynx as per pre-operative assessment

## 2020-05-19 NOTE — H&P (Signed)
Marvin Espinoza is an 49 y.o. male.   Chief Complaint: Continues with chronic anxiety and depression.  Some muscle aches and pains after last treatment HPI: History of severe chronic depression and anxiety  Past Medical History:  Diagnosis Date  . Anxiety   . Bipolar disorder ([place])   . CTS (carpal tunnel syndrome)   . Depression   . GERD (gastroesophageal reflux disease)   . H/O bariatric surgery 2016  . Head injury 05/06/2017  . Hypothyroidism   . Hypothyroidism   . Sleep apnea    no longer with OSA d/t over 100 pd weight loss    Past Surgical History:  Procedure Laterality Date  . ANAL FISSURE REPAIR    . BARIATRIC SURGERY    . CARPAL TUNNEL RELEASE Left 05/09/2015   Procedure: LEFT CARPAL TUNNEL RELEASE;  Surgeon: Daryll Brod, MD;  Location: [facility];  Service: Orthopedics;  Laterality: Left;  . CARPAL TUNNEL RELEASE Left 05-09-2015  . CARPAL TUNNEL RELEASE Right 06/20/2015   Procedure: RIGHT CARPAL TUNNEL RELEASE;  Surgeon: Daryll Brod, MD;  Location: [facility];  Service: Orthopedics;  Laterality: Right;  REGIONAL/FAB  . CHOLECYSTECTOMY    . COLONOSCOPY    . UPPER GASTROINTESTINAL ENDOSCOPY      Family History  Problem Relation Age of Onset  . Diabetes Mother   . Heart disease Mother   . Depression Mother   . Esophageal cancer Mother   . COPD Father   . Heart disease Father   . Kidney disease Father   . Mental illness Father   . Depression Father   . Mental illness Sister   . Depression Sister   . Esophageal cancer Maternal Uncle   . Colon cancer Neg Hx   . Rectal cancer Neg Hx   . Stomach cancer Neg Hx    Social History:  reports that he has never smoked. He has never used smokeless tobacco. He reports that he does not drink alcohol or use drugs.  Allergies:  Allergies  Allergen Reactions  . Abilify [Aripiprazole] Anaphylaxis and Swelling    "slurred speech" FACIAL DROOPING, TONGUE SWELLING, TARDIVE DYSKENESIA   . Seroquel  [Quetiapine Fumarate]     tardive dyskinesia  . Alcohol Itching    Drinking alcohol  . Ambien [Zolpidem] Other (See Comments)    NIGHT TERRORS  . Anacin-3 [Acetaminophen] Itching    REGULAR TYLENOL IS OKAY.  NEEDS BENADRYL TO TAKE THIS  . Cariprazine Other (See Comments)    Tardive dyskenesia  . Cariprazine Hcl Itching  . Latuda [Lurasidone Hcl] Other (See Comments)    RUNS SUGAR TOO HIGH  . Lurasidone Other (See Comments)    TARDIVE DYSKINESIA  . Prozac [Fluoxetine] Itching  . Victoza [Liraglutide] Other (See Comments)    Severe heartburn   . Bupropion Rash  . Hydrocodone-Acetaminophen Itching  . Risperidone Rash  . Tegretol [Carbamazepine] Rash    (Not in a hospital admission)   No results found for this or any previous visit (from the past 48 hour(s)). No results found.  Review of Systems  Constitutional: Negative.   HENT: Negative.   Eyes: Negative.   Respiratory: Negative.   Cardiovascular: Negative.   Gastrointestinal: Negative.   Musculoskeletal: Negative.   Skin: Negative.   Neurological: Negative.   Psychiatric/Behavioral: Negative.     Blood pressure 124/80, pulse 90, temperature 98 F (36.7 C), resp. rate 16, height 5\' 10"  (1.778 m), weight 107 kg, SpO2 97 %. Physical Exam  Nursing  note and vitals reviewed. Constitutional: He appears well-developed and well-nourished.  HENT:  Head: Normocephalic and atraumatic.  Eyes: Pupils are equal, round, and reactive to light. Conjunctivae are normal.  Cardiovascular: Regular rhythm and normal heart sounds.  Respiratory: Effort normal.  GI: Soft.  Musculoskeletal:        General: Normal range of motion.     Cervical back: Normal range of motion.  Neurological: He is alert.  Skin: Skin is warm and dry.  Psychiatric: He has a normal mood and affect. His behavior is normal. Judgment and thought content normal.     Assessment/Plan Continue ECT with right unilateral treatment today and continue index  course  Alethia Berthold, MD 05/19/2020, 2:19 PM

## 2020-05-19 NOTE — Procedures (Signed)
ECT SERVICES Physician's Interval Evaluation & Treatment Note  Patient Identification: Linville Decarolis MRN:  [id number] Date of Evaluation:  05/19/2020 TX #: 2  MADRS:   MMSE:   P.E. Findings:  No change to physical exam  Psychiatric Interval Note:  Continue with anxiety and depression  Subjective:  Patient is a 49 y.o. male seen for evaluation for Electroconvulsive Therapy. Subjective anxiety and depression some muscle soreness and headache with last treatment  Treatment Summary:   [x]   Right Unilateral             []  Bilateral   % Energy : 0.3 ms 50%   Impedance: 1930 ohms  Seizure Energy Index: 4475 V squared  Postictal Suppression Index: 87%  Seizure Concordance Index: 98%  Medications  Pre Shock: Toradol 30 mg Brevital 100 mg succinylcholine 150 mg  Post Shock:    Seizure Duration: 39 seconds EMG 69 seconds EEG   Comments: Well-tolerated.  Return on Monday.  Lungs:  [x]   Clear to auscultation               []  Other:   Heart:    [x]   Regular rhythm             []  irregular rhythm    [x]   Previous H&P reviewed, patient examined and there are NO CHANGES                 []   Previous H&P reviewed, patient examined and there are changes noted.   Alethia Berthold, MD 6/4/20212:20 PM

## 2020-05-22 ENCOUNTER — Other Ambulatory Visit: Payer: Self-pay

## 2020-05-22 ENCOUNTER — Encounter: Payer: Self-pay | Admitting: Anesthesiology

## 2020-05-22 ENCOUNTER — Encounter (HOSPITAL_BASED_OUTPATIENT_CLINIC_OR_DEPARTMENT_OTHER)
Admission: RE | Admit: 2020-05-22 | Discharge: 2020-05-22 | Disposition: A | Discharge status: Home / Self Care | Payer: BC Managed Care – PPO | Source: Ambulatory Visit | Attending: Psychiatry | Admitting: Psychiatry

## 2020-05-22 ENCOUNTER — Telehealth: Payer: Self-pay

## 2020-05-22 DIAGNOSIS — F419 Anxiety disorder, unspecified: Secondary | ICD-10-CM | POA: Diagnosis not present

## 2020-05-22 DIAGNOSIS — F329 Major depressive disorder, single episode, unspecified: Secondary | ICD-10-CM | POA: Diagnosis not present

## 2020-05-22 DIAGNOSIS — F3181 Bipolar II disorder: Secondary | ICD-10-CM | POA: Diagnosis not present

## 2020-05-22 DIAGNOSIS — Z825 Family history of asthma and other chronic lower respiratory diseases: Secondary | ICD-10-CM | POA: Diagnosis not present

## 2020-05-22 DIAGNOSIS — Z9884 Bariatric surgery status: Secondary | ICD-10-CM | POA: Diagnosis not present

## 2020-05-22 DIAGNOSIS — F332 Major depressive disorder, recurrent severe without psychotic features: Secondary | ICD-10-CM | POA: Diagnosis not present

## 2020-05-22 DIAGNOSIS — Z841 Family history of disorders of kidney and ureter: Secondary | ICD-10-CM | POA: Diagnosis not present

## 2020-05-22 DIAGNOSIS — G4733 Obstructive sleep apnea (adult) (pediatric): Secondary | ICD-10-CM | POA: Diagnosis not present

## 2020-05-22 DIAGNOSIS — K219 Gastro-esophageal reflux disease without esophagitis: Secondary | ICD-10-CM | POA: Diagnosis not present

## 2020-05-22 DIAGNOSIS — Z8249 Family history of ischemic heart disease and other diseases of the circulatory system: Secondary | ICD-10-CM | POA: Diagnosis not present

## 2020-05-22 DIAGNOSIS — Z833 Family history of diabetes mellitus: Secondary | ICD-10-CM | POA: Diagnosis not present

## 2020-05-22 DIAGNOSIS — Z888 Allergy status to other drugs, medicaments and biological substances status: Secondary | ICD-10-CM | POA: Diagnosis not present

## 2020-05-22 DIAGNOSIS — Z818 Family history of other mental and behavioral disorders: Secondary | ICD-10-CM | POA: Diagnosis not present

## 2020-05-22 DIAGNOSIS — Z87891 Personal history of nicotine dependence: Secondary | ICD-10-CM | POA: Diagnosis not present

## 2020-05-22 DIAGNOSIS — E039 Hypothyroidism, unspecified: Secondary | ICD-10-CM | POA: Diagnosis not present

## 2020-05-22 DIAGNOSIS — Z885 Allergy status to narcotic agent status: Secondary | ICD-10-CM | POA: Diagnosis not present

## 2020-05-22 MED ORDER — SODIUM CHLORIDE 0.9 % IV SOLN: INTRAVENOUS | Status: DC | PRN

## 2020-05-22 MED ORDER — ACETAMINOPHEN 325 MG PO TABS
ORAL_TABLET | ORAL | Status: AC
  Filled 2020-05-22: qty 2

## 2020-05-22 MED ORDER — KETOROLAC TROMETHAMINE 30 MG/ML IJ SOLN
INTRAMUSCULAR | Status: AC
  Administered 2020-05-22: 30 mg via INTRAVENOUS
  Filled 2020-05-22: qty 1

## 2020-05-22 MED ORDER — SUCCINYLCHOLINE CHLORIDE 20 MG/ML IJ SOLN
INTRAMUSCULAR | Status: DC | PRN
  Administered 2020-05-22: 150 mg via INTRAVENOUS

## 2020-05-22 MED ORDER — ACETAMINOPHEN 500 MG PO TABS
ORAL_TABLET | ORAL | Status: AC
  Administered 2020-05-22: 650 mg via ORAL
  Filled 2020-05-22: qty 1

## 2020-05-22 MED ORDER — KETOROLAC TROMETHAMINE 30 MG/ML IJ SOLN: 30.0000 mg | Freq: Once | INTRAMUSCULAR | Status: AC

## 2020-05-22 MED ORDER — ACETAMINOPHEN 325 MG PO TABS: 650.0000 mg | ORAL_TABLET | Freq: Once | ORAL | Status: AC

## 2020-05-22 MED ORDER — METHOHEXITAL SODIUM 100 MG/10ML IV SOSY
PREFILLED_SYRINGE | INTRAVENOUS | Status: DC | PRN
  Administered 2020-05-22: 10 mg via INTRAVENOUS
  Administered 2020-05-22: 30 mg via INTRAVENOUS
  Administered 2020-05-22: 100 mg via INTRAVENOUS

## 2020-05-22 MED ORDER — ONDANSETRON HCL 4 MG/2ML IJ SOLN
INTRAMUSCULAR | Status: AC
  Administered 2020-05-22: 4 mg via INTRAVENOUS
  Filled 2020-05-22: qty 2

## 2020-05-22 MED ORDER — ONDANSETRON HCL 4 MG/2ML IJ SOLN: 4.0000 mg | Freq: Once | INTRAMUSCULAR | Status: DC | PRN

## 2020-05-22 MED ORDER — SODIUM CHLORIDE 0.9 % IV SOLN
500.0000 mL | Freq: Once | INTRAVENOUS | Status: AC
  Administered 2020-05-22: 500 mL via INTRAVENOUS

## 2020-05-22 MED ORDER — ONDANSETRON HCL 4 MG/2ML IJ SOLN: 4.0000 mg | Freq: Once | INTRAMUSCULAR | Status: AC

## 2020-05-22 NOTE — Discharge Instructions (Signed)
1)  The drugs that you have been given will stay in your system until tomorrow so for the       next 24 hours you should not:  A. Drive an automobile  B. Make any legal decisions  C. Drink any alcoholic beverages  2)  You may resume your regular meals upon return home.  3)  A responsible adult must take you home.  Someone should stay with you for a few          hours, then be available by phone for the remainder of the treatment day.  4)  You May experience any of the following symptoms:  Headache, Nausea and a dry mouth (due to the medications you were given),  temporary memory loss and some confusion, or sore muscles (a warm bath  should help this).  If you you experience any of these symptoms let us know on                your return visit.  5)  Report any of the following: any acute discomfort, severe headache, or temperature        greater than 100.5 F.   Also report any unusual redness, swelling, drainage, or pain         at your IV site.    You may report Symptoms to:  [place] at [facility]          Phone: [phone], ECT Department           or Dr. Prescott Gum office [phone]  6)  Your next ECT Treatment is Wednesday June 9 at 9:00  We will call 2 days prior to your scheduled appointment for arrival times.  7)  Nothing to eat or drink after midnight the night before your procedure.  8)  Take  With a sip of water the morning of your procedure.  9)  Other Instructions: Call [phone] to cancel the morning of your procedure due         to illness or emergency.  10) We will call within 72 hours to assess how you are feeling.

## 2020-05-22 NOTE — Transfer of Care (Signed)
Immediate Anesthesia Transfer of Care Note  Patient: Marvin Espinoza  Procedure(s) Performed: ECT TX  Patient Location: PACU  Anesthesia Type:General  Level of Consciousness: drowsy and patient cooperative  Airway & Oxygen Therapy: Patient Spontanous Breathing and Patient connected to nasal cannula oxygen  Post-op Assessment: Report given to RN and Post -op Vital signs reviewed and stable  Post vital signs: Reviewed and stable  Last Vitals:  Vitals Value Taken Time  BP 131/83 05/22/20 1103  Temp 36.4 C 05/22/20 1103  Pulse 95 05/22/20 1107  Resp 15 05/22/20 1107  SpO2 96 % 05/22/20 1107  Vitals shown include unvalidated device data.  Last Pain:  Vitals:   05/22/20 1103  TempSrc:   PainSc: Asleep         Complications: No apparent anesthesia complications

## 2020-05-22 NOTE — H&P (Signed)
Marvin Espinoza is an 49 y.o. male.   Chief Complaint: felt worse this weekend . "lonely" but not suicidal HPI: recurrent depression  Past Medical History:  Diagnosis Date  . Anxiety   . Bipolar disorder ([place])   . CTS (carpal tunnel syndrome)   . Depression   . GERD (gastroesophageal reflux disease)   . H/O bariatric surgery 2016  . Head injury 05/06/2017  . Hypothyroidism   . Hypothyroidism   . Sleep apnea    no longer with OSA d/t over 100 pd weight loss    Past Surgical History:  Procedure Laterality Date  . ANAL FISSURE REPAIR    . BARIATRIC SURGERY    . CARPAL TUNNEL RELEASE Left 05/09/2015   Procedure: LEFT CARPAL TUNNEL RELEASE;  Surgeon: Daryll Brod, MD;  Location: [place];  Service: Orthopedics;  Laterality: Left;  . CARPAL TUNNEL RELEASE Left 05-09-2015  . CARPAL TUNNEL RELEASE Right 06/20/2015   Procedure: RIGHT CARPAL TUNNEL RELEASE;  Surgeon: Daryll Brod, MD;  Location: [place];  Service: Orthopedics;  Laterality: Right;  REGIONAL/FAB  . CHOLECYSTECTOMY    . COLONOSCOPY    . UPPER GASTROINTESTINAL ENDOSCOPY      Family History  Problem Relation Age of Onset  . Diabetes Mother   . Heart disease Mother   . Depression Mother   . Esophageal cancer Mother   . COPD Father   . Heart disease Father   . Kidney disease Father   . Mental illness Father   . Depression Father   . Mental illness Sister   . Depression Sister   . Esophageal cancer Maternal Uncle   . Colon cancer Neg Hx   . Rectal cancer Neg Hx   . Stomach cancer Neg Hx    Social History:  reports that he has never smoked. He has never used smokeless tobacco. He reports that he does not drink alcohol or use drugs.  Allergies:  Allergies  Allergen Reactions  . Abilify [Aripiprazole] Anaphylaxis and Swelling    "slurred speech" FACIAL DROOPING, TONGUE SWELLING, TARDIVE DYSKENESIA   . Seroquel [Quetiapine Fumarate]     tardive dyskinesia  . Alcohol Itching   Drinking alcohol  . Ambien [Zolpidem] Other (See Comments)    NIGHT TERRORS  . Anacin-3 [Acetaminophen] Itching    REGULAR TYLENOL IS OKAY.  NEEDS BENADRYL TO TAKE THIS  . Cariprazine Other (See Comments)    Tardive dyskenesia  . Cariprazine Hcl Itching  . Latuda [Lurasidone Hcl] Other (See Comments)    RUNS SUGAR TOO HIGH  . Lurasidone Other (See Comments)    TARDIVE DYSKINESIA  . Prozac [Fluoxetine] Itching  . Victoza [Liraglutide] Other (See Comments)    Severe heartburn   . Bupropion Rash  . Hydrocodone-Acetaminophen Itching  . Risperidone Rash  . Tegretol [Carbamazepine] Rash    (Not in a hospital admission)   No results found for this or any previous visit (from the past 48 hour(s)). No results found.  Review of Systems  Constitutional: Negative.   HENT: Negative.   Eyes: Negative.   Respiratory: Negative.   Cardiovascular: Negative.   Gastrointestinal: Negative.   Musculoskeletal: Negative.   Skin: Negative.   Neurological: Negative.   Psychiatric/Behavioral: Positive for dysphoric mood.    Blood pressure 124/86, pulse 69, temperature 98.4 F (36.9 C), temperature source Oral, resp. rate 18, height 5\' 10"  (1.778 m), weight 107 kg, SpO2 98 %. Physical Exam   Assessment/Plan Patient wishes to continue ect  Jenny Reichmann  Kenasia Scheller, MD 05/22/2020, 10:06 AM

## 2020-05-22 NOTE — Procedures (Signed)
ECT SERVICES Physician's Interval Evaluation & Treatment Note  Patient Identification: Marvin Espinoza MRN:  [id number] Date of Evaluation:  05/22/2020 TX #: 3  MADRS:   MMSE:   P.E. Findings:  No change to physical exam.  Vitals stable.  Psychiatric Interval Note:  Patient reports that he felt "lonely" over the weekend.  This is the best he can put it but he says it was clearly a bad feeling in one that he does not normally have.  He says he has been feeling "weird" since starting the ECT.  Denies any suicidal thoughts.  Denies psychotic symptoms.  We talked about typical side effects such as memory impairment.  Patient and his wife do not feel that is what he is experiencing.  I told him I did not know why the ECT would be making his mood worse necessarily.  Gave him the option of whether he wanted to continue treatment.  Subjective:  Patient is a 49 y.o. male seen for evaluation for Electroconvulsive Therapy. See note above.  Feeling a little bit lonely  Treatment Summary:   [x]   Right Unilateral             []  Bilateral   % Energy : 0.3 ms and 50%   Impedance: 1260 ohms  Seizure Energy Index: 5645 V squared  Postictal Suppression Index: 98%  Seizure Concordance Index: 97%  Medications  Pre Shock: Tylenol 650 mg oral, Toradol 30 mg, Zofran 4 mg, Brevital 130 mg succinylcholine 150 mg  Post Shock:    Seizure Duration: 39 seconds EMG 76 seconds EEG   Comments: Patient decided he would like to continue with treatment.  We will do right unilateral today and then see him back on Wednesday.  He had had some nausea after his last treatment which is the reason for adding the Zofran.  Also his headache last time was not fully resolved with the Toradol which is why we added the oral Tylenol  Lungs:  [x]   Clear to auscultation               []  Other:   Heart:    [x]   Regular rhythm             []  irregular rhythm    [x]   Previous H&P reviewed, patient examined and  there are NO CHANGES                 []   Previous H&P reviewed, patient examined and there are changes noted.   Alethia Berthold, MD 6/7/20212:34 PM

## 2020-05-22 NOTE — Anesthesia Preprocedure Evaluation (Addendum)
Anesthesia Evaluation  Patient identified by MRN, date of birth, ID band Patient awake    Reviewed: Allergy & Precautions, H&P , NPO status , Patient's Chart, lab work & pertinent test results  Airway Mallampati: II  TM Distance: >3 FB Neck ROM: full    Dental  (+) Chipped   Pulmonary sleep apnea ,    breath sounds clear to auscultation       Cardiovascular negative cardio ROS   Rhythm:regular Rate:Normal     Neuro/Psych PSYCHIATRIC DISORDERS Anxiety Depression Bipolar Disorder negative neurological ROS     GI/Hepatic Neg liver ROS, GERD  Controlled,  Endo/Other  Hypothyroidism   Renal/GU negative Renal ROS  negative genitourinary   Musculoskeletal   Abdominal   Peds  Hematology negative hematology ROS (+)   Anesthesia Other Findings Past Medical History: No date: Anxiety No date: Bipolar disorder ([place]) No date: CTS (carpal tunnel syndrome) No date: Depression No date: GERD (gastroesophageal reflux disease) 2016: H/O bariatric surgery 05/06/2017: Head injury No date: Hypothyroidism No date: Hypothyroidism No date: Sleep apnea     Comment:  no longer with OSA d/t over 100 pd weight loss  Past Surgical History: No date: ANAL FISSURE REPAIR No date: BARIATRIC SURGERY 05/09/2015: CARPAL TUNNEL RELEASE; Left     Comment:  Procedure: LEFT CARPAL TUNNEL RELEASE;  Surgeon: Daryll Brod, MD;  Location: [facility];                Service: Orthopedics;  Laterality: Left; 05-09-2015: CARPAL TUNNEL RELEASE; Left 06/20/2015: CARPAL TUNNEL RELEASE; Right     Comment:  Procedure: RIGHT CARPAL TUNNEL RELEASE;  Surgeon: Daryll Brod, MD;  Location: [place];                Service: Orthopedics;  Laterality: Right;  REGIONAL/FAB No date: CHOLECYSTECTOMY No date: COLONOSCOPY No date: UPPER GASTROINTESTINAL ENDOSCOPY  BMI    Body Mass Index: 33.86 kg/m      Reproductive/Obstetrics negative OB ROS                            Anesthesia Physical Anesthesia Plan  ASA: III  Anesthesia Plan: General   Post-op Pain Management:    Induction:   PONV Risk Score and Plan: Ondansetron  Airway Management Planned: Mask  Additional Equipment:   Intra-op Plan:   Post-operative Plan:   Informed Consent: I have reviewed the patients History and Physical, chart, labs and discussed the procedure including the risks, benefits and alternatives for the proposed anesthesia with the patient or authorized representative who has indicated his/her understanding and acceptance.     Dental Advisory Given  Plan Discussed with: Anesthesiologist, CRNA and Surgeon  Anesthesia Plan Comments:         Anesthesia Quick Evaluation

## 2020-05-23 ENCOUNTER — Other Ambulatory Visit: Payer: Self-pay | Admitting: Psychiatry

## 2020-05-23 NOTE — Anesthesia Postprocedure Evaluation (Signed)
Anesthesia Post Note  Patient: Marvin Espinoza  Procedure(s) Performed: ECT TX  Patient location during evaluation: PACU Anesthesia Type: General Level of consciousness: awake and alert Pain management: pain level controlled Vital Signs Assessment: post-procedure vital signs reviewed and stable Respiratory status: spontaneous breathing, nonlabored ventilation and respiratory function stable Cardiovascular status: blood pressure returned to baseline and stable Postop Assessment: no apparent nausea or vomiting Anesthetic complications: no     Last Vitals:  Vitals:   05/22/20 1133 05/22/20 1143  BP: 118/72 120/75  Pulse: 67 65  Resp: 13 18  Temp:  36.9 C  SpO2: 99%     Last Pain:  Vitals:   05/22/20 1143  TempSrc: Oral  PainSc: 0-No pain                 Tera Mater

## 2020-05-24 ENCOUNTER — Other Ambulatory Visit: Payer: Self-pay

## 2020-05-24 ENCOUNTER — Encounter
Admission: RE | Admit: 2020-05-24 | Discharge: 2020-05-24 | Disposition: A | Discharge status: Home / Self Care | Payer: BC Managed Care – PPO | Source: Ambulatory Visit | Attending: Psychiatry | Admitting: Psychiatry

## 2020-05-24 ENCOUNTER — Encounter: Payer: Self-pay | Admitting: Certified Registered Nurse Anesthetist

## 2020-05-24 DIAGNOSIS — Z8249 Family history of ischemic heart disease and other diseases of the circulatory system: Secondary | ICD-10-CM | POA: Diagnosis not present

## 2020-05-24 DIAGNOSIS — E039 Hypothyroidism, unspecified: Secondary | ICD-10-CM | POA: Diagnosis not present

## 2020-05-24 DIAGNOSIS — Z87891 Personal history of nicotine dependence: Secondary | ICD-10-CM | POA: Diagnosis not present

## 2020-05-24 DIAGNOSIS — Z888 Allergy status to other drugs, medicaments and biological substances status: Secondary | ICD-10-CM | POA: Diagnosis not present

## 2020-05-24 DIAGNOSIS — F329 Major depressive disorder, single episode, unspecified: Secondary | ICD-10-CM | POA: Diagnosis not present

## 2020-05-24 DIAGNOSIS — G4730 Sleep apnea, unspecified: Secondary | ICD-10-CM | POA: Diagnosis not present

## 2020-05-24 DIAGNOSIS — F419 Anxiety disorder, unspecified: Secondary | ICD-10-CM | POA: Diagnosis not present

## 2020-05-24 DIAGNOSIS — Z885 Allergy status to narcotic agent status: Secondary | ICD-10-CM | POA: Diagnosis not present

## 2020-05-24 DIAGNOSIS — F418 Other specified anxiety disorders: Secondary | ICD-10-CM | POA: Diagnosis not present

## 2020-05-24 DIAGNOSIS — Z833 Family history of diabetes mellitus: Secondary | ICD-10-CM | POA: Diagnosis not present

## 2020-05-24 DIAGNOSIS — Z9884 Bariatric surgery status: Secondary | ICD-10-CM | POA: Diagnosis not present

## 2020-05-24 DIAGNOSIS — Z825 Family history of asthma and other chronic lower respiratory diseases: Secondary | ICD-10-CM | POA: Diagnosis not present

## 2020-05-24 DIAGNOSIS — Z818 Family history of other mental and behavioral disorders: Secondary | ICD-10-CM | POA: Diagnosis not present

## 2020-05-24 DIAGNOSIS — Z841 Family history of disorders of kidney and ureter: Secondary | ICD-10-CM | POA: Diagnosis not present

## 2020-05-24 DIAGNOSIS — K219 Gastro-esophageal reflux disease without esophagitis: Secondary | ICD-10-CM | POA: Diagnosis not present

## 2020-05-24 MED ORDER — KETOROLAC TROMETHAMINE 30 MG/ML IJ SOLN
INTRAMUSCULAR | Status: AC
  Administered 2020-05-24: 30 mg via INTRAVENOUS
  Filled 2020-05-24: qty 1

## 2020-05-24 MED ORDER — ACETAMINOPHEN 325 MG PO TABS: 650.0000 mg | ORAL_TABLET | ORAL | Status: AC

## 2020-05-24 MED ORDER — ONDANSETRON HCL 4 MG/2ML IJ SOLN: 4.0000 mg | Freq: Once | INTRAMUSCULAR | Status: AC

## 2020-05-24 MED ORDER — KETOROLAC TROMETHAMINE 30 MG/ML IJ SOLN: 30.0000 mg | Freq: Once | INTRAMUSCULAR | Status: AC

## 2020-05-24 MED ORDER — ONDANSETRON HCL 4 MG/2ML IJ SOLN
INTRAMUSCULAR | Status: AC
  Administered 2020-05-24: 4 mg via INTRAVENOUS
  Filled 2020-05-24: qty 2

## 2020-05-24 MED ORDER — SUCCINYLCHOLINE CHLORIDE 200 MG/10ML IV SOSY
PREFILLED_SYRINGE | INTRAVENOUS | Status: AC
  Filled 2020-05-24: qty 10

## 2020-05-24 MED ORDER — ACETAMINOPHEN 325 MG PO TABS
ORAL_TABLET | ORAL | Status: AC
  Administered 2020-05-24: 650 mg via ORAL
  Filled 2020-05-24: qty 2

## 2020-05-24 MED ORDER — SUCCINYLCHOLINE CHLORIDE 20 MG/ML IJ SOLN
INTRAMUSCULAR | Status: DC | PRN
  Administered 2020-05-24: 150 mg via INTRAVENOUS

## 2020-05-24 MED ORDER — SODIUM CHLORIDE 0.9 % IV SOLN
500.0000 mL | Freq: Once | INTRAVENOUS | Status: AC
  Administered 2020-05-24: 500 mL via INTRAVENOUS

## 2020-05-24 MED ORDER — SODIUM CHLORIDE 0.9 % IV SOLN
INTRAVENOUS | Status: DC | PRN
Start: 2020-05-24 — End: 2020-05-24

## 2020-05-24 MED ORDER — METHOHEXITAL SODIUM 100 MG/10ML IV SOSY
PREFILLED_SYRINGE | INTRAVENOUS | Status: DC | PRN
  Administered 2020-05-24: 100 mg via INTRAVENOUS

## 2020-05-24 NOTE — Anesthesia Postprocedure Evaluation (Signed)
Anesthesia Post Note  Patient: Marvin Espinoza  Procedure(s) Performed: ECT TX  Patient location during evaluation: PACU Anesthesia Type: General Level of consciousness: awake and alert Pain management: pain level controlled Vital Signs Assessment: post-procedure vital signs reviewed and stable Respiratory status: spontaneous breathing, nonlabored ventilation, respiratory function stable and patient connected to nasal cannula oxygen Cardiovascular status: blood pressure returned to baseline and stable Postop Assessment: no apparent nausea or vomiting Anesthetic complications: no     Last Vitals:  Vitals:   05/24/20 1150 05/24/20 1158  BP: 118/72 120/70  Pulse: 82 82  Resp: 13 16  Temp:  (!) 36.2 C  SpO2: 96%     Last Pain:  Vitals:   05/24/20 1158  TempSrc: Oral  PainSc:                  Martha Clan

## 2020-05-24 NOTE — Discharge Instructions (Signed)
1)  The drugs that you have been given will stay in your system until tomorrow so for the       next 24 hours you should not:  A. Drive an automobile  B. Make any legal decisions  C. Drink any alcoholic beverages  2)  You may resume your regular meals upon return home.  3)  A responsible adult must take you home.  Someone should stay with you for a few          hours, then be available by phone for the remainder of the treatment day.  4)  You May experience any of the following symptoms:  Headache, Nausea and a dry mouth (due to the medications you were given),  temporary memory loss and some confusion, or sore muscles (a warm bath  should help this).  If you you experience any of these symptoms let us know on                your return visit.  5)  Report any of the following: any acute discomfort, severe headache, or temperature        greater than 100.5 F.   Also report any unusual redness, swelling, drainage, or pain         at your IV site.    You may report Symptoms to:  [place] [facility]          Phone: [phone], ECT Department           or Dr. Prescott Gum office [phone]  6)  Your next ECT Treatment is Friday June 11 at 9:00  We will call 2 days prior to your scheduled appointment for arrival times.  7)  Nothing to eat or drink after midnight the night before your procedure.  8)  Take     With a sip of water the morning of your procedure.  9)  Other Instructions: Call [phone] to cancel the morning of your procedure due         to illness or emergency.  10) We will call within 72 hours to assess how you are feeling.

## 2020-05-24 NOTE — Transfer of Care (Signed)
Immediate Anesthesia Transfer of Care Note  Patient: Marvin Espinoza  Procedure(s) Performed: ECT TX  Patient Location: PACU  Anesthesia Type:General  Level of Consciousness: awake and alert   Airway & Oxygen Therapy: Patient Spontanous Breathing and Patient connected to face mask oxygen  Post-op Assessment: Report given to RN and Post -op Vital signs reviewed and stable  Post vital signs: Reviewed and stable  Last Vitals:  Vitals Value Taken Time  BP 146/96 05/24/20 1127  Temp    Pulse 92 05/24/20 1129  Resp 18 05/24/20 1129  SpO2 100 % 05/24/20 1129  Vitals shown include unvalidated device data.  Last Pain:  Vitals:   05/24/20 0944  TempSrc: Oral  PainSc: 0-No pain         Complications: No apparent anesthesia complications

## 2020-05-24 NOTE — Anesthesia Preprocedure Evaluation (Signed)
Anesthesia Evaluation  Patient identified by MRN, date of birth, ID band Patient awake    Reviewed: Allergy & Precautions, H&P , NPO status , Patient's Chart, lab work & pertinent test results  History of Anesthesia Complications Negative for: history of anesthetic complications  Airway Mallampati: II  TM Distance: >3 FB Neck ROM: full    Dental  (+) Chipped   Pulmonary sleep apnea ,    breath sounds clear to auscultation       Cardiovascular negative cardio ROS   Rhythm:regular Rate:Normal     Neuro/Psych PSYCHIATRIC DISORDERS Anxiety Depression Bipolar Disorder negative neurological ROS     GI/Hepatic Neg liver ROS, GERD  Controlled,  Endo/Other  Hypothyroidism   Renal/GU negative Renal ROS  negative genitourinary   Musculoskeletal   Abdominal   Peds  Hematology negative hematology ROS (+)   Anesthesia Other Findings Past Medical History: No date: Anxiety No date: Bipolar disorder ([place]) No date: CTS (carpal tunnel syndrome) No date: Depression No date: GERD (gastroesophageal reflux disease) 2016: H/O bariatric surgery 05/06/2017: Head injury No date: Hypothyroidism No date: Hypothyroidism No date: Sleep apnea     Comment:  no longer with OSA d/t over 100 pd weight loss  Past Surgical History: No date: ANAL FISSURE REPAIR No date: BARIATRIC SURGERY 05/09/2015: CARPAL TUNNEL RELEASE; Left     Comment:  Procedure: LEFT CARPAL TUNNEL RELEASE;  Surgeon: Daryll Brod, MD;  Location: [place];                Service: Orthopedics;  Laterality: Left; 05-09-2015: CARPAL TUNNEL RELEASE; Left 06/20/2015: CARPAL TUNNEL RELEASE; Right     Comment:  Procedure: RIGHT CARPAL TUNNEL RELEASE;  Surgeon: Daryll Brod, MD;  Location: [place];                Service: Orthopedics;  Laterality: Right;  REGIONAL/FAB No date: CHOLECYSTECTOMY No date: COLONOSCOPY No  date: UPPER GASTROINTESTINAL ENDOSCOPY  BMI    Body Mass Index: 33.86 kg/m      Reproductive/Obstetrics negative OB ROS                             Anesthesia Physical  Anesthesia Plan  ASA: III  Anesthesia Plan: General   Post-op Pain Management:    Induction:   PONV Risk Score and Plan: Ondansetron  Airway Management Planned: Mask  Additional Equipment:   Intra-op Plan:   Post-operative Plan:   Informed Consent: I have reviewed the patients History and Physical, chart, labs and discussed the procedure including the risks, benefits and alternatives for the proposed anesthesia with the patient or authorized representative who has indicated his/her understanding and acceptance.     Dental Advisory Given  Plan Discussed with: Anesthesiologist, CRNA and Surgeon  Anesthesia Plan Comments:         Anesthesia Quick Evaluation

## 2020-05-24 NOTE — Anesthesia Procedure Notes (Signed)
Procedure Name: General with mask airway Date/Time: 05/24/2020 11:16 AM Performed by: Caryl Asp, CRNA Pre-anesthesia Checklist: Patient identified, Emergency Drugs available, Suction available and Patient being monitored Patient Re-evaluated:Patient Re-evaluated prior to induction Oxygen Delivery Method: Circle system utilized Preoxygenation: Pre-oxygenation with 100% oxygen Induction Type: IV induction Ventilation: Mask ventilation without difficulty and Mask ventilation throughout procedure Airway Equipment and Method: Bite block Placement Confirmation: positive ETCO2 Dental Injury: Teeth and Oropharynx as per pre-operative assessment

## 2020-05-24 NOTE — H&P (Signed)
Marvin Espinoza is an 49 y.o. male.   Chief Complaint: Patient continues to feel somewhat anxious.  Unclear whether he is feeling any better. HPI: History of recurrent severe depression  Past Medical History:  Diagnosis Date  . Anxiety   . Bipolar disorder ([place])   . CTS (carpal tunnel syndrome)   . Depression   . GERD (gastroesophageal reflux disease)   . H/O bariatric surgery 2016  . Head injury 05/06/2017  . Hypothyroidism   . Hypothyroidism   . Sleep apnea    no longer with OSA d/t over 100 pd weight loss    Past Surgical History:  Procedure Laterality Date  . ANAL FISSURE REPAIR    . BARIATRIC SURGERY    . CARPAL TUNNEL RELEASE Left 05/09/2015   Procedure: LEFT CARPAL TUNNEL RELEASE;  Surgeon: Daryll Brod, MD;  Location: [facility];  Service: Orthopedics;  Laterality: Left;  . CARPAL TUNNEL RELEASE Left 05-09-2015  . CARPAL TUNNEL RELEASE Right 06/20/2015   Procedure: RIGHT CARPAL TUNNEL RELEASE;  Surgeon: Daryll Brod, MD;  Location: [facility];  Service: Orthopedics;  Laterality: Right;  REGIONAL/FAB  . CHOLECYSTECTOMY    . COLONOSCOPY    . UPPER GASTROINTESTINAL ENDOSCOPY      Family History  Problem Relation Age of Onset  . Diabetes Mother   . Heart disease Mother   . Depression Mother   . Esophageal cancer Mother   . COPD Father   . Heart disease Father   . Kidney disease Father   . Mental illness Father   . Depression Father   . Mental illness Sister   . Depression Sister   . Esophageal cancer Maternal Uncle   . Colon cancer Neg Hx   . Rectal cancer Neg Hx   . Stomach cancer Neg Hx    Social History:  reports that he has never smoked. He has never used smokeless tobacco. He reports that he does not drink alcohol or use drugs.  Allergies:  Allergies  Allergen Reactions  . Abilify [Aripiprazole] Anaphylaxis and Swelling    "slurred speech" FACIAL DROOPING, TONGUE SWELLING, TARDIVE DYSKENESIA   . Seroquel [Quetiapine Fumarate]      tardive dyskinesia  . Alcohol Itching    Drinking alcohol  . Ambien [Zolpidem] Other (See Comments)    NIGHT TERRORS  . Anacin-3 [Acetaminophen] Itching    REGULAR TYLENOL IS OKAY.  NEEDS BENADRYL TO TAKE THIS  . Cariprazine Other (See Comments)    Tardive dyskenesia  . Cariprazine Hcl Itching  . Latuda [Lurasidone Hcl] Other (See Comments)    RUNS SUGAR TOO HIGH  . Lurasidone Other (See Comments)    TARDIVE DYSKINESIA  . Prozac [Fluoxetine] Itching  . Victoza [Liraglutide] Other (See Comments)    Severe heartburn   . Bupropion Rash  . Hydrocodone-Acetaminophen Itching  . Risperidone Rash  . Tegretol [Carbamazepine] Rash    (Not in a hospital admission)   No results found for this or any previous visit (from the past 48 hour(s)). No results found.  Review of Systems  Constitutional: Negative.   HENT: Negative.   Eyes: Negative.   Respiratory: Negative.   Cardiovascular: Negative.   Gastrointestinal: Negative.   Musculoskeletal: Negative.   Skin: Negative.   Neurological: Negative.   Psychiatric/Behavioral: The patient is nervous/anxious.     Blood pressure 118/82, pulse 68, temperature 98.5 F (36.9 C), temperature source Oral, resp. rate 18, height 5\' 10"  (1.778 m), weight 107 kg, SpO2 98 %. Physical  Exam  Nursing note and vitals reviewed. Constitutional: He appears well-developed and well-nourished.  HENT:  Head: Normocephalic and atraumatic.  Eyes: Pupils are equal, round, and reactive to light. Conjunctivae are normal.  Cardiovascular: Normal heart sounds.  Respiratory: Effort normal.  GI: Soft.  Musculoskeletal:        General: Normal range of motion.     Cervical back: Normal range of motion.  Neurological: He is alert.  Skin: Skin is warm and dry.  Psychiatric: Judgment normal. His affect is blunt. His speech is delayed. He is slowed. Cognition and memory are normal. He expresses no homicidal and no suicidal ideation.      Assessment/Plan Continue ECT today is right unilateral treatment #4.  If no improvement on Friday consider changing to bilateral  Alethia Berthold, MD 05/24/2020, 11:10 AM

## 2020-05-25 ENCOUNTER — Other Ambulatory Visit: Payer: Self-pay | Admitting: Psychiatry

## 2020-05-26 ENCOUNTER — Ambulatory Visit: Payer: BC Managed Care – PPO | Admitting: Physician Assistant

## 2020-05-26 ENCOUNTER — Encounter: Payer: Self-pay | Admitting: Anesthesiology

## 2020-05-26 ENCOUNTER — Inpatient Hospital Stay: Admission: RE | Admit: 2020-05-26 | Payer: BC Managed Care – PPO | Source: Ambulatory Visit

## 2020-05-29 DIAGNOSIS — F3174 Bipolar disorder, in full remission, most recent episode manic: Secondary | ICD-10-CM | POA: Diagnosis not present

## 2020-05-29 DIAGNOSIS — F3132 Bipolar disorder, current episode depressed, moderate: Secondary | ICD-10-CM | POA: Diagnosis not present

## 2020-05-29 DIAGNOSIS — F3181 Bipolar II disorder: Secondary | ICD-10-CM | POA: Diagnosis not present

## 2020-05-29 DIAGNOSIS — F410 Panic disorder [episodic paroxysmal anxiety] without agoraphobia: Secondary | ICD-10-CM | POA: Diagnosis not present

## 2020-06-09 ENCOUNTER — Other Ambulatory Visit: Payer: Self-pay | Admitting: Family Medicine

## 2020-06-12 DIAGNOSIS — F3181 Bipolar II disorder: Secondary | ICD-10-CM | POA: Diagnosis not present

## 2020-06-12 NOTE — Telephone Encounter (Signed)
This was just filled in April.  Not sure why he is having so much need for this medication.  He should not be using regularly.  Please have him schedule an OV with his GI doctor if he is having nausea often.

## 2020-06-12 NOTE — Telephone Encounter (Signed)
Last OV 04/2020 Ok to refill

## 2020-06-21 ENCOUNTER — Encounter: Payer: Self-pay | Admitting: Family Medicine

## 2020-06-21 ENCOUNTER — Telehealth: Payer: Self-pay | Admitting: Family Medicine

## 2020-06-21 ENCOUNTER — Other Ambulatory Visit: Payer: Self-pay

## 2020-06-21 ENCOUNTER — Ambulatory Visit (INDEPENDENT_AMBULATORY_CARE_PROVIDER_SITE_OTHER): Payer: BC Managed Care – PPO | Admitting: Family Medicine

## 2020-06-21 ENCOUNTER — Telehealth: Payer: Self-pay | Admitting: Pharmacist

## 2020-06-21 VITALS — BP 131/88 | HR 75 | Temp 97.4°F | Ht 70.0 in | Wt 231.0 lb

## 2020-06-21 DIAGNOSIS — R739 Hyperglycemia, unspecified: Secondary | ICD-10-CM | POA: Diagnosis not present

## 2020-06-21 DIAGNOSIS — R413 Other amnesia: Secondary | ICD-10-CM

## 2020-06-21 DIAGNOSIS — F315 Bipolar disorder, current episode depressed, severe, with psychotic features: Secondary | ICD-10-CM | POA: Diagnosis not present

## 2020-06-21 DIAGNOSIS — E039 Hypothyroidism, unspecified: Secondary | ICD-10-CM | POA: Diagnosis not present

## 2020-06-21 DIAGNOSIS — Z1159 Encounter for screening for other viral diseases: Secondary | ICD-10-CM

## 2020-06-21 LAB — BAYER DCA HB A1C WAIVED: HB A1C (BAYER DCA - WAIVED): 7 % — ABNORMAL HIGH (ref <7.0)

## 2020-06-21 MED ORDER — PANTOPRAZOLE SODIUM 40 MG PO TBEC: 40.0000 mg | DELAYED_RELEASE_TABLET | Freq: Two times a day (BID) | ORAL | 3 refills | Status: DC

## 2020-06-21 MED ORDER — LEVOTHYROXINE SODIUM 137 MCG PO TABS: 137.0000 ug | ORAL_TABLET | Freq: Every day | ORAL | 1 refills | Status: DC

## 2020-06-21 NOTE — Telephone Encounter (Signed)
[employer] glucometer information provided to patient Encouraged patient to pick up strips, meter and lancets at [facility]

## 2020-06-21 NOTE — Telephone Encounter (Signed)
Pt called to make Dr Lajuana Ripple aware that he went to [facility] in [place] to pick up his Rx's and was told by pharmacy that his Protonix Rx needs PA before they can fill.

## 2020-06-21 NOTE — Progress Notes (Addendum)
Subjective: CC: f/u mood, thyroid PCP: Janora Norlander, DO NLG:XQJJHER Baumgardner is a 49 y.o. male presenting to clinic today for:  1. Mood disorder He continues to take Lamictal, Xanax as prescribed by psychiatry.  He underwent 5 rounds of ECT but this was totally ineffective.  In fact he feels that his already impaired memory has worsened.  He would like to see neurology for recheck of his memory.  He understood that this would be a potential side effect of ECT but notes that he is forgetting quite a bit, particularly in short-term.  He describes an instance where he did not even know how to use a key recently.  2.  Elevated blood sugars Patient reports that he has been having elevated blood sugars at home.  He was concerned because sometimes he will have blood sugars into the 200s and he will really not have eaten much that day.  He would like to have his A1c checked.  He has had some increased thirst and increased urinary output.  He also needs a new blood sugar monitor  3.  Hypothyroidism Patient reports unfortunately his prescription was messed up at the pharmacy where he was getting 100 mcg of Synthroid rather than the 137.  He has been back on the 137 mcg a but needs a refill sent to the pharmacy and asked that we get the 100 mcg canceled.  Does not report any change in weight, voice, tremors.  ROS: Per HPI  Allergies  Allergen Reactions  . Abilify [Aripiprazole] Anaphylaxis and Swelling    "slurred speech" FACIAL DROOPING, TONGUE SWELLING, TARDIVE DYSKENESIA   . Seroquel [Quetiapine Fumarate]     tardive dyskinesia  . Alcohol Itching    Drinking alcohol  . Ambien [Zolpidem] Other (See Comments)    NIGHT TERRORS  . Anacin-3 [Acetaminophen] Itching    REGULAR TYLENOL IS OKAY.  NEEDS BENADRYL TO TAKE THIS  . Cariprazine Other (See Comments)    Tardive dyskenesia  . Cariprazine Hcl Itching  . Latuda [Lurasidone Hcl] Other (See Comments)    RUNS SUGAR TOO HIGH  .  Lurasidone Other (See Comments)    TARDIVE DYSKINESIA  . Prozac [Fluoxetine] Itching  . Victoza [Liraglutide] Other (See Comments)    Severe heartburn   . Bupropion Rash  . Hydrocodone-Acetaminophen Itching  . Risperidone Rash  . Tegretol [Carbamazepine] Rash   Past Medical History:  Diagnosis Date  . Anxiety   . Bipolar disorder ([place])   . CTS (carpal tunnel syndrome)   . Depression   . GERD (gastroesophageal reflux disease)   . H/O bariatric surgery 2016  . Head injury 05/06/2017  . Hypothyroidism   . Hypothyroidism   . Sleep apnea    no longer with OSA d/t over 100 pd weight loss    Current Outpatient Medications:  .  ALPRAZolam (XANAX) 1 MG tablet, 1 mg. Six times per day prn, Disp: , Rfl:  .  antiseptic oral rinse (BIOTENE) LIQD, 15 mLs by Mouth Rinse route as needed for dry mouth., Disp:  , Rfl:  .  Asenapine Maleate (SAPHRIS) 2.5 MG SUBL, Place 2.5 mg under the tongue at bedtime. CUTS A 5MG  TAB IN HALF, Disp: , Rfl:  .  Calcium-Vitamin D-Vitamin K (SM CALCIUM SOFT CHEWS PO), Take 1 application by mouth 2 (two) times daily. Chew calcium tabs twice a day.  (bariatric surgery), Disp: , Rfl:  .  Cyanocobalamin (VITAMIN B-12) 5000 MCG TBDP, Take 1 tablet by mouth daily., Disp: ,  Rfl:  .  docusate sodium (EQUATE STOOL SOFTENER) 100 MG capsule, Take 100 mg by mouth 2 (two) times daily., Disp: , Rfl:  .  lamoTRIgine (LAMICTAL) 200 MG tablet, Take 200 mg by mouth at bedtime., Disp: , Rfl:  .  levothyroxine (SYNTHROID) 137 MCG tablet, Take 1 tablet (137 mcg total) by mouth daily before breakfast., Disp: 90 tablet, Rfl: 1 .  Multiple Vitamin (MULTIVITAMIN WITH MINERALS) TABS tablet, Take 1 tablet by mouth 2 (two) times daily. For Vitamin supplementation (Patient taking differently: Take 1 tablet by mouth 2 (two) times daily. For Vitamin supplementation FROM GASTRIC BYPASS), Disp: , Rfl:  .  omeprazole (PRILOSEC) 40 MG capsule, Take 1 capsule (40 mg total) by mouth 2 (two) times daily.  TAKE ONE (1) CAPSULE EACH DAY, Disp: 60 capsule, Rfl: 11 .  promethazine (PHENERGAN) 12.5 MG tablet, Take 1-2 tablets (12.5-25 mg total) by mouth every 8 (eight) hours as needed for nausea or vomiting (USE SPARINGLY.  Can cause nervous system problems with prolonged use)., Disp: 60 tablet, Rfl: 1 .  psyllium (METAMUCIL) 58.6 % powder, Take 1 packet by mouth 2 (two) times daily., Disp: , Rfl:  .  vitamin B-12 (CYANOCOBALAMIN) 250 MCG tablet, Take 1 tablet (250 mcg total) by mouth daily. For low B-12 supplementation, Disp: 1 tablet, Rfl:  Social History   Socioeconomic History  . Marital status: Married    Spouse name: Mickel Baas  . Number of children: 1  . Years of education: Not on file  . Highest education level: Not on file  Occupational History  . Not on file  Tobacco Use  . Smoking status: Never Smoker  . Smokeless tobacco: Never Used  Vaping Use  . Vaping Use: Never used  Substance and Sexual Activity  . Alcohol use: No    Comment: severe allergy to alcohol!!  . Drug use: No  . Sexual activity: Yes  Other Topics Concern  . Not on file  Social History Narrative   Patient lives in mothers' home. He stays alone until nighttime when wife comes up.    He stays there to keep an eye on the house.    His son comes to visit also.          Social Determinants of Health   Financial Resource Strain:   . Difficulty of Paying Living Expenses:   Food Insecurity:   . Worried About Charity fundraiser in the Last Year:   . Arboriculturist in the Last Year:   Transportation Needs:   . Film/video editor (Medical):   Marland Kitchen Lack of Transportation (Non-Medical):   Physical Activity:   . Days of Exercise per Week:   . Minutes of Exercise per Session:   Stress:   . Feeling of Stress :   Social Connections:   . Frequency of Communication with Friends and Family:   . Frequency of Social Gatherings with Friends and Family:   . Attends Religious Services:   . Active Member of Clubs or  Organizations:   . Attends Archivist Meetings:   Marland Kitchen Marital Status:   Intimate Partner Violence:   . Fear of Current or Ex-Partner:   . Emotionally Abused:   Marland Kitchen Physically Abused:   . Sexually Abused:    Family History  Problem Relation Age of Onset  . Diabetes Mother   . Heart disease Mother   . Depression Mother   . Esophageal cancer Mother   . COPD Father   .  Heart disease Father   . Kidney disease Father   . Mental illness Father   . Depression Father   . Mental illness Sister   . Depression Sister   . Esophageal cancer Maternal Uncle   . Colon cancer Neg Hx   . Rectal cancer Neg Hx   . Stomach cancer Neg Hx     Objective: Office vital signs reviewed. There were no vitals taken for this visit.  Physical Examination:  General: Awake, alert, well nourished, well groomed. No acute distress HEENT: Normal; sclera white.  No exophthalmos.  No goiter Cardio: regular rate and rhythm, S1S2 heard, no murmurs appreciated Pulm: clear to auscultation bilaterally, no wheezes, rhonchi or rales; normal work of breathing on room air Extremities: warm, well perfused, No edema, cyanosis or clubbing; +2 pulses bilaterally Skin: normal temperature Neuro: No tremor noted Psych: Mood somewhat depressed.  Interactive.  Speech normal today. Depression screen [facility] 2/9 05/10/2020 03/21/2020 02/15/2020  Decreased Interest 3 3 2   Down, Depressed, Hopeless 3 3 2   PHQ - 2 Score 6 6 4   Altered sleeping 3 2 1   Tired, decreased energy 2 2 1   Change in appetite 0 2 1  Feeling bad or failure about yourself  3 3 1   Trouble concentrating 3 3 1   Moving slowly or fidgety/restless 3 1 1   Suicidal thoughts 2 2 1   PHQ-9 Score 22 21 11   Difficult doing work/chores Extremely dIfficult Very difficult Somewhat difficult  Some recent data might be hidden   GAD 7 : Generalized Anxiety Score 05/10/2020 03/21/2020 02/15/2020  Nervous, Anxious, on Edge 1 3 2   Control/stop worrying 3 3 3   Worry too much -  different things 3 3 2   Trouble relaxing 2 3 1   Restless 1 2 1   Easily annoyed or irritable 3 3 1   Afraid - awful might happen 2 2 1   Total GAD 7 Score 15 19 11   Anxiety Difficulty Extremely difficult - -   MMSE - Mini Mental State Exam 06/21/2020 04/15/2017  Orientation to time 4 5  Orientation to Place 5 5  Registration 3 3  Attention/ Calculation 5 3  Recall 1 3  Language- name 2 objects 2 2  Language- repeat 1 1  Language- follow 3 step command 3 3  Language- read & follow direction 1 1  Write a sentence 1 1  Copy design 1 1  Total score 27 28  Some encounter information is confidential and restricted. Go to Review Flowsheets activity to see all data.    Assessment/ Plan: 49 y.o. male   1. Bipolar disorder, current episode depressed, severe, with psychotic features ([place]) Somewhat uncontrolled but this is managed by psychiatry.  Ongoing check a CBC given use of Lamictal - CBC with Differential  2. Acquired hypothyroidism Asymptomatic.  Check thyroid panel - Thyroid Panel With TSH  3. Elevated serum glucose A1c 7.0.  Borderline.  May need to consider restarting medication. - Bayer DCA Hb A1c Waived - Microalbumin / creatinine urine ratio  4. Memory loss RPR obtained.  Vitamin B12 obtained previously.  Thyroid panel being obtained as above as well as CBC.  Referral to neurology in [place] placed.  He apparently has seen Dr. Tomi Likens previously. - RPR - Ambulatory referral to Neurology  5. Encounter for hepatitis C screening test for low risk patient - Hepatitis C antibody   Orders Placed This Encounter  Procedures  . Bayer DCA Hb A1c Waived  . Thyroid Panel With TSH  .  RPR  . Hepatitis C antibody  . Microalbumin / creatinine urine ratio  . CBC with Differential  . Ambulatory referral to Neurology    Referral Priority:   Routine    Referral Type:   Consultation    Referral Reason:   Specialty Services Required    Requested Specialty:   Neurology    Number of  Visits Requested:   1   Meds ordered this encounter  Medications  . levothyroxine (SYNTHROID) 137 MCG tablet    Sig: Take 1 tablet (137 mcg total) by mouth daily before breakfast. Please DC the 155mcg    Dispense:  90 tablet    Refill:  1  . pantoprazole (PROTONIX) 40 MG tablet    Sig: Take 1 tablet (40 mg total) by mouth 2 (two) times daily.    Dispense:  180 tablet    Refill:  [place], [place] [phone]

## 2020-06-21 NOTE — Patient Instructions (Signed)
Not due for cholesterol until 2021.  You had labs performed today.  You will be contacted with the results of the labs once they are available, usually in the next 3 business days for routine lab work.  If you have an active my chart account, they will be released to your MyChart.  If you prefer to have these labs released to you via telephone, please let us know.  If you had a pap smear or biopsy performed, expect to be contacted in about 7-10 days.

## 2020-06-22 ENCOUNTER — Other Ambulatory Visit: Payer: Self-pay | Admitting: *Deleted

## 2020-06-22 ENCOUNTER — Telehealth: Payer: Self-pay | Admitting: Family Medicine

## 2020-06-22 LAB — CBC WITH DIFFERENTIAL/PLATELET
Basophils Absolute: 0.1 10*3/uL (ref 0.0–0.2)
Basos: 1 %
EOS (ABSOLUTE): 0.1 10*3/uL (ref 0.0–0.4)
Eos: 2 %
Hematocrit: 43.3 % (ref 37.5–51.0)
Hemoglobin: 14.4 g/dL (ref 13.0–17.7)
Immature Grans (Abs): 0.1 10*3/uL (ref 0.0–0.1)
Immature Granulocytes: 1 %
Lymphocytes Absolute: 2.8 10*3/uL (ref 0.7–3.1)
Lymphs: 43 %
MCH: 29.3 pg (ref 26.6–33.0)
MCHC: 33.3 g/dL (ref 31.5–35.7)
MCV: 88 fL (ref 79–97)
Monocytes Absolute: 0.5 10*3/uL (ref 0.1–0.9)
Monocytes: 7 %
Neutrophils Absolute: 3 10*3/uL (ref 1.4–7.0)
Neutrophils: 46 %
Platelets: 254 10*3/uL (ref 150–450)
RBC: 4.91 x10E6/uL (ref 4.14–5.80)
RDW: 12.9 % (ref 11.6–15.4)
WBC: 6.6 10*3/uL (ref 3.4–10.8)

## 2020-06-22 LAB — HEPATITIS C ANTIBODY: Hep C Virus Ab: <0.1 s/co ratio (ref 0.0–0.9)

## 2020-06-22 LAB — THYROID PANEL WITH TSH
Free Thyroxine Index: 2.9 (ref 1.2–4.9)
T3 Uptake Ratio: 32 % (ref 24–39)
T4, Total: 9 ug/dL (ref 4.5–12.0)
TSH: 1.49 u[IU]/mL (ref 0.450–4.500)

## 2020-06-22 LAB — MICROALBUMIN / CREATININE URINE RATIO
Creatinine, Urine: 19.6 mg/dL
Microalb/Creat Ratio: <15 mg/g{creat} (ref 0–29)
Microalbumin, Urine: <3 ug/mL

## 2020-06-22 LAB — RPR: RPR Ser Ql: NONREACTIVE

## 2020-06-22 MED ORDER — METFORMIN HCL 500 MG PO TABS: 500.0000 mg | ORAL_TABLET | Freq: Two times a day (BID) | ORAL | 0 refills | Status: DC

## 2020-06-22 NOTE — Telephone Encounter (Signed)
An allergy warning comes up on new script for metformin.  Please review.

## 2020-06-22 NOTE — Telephone Encounter (Signed)
Back up provider question,  start medicine for elevated blood sugar?

## 2020-06-22 NOTE — Telephone Encounter (Signed)
Sent to plan Key: [id number]# [id number] Pantoprazole Sodium 40 mg tablets

## 2020-06-22 NOTE — Telephone Encounter (Signed)
Okay thank you, sent the Metformin for the patient

## 2020-06-22 NOTE — Progress Notes (Signed)
Send the Metformin for the patient

## 2020-06-22 NOTE — Telephone Encounter (Signed)
Pt states that at his appt yesterday his A1C was 7 and his sugar this morning is 249. He is wanting to know if he needs to start on metformin or medication for his blood sugar. Requesting to speak with the nurse. Would also like to check status on the prior auth for Protonix.

## 2020-06-22 NOTE — Telephone Encounter (Signed)
Patient's kidney function does look fine, I am okay with going ahead and start him on Metformin 500 twice daily, take with food and give him 3 months worth and have him follow up with PCP

## 2020-06-23 ENCOUNTER — Telehealth: Payer: Self-pay | Admitting: Family Medicine

## 2020-06-23 NOTE — Telephone Encounter (Signed)
Patient aware, script is ready. 

## 2020-06-23 NOTE — Telephone Encounter (Signed)
Lab results reviewed with patient.

## 2020-06-26 ENCOUNTER — Telehealth: Payer: Self-pay

## 2020-06-26 DIAGNOSIS — F3181 Bipolar II disorder: Secondary | ICD-10-CM | POA: Diagnosis not present

## 2020-06-26 MED ORDER — GLUCOSE BLOOD VI STRP: ORAL_STRIP | 12 refills | Status: DC

## 2020-06-26 MED ORDER — ONETOUCH DELICA LANCETS 30G MISC: 3 refills | Status: AC

## 2020-06-26 MED ORDER — ONETOUCH VERIO W/DEVICE KIT: PACK | 0 refills | Status: AC

## 2020-06-26 NOTE — Telephone Encounter (Signed)
Refer to the phone note from 06/21/20-  Patient states that his out of pocket is free.  Would like meter sent to the  Drug store.

## 2020-06-26 NOTE — Telephone Encounter (Signed)
Patient aware and verbalized understanding. °

## 2020-06-26 NOTE — Telephone Encounter (Signed)
Pt aware.

## 2020-06-26 NOTE — Telephone Encounter (Addendum)
Pantoprazole 40 mg tabs was DENIED by pt plan    Suggestions include: Lansoprazole and Esomeprazole - these alternatives would need to be OTC and will not be covered by insurance   He will have to try and "fail" these to be considered for coverage of the Pantoprazole.   Attempted to reach pt - NA  on home number LM on cell to call back

## 2020-06-26 NOTE — Telephone Encounter (Signed)
Please let patient know the following: One Touch Verio Meter kit, test strips & Lancets sent to pharmacy for patient to pick up He should call first to make sure it is ready

## 2020-07-03 DIAGNOSIS — F3181 Bipolar II disorder: Secondary | ICD-10-CM | POA: Diagnosis not present

## 2020-07-11 ENCOUNTER — Other Ambulatory Visit: Payer: Self-pay | Admitting: Family Medicine

## 2020-07-11 DIAGNOSIS — F3181 Bipolar II disorder: Secondary | ICD-10-CM | POA: Diagnosis not present

## 2020-07-18 DIAGNOSIS — F3181 Bipolar II disorder: Secondary | ICD-10-CM | POA: Diagnosis not present

## 2020-07-19 DIAGNOSIS — F3112 Bipolar disorder, current episode manic without psychotic features, moderate: Secondary | ICD-10-CM | POA: Diagnosis not present

## 2020-07-19 DIAGNOSIS — F410 Panic disorder [episodic paroxysmal anxiety] without agoraphobia: Secondary | ICD-10-CM | POA: Diagnosis not present

## 2020-07-19 DIAGNOSIS — F3131 Bipolar disorder, current episode depressed, mild: Secondary | ICD-10-CM | POA: Diagnosis not present

## 2020-07-25 DIAGNOSIS — F3181 Bipolar II disorder: Secondary | ICD-10-CM | POA: Diagnosis not present

## 2020-07-31 ENCOUNTER — Ambulatory Visit: Payer: BC Managed Care – PPO | Admitting: Family

## 2020-08-02 ENCOUNTER — Ambulatory Visit (INDEPENDENT_AMBULATORY_CARE_PROVIDER_SITE_OTHER): Payer: BC Managed Care – PPO

## 2020-08-02 ENCOUNTER — Encounter: Payer: Self-pay | Admitting: Family Medicine

## 2020-08-02 ENCOUNTER — Other Ambulatory Visit: Payer: Self-pay

## 2020-08-02 ENCOUNTER — Telehealth: Payer: Self-pay | Admitting: Family Medicine

## 2020-08-02 ENCOUNTER — Ambulatory Visit (INDEPENDENT_AMBULATORY_CARE_PROVIDER_SITE_OTHER): Payer: BC Managed Care – PPO | Admitting: Family Medicine

## 2020-08-02 VITALS — BP 141/94 | HR 81 | Temp 97.8°F | Ht 70.0 in | Wt 231.0 lb

## 2020-08-02 DIAGNOSIS — M546 Pain in thoracic spine: Secondary | ICD-10-CM | POA: Diagnosis not present

## 2020-08-02 DIAGNOSIS — M545 Low back pain, unspecified: Secondary | ICD-10-CM

## 2020-08-02 DIAGNOSIS — G8929 Other chronic pain: Secondary | ICD-10-CM | POA: Diagnosis not present

## 2020-08-02 DIAGNOSIS — R071 Chest pain on breathing: Secondary | ICD-10-CM | POA: Diagnosis not present

## 2020-08-02 DIAGNOSIS — E119 Type 2 diabetes mellitus without complications: Secondary | ICD-10-CM

## 2020-08-02 MED ORDER — BACLOFEN 10 MG PO TABS: 5.0000 mg | ORAL_TABLET | Freq: Three times a day (TID) | ORAL | 0 refills | Status: DC | PRN

## 2020-08-02 MED ORDER — PANTOPRAZOLE SODIUM 40 MG PO TBEC: 40.0000 mg | DELAYED_RELEASE_TABLET | Freq: Two times a day (BID) | ORAL | 3 refills | Status: DC

## 2020-08-02 MED ORDER — METHYLPREDNISOLONE ACETATE 40 MG/ML IJ SUSP
40.0000 mg | Freq: Once | INTRAMUSCULAR | Status: AC
Start: 2020-08-02 — End: 2020-08-02
  Administered 2020-08-02: 40 mg via INTRAMUSCULAR

## 2020-08-02 NOTE — Telephone Encounter (Signed)
PA was started on cover my meds today with a This request has received a Favorable outcome from [facility].  Please keep in mind this is not a guarantee of payment. Eligibility and Benefit determinations will be made at the time of service.  Please note any additional information provided by [facility] at the bottom of the screen.  Called Brain at the drug store they are aware

## 2020-08-02 NOTE — Progress Notes (Addendum)
Subjective: HF:WYOVZCHY BGs PCP: Janora Norlander, DO IFO:YDXAJOI Busch is a 49 y.o. male presenting to clinic today for:  1. Elevated BGs Patient has been seeing elevated blood sugars at home.  He had an A1c in July which was 7.0.  He was restarted on Metformin 500 mg twice daily.  He has been really working on cutting carbs.  He does not want his bariatric surgery to be for nothing as he is had quite a bit of complications since bariatric surgery.  2.  Right sided thoracic back pain/chronic low back pain Acute onset of right-sided thoracic back pain at about the T11-12 level.  He points to the rib angle on the right as the area of pain.  This onset on Saturday.  Pain is only present if he takes a deep breath in or with certain movements.  He denies any preceding injury.  He stepped on some uneven ground and the symptoms started.  He has been taking Tylenol but this has not been helpful.  He is unable to tolerate oral NSAIDs secondary to bariatric surgery.  He has chronic lumbar pain that was previously evaluated with MRI at [facility].  He was not determined to be surgical at that time.  He denies any radicular symptoms but does note some radiation of the low back pain into bilateral buttocks.  Certain positions also make this worse.  Sometimes the pain is constant, sometimes it is intermittent.  Again unable to tolerate most medications for relief due to bariatric surgery.  No falls or sensory changes in the lower extremity.  No saddle anesthesia, fecal incontinence or urinary retention.  Denies any history of renal stones.  No dysuria, hematuria, nausea or vomiting.  No fevers.  ROS: Per HPI  Allergies  Allergen Reactions  . Abilify [Aripiprazole] Anaphylaxis and Swelling    "slurred speech" FACIAL DROOPING, TONGUE SWELLING, TARDIVE DYSKENESIA   . Seroquel [Quetiapine Fumarate]     tardive dyskinesia  . Alcohol Itching    Drinking alcohol  . Ambien [Zolpidem] Other (See Comments)      NIGHT TERRORS  . Anacin-3 [Acetaminophen] Itching    REGULAR TYLENOL IS OKAY.  NEEDS BENADRYL TO TAKE THIS  . Cariprazine Other (See Comments)    Tardive dyskenesia  . Cariprazine Hcl Itching  . Latuda [Lurasidone Hcl] Other (See Comments)    RUNS SUGAR TOO HIGH  . Lurasidone Other (See Comments)    TARDIVE DYSKINESIA  . Prozac [Fluoxetine] Itching  . Victoza [Liraglutide] Other (See Comments)    Severe heartburn   . Bupropion Rash  . Hydrocodone-Acetaminophen Itching  . Risperidone Rash  . Tegretol [Carbamazepine] Rash   Past Medical History:  Diagnosis Date  . Anxiety   . Bipolar disorder ([place])   . CTS (carpal tunnel syndrome)   . Depression   . GERD (gastroesophageal reflux disease)   . H/O bariatric surgery 2016  . Head injury 05/06/2017  . Hypothyroidism   . Hypothyroidism   . Sleep apnea    no longer with OSA d/t over 100 pd weight loss    Current Outpatient Medications:  .  ALPRAZolam (XANAX) 1 MG tablet, 1 mg. Six times per day prn, Disp: , Rfl:  .  antiseptic oral rinse (BIOTENE) LIQD, 15 mLs by Mouth Rinse route as needed for dry mouth., Disp:  , Rfl:  .  Asenapine Maleate (SAPHRIS) 2.5 MG SUBL, Place 2.5 mg under the tongue at bedtime. CUTS A $Remo'5MG'xIGNq$  TAB IN HALF (Patient not taking: Reported  on 06/21/2020), Disp: , Rfl:  .  Blood Glucose Monitoring Suppl (ONETOUCH VERIO) w/Device KIT, Use to test blood sugar daily as directed, Disp: 1 kit, Rfl: 0 .  Calcium-Vitamin D-Vitamin K (SM CALCIUM SOFT CHEWS PO), Take 1 application by mouth 2 (two) times daily. Chew calcium tabs twice a day.  (bariatric surgery), Disp: , Rfl:  .  Cyanocobalamin (VITAMIN B-12) 5000 MCG TBDP, Take 1 tablet by mouth daily., Disp: , Rfl:  .  docusate sodium (EQUATE STOOL SOFTENER) 100 MG capsule, Take 100 mg by mouth 2 (two) times daily., Disp: , Rfl:  .  glucose blood test strip, Use to test blood sugar daily as directed, Disp: 100 each, Rfl: 12 .  lamoTRIgine (LAMICTAL) 200 MG tablet, Take  400 mg by mouth at bedtime. , Disp: , Rfl:  .  levothyroxine (SYNTHROID) 137 MCG tablet, Take 1 tablet (137 mcg total) by mouth daily before breakfast. Please DC the 145mcg, Disp: 90 tablet, Rfl: 1 .  metFORMIN (GLUCOPHAGE) 500 MG tablet, Take 1 tablet (500 mg total) by mouth 2 (two) times daily with a meal., Disp: 180 tablet, Rfl: 0 .  Multiple Vitamin (MULTIVITAMIN WITH MINERALS) TABS tablet, Take 1 tablet by mouth 2 (two) times daily. For Vitamin supplementation (Patient taking differently: Take 1 tablet by mouth 2 (two) times daily. For Vitamin supplementation FROM GASTRIC BYPASS), Disp: , Rfl:  .  OneTouch Delica Lancets 38S MISC, Use to test blood sugar daily as directed, Disp: 100 each, Rfl: 3 .  pantoprazole (PROTONIX) 40 MG tablet, Take 1 tablet (40 mg total) by mouth 2 (two) times daily., Disp: 180 tablet, Rfl: 3 .  promethazine (PHENERGAN) 12.5 MG tablet, TAKE 1 TO 2 TABLETS EVERY 8 HOURS AS NEEDED FOR NAUSEA AND VOMITING, Disp: 60 tablet, Rfl: 1 .  psyllium (METAMUCIL) 58.6 % powder, Take 1 packet by mouth 2 (two) times daily., Disp: , Rfl:  .  vitamin B-12 (CYANOCOBALAMIN) 250 MCG tablet, Take 1 tablet (250 mcg total) by mouth daily. For low B-12 supplementation, Disp: 1 tablet, Rfl:  Social History   Socioeconomic History  . Marital status: Married    Spouse name: Mickel Baas  . Number of children: 1  . Years of education: Not on file  . Highest education level: Not on file  Occupational History  . Not on file  Tobacco Use  . Smoking status: Never Smoker  . Smokeless tobacco: Never Used  Vaping Use  . Vaping Use: Never used  Substance and Sexual Activity  . Alcohol use: No    Comment: severe allergy to alcohol!!  . Drug use: No  . Sexual activity: Yes  Other Topics Concern  . Not on file  Social History Narrative   Patient lives in mothers' home. He stays alone until nighttime when wife comes up.    He stays there to keep an eye on the house.    His son comes to visit also.           Social Determinants of Health   Financial Resource Strain:   . Difficulty of Paying Living Expenses:   Food Insecurity:   . Worried About Charity fundraiser in the Last Year:   . Arboriculturist in the Last Year:   Transportation Needs:   . Film/video editor (Medical):   Marland Kitchen Lack of Transportation (Non-Medical):   Physical Activity:   . Days of Exercise per Week:   . Minutes of Exercise per Session:   Stress:   .  Feeling of Stress :   Social Connections:   . Frequency of Communication with Friends and Family:   . Frequency of Social Gatherings with Friends and Family:   . Attends Religious Services:   . Active Member of Clubs or Organizations:   . Attends Archivist Meetings:   Marland Kitchen Marital Status:   Intimate Partner Violence:   . Fear of Current or Ex-Partner:   . Emotionally Abused:   Marland Kitchen Physically Abused:   . Sexually Abused:    Family History  Problem Relation Age of Onset  . Diabetes Mother   . Heart disease Mother   . Depression Mother   . Esophageal cancer Mother   . COPD Father   . Heart disease Father   . Kidney disease Father   . Mental illness Father   . Depression Father   . Mental illness Sister   . Depression Sister   . Esophageal cancer Maternal Uncle   . Colon cancer Neg Hx   . Rectal cancer Neg Hx   . Stomach cancer Neg Hx     Objective: Office vital signs reviewed. BP (!) 141/94   Pulse 81   Temp 97.8 F (36.6 C) (Temporal)   Ht $R'5\' 10"'kq$  (1.778 m)   Wt 231 lb (104.8 kg)   SpO2 100%   BMI 33.15 kg/m   Physical Examination:  General: Awake, alert, well nourished, No acute distress HEENT: Normal, sclera white, MMM Pulmonary: Clear to auscultation bilaterally. Extremities: warm, well perfused, No edema, cyanosis or clubbing; +2 pulses bilaterally MSK: normal gait and station; patient ambulates independently  Thoracic: No CVA tenderness palpation.  Pain is not reproducible.  Pain is exacerbated by rotation.  No  palpable bony abnormalities.  Lumbar spine: No midline tenderness palpation.  No paraspinal muscle tenderness palpation. Neuro: Strength intact.  Light touch sensation grossly intact and symmetric.  Assessment/ Plan: 49 y.o. male   1. Acute right-sided thoracic back pain Personal review of x-ray demonstrated no evidence of collapsed lung, fracture or dislocation.  He was given a dose of Depo-Medrol 40 mg intramuscularly.  I have given him baclofen to help with spasm. - DG Ribs Unilateral W/Chest Right; Future - methylPREDNISolone acetate (DEPO-MEDROL) injection 40 mg - baclofen (LIORESAL) 10 MG tablet; Take 0.5-1 tablets (5-10 mg total) by mouth 3 (three) times daily as needed for muscle spasms.  Dispense: 30 each; Refill: 0  2. Chronic bilateral low back pain without sciatica Chronic but somewhat progressive and therefore we will repeat MRI.  Baclofen prescribed.  Home care instructions reviewed with the patient.  We will plan for referral to neurosurgery pending back imaging - MR Lumbar Spine Wo Contrast; Future - baclofen (LIORESAL) 10 MG tablet; Take 0.5-1 tablets (5-10 mg total) by mouth 3 (three) times daily as needed for muscle spasms.  Dispense: 30 each; Refill: 0  3. Controlled type 2 diabetes mellitus without complication, without long-term current use of insulin (HCC) Continue Metformin.  No orders of the defined types were placed in this encounter.  Meds ordered this encounter  Medications  . methylPREDNISolone acetate (DEPO-MEDROL) injection 40 mg  . baclofen (LIORESAL) 10 MG tablet    Sig: Take 0.5-1 tablets (5-10 mg total) by mouth 3 (three) times daily as needed for muscle spasms.    Dispense:  30 each    Refill:  0  . pantoprazole (PROTONIX) 40 MG tablet    Sig: Take 1 tablet (40 mg total) by mouth 2 (two) times daily. Failed Nexium,  Omeprazole, Prevacid OTC, Pepcid, Zantac    Dispense:  180 tablet    Refill:  [place], DO [place] [phone]

## 2020-08-07 ENCOUNTER — Telehealth: Payer: Self-pay | Admitting: Family Medicine

## 2020-08-08 DIAGNOSIS — F3181 Bipolar II disorder: Secondary | ICD-10-CM | POA: Diagnosis not present

## 2020-08-10 ENCOUNTER — Encounter: Payer: Self-pay | Admitting: Counselor

## 2020-08-10 ENCOUNTER — Ambulatory Visit (INDEPENDENT_AMBULATORY_CARE_PROVIDER_SITE_OTHER): Payer: BC Managed Care – PPO | Admitting: Counselor

## 2020-08-10 ENCOUNTER — Other Ambulatory Visit: Payer: Self-pay

## 2020-08-10 ENCOUNTER — Ambulatory Visit: Payer: BC Managed Care – PPO

## 2020-08-10 DIAGNOSIS — F439 Reaction to severe stress, unspecified: Secondary | ICD-10-CM | POA: Diagnosis not present

## 2020-08-10 DIAGNOSIS — F09 Unspecified mental disorder due to known physiological condition: Secondary | ICD-10-CM

## 2020-08-10 DIAGNOSIS — F3181 Bipolar II disorder: Secondary | ICD-10-CM

## 2020-08-10 NOTE — Progress Notes (Signed)
   Psychometrist Note   Cognitive testing was administered to Marvin Espinoza by Lamar Benes, B.S. (Technician) under the supervision of Alphonzo Severance, Psy.D., ABN. [name] was able to tolerate all test procedures. Dr. Nicole Kindred met with the patient as needed to manage any emotional reactions to the testing procedures. Rest breaks were offered.    The battery of tests administered was selected by Dr. Nicole Kindred with consideration to the patient's current level of functioning, the nature of his symptoms, emotional and behavioral responses during the interview, level of literacy, observed level of motivation/effort, and the nature of the referral question. This battery was communicated to the psychometrist. Communication between Dr. Nicole Kindred and the psychometrist was ongoing throughout the evaluation and Dr. Nicole Kindred was immediately accessible at all times. Dr. Nicole Kindred provided supervision to the technician on the date of this service, to the extent necessary to assure the quality of all services provided.    Mr. Schreier will return in approximately one week for an interactive feedback session with Dr. Nicole Kindred, at which time test performance, clinical impressions, and treatment recommendations will be reviewed in detail. The patient understands he can contact our office should he require our assistance before this time.   A total of 125 minutes of billable time were spent with Marvin Espinoza by the technician, including test administration and scoring time. Billing for these services is reflected in Dr. Les Pou note.   This note reflects time spent with the psychometrician and does not include test scores, clinical history, or any interpretations made by Dr. Nicole Kindred. The full report will follow in a separate note.
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
## 2020-08-11 NOTE — Progress Notes (Signed)
[facility]  Patient Name: Marvin Espinoza MRN: [id number] Date of Birth: Mar 17, 1971 Age: 49 y.o. Education: 12 years  Measurement properties of test scores: IQ, Index, and Standard Scores (SS): Mean = 100; Standard Deviation = 15 Scaled Scores (Ss): Mean = 10; Standard Deviation = 3 Z scores (Z): Mean = 0; Standard Deviation = 1 T scores (T); Mean = 50; Standard Deviation = 10  TEST SCORES:    Note: This summary of test scores accompanies the interpretive report and should not be interpreted by unqualified individuals or in isolation without reference to the report. Test scores are relative to age, gender, and educational history as available and appropriate.   Performance Validity        ACS: Raw Descriptor      Word Choice: 48 Within Expectation      MSVT: Raw Descriptor      IR 100 Within Expectation      DR 95 Within Expectation      CNS 95 Within Expectation      PA 100 ---      FR 80 ---      The Dot Counting Test: Raw Descriptor      E-Score 8 Within Expectation      Embedded Measures: Raw Descriptor      RBANS Effort Index: 2 Within Expectation      WAIS-IV Reliable Digit Span 10 Within Expectation      WAIS-IV Reliable Digit Span Revised 16 Within Expectation      Expected Functioning        Wide Range Achievement Test (Word Reading): Standard/Scaled Score Percentile       Word Reading 96 39      Cognitive Testing        RBANS, Form : Standard/Scaled Score Percentile  Total Score 83 13  Immediate Memory 85 16      List Learning 4 2      Story Memory 11 63  Visuospatial/Constructional 109 73      Figure Copy   (18) 10 50      Judgment of Line Orientation   (19) --- >75  Language 90 25      Picture Naming --- 51-75      Semantic Fluency 7 16  Attention 85 16      Digit Span 11 63      Coding 4 2  Delayed Memory 68 2      List Recall   (3) --- 3-9      List Recognition   (16) --- <2      Story Recall   (9) 9  37      Figure Recall   (7) 5 5      Wechsler Adult Intelligence Scale - IV: Standard/Scaled Score Percentile  Working Memory Index 95 37      Digit Span 9 37          Digit Span Forward 10 50          Digit Span Backward 9 37          Digit Span Sequencing 9 37      Arithmetic 9 37  Processing Speed Index 74 4      Symbol Search 5 5      Coding 5 5      Neuropsychological Assessment Battery (Language Module): T-score Percentile      Naming   (30) 54 66      Verbal Fluency:  T-score Percentile      Controlled Oral Word Association (F-A-S) 32 4      Semantic Fluency (Animals) 41 18      Trail Making Test: T-Score Percentile      Part A 48 42      Part B 46 34      Modified [place] Card Sorting Test (MWCST): Standard/T-Score Percentile      Number of Categories Correct 57 75      Number of Perseverative Errors 64 92      Number of Total Errors 64 92      Percent Perseverative Errors 63 91  Executive Function Composite 122 93      Boston Diagnostic Aphasia Exam: Raw Score Scaled Score      Complex Ideational Material 12 12      Rating Scales         Raw Score Descriptor  Patient Health Questionnaire - 9 25 Severe  GAD-7 21 Severe       Raw Score Descriptor  PTSD Checklist - 5 66 Positive   Chellsie Gomer V. Nicole Kindred PsyD, [place] Clinical Neuropsychologist
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
## 2020-08-17 ENCOUNTER — Ambulatory Visit (INDEPENDENT_AMBULATORY_CARE_PROVIDER_SITE_OTHER): Payer: BC Managed Care – PPO | Admitting: Counselor

## 2020-08-17 ENCOUNTER — Encounter: Payer: Self-pay | Admitting: Counselor

## 2020-08-17 ENCOUNTER — Other Ambulatory Visit: Payer: Self-pay

## 2020-08-17 DIAGNOSIS — G3184 Mild cognitive impairment, so stated: Secondary | ICD-10-CM | POA: Diagnosis not present

## 2020-08-17 DIAGNOSIS — F439 Reaction to severe stress, unspecified: Secondary | ICD-10-CM | POA: Diagnosis not present

## 2020-08-17 DIAGNOSIS — G4700 Insomnia, unspecified: Secondary | ICD-10-CM

## 2020-08-17 NOTE — Progress Notes (Signed)
[facility]  Telemedicine statement:  I discussed the limitations of neuropsychological care via telemedicine and the availability of in person appointments. The patient expressed understanding and agreed to proceed. The patient was verified with two identifiers.  The visit modality was: telephonic The patient location was: home The provider location was: office  The following individuals participated: Marvin Espinoza Feedback Note: I met with Marvin Espinoza to review the findings resulting from her husband's neuropsychological evaluation. The patient himself was at work. I offered to reschedule and or arrange for a conference call but the patient apparently preferred for his wife to get the feedback directly. I encouraged her to have her husband contact me if he would like in-person feedback himself. Since the last appointment, he has been a bit better. He started working this week and is now sleeping better and feels a bit more focused. Time was spent reviewing the impressions and recommendations that are detailed in the evaluation report. We discussed impression of some mild cognitive difficulties, with decline in processing efficiency, although no very major changes since his prior eval. My sense is that his difficulties are primarily due to his mental health conditions, diminished sleep, and the like and the index of suspicion for an underlying disorder is low given his presentation, test performance, and imaging. I took time to explain the findings and answer all the patient's questions. I encouraged Marvin Espinoza to contact me should he have any further questions or if further follow up is desired.   Current Medications and Medical History   Current Outpatient Medications  Medication Sig Dispense Refill  . ALPRAZolam (XANAX) 1 MG tablet 1 mg. Six times per day prn    . antiseptic oral rinse (BIOTENE) LIQD 15 mLs by Mouth Rinse route as needed for dry mouth.     . Asenapine Maleate (SAPHRIS) 2.5 MG SUBL Place 2.5 mg under the tongue at bedtime. CUTS A 5MG TAB IN HALF (Patient not taking: Reported on 08/02/2020)    . baclofen (LIORESAL) 10 MG tablet Take 0.5-1 tablets (5-10 mg total) by mouth 3 (three) times daily as needed for muscle spasms. 30 each 0  . Blood Glucose Monitoring Suppl (ONETOUCH VERIO) w/Device KIT Use to test blood sugar daily as directed 1 kit 0  . Calcium-Vitamin D-Vitamin K (SM CALCIUM SOFT CHEWS PO) Take 1 application by mouth 2 (two) times daily. Chew calcium tabs twice a day.  (bariatric surgery)    . Cyanocobalamin (VITAMIN B-12) 5000 MCG TBDP Take 1 tablet by mouth daily.    Marland Kitchen docusate sodium (EQUATE STOOL SOFTENER) 100 MG capsule Take 100 mg by mouth 2 (two) times daily.    Marland Kitchen glucose blood test strip Use to test blood sugar daily as directed 100 each 12  . lamoTRIgine (LAMICTAL) 200 MG tablet Take 400 mg by mouth at bedtime.     Marland Kitchen levothyroxine (SYNTHROID) 137 MCG tablet Take 1 tablet (137 mcg total) by mouth daily before breakfast. Please DC the 130mg 90 tablet 1  . metFORMIN (GLUCOPHAGE) 500 MG tablet Take 1 tablet (500 mg total) by mouth 2 (two) times daily with a meal. 180 tablet 0  . Multiple Vitamin (MULTIVITAMIN WITH MINERALS) TABS tablet Take 1 tablet by mouth 2 (two) times daily. For Vitamin supplementation (Patient taking differently: Take 1 tablet by mouth 2 (two) times daily. For Vitamin supplementation FROM GASTRIC BYPASS)    . OneTouch Delica Lancets 343XMISC Use to test blood sugar daily as directed 100  each 3  . pantoprazole (PROTONIX) 40 MG tablet Take 1 tablet (40 mg total) by mouth 2 (two) times daily. Failed Nexium, Omeprazole, Prevacid OTC, Pepcid, Zantac 180 tablet 3  . promethazine (PHENERGAN) 12.5 MG tablet TAKE 1 TO 2 TABLETS EVERY 8 HOURS AS NEEDED FOR NAUSEA AND VOMITING 60 tablet 1  . psyllium (METAMUCIL) 58.6 % powder Take 1 packet by mouth 2 (two) times daily.    . vitamin B-12 (CYANOCOBALAMIN) 250  MCG tablet Take 1 tablet (250 mcg total) by mouth daily. For low B-12 supplementation 1 tablet    No current facility-administered medications for this visit.    Patient Active Problem List   Diagnosis Date Noted  . History of gastric surgery 03/21/2020  . Overdose of benzodiazepine, intentional self-harm, initial encounter ([place]) 01/26/2020  . MDD (major depressive disorder), recurrent severe, without psychosis ([place]) 01/26/2020  . Drug overdose 01/25/2020  . Onychomycosis 01/05/2019  . Neuropathy 01/05/2019  . Severe episode of recurrent major depressive disorder, without psychotic features ([place]) 10/04/2018  . OSA (obstructive sleep apnea) 09/07/2018  . Bilateral hip pain 10/24/2017  . Shoulder pain, bilateral 10/24/2017  . Arthritis 10/24/2017  . Osteoarthritis of both hips 10/24/2017  . Avascular necrosis of bone of hip, right ([place]) 10/24/2017  . 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 PTSD (post-traumatic stress disorder) 08/20/2017  . Tardive dyskinesia 08/20/2017  . Severe major depression ([place]) 05/10/2017  . Affective psychosis, bipolar ([place]) 05/10/2017  . Vision disturbance 05/06/2017  . History of concussion 02/10/2017  . Mood disorder ([place]) 02/10/2017  . Adjustment insomnia 01/24/2017  . Adjustment disorder with anxious mood 01/24/2017  . GAD (generalized anxiety disorder) 01/24/2017  . Gastroesophageal reflux disease without esophagitis 11/19/2016  . Hypothyroidism 11/19/2016  . DDD (degenerative disc disease), lumbar 11/19/2016    Mental Status and Behavioral Observations  Not obtained as feedback was provided to the patient's wife only.   Plan  Feedback provided regarding the patient's neuropsychological evaluation. He will get in touch if he wishes for further in-person feedback himself. I was candid that I think his mental health issues are the primary concern and that he should engage in an empirically supported form of trauma focused therapy, after undergoing  a thorough intake evaluation. I am unaware of any other than anecdotal research examining the efficacy of "brain spotting" therapy for trauma-related conditions. Marvin Espinoza was encouraged to contact me if any questions arise or if further follow up is desired.   Viviano Simas Nicole Kindred, PsyD, ABN Clinical Neuropsychologist  Service(s) Provided at This Encounter: 32 minutes [phone]; Conjoint therapy with patient present)

## 2020-08-17 NOTE — Patient Instructions (Signed)
Your performance and presentation on assessment today was consistent with a mild level of cognitive difficulty that can be summed up as reflecting difficulties with processing information efficiently and executive control. As compared to your previous assessment, there is suggestion of a decline in processing speed, and there were some trends toward lower performance in other areas. 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   In your case, I think that your cognitive difficulties are likely on the basis of continued interference from your mental health conditions. Difficulties with memory, attention, concentration and the like are common in the setting of psychiatric illness. These difficulties can also potentially worsen with age. One way to think about aging in the setting of major mental illness is as accelerated aging, which may also be contributory.   The focus of treatment should be on your mental health conditions at this point in time. The profile of scores you produced is not suggestive of an incipient dementia syndrome, such as Alzheimer's disease, and therefore I think your mental health issues are the main cause of your problem.   You are already following with Dr. Toy Care and a psychotherapist. You expressed some dissatisfaction with your psychological treatment, and I would suggest that you talk about this with your treating provider.   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 The most effective treatments for trauma and stressor related disorders is psychotherapy, and I recommend that you complete a course of an empirically supported trauma-focused treatment such as CPT or Prolonged Exposure.    The positive news is that if your difficulties with memory and thinking are due to your mental health conditions and poor sleep, then they may remit and or improve with better stabilization. Keep working on that with your medical and psychological treatment providers.   There are few things as disruptive to brain functioning as not getting a good night's sleep. For sleep, I recommend against using medications, which can have lingering sedating effects on the brain and rob your brain of restful REM sleep. Instead, consider trying some of the following sleep hygiene recommendations. They may not work at once and may take effort, but the effort you spend is likely to be rewarded with better sleep eventually:   Stick to a sleep schedule of the same bedtime and wake time even on the weekends, which can help to regulate your body's internal clock so that you fall asleep and stay asleep.   Practice a relaxing bedtime ritual (conducted away from bright lights) which will help separate your sleep from stimulating activities and prepare your body to fall asleep when you go to bed.   Avoid naps, especially in the afternoon.   Evaluate your room and create conditions that will promote sleep such as keeping it cool (between 60 - 67 degrees), quiet, and free from any lights. Consider using blackout curtains, a "white noise" generator, or fan that will help mask any noises that might prevent you from going to sleep or awaken you during the night.   Sleep on a comfortable mattress and pillows.   Avoid bright light in the evening and excessive use of portable electronic devices right before bed that may contain light frequencies that can contribute to sleep problems.   Avoid alcohol, cigarettes,  or heavy meals in the evening. If you must eat, consume a light snack 45 minutes before bed.   Use your bed only for sleep to strengthen the association between your bed and sleep.   If you can't go to sleep within  30 minutes, go into another room and do something relaxing until you feel tired. Then, come back and try to go to sleep again for 30 minutes and repeat until sleep is achieved.   Some people find over the counter melatonin to be helpful for sleep, which you could discuss with a pharmacist or prescribing provider.   For problems with attention and concentration, consider the following recommendations:  Build routines into your day and stick to them, doing the same thing at the same time helps your body get into a rhythm that makes it harder to forget something you need to do.   Use sticky notes, reminders, a calendar, or your smart phone to provide yourself with reminders, to do lists, and help track appointments.   When you need to do work for school or other things, create an environment that is conducive to that work. This may include putting electronic devices that can be distracting outside the room and working in an area that is quiet and free from distractions.   Break things down into smaller steps to help get started and stop yourself from feeling overwhelmed.   Plan breaks throughout the day where you can get up and move even if it's for just a few minutes at a time.   Focus your attention on only one thing and avoid multitasking. Although some people are better at it than others, nobody's task performance is as good as it could be when they are alternating attention between two different tasks.   Stay mindful throughout your day and monitor whether you are feeling overwhelmed or disorganized to identify problems before they happen.   Avoid working under time pressure when you may be more liable to make mistakes.

## 2020-08-18 ENCOUNTER — Telehealth: Payer: Self-pay | Admitting: Family Medicine

## 2020-08-31 DIAGNOSIS — F3111 Bipolar disorder, current episode manic without psychotic features, mild: Secondary | ICD-10-CM | POA: Diagnosis not present

## 2020-08-31 DIAGNOSIS — F3131 Bipolar disorder, current episode depressed, mild: Secondary | ICD-10-CM | POA: Diagnosis not present

## 2020-09-15 ENCOUNTER — Other Ambulatory Visit: Payer: Self-pay | Admitting: Family Medicine

## 2020-09-27 ENCOUNTER — Other Ambulatory Visit: Payer: Self-pay

## 2020-09-27 ENCOUNTER — Encounter: Payer: Self-pay | Admitting: Family Medicine

## 2020-09-27 ENCOUNTER — Ambulatory Visit (INDEPENDENT_AMBULATORY_CARE_PROVIDER_SITE_OTHER): Payer: BC Managed Care – PPO | Admitting: Family Medicine

## 2020-09-27 VITALS — BP 111/67 | HR 67 | Temp 97.6°F | Ht 70.0 in | Wt 216.0 lb

## 2020-09-27 DIAGNOSIS — F322 Major depressive disorder, single episode, severe without psychotic features: Secondary | ICD-10-CM | POA: Diagnosis not present

## 2020-09-27 DIAGNOSIS — M546 Pain in thoracic spine: Secondary | ICD-10-CM

## 2020-09-27 DIAGNOSIS — G8929 Other chronic pain: Secondary | ICD-10-CM

## 2020-09-27 DIAGNOSIS — E039 Hypothyroidism, unspecified: Secondary | ICD-10-CM

## 2020-09-27 DIAGNOSIS — M5136 Other intervertebral disc degeneration, lumbar region: Secondary | ICD-10-CM

## 2020-09-27 DIAGNOSIS — Z23 Encounter for immunization: Secondary | ICD-10-CM | POA: Diagnosis not present

## 2020-09-27 DIAGNOSIS — Z9884 Bariatric surgery status: Secondary | ICD-10-CM

## 2020-09-27 DIAGNOSIS — M545 Low back pain, unspecified: Secondary | ICD-10-CM

## 2020-09-27 DIAGNOSIS — E1169 Type 2 diabetes mellitus with other specified complication: Secondary | ICD-10-CM

## 2020-09-27 LAB — BAYER DCA HB A1C WAIVED: HB A1C (BAYER DCA - WAIVED): 6 % (ref <7.0)

## 2020-09-27 MED ORDER — PANTOPRAZOLE SODIUM 40 MG PO TBEC
40.0000 mg | DELAYED_RELEASE_TABLET | Freq: Two times a day (BID) | ORAL | 3 refills | Status: DC
Start: 2020-09-27 — End: 2021-10-05

## 2020-09-27 MED ORDER — BACLOFEN 10 MG PO TABS: 5.0000 mg | ORAL_TABLET | Freq: Three times a day (TID) | ORAL | 0 refills | Status: DC | PRN

## 2020-09-27 NOTE — Patient Instructions (Signed)
A1c down to 6.0.  Continue Metformin twice daily for now.  We may be able to stop this in the future.  I've given you samples of high dose Linzess and a new product called trulance  Start with Linzess.  Take ONE daily for constipation.  Ok to continue Metamucil but may be able to back off on stool softeners.  If no improvement after completion of samples, switch to Trulance.  Take ONE daily for constipation.  Let me know which works and we'll go from there.

## 2020-09-27 NOTE — Progress Notes (Signed)
Subjective: CC: Type 2 DM PCP: Janora Norlander, DO MBW:GYKZLDJ Marvin Espinoza is a 49 y.o. male presenting to clinic today for:   1.  Type 2 diabetes; constipation Patient reports that blood sugars have been improving with the Metformin.  He is taking this twice daily.  He is really cut back on carbohydrates and is mostly eating protein or protein meal replacement items.  He does still can incorporate quite a bit of vegetables and water.  He is on Metamucil for fiber supplementation as well.  He has had unfortunately quite a bit of constipation despite use of Metformin.  He was considering switching to Amaryl if this would help with that.  Previously has had diarrhea with Metformin so he was quite surprised that he was becoming constipated.  He is using a stool softener and occasionally Dulcolax.  He has used Linzess in the past but cannot recall what the dose was.  He did not find that to be helpful.  2.  Right sided thoracic back pain/chronic low back pain Ongoing chronic lumbar pain that was previously evaluated with MRI at [facility].  Again, he was not determined to be surgical at that time.  Pain has radiated to low back pain into bilateral buttocks, particularly when he performs certain activities that require exertion.  Again, certain positions also make this worse.  Pain comes and goes and waxes and wanes in severity.  He is unable to tolerate oral NSAIDs and many other medications secondary to his bariatric surgery and multiple drug allergies.  He has had no falls.  He does not report any new sensory changes, fecal incontinence, urinary retention or saddle anesthesia.  His MRI that was previously ordered was denied because they wanted him to get rechecked for any progression or improvement in symptoms.  However, he reports no improvement in symptoms since her last visit but he is also not progressed.  Baclofen has been somewhat helpful but is not long-lasting.  3.  GERD Patient reports ongoing  GERD symptoms despite use of formulary alternative acid reflux medicine.  He would like to see if we can try getting the Protonix through his insurance again.  ROS: Per HPI  Allergies  Allergen Reactions  . Abilify [Aripiprazole] Anaphylaxis and Swelling    "slurred speech" FACIAL DROOPING, TONGUE SWELLING, TARDIVE DYSKENESIA   . Seroquel [Quetiapine Fumarate]     tardive dyskinesia  . Alcohol Itching    Drinking alcohol  . Ambien [Zolpidem] Other (See Comments)    NIGHT TERRORS  . Anacin-3 [Acetaminophen] Itching    REGULAR TYLENOL IS OKAY.  NEEDS BENADRYL TO TAKE THIS  . Cariprazine Other (See Comments)    Tardive dyskenesia  . Cariprazine Hcl Itching  . Latuda [Lurasidone Hcl] Other (See Comments)    RUNS SUGAR TOO HIGH  . Lurasidone Other (See Comments)    TARDIVE DYSKINESIA  . Prozac [Fluoxetine] Itching  . Victoza [Liraglutide] Other (See Comments)    Severe heartburn   . Bupropion Rash  . Hydrocodone-Acetaminophen Itching  . Risperidone Rash  . Tegretol [Carbamazepine] Rash   Past Medical History:  Diagnosis Date  . Anxiety   . Bipolar disorder ([place])   . CTS (carpal tunnel syndrome)   . Depression   . GERD (gastroesophageal reflux disease)   . H/O bariatric surgery 2016  . Head injury 05/06/2017  . Hypothyroidism   . Hypothyroidism   . Sleep apnea    no longer with OSA d/t over 100 pd weight loss  Current Outpatient Medications:  .  ALPRAZolam (XANAX) 1 MG tablet, 1 mg. Six times per day prn, Disp: , Rfl:  .  antiseptic oral rinse (BIOTENE) LIQD, 15 mLs by Mouth Rinse route as needed for dry mouth., Disp:  , Rfl:  .  Asenapine Maleate (SAPHRIS) 2.5 MG SUBL, Place 2.5 mg under the tongue at bedtime. CUTS A 5MG TAB IN HALF (Patient not taking: Reported on 08/02/2020), Disp: , Rfl:  .  baclofen (LIORESAL) 10 MG tablet, Take 0.5-1 tablets (5-10 mg total) by mouth 3 (three) times daily as needed for muscle spasms., Disp: 30 each, Rfl: 0 .  Blood Glucose  Monitoring Suppl (ONETOUCH VERIO) w/Device KIT, Use to test blood sugar daily as directed, Disp: 1 kit, Rfl: 0 .  Calcium-Vitamin D-Vitamin K (SM CALCIUM SOFT CHEWS PO), Take 1 application by mouth 2 (two) times daily. Chew calcium tabs twice a day.  (bariatric surgery), Disp: , Rfl:  .  Cyanocobalamin (VITAMIN B-12) 5000 MCG TBDP, Take 1 tablet by mouth daily., Disp: , Rfl:  .  docusate sodium (EQUATE STOOL SOFTENER) 100 MG capsule, Take 100 mg by mouth 2 (two) times daily., Disp: , Rfl:  .  glucose blood test strip, Use to test blood sugar daily as directed, Disp: 100 each, Rfl: 12 .  lamoTRIgine (LAMICTAL) 200 MG tablet, Take 400 mg by mouth at bedtime. , Disp: , Rfl:  .  levothyroxine (SYNTHROID) 137 MCG tablet, Take 1 tablet (137 mcg total) by mouth daily before breakfast. Please DC the 137mg, Disp: 90 tablet, Rfl: 1 .  metFORMIN (GLUCOPHAGE) 500 MG tablet, TAKE ONE TABLET TWICE A DAY WITH MEALS., Disp: 180 tablet, Rfl: 0 .  Multiple Vitamin (MULTIVITAMIN WITH MINERALS) TABS tablet, Take 1 tablet by mouth 2 (two) times daily. For Vitamin supplementation (Patient taking differently: Take 1 tablet by mouth 2 (two) times daily. For Vitamin supplementation FROM GASTRIC BYPASS), Disp: , Rfl:  .  OneTouch Delica Lancets 374YMISC, Use to test blood sugar daily as directed, Disp: 100 each, Rfl: 3 .  pantoprazole (PROTONIX) 40 MG tablet, Take 1 tablet (40 mg total) by mouth 2 (two) times daily. Failed Nexium, Omeprazole, Prevacid OTC, Pepcid, Zantac, Disp: 180 tablet, Rfl: 3 .  promethazine (PHENERGAN) 12.5 MG tablet, TAKE 1 TO 2 TABLETS EVERY 8 HOURS AS NEEDED FOR NAUSEA AND VOMITING, Disp: 60 tablet, Rfl: 1 .  psyllium (METAMUCIL) 58.6 % powder, Take 1 packet by mouth 2 (two) times daily., Disp: , Rfl:  .  vitamin B-12 (CYANOCOBALAMIN) 250 MCG tablet, Take 1 tablet (250 mcg total) by mouth daily. For low B-12 supplementation, Disp: 1 tablet, Rfl:  Social History   Socioeconomic History  . Marital  status: Married    Spouse name: LMickel Baas . Number of children: 1  . Years of education: Not on file  . Highest education level: Not on file  Occupational History  . Not on file  Tobacco Use  . Smoking status: Never Smoker  . Smokeless tobacco: Never Used  Vaping Use  . Vaping Use: Never used  Substance and Sexual Activity  . Alcohol use: No    Comment: severe allergy to alcohol!!  . Drug use: No  . Sexual activity: Yes  Other Topics Concern  . Not on file  Social History Narrative   Patient lives in mothers' home. He stays alone until nighttime when wife comes up.    He stays there to keep an eye on the house.    His  son comes to visit also.          Social Determinants of Health   Financial Resource Strain:   . Difficulty of Paying Living Expenses: Not on file  Food Insecurity:   . Worried About Charity fundraiser in the Last Year: Not on file  . Ran Out of Food in the Last Year: Not on file  Transportation Needs:   . Lack of Transportation (Medical): Not on file  . Lack of Transportation (Non-Medical): Not on file  Physical Activity:   . Days of Exercise per Week: Not on file  . Minutes of Exercise per Session: Not on file  Stress:   . Feeling of Stress : Not on file  Social Connections:   . Frequency of Communication with Friends and Family: Not on file  . Frequency of Social Gatherings with Friends and Family: Not on file  . Attends Religious Services: Not on file  . Active Member of Clubs or Organizations: Not on file  . Attends Archivist Meetings: Not on file  . Marital Status: Not on file  Intimate Partner Violence:   . Fear of Current or Ex-Partner: Not on file  . Emotionally Abused: Not on file  . Physically Abused: Not on file  . Sexually Abused: Not on file   Family History  Problem Relation Age of Onset  . Diabetes Mother   . Heart disease Mother   . Depression Mother   . Esophageal cancer Mother   . COPD Father   . Heart disease  Father   . Kidney disease Father   . Mental illness Father   . Depression Father   . Mental illness Sister   . Depression Sister   . Esophageal cancer Maternal Uncle   . Colon cancer Neg Hx   . Rectal cancer Neg Hx   . Stomach cancer Neg Hx     Objective: Office vital signs reviewed. BP 111/67   Pulse 67   Temp 97.6 F (36.4 C)   Ht '5\' 10"'  (1.778 m)   Wt 216 lb (98 kg)   SpO2 97%   BMI 30.99 kg/m   Physical Examination:  General: Awake, alert, well nourished, No acute distress HEENT: Normal, sclera white, MMM Pulmonary: Clear to auscultation bilaterally. Extremities: warm, well perfused, No edema, cyanosis or clubbing; +2 pulses bilaterally MSK: normal gait and station; patient ambulates independently  Lumbar spine: No midline tenderness palpation.  No paraspinal muscle tenderness palpation.  Assessment/ Plan: 49 y.o. male   1. Controlled type 2 diabetes mellitus with other specified complication, without long-term current use of insulin (HCC) Continues to have well-controlled diabetes at 6.0.  I would like to see how he does over the holidays and if he can get his sugar under an A1c of 6 we can consider discontinuing the Metformin again - Bayer DCA Hb A1c Waived - CMP14+EGFR  2. Acquired hypothyroidism Check thyroid panel.  He is constipated - Thyroid Panel With TSH  3. Severe major depression (HCC) His symptoms actually seem to be a little better today than when I seen him previously.  Continue to follow-up with specialist as scheduled - CMP14+EGFR  4. DDD (degenerative disc disease), lumbar Remains in acute on chronic flare.  MRI has been reordered in efforts to get him checked out again.  He may need referral back to his back surgeon - CMP14+EGFR - MR Lumbar Spine Wo Contrast; Future  5. History of bariatric surgery Intolerant to NSAIDs and many  other medications in the setting of medication hypersensitivity - CMP14+EGFR  6. Need for immunization against  influenza Administered - Flu Vaccine QUAD 36+ mos IM  7. Acute right-sided thoracic back pain Baclofen renewed - baclofen (LIORESAL) 10 MG tablet; Take 0.5-1 tablets (5-10 mg total) by mouth 3 (three) times daily as needed for muscle spasms.  Dispense: 30 each; Refill: 0  8. Chronic bilateral low back pain without sciatica - baclofen (LIORESAL) 10 MG tablet; Take 0.5-1 tablets (5-10 mg total) by mouth 3 (three) times daily as needed for muscle spasms.  Dispense: 30 each; Refill: 0   No orders of the defined types were placed in this encounter.  No orders of the defined types were placed in this encounter.    Janora Norlander, DO [place] [phone]

## 2020-09-28 LAB — CMP14+EGFR
ALT: 34 IU/L (ref 0–44)
AST: 25 IU/L (ref 0–40)
Albumin/Globulin Ratio: 2.1 (ref 1.2–2.2)
Albumin: 4.8 g/dL (ref 4.0–5.0)
Alkaline Phosphatase: 74 IU/L (ref 44–121)
BUN/Creatinine Ratio: 15 (ref 9–20)
BUN: 15 mg/dL (ref 6–24)
Bilirubin Total: 0.3 mg/dL (ref 0.0–1.2)
CO2: 25 mmol/L (ref 20–29)
Calcium: 10 mg/dL (ref 8.7–10.2)
Chloride: 101 mmol/L (ref 96–106)
Creatinine, Ser: 0.98 mg/dL (ref 0.76–1.27)
GFR calc Af Amer: 103 mL/min/{1.73_m2} (ref >59)
GFR calc non Af Amer: 90 mL/min/{1.73_m2} (ref >59)
Globulin, Total: 2.3 g/dL (ref 1.5–4.5)
Glucose: 106 mg/dL — ABNORMAL HIGH (ref 65–99)
Potassium: 4.6 mmol/L (ref 3.5–5.2)
Sodium: 141 mmol/L (ref 134–144)
Total Protein: 7.1 g/dL (ref 6.0–8.5)

## 2020-09-28 LAB — THYROID PANEL WITH TSH
Free Thyroxine Index: 3.1 (ref 1.2–4.9)
T3 Uptake Ratio: 31 % (ref 24–39)
T4, Total: 10 ug/dL (ref 4.5–12.0)
TSH: 3.62 u[IU]/mL (ref 0.450–4.500)

## 2020-09-29 ENCOUNTER — Other Ambulatory Visit: Payer: Self-pay | Admitting: Family Medicine

## 2020-10-02 ENCOUNTER — Other Ambulatory Visit: Payer: Self-pay | Admitting: Family Medicine

## 2020-10-02 DIAGNOSIS — M5136 Other intervertebral disc degeneration, lumbar region: Secondary | ICD-10-CM

## 2020-10-04 ENCOUNTER — Other Ambulatory Visit: Payer: Self-pay

## 2020-10-04 ENCOUNTER — Ambulatory Visit: Payer: BC Managed Care – PPO | Attending: Family Medicine | Admitting: Physical Therapy

## 2020-10-04 DIAGNOSIS — G8929 Other chronic pain: Secondary | ICD-10-CM | POA: Insufficient documentation

## 2020-10-04 DIAGNOSIS — R293 Abnormal posture: Secondary | ICD-10-CM | POA: Diagnosis not present

## 2020-10-04 DIAGNOSIS — M545 Low back pain, unspecified: Secondary | ICD-10-CM | POA: Insufficient documentation

## 2020-10-04 NOTE — Therapy (Signed)
[facility] [place], [place], [address] Phone: [phone]   Fax:  [phone]  Physical Therapy Evaluation  Patient Details  Name: Marvin Espinoza MRN: [id number] Date of Birth: Nov 23, 1971 Referring Provider (PT): Ronnie Doss DO   Encounter Date: 10/04/2020   PT End of Session - 10/04/20 1352    Visit Number 1    Number of Visits 6    Date for PT Re-Evaluation 11/15/20    PT Start Time 0900    PT Stop Time 0955    PT Time Calculation (min) 55 min    Activity Tolerance Patient tolerated treatment well    Behavior During Therapy [facility] for tasks assessed/performed           Past Medical History:  Diagnosis Date  . Anxiety   . Bipolar disorder ([place])   . CTS (carpal tunnel syndrome)   . Depression   . GERD (gastroesophageal reflux disease)   . H/O bariatric surgery 2016  . Head injury 05/06/2017  . Hypothyroidism   . Hypothyroidism   . Sleep apnea    no longer with OSA d/t over 100 pd weight loss    Past Surgical History:  Procedure Laterality Date  . ANAL FISSURE REPAIR    . BARIATRIC SURGERY    . CARPAL TUNNEL RELEASE Left 05/09/2015   Procedure: LEFT CARPAL TUNNEL RELEASE;  Surgeon: Daryll Brod, MD;  Location: [place];  Service: Orthopedics;  Laterality: Left;  . CARPAL TUNNEL RELEASE Left 05-09-2015  . CARPAL TUNNEL RELEASE Right 06/20/2015   Procedure: RIGHT CARPAL TUNNEL RELEASE;  Surgeon: Daryll Brod, MD;  Location: [place];  Service: Orthopedics;  Laterality: Right;  REGIONAL/FAB  . CHOLECYSTECTOMY    . COLONOSCOPY    . UPPER GASTROINTESTINAL ENDOSCOPY      There were no vitals filed for this visit.    Subjective Assessment - 10/04/20 1358    Subjective COVID-19 screen performed prior to patient entering clinic.  The patient presents to the clinic today with c/o chronic low back pain that has been ongoing for at least 8 years.  His pain is rated at an 6/10 today but  can rise to an 8/10 with prolonged sitting and lumbar extension. He has occasions when the pain radiates into his buttocks.  Lying straight decreases his pain    Pertinent History Bi-polar, CTR, h/o bilateral shoulder pain, head injury, hypothyroidism.    How long can you sit comfortably? Varies.    Patient Stated Goals Reduce pain.    Currently in Pain? Yes    Pain Score 6     Pain Location Back    Pain Orientation Right;Left    Pain Descriptors / Indicators Sharp    Pain Type Chronic pain    Pain Onset Other (comment)    Aggravating Factors  See above.    Pain Relieving Factors See above.              [facility] PT Assessment - 10/04/20 0001      Assessment   Medical Diagnosis Lumbar DDD.    Referring Provider (PT) Ronnie Doss DO    Onset Date/Surgical Date --   ~8 years.     Precautions   Precautions None      Restrictions   Weight Bearing Restrictions No      Balance Screen   Has the patient fallen in the past 6 months No    Has the patient had a decrease in activity  level because of a fear of falling?  Yes    Is the patient reluctant to leave their home because of a fear of falling?  No      Prior Function   Level of Independence Independent      Posture/Postural Control   Posture/Postural Control Postural limitations    Postural Limitations Decreased lumbar lordosis      Deep Tendon Reflexes   DTR Assessment Site Patella;Achilles    Patella DTR 2+    Achilles DTR 0      ROM / Strength   AROM / PROM / Strength AROM;Strength      AROM   Overall AROM Comments 25% active lumbar flexion deficit and extension limited to 15 degrees and painful.      Strength   Overall Strength Comments Normal LE strength.      Palpation   Palpation comment Patient c/o pain across his lower lumbar region and palpable pain over his left SIJ.      Special Tests   Other special tests Equal leg lengths, (-) SLR and FABER testing.      Ambulation/Gait   Gait Comments WNL.                       Objective measurements completed on examination: See above findings.       [facility] Adult PT Treatment/Exercise - 10/04/20 0001      Modalities   Modalities Electrical Stimulation;Moist Heat      Moist Heat Therapy   Number Minutes Moist Heat 20 Minutes    Moist Heat Location Lumbar Spine      Electrical Stimulation   Electrical Stimulation Location Lower lumbar.    Electrical Stimulation Action IFC    Electrical Stimulation Parameters 80-150 Hz x 20 minutes.    Electrical Stimulation Goals Tone;Pain                       PT Long Term Goals - 10/04/20 1419      PT LONG TERM GOAL #1   Title Independent with a HEP.    Time 6    Period Weeks    Status New      PT LONG TERM GOAL #2   Title Sit 30 minutes with pain not > 3/10.    Time 6    Period Weeks    Status New      PT LONG TERM GOAL #3   Title Perform ADL's with pain not > 4/10.    Time 6    Period Weeks    Status New                  Plan - 10/04/20 1352    Clinical Impression Statement The patient presents to OPPT with c/o low back pain that increases to high levels with prolonged sitting and moving into extension.  He has diffuse pain across hos low back and over his left SIJ.  Special testing is negative.  Patient will benefit from skilled physical therapy intervention to address deficits and pain.    Personal Factors and Comorbidities Comorbidity 1;Comorbidity 2;Comorbidity 3+    Comorbidities Bi-polar, CTR, h/o bilateral shoulder pain, head injury, hypothyroidism.    Examination-Activity Limitations Sit;Locomotion Level    Examination-Participation Restrictions Other    Stability/Clinical Decision Making Evolving/Moderate complexity    Clinical Decision Making Low    Rehab Potential Good    PT Frequency 1x / week   1 time  per week times 6 weeks (per patient request).   PT Duration 6 weeks    PT Treatment/Interventions ADLs/Self Care Home  Management;Cryotherapy;Electrical Stimulation;Traction;Moist Heat;Therapeutic activities;Manual techniques;Patient/family education;Passive range of motion;Dry needling;Spinal Manipulations;Joint Manipulations    PT Next Visit Plan Core exercise progression.  Modalites and STW/M as needed.Marland Kitchen    Consulted and Agree with Plan of Care Patient           Patient will benefit from skilled therapeutic intervention in order to improve the following deficits and impairments:  Pain, Decreased activity tolerance, Postural dysfunction, Decreased range of motion  Visit Diagnosis: Chronic bilateral low back pain without sciatica - Plan: PT plan of care cert/re-cert  Abnormal posture - Plan: PT plan of care cert/re-cert     Problem List Patient Active Problem List   Diagnosis Date Noted  . History of gastric surgery 03/21/2020  . Overdose of benzodiazepine, intentional self-harm, initial encounter ([place]) 01/26/2020  . MDD (major depressive disorder), recurrent severe, without psychosis ([place]) 01/26/2020  . Drug overdose 01/25/2020  . Onychomycosis 01/05/2019  . Neuropathy 01/05/2019  . Severe episode of recurrent major depressive disorder, without psychotic features ([place]) 10/04/2018  . OSA (obstructive sleep apnea) 09/07/2018  . Bilateral hip pain 10/24/2017  . Shoulder pain, bilateral 10/24/2017  . Arthritis 10/24/2017  . Osteoarthritis of both hips 10/24/2017  . Avascular necrosis of bone of hip, right ([place]) 10/24/2017  . 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 PTSD (post-traumatic stress disorder) 08/20/2017  . Tardive dyskinesia 08/20/2017  . Severe major depression ([place]) 05/10/2017  . Affective psychosis, bipolar ([place]) 05/10/2017  . Vision disturbance 05/06/2017  . History of concussion 02/10/2017  . Mood disorder ([place]) 02/10/2017  . Adjustment insomnia 01/24/2017  . Adjustment disorder with anxious mood 01/24/2017  . GAD (generalized anxiety disorder) 01/24/2017  . Gastroesophageal  reflux disease without esophagitis 11/19/2016  . Hypothyroidism 11/19/2016  . DDD (degenerative disc disease), lumbar 11/19/2016    Cottrell Gentles, [place] MPT 10/04/2020, 2:25 PM  [facility] [address] [place], [place], [address] Phone: [phone]   Fax:  [phone]  Name: Breydan [place]illing[place] MRN: [id number] Date of Birth: August 21, 1971

## 2020-10-09 ENCOUNTER — Ambulatory Visit: Payer: BC Managed Care – PPO | Admitting: Physical Therapy

## 2020-10-09 ENCOUNTER — Encounter: Payer: Self-pay | Admitting: Physical Therapy

## 2020-10-09 ENCOUNTER — Other Ambulatory Visit: Payer: Self-pay

## 2020-10-09 DIAGNOSIS — G8929 Other chronic pain: Secondary | ICD-10-CM | POA: Diagnosis not present

## 2020-10-09 DIAGNOSIS — M545 Low back pain, unspecified: Secondary | ICD-10-CM

## 2020-10-09 DIAGNOSIS — R293 Abnormal posture: Secondary | ICD-10-CM | POA: Diagnosis not present

## 2020-10-09 NOTE — Therapy (Signed)
[facility] [place], [place], [address] Phone: [phone]   Fax:  [phone]  Physical Therapy Treatment  Patient Details  Name: Marvin Espinoza MRN: [id number] Date of Birth: 1971-11-26 Referring Provider (PT): Ronnie Doss DO   Encounter Date: 10/09/2020   PT End of Session - 10/09/20 0748    Visit Number 2    Number of Visits 6    Date for PT Re-Evaluation 11/15/20    PT Start Time 0735    PT Stop Time 0818    PT Time Calculation (min) 43 min    Activity Tolerance Patient tolerated treatment well    Behavior During Therapy [facility] for tasks assessed/performed           Past Medical History:  Diagnosis Date  . Anxiety   . Bipolar disorder ([place])   . CTS (carpal tunnel syndrome)   . Depression   . GERD (gastroesophageal reflux disease)   . H/O bariatric surgery 2016  . Head injury 05/06/2017  . Hypothyroidism   . Hypothyroidism   . Sleep apnea    no longer with OSA d/t over 100 pd weight loss    Past Surgical History:  Procedure Laterality Date  . ANAL FISSURE REPAIR    . BARIATRIC SURGERY    . CARPAL TUNNEL RELEASE Left 05/09/2015   Procedure: LEFT CARPAL TUNNEL RELEASE;  Surgeon: Daryll Brod, MD;  Location: [place];  Service: Orthopedics;  Laterality: Left;  . CARPAL TUNNEL RELEASE Left 05-09-2015  . CARPAL TUNNEL RELEASE Right 06/20/2015   Procedure: RIGHT CARPAL TUNNEL RELEASE;  Surgeon: Daryll Brod, MD;  Location: [place];  Service: Orthopedics;  Laterality: Right;  REGIONAL/FAB  . CHOLECYSTECTOMY    . COLONOSCOPY    . UPPER GASTROINTESTINAL ENDOSCOPY      There were no vitals filed for this visit.   Subjective Assessment - 10/09/20 0747    Subjective COVID 19 screening performed on patient upon arrival. Patient reports his back is so so today. Pain did present in B hips this weekend which is typically after standing for prolonged period of time and if he sits in a  certain position.    Pertinent History Bi-polar, CTR, h/o bilateral shoulder pain, head injury, hypothyroidism.    How long can you sit comfortably? Varies.    Patient Stated Goals Reduce pain.    Currently in Pain? Yes    Pain Score 6     Pain Location Back    Pain Orientation Lower    Pain Descriptors / Indicators Aching;Throbbing    Pain Type Chronic pain    Pain Onset More than a month ago    Pain Frequency Constant              [facility] PT Assessment - 10/09/20 0001      Assessment   Medical Diagnosis Lumbar DDD.    Referring Provider (PT) Ronnie Doss DO    Next MD Visit 12/23/2019      Precautions   Precautions None      Restrictions   Weight Bearing Restrictions No                         [facility] Adult PT Treatment/Exercise - 10/09/20 0001      Exercises   Exercises Lumbar      Lumbar Exercises: Aerobic   Nustep L4 x16 min      Lumbar Exercises: Standing   Row Strengthening;Both;20  reps;Limitations    Row Limitations Blue XTS    Shoulder Extension Strengthening;Both;20 reps;Limitations    Shoulder Extension Limitations Blue XTS    Other Standing Lumbar Exercises B chop wood Blue XTS x20 reps    Other Standing Lumbar Exercises wall pushups x20 reps      Lumbar Exercises: Supine   Bridge 20 reps;3 seconds      Modalities   Modalities Electrical Stimulation;Moist Heat      Moist Heat Therapy   Number Minutes Moist Heat 15 Minutes    Moist Heat Location Lumbar Spine      Electrical Stimulation   Electrical Stimulation Location B low back    Electrical Stimulation Action IFC    Electrical Stimulation Parameters 80-150 hz x15 min    Electrical Stimulation Goals Pain                       PT Long Term Goals - 10/04/20 1419      PT LONG TERM GOAL #1   Title Independent with a HEP.    Time 6    Period Weeks    Status New      PT LONG TERM GOAL #2   Title Sit 30 minutes with pain not > 3/10.    Time 6    Period Weeks     Status New      PT LONG TERM GOAL #3   Title Perform ADL's with pain not > 4/10.    Time 6    Period Weeks    Status New                 Plan - 10/09/20 0831    Clinical Impression Statement Patient presented in clinic with reports of mid to high level LBP which he says is exaggerated by prolonged standing or sitting in certain positions. Patient progressed through resisted and stability exercises for lumbar spine and core with no complaints of any increased LBP. Normal modalities response noted following removal of the modalities.    Personal Factors and Comorbidities Comorbidity 1;Comorbidity 2;Comorbidity 3+    Comorbidities Bi-polar, CTR, h/o bilateral shoulder pain, head injury, hypothyroidism.    Examination-Activity Limitations Sit;Locomotion Level    Examination-Participation Restrictions Other    Stability/Clinical Decision Making Evolving/Moderate complexity    Rehab Potential Good    PT Frequency 1x / week    PT Duration 6 weeks    PT Treatment/Interventions ADLs/Self Care Home Management;Cryotherapy;Electrical Stimulation;Traction;Moist Heat;Therapeutic activities;Manual techniques;Patient/family education;Passive range of motion;Dry needling;Spinal Manipulations;Joint Manipulations    PT Next Visit Plan Core exercise progression.  Modalites and STW/M as needed.Marland Kitchen    Consulted and Agree with Plan of Care Patient           Patient will benefit from skilled therapeutic intervention in order to improve the following deficits and impairments:  Pain, Decreased activity tolerance, Postural dysfunction, Decreased range of motion  Visit Diagnosis: Chronic bilateral low back pain without sciatica  Abnormal posture     Problem List Patient Active Problem List   Diagnosis Date Noted  . History of gastric surgery 03/21/2020  . Overdose of benzodiazepine, intentional self-harm, initial encounter ([place]) 01/26/2020  . MDD (major depressive disorder), recurrent severe,  without psychosis ([place]) 01/26/2020  . Drug overdose 01/25/2020  . Onychomycosis 01/05/2019  . Neuropathy 01/05/2019  . Severe episode of recurrent major depressive disorder, without psychotic features ([place]) 10/04/2018  . OSA (obstructive sleep apnea) 09/07/2018  . Bilateral hip pain 10/24/2017  .  Shoulder pain, bilateral 10/24/2017  . Arthritis 10/24/2017  . Osteoarthritis of both hips 10/24/2017  . Avascular necrosis of bone of hip, right ([place]) 10/24/2017  . 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 PTSD (post-traumatic stress disorder) 08/20/2017  . Tardive dyskinesia 08/20/2017  . Severe major depression ([place]) 05/10/2017  . Affective psychosis, bipolar ([place]) 05/10/2017  . Vision disturbance 05/06/2017  . History of concussion 02/10/2017  . Mood disorder ([place]) 02/10/2017  . Adjustment insomnia 01/24/2017  . Adjustment disorder with anxious mood 01/24/2017  . GAD (generalized anxiety disorder) 01/24/2017  . Gastroesophageal reflux disease without esophagitis 11/19/2016  . Hypothyroidism 11/19/2016  . DDD (degenerative disc disease), lumbar 11/19/2016    Standley Brooking, PTA 10/09/2020, 8:34 AM  [facility] [address] [place], [place], [address] Phone: [phone]   Fax:  [phone]  Name: Marvin Espinoza MRN: [id number] Date of Birth: 11-08-1971

## 2020-10-13 DIAGNOSIS — F3112 Bipolar disorder, current episode manic without psychotic features, moderate: Secondary | ICD-10-CM | POA: Diagnosis not present

## 2020-10-13 DIAGNOSIS — F3132 Bipolar disorder, current episode depressed, moderate: Secondary | ICD-10-CM | POA: Diagnosis not present

## 2020-10-16 ENCOUNTER — Other Ambulatory Visit: Payer: Self-pay

## 2020-10-16 ENCOUNTER — Ambulatory Visit: Payer: BC Managed Care – PPO | Attending: Family Medicine | Admitting: Physical Therapy

## 2020-10-16 ENCOUNTER — Encounter: Payer: Self-pay | Admitting: Physical Therapy

## 2020-10-16 DIAGNOSIS — G8929 Other chronic pain: Secondary | ICD-10-CM | POA: Insufficient documentation

## 2020-10-16 DIAGNOSIS — R293 Abnormal posture: Secondary | ICD-10-CM | POA: Diagnosis not present

## 2020-10-16 DIAGNOSIS — M545 Low back pain, unspecified: Secondary | ICD-10-CM | POA: Diagnosis not present

## 2020-10-16 NOTE — Therapy (Signed)
[facility] [place], [place], [address] Phone: [phone]   Fax:  [phone]  Physical Therapy Treatment  Patient Details  Name: Marvin Espinoza MRN: [id number] Date of Birth: [birth year] Referring Provider (PT): Ronnie Doss DO   Encounter Date: 10/16/2020   PT End of Session - 10/16/20 0747    Visit Number 3    Number of Visits 6    Date for PT Re-Evaluation 11/15/20    PT Start Time 0737    PT Stop Time 0820    PT Time Calculation (min) 43 min    Activity Tolerance Patient tolerated treatment well    Behavior During Therapy Flat affect           Past Medical History:  Diagnosis Date  . Anxiety   . Bipolar disorder ([place])   . CTS (carpal tunnel syndrome)   . Depression   . GERD (gastroesophageal reflux disease)   . H/O bariatric surgery 2016  . Head injury 05/06/2017  . Hypothyroidism   . Hypothyroidism   . Sleep apnea    no longer with OSA d/t over 100 pd weight loss    Past Surgical History:  Procedure Laterality Date  . ANAL FISSURE REPAIR    . BARIATRIC SURGERY    . CARPAL TUNNEL RELEASE Left 05/09/2015   Procedure: LEFT CARPAL TUNNEL RELEASE;  Surgeon: Daryll Brod, MD;  Location: [place];  Service: Orthopedics;  Laterality: Left;  . CARPAL TUNNEL RELEASE Left 05-09-2015  . CARPAL TUNNEL RELEASE Right 06/20/2015   Procedure: RIGHT CARPAL TUNNEL RELEASE;  Surgeon: Daryll Brod, MD;  Location: [place];  Service: Orthopedics;  Laterality: Right;  REGIONAL/FAB  . CHOLECYSTECTOMY    . COLONOSCOPY    . UPPER GASTROINTESTINAL ENDOSCOPY      There were no vitals filed for this visit.   Subjective Assessment - 10/16/20 0746    Subjective COVID 19 screening performed on patient upon arrival. No particular pain upon arrival this morning.    Pertinent History Bi-polar, CTR, h/o bilateral shoulder pain, head injury, hypothyroidism.    How long can you sit comfortably? Varies.     Patient Stated Goals Reduce pain.    Currently in Pain? No/denies              [facility] PT Assessment - 10/16/20 0001      Assessment   Medical Diagnosis Lumbar DDD.    Referring Provider (PT) Ronnie Doss DO    Next MD Visit 12/23/2019      Precautions   Precautions None      Restrictions   Weight Bearing Restrictions No                         [facility] Adult PT Treatment/Exercise - 10/16/20 0001      Lumbar Exercises: Aerobic   Nustep L5 x16 min      Lumbar Exercises: Standing   Row Strengthening;Both;20 reps;Limitations    Row Limitations Blue XTS    Shoulder Extension Strengthening;Both;20 reps;Limitations    Shoulder Extension Limitations Blue XTS      Lumbar Exercises: Supine   Clam 20 reps;2 seconds    Clam Limitations red theraband    Bent Knee Raise 20 reps;2 seconds    Bent Knee Raise Limitations red theraband    Bridge 20 reps;3 seconds    Straight Leg Raise 20 reps    Straight Leg Raises Limitations BLE  Modalities   Modalities Teacher, English as a foreign language Location B low back    Electrical Stimulation Action IFC    Electrical Stimulation Parameters 80-150 hz x10 min    Electrical Stimulation Goals Pain                       PT Long Term Goals - 10/04/20 1419      PT LONG TERM GOAL #1   Title Independent with a HEP.    Time 6    Period Weeks    Status New      PT LONG TERM GOAL #2   Title Sit 30 minutes with pain not > 3/10.    Time 6    Period Weeks    Status New      PT LONG TERM GOAL #3   Title Perform ADL's with pain not > 4/10.    Time 6    Period Weeks    Status New                 Plan - 10/16/20 1017    Clinical Impression Statement Patient presented in clinic with no current LBP but did report discomfort over the weekend after mowing his yard. Patient progressed through therex for core activation and functional strength without complaint  of any acute pain. Monitored for proper technique throught therex session and instructed for any compensations. Patient did report lumbar soreness by end of therex session. Normal stimulation response noted following removal of the modality.    Personal Factors and Comorbidities Comorbidity 1;Comorbidity 2;Comorbidity 3+    Comorbidities Bi-polar, CTR, h/o bilateral shoulder pain, head injury, hypothyroidism.    Examination-Activity Limitations Sit;Locomotion Level    Examination-Participation Restrictions Other    Stability/Clinical Decision Making Evolving/Moderate complexity    Rehab Potential Good    PT Frequency 1x / week    PT Duration 6 weeks    PT Treatment/Interventions ADLs/Self Care Home Management;Cryotherapy;Electrical Stimulation;Traction;Moist Heat;Therapeutic activities;Manual techniques;Patient/family education;Passive range of motion;Dry needling;Spinal Manipulations;Joint Manipulations    PT Next Visit Plan Core exercise progression.  Modalites and STW/M as needed.Marland Kitchen    Consulted and Agree with Plan of Care Patient           Patient will benefit from skilled therapeutic intervention in order to improve the following deficits and impairments:  Pain, Decreased activity tolerance, Postural dysfunction, Decreased range of motion  Visit Diagnosis: Chronic bilateral low back pain without sciatica  Abnormal posture     Problem List Patient Active Problem List   Diagnosis Date Noted  . History of gastric surgery 03/21/2020  . Overdose of benzodiazepine, intentional self-harm, initial encounter ([place]) 01/26/2020  . MDD (major depressive disorder), recurrent severe, without psychosis ([place]) 01/26/2020  . Drug overdose 01/25/2020  . Onychomycosis 01/05/2019  . Neuropathy 01/05/2019  . Severe episode of recurrent major depressive disorder, without psychotic features ([place]) 10/04/2018  . OSA (obstructive sleep apnea) 09/07/2018  . Bilateral hip pain 10/24/2017  . Shoulder  pain, bilateral 10/24/2017  . Arthritis 10/24/2017  . Osteoarthritis of both hips 10/24/2017  . Avascular necrosis of bone of hip, right ([place]) 10/24/2017  . 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 PTSD (post-traumatic stress disorder) 08/20/2017  . Tardive dyskinesia 08/20/2017  . Severe major depression ([place]) 05/10/2017  . Affective psychosis, bipolar ([place]) 05/10/2017  . Vision disturbance 05/06/2017  . History of concussion 02/10/2017  . Mood disorder ([place]) 02/10/2017  . Adjustment insomnia  01/24/2017  . Adjustment disorder with anxious mood 01/24/2017  . GAD (generalized anxiety disorder) 01/24/2017  . Gastroesophageal reflux disease without esophagitis 11/19/2016  . Hypothyroidism 11/19/2016  . DDD (degenerative disc disease), lumbar 11/19/2016    Standley Brooking, PTA 10/16/2020, 8:29 AM  [facility] [place], [place], [address] Phone: [phone]   Fax:  [phone]  Name: Forrester Blando MRN: [id number] Date of Birth: 1971/09/17

## 2020-10-23 ENCOUNTER — Ambulatory Visit: Payer: BC Managed Care – PPO | Admitting: Physical Therapy

## 2020-10-23 ENCOUNTER — Other Ambulatory Visit: Payer: Self-pay

## 2020-10-23 DIAGNOSIS — G8929 Other chronic pain: Secondary | ICD-10-CM | POA: Diagnosis not present

## 2020-10-23 DIAGNOSIS — M545 Low back pain, unspecified: Secondary | ICD-10-CM | POA: Diagnosis not present

## 2020-10-23 DIAGNOSIS — R293 Abnormal posture: Secondary | ICD-10-CM | POA: Diagnosis not present

## 2020-10-23 NOTE — Patient Instructions (Addendum)
ELASTIC BAND SHOULDER DIAGONAL  EXT - ABD  While holding an elastic band across the upper half of your body, pull the band downward and across towards your other side. Your hand should start in the thumb-up position and end in the thumb-down position.  Can use both hands together with core activation on both sides   Standing lat pull with theraband  Anchor bands higher than your head. Start with your arms straight out in front of you at shoulder height (or a little above).  Pull bands down next to your body and then slowly return to the starting position.  10-30 x1day    Bridging with Theraband  Begin by lying on your back with your knees bent and theraband around your knees. Tighten your abs by tilting your pelvis up and flattening your back on the mat. Squeeze your glutes and raise your hips off of the table towards the ceiling. Keeping your hips raised, pull your knees outward against the band. Relax your knees back to midline and slowly return your hips to the mat. Relax and Breathe 2x10 1x daily   Bent Leg Lift (Hook-Lying)  Tighten stomach and slowly raise right leg _5___ inches from floor. Keep trunk rigid. Hold _3___ seconds. Repeat _10___ times per set. Do ___2-3_ sets per session. Do __2__ sessions per day.   Straight Leg Raise  Tighten stomach and slowly raise locked right leg __4__ inches from floor. Repeat __10-30__ times per set. Do __2__ sets per session. Do __2__ sessions per day.    Scapular Retraction: Bilateral  Facing anchor, pull arms back, bringing shoulder blades together. Repeat _30___ times per set. Do __2-3__ sets per session. Do _2___ sessions per day.     Brushing Teeth    Place one foot on ledge and one hand on counter. Bend other knee slightly to keep back straight.  Copyright  VHI. All rights reserved.  Refrigerator   Squat with knees apart to reach lower shelves and drawers.   Copyright  VHI. All rights reserved.  Laundry  [employer] down and hold basket close to stand. Use leg muscles to do the work.   Copyright  VHI. All rights reserved.  Housework - Vacuuming   Hold the vacuum with arm held at side. Step back and forth to move it, keeping head up. Avoid twisting.   Copyright  VHI. All rights reserved.  Housework - Wiping   Position yourself as close as possible to reach work surface. Avoid straining your back.   Copyright  VHI. All rights reserved.  Gardening - Mowing   Keep arms close to sides and walk with lawn mower.   Copyright  VHI. All rights reserved.  Sleeping on Side   Place pillow between knees. Use cervical support under neck and a roll around waist as needed.   Copyright  VHI. All rights reserved.  Log Roll   Lying on back, bend left knee and place left arm across chest. Roll all in one movement to the right. Reverse to roll to the left. Always move as one unit.   Copyright  VHI. All rights reserved.  Stand to Sit / Sit to Stand   To sit: Bend knees to lower self onto front edge of chair, then scoot back on seat. To stand: Reverse sequence by placing one foot forward, and scoot to front of seat. Use rocking motion to stand up.  Copyright  VHI. All rights reserved.  Posture - Standing   Good posture  is important. Avoid slouching and forward head thrust. Maintain curve in low back and align ears over shoul- ders, hips over ankles.   Copyright  VHI. All rights reserved.  Posture - Sitting   Sit upright, head facing forward. Try using a roll to support lower back. Keep shoulders relaxed, and avoid rounded back. Keep hips level with knees. Avoid crossing legs for long periods.   Copyright  VHI. All rights reserved.  Computer Work   Position work to Programmer, multimedia. Use proper work and seat height. Keep shoulders back and down, wrists straight, and elbows at right angles. Use chair that provides full back support. Add footrest and lumbar roll as  needed.   Copyright  VHI. All rights reserved.

## 2020-10-23 NOTE — Therapy (Signed)
[facility] [place], [place], [address] Phone: [phone]   Fax:  [phone]  Physical Therapy Treatment  Patient Details  Name: Marvin Espinoza MRN: [id number] Date of Birth: 18-Sep-1971 Referring Provider (PT): Ronnie Doss DO   Encounter Date: 10/23/2020   PT End of Session - 10/23/20 0747    Visit Number 4    Number of Visits 6    Date for PT Re-Evaluation 11/15/20    PT Start Time 0733    PT Stop Time 0813    PT Time Calculation (min) 40 min    Activity Tolerance Patient tolerated treatment well    Behavior During Therapy [facility] for tasks assessed/performed           Past Medical History:  Diagnosis Date  . Anxiety   . Bipolar disorder ([place])   . CTS (carpal tunnel syndrome)   . Depression   . GERD (gastroesophageal reflux disease)   . H/O bariatric surgery 2016  . Head injury 05/06/2017  . Hypothyroidism   . Hypothyroidism   . Sleep apnea    no longer with OSA d/t over 100 pd weight loss    Past Surgical History:  Procedure Laterality Date  . ANAL FISSURE REPAIR    . BARIATRIC SURGERY    . CARPAL TUNNEL RELEASE Left 05/09/2015   Procedure: LEFT CARPAL TUNNEL RELEASE;  Surgeon: Daryll Brod, MD;  Location: [place];  Service: Orthopedics;  Laterality: Left;  . CARPAL TUNNEL RELEASE Left 05-09-2015  . CARPAL TUNNEL RELEASE Right 06/20/2015   Procedure: RIGHT CARPAL TUNNEL RELEASE;  Surgeon: Daryll Brod, MD;  Location: [place];  Service: Orthopedics;  Laterality: Right;  REGIONAL/FAB  . CHOLECYSTECTOMY    . COLONOSCOPY    . UPPER GASTROINTESTINAL ENDOSCOPY      There were no vitals filed for this visit.   Subjective Assessment - 10/23/20 0746    Subjective COVID 19 screening performed on patient upon arrival. No pain upon arrival yet pain with prolong standing    Pertinent History Bi-polar, CTR, h/o bilateral shoulder pain, head injury, hypothyroidism.    How long can  you sit comfortably? Varies.    Patient Stated Goals Reduce pain.    Currently in Pain? No/denies                             [facility] Adult PT Treatment/Exercise - 10/23/20 0001      Lumbar Exercises: Aerobic   Nustep L5 x16 min UE/LE acivity core focus      Lumbar Exercises: Standing   Row Strengthening;Both;20 reps;Limitations    Row Limitations Blue XTS    Shoulder Extension Strengthening;Both;20 reps;Limitations    Shoulder Extension Limitations Blue XTS    Other Standing Lumbar Exercises B chop wood Blue XTS x20 reps      Lumbar Exercises: Supine   Bridge with clamshell 20 reps   green band   Straight Leg Raise 3 seconds   2x10 core focus                 PT Education - 10/23/20 0805    Education Details HEP progression and posture awareness techniques    Person(s) Educated Patient    Methods Explanation;Demonstration;Handout    Comprehension Verbalized understanding;Returned demonstration               PT Long Term Goals - 10/23/20 0748      PT LONG  TERM GOAL #1   Title Independent with a HEP.    Baseline Met 10/23/20    Time 6    Period Weeks    Status On-going      PT LONG TERM GOAL #2   Title Sit 30 minutes with pain not > 3/10.    Baseline 6/10 pain reported 10/23/20    Time 6    Period Weeks    Status On-going      PT LONG TERM GOAL #3   Title Perform ADL's with pain not > 4/10.    Baseline 6-7/10 per reported 10/23/20    Time 6    Period Weeks    Status On-going                 Plan - 10/23/20 0813    Clinical Impression Statement Patient tolerated treatment well today. No pain upon arrival and able to progress with core strengthening. Educated on posture awareness techniques and HEP issued for core progression with green t-band. Patient has incresed pain with ADL's and any prolong position over a 6/10. Patient met LTG #1 with others ongoing. No modalities needed.    Personal Factors and Comorbidities Comorbidity  1;Comorbidity 2;Comorbidity 3+    Comorbidities Bi-polar, CTR, h/o bilateral shoulder pain, head injury, hypothyroidism.    Examination-Activity Limitations Sit;Locomotion Level    Examination-Participation Restrictions Other    Stability/Clinical Decision Making Evolving/Moderate complexity    Rehab Potential Good    PT Frequency 1x / week    PT Duration 6 weeks    PT Treatment/Interventions ADLs/Self Care Home Management;Cryotherapy;Electrical Stimulation;Traction;Moist Heat;Therapeutic activities;Manual techniques;Patient/family education;Passive range of motion;Dry needling;Spinal Manipulations;Joint Manipulations    PT Next Visit Plan Core exercise progression.  Modalites and STW/M as needed.Marland Kitchen    Consulted and Agree with Plan of Care Patient           Patient will benefit from skilled therapeutic intervention in order to improve the following deficits and impairments:  Pain, Decreased activity tolerance, Postural dysfunction, Decreased range of motion  Visit Diagnosis: Chronic bilateral low back pain without sciatica  Abnormal posture     Problem List Patient Active Problem List   Diagnosis Date Noted  . History of gastric surgery 03/21/2020  . Overdose of benzodiazepine, intentional self-harm, initial encounter ([employer]) 01/26/2020  . MDD (major depressive disorder), recurrent severe, without psychosis ([place]) 01/26/2020  . Drug overdose 01/25/2020  . Onychomycosis 01/05/2019  . Neuropathy 01/05/2019  . Severe episode of recurrent major depressive disorder, without psychotic features ([place]) 10/04/2018  . OSA (obstructive sleep apnea) 09/07/2018  . Bilateral hip pain 10/24/2017  . Shoulder pain, bilateral 10/24/2017  . Arthritis 10/24/2017  . Osteoarthritis of both hips 10/24/2017  . Avascular necrosis of bone of hip, right ([place]) 10/24/2017  . 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 PTSD (post-traumatic stress disorder) 08/20/2017  . Tardive dyskinesia 08/20/2017  . Severe  major depression ([place]) 05/10/2017  . Affective psychosis, bipolar ([place]) 05/10/2017  . Vision disturbance 05/06/2017  . History of concussion 02/10/2017  . Mood disorder ([place]) 02/10/2017  . Adjustment insomnia 01/24/2017  . Adjustment disorder with anxious mood 01/24/2017  . GAD (generalized anxiety disorder) 01/24/2017  . Gastroesophageal reflux disease without esophagitis 11/19/2016  . Hypothyroidism 11/19/2016  . DDD (degenerative disc disease), lumbar 11/19/2016    Marilyn Wing P, PTA 10/23/2020, 8:23 AM  [facility] [place], [place], [address] Phone: [phone]   Fax:  [phone]  Name: Marvin Espinoza MRN: [id number]  Date of Birth: Jun 29, 1971

## 2020-10-27 ENCOUNTER — Emergency Department (HOSPITAL_COMMUNITY): Payer: BC Managed Care – PPO

## 2020-10-27 ENCOUNTER — Encounter (HOSPITAL_COMMUNITY): Payer: Self-pay

## 2020-10-27 ENCOUNTER — Emergency Department (HOSPITAL_COMMUNITY)
Admission: EM | Admit: 2020-10-27 | Discharge: 2020-10-27 | Disposition: A | Discharge status: Home / Self Care | Payer: BC Managed Care – PPO | Attending: Emergency Medicine | Admitting: Emergency Medicine

## 2020-10-27 ENCOUNTER — Other Ambulatory Visit: Payer: Self-pay

## 2020-10-27 DIAGNOSIS — K5732 Diverticulitis of large intestine without perforation or abscess without bleeding: Secondary | ICD-10-CM | POA: Diagnosis not present

## 2020-10-27 DIAGNOSIS — Z79899 Other long term (current) drug therapy: Secondary | ICD-10-CM | POA: Diagnosis not present

## 2020-10-27 DIAGNOSIS — Z7984 Long term (current) use of oral hypoglycemic drugs: Secondary | ICD-10-CM | POA: Insufficient documentation

## 2020-10-27 DIAGNOSIS — E039 Hypothyroidism, unspecified: Secondary | ICD-10-CM | POA: Insufficient documentation

## 2020-10-27 DIAGNOSIS — K5792 Diverticulitis of intestine, part unspecified, without perforation or abscess without bleeding: Secondary | ICD-10-CM | POA: Diagnosis not present

## 2020-10-27 DIAGNOSIS — R109 Unspecified abdominal pain: Secondary | ICD-10-CM | POA: Diagnosis not present

## 2020-10-27 DIAGNOSIS — R1030 Lower abdominal pain, unspecified: Secondary | ICD-10-CM | POA: Diagnosis not present

## 2020-10-27 DIAGNOSIS — E119 Type 2 diabetes mellitus without complications: Secondary | ICD-10-CM | POA: Diagnosis not present

## 2020-10-27 LAB — COMPREHENSIVE METABOLIC PANEL
ALT: 21 U/L (ref 0–44)
AST: 17 U/L (ref 15–41)
Albumin: 4.4 g/dL (ref 3.5–5.0)
Alkaline Phosphatase: 64 U/L (ref 38–126)
Anion gap: 10 (ref 5–15)
BUN: 14 mg/dL (ref 6–20)
CO2: 28 mmol/L (ref 22–32)
Calcium: 9.7 mg/dL (ref 8.9–10.3)
Chloride: 100 mmol/L (ref 98–111)
Creatinine, Ser: 1.11 mg/dL (ref 0.61–1.24)
GFR, Estimated: >60 mL/min (ref >60)
Glucose, Bld: 127 mg/dL — ABNORMAL HIGH (ref 70–99)
Potassium: 4.4 mmol/L (ref 3.5–5.1)
Sodium: 138 mmol/L (ref 135–145)
Total Bilirubin: 0.9 mg/dL (ref 0.3–1.2)
Total Protein: 7.8 g/dL (ref 6.5–8.1)

## 2020-10-27 LAB — CBC
HCT: 43.8 % (ref 39.0–52.0)
Hemoglobin: 14 g/dL (ref 13.0–17.0)
MCH: 28.7 pg (ref 26.0–34.0)
MCHC: 32 g/dL (ref 30.0–36.0)
MCV: 89.8 fL (ref 80.0–100.0)
Platelets: 275 10*3/uL (ref 150–400)
RBC: 4.88 MIL/uL (ref 4.22–5.81)
RDW: 13.2 % (ref 11.5–15.5)
WBC: 11.6 10*3/uL — ABNORMAL HIGH (ref 4.0–10.5)
nRBC: 0 % (ref 0.0–0.2)

## 2020-10-27 LAB — URINALYSIS, ROUTINE W REFLEX MICROSCOPIC
Bilirubin Urine: NEGATIVE
Glucose, UA: NEGATIVE mg/dL
Hgb urine dipstick: NEGATIVE
Ketones, ur: NEGATIVE mg/dL
Leukocytes,Ua: NEGATIVE
Nitrite: NEGATIVE
Protein, ur: NEGATIVE mg/dL
Specific Gravity, Urine: 1.013 (ref 1.005–1.030)
pH: 6 (ref 5.0–8.0)

## 2020-10-27 LAB — LIPASE, BLOOD: Lipase: 25 U/L (ref 11–51)

## 2020-10-27 MED ORDER — ONDANSETRON 4 MG PO TBDP: 4.0000 mg | ORAL_TABLET | Freq: Three times a day (TID) | ORAL | 0 refills | Status: DC | PRN

## 2020-10-27 MED ORDER — AMOXICILLIN-POT CLAVULANATE 875-125 MG PO TABS: 1.0000 | ORAL_TABLET | Freq: Three times a day (TID) | ORAL | 0 refills | Status: AC

## 2020-10-27 MED ORDER — AMOXICILLIN-POT CLAVULANATE 875-125 MG PO TABS
1.0000 | ORAL_TABLET | Freq: Once | ORAL | Status: AC
  Administered 2020-10-27: 1 via ORAL
  Filled 2020-10-27: qty 1

## 2020-10-27 MED ORDER — IOHEXOL 300 MG/ML  SOLN
100.0000 mL | Freq: Once | INTRAMUSCULAR | Status: AC | PRN
  Administered 2020-10-27: 100 mL via INTRAVENOUS

## 2020-10-27 NOTE — ED Provider Notes (Signed)
[facility] EMERGENCY DEPARTMENT Provider Note   CSN: [id number] Arrival date & time: 10/27/20  0730     History Chief Complaint  Patient presents with  . Abdominal Pain    Marvin Espinoza is a 49 y.o. male with past medical history significant for anxiety, bipolar disorder, diabetes, GERD, bariatric surgery in 2016 with Dr. Florene Glen, hypothyroid.  Abdominal surgical history also includes cholecystectomy.  HPI Patient presents to emergency department today with chief complaint of persistent left lower abdominal pain x1 day.  He states his pain started 12 PM yesterday afternoon.  Pain is located in his left lower quadrant and does not radiate.  He describes the pain as throbbing sensation.  He states the pain is worse when taking a breath.  He rates the pain 8 out of 10 in severity.  He did not take any medication for symptoms prior to arrival.  He does admit to history of constipation ever since he had his gastric bypass.  He takes Metamucil, stool softener and eats an avocado daily to assist with bowel movements.  His last one was this morning.  He states it was normal, denies any diarrhea or blood in stool.  He admits to having similar pain in the past however that was attributed to gas and resolved spontaneously.  He tried calling his surgeon's office however was unable to schedule an appointment prompting him to come to the emergency department.  He denies any fever, chills, shortness of breath, chest pain, back pain, nausea, emesis, testicular or scrotal pain or swelling, gross hematuria, urinary frequency, dysuria. Denies history of kidney stones.      Past Medical History:  Diagnosis Date  . Anxiety   . Bipolar disorder ([place])   . CTS (carpal tunnel syndrome)   . Depression   . Diabetes mellitus without complication ([place])   . GERD (gastroesophageal reflux disease)   . H/O bariatric surgery 2016  . Head injury 05/06/2017  . Hypothyroidism   . Hypothyroidism   . Sleep apnea    no  longer with OSA d/t over 100 pd weight loss    Patient Active Problem List   Diagnosis Date Noted  . History of gastric surgery 03/21/2020  . Overdose of benzodiazepine, intentional self-harm, initial encounter ([place]) 01/26/2020  . MDD (major depressive disorder), recurrent severe, without psychosis ([employer]) 01/26/2020  . Drug overdose 01/25/2020  . Onychomycosis 01/05/2019  . Neuropathy 01/05/2019  . Severe episode of recurrent major depressive disorder, without psychotic features ([place]) 10/04/2018  . OSA (obstructive sleep apnea) 09/07/2018  . Bilateral hip pain 10/24/2017  . Shoulder pain, bilateral 10/24/2017  . Arthritis 10/24/2017  . Osteoarthritis of both hips 10/24/2017  . Avascular necrosis of bone of hip, right ([place]) 10/24/2017  . 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 PTSD (post-traumatic stress disorder) 08/20/2017  . Tardive dyskinesia 08/20/2017  . Severe major depression ([place]) 05/10/2017  . Affective psychosis, bipolar ([place]) 05/10/2017  . Vision disturbance 05/06/2017  . History of concussion 02/10/2017  . Mood disorder ([place]) 02/10/2017  . Adjustment insomnia 01/24/2017  . Adjustment disorder with anxious mood 01/24/2017  . GAD (generalized anxiety disorder) 01/24/2017  . Gastroesophageal reflux disease without esophagitis 11/19/2016  . Hypothyroidism 11/19/2016  . DDD (degenerative disc disease), lumbar 11/19/2016    Past Surgical History:  Procedure Laterality Date  . ANAL FISSURE REPAIR    . BARIATRIC SURGERY    . CARPAL TUNNEL RELEASE Left 05/09/2015   Procedure: LEFT CARPAL TUNNEL RELEASE;  Surgeon: Daryll Brod, MD;  Location: [place];  Service: Orthopedics;  Laterality: Left;  . CARPAL TUNNEL RELEASE Left 05-09-2015  . CARPAL TUNNEL RELEASE Right 06/20/2015   Procedure: RIGHT CARPAL TUNNEL RELEASE;  Surgeon: Daryll Brod, MD;  Location: [place];  Service: Orthopedics;  Laterality: Right;  REGIONAL/FAB  . CHOLECYSTECTOMY    .  COLONOSCOPY    . UPPER GASTROINTESTINAL ENDOSCOPY         Family History  Problem Relation Age of Onset  . Diabetes Mother   . Heart disease Mother   . Depression Mother   . Esophageal cancer Mother   . COPD Father   . Heart disease Father   . Kidney disease Father   . Mental illness Father   . Depression Father   . Mental illness Sister   . Depression Sister   . Esophageal cancer Maternal Uncle   . Colon cancer Neg Hx   . Rectal cancer Neg Hx   . Stomach cancer Neg Hx     Social History   Tobacco Use  . Smoking status: Never Smoker  . Smokeless tobacco: Never Used  Vaping Use  . Vaping Use: Never used  Substance Use Topics  . Alcohol use: No    Comment: severe allergy to alcohol!!  . Drug use: No    Home Medications Prior to Admission medications   Medication Sig Start Date End Date Taking? Authorizing Provider  ALPRAZolam Duanne Moron) 1 MG tablet 1 mg. Six times per day prn 01/31/20   [provider]  amoxicillin-clavulanate (AUGMENTIN) 875-125 MG tablet Take 1 tablet by mouth every 8 (eight) hours for 10 days. 10/27/20 11/06/20  Barrie Folk, PA-C  antiseptic oral rinse (BIOTENE) LIQD 15 mLs by Mouth Rinse route as needed for dry mouth. 01/29/20   Lindell Spar I, NP  Asenapine Maleate 10 MG SUBL Place 20 mg under the tongue at bedtime.    [provider]  baclofen (LIORESAL) 10 MG tablet Take 0.5-1 tablets (5-10 mg total) by mouth 3 (three) times daily as needed for muscle spasms. 09/27/20   Janora Norlander, DO  Blood Glucose Monitoring Suppl (ONETOUCH VERIO) w/Device KIT Use to test blood sugar daily as directed 06/26/20   Janora Norlander, DO  Calcium-Vitamin D-Vitamin K (SM CALCIUM SOFT CHEWS PO) Take 1 application by mouth 2 (two) times daily. Chew calcium tabs twice a day.  (bariatric surgery)    [provider]  Cyanocobalamin (VITAMIN B-12) 5000 MCG TBDP Take 1 tablet by mouth daily.    [provider]  docusate  sodium (EQUATE STOOL SOFTENER) 100 MG capsule Take 100 mg by mouth 2 (two) times daily.    [provider]  glucose blood test strip Use to test blood sugar daily as directed 06/26/20   Janora Norlander, DO  lamoTRIgine (LAMICTAL) 200 MG tablet Take 400 mg by mouth at bedtime.     [provider]  levothyroxine (SYNTHROID) 137 MCG tablet Take 1 tablet (137 mcg total) by mouth daily before breakfast. Please DC the 1104mg 06/21/20   GRonnie DossM, DO  metFORMIN (GLUCOPHAGE) 500 MG tablet TAKE ONE TABLET TWICE A DAY WITH MEALS. 09/15/20   GJanora Norlander DO  Multiple Vitamin (MULTIVITAMIN WITH MINERALS) TABS tablet Take 1 tablet by mouth 2 (two) times daily. For Vitamin supplementation Patient taking differently: Take 1 tablet by mouth 2 (two) times daily. For Vitamin supplementation FROM GASTRIC BYPASS 05/15/17   NEncarnacion Slates NP  ondansetron ([facility]  ODT) 4 MG disintegrating tablet Take 1 tablet (4 mg total) by mouth every 8 (eight) hours as needed for nausea or vomiting. 10/27/20   Walisiewicz, Harley Hallmark, PA-C  OneTouch Delica Lancets 14N MISC Use to test blood sugar daily as directed 06/26/20   Ronnie Doss M, DO  pantoprazole (PROTONIX) 40 MG tablet Take 1 tablet (40 mg total) by mouth 2 (two) times daily. Failed Nexium, Omeprazole, Prevacid OTC, Pepcid, Zantac 09/27/20   Ronnie Doss M, DO  promethazine (PHENERGAN) 12.5 MG tablet TAKE 1 TO 2 TABLETS EVERY 8 HOURS AS NEEDED FOR NAUSEA AND VOMITING 07/11/20   Ronnie Doss M, DO  psyllium (METAMUCIL) 58.6 % powder Take 1 packet by mouth 2 (two) times daily.    [provider]  vitamin B-12 (CYANOCOBALAMIN) 250 MCG tablet Take 1 tablet (250 mcg total) by mouth daily. For low B-12 supplementation 01/29/20   Lindell Spar I, NP    Allergies    Abilify [aripiprazole], Seroquel [quetiapine fumarate], Alcohol, Ambien [zolpidem], Anacin-3 [acetaminophen], Cariprazine, Cariprazine hcl, Latuda [lurasidone hcl],  Lurasidone, Prozac [fluoxetine], Victoza [liraglutide], Bupropion, Hydrocodone-acetaminophen, Risperidone, and Tegretol [carbamazepine]  Review of Systems   Review of Systems  All other systems are reviewed and are negative for acute change except as noted in the HPI.   Physical Exam Updated Vital Signs BP 125/79   Pulse 64   Temp 98.6 F (37 C) (Oral)   Resp 18   Ht '5\' 10"'  (1.778 m)   Wt 97.5 kg   SpO2 99%   BMI 30.85 kg/m   Physical Exam Vitals and nursing note reviewed.  Constitutional:      General: He is not in acute distress.    Appearance: He is not ill-appearing.  HENT:     Head: Normocephalic and atraumatic.     Right Ear: Tympanic membrane and external ear normal.     Left Ear: Tympanic membrane and external ear normal.     Nose: Nose normal.     Mouth/Throat:     Mouth: Mucous membranes are moist.     Pharynx: Oropharynx is clear.  Eyes:     General: No scleral icterus.       Right eye: No discharge.        Left eye: No discharge.     Extraocular Movements: Extraocular movements intact.     Conjunctiva/sclera: Conjunctivae normal.     Pupils: Pupils are equal, round, and reactive to light.  Neck:     Vascular: No JVD.  Cardiovascular:     Rate and Rhythm: Normal rate and regular rhythm.     Pulses: Normal pulses.          Radial pulses are 2+ on the right side and 2+ on the left side.     Heart sounds: Normal heart sounds.  Pulmonary:     Comments: Lungs clear to auscultation in all fields. Symmetric chest rise. No wheezing, rales, or rhonchi. Abdominal:     Comments: Abdomen is soft, non-distended.  Tender to palpation in left lower quadrant. no rigidity, no guarding. No peritoneal signs.  Musculoskeletal:        General: Normal range of motion.     Cervical back: Normal range of motion.  Skin:    General: Skin is warm and dry.     Capillary Refill: Capillary refill takes less than 2 seconds.  Neurological:     Mental Status: He is oriented to  person, place, and time.     GCS: GCS  eye subscore is 4. GCS verbal subscore is 5. GCS motor subscore is 6.     Comments: Fluent speech, no facial droop.  Psychiatric:        Behavior: Behavior normal.     ED Results / Procedures / Treatments   Labs (all labs ordered are listed, but only abnormal results are displayed) Labs Reviewed  COMPREHENSIVE METABOLIC PANEL - Abnormal; Notable for the following components:      Result Value   Glucose, Bld 127 (*)    All other components within normal limits  CBC - Abnormal; Notable for the following components:   WBC 11.6 (*)    All other components within normal limits  LIPASE, BLOOD  URINALYSIS, ROUTINE W REFLEX MICROSCOPIC    EKG None  Radiology CT Abdomen Pelvis W Contrast  Result Date: 10/27/2020 CLINICAL DATA:  Left-sided abdominal pain for 2 days EXAM: CT ABDOMEN AND PELVIS WITH CONTRAST TECHNIQUE: Multidetector CT imaging of the abdomen and pelvis was performed using the standard protocol following bolus administration of intravenous contrast. CONTRAST:  137m OMNIPAQUE IOHEXOL 300 MG/ML  SOLN COMPARISON:  None. FINDINGS: Lower chest: No acute abnormality. Hepatobiliary: No focal liver abnormality is seen. Status post cholecystectomy. No biliary dilatation. Pancreas: Unremarkable. No pancreatic ductal dilatation or surrounding inflammatory changes. Spleen: Normal in size without focal abnormality. Adrenals/Urinary Tract: Adrenal glands are within normal limits. Kidneys demonstrate a normal enhancement pattern bilaterally. No obstructive changes are noted. No calculi are seen. Bladder is partially distended. Stomach/Bowel: There are changes consistent with diverticulitis in the mid descending colon. No perforation or abscess formation is noted. The remainder of the colon appears within normal limits. Patient gives a history of appendectomy although a normal-appearing appendix is noted. Small bowel and stomach are unremarkable. Changes of  prior bariatric surgery are seen. Vascular/Lymphatic: No significant vascular findings are present. No enlarged abdominal or pelvic lymph nodes. Reproductive: Prostate is unremarkable. Other: No abdominal wall hernia or abnormality. No abdominopelvic ascites. Musculoskeletal: No acute or significant osseous findings. IMPRESSION: Changes consistent with diverticulitis in the mid descending colon. No other focal abnormality is noted. Normal-appearing appendix is seen despite the patient's given clinical history of appendectomy. Clinical correlation is recommended. Electronically Signed   By: MInez CatalinaM.D.   On: 10/27/2020 11:32    Procedures Procedures (including critical care time)  Medications Ordered in ED Medications  iohexol (OMNIPAQUE) 300 MG/ML solution 100 mL (100 mLs Intravenous Contrast Given 10/27/20 1103)  amoxicillin-clavulanate (AUGMENTIN) 875-125 MG per tablet 1 tablet (1 tablet Oral Given 10/27/20 1216)    ED Course  I have reviewed the triage vital signs and the nursing notes.  Pertinent labs & imaging results that were available during my care of the patient were reviewed by me and considered in my medical decision making (see chart for details).    MDM Rules/Calculators/A&P                          History provided by patient with additional history obtained from chart review.    Patient presents to the ED with complaints of abdominal pain. Patient nontoxic appearing, in no apparent distress, vitals WNL . On exam patient tender to LLQ, no peritoneal signs. Will evaluate with labs and CT AP because of tenderness of exam. He declines need for analgesics or antiemetics. DDx includes diverticulitis, appendicitis, kidney stone, constipation, less likely SBO.  I viewed labs. Leukocytosis 11.6,  no anemia, no significant electrolyte derangements. LFTs, renal  function, and lipase WNL. Urinalysis without obvious infection. I viewed CT AP which shows acute uncomplicated  diverticulitis in mid descending colon.  On repeat abdominal exam patient remains without peritoneal signs. Patient tolerating PO in the emergency department. Will discharge home with Augmentin and discussed supportive measures.  Ciprofloxacin not indicated as he has an allergy to quetiapine furmarate.  I discussed results, treatment plan, need for PCP follow-up, and return precautions with the patient. Provided opportunity for questions, patient confirmed understanding and is in agreement with plan.    Portions of this note were generated with [employer]. Dictation errors may occur despite best attempts at proofreading.   Final Clinical Impression(s) / ED Diagnoses Final diagnoses:  Diverticulitis    Rx / DC Orders ED Discharge Orders         Ordered    amoxicillin-clavulanate (AUGMENTIN) 875-125 MG tablet  Every 8 hours        10/27/20 1212    ondansetron (ZOFRAN ODT) 4 MG disintegrating tablet  Every 8 hours PRN        10/27/20 1214           Barrie Folk, PA-C 10/27/20 1217    Daleen Bo, MD 10/27/20 2003

## 2020-10-27 NOTE — ED Triage Notes (Signed)
Pt reports left lower abdominal pain since yesterday approx 3 13m

## 2020-10-27 NOTE — Discharge Instructions (Addendum)
CT scan shows you have diverticulitis.  This is treated with antibiotic pills.   -Prescription was sent to your pharmacy for Augmentin.  This antibiotic can cause diarrhea, this is a typical side effect. -Prescription was also sent for Zofran.  This is a medication used to help with nausea.  Take if needed.   You should eat a bland diet until your abdominal pain has improved.  Follow-up with PCP for symptom recheck.

## 2020-10-27 NOTE — ED Notes (Signed)
275ml of water given for challenge

## 2020-10-30 ENCOUNTER — Ambulatory Visit: Payer: BC Managed Care – PPO | Admitting: Physical Therapy

## 2020-10-30 ENCOUNTER — Encounter: Payer: Self-pay | Admitting: Physical Therapy

## 2020-10-30 ENCOUNTER — Other Ambulatory Visit: Payer: Self-pay

## 2020-10-30 DIAGNOSIS — R293 Abnormal posture: Secondary | ICD-10-CM

## 2020-10-30 DIAGNOSIS — M545 Low back pain, unspecified: Secondary | ICD-10-CM

## 2020-10-30 DIAGNOSIS — G8929 Other chronic pain: Secondary | ICD-10-CM | POA: Diagnosis not present

## 2020-10-30 NOTE — Therapy (Addendum)
[facility] [place], [place], [address] Phone: [phone]   Fax:  [phone]  Physical Therapy Treatment  Patient Details  Name: Marvin Espinoza MRN: [id number] Date of Birth: 11/27/1971 Referring Provider (PT): Ronnie Doss DO   Encounter Date: 10/30/2020   PT End of Session - 10/30/20 0747    Visit Number 5    Number of Visits 6    Date for PT Re-Evaluation 11/15/20    PT Start Time 0729    PT Stop Time 0815    PT Time Calculation (min) 46 min    Activity Tolerance Patient tolerated treatment well    Behavior During Therapy [facility] for tasks assessed/performed           Past Medical History:  Diagnosis Date  . Anxiety   . Bipolar disorder ([place])   . CTS (carpal tunnel syndrome)   . Depression   . Diabetes mellitus without complication ([place])   . GERD (gastroesophageal reflux disease)   . H/O bariatric surgery 2016  . Head injury 05/06/2017  . Hypothyroidism   . Hypothyroidism   . Sleep apnea    no longer with OSA d/t over 100 pd weight loss    Past Surgical History:  Procedure Laterality Date  . ANAL FISSURE REPAIR    . BARIATRIC SURGERY    . CARPAL TUNNEL RELEASE Left 05/09/2015   Procedure: LEFT CARPAL TUNNEL RELEASE;  Surgeon: Daryll Brod, MD;  Location: [place];  Service: Orthopedics;  Laterality: Left;  . CARPAL TUNNEL RELEASE Left 05-09-2015  . CARPAL TUNNEL RELEASE Right 06/20/2015   Procedure: RIGHT CARPAL TUNNEL RELEASE;  Surgeon: Daryll Brod, MD;  Location: [place];  Service: Orthopedics;  Laterality: Right;  REGIONAL/FAB  . CHOLECYSTECTOMY    . COLONOSCOPY    . UPPER GASTROINTESTINAL ENDOSCOPY      There were no vitals filed for this visit.   Subjective Assessment - 10/30/20 0740    Subjective COVID 19 screening performed on patient upon arrival. Reports 5-6/10 low back pain today but states he got diverticulitis over the weekend so he's sore as well in the  abdominal region.    Pertinent History Bi-polar, CTR, h/o bilateral shoulder pain, head injury, hypothyroidism.    How long can you sit comfortably? Varies.    Patient Stated Goals Reduce pain.    Currently in Pain? Yes    Pain Score 6     Pain Location Back    Pain Orientation Left;Lower    Pain Descriptors / Indicators Discomfort    Pain Type Acute pain    Pain Onset More than a month ago    Pain Frequency Constant              [facility] PT Assessment - 10/30/20 0001      Assessment   Medical Diagnosis Lumbar DDD.    Referring Provider (PT) Ronnie Doss DO    Next MD Visit 12/23/2019      Precautions   Precautions None      Restrictions   Weight Bearing Restrictions No                         [facility] Adult PT Treatment/Exercise - 10/30/20 0001      Lumbar Exercises: Aerobic   Nustep L4 x16 min UE/LE acivity core focus      Lumbar Exercises: Supine   Ab Set 20 reps;3 seconds    Glut Set 20  reps;3 seconds    Clam 20 reps;2 seconds    Clam Limitations red theraband    Bent Knee Raise 20 reps;2 seconds    Bent Knee Raise Limitations red theraband    Bridge --    Bridge with Ball Squeeze 20 reps;3 seconds      Lumbar Exercises: Sidelying   Hip Abduction Both;20 reps;3 seconds      Modalities   Modalities Electrical Stimulation      Moist Heat Therapy   Number Minutes Moist Heat 15 Minutes    Moist Heat Location Lumbar Spine      Electrical Stimulation   Electrical Stimulation Location B low back    Electrical Stimulation Action IFC    Electrical Stimulation Parameters 80-150 hz x10 mins    Electrical Stimulation Goals Pain                       PT Long Term Goals - 10/23/20 0748      PT LONG TERM GOAL #1   Title Independent with a HEP.    Baseline Met 10/23/20    Time 6    Period Weeks    Status On-going      PT LONG TERM GOAL #2   Title Sit 30 minutes with pain not > 3/10.    Baseline 6/10 pain reported 10/23/20    Time  6    Period Weeks    Status On-going      PT LONG TERM GOAL #3   Title Perform ADL's with pain not > 4/10.    Baseline 6-7/10 per reported 10/23/20    Time 6    Period Weeks    Status On-going                 Plan - 10/30/20 0748    Clinical Impression Statement Patient arrived today with moderate low back pain but with also abdominal pain secondary to diverticulitis. Patient guided through supine TEs for core stability with hold times for endurance. Patient demonstrated good form with all after explanation. No adverse affects upon removal of modaliites.    Personal Factors and Comorbidities Comorbidity 1;Comorbidity 2;Comorbidity 3+    Comorbidities Bi-polar, CTR, h/o bilateral shoulder pain, head injury, hypothyroidism.    Examination-Activity Limitations Sit;Locomotion Level    Examination-Participation Restrictions Other    Stability/Clinical Decision Making Evolving/Moderate complexity    Clinical Decision Making Low    Rehab Potential Good    PT Frequency 1x / week    PT Duration 6 weeks    PT Treatment/Interventions ADLs/Self Care Home Management;Cryotherapy;Electrical Stimulation;Traction;Moist Heat;Therapeutic activities;Manual techniques;Patient/family education;Passive range of motion;Dry needling;Spinal Manipulations;Joint Manipulations    PT Next Visit Plan Core exercise progression.  Modalites and STW/M as needed..    Consulted and Agree with Plan of Care Patient           Patient will benefit from skilled therapeutic intervention in order to improve the following deficits and impairments:  Pain, Decreased activity tolerance, Postural dysfunction, Decreased range of motion  Visit Diagnosis: Chronic bilateral low back pain without sciatica  Abnormal posture     Problem List Patient Active Problem List   Diagnosis Date Noted  . History of gastric surgery 03/21/2020  . Overdose of benzodiazepine, intentional self-harm, initial encounter ([facility]) 01/26/2020   . MDD (major depressive disorder), recurrent severe, without psychosis ([facility]) 01/26/2020  . Drug overdose 01/25/2020  . Onychomycosis 01/05/2019  . Neuropathy 01/05/2019  . Severe episode of recurrent major depressive   disorder, without psychotic features ([place]) 10/04/2018  . OSA (obstructive sleep apnea) 09/07/2018  . Bilateral hip pain 10/24/2017  . Shoulder pain, bilateral 10/24/2017  . Arthritis 10/24/2017  . Osteoarthritis of both hips 10/24/2017  . Avascular necrosis of bone of hip, right ([place]) 10/24/2017  . 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 PTSD (post-traumatic stress disorder) 08/20/2017  . Tardive dyskinesia 08/20/2017  . Severe major depression ([place]) 05/10/2017  . Affective psychosis, bipolar ([place]) 05/10/2017  . Vision disturbance 05/06/2017  . History of concussion 02/10/2017  . Mood disorder ([place]) 02/10/2017  . Adjustment insomnia 01/24/2017  . Adjustment disorder with anxious mood 01/24/2017  . GAD (generalized anxiety disorder) 01/24/2017  . Gastroesophageal reflux disease without esophagitis 11/19/2016  . Hypothyroidism 11/19/2016  . DDD (degenerative disc disease), lumbar 11/19/2016    Gabriela Eves, PT, DPT 10/30/2020, 8:27 AM  [facility] [address] [place], [place], [address] Phone: [phone]   Fax:  [phone]  Name: Marvin Espinoza MRN: [id number] Date of Birth: 1971/12/14

## 2020-11-06 ENCOUNTER — Other Ambulatory Visit: Payer: Self-pay

## 2020-11-06 ENCOUNTER — Ambulatory Visit: Payer: BC Managed Care – PPO | Admitting: Physical Therapy

## 2020-11-06 DIAGNOSIS — G8929 Other chronic pain: Secondary | ICD-10-CM | POA: Diagnosis not present

## 2020-11-06 DIAGNOSIS — R293 Abnormal posture: Secondary | ICD-10-CM

## 2020-11-06 DIAGNOSIS — M545 Low back pain, unspecified: Secondary | ICD-10-CM | POA: Diagnosis not present

## 2020-11-06 NOTE — Therapy (Addendum)
[facility] [place], [place], [address] Phone: [phone]   Fax:  [phone]  Physical Therapy Treatment PHYSICAL THERAPY DISCHARGE SUMMARY  Visits from Start of Care: 6  Current functional level related to goals / functional outcomes: See below   Remaining deficits: See goals   Education / Equipment: HEP Plan: Patient agrees to discharge.  Patient goals were partially met. Patient is being discharged due to the patient's request.  ?????  Patient to receive MRI. Gabriela Eves, PT, DPT 02/16/21   Patient Details  Name: Marvin Espinoza MRN: [id number] Date of Birth: 1971-09-23 Referring Provider (PT): Ronnie Doss DO   Encounter Date: 11/06/2020   PT End of Session - 11/06/20 0734    Visit Number 6    Number of Visits 6    Date for PT Re-Evaluation 11/15/20    PT Start Time 0732    PT Stop Time 0819    PT Time Calculation (min) 47 min    Activity Tolerance Patient tolerated treatment well    Behavior During Therapy [facility] for tasks assessed/performed           Past Medical History:  Diagnosis Date  . Anxiety   . Bipolar disorder ([place])   . CTS (carpal tunnel syndrome)   . Depression   . Diabetes mellitus without complication ([place])   . GERD (gastroesophageal reflux disease)   . H/O bariatric surgery 2016  . Head injury 05/06/2017  . Hypothyroidism   . Hypothyroidism   . Sleep apnea    no longer with OSA d/t over 100 pd weight loss    Past Surgical History:  Procedure Laterality Date  . ANAL FISSURE REPAIR    . BARIATRIC SURGERY    . CARPAL TUNNEL RELEASE Left 05/09/2015   Procedure: LEFT CARPAL TUNNEL RELEASE;  Surgeon: Daryll Brod, MD;  Location: [place];  Service: Orthopedics;  Laterality: Left;  . CARPAL TUNNEL RELEASE Left 05-09-2015  . CARPAL TUNNEL RELEASE Right 06/20/2015   Procedure: RIGHT CARPAL TUNNEL RELEASE;  Surgeon: Daryll Brod, MD;  Location: [place];  Service: Orthopedics;  Laterality: Right;  REGIONAL/FAB  . CHOLECYSTECTOMY    . COLONOSCOPY    . UPPER GASTROINTESTINAL ENDOSCOPY      There were no vitals filed for this visit.   Subjective Assessment - 11/06/20 0734    Subjective COVID 19 screening performed on patient upon arrival. Reports "at least a 9 today" States he was raking leaves.    Pertinent History Bi-polar, CTR, h/o bilateral shoulder pain, head injury, hypothyroidism.    How long can you sit comfortably? Varies.    Patient Stated Goals Reduce pain.    Currently in Pain? Yes    Pain Score 9     Pain Location Back    Pain Orientation Left;Lower    Pain Descriptors / Indicators Discomfort    Pain Type Acute pain    Pain Onset More than a month ago    Pain Frequency Constant              [facility] PT Assessment - 11/06/20 0001      Assessment   Medical Diagnosis Lumbar DDD.    Referring Provider (PT) Ronnie Doss DO      Precautions   Precautions None      Restrictions   Weight Bearing Restrictions No  [facility] Adult PT Treatment/Exercise - 11/06/20 0001      Lumbar Exercises: Aerobic   Nustep L3 x10 min UE/LE acivity core focus      Lumbar Exercises: Supine   Ab Set 20 reps;3 seconds    Clam 20 reps;2 seconds    Clam Limitations red theraband    Bridge with clamshell 20 reps;2 seconds    Bridge with Ball Squeeze Limitations red theraband    Straight Leg Raise 20 reps;3 seconds   2x10     Modalities   Modalities Electrical Stimulation      Moist Heat Therapy   Number Minutes Moist Heat 15 Minutes    Moist Heat Location Lumbar Spine      Electrical Stimulation   Electrical Stimulation Location B ow back    Electrical Stimulation Action IFC    Electrical Stimulation Parameters 80-150 hz x15 mins    Electrical Stimulation Goals Pain                       PT Long Term Goals - 11/06/20 0748      PT LONG TERM GOAL #1   Title  Independent with a HEP.    Baseline Met 10/23/20    Time 6    Period Weeks    Status Achieved      PT LONG TERM GOAL #2   Title Sit 30 minutes with pain not > 3/10.    Baseline Pain about 5-6/10 11/06/2020    Time 6    Period Weeks    Status Not Met      PT LONG TERM GOAL #3   Title Perform ADL's with pain not > 4/10.    Baseline 4-5/10 with ADLs 11/06/2020    Time 6    Period Weeks    Status Partially Met                 Plan - 11/06/20 0746    Clinical Impression Statement Patient arrives to physical therapy with 9/10 low back pain, L>R secondary to raking leaves over the weekend. Supine exercises performed with minimal complaints of increased pain. Patient's goals are partially met. Patient will be placed on hold until MRI results and consultation from MD.    Personal Factors and Comorbidities Comorbidity 1;Comorbidity 2;Comorbidity 3+    Comorbidities Bi-polar, CTR, h/o bilateral shoulder pain, head injury, hypothyroidism.    Examination-Activity Limitations Sit;Locomotion Level    Examination-Participation Restrictions Other    Stability/Clinical Decision Making Evolving/Moderate complexity    Clinical Decision Making Low    Rehab Potential Good    PT Frequency 1x / week    PT Duration 6 weeks    PT Treatment/Interventions ADLs/Self Care Home Management;Cryotherapy;Electrical Stimulation;Traction;Moist Heat;Therapeutic activities;Manual techniques;Patient/family education;Passive range of motion;Dry needling;Spinal Manipulations;Joint Manipulations    PT Next Visit Plan Core exercise progression.  Modalites and STW/M as needed.Marland Kitchen    Consulted and Agree with Plan of Care Patient           Patient will benefit from skilled therapeutic intervention in order to improve the following deficits and impairments:  Pain, Decreased activity tolerance, Postural dysfunction, Decreased range of motion  Visit Diagnosis: Chronic bilateral low back pain without  sciatica  Abnormal posture     Problem List Patient Active Problem List   Diagnosis Date Noted  . History of gastric surgery 03/21/2020  . Overdose of benzodiazepine, intentional self-harm, initial encounter ([place]) 01/26/2020  . MDD (major depressive disorder), recurrent severe, without psychosis (  [place]) 01/26/2020  . Drug overdose 01/25/2020  . Onychomycosis 01/05/2019  . Neuropathy 01/05/2019  . Severe episode of recurrent major depressive disorder, without psychotic features ([place]) 10/04/2018  . OSA (obstructive sleep apnea) 09/07/2018  . Bilateral hip pain 10/24/2017  . Shoulder pain, bilateral 10/24/2017  . Arthritis 10/24/2017  . Osteoarthritis of both hips 10/24/2017  . Avascular necrosis of bone of hip, right ([place]) 10/24/2017  . 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 PTSD (post-traumatic stress disorder) 08/20/2017  . Tardive dyskinesia 08/20/2017  . Severe major depression ([place]) 05/10/2017  . Affective psychosis, bipolar ([place]) 05/10/2017  . Vision disturbance 05/06/2017  . History of concussion 02/10/2017  . Mood disorder ([place]) 02/10/2017  . Adjustment insomnia 01/24/2017  . Adjustment disorder with anxious mood 01/24/2017  . GAD (generalized anxiety disorder) 01/24/2017  . Gastroesophageal reflux disease without esophagitis 11/19/2016  . Hypothyroidism 11/19/2016  . DDD (degenerative disc disease), lumbar 11/19/2016    Gabriela Eves, PT, DPT 11/06/2020, 8:21 AM  [facility] [place], [place], [address] Phone: [phone]   Fax:  [phone]  Name: Marvin Espinoza MRN: [id number] Date of Birth: 1971-10-26

## 2020-12-19 ENCOUNTER — Other Ambulatory Visit: Payer: Self-pay | Admitting: Family Medicine

## 2020-12-19 ENCOUNTER — Telehealth: Payer: Self-pay

## 2020-12-19 NOTE — Telephone Encounter (Signed)
Patient reports that he has been taking his Levothyroxine for years with his other medications and vitamins.  I advised patient to take Levothyroxine first thing in the morning as soon as he wakes up with a small sip of water and to wait at least 30 minutes before eating or drinking anything.  Also advised he should take other meds/vitamins later in the day.  Patient voices understanding.

## 2020-12-22 ENCOUNTER — Ambulatory Visit: Payer: BC Managed Care – PPO | Admitting: Family Medicine

## 2020-12-29 ENCOUNTER — Other Ambulatory Visit: Payer: Self-pay

## 2020-12-29 ENCOUNTER — Encounter: Payer: Self-pay | Admitting: Family Medicine

## 2020-12-29 ENCOUNTER — Ambulatory Visit (INDEPENDENT_AMBULATORY_CARE_PROVIDER_SITE_OTHER): Payer: 59 | Admitting: Family Medicine

## 2020-12-29 VITALS — BP 139/87 | HR 76 | Temp 97.4°F | Ht 70.0 in | Wt 224.0 lb

## 2020-12-29 DIAGNOSIS — Z9884 Bariatric surgery status: Secondary | ICD-10-CM

## 2020-12-29 DIAGNOSIS — K5909 Other constipation: Secondary | ICD-10-CM | POA: Diagnosis not present

## 2020-12-29 DIAGNOSIS — E039 Hypothyroidism, unspecified: Secondary | ICD-10-CM

## 2020-12-29 DIAGNOSIS — F332 Major depressive disorder, recurrent severe without psychotic features: Secondary | ICD-10-CM

## 2020-12-29 DIAGNOSIS — E1169 Type 2 diabetes mellitus with other specified complication: Secondary | ICD-10-CM

## 2020-12-29 LAB — BAYER DCA HB A1C WAIVED: HB A1C (BAYER DCA - WAIVED): 5.9 % (ref <7.0)

## 2020-12-29 NOTE — Progress Notes (Signed)
Subjective: CC: DM, thyroid d/o PCP: Janora Norlander, DO Marvin Espinoza is a 50 y.o. male presenting to clinic today for:  1. Type 2 Diabetes with h/o bariatric surgery/ depression:  Feels that blood sugar might be up a little bit today because has been stress eating more.  He has been having increased depressive symptoms.  He continues to follow-up with his psychiatrist for this but has had failures of many medications and/or allergy.  No chest pain, shortness of breath, visual disturbance  Last eye exam: Up-to-date Last foot exam: Up-to-date Last A1c:  Lab Results  Component Value Date   HGBA1C 6.0 09/27/2020   Nephropathy screen indicated?:  Up-to-date Last flu, zoster and/or pneumovax:  Immunization History  Administered Date(s) Administered  . Influenza,inj,Quad PF,6+ Mos 11/19/2016, 10/20/2017, 09/11/2018, 08/20/2019, 09/27/2020  . Influenza,inj,quad, With Preservative 12/16/2017  . Moderna Sars-Covid-2 Vaccination 03/04/2020, 04/05/2020  . Tdap 05/10/2020    2. Hypothyroidism; constipation Compliant with Synthroid but admits that he had not been taking appropriately.  He was taken with multivitamins and GERD medication.  He most recently switched it to 2 AM on an empty stomach around 1 and half weeks ago.  Denies any diarrhea or any other concerning symptoms for hyper thyroidism.  He continues to have constipation but this is somewhat relieved by increasing consumption of food.  Failed Linzess, Amitiza and Trulance  ROS: Per HPI  Allergies  Allergen Reactions  . Abilify [Aripiprazole] Anaphylaxis and Swelling    "slurred speech" FACIAL DROOPING, TONGUE SWELLING, TARDIVE DYSKENESIA   . Caplyta [Lumateperone] Anaphylaxis  . Seroquel [Quetiapine Fumarate]     tardive dyskinesia  . Alcohol Itching    Drinking alcohol  . Ambien [Zolpidem] Other (See Comments)    NIGHT TERRORS  . Anacin-3 [Acetaminophen] Itching    REGULAR TYLENOL IS OKAY.  NEEDS BENADRYL TO  TAKE THIS  . Cariprazine Other (See Comments)    Tardive dyskenesia  . Cariprazine Hcl Itching  . Latuda [Lurasidone Hcl] Other (See Comments)    RUNS SUGAR TOO HIGH  . Lurasidone Other (See Comments)    TARDIVE DYSKINESIA  . Prozac [Fluoxetine] Itching  . Victoza [Liraglutide] Other (See Comments)    Severe heartburn   . Bupropion Rash  . Hydrocodone-Acetaminophen Itching  . Risperidone Rash  . Tegretol [Carbamazepine] Rash   Past Medical History:  Diagnosis Date  . Anxiety   . Bipolar disorder ([place])   . CTS (carpal tunnel syndrome)   . Depression   . Diabetes mellitus without complication ([place])   . GERD (gastroesophageal reflux disease)   . H/O bariatric surgery 2016  . Head injury 05/06/2017  . Hypothyroidism   . Hypothyroidism   . Sleep apnea    no longer with OSA d/t over 100 pd weight loss    Current Outpatient Medications:  .  ALPRAZolam (XANAX) 1 MG tablet, 1 mg. Six times per day prn, Disp: , Rfl:  .  baclofen (LIORESAL) 10 MG tablet, Take 0.5-1 tablets (5-10 mg total) by mouth 3 (three) times daily as needed for muscle spasms., Disp: 30 each, Rfl: 0 .  Blood Glucose Monitoring Suppl (ONETOUCH VERIO) w/Device KIT, Use to test blood sugar daily as directed, Disp: 1 kit, Rfl: 0 .  Calcium-Vitamin D-Vitamin K (SM CALCIUM SOFT CHEWS PO), Take 1 application by mouth 2 (two) times daily. Chew calcium tabs twice a day.  (bariatric surgery), Disp: , Rfl:  .  Cyanocobalamin (VITAMIN B-12) 5000 MCG TBDP, Take 1 tablet by mouth daily.,  Disp: , Rfl:  .  docusate sodium (COLACE) 100 MG capsule, Take 100 mg by mouth 2 (two) times daily., Disp: , Rfl:  .  glucose blood test strip, Use to test blood sugar daily as directed, Disp: 100 each, Rfl: 12 .  lamoTRIgine (LAMICTAL) 200 MG tablet, Take 400 mg by mouth at bedtime. , Disp: , Rfl:  .  levothyroxine (SYNTHROID) 137 MCG tablet, TAKE 1 TABLET BY MOUTH DAILY BEFORE BREAKFAST, Disp: 90 tablet, Rfl: 2 .  metFORMIN (GLUCOPHAGE) 500 MG  tablet, TAKE ONE TABLET TWICE A DAY WITH MEALS., Disp: 180 tablet, Rfl: 0 .  Multiple Vitamin (MULTIVITAMIN WITH MINERALS) TABS tablet, Take 1 tablet by mouth 2 (two) times daily. For Vitamin supplementation (Patient taking differently: Take 1 tablet by mouth 2 (two) times daily. For Vitamin supplementation FROM GASTRIC BYPASS), Disp: , Rfl:  .  OneTouch Delica Lancets 79G MISC, Use to test blood sugar daily as directed, Disp: 100 each, Rfl: 3 .  pantoprazole (PROTONIX) 40 MG tablet, Take 1 tablet (40 mg total) by mouth 2 (two) times daily. Failed Nexium, Omeprazole, Prevacid OTC, Pepcid, Zantac, Disp: 180 tablet, Rfl: 3 .  promethazine (PHENERGAN) 12.5 MG tablet, TAKE 1 TO 2 TABLETS EVERY 8 HOURS AS NEEDED FOR NAUSEA AND VOMITING, Disp: 60 tablet, Rfl: 1 .  psyllium (METAMUCIL) 58.6 % powder, Take 1 packet by mouth 2 (two) times daily., Disp: , Rfl:  .  vitamin B-12 (CYANOCOBALAMIN) 250 MCG tablet, Take 1 tablet (250 mcg total) by mouth daily. For low B-12 supplementation, Disp: 1 tablet, Rfl:  Social History   Socioeconomic History  . Marital status: Married    Spouse name: Mickel Baas  . Number of children: 1  . Years of education: Not on file  . Highest education level: Not on file  Occupational History  . Not on file  Tobacco Use  . Smoking status: Never Smoker  . Smokeless tobacco: Never Used  Vaping Use  . Vaping Use: Never used  Substance and Sexual Activity  . Alcohol use: No    Comment: severe allergy to alcohol!!  . Drug use: No  . Sexual activity: Yes  Other Topics Concern  . Not on file  Social History Narrative   Patient lives in mothers' home. He stays alone until nighttime when wife comes up.    He stays there to keep an eye on the house.    His son comes to visit also.          Social Determinants of Health   Financial Resource Strain: Not on file  Food Insecurity: Not on file  Transportation Needs: Not on file  Physical Activity: Not on file  Stress: Not on file   Social Connections: Not on file  Intimate Partner Violence: Not on file   Family History  Problem Relation Age of Onset  . Diabetes Mother   . Heart disease Mother   . Depression Mother   . Esophageal cancer Mother   . COPD Father   . Heart disease Father   . Kidney disease Father   . Mental illness Father   . Depression Father   . Mental illness Sister   . Depression Sister   . Esophageal cancer Maternal Uncle   . Colon cancer Neg Hx   . Rectal cancer Neg Hx   . Stomach cancer Neg Hx     Objective: Office vital signs reviewed. BP 139/87   Pulse 76   Temp (!) 97.4 F (36.3 C) (Temporal)  Ht 5' 10" (1.778 m)   Wt 224 lb (101.6 kg)   SpO2 97%   BMI 32.14 kg/m   Physical Examination:  General: Awake, alert, well nourished, appears anxious HEENT: Normal; no exophthalmos.  No goiter  Cardio: regular rate and rhythm, S1S2 heard, no murmurs appreciated Pulm: clear to auscultation bilaterally, no wheezes, rhonchi or rales; normal work of breathing on room air MSK: Normal gait and station Neuro: No tremor  Assessment/ Plan: 50 y.o. male   Acquired hypothyroidism - Plan: TSH  Controlled type 2 diabetes mellitus with other specified complication, without long-term current use of insulin (HCC) - Plan: Bayer DCA Hb A1c Waived  History of bariatric surgery  Chronic constipation  MDD (major depressive disorder), recurrent severe, without psychosis ([facility]CC)  He has chronic constipation.  Check TSH since he is recently changed the method of which she is taking his medicine.  May be too soon to tell  A1c continues to be under excellent control at 5.9 today.  Continue current regimen  Failed Linzess, Amitiza and Trulance.  Constipation currently stable with diet modification  Continuing to have major depressive disorder.  He is closely followed by psychiatry and currently managed with medication.  He is considering a second opinion at perhaps an academic center for  alternative therapies, as he and his psychiatrist have really exhausted what is available in a community setting.  Orders Placed This Encounter  Procedures  . Bayer DCA Hb A1c Waived  . TSH   No orders of the defined types were placed in this encounter.     M , DO [facility] [phone]   

## 2020-12-30 LAB — TSH: TSH: 1.43 u[IU]/mL (ref 0.450–4.500)

## 2021-02-24 ENCOUNTER — Other Ambulatory Visit: Payer: Self-pay | Admitting: Family Medicine

## 2021-03-18 IMAGING — CT CT CERVICAL SPINE W/O CM
3 series · 12 of 33 positions shown, 14 images · non-contrast
Comparison: 06/14/2019

CLINICAL DATA: Fell. Hit head.

EXAM:
CT HEAD WITHOUT CONTRAST
CT CERVICAL SPINE WITHOUT CONTRAST
TECHNIQUE: Multidetector CT imaging of the head and cervical spine was
performed following the standard protocol without intravenous
contrast. Multiplanar CT image reconstructions of the cervical spine
were also generated.

[Series 4: c_spine 2.0 st · axial · 0.34mm/px · z∈[-328,-160]mm · 4 of 122 slices shown, 5 images]
[im 19/122  soft-tissue]
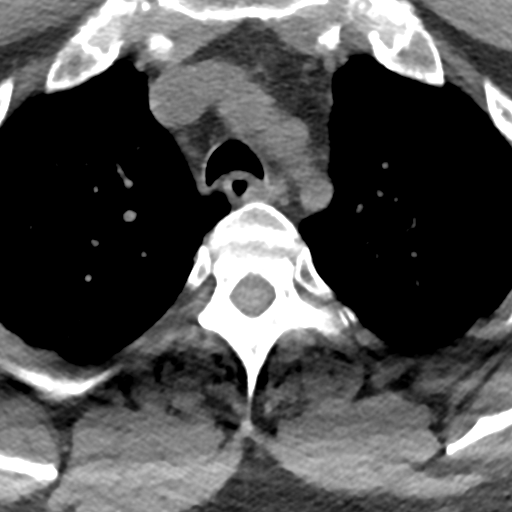
[im 19/122  bone]
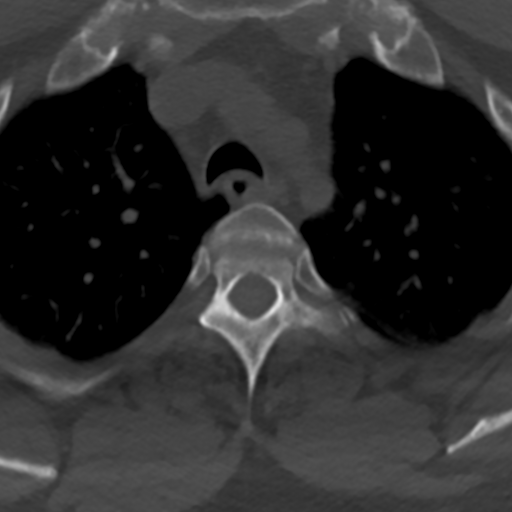
[im 47/122  bone]
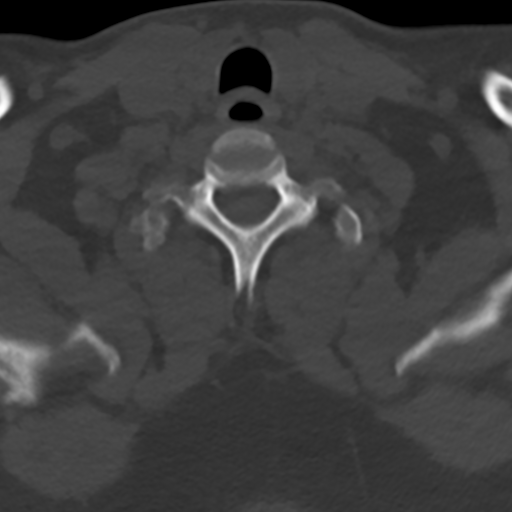
[im 75/122  bone]
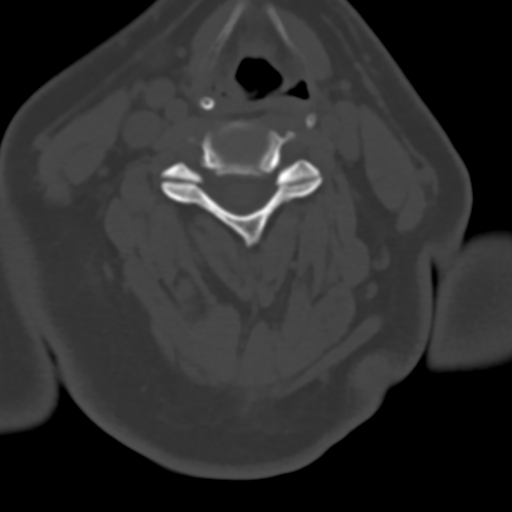
[im 103/122  bone]
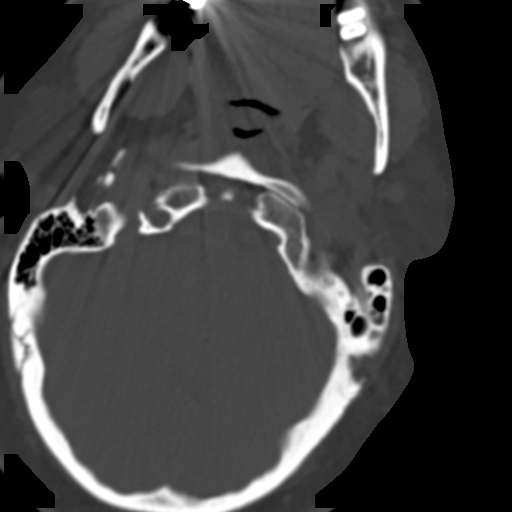

[Series 5: coronal bone · coronal · 0.29mm/px · 3 of 74 slices shown]
[im 15/74  bone]
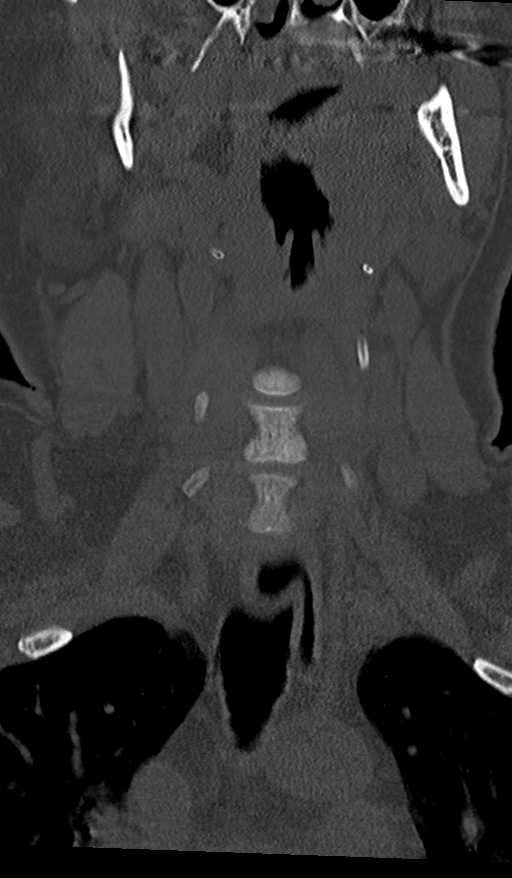
[im 30/74  bone]
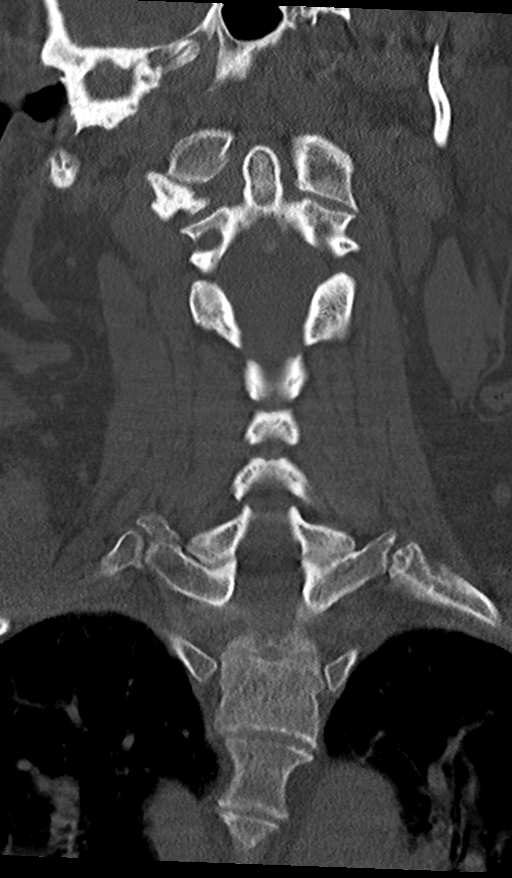
[im 44/74  bone]
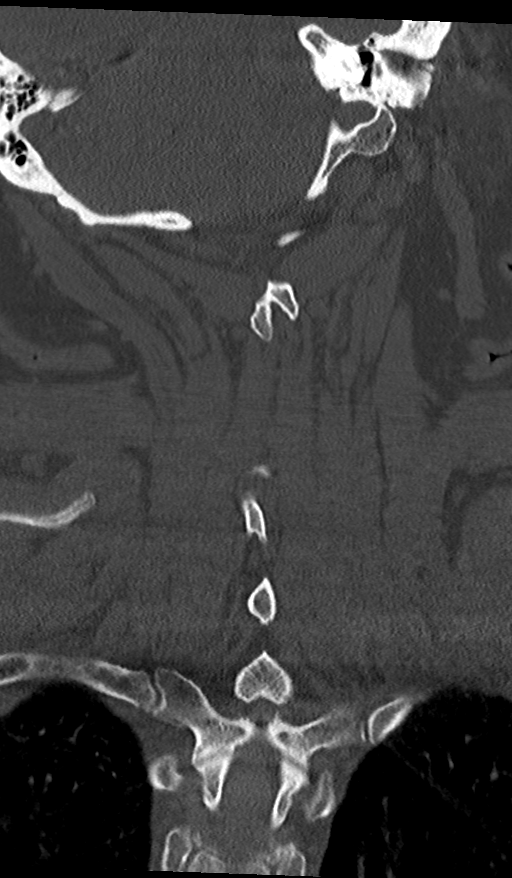

[Series 6: sagittal bone · sagittal · 0.30mm/px · 5 of 61 slices shown, 6 images]
[im 21/61  bone]
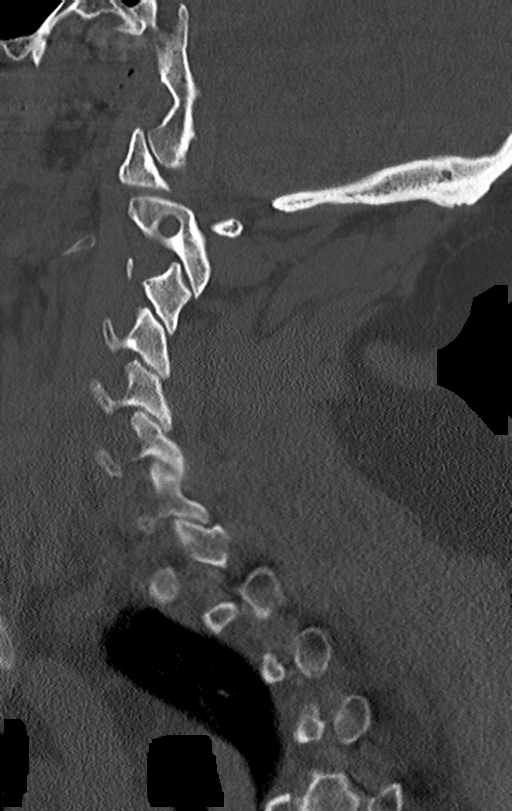
[im 26/61  bone]
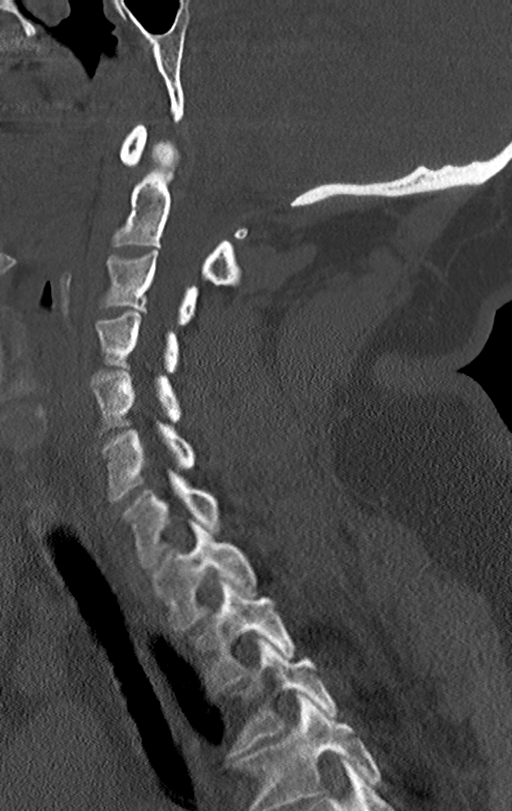
[im 31/61  soft-tissue]
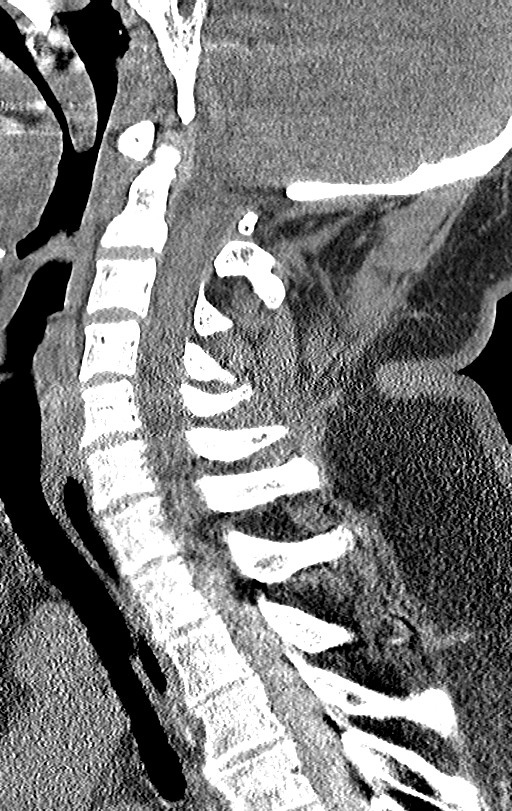
[im 31/61  bone]
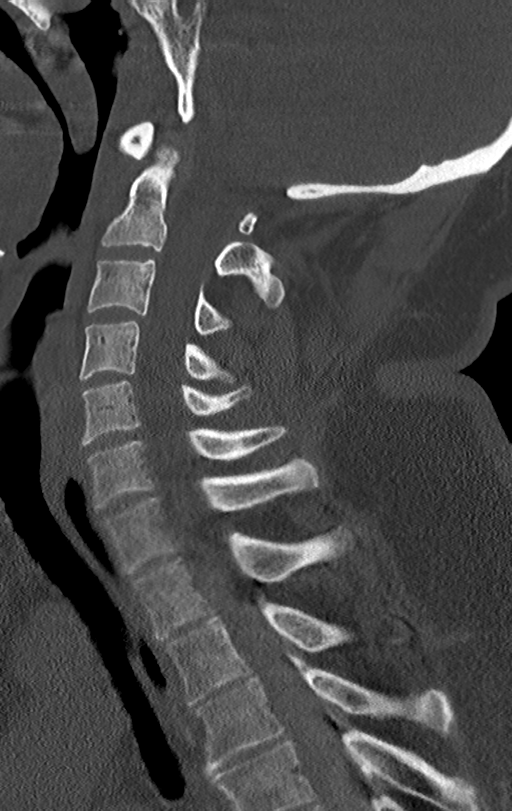
[im 36/61  bone]
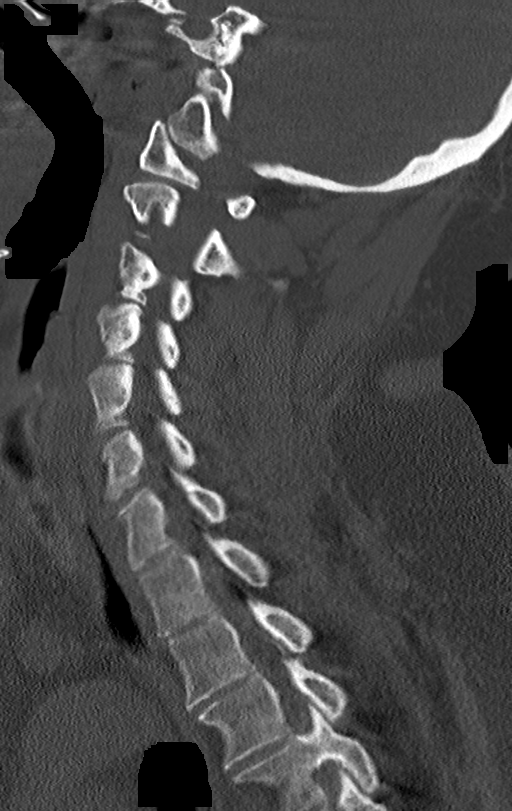
[im 41/61  bone]
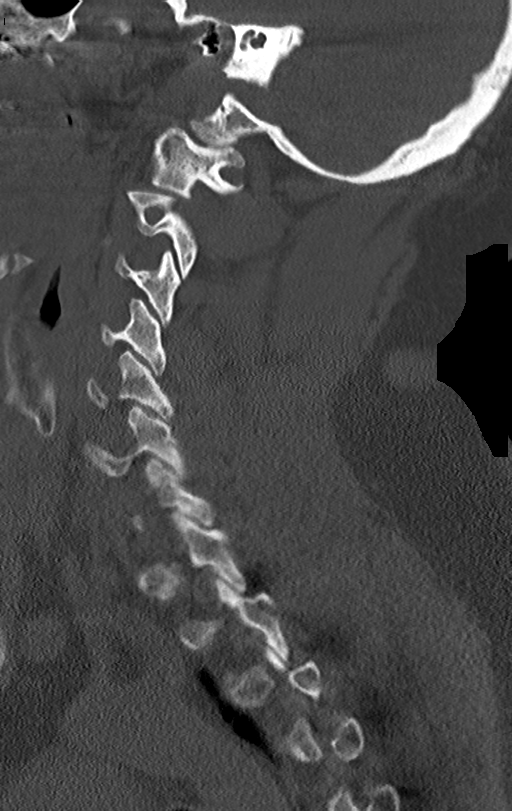

[12 of 33 positions shown; findings below may reference images not displayed]

FINDINGS: CT HEAD FINDINGS

Examination limited by patient motion. Patient combative.

Brain: The ventricles are normal in size and configuration. No
extra-axial fluid collections are identified. The gray-white
differentiation is maintained. No CT findings for acute hemispheric
infarction or intracranial hemorrhage. No mass lesions. The
brainstem and cerebellum are normal.

Vascular: No hyperdense vessels or obvious aneurysm.

Skull: No acute skull fracture. No bone lesion.

Sinuses/Orbits: The paranasal sinuses and mastoid air cells are
clear. The globes are intact.

Other: No scalp lesions, laceration or hematoma.

CT CERVICAL SPINE FINDINGS

Alignment: Normal

Skull base and vertebrae: Choose 1

Soft tissues and spinal canal: Choose 1

Disc levels: The spinal canal is fairly generous. No significant
spinal or foraminal stenosis.

Upper chest: The lung apices are grossly clear.

Other: No neck mass or adenopathy.
IMPRESSION: 1. No acute intracranial findings or skull fracture.
2. Normal alignment of the cervical vertebral bodies and no acute
fracture.

## 2021-03-18 IMAGING — CT CT HEAD W/O CM
3 of 4 series · 15 of 47 positions shown, 18 images · non-contrast
Comparison: 06/14/2019

CLINICAL DATA: Fell. Hit head.

EXAM:
CT HEAD WITHOUT CONTRAST
CT CERVICAL SPINE WITHOUT CONTRAST
TECHNIQUE: Multidetector CT imaging of the head and cervical spine was
performed following the standard protocol without intravenous
contrast. Multiplanar CT image reconstructions of the cervical spine
were also generated.

[Series 3: head 2.0 h70h · axial · 0.48mm/px · z∈[-148,+0]mm · 9 of 94 slices shown, 12 images]
[im 10/94  brain]
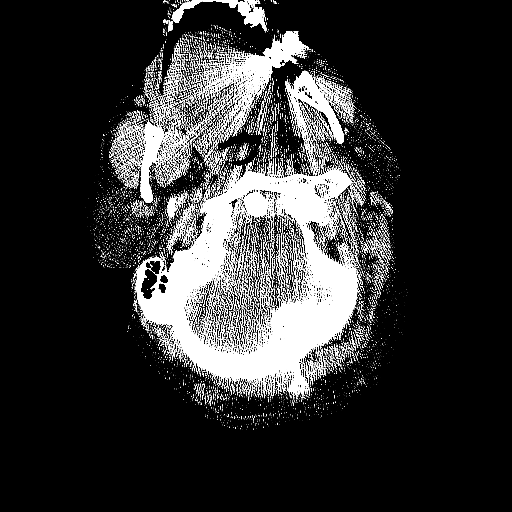
[im 10/94  bone]
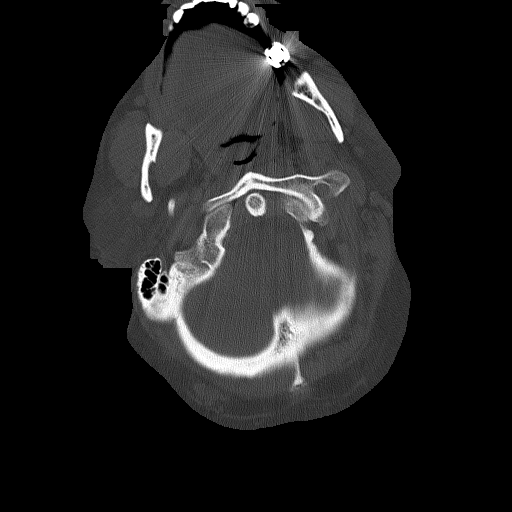
[im 19/94  brain]
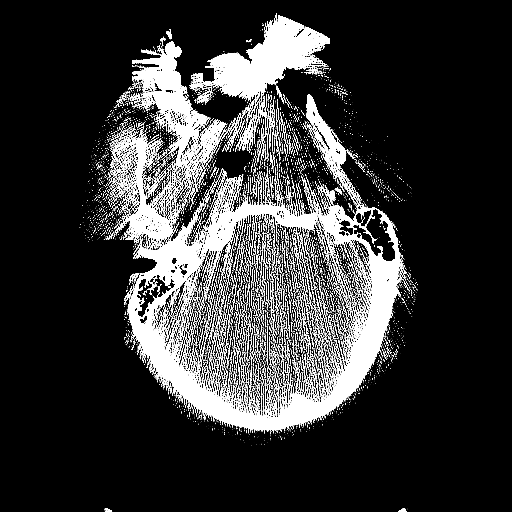
[im 28/94  brain]
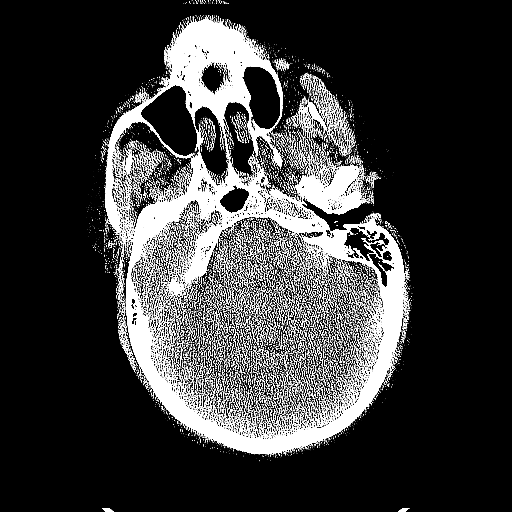
[im 38/94  brain]
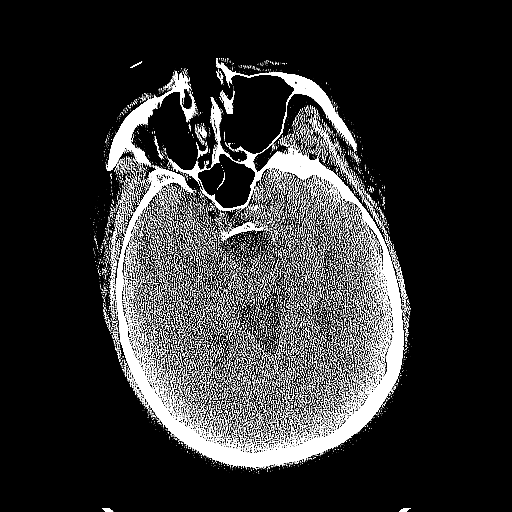
[im 47/94  brain]
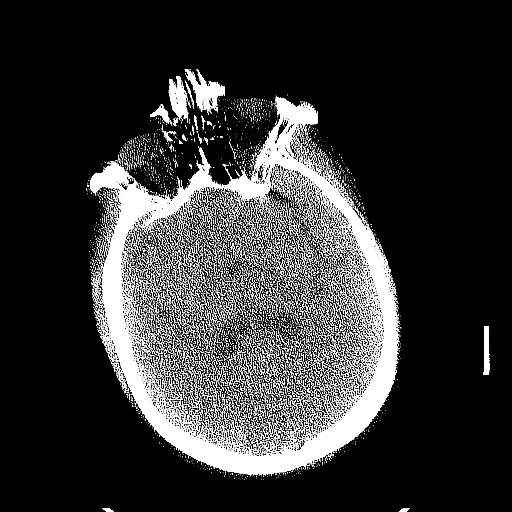
[im 47/94  bone]
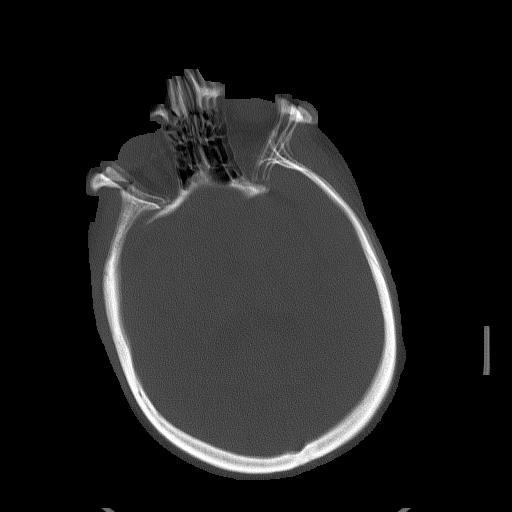
[im 56/94  brain]
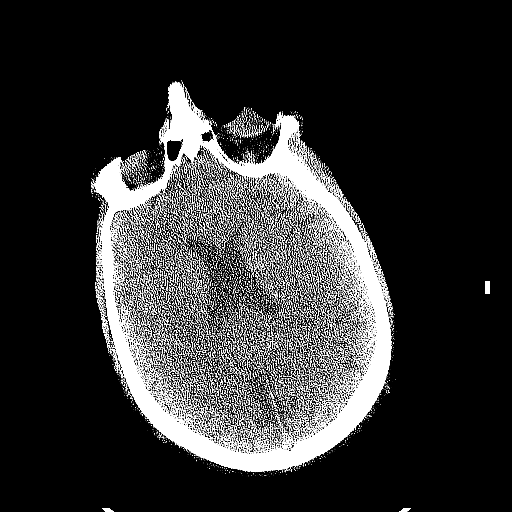
[im 66/94  brain]
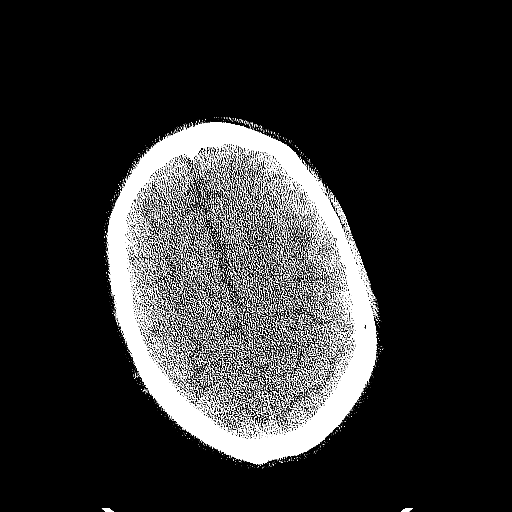
[im 75/94  brain]
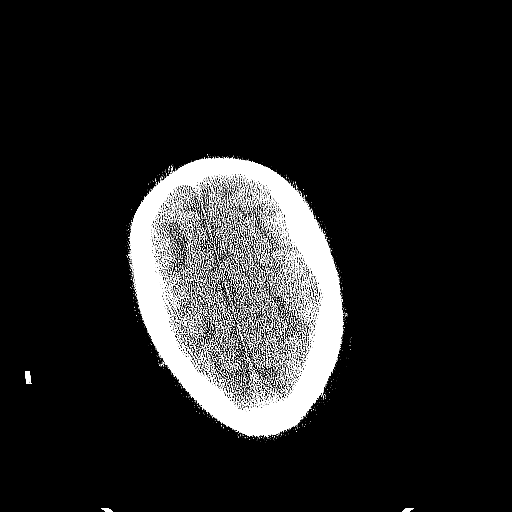
[im 84/94  brain]
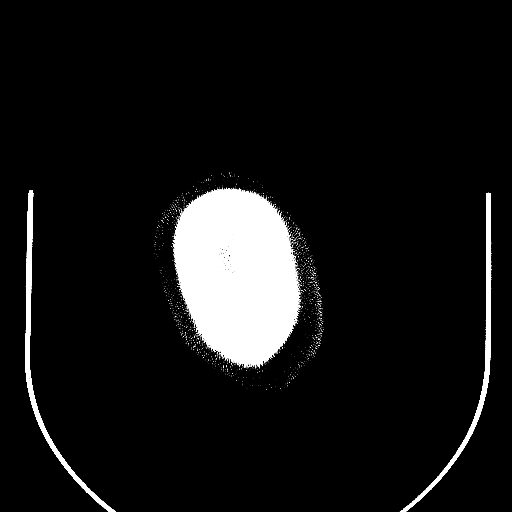
[im 84/94  bone]
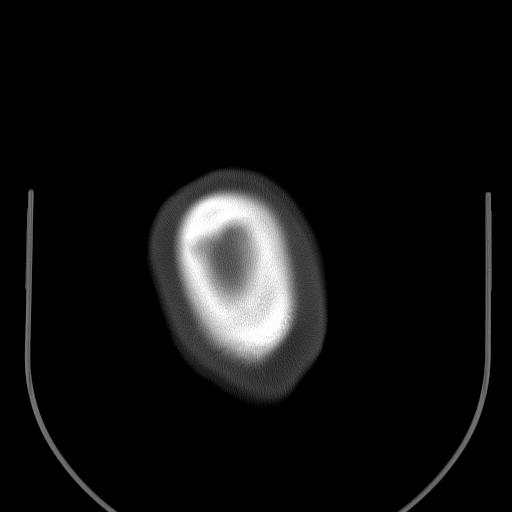

[Series 4: head 3.0 mpr cor · coronal · 0.37mm/px · 3 of 74 slices shown]
[im 25/74  brain]
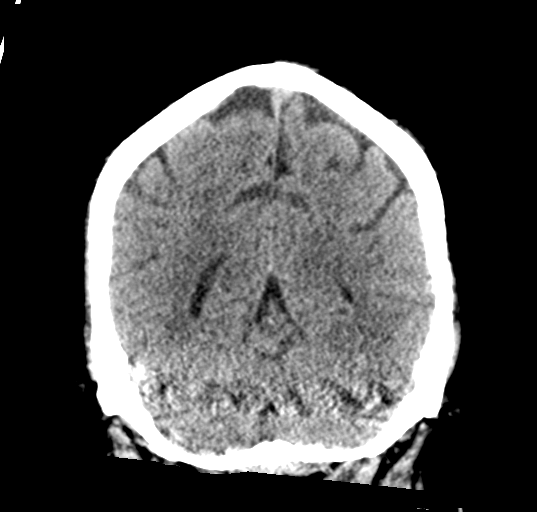
[im 33/74  brain]
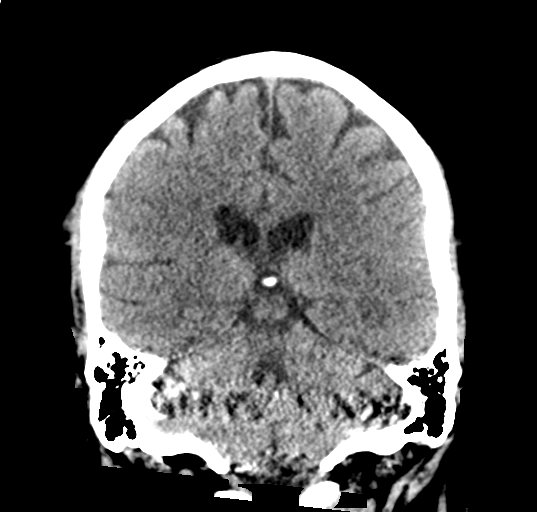
[im 41/74  brain]
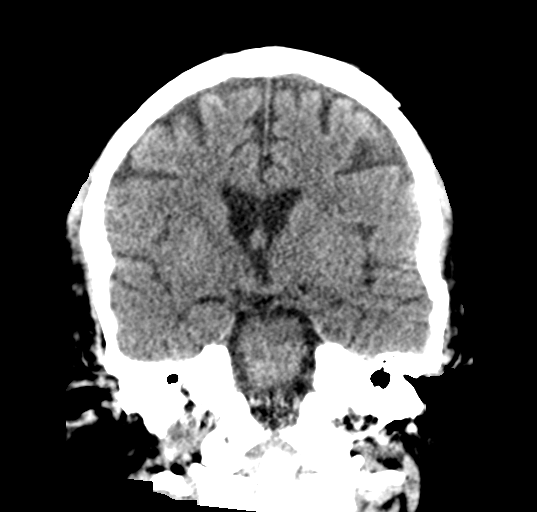

[Series 5: head 3.0 mpr sag · sagittal · 0.37mm/px · 3 of 59 slices shown]
[im 22/59  brain]
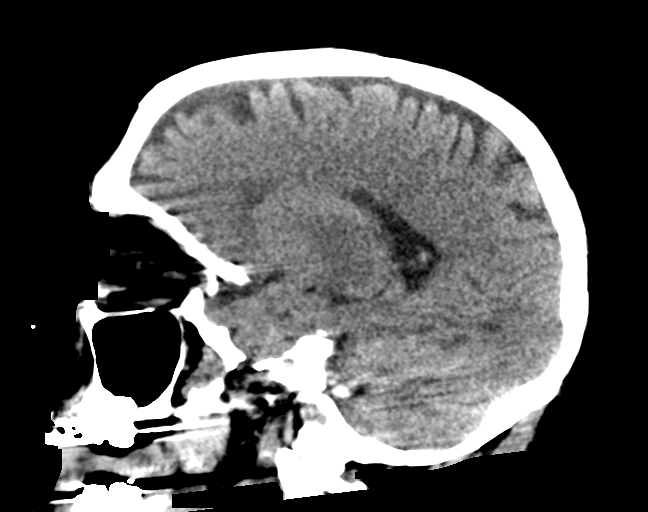
[im 30/59  brain]
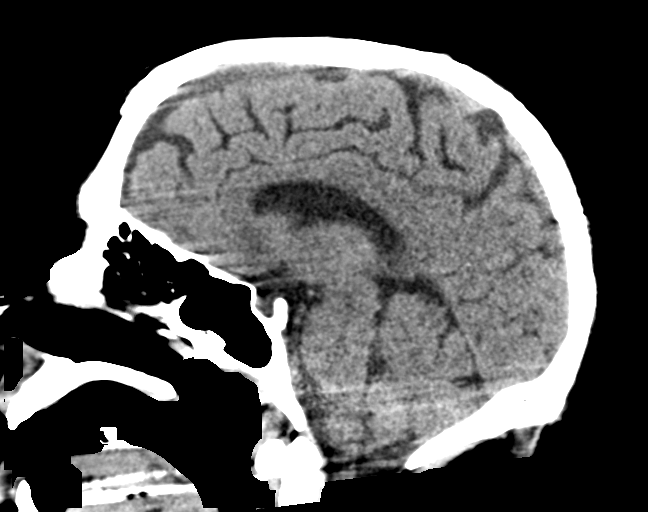
[im 37/59  brain]
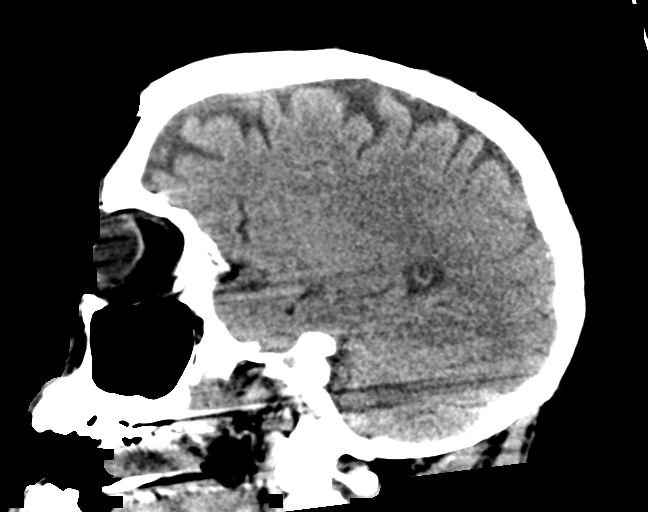

[15 of 47 positions shown; findings below may reference images not displayed]

FINDINGS: CT HEAD FINDINGS

Examination limited by patient motion. Patient combative.

Brain: The ventricles are normal in size and configuration. No
extra-axial fluid collections are identified. The gray-white
differentiation is maintained. No CT findings for acute hemispheric
infarction or intracranial hemorrhage. No mass lesions. The
brainstem and cerebellum are normal.

Vascular: No hyperdense vessels or obvious aneurysm.

Skull: No acute skull fracture. No bone lesion.

Sinuses/Orbits: The paranasal sinuses and mastoid air cells are
clear. The globes are intact.

Other: No scalp lesions, laceration or hematoma.

CT CERVICAL SPINE FINDINGS

Alignment: Normal

Skull base and vertebrae: Choose 1

Soft tissues and spinal canal: Choose 1

Disc levels: The spinal canal is fairly generous. No significant
spinal or foraminal stenosis.

Upper chest: The lung apices are grossly clear.

Other: No neck mass or adenopathy.
IMPRESSION: 1. No acute intracranial findings or skull fracture.
2. Normal alignment of the cervical vertebral bodies and no acute
fracture.

## 2021-03-20 ENCOUNTER — Other Ambulatory Visit: Payer: Self-pay | Admitting: Family Medicine

## 2021-03-20 ENCOUNTER — Ambulatory Visit: Payer: 59 | Admitting: Nurse Practitioner

## 2021-03-20 ENCOUNTER — Encounter: Payer: Self-pay | Admitting: Nurse Practitioner

## 2021-03-20 ENCOUNTER — Other Ambulatory Visit: Payer: Self-pay

## 2021-03-20 VITALS — BP 123/76 | HR 67 | Temp 97.8°F | Resp 20 | Ht 70.0 in | Wt 249.0 lb

## 2021-03-20 DIAGNOSIS — M5136 Other intervertebral disc degeneration, lumbar region: Secondary | ICD-10-CM

## 2021-03-20 MED ORDER — KETOROLAC TROMETHAMINE 60 MG/2ML IM SOLN
60.0000 mg | Freq: Once | INTRAMUSCULAR | Status: AC
  Administered 2021-03-20: 60 mg via INTRAMUSCULAR

## 2021-03-20 MED ORDER — PREDNISONE 20 MG PO TABS: ORAL_TABLET | ORAL | 0 refills | Status: DC

## 2021-03-20 NOTE — Patient Instructions (Signed)
Chronic Back Pain When back pain lasts longer than 3 months, it is called chronic back pain. Pain may get worse at certain times (flare-ups). There are things you can do at home to manage your pain. Follow these instructions at home: Pay attention to any changes in your symptoms. Take these actions to help with your pain: Managing pain and stiffness  If told, put ice on the painful area. Your doctor may tell you to use ice for 24-48 hours after the flare-up starts. To do this: ? Put ice in a plastic bag. ? Place a towel between your skin and the bag. ? Leave the ice on for 20 minutes, 2-3 times a day.  If told, put heat on the painful area. Do this as often as told by your doctor. Use the heat source that your doctor recommends, such as a moist heat pack or a heating pad. ? Place a towel between your skin and the heat source. ? Leave the heat on for 20-30 minutes. ? Take off the heat if your skin turns bright red. This is especially important if you are unable to feel pain, heat, or cold. You may have a greater risk of getting burned.  Soak in a warm bath. This can help relieve pain.      Activity  Avoid bending and other activities that make pain worse.  When standing: ? Keep your upper back and neck straight. ? Keep your shoulders pulled back. ? Avoid slouching.  When sitting: ? Keep your back straight. ? Relax your shoulders. Do not round your shoulders or pull them backward.  Do not sit or stand in one place for long periods of time.  Take short rest breaks during the day. Lying down or standing is usually better than sitting. Resting can help relieve pain.  When sitting or lying down for a long time, do some mild activity or stretching. This will help to prevent stiffness and pain.  Get regular exercise. Ask your doctor what activities are safe for you.  Do not lift anything that is heavier than 10 lb (4.5 kg) or the limit that you are told, until your doctor says that it  is safe.  To prevent injury when you lift things: ? Bend your knees. ? Keep the weight close to your body. ? Avoid twisting.  Sleep on a firm mattress. Try lying on your side with your knees slightly bent. If you lie on your back, put a pillow under your knees.   Medicines  Treatment may include medicines for pain and swelling taken by mouth or put on the skin, prescription pain medicine, or muscle relaxants.  Take over-the-counter and prescription medicines only as told by your doctor.  Ask your doctor if the medicine prescribed to you: ? Requires you to avoid driving or using machinery. ? Can cause trouble pooping (constipation). You may need to take these actions to prevent or treat trouble pooping:  Drink enough fluid to keep your pee (urine) pale yellow.  Take over-the-counter or prescription medicines.  Eat foods that are high in fiber. These include beans, whole grains, and fresh fruits and vegetables.  Limit foods that are high in fat and sugars. These include fried or sweet foods. General instructions  Do not use any products that contain nicotine or tobacco, such as cigarettes, e-cigarettes, and chewing tobacco. If you need help quitting, ask your doctor.  Keep all follow-up visits as told by your doctor. This is important. Contact a doctor if:    Your pain does not get better with rest or medicine.  Your pain gets worse, or you have new pain.  You have a high fever.  You lose weight very quickly.  You have trouble doing your normal activities. Get help right away if:  One or both of your legs or feet feel weak.  One or both of your legs or feet lose feeling (have numbness).  You have trouble controlling when you poop (have a bowel movement) or pee (urinate).  You have bad back pain and: ? You feel like you may vomit (nauseous), or you vomit. ? You have pain in your belly (abdomen). ? You have shortness of breath. ? You faint. Summary  When back pain  lasts longer than 3 months, it is called chronic back pain.  Pain may get worse at certain times (flare-ups).  Use ice and heat as told by your doctor. Your doctor may tell you to use ice after flare-ups. This information is not intended to replace advice given to you by your health care provider. Make sure you discuss any questions you have with your health care provider. Document Revised: 01/12/2020 Document Reviewed: 01/12/2020 Elsevier Patient Education  2021 Elsevier Inc.  

## 2021-03-20 NOTE — Progress Notes (Signed)
   Subjective:    Patient ID: Marvin Espinoza, male    DOB: 1971-08-03, 50 y.o.   MRN: [id number]   Chief Complaint: Back Pain   HPI Patient come sin today c/o back pain. He has had back issues on and off for years. He has been on flexeril and baclofen and that die snot seem to help. MRI in the past has showed DDD. Has seen DR Nelva Bush  In the past. Rates pain today 8/10. Localized to lower back today but will occasional go to one side or the other and down back of leg. Sitting and walking on concrete increase pain. Nothing really helps it he says.    Review of Systems  Constitutional: Negative for diaphoresis.  Eyes: Negative for pain.  Respiratory: Negative for shortness of breath.   Cardiovascular: Negative for chest pain, palpitations and leg swelling.  Gastrointestinal: Negative for abdominal pain.  Endocrine: Negative for polydipsia.  Musculoskeletal: Positive for back pain.  Skin: Negative for rash.  Neurological: Negative for dizziness, weakness and headaches.  Hematological: Does not bruise/bleed easily.  All other systems reviewed and are negative.      Objective:   Physical Exam Vitals and nursing note reviewed.  Constitutional:      Appearance: Normal appearance.  Cardiovascular:     Rate and Rhythm: Normal rate and regular rhythm.     Heart sounds: Normal heart sounds.  Pulmonary:     Breath sounds: Normal breath sounds.  Musculoskeletal:     Comments: FROM of lymbar spine with pain on extension and rotation to the left (-) SLR on right (+) SLR on left at 90 degrees MOtor strenghth and sensation distally intact  Skin:    General: Skin is warm.  Neurological:     General: No focal deficit present.     Mental Status: He is alert.  Psychiatric:        Mood and Affect: Mood normal.        Behavior: Behavior normal.     BP 123/76   Pulse 67   Temp 97.8 F (36.6 C) (Temporal)   Resp 20   Ht 5\' 10"  (1.778 m)   Wt 249 lb (112.9 kg)   SpO2 99%   BMI 35.73  kg/m        Assessment & Plan:  Clevon Khader in today with chief complaint of Back Pain   1. DDD (degenerative disc disease), lumbar Moist heat rest - Ambulatory referral to Orthopedic Surgery - ketorolac (TORADOL) injection 60 mg  Meds ordered this encounter  Medications  . ketorolac (TORADOL) injection 60 mg  . predniSONE (DELTASONE) 20 MG tablet    Sig: 2 po at sametime daily for 5 days-    Dispense:  10 tablet    Refill:  0    Order Specific Question:   Supervising Provider    Answer:   Caryl Pina A [[id number]]     The above assessment and management plan was discussed with the patient. The patient verbalized understanding of and has agreed to the management plan. Patient is aware to call the clinic if symptoms persist or worsen. Patient is aware when to return to the clinic for a follow-up visit. Patient educated on when it is appropriate to go to the emergency department.   Mary-Margaret Hassell Done, FNP

## 2021-04-30 ENCOUNTER — Encounter: Payer: Self-pay | Admitting: Family Medicine

## 2021-04-30 ENCOUNTER — Other Ambulatory Visit: Payer: Self-pay

## 2021-04-30 ENCOUNTER — Ambulatory Visit: Payer: 59 | Admitting: Family Medicine

## 2021-04-30 VITALS — BP 131/65 | HR 64 | Temp 97.8°F | Ht 70.0 in | Wt 243.0 lb

## 2021-04-30 DIAGNOSIS — E039 Hypothyroidism, unspecified: Secondary | ICD-10-CM | POA: Diagnosis not present

## 2021-04-30 DIAGNOSIS — M545 Low back pain, unspecified: Secondary | ICD-10-CM | POA: Diagnosis not present

## 2021-04-30 DIAGNOSIS — E1169 Type 2 diabetes mellitus with other specified complication: Secondary | ICD-10-CM | POA: Diagnosis not present

## 2021-04-30 DIAGNOSIS — G8929 Other chronic pain: Secondary | ICD-10-CM

## 2021-04-30 DIAGNOSIS — Z9884 Bariatric surgery status: Secondary | ICD-10-CM

## 2021-04-30 LAB — BAYER DCA HB A1C WAIVED: HB A1C (BAYER DCA - WAIVED): 6.6 % (ref <7.0)

## 2021-04-30 MED ORDER — BACLOFEN 10 MG PO TABS: 5.0000 mg | ORAL_TABLET | Freq: Three times a day (TID) | ORAL | 0 refills | Status: DC | PRN

## 2021-04-30 MED ORDER — KETOROLAC TROMETHAMINE 30 MG/ML IJ SOLN
30.0000 mg | Freq: Once | INTRAMUSCULAR | Status: AC
  Administered 2021-04-30: 30 mg via INTRAMUSCULAR

## 2021-04-30 NOTE — Progress Notes (Signed)
Subjective: CC: Diet controlled DM PCP: Janora Norlander, DO YKZ:LDJTTSV Marvin Espinoza is a 50 y.o. male presenting to clinic today for:  1. Type 2 Diabetes, diet controlled/ h.o bariatric surgery He is compliant with his medications.  He admits to some weight gain since going back on olanzapine.  The olanzapine and Prozac are in fact improving his mood disorder but he does admit that he has had a little difficulty controlling his weight.  He is working on modification of diet and trying to stay as physically active as able.  He does have history of ruptured disc in his back and is currently struggling with that.  He got steroids and a dose of Toradol which did seem to help.  However, he does not want to take the steroids anymore because he has an upcoming injection and apparently this would interfere with that injection in June.  Last eye exam: Up-to-date Last foot exam: Up-to-date Last A1c:  Lab Results  Component Value Date   HGBA1C 5.9 12/29/2020   Nephropathy screen indicated?:  Up-to-date Last flu, zoster and/or pneumovax:  Immunization History  Administered Date(s) Administered  . Influenza,inj,Quad PF,6+ Mos 11/19/2016, 10/20/2017, 09/11/2018, 08/20/2019, 09/27/2020  . Influenza,inj,quad, With Preservative 12/16/2017  . Moderna Sars-Covid-2 Vaccination 03/04/2020, 04/05/2020, 10/09/2020  . Tdap 05/10/2020    2.  Hand swelling Patient reports that he intermittently gets what feels like hand swelling.  He has not overtly noticed any changes in the color of his hands but has not really paid attention.  It occurs and resolves without intervention.  He does report always being cold and during the episodes he is felt his pulse in his fingertips.   ROS: Per HPI  Allergies  Allergen Reactions  . Abilify [Aripiprazole] Anaphylaxis and Swelling    "slurred speech" FACIAL DROOPING, TONGUE SWELLING, TARDIVE DYSKENESIA   . Caplyta [Lumateperone] Anaphylaxis  . Seroquel [Quetiapine  Fumarate]     tardive dyskinesia  . Alcohol Itching    Drinking alcohol  . Ambien [Zolpidem] Other (See Comments)    NIGHT TERRORS  . Anacin-3 [Acetaminophen] Itching    REGULAR TYLENOL IS OKAY.  NEEDS BENADRYL TO TAKE THIS  . Cariprazine Other (See Comments)    Tardive dyskenesia  . Cariprazine Hcl Itching  . Latuda [Lurasidone Hcl] Other (See Comments)    RUNS SUGAR TOO HIGH  . Lurasidone Other (See Comments)    TARDIVE DYSKINESIA  . Prozac [Fluoxetine] Itching  . Victoza [Liraglutide] Other (See Comments)    Severe heartburn   . Bupropion Rash  . Hydrocodone-Acetaminophen Itching  . Risperidone Rash  . Tegretol [Carbamazepine] Rash   Past Medical History:  Diagnosis Date  . Anxiety   . Bipolar disorder ([place])   . CTS (carpal tunnel syndrome)   . Depression   . Diabetes mellitus without complication ([place])   . GERD (gastroesophageal reflux disease)   . H/O bariatric surgery 2016  . Head injury 05/06/2017  . Hypothyroidism   . Hypothyroidism   . Sleep apnea    no longer with OSA d/t over 100 pd weight loss    Current Outpatient Medications:  .  ALPRAZolam (XANAX) 1 MG tablet, 1 mg. Six times per day prn, Disp: , Rfl:  .  baclofen (LIORESAL) 10 MG tablet, Take 0.5-1 tablets (5-10 mg total) by mouth 3 (three) times daily as needed for muscle spasms., Disp: 30 each, Rfl: 0 .  Blood Glucose Monitoring Suppl (ONETOUCH VERIO) w/Device KIT, Use to test blood sugar daily as directed,  Disp: 1 kit, Rfl: 0 .  Calcium-Vitamin D-Vitamin K (SM CALCIUM SOFT CHEWS PO), Take 1 application by mouth 2 (two) times daily. Chew calcium tabs twice a day.  (bariatric surgery), Disp: , Rfl:  .  Cyanocobalamin (VITAMIN B-12) 5000 MCG TBDP, Take 1 tablet by mouth daily., Disp: , Rfl:  .  docusate sodium (COLACE) 100 MG capsule, Take 100 mg by mouth 2 (two) times daily., Disp: , Rfl:  .  glucose blood test strip, Use to test blood sugar daily as directed, Disp: 100 each, Rfl: 12 .  lamoTRIgine  (LAMICTAL) 200 MG tablet, Take 400 mg by mouth at bedtime. , Disp: , Rfl:  .  levothyroxine (SYNTHROID) 137 MCG tablet, TAKE 1 TABLET BY MOUTH DAILY BEFORE BREAKFAST, Disp: 90 tablet, Rfl: 2 .  metFORMIN (GLUCOPHAGE) 500 MG tablet, TAKE ONE TABLET TWICE A DAY WITH MEALS., Disp: 180 tablet, Rfl: 0 .  Multiple Vitamin (MULTIVITAMIN WITH MINERALS) TABS tablet, Take 1 tablet by mouth 2 (two) times daily. For Vitamin supplementation (Patient taking differently: Take 1 tablet by mouth 2 (two) times daily. For Vitamin supplementation FROM GASTRIC BYPASS), Disp: , Rfl:  .  OLANZapine (ZYPREXA) 5 MG tablet, Take 5 mg by mouth at bedtime., Disp: , Rfl:  .  OneTouch Delica Lancets 24M MISC, Use to test blood sugar daily as directed, Disp: 100 each, Rfl: 3 .  pantoprazole (PROTONIX) 40 MG tablet, Take 1 tablet (40 mg total) by mouth 2 (two) times daily. Failed Nexium, Omeprazole, Prevacid OTC, Pepcid, Zantac, Disp: 180 tablet, Rfl: 3 .  predniSONE (DELTASONE) 20 MG tablet, 2 po at sametime daily for 5 days-, Disp: 10 tablet, Rfl: 0 .  promethazine (PHENERGAN) 12.5 MG tablet, TAKE 1 TO 2 TABLETS EVERY 8 HOURS AS NEEDED FOR NAUSEA AND VOMITING, Disp: 60 tablet, Rfl: 1 .  psyllium (METAMUCIL) 58.6 % powder, Take 1 packet by mouth 2 (two) times daily., Disp: , Rfl:  .  vitamin B-12 (CYANOCOBALAMIN) 250 MCG tablet, Take 1 tablet (250 mcg total) by mouth daily. For low B-12 supplementation, Disp: 1 tablet, Rfl:  Social History   Socioeconomic History  . Marital status: Married    Spouse name: Mickel Baas  . Number of children: 1  . Years of education: Not on file  . Highest education level: Not on file  Occupational History  . Not on file  Tobacco Use  . Smoking status: Never Smoker  . Smokeless tobacco: Never Used  Vaping Use  . Vaping Use: Never used  Substance and Sexual Activity  . Alcohol use: No    Comment: severe allergy to alcohol!!  . Drug use: No  . Sexual activity: Yes  Other Topics Concern  . Not  on file  Social History Narrative   Patient lives in mothers' home. He stays alone until nighttime when wife comes up.    He stays there to keep an eye on the house.    His son comes to visit also.          Social Determinants of Health   Financial Resource Strain: Not on file  Food Insecurity: Not on file  Transportation Needs: Not on file  Physical Activity: Not on file  Stress: Not on file  Social Connections: Not on file  Intimate Partner Violence: Not on file   Family History  Problem Relation Age of Onset  . Diabetes Mother   . Heart disease Mother   . Depression Mother   . Esophageal cancer Mother   .  COPD Father   . Heart disease Father   . Kidney disease Father   . Mental illness Father   . Depression Father   . Mental illness Sister   . Depression Sister   . Esophageal cancer Maternal Uncle   . Colon cancer Neg Hx   . Rectal cancer Neg Hx   . Stomach cancer Neg Hx     Objective: Office vital signs reviewed. BP 131/65   Pulse 64   Temp 97.8 F (36.6 C)   Ht _0  (1.778 m)   Wt 243 lb (110.2 kg)   SpO2 99%   BMI 34.87 kg/m   Physical Examination:  General: Awake, alert, nontoxic, No acute distress Cardio: regular rate and rhythm, S1S2 heard, no murmurs appreciated Pulm: clear to auscultation bilaterally, no wheezes, rhonchi or rales; normal work of breathing on room air Extremities: warm, well perfused, No edema, cyanosis or clubbing; +2 pulses bilaterally MSK: No gross joint deformities or soft tissue swelling appreciated in the hands.  He is ambulating independently though gait is somewhat antalgic.  Assessment/ Plan: 50 y.o. male   Controlled type 2 diabetes mellitus with other specified complication, without long-term current use of insulin ([place]) - Plan: Lipid Panel, Bayer DCA Hb A1c Waived, Microalbumin / creatinine urine ratio  History of bariatric surgery  Chronic bilateral low back pain without sciatica - Plan: ketorolac (TORADOL) 30  MG/ML injection 30 mg, baclofen (LIORESAL) 10 MG tablet  Acquired hypothyroidism - Plan: TSH, T4, Free  Sugar is controlled though he has had a fairly large jump from A1c of last visit compared to today.  For that reason would like to see him in 3 months to make sure that we are stabilized.  Check TSH, free T4.  Thyroid not really focused on this exam.  He has acute on chronic back pain.  Toradol injection provided.  Baclofen renewed.  Follow-up with back specialist as scheduled  Experience in his hands I suspect is Raynaud's phenomenon.  I have asked him to pay special attention to this.  Handout was provided.  He will follow-up as needed on that issue  No orders of the defined types were placed in this encounter.  No orders of the defined types were placed in this encounter.    Janora Norlander, DO [place] [phone]

## 2021-04-30 NOTE — Patient Instructions (Signed)
 Raynaud Phenomenon  Raynaud phenomenon is a condition that affects the blood vessels (arteries) that carry blood to your fingers and toes. The arteries that supply blood to your ears, lips, nipples, or the tip of your nose might also be affected. Raynaud phenomenon causes the arteries to become narrow temporarily (spasm). As a result, the flow of blood to the affected areas is temporarily decreased. This usually occurs in response to cold temperatures or stress. During an attack, the skin in the affected areas turns white, then blue, and finally red. You may also feel tingling or numbness in those areas. Attacks usually last for only a brief period, and then the blood flow to the area returns to normal. In most cases, Raynaud phenomenon does not cause serious health problems. What are the causes? In many cases, the cause of this condition is not known. The condition may occur on its own (primary Raynaud phenomenon) or may be associated with other diseases or factors (secondary Raynaud phenomenon). Possible causes may include:  Diseases or medical conditions that damage the arteries.  Injuries and repetitive actions that hurt the hands or feet.  Being exposed to certain chemicals.  Taking medicines that narrow the arteries.  Other medical conditions, such as lupus, scleroderma, rheumatoid arthritis, thyroid problems, blood disorders, Sjogren syndrome, or atherosclerosis. What increases the risk? The following factors may make you more likely to develop this condition:  Being 20-40 years old.  Being male.  Having a family history of Raynaud phenomenon.  Living in a cold climate.  Smoking. What are the signs or symptoms? Symptoms of this condition usually occur when you are exposed to cold temperatures or when you have emotional stress. The symptoms may last for a few minutes or up to several hours. They usually affect your fingers but may also affect your toes, nipples, lips, ears,  or the tip of your nose. Symptoms may include:  Changes in skin color. The skin in the affected areas will turn pale or white. The skin may then change from white to bluish to red as normal blood flow returns to the area.  Numbness, tingling, or pain in the affected areas. In severe cases, symptoms may include:  Skin sores.  Tissues decaying and dying (gangrene). How is this diagnosed? This condition may be diagnosed based on:  Your symptoms and medical history.  A physical exam. During the exam, you may be asked to put your hands in cold water to check for a reaction to cold temperature.  Tests, such as: ? Blood tests to check for other diseases or conditions. ? A test to check the movement of blood through your arteries and veins (vascular ultrasound). ? A test in which the skin at the base of your fingernail is examined under a microscope (nailfold capillaroscopy). How is this treated? Treatment for this condition often involves making lifestyle changes and taking steps to control your exposure to cold temperatures. For more severe cases, medicine (calcium channel blockers) may be used to improve blood flow. Surgery is sometimes done to block the nerves that control the affected arteries, but this is rare. Follow these instructions at home: Avoiding cold temperatures Take these steps to avoid exposure to cold:  If possible, stay indoors during cold weather.  When you go outside during cold weather, dress in layers and wear mittens, a hat, a scarf, and warm footwear.  Wear mittens or gloves when handling ice or frozen food.  Use holders for glasses or cans containing cold drinks.    Let warm water run for a while before taking a shower or bath.  Warm up the car before driving in cold weather. Lifestyle  If possible, avoid stressful and emotional situations. Try to find ways to manage your stress, such as: ? Exercise. ? Yoga. ? Meditation. ? Biofeedback.  Do not use any  products that contain nicotine or tobacco, such as cigarettes and e-cigarettes. If you need help quitting, ask your health care provider.  Avoid secondhand smoke.  Limit your use of caffeine. ? Switch to decaffeinated coffee, tea, and soda. ? Avoid chocolate.  Avoid vibrating tools and machinery. General instructions  Protect your hands and feet from injuries, cuts, or bruises.  Avoid wearing tight rings or wristbands.  Wear loose fitting socks and comfortable, roomy shoes.  Take over-the-counter and prescription medicines only as told by your health care provider. Contact a health care provider if:  Your discomfort becomes worse despite lifestyle changes.  You develop sores on your fingers or toes that do not heal.  Your fingers or toes turn black.  You have breaks in the skin on your fingers or toes.  You have a fever.  You have pain or swelling in your joints.  You have a rash.  Your symptoms occur on only one side of your body. Summary  Raynaud phenomenon is a condition that affects the arteries that carry blood to your fingers, toes, ears, lips, nipples, or the tip of your nose.  In many cases, the cause of this condition is not known.  Symptoms of this condition include changes in skin color, and numbness and tingling of the affected area.  Treatment for this condition includes lifestyle changes, reducing exposure to cold temperatures, and using medicines for severe cases of the condition.  Contact your health care provider if your condition worsens despite treatment. This information is not intended to replace advice given to you by your health care provider. Make sure you discuss any questions you have with your health care provider. Document Revised: 04/13/2020 Document Reviewed: 04/13/2020 Elsevier Patient Education  2021 Elsevier Inc.  

## 2021-05-01 ENCOUNTER — Other Ambulatory Visit: Payer: Self-pay

## 2021-05-01 LAB — LIPID PANEL
Chol/HDL Ratio: 3 ratio (ref 0.0–5.0)
Cholesterol, Total: 141 mg/dL (ref 100–199)
HDL: 47 mg/dL (ref >39)
LDL Chol Calc (NIH): 79 mg/dL (ref 0–99)
Triglycerides: 75 mg/dL (ref 0–149)
VLDL Cholesterol Cal: 15 mg/dL (ref 5–40)

## 2021-05-01 LAB — TSH: TSH: 0.627 u[IU]/mL (ref 0.450–4.500)

## 2021-05-01 LAB — T4, FREE: Free T4: 1.7 ng/dL (ref 0.82–1.77)

## 2021-05-01 LAB — MICROALBUMIN / CREATININE URINE RATIO
Creatinine, Urine: 11.9 mg/dL
Microalb/Creat Ratio: <25 mg/g creat (ref 0–29)
Microalbumin, Urine: <3 ug/mL

## 2021-05-01 MED ORDER — ROSUVASTATIN CALCIUM 5 MG PO TABS: 5.0000 mg | ORAL_TABLET | Freq: Every day | ORAL | 3 refills | Status: DC

## 2021-05-02 ENCOUNTER — Telehealth: Payer: Self-pay | Admitting: Family Medicine

## 2021-05-02 NOTE — Telephone Encounter (Signed)
Kind of out of options since he can't take NSAIDs and they said no prednisone before appointment.  If he wants to come for another toradol 30 IM, I'm ok with that.

## 2021-05-02 NOTE — Telephone Encounter (Signed)
Still taking baclofen 10 TID  - not helping  Low back pain.  Has a injection scheduled for 6/2 - is there anything he can take for relief until then   [facility] [place], [place]

## 2021-05-02 NOTE — Telephone Encounter (Signed)
Called pt - will do toradol 30 injection tomorrow - triage nurse visit scheduled for 5/19 at 400 pm

## 2021-05-03 ENCOUNTER — Ambulatory Visit (INDEPENDENT_AMBULATORY_CARE_PROVIDER_SITE_OTHER): Payer: 59

## 2021-05-03 ENCOUNTER — Other Ambulatory Visit: Payer: Self-pay

## 2021-05-03 DIAGNOSIS — M545 Low back pain, unspecified: Secondary | ICD-10-CM

## 2021-05-03 DIAGNOSIS — G8929 Other chronic pain: Secondary | ICD-10-CM

## 2021-05-03 MED ORDER — KETOROLAC TROMETHAMINE 30 MG/ML IJ SOLN
30.0000 mg | Freq: Once | INTRAMUSCULAR | Status: AC
  Administered 2021-05-03: 30 mg via INTRAMUSCULAR

## 2021-05-03 NOTE — Progress Notes (Signed)
Toradol injection given to right upper outer quadrant.  Patient tolerated well.

## 2021-05-18 ENCOUNTER — Telehealth: Payer: Self-pay | Admitting: Family Medicine

## 2021-05-18 NOTE — Telephone Encounter (Signed)
No pian meds can be given without being seen in face to face visit

## 2021-05-18 NOTE — Telephone Encounter (Signed)
Please advise covering pcp inj was Toradol

## 2021-05-23 NOTE — Telephone Encounter (Signed)
Has up coming appointment with PCP- this encounter will be closed.

## 2021-06-16 ENCOUNTER — Other Ambulatory Visit: Payer: Self-pay | Admitting: Family Medicine

## 2021-08-01 ENCOUNTER — Encounter: Payer: Self-pay | Admitting: Family Medicine

## 2021-08-01 ENCOUNTER — Ambulatory Visit: Payer: 59 | Admitting: Family Medicine

## 2021-08-01 ENCOUNTER — Other Ambulatory Visit: Payer: Self-pay

## 2021-08-01 VITALS — BP 130/88 | HR 63 | Temp 98.4°F | Ht 70.0 in | Wt 255.4 lb

## 2021-08-01 DIAGNOSIS — E1165 Type 2 diabetes mellitus with hyperglycemia: Secondary | ICD-10-CM | POA: Diagnosis not present

## 2021-08-01 DIAGNOSIS — Z23 Encounter for immunization: Secondary | ICD-10-CM | POA: Diagnosis not present

## 2021-08-01 DIAGNOSIS — F332 Major depressive disorder, recurrent severe without psychotic features: Secondary | ICD-10-CM

## 2021-08-01 DIAGNOSIS — M5136 Other intervertebral disc degeneration, lumbar region: Secondary | ICD-10-CM | POA: Diagnosis not present

## 2021-08-01 DIAGNOSIS — Z5181 Encounter for therapeutic drug level monitoring: Secondary | ICD-10-CM | POA: Diagnosis not present

## 2021-08-01 DIAGNOSIS — E039 Hypothyroidism, unspecified: Secondary | ICD-10-CM | POA: Diagnosis not present

## 2021-08-01 DIAGNOSIS — E119 Type 2 diabetes mellitus without complications: Secondary | ICD-10-CM | POA: Insufficient documentation

## 2021-08-01 LAB — CBC
Hematocrit: 42.2 % (ref 37.5–51.0)
Hemoglobin: 14 g/dL (ref 13.0–17.7)
MCH: 28.7 pg (ref 26.6–33.0)
MCHC: 33.2 g/dL (ref 31.5–35.7)
MCV: 87 fL (ref 79–97)
Platelets: 253 10*3/uL (ref 150–450)
RBC: 4.88 x10E6/uL (ref 4.14–5.80)
RDW: 13.2 % (ref 11.6–15.4)
WBC: 6.7 10*3/uL (ref 3.4–10.8)

## 2021-08-01 LAB — BAYER DCA HB A1C WAIVED: HB A1C (BAYER DCA - WAIVED): 8.3 % — ABNORMAL HIGH (ref <7.0)

## 2021-08-01 MED ORDER — TIZANIDINE HCL 4 MG PO TABS: 4.0000 mg | ORAL_TABLET | Freq: Three times a day (TID) | ORAL | 1 refills | Status: DC | PRN

## 2021-08-01 MED ORDER — METFORMIN HCL 1000 MG PO TABS: 1000.0000 mg | ORAL_TABLET | Freq: Two times a day (BID) | ORAL | 3 refills | Status: DC

## 2021-08-01 NOTE — Progress Notes (Signed)
Subjective: CC: Follow-up diabetes PCP: Janora Norlander, DO Marvin Espinoza is a 50 y.o. male presenting to clinic today for:  1. .Type 2 Diabetes with hypertension, hyperlipidemia:  He admits to a lot of weight gain since he was on the olanzapine.  He was recently switched to what he thinks was Tegretol.  Continue on Prozac.  He is working on weight loss with so far 9 pound successful weight loss.  He admits to craving sugar.  Does not report any chest pain, shortness of breath or change in vision.  He is currently taking metformin 500 mg daily, Crestor 5 mg daily.  Not treated for hypertension as this has been diet controlled in the setting of gastric bypass.  Last eye exam: needs Last foot exam: needs Last A1c: 6.6 on 04/30/21  Nephropathy screen indicated?: no Last flu, zoster and/or pneumovax:  Immunization History  Administered Date(s) Administered   Influenza,inj,Quad PF,6+ Mos 11/19/2016, 10/20/2017, 09/11/2018, 08/20/2019, 09/27/2020   Influenza,inj,quad, With Preservative 12/16/2017   Moderna Sars-Covid-2 Vaccination 03/04/2020, 04/05/2020, 10/09/2020   Tdap 05/10/2020   Zoster Recombinat (Shingrix) 08/01/2021    2.  Major depressive disorder, bipolar disorder Patient is compliant with medications as prescribed.  He has recent changes as above.  He admits to weight gain as above.  Symptoms are under fair control.  Currently working with psychiatry , with an appointment scheduled soon.   ROS: Per HPI  Allergies  Allergen Reactions   Abilify [Aripiprazole] Anaphylaxis and Swelling    "slurred speech" FACIAL DROOPING, TONGUE SWELLING, TARDIVE DYSKENESIA    Caplyta [Lumateperone] Anaphylaxis   Seroquel [Quetiapine Fumarate]     tardive dyskinesia   Alcohol Itching    Drinking alcohol   Ambien [Zolpidem] Other (See Comments)    NIGHT TERRORS   Anacin-3 [Acetaminophen] Itching    REGULAR TYLENOL IS OKAY.  NEEDS BENADRYL TO TAKE THIS   Cariprazine Other (See  Comments)    Tardive dyskenesia   Cariprazine Hcl Itching   Latuda [Lurasidone Hcl] Other (See Comments)    RUNS SUGAR TOO HIGH   Lurasidone Other (See Comments)    TARDIVE DYSKINESIA   Prozac [Fluoxetine] Itching   Victoza [Liraglutide] Other (See Comments)    Severe heartburn    Bupropion Rash   Hydrocodone-Acetaminophen Itching   Risperidone Rash   Tegretol [Carbamazepine] Rash   Past Medical History:  Diagnosis Date   Anxiety    Bipolar disorder ([place])    CTS (carpal tunnel syndrome)    Depression    Diabetes mellitus without complication ([place])    GERD (gastroesophageal reflux disease)    H/O bariatric surgery 2016   Head injury 05/06/2017   Hypothyroidism    Hypothyroidism    Sleep apnea    no longer with OSA d/t over 100 pd weight loss    Current Outpatient Medications:    ALPRAZolam (XANAX) 1 MG tablet, 1 mg. Six times per day prn, Disp: , Rfl:    baclofen (LIORESAL) 10 MG tablet, Take 0.5-1 tablets (5-10 mg total) by mouth 3 (three) times daily as needed for muscle spasms., Disp: 30 each, Rfl: 0   Blood Glucose Monitoring Suppl (ONETOUCH VERIO) w/Device KIT, Use to test blood sugar daily as directed, Disp: 1 kit, Rfl: 0   Calcium-Vitamin D-Vitamin K (SM CALCIUM SOFT CHEWS PO), Take 1 application by mouth 2 (two) times daily. Chew calcium tabs twice a day.  (bariatric surgery), Disp: , Rfl:    Cyanocobalamin (VITAMIN B-12) 5000 MCG TBDP, Take 1  tablet by mouth daily., Disp: , Rfl:    docusate sodium (COLACE) 100 MG capsule, Take 100 mg by mouth 2 (two) times daily., Disp: , Rfl:    FLUoxetine (PROZAC) 20 MG tablet, Take 20 mg by mouth daily., Disp: , Rfl:    gabapentin (NEURONTIN) 300 MG capsule, Take 300 mg by mouth 3 (three) times daily., Disp: , Rfl:    glucose blood test strip, Use to test blood sugar daily as directed, Disp: 100 each, Rfl: 12   lamoTRIgine (LAMICTAL) 200 MG tablet, Take 400 mg by mouth at bedtime. , Disp: , Rfl:    levothyroxine (SYNTHROID) 137  MCG tablet, TAKE 1 TABLET BY MOUTH DAILY BEFORE BREAKFAST, Disp: 90 tablet, Rfl: 2   metFORMIN (GLUCOPHAGE) 500 MG tablet, TAKE ONE TABLET TWICE A DAY WITH MEALS., Disp: 180 tablet, Rfl: 0   Multiple Vitamin (MULTIVITAMIN WITH MINERALS) TABS tablet, Take 1 tablet by mouth 2 (two) times daily. For Vitamin supplementation (Patient taking differently: Take 1 tablet by mouth 2 (two) times daily. For Vitamin supplementation FROM GASTRIC BYPASS), Disp: , Rfl:    OneTouch Delica Lancets 80H MISC, Use to test blood sugar daily as directed, Disp: 100 each, Rfl: 3   pantoprazole (PROTONIX) 40 MG tablet, Take 1 tablet (40 mg total) by mouth 2 (two) times daily. Failed Nexium, Omeprazole, Prevacid OTC, Pepcid, Zantac, Disp: 180 tablet, Rfl: 3   predniSONE (DELTASONE) 20 MG tablet, 2 po at sametime daily for 5 days-, Disp: 10 tablet, Rfl: 0   promethazine (PHENERGAN) 12.5 MG tablet, TAKE 1 TO 2 TABLETS EVERY 8 HOURS AS NEEDED FOR NAUSEA AND VOMITING, Disp: 60 tablet, Rfl: 1   psyllium (METAMUCIL) 58.6 % powder, Take 1 packet by mouth 2 (two) times daily., Disp: , Rfl:    rosuvastatin (CRESTOR) 5 MG tablet, Take 1 tablet (5 mg total) by mouth daily., Disp: 90 tablet, Rfl: 3   tiZANidine (ZANAFLEX) 4 MG tablet, Take 4 mg by mouth every 6 (six) hours as needed for muscle spasms., Disp: , Rfl:    vitamin B-12 (CYANOCOBALAMIN) 250 MCG tablet, Take 1 tablet (250 mcg total) by mouth daily. For low B-12 supplementation, Disp: 1 tablet, Rfl:  Social History   Socioeconomic History   Marital status: Married    Spouse name: Mickel Baas   Number of children: 1   Years of education: Not on file   Highest education level: Not on file  Occupational History   Not on file  Tobacco Use   Smoking status: Never   Smokeless tobacco: Never  Vaping Use   Vaping Use: Never used  Substance and Sexual Activity   Alcohol use: No    Comment: severe allergy to alcohol!!   Drug use: No   Sexual activity: Yes  Other Topics Concern    Not on file  Social History Narrative   Patient lives in mothers' home. He stays alone until nighttime when wife comes up.    He stays there to keep an eye on the house.    His son comes to visit also.          Social Determinants of Health   Financial Resource Strain: Not on file  Food Insecurity: Not on file  Transportation Needs: Not on file  Physical Activity: Not on file  Stress: Not on file  Social Connections: Not on file  Intimate Partner Violence: Not on file   Family History  Problem Relation Age of Onset   Diabetes Mother  Heart disease Mother    Depression Mother    Esophageal cancer Mother    COPD Father    Heart disease Father    Kidney disease Father    Mental illness Father    Depression Father    Mental illness Sister    Depression Sister    Esophageal cancer Maternal Uncle    Colon cancer Neg Hx    Rectal cancer Neg Hx    Stomach cancer Neg Hx     Objective: Office vital signs reviewed. BP 130/88   Pulse 63   Temp 98.4 F (36.9 C)   Ht '5\' 10"'  (1.778 m)   Wt 255 lb 6.4 oz (115.8 kg)   SpO2 98%   BMI 36.65 kg/m   Physical Examination:  General: Awake, alert, obese, No acute distress HEENT: Normal; sclera white.  No exophthalmos.  No goiter.  No carotid bruits Cardio: regular rate and rhythm, S1S2 heard, no murmurs appreciated Pulm: clear to auscultation bilaterally, no wheezes, rhonchi or rales; normal work of breathing on room air Psych: Mood is stable.  Patient is pleasant.  Lab Results  Component Value Date   HGBA1C 8.3 (H) 08/01/2021    Assessment/ Plan: 50 y.o. male   Uncontrolled type 2 diabetes mellitus with hyperglycemia (HCC) - Plan: Bayer DCA Hb A1c Waived, CMP14+EGFR, metFORMIN (GLUCOPHAGE) 1000 MG tablet  DDD (degenerative disc disease), lumbar - Plan: tiZANidine (ZANAFLEX) 4 MG tablet  Medication monitoring encounter - Plan: CBC  Acquired hypothyroidism - Plan: TSH, T4, Free  MDD (major depressive disorder),  recurrent severe, without psychosis ([place])  Sugar now uncontrolled with A1c rising to 8.3.  Increase metformin to 1000 mg twice daily.  Would like to see him back in 3 months for recheck.  Needs diabetic foot exam and diabetic eye exam performed  Zanaflex renewed.  Continue to follow-up with Dr. Nelva Bush as needed  Check CBC given use of Lamictal  Having issues with weight.  Check TSH, free T4  Continue following up with psychiatry for MDD.  His wife will contact me with the exact dosing the name of his medications.  Orders Placed This Encounter  Procedures   Bayer DCA Hb A1c Waived   CMP14+EGFR   Meds ordered this encounter  Medications   tiZANidine (ZANAFLEX) 4 MG tablet    Sig: Take 1 tablet (4 mg total) by mouth every 8 (eight) hours as needed for muscle spasms.    Dispense:  30 tablet    Refill:  1   metFORMIN (GLUCOPHAGE) 1000 MG tablet    Sig: Take 1 tablet (1,000 mg total) by mouth 2 (two) times daily with a meal.    Dispense:  180 tablet    Refill:  [place], [place] [phone]

## 2021-08-01 NOTE — Patient Instructions (Signed)
You had labs performed today.  You will be contacted with the results of the labs once they are available, usually in the next 3 business days for routine lab work.  If you have an active my chart account, they will be released to your MyChart.  If you prefer to have these labs released to you via telephone, please let us know.  Don't forget to call me with medication and doses.

## 2021-08-02 ENCOUNTER — Other Ambulatory Visit: Payer: Self-pay | Admitting: Family Medicine

## 2021-08-02 DIAGNOSIS — E039 Hypothyroidism, unspecified: Secondary | ICD-10-CM

## 2021-08-02 LAB — CMP14+EGFR
ALT: 31 IU/L (ref 0–44)
AST: 24 IU/L (ref 0–40)
Albumin/Globulin Ratio: 2.8 — ABNORMAL HIGH (ref 1.2–2.2)
Albumin: 4.8 g/dL (ref 3.8–4.9)
Alkaline Phosphatase: 83 IU/L (ref 44–121)
BUN/Creatinine Ratio: 13 (ref 9–20)
BUN: 14 mg/dL (ref 6–24)
Bilirubin Total: 0.2 mg/dL (ref 0.0–1.2)
CO2: 24 mmol/L (ref 20–29)
Calcium: 9.8 mg/dL (ref 8.7–10.2)
Chloride: 100 mmol/L (ref 96–106)
Creatinine, Ser: 1.04 mg/dL (ref 0.76–1.27)
Globulin, Total: 1.7 g/dL (ref 1.5–4.5)
Glucose: 157 mg/dL — ABNORMAL HIGH (ref 65–99)
Potassium: 4.7 mmol/L (ref 3.5–5.2)
Sodium: 141 mmol/L (ref 134–144)
Total Protein: 6.5 g/dL (ref 6.0–8.5)
eGFR: 87 mL/min/{1.73_m2} (ref >59)

## 2021-08-02 LAB — TSH: TSH: 9.42 u[IU]/mL — ABNORMAL HIGH (ref 0.450–4.500)

## 2021-08-02 LAB — T4, FREE: Free T4: 1.16 ng/dL (ref 0.82–1.77)

## 2021-08-02 MED ORDER — LEVOTHYROXINE SODIUM 150 MCG PO TABS
150.0000 ug | ORAL_TABLET | Freq: Every day | ORAL | 3 refills | Status: DC
Start: 2021-08-02 — End: 2021-11-23

## 2021-08-21 ENCOUNTER — Encounter: Payer: Self-pay | Admitting: Family Medicine

## 2021-08-22 ENCOUNTER — Other Ambulatory Visit: Payer: Self-pay | Admitting: Family Medicine

## 2021-08-22 DIAGNOSIS — E039 Hypothyroidism, unspecified: Secondary | ICD-10-CM

## 2021-08-23 ENCOUNTER — Other Ambulatory Visit: Payer: 59

## 2021-08-23 DIAGNOSIS — E039 Hypothyroidism, unspecified: Secondary | ICD-10-CM

## 2021-08-24 ENCOUNTER — Other Ambulatory Visit: Payer: Self-pay | Admitting: Family Medicine

## 2021-08-24 ENCOUNTER — Other Ambulatory Visit: Payer: Self-pay

## 2021-08-24 DIAGNOSIS — E039 Hypothyroidism, unspecified: Secondary | ICD-10-CM

## 2021-08-24 LAB — TSH: TSH: 9.34 u[IU]/mL — ABNORMAL HIGH (ref 0.450–4.500)

## 2021-08-24 LAB — T4, FREE: Free T4: 0.95 ng/dL (ref 0.82–1.77)

## 2021-09-04 ENCOUNTER — Ambulatory Visit (INDEPENDENT_AMBULATORY_CARE_PROVIDER_SITE_OTHER): Payer: 59 | Admitting: Family Medicine

## 2021-09-04 DIAGNOSIS — J069 Acute upper respiratory infection, unspecified: Secondary | ICD-10-CM | POA: Diagnosis not present

## 2021-09-04 LAB — VERITOR FLU A/B WAIVED
Influenza A: NEGATIVE
Influenza B: NEGATIVE

## 2021-09-04 MED ORDER — AZELASTINE HCL 0.1 % NA SOLN: 1.0000 | Freq: Two times a day (BID) | NASAL | 12 refills | Status: DC

## 2021-09-04 MED ORDER — MAGIC MOUTHWASH W/LIDOCAINE: ORAL | 0 refills | Status: DC

## 2021-09-04 MED ORDER — LORATADINE 10 MG PO TABS: 10.0000 mg | ORAL_TABLET | Freq: Every day | ORAL | 11 refills | Status: DC

## 2021-09-04 MED ORDER — MAGIC MOUTHWASH W/LIDOCAINE
ORAL | 0 refills | Status: DC
Start: 2021-09-04 — End: 2021-09-04

## 2021-09-04 NOTE — Progress Notes (Signed)
Telephone visit  Subjective: CC:URI PCP: Janora Norlander, DO [id number] Marvin Espinoza is a 50 y.o. male calls for telephone consult today. Patient provides verbal consent for consult held via phone.  Due to COVID-19 pandemic this visit was conducted virtually. This visit type was conducted due to national recommendations for restrictions regarding the COVID-19 Pandemic (e.g. social distancing, sheltering in place) in an effort to limit this patient's exposure and mitigate transmission in our community. All issues noted in this document were discussed and addressed.  A physical exam was not performed with this format.   Location of patient: home Location of provider: [facility] Others present for call: none  1. URI Onset Friday.  Reports sore throat, post nasal drip, dizzy and generalized weakness.  Some headaches.  No pus from nares.  Taking Dayquil, Nyquil, pseudofed.  Had home COVID test and was negative.  No PCR test yet.  ROS: Per HPI  Allergies  Allergen Reactions   Abilify [Aripiprazole] Anaphylaxis and Swelling    "slurred speech" FACIAL DROOPING, TONGUE SWELLING, TARDIVE DYSKENESIA    Caplyta [Lumateperone] Anaphylaxis   Seroquel [Quetiapine Fumarate]     tardive dyskinesia   Alcohol Itching    Drinking alcohol   Ambien [Zolpidem] Other (See Comments)    NIGHT TERRORS   Anacin-3 [Acetaminophen] Itching    REGULAR TYLENOL IS OKAY.  NEEDS BENADRYL TO TAKE THIS   Cariprazine Other (See Comments)    Tardive dyskenesia   Cariprazine Hcl Itching   Latuda [Lurasidone Hcl] Other (See Comments)    RUNS SUGAR TOO HIGH   Lurasidone Other (See Comments)    TARDIVE DYSKINESIA   Prozac [Fluoxetine] Itching   Victoza [Liraglutide] Other (See Comments)    Severe heartburn    Bupropion Rash   Hydrocodone-Acetaminophen Itching   Risperidone Rash   Tegretol [Carbamazepine] Rash   Past Medical History:  Diagnosis Date   Anxiety    Bipolar disorder ([place])    CTS (carpal tunnel  syndrome)    Depression    Diabetes mellitus without complication ([place])    GERD (gastroesophageal reflux disease)    H/O bariatric surgery 2016   Head injury 05/06/2017   Hypothyroidism    Hypothyroidism    Sleep apnea    no longer with OSA d/t over 100 pd weight loss    Current Outpatient Medications:    ALPRAZolam (XANAX) 1 MG tablet, 1 mg. Six times per day prn, Disp: , Rfl:    Blood Glucose Monitoring Suppl (ONETOUCH VERIO) w/Device KIT, Use to test blood sugar daily as directed, Disp: 1 kit, Rfl: 0   Calcium-Vitamin D-Vitamin K (SM CALCIUM SOFT CHEWS PO), Take 1 application by mouth 2 (two) times daily. Chew calcium tabs twice a day.  (bariatric surgery), Disp: , Rfl:    Cyanocobalamin (VITAMIN B-12) 5000 MCG TBDP, Take 1 tablet by mouth daily., Disp: , Rfl:    docusate sodium (COLACE) 100 MG capsule, Take 100 mg by mouth 2 (two) times daily., Disp: , Rfl:    FLUoxetine (PROZAC) 20 MG tablet, Take 20 mg by mouth daily., Disp: , Rfl:    gabapentin (NEURONTIN) 300 MG capsule, Take 300 mg by mouth 3 (three) times daily., Disp: , Rfl:    glucose blood test strip, Use to test blood sugar daily as directed, Disp: 100 each, Rfl: 12   lamoTRIgine (LAMICTAL) 200 MG tablet, Take 400 mg by mouth at bedtime. , Disp: , Rfl:    levothyroxine (SYNTHROID) 150 MCG tablet, Take 1 tablet (150 mcg  total) by mouth daily., Disp: 90 tablet, Rfl: 3   metFORMIN (GLUCOPHAGE) 1000 MG tablet, Take 1 tablet (1,000 mg total) by mouth 2 (two) times daily with a meal., Disp: 180 tablet, Rfl: 3   Multiple Vitamin (MULTIVITAMIN WITH MINERALS) TABS tablet, Take 1 tablet by mouth 2 (two) times daily. For Vitamin supplementation (Patient taking differently: Take 1 tablet by mouth 2 (two) times daily. For Vitamin supplementation FROM GASTRIC BYPASS), Disp: , Rfl:    OneTouch Delica Lancets 01T MISC, Use to test blood sugar daily as directed, Disp: 100 each, Rfl: 3   pantoprazole (PROTONIX) 40 MG tablet, Take 1 tablet (40 mg  total) by mouth 2 (two) times daily. Failed Nexium, Omeprazole, Prevacid OTC, Pepcid, Zantac, Disp: 180 tablet, Rfl: 3   predniSONE (DELTASONE) 20 MG tablet, 2 po at sametime daily for 5 days-, Disp: 10 tablet, Rfl: 0   promethazine (PHENERGAN) 12.5 MG tablet, TAKE 1 TO 2 TABLETS EVERY 8 HOURS AS NEEDED FOR NAUSEA AND VOMITING, Disp: 60 tablet, Rfl: 1   psyllium (METAMUCIL) 58.6 % powder, Take 1 packet by mouth 2 (two) times daily., Disp: , Rfl:    rosuvastatin (CRESTOR) 5 MG tablet, Take 1 tablet (5 mg total) by mouth daily., Disp: 90 tablet, Rfl: 3   tiZANidine (ZANAFLEX) 4 MG tablet, Take 1 tablet (4 mg total) by mouth every 8 (eight) hours as needed for muscle spasms., Disp: 30 tablet, Rfl: 1   vitamin B-12 (CYANOCOBALAMIN) 250 MCG tablet, Take 1 tablet (250 mcg total) by mouth daily. For low B-12 supplementation, Disp: 1 tablet, Rfl:   Assessment/ Plan: 50 y.o. male   Viral upper respiratory tract infection - Plan: azelastine (ASTELIN) 0.1 % nasal spray, loratadine (CLARITIN) 10 MG tablet, Novel Coronavirus, NAA ([facility]), Veritor Flu A/B Waived, magic mouthwash w/lidocaine SOLN, DISCONTINUED: magic mouthwash w/lidocaine SOLN, DISCONTINUED: magic mouthwash w/lidocaine SOLN  Like him to be checked for COVID and for influenza given symptoms and sensation of generalized weakness.  Encourage p.o. fluids.  Supportive medications have been sent to the local pharmacy.  Home care instructions reviewed and reasons for emergent evaluation discussed.  He voiced understanding will follow up as needed  Start time: 10:26am End time: 10:31am  Total time spent on patient care (including telephone call/ virtual visit): 5 minutes  [place], [place] [phone]

## 2021-09-05 LAB — SARS-COV-2, NAA 2 DAY TAT

## 2021-09-05 LAB — NOVEL CORONAVIRUS, NAA: SARS-CoV-2, NAA: NOT DETECTED

## 2021-09-07 ENCOUNTER — Ambulatory Visit (INDEPENDENT_AMBULATORY_CARE_PROVIDER_SITE_OTHER): Payer: 59 | Admitting: Family Medicine

## 2021-09-07 ENCOUNTER — Encounter: Payer: Self-pay | Admitting: Family Medicine

## 2021-09-07 ENCOUNTER — Telehealth: Payer: Self-pay | Admitting: Family Medicine

## 2021-09-07 VITALS — BP 130/77 | HR 60 | Temp 97.8°F

## 2021-09-07 DIAGNOSIS — J069 Acute upper respiratory infection, unspecified: Secondary | ICD-10-CM

## 2021-09-07 MED ORDER — AMOXICILLIN-POT CLAVULANATE 875-125 MG PO TABS: ORAL_TABLET | ORAL | 0 refills | Status: DC

## 2021-09-07 NOTE — Progress Notes (Signed)
Acute Office Visit  Subjective:    Patient ID: Marvin Espinoza, male    DOB: [birth year], 50 y.o.   MRN: [id number]  Chief Complaint  Patient presents with   Fatigue    HPI Patient is in today for fatigue x 1 week. He feels like he has no energy. He was seen by his PCP when his symptoms started and diagnosed with a viral URI. He also had a sore throat but this has resolved. He reports congestion with postnasal drip. Denies chest pain, shortness of breath, nausea, vomiting, diarrhea. He has been taking dayquil, nyquild, sudafed, claritin, and azelastine, and mucnex with little improvement. He had a negative Covid test.   Past Medical History:  Diagnosis Date   Anxiety    Bipolar disorder ([place])    CTS (carpal tunnel syndrome)    Depression    Diabetes mellitus without complication ([place])    GERD (gastroesophageal reflux disease)    H/O bariatric surgery 2016   Head injury 05/06/2017   Hypothyroidism    Hypothyroidism    Sleep apnea    no longer with OSA d/t over 100 pd weight loss    Past Surgical History:  Procedure Laterality Date   ANAL FISSURE REPAIR     BARIATRIC SURGERY     CARPAL TUNNEL RELEASE Left 05/09/2015   Procedure: LEFT CARPAL TUNNEL RELEASE;  Surgeon: Daryll Brod, MD;  Location: [facility];  Service: Orthopedics;  Laterality: Left;   CARPAL TUNNEL RELEASE Left 05-09-2015   CARPAL TUNNEL RELEASE Right 06/20/2015   Procedure: RIGHT CARPAL TUNNEL RELEASE;  Surgeon: Daryll Brod, MD;  Location: [facility];  Service: Orthopedics;  Laterality: Right;  REGIONAL/FAB   CHOLECYSTECTOMY     COLONOSCOPY     UPPER GASTROINTESTINAL ENDOSCOPY      Family History  Problem Relation Age of Onset   Diabetes Mother    Heart disease Mother    Depression Mother    Esophageal cancer Mother    COPD Father    Heart disease Father    Kidney disease Father    Mental illness Father    Depression Father    Mental illness Sister    Depression Sister     Esophageal cancer Maternal Uncle    Colon cancer Neg Hx    Rectal cancer Neg Hx    Stomach cancer Neg Hx     Social History   Socioeconomic History   Marital status: Married    Spouse name: Mickel Baas   Number of children: 1   Years of education: Not on file   Highest education level: Not on file  Occupational History   Not on file  Tobacco Use   Smoking status: Never   Smokeless tobacco: Never  Vaping Use   Vaping Use: Never used  Substance and Sexual Activity   Alcohol use: No    Comment: severe allergy to alcohol!!   Drug use: No   Sexual activity: Yes  Other Topics Concern   Not on file  Social History Narrative   Patient lives in mothers' home. He stays alone until nighttime when wife comes up.    He stays there to keep an eye on the house.    His son comes to visit also.          Social Determinants of Health   Financial Resource Strain: Not on file  Food Insecurity: Not on file  Transportation Needs: Not on file  Physical Activity: Not on file  Stress: Not on file  Social Connections: Not on file  Intimate Partner Violence: Not on file    Outpatient Medications Prior to Visit  Medication Sig Dispense Refill   ALPRAZolam (XANAX) 1 MG tablet 1 mg. Six times per day prn     azelastine (ASTELIN) 0.1 % nasal spray Place 1 spray into both nostrils 2 (two) times daily. 30 mL 12   Blood Glucose Monitoring Suppl (ONETOUCH VERIO) w/Device KIT Use to test blood sugar daily as directed 1 kit 0   Calcium-Vitamin D-Vitamin K (SM CALCIUM SOFT CHEWS PO) Take 1 application by mouth 2 (two) times daily. Chew calcium tabs twice a day.  (bariatric surgery)     docusate sodium (COLACE) 100 MG capsule Take 100 mg by mouth 2 (two) times daily.     gabapentin (NEURONTIN) 300 MG capsule Take 300 mg by mouth 3 (three) times daily.     glucose blood test strip Use to test blood sugar daily as directed 100 each 12   lamoTRIgine (LAMICTAL) 200 MG tablet Take 400 mg by mouth at bedtime.       levothyroxine (SYNTHROID) 150 MCG tablet Take 1 tablet (150 mcg total) by mouth daily. 90 tablet 3   lithium 300 MG tablet Take 300 mg by mouth 2 (two) times daily.     loratadine (CLARITIN) 10 MG tablet Take 1 tablet (10 mg total) by mouth daily. 30 tablet 11   magic mouthwash w/lidocaine SOLN Gargle and spit 30m every 6 hours as needed for sore throat. 647mlidocaine, 6044mystatin, 20m85mdrocortisone tab, qs benadryl total 480mL65m0 mL 0   metFORMIN (GLUCOPHAGE) 1000 MG tablet Take 1 tablet (1,000 mg total) by mouth 2 (two) times daily with a meal. 180 tablet 3   Multiple Vitamin (MULTIVITAMIN WITH MINERALS) TABS tablet Take 1 tablet by mouth 2 (two) times daily. For Vitamin supplementation (Patient taking differently: Take 1 tablet by mouth 2 (two) times daily. For Vitamin supplementation FROM GASTRIC BYPASS)     OneTouch Delica Lancets 30G M54U Use to test blood sugar daily as directed 100 each 3   pantoprazole (PROTONIX) 40 MG tablet Take 1 tablet (40 mg total) by mouth 2 (two) times daily. Failed Nexium, Omeprazole, Prevacid OTC, Pepcid, Zantac 180 tablet 3   promethazine (PHENERGAN) 12.5 MG tablet TAKE 1 TO 2 TABLETS EVERY 8 HOURS AS NEEDED FOR NAUSEA AND VOMITING 60 tablet 1   psyllium (METAMUCIL) 58.6 % powder Take 1 packet by mouth 2 (two) times daily.     rosuvastatin (CRESTOR) 5 MG tablet Take 1 tablet (5 mg total) by mouth daily. 90 tablet 3   tiZANidine (ZANAFLEX) 4 MG tablet Take 1 tablet (4 mg total) by mouth every 8 (eight) hours as needed for muscle spasms. 30 tablet 1   vitamin B-12 (CYANOCOBALAMIN) 250 MCG tablet Take 1 tablet (250 mcg total) by mouth daily. For low B-12 supplementation 1 tablet    FLUoxetine (PROZAC) 20 MG tablet Take 20 mg by mouth daily.     No facility-administered medications prior to visit.    Allergies  Allergen Reactions   Abilify [Aripiprazole] Anaphylaxis and Swelling    "slurred speech" FACIAL DROOPING, TONGUE SWELLING, TARDIVE DYSKENESIA     Caplyta [Lumateperone] Anaphylaxis   Seroquel [Quetiapine Fumarate]     tardive dyskinesia   Alcohol Itching    Drinking alcohol   Ambien [Zolpidem] Other (See Comments)    NIGHT TERRORS   Anacin-3 [Acetaminophen] Itching    REGULAR TYLENOL IS  OKAY.  NEEDS BENADRYL TO TAKE THIS   Cariprazine Other (See Comments)    Tardive dyskenesia   Cariprazine Hcl Itching   Latuda [Lurasidone Hcl] Other (See Comments)    RUNS SUGAR TOO HIGH   Lurasidone Other (See Comments)    TARDIVE DYSKINESIA   Prozac [Fluoxetine] Itching   Victoza [Liraglutide] Other (See Comments)    Severe heartburn    Bupropion Rash   Hydrocodone-Acetaminophen Itching   Risperidone Rash   Tegretol [Carbamazepine] Rash    Review of Systems As per HPI.     Objective:    Physical Exam Vitals and nursing note reviewed.  Constitutional:      General: He is not in acute distress.    Appearance: He is ill-appearing. He is not toxic-appearing or diaphoretic.  HENT:     Head: Normocephalic and atraumatic.     Right Ear: Tympanic membrane, ear canal and external ear normal.     Left Ear: Tympanic membrane, ear canal and external ear normal.     Nose: Congestion present.     Mouth/Throat:     Mouth: Mucous membranes are moist.     Pharynx: Oropharynx is clear.  Cardiovascular:     Rate and Rhythm: Normal rate and regular rhythm.     Heart sounds: Normal heart sounds. No murmur heard. Pulmonary:     Effort: Pulmonary effort is normal. No respiratory distress.     Breath sounds: Normal breath sounds. No stridor. No wheezing or rales.  Chest:     Chest wall: No tenderness.  Abdominal:     General: Bowel sounds are normal. There is no distension.     Palpations: Abdomen is soft.     Tenderness: There is no abdominal tenderness. There is no guarding.  Musculoskeletal:     Right lower leg: No edema.     Left lower leg: No edema.  Skin:    General: Skin is warm and dry.  Neurological:     General: No focal  deficit present.     Mental Status: He is alert and oriented to person, place, and time.  Psychiatric:        Mood and Affect: Mood normal.        Behavior: Behavior normal.    BP 130/77   Pulse 60   Temp 97.8 F (36.6 C) (Temporal)   SpO2 97%  Wt Readings from Last 3 Encounters:  08/01/21 255 lb 6.4 oz (115.8 kg)  04/30/21 243 lb (110.2 kg)  03/20/21 249 lb (112.9 kg)    Health Maintenance Due  Topic Date Due   FOOT EXAM  Never done   OPHTHALMOLOGY EXAM  Never done   COVID-19 Vaccine (4 - Booster for Moderna series) 02/09/2021   INFLUENZA VACCINE  07/16/2021    There are no preventive care reminders to display for this patient.   Lab Results  Component Value Date   TSH 9.340 (H) 08/23/2021   Lab Results  Component Value Date   WBC 6.7 08/01/2021   HGB 14.0 08/01/2021   HCT 42.2 08/01/2021   MCV 87 08/01/2021   PLT 253 08/01/2021   Lab Results  Component Value Date   NA 141 08/01/2021   K 4.7 08/01/2021   CO2 24 08/01/2021   GLUCOSE 157 (H) 08/01/2021   BUN 14 08/01/2021   CREATININE 1.04 08/01/2021   BILITOT 0.2 08/01/2021   ALKPHOS 83 08/01/2021   AST 24 08/01/2021   ALT 31 08/01/2021   PROT 6.5 08/01/2021  ALBUMIN 4.8 08/01/2021   CALCIUM 9.8 08/01/2021   ANIONGAP 10 10/27/2020   EGFR 87 08/01/2021   Lab Results  Component Value Date   CHOL 141 04/30/2021   Lab Results  Component Value Date   HDL 47 04/30/2021   Lab Results  Component Value Date   LDLCALC 79 04/30/2021   Lab Results  Component Value Date   TRIG 75 04/30/2021   Lab Results  Component Value Date   CHOLHDL 3.0 04/30/2021   Lab Results  Component Value Date   HGBA1C 8.3 (H) 08/01/2021       Assessment & Plan:   Shriyans was seen today for fatigue.  Diagnoses and all orders for this visit:  Viral upper respiratory tract infection Discussed fatigue likely related to viral infection. However I did need in augmentin as below for his to start if symptoms worsen or  to no improve other the week. If no improvement with Augmentin, will need to follow up for further evaluation. Discussed symptomatic care and return precautions.  -     amoxicillin-clavulanate (AUGMENTIN) 875-125 MG tablet; Take 1 tablet twice a day for 7 days if symptoms worsen or do not improve in 3 days.  Return to office for new or worsening symptoms, or if symptoms persist.   The patient indicates understanding of these issues and agrees with the plan.  Gwenlyn Perking, FNP

## 2021-09-10 ENCOUNTER — Encounter: Payer: Self-pay | Admitting: Family Medicine

## 2021-09-13 ENCOUNTER — Telehealth: Payer: Self-pay | Admitting: Family Medicine

## 2021-09-13 NOTE — Telephone Encounter (Signed)
Reviewed labs with pt per provider recommendations on 08/23/21 and pt voiced understanding.

## 2021-10-05 ENCOUNTER — Other Ambulatory Visit: Payer: Self-pay | Admitting: Family Medicine

## 2021-10-08 ENCOUNTER — Ambulatory Visit (INDEPENDENT_AMBULATORY_CARE_PROVIDER_SITE_OTHER): Payer: 59

## 2021-10-08 ENCOUNTER — Other Ambulatory Visit: Payer: 59

## 2021-10-08 ENCOUNTER — Other Ambulatory Visit: Payer: Self-pay

## 2021-10-08 DIAGNOSIS — E039 Hypothyroidism, unspecified: Secondary | ICD-10-CM

## 2021-10-08 DIAGNOSIS — Z23 Encounter for immunization: Secondary | ICD-10-CM | POA: Diagnosis not present

## 2021-10-09 LAB — T4, FREE: Free T4: 1.53 ng/dL (ref 0.82–1.77)

## 2021-10-09 LAB — TSH: TSH: 1.1 u[IU]/mL (ref 0.450–4.500)

## 2021-10-16 ENCOUNTER — Ambulatory Visit: Payer: 59

## 2021-11-01 ENCOUNTER — Other Ambulatory Visit: Payer: Self-pay | Admitting: Family Medicine

## 2021-11-06 ENCOUNTER — Other Ambulatory Visit: Payer: Self-pay | Admitting: *Deleted

## 2021-11-06 MED ORDER — GLUCOSE BLOOD VI STRP: ORAL_STRIP | 3 refills | Status: DC

## 2021-11-22 ENCOUNTER — Encounter: Payer: Self-pay | Admitting: Family Medicine

## 2021-11-22 ENCOUNTER — Ambulatory Visit (INDEPENDENT_AMBULATORY_CARE_PROVIDER_SITE_OTHER): Payer: 59 | Admitting: Family Medicine

## 2021-11-22 VITALS — BP 124/88 | HR 92 | Temp 97.3°F | Ht 70.0 in | Wt 262.6 lb

## 2021-11-22 DIAGNOSIS — Z9884 Bariatric surgery status: Secondary | ICD-10-CM | POA: Diagnosis not present

## 2021-11-22 DIAGNOSIS — E1169 Type 2 diabetes mellitus with other specified complication: Secondary | ICD-10-CM | POA: Diagnosis not present

## 2021-11-22 DIAGNOSIS — F39 Unspecified mood [affective] disorder: Secondary | ICD-10-CM | POA: Diagnosis not present

## 2021-11-22 DIAGNOSIS — E039 Hypothyroidism, unspecified: Secondary | ICD-10-CM

## 2021-11-22 DIAGNOSIS — E785 Hyperlipidemia, unspecified: Secondary | ICD-10-CM

## 2021-11-22 LAB — BAYER DCA HB A1C WAIVED: HB A1C (BAYER DCA - WAIVED): 6.8 % — ABNORMAL HIGH (ref 4.8–5.6)

## 2021-11-22 NOTE — Progress Notes (Signed)
Subjective: CC:DM PCP: Janora Norlander, DO GEZ:MOQHUTM Marvin Espinoza is a 50 y.o. male presenting to clinic today for:  1. Type 2 Diabetes with hyperlipidemia:  Patient is accompanied today's visit by his wife.  He reports compliance with his metformin, Crestor.    Last eye exam: needs Last foot exam: needs Last A1c:  Lab Results  Component Value Date   HGBA1C 8.3 (H) 08/01/2021   Nephropathy screen indicated?: UTD Last flu, zoster and/or pneumovax:  Immunization History  Administered Date(s) Administered   Influenza,inj,Quad PF,6+ Mos 11/19/2016, 10/20/2017, 09/11/2018, 08/20/2019, 09/27/2020   Influenza,inj,quad, With Preservative 12/16/2017   Moderna Sars-Covid-2 Vaccination 03/04/2020, 04/05/2020, 10/09/2020   Tdap 05/10/2020   Zoster Recombinat (Shingrix) 08/01/2021, 10/08/2021    ROS: No chest pain, shortness of breath reported  2.  History of bariatric surgery/ Mood Patient was compliant with his vitamin B12 and multivitamin.  Unfortunately has had some weight gain since his bariatric surgery and is now treated for recurrent diabetes as above.  He has been struggling a lot with his mental health as of late.  Had some intolerance to olanzapine due to elevation of blood sugars.  He feels strongly about not going on any injection therapy again for his diabetes as he had problems with scarring and needles breaking off in his belly in the past.  He would be amenable to other oral medications however and has an appointment today at 8 with his psychiatrist to discuss medications.  He has history of electroconvulsive therapy and has been on multiple medications in the past but had various intolerances or side effects that required discontinuation of these medications.  ROS: Per HPI  Allergies  Allergen Reactions   Abilify [Aripiprazole] Anaphylaxis and Swelling    "slurred speech" FACIAL DROOPING, TONGUE SWELLING, TARDIVE DYSKENESIA    Caplyta [Lumateperone] Anaphylaxis    Seroquel [Quetiapine Fumarate]     tardive dyskinesia   Alcohol Itching    Drinking alcohol   Ambien [Zolpidem] Other (See Comments)    NIGHT TERRORS   Anacin-3 [Acetaminophen] Itching    REGULAR TYLENOL IS OKAY.  NEEDS BENADRYL TO TAKE THIS   Cariprazine Other (See Comments)    Tardive dyskenesia   Cariprazine Hcl Itching   Latuda [Lurasidone Hcl] Other (See Comments)    RUNS SUGAR TOO HIGH   Lurasidone Other (See Comments)    TARDIVE DYSKINESIA   Prozac [Fluoxetine] Itching   Victoza [Liraglutide] Other (See Comments)    Severe heartburn    Bupropion Rash   Hydrocodone-Acetaminophen Itching   Risperidone Rash   Tegretol [Carbamazepine] Rash   Past Medical History:  Diagnosis Date   Anxiety    Bipolar disorder ([place])    CTS (carpal tunnel syndrome)    Depression    Diabetes mellitus without complication ([place])    GERD (gastroesophageal reflux disease)    H/O bariatric surgery 2016   Head injury 05/06/2017   Hypothyroidism    Hypothyroidism    Sleep apnea    no longer with OSA d/t over 100 pd weight loss    Current Outpatient Medications:    ALPRAZolam (XANAX) 1 MG tablet, 1 mg. Six times per day prn, Disp: , Rfl:    azelastine (ASTELIN) 0.1 % nasal spray, Place 1 spray into both nostrils 2 (two) times daily., Disp: 30 mL, Rfl: 12   Blood Glucose Monitoring Suppl (ONETOUCH VERIO) w/Device KIT, Use to test blood sugar daily as directed, Disp: 1 kit, Rfl: 0   Calcium-Vitamin D-Vitamin K (SM CALCIUM SOFT  CHEWS PO), Take 1 application by mouth 2 (two) times daily. Chew calcium tabs twice a day.  (bariatric surgery), Disp: , Rfl:    docusate sodium (COLACE) 100 MG capsule, Take 100 mg by mouth 2 (two) times daily., Disp: , Rfl:    gabapentin (NEURONTIN) 300 MG capsule, Take 300 mg by mouth 3 (three) times daily., Disp: , Rfl:    glucose blood test strip, test blood sugar daily as directed Dx E11.9, Disp: 100 each, Rfl: 3   lamoTRIgine (LAMICTAL) 200 MG tablet, Take 400 mg by  mouth at bedtime. , Disp: , Rfl:    levothyroxine (SYNTHROID) 150 MCG tablet, Take 1 tablet (150 mcg total) by mouth daily., Disp: 90 tablet, Rfl: 3   lithium 300 MG tablet, Take 300 mg by mouth 2 (two) times daily., Disp: , Rfl:    loratadine (CLARITIN) 10 MG tablet, Take 1 tablet (10 mg total) by mouth daily., Disp: 30 tablet, Rfl: 11   metFORMIN (GLUCOPHAGE) 1000 MG tablet, Take 1 tablet (1,000 mg total) by mouth 2 (two) times daily with a meal., Disp: 180 tablet, Rfl: 3   Multiple Vitamin (MULTIVITAMIN WITH MINERALS) TABS tablet, Take 1 tablet by mouth 2 (two) times daily. For Vitamin supplementation (Patient taking differently: Take 1 tablet by mouth 2 (two) times daily. For Vitamin supplementation FROM GASTRIC BYPASS), Disp: , Rfl:    OneTouch Delica Lancets 42A MISC, Use to test blood sugar daily as directed, Disp: 100 each, Rfl: 3   pantoprazole (PROTONIX) 40 MG tablet, TAKE ONE TABLET BY MOUTH TWICE DAILY, Disp: 180 tablet, Rfl: 0   promethazine (PHENERGAN) 12.5 MG tablet, TAKE 1 TO 2 TABLETS EVERY 8 HOURS AS NEEDED FOR NAUSEA AND VOMITING, Disp: 60 tablet, Rfl: 1   psyllium (METAMUCIL) 58.6 % powder, Take 1 packet by mouth 2 (two) times daily., Disp: , Rfl:    rosuvastatin (CRESTOR) 5 MG tablet, Take 1 tablet (5 mg total) by mouth daily., Disp: 90 tablet, Rfl: 3   tiZANidine (ZANAFLEX) 4 MG tablet, Take 1 tablet (4 mg total) by mouth every 8 (eight) hours as needed for muscle spasms., Disp: 30 tablet, Rfl: 1   vitamin B-12 (CYANOCOBALAMIN) 250 MCG tablet, Take 1 tablet (250 mcg total) by mouth daily. For low B-12 supplementation, Disp: 1 tablet, Rfl:  Social History   Socioeconomic History   Marital status: Married    Spouse name: Mickel Baas   Number of children: 1   Years of education: Not on file   Highest education level: Not on file  Occupational History   Not on file  Tobacco Use   Smoking status: Never   Smokeless tobacco: Never  Vaping Use   Vaping Use: Never used  Substance  and Sexual Activity   Alcohol use: No    Comment: severe allergy to alcohol!!   Drug use: No   Sexual activity: Yes  Other Topics Concern   Not on file  Social History Narrative   Patient lives in mothers' home. He stays alone until nighttime when wife comes up.    He stays there to keep an eye on the house.    His son comes to visit also.          Social Determinants of Health   Financial Resource Strain: Not on file  Food Insecurity: Not on file  Transportation Needs: Not on file  Physical Activity: Not on file  Stress: Not on file  Social Connections: Not on file  Intimate Partner Violence: Not  on file   Family History  Problem Relation Age of Onset   Diabetes Mother    Heart disease Mother    Depression Mother    Esophageal cancer Mother    COPD Father    Heart disease Father    Kidney disease Father    Mental illness Father    Depression Father    Mental illness Sister    Depression Sister    Esophageal cancer Maternal Uncle    Colon cancer Neg Hx    Rectal cancer Neg Hx    Stomach cancer Neg Hx     Objective: Office vital signs reviewed. BP 124/88   Pulse 92   Temp (!) 97.3 F (36.3 C) (Temporal)   Ht _0  (1.778 m)   Wt 262 lb 9.6 oz (119.1 kg)   SpO2 97%   BMI 37.68 kg/m   Physical Examination:  General: Awake, alert, obese, No acute distress HEENT: Normal; sclera white.  No exophthalmos or goiter Cardio: regular rate and rhythm, S1S2 heard, no murmurs appreciated Pulm: clear to auscultation bilaterally, no wheezes, rhonchi or rales; normal work of breathing on room air Extremities: warm, well perfused, No edema, cyanosis or clubbing; +2 pulses bilaterally Neuro: Has some unsteady balance noted upon getting up today.  See diabetic foot exam  Diabetic Foot Exam - Simple   Simple Foot Form Diabetic Foot exam was performed with the following findings: Yes 11/22/2021  9:59 AM  Visual Inspection No deformities, no ulcerations, no other skin  breakdown bilaterally: Yes Sensation Testing Intact to touch and monofilament testing bilaterally: Yes Pulse Check Posterior Tibialis and Dorsalis pulse intact bilaterally: Yes Comments      Assessment/ Plan: 50 y.o. male   Type 2 diabetes mellitus with other specified complication, without long-term current use of insulin (HCC) - Plan: Bayer DCA Hb A1c Waived  Hyperlipidemia associated with type 2 diabetes mellitus ([facility]CC)  Acquired hypothyroidism - Plan: TSH, T4, Free  Mood disorder ([facility]CC)  History of bariatric surgery - Plan: VITAMIN D 25 Hydroxy (Vit-D Deficiency, Fractures), Vitamin B12  Blood sugar is now controlled with A1c down to 6.8.  We had a frank discussion today.  I would like him to pursue what ever medications are helpful to his mood disorder as he clearly has quite a impact on his quality of life from uncontrolled anxiety and depressive symptoms.  He reports being manic during today's visit.  I am glad to try and correct his blood sugars utilizing alternative medications which I do not see has any intolerances to such as Lovie Macadamia, Januvia, Rybelsus.  Rybelsus may be of fairly good help from both the sugar and weight standpoint for this patient  He will continue his statin.  Plan for fasting lipid panel at a future date  Thyroid labs were obtained, particularly given mood instability and weight gain  Vitamin D level and vitamin B12 level also obtained.  He had some balance abnormalities noted today and I do question the B12 being an issue given his bariatric surgery history.  However he did note some left-sided hip pain for which she will be undergoing MRI and follow-up with orthopedist later this month  No orders of the defined types were placed in this encounter.  No orders of the defined types were placed in this encounter.    Janora Norlander, DO [place] [phone]

## 2021-11-22 NOTE — Patient Instructions (Signed)

## 2021-11-23 ENCOUNTER — Other Ambulatory Visit: Payer: Self-pay | Admitting: Family Medicine

## 2021-11-23 DIAGNOSIS — E039 Hypothyroidism, unspecified: Secondary | ICD-10-CM

## 2021-11-23 LAB — VITAMIN B12: Vitamin B-12: >2000 pg/mL — ABNORMAL HIGH (ref 232–1245)

## 2021-11-23 LAB — TSH: TSH: 7.81 u[IU]/mL — ABNORMAL HIGH (ref 0.450–4.500)

## 2021-11-23 LAB — VITAMIN D 25 HYDROXY (VIT D DEFICIENCY, FRACTURES): Vit D, 25-Hydroxy: 35.6 ng/mL (ref 30.0–100.0)

## 2021-11-23 LAB — T4, FREE: Free T4: 1.12 ng/dL (ref 0.82–1.77)

## 2021-11-23 MED ORDER — LEVOTHYROXINE SODIUM 175 MCG PO TABS: 175.0000 ug | ORAL_TABLET | Freq: Every day | ORAL | 3 refills | Status: DC

## 2021-11-23 MED ORDER — LEVOTHYROXINE SODIUM 150 MCG PO TABS: 150.0000 ug | ORAL_TABLET | Freq: Every day | ORAL | 3 refills | Status: DC

## 2021-12-19 DIAGNOSIS — M25552 Pain in left hip: Secondary | ICD-10-CM | POA: Diagnosis not present

## 2021-12-24 ENCOUNTER — Ambulatory Visit (INDEPENDENT_AMBULATORY_CARE_PROVIDER_SITE_OTHER): Payer: 59 | Admitting: Family Medicine

## 2021-12-24 ENCOUNTER — Encounter: Payer: Self-pay | Admitting: Family Medicine

## 2021-12-24 VITALS — BP 138/87 | HR 74 | Temp 97.8°F | Ht 70.0 in | Wt 272.0 lb

## 2021-12-24 DIAGNOSIS — E039 Hypothyroidism, unspecified: Secondary | ICD-10-CM

## 2021-12-24 DIAGNOSIS — M5136 Other intervertebral disc degeneration, lumbar region: Secondary | ICD-10-CM | POA: Diagnosis not present

## 2021-12-24 DIAGNOSIS — R69 Illness, unspecified: Secondary | ICD-10-CM | POA: Diagnosis not present

## 2021-12-24 DIAGNOSIS — E1169 Type 2 diabetes mellitus with other specified complication: Secondary | ICD-10-CM | POA: Diagnosis not present

## 2021-12-24 DIAGNOSIS — Z23 Encounter for immunization: Secondary | ICD-10-CM

## 2021-12-24 DIAGNOSIS — F39 Unspecified mood [affective] disorder: Secondary | ICD-10-CM

## 2021-12-24 MED ORDER — GABAPENTIN 300 MG PO CAPS: 300.0000 mg | ORAL_CAPSULE | Freq: Three times a day (TID) | ORAL | 3 refills | Status: DC

## 2021-12-24 MED ORDER — PROMETHAZINE HCL 12.5 MG PO TABS: ORAL_TABLET | ORAL | 1 refills | Status: DC

## 2021-12-24 MED ORDER — TIZANIDINE HCL 4 MG PO TABS: 4.0000 mg | ORAL_TABLET | Freq: Three times a day (TID) | ORAL | 3 refills | Status: DC | PRN

## 2021-12-24 NOTE — Progress Notes (Signed)
° °Subjective: °CC: Hypothyroidism °PCP: ,  M, DO °HPI:Marvin Espinoza is a 51 y.o. male who is accompanied today's visit by his wife.  He is presenting to clinic today for: ° °1.  Hypothyroidism °Patient noted to have an elevation in TSH last visit.  We changed his Synthroid to 175 mcg alternating 150 mcg daily.  Initially, he did have some loose stools with it has since normalized and his bowel movements have been normal.  He unfortunately continues to gain weight but admits that he still has some uncontrolled depression.  He was recently started on a new medication called Lybalvi and is continued on Lamictal.  This apparently supposed to be weight neutral medication.  He is only been on it for about 2 weeks now. ° °2.  Chronic back pain °Patient is seen back specialist in the past but really has not made much headway with various interventions.  He takes gabapentin, Zanaflex as needed pain and is asking for me to renew these today.  No reports of excessive daytime sedation with that medicine. ° ° °ROS: Per HPI ° °Allergies  °Allergen Reactions  ° Abilify [Aripiprazole] Anaphylaxis and Swelling  °  "slurred speech" °FACIAL DROOPING, TONGUE SWELLING, TARDIVE DYSKENESIA °  ° Caplyta [Lumateperone] Anaphylaxis  ° Seroquel [Quetiapine Fumarate]   °  tardive dyskinesia  ° Alcohol Itching  °  Drinking alcohol  ° Ambien [Zolpidem] Other (See Comments)  °  NIGHT TERRORS  ° Anacin-3 [Acetaminophen] Itching  °  REGULAR TYLENOL IS OKAY.  °NEEDS BENADRYL TO TAKE THIS  ° Cariprazine Other (See Comments)  °  Tardive dyskenesia  ° Cariprazine Hcl Itching  ° Latuda [Lurasidone Hcl] Other (See Comments)  °  RUNS SUGAR TOO HIGH  ° Lurasidone Other (See Comments)  °  TARDIVE DYSKINESIA  ° Prozac [Fluoxetine] Itching  ° Victoza [Liraglutide] Other (See Comments)  °  Severe heartburn °  ° Bupropion Rash  ° Hydrocodone-Acetaminophen Itching  ° Risperidone Rash  ° Tegretol [Carbamazepine] Rash  ° °Past Medical History:   °Diagnosis Date  ° Anxiety   ° Bipolar disorder (HCC)   ° CTS (carpal tunnel syndrome)   ° Depression   ° Diabetes mellitus without complication (HCC)   ° GERD (gastroesophageal reflux disease)   ° H/O bariatric surgery 2016  ° Head injury 05/06/2017  ° Hypothyroidism   ° Hypothyroidism   ° Sleep apnea   ° no longer with OSA d/t over 100 pd weight loss  ° ° °Current Outpatient Medications:  °  ALPRAZolam (XANAX) 1 MG tablet, 1 mg. Six times per day prn, Disp: , Rfl:  °  azelastine (ASTELIN) 0.1 % nasal spray, Place 1 spray into both nostrils 2 (two) times daily., Disp: 30 mL, Rfl: 12 °  Blood Glucose Monitoring Suppl (ONETOUCH VERIO) w/Device KIT, Use to test blood sugar daily as directed, Disp: 1 kit, Rfl: 0 °  Calcium-Vitamin D-Vitamin K (SM CALCIUM SOFT CHEWS PO), Take 1 application by mouth 2 (two) times daily. Chew calcium tabs twice a day.  (bariatric surgery), Disp: , Rfl:  °  docusate sodium (COLACE) 100 MG capsule, Take 100 mg by mouth 2 (two) times daily., Disp: , Rfl:  °  gabapentin (NEURONTIN) 300 MG capsule, Take 300 mg by mouth 3 (three) times daily., Disp: , Rfl:  °  glucose blood test strip, test blood sugar daily as directed Dx E11.9, Disp: 100 each, Rfl: 3 °  lamoTRIgine (LAMICTAL) 200 MG tablet, Take 400 mg by mouth at   at bedtime. , Disp: , Rfl:    levothyroxine (SYNTHROID) 150 MCG tablet, Take 1 tablet (150 mcg total) by mouth daily. Alternate with 183mg, Disp: 90 tablet, Rfl: 3   levothyroxine (SYNTHROID) 175 MCG tablet, Take 1 tablet (175 mcg total) by mouth daily. Alternate with 150 mcg, Disp: 90 tablet, Rfl: 3   loratadine (CLARITIN) 10 MG tablet, Take 1 tablet (10 mg total) by mouth daily., Disp: 30 tablet, Rfl: 11   metFORMIN (GLUCOPHAGE) 1000 MG tablet, Take 1 tablet (1,000 mg total) by mouth 2 (two) times daily with a meal., Disp: 180 tablet, Rfl: 3   Multiple Vitamin (MULTIVITAMIN WITH MINERALS) TABS tablet, Take 1 tablet by mouth 2 (two) times daily. For Vitamin supplementation  (Patient taking differently: Take 1 tablet by mouth 2 (two) times daily. For Vitamin supplementation FROM GASTRIC BYPASS), Disp: , Rfl:    OneTouch Delica Lancets 378GMISC, Use to test blood sugar daily as directed, Disp: 100 each, Rfl: 3   pantoprazole (PROTONIX) 40 MG tablet, TAKE ONE TABLET BY MOUTH TWICE DAILY, Disp: 180 tablet, Rfl: 0   promethazine (PHENERGAN) 12.5 MG tablet, TAKE 1 TO 2 TABLETS EVERY 8 HOURS AS NEEDED FOR NAUSEA AND VOMITING, Disp: 60 tablet, Rfl: 1   psyllium (METAMUCIL) 58.6 % powder, Take 1 packet by mouth 2 (two) times daily., Disp: , Rfl:    rosuvastatin (CRESTOR) 5 MG tablet, Take 1 tablet (5 mg total) by mouth daily., Disp: 90 tablet, Rfl: 3   tiZANidine (ZANAFLEX) 4 MG tablet, Take 1 tablet (4 mg total) by mouth every 8 (eight) hours as needed for muscle spasms., Disp: 30 tablet, Rfl: 1   vitamin B-12 (CYANOCOBALAMIN) 250 MCG tablet, Take 1 tablet (250 mcg total) by mouth daily. For low B-12 supplementation, Disp: 1 tablet, Rfl:  Social History   Socioeconomic History   Marital status: Married    Spouse name: LMickel Baas  Number of children: 1   Years of education: Not on file   Highest education level: Not on file  Occupational History   Not on file  Tobacco Use   Smoking status: Never   Smokeless tobacco: Never  Vaping Use   Vaping Use: Never used  Substance and Sexual Activity   Alcohol use: No    Comment: severe allergy to alcohol!!   Drug use: No   Sexual activity: Yes  Other Topics Concern   Not on file  Social History Narrative   Patient lives in mothers' home. He stays alone until nighttime when wife comes up.    He stays there to keep an eye on the house.    His son comes to visit also.          Social Determinants of Health   Financial Resource Strain: Not on file  Food Insecurity: Not on file  Transportation Needs: Not on file  Physical Activity: Not on file  Stress: Not on file  Social Connections: Not on file  Intimate Partner  Violence: Not on file   Family History  Problem Relation Age of Onset   Diabetes Mother    Heart disease Mother    Depression Mother    Esophageal cancer Mother    COPD Father    Heart disease Father    Kidney disease Father    Mental illness Father    Depression Father    Mental illness Sister    Depression Sister    Esophageal cancer Maternal Uncle    Colon cancer Neg Hx  Rectal cancer Neg Hx    Stomach cancer Neg Hx     Objective: Office vital signs reviewed. BP 138/87    Pulse 74    Temp 97.8 F (36.6 C)    Ht 5' 10" (1.778 m)    Wt 272 lb (123.4 kg)    SpO2 95%    BMI 39.03 kg/m   Physical Examination:  General: Awake, alert, obese, No acute distress HEENT: No thyromegaly, goiter, exophthalmos Cardio: regular rate and rhythm, S1S2 heard, no murmurs appreciated Pulm: clear to auscultation bilaterally, no wheezes, rhonchi or rales; normal work of breathing on room air MSK: Ambulating independently Psych: Mood is depressed.  Patient is pleasant and interactive  Assessment/ Plan: 51 y.o. male   Acquired hypothyroidism - Plan: TSH, T4, Free  Type 2 diabetes mellitus with other specified complication, without long-term current use of insulin (HCC) - Plan: CANCELED: Bayer DCA Hb A1c Waived  Need for pneumococcal vaccination - Plan: Pneumococcal conjugate vaccine 20-valent (Prevnar 20)  DDD (degenerative disc disease), lumbar - Plan: gabapentin (NEURONTIN) 300 MG capsule, tiZANidine (ZANAFLEX) 4 MG tablet  Mood disorder (HCC) - Plan: OLANZapine-Samidorphan (LYBALVI PO)  Continues to have weight issues as well as uncontrolled depression.  Check TSH, free T4  Currently being treated with metformin for diabetes but we discussed consideration for switch over to [facility] or similar as he is really wanting to start losing weight.  I think the discontinuation of metformin would be reasonable if we were to replace it with Mounjaro.  Though I do have concerns about his very  long history of multiple medication intolerances including severe heartburn with Victoza.  We will revisit this in another month or 2.  I have given him information he will do a little more reading  Pneumococcal vaccination was administered  For his DDD, Zanaflex and gabapentin were renewed  He will continue following up with psychiatry for his mood disorder.  I have updated his record to reflect new medication.  I do wonder if his weight gain however his due to uncontrolled depression and use of food as an emotional crutch  Orders Placed This Encounter  Procedures   Pneumococcal conjugate vaccine 20-valent (Prevnar 20)   Bayer DCA Hb A1c Waived   No orders of the defined types were placed in this encounter.    Janora Norlander, DO [place] [phone]

## 2021-12-24 NOTE — Patient Instructions (Signed)
Mounjaro might be a good option for weight and blood sugar.  Do some reading.  If you are interested in it, go to their website and complete to coupon.  We can probably sample a box out to you here if you want to try it before you buy it

## 2021-12-25 LAB — TSH: TSH: 2.76 u[IU]/mL (ref 0.450–4.500)

## 2021-12-25 LAB — T4, FREE: Free T4: 1.16 ng/dL (ref 0.82–1.77)

## 2022-01-04 DIAGNOSIS — M1612 Unilateral primary osteoarthritis, left hip: Secondary | ICD-10-CM | POA: Diagnosis not present

## 2022-01-08 ENCOUNTER — Encounter: Payer: Self-pay | Admitting: Family Medicine

## 2022-01-08 ENCOUNTER — Telehealth: Payer: Self-pay | Admitting: *Deleted

## 2022-01-08 ENCOUNTER — Other Ambulatory Visit: Payer: Self-pay | Admitting: Family Medicine

## 2022-01-08 DIAGNOSIS — E1169 Type 2 diabetes mellitus with other specified complication: Secondary | ICD-10-CM

## 2022-01-08 MED ORDER — TIRZEPATIDE 2.5 MG/0.5ML ~~LOC~~ SOAJ: 2.5000 mg | SUBCUTANEOUS | 0 refills | Status: DC

## 2022-01-08 NOTE — Telephone Encounter (Signed)
Mounjaro 2.5 mg/0.5 ml PA started  Key: [id number] Sent to plan

## 2022-01-09 NOTE — Telephone Encounter (Signed)
Denied today Your PA request has been denied. Additional information will be provided in the denial communication  Coverage for this medication is denied for the following reason(s). We reviewed the information we received about your condition and circumstances. We used the plan approved policy when making this decision. The policy states that this medication may be approved when: - The member has a clinical condition or needs a specific dosage form for which there is no alternative on the formulary OR - The listed formulary alternatives are not recommended based on published guidelines or clinical literature OR - The formulary alternatives will likely be ineffective or less effective for the member OR - The formulary alternatives will likely cause an adverse effect OR - The member is unable to take the required number of formulary alternatives for the given diagnosis due to a trial and inadequate treatment response or contraindication OR - The member has tried and failed the required number of formulary alternatives.

## 2022-01-09 NOTE — Telephone Encounter (Signed)
Please inform patient.  Not sure if he might be able to call his ins to get more information.  I will also cc julie, as pt has FAILED Victoza so not a good candidate for Rybelsus or Ozempic and Trulicity is on backorder.

## 2022-01-23 ENCOUNTER — Telehealth: Payer: Self-pay

## 2022-01-23 DIAGNOSIS — E119 Type 2 diabetes mellitus without complications: Secondary | ICD-10-CM

## 2022-01-23 MED ORDER — TRULICITY 0.75 MG/0.5ML ~~LOC~~ SOAJ: 0.7500 mg | SUBCUTANEOUS | 3 refills | Status: DC

## 2022-01-23 NOTE — Telephone Encounter (Signed)
Patient would have to try/fail at least trulicity & victoza  before insurance will pick up [facility]. I would switch to trulicity per his formulary (patient will have to call around to see where it is available; several local stores have been able to get; Unfortunately, we may have to address each GLP1 monthly based on what we are able to find in stock.  Will route to PCP & clinical pools

## 2022-01-23 NOTE — Addendum Note (Signed)
Addended by: Lottie Dawson D on: 01/23/2022 11:49 AM   Modules accepted: Orders

## 2022-01-23 NOTE — Telephone Encounter (Signed)
Pt has already tried victoza and had severe allergic reaction from it, pt wife states she is on trulicity and with insurance she is paying out of pocket 200$. States they will go on trulicity if they have to but it will be a struggle to afford meds.

## 2022-01-23 NOTE — Telephone Encounter (Signed)
Sent mychart

## 2022-01-23 NOTE — Telephone Encounter (Signed)
Has she tried the $25 copay card  She has to go online and apply for one Should bring down to $50 [email or web address]

## 2022-01-23 NOTE — Telephone Encounter (Signed)
Trulicity called in for patient Let them know They need to apply for $25 copay card online We can increase dose in 4 weeks to trulicity 1.5mg 

## 2022-01-23 NOTE — Telephone Encounter (Signed)
Alrwady put a note.

## 2022-01-28 NOTE — Telephone Encounter (Signed)
Glad to change to Trulicity if patient can find it at a pharmacy.  Just let me know where he finds it and I will send in

## 2022-01-28 NOTE — Telephone Encounter (Signed)
I spoke to the pt and he says he would have a $396-$886 copay for Trulicity and he can't afford that.

## 2022-01-28 NOTE — Telephone Encounter (Signed)
I think julie was trying to arrange a coupon for him?  Do we have any samples Almyra Free that he can try?

## 2022-02-04 ENCOUNTER — Telehealth: Payer: Self-pay | Admitting: Family Medicine

## 2022-02-04 ENCOUNTER — Ambulatory Visit: Payer: 59 | Admitting: Family Medicine

## 2022-02-06 ENCOUNTER — Other Ambulatory Visit: Payer: Self-pay | Admitting: Family Medicine

## 2022-02-06 NOTE — Telephone Encounter (Signed)
Last office visit 1/9//23 Follow up appointment scheduled 02/13/22 Last refill 12/24/21, #60, 1 refill

## 2022-02-06 NOTE — Telephone Encounter (Signed)
Use ONLY if needed.  He can develop tardive dyskinesia (which might be UNREVERSABLE) if he uses this medication frequently.

## 2022-02-07 NOTE — Telephone Encounter (Signed)
Patient aware and verbalizes understanding. 

## 2022-02-07 NOTE — Telephone Encounter (Signed)
Marvin Espinoza/clinical Can you see what he needs? I'm not sure how to help him get mounjaro since it was denied? Is the copay card for trulicity not working? Does he have high deductible plan? The best thing is for him to call his insurance to let us know what to prescribe for a reasonable price I got looped into his calls, but unsure of what was tried, etc

## 2022-02-08 NOTE — Telephone Encounter (Signed)
No samples at this time

## 2022-02-13 ENCOUNTER — Ambulatory Visit (INDEPENDENT_AMBULATORY_CARE_PROVIDER_SITE_OTHER): Payer: 59 | Admitting: Family Medicine

## 2022-02-13 ENCOUNTER — Encounter: Payer: Self-pay | Admitting: Family Medicine

## 2022-02-13 ENCOUNTER — Telehealth: Payer: Self-pay | Admitting: Pharmacist

## 2022-02-13 VITALS — BP 135/97 | HR 72 | Temp 97.5°F | Ht 70.0 in | Wt 275.8 lb

## 2022-02-13 DIAGNOSIS — E119 Type 2 diabetes mellitus without complications: Secondary | ICD-10-CM | POA: Diagnosis not present

## 2022-02-13 DIAGNOSIS — E1165 Type 2 diabetes mellitus with hyperglycemia: Secondary | ICD-10-CM | POA: Diagnosis not present

## 2022-02-13 DIAGNOSIS — R39198 Other difficulties with micturition: Secondary | ICD-10-CM | POA: Diagnosis not present

## 2022-02-13 LAB — BAYER DCA HB A1C WAIVED: HB A1C (BAYER DCA - WAIVED): 7.7 % — ABNORMAL HIGH (ref 4.8–5.6)

## 2022-02-13 MED ORDER — TAMSULOSIN HCL 0.4 MG PO CAPS: 0.4000 mg | ORAL_CAPSULE | Freq: Every day | ORAL | 3 refills | Status: DC

## 2022-02-13 MED ORDER — TIRZEPATIDE 2.5 MG/0.5ML ~~LOC~~ SOAJ: 2.5000 mg | SUBCUTANEOUS | 2 refills | Status: DC

## 2022-02-13 NOTE — Progress Notes (Signed)
? ?Subjective: ?CC: 1 month follow-up ?PCP: Janora Norlander, DO ?Marvin Espinoza is a 51 y.o. male presenting to clinic today for: ? ?1.  Diabetes ?Patient did not get to start the Trulicity because it was too expensive.  He is asking to see if we have a sample today.  He has failed Victoza in the past secondary to severe heartburn.  He continues to gain weight and is very deflated by this.  He in fact will be seeing his gastric surgeon again to talk about revision surgery. ? ?2.  Decreased urinary flow ?Patient reports decreased urinary flow with increased urinary frequency.  He does not feel like he can empty his bladder fully.  Would like to have prostate level checked today. ? ? ?ROS: Per HPI ? ?Allergies  ?Allergen Reactions  ? Abilify [Aripiprazole] Anaphylaxis and Swelling  ?  "slurred speech" ?FACIAL DROOPING, TONGUE SWELLING, TARDIVE DYSKENESIA ?  ? Caplyta [Lumateperone] Anaphylaxis  ? Seroquel [Quetiapine Fumarate]   ?  tardive dyskinesia  ? Alcohol Itching  ?  Drinking alcohol  ? Ambien [Zolpidem] Other (See Comments)  ?  NIGHT TERRORS  ? Anacin-3 [Acetaminophen] Itching  ?  REGULAR TYLENOL IS OKAY.  ?NEEDS BENADRYL TO TAKE THIS  ? Cariprazine Other (See Comments)  ?  Tardive dyskenesia  ? Cariprazine Hcl Itching  ? Anette Guarneri [Lurasidone Hcl] Other (See Comments)  ?  RUNS SUGAR TOO HIGH  ? Lurasidone Other (See Comments)  ?  TARDIVE DYSKINESIA  ? Prozac [Fluoxetine] Itching  ? Victoza [Liraglutide] Other (See Comments)  ?  Severe heartburn ?  ? Bupropion Rash  ? Hydrocodone-Acetaminophen Itching  ? Risperidone Rash  ? Tegretol [Carbamazepine] Rash  ? ?Past Medical History:  ?Diagnosis Date  ? Anxiety   ? Bipolar disorder ([place])   ? CTS (carpal tunnel syndrome)   ? Depression   ? Diabetes mellitus without complication ([place])   ? GERD (gastroesophageal reflux disease)   ? H/O bariatric surgery 2016  ? Head injury 05/06/2017  ? Hypothyroidism   ? Hypothyroidism   ? Sleep apnea   ? no longer with OSA d/t  over 100 pd weight loss  ? ? ?Current Outpatient Medications:  ?  ALPRAZolam (XANAX) 1 MG tablet, 1 mg. Six times per day prn, Disp: , Rfl:  ?  Blood Glucose Monitoring Suppl (ONETOUCH VERIO) w/Device KIT, Use to test blood sugar daily as directed, Disp: 1 kit, Rfl: 0 ?  Calcium-Vitamin D-Vitamin K (SM CALCIUM SOFT CHEWS PO), Take 1 application by mouth 2 (two) times daily. Chew calcium tabs twice a day.  (bariatric surgery), Disp: , Rfl:  ?  docusate sodium (COLACE) 100 MG capsule, Take 100 mg by mouth 2 (two) times daily., Disp: , Rfl:  ?  Dulaglutide (TRULICITY) 9.17 HX/5.0VW SOPN, Inject 0.75 mg into the skin once a week., Disp: 2 mL, Rfl: 3 ?  gabapentin (NEURONTIN) 300 MG capsule, Take 1 capsule (300 mg total) by mouth 3 (three) times daily., Disp: 270 capsule, Rfl: 3 ?  glucose blood test strip, test blood sugar daily as directed Dx E11.9, Disp: 100 each, Rfl: 3 ?  lamoTRIgine (LAMICTAL) 200 MG tablet, Take 400 mg by mouth at bedtime. , Disp: , Rfl:  ?  levothyroxine (SYNTHROID) 150 MCG tablet, Take 1 tablet (150 mcg total) by mouth daily. Alternate with 133mg, Disp: 90 tablet, Rfl: 3 ?  levothyroxine (SYNTHROID) 175 MCG tablet, Take 1 tablet (175 mcg total) by mouth daily. Alternate with 150 mcg, Disp: 90  tablet, Rfl: 3 ?  loratadine (CLARITIN) 10 MG tablet, Take 1 tablet (10 mg total) by mouth daily., Disp: 30 tablet, Rfl: 11 ?  metFORMIN (GLUCOPHAGE) 1000 MG tablet, Take 1 tablet (1,000 mg total) by mouth 2 (two) times daily with a meal., Disp: 180 tablet, Rfl: 3 ?  Multiple Vitamin (MULTIVITAMIN WITH MINERALS) TABS tablet, Take 1 tablet by mouth 2 (two) times daily. For Vitamin supplementation (Patient taking differently: Take 1 tablet by mouth 2 (two) times daily. For Vitamin supplementation FROM GASTRIC BYPASS), Disp: , Rfl:  ?  OLANZapine-Samidorphan (LYBALVI PO), Take by mouth., Disp: , Rfl:  ?  OneTouch Delica Lancets 17P MISC, Use to test blood sugar daily as directed, Disp: 100 each, Rfl: 3 ?   pantoprazole (PROTONIX) 40 MG tablet, TAKE ONE TABLET BY MOUTH TWICE DAILY, Disp: 180 tablet, Rfl: 0 ?  promethazine (PHENERGAN) 12.5 MG tablet, TAKE 1 TO 2 TABLETS EVERY 8 HOURS AS NEEDED FOR NAUSEA AND VOMITING, Disp: 60 tablet, Rfl: 1 ?  psyllium (METAMUCIL) 58.6 % powder, Take 1 packet by mouth 2 (two) times daily., Disp: , Rfl:  ?  rosuvastatin (CRESTOR) 5 MG tablet, Take 1 tablet (5 mg total) by mouth daily., Disp: 90 tablet, Rfl: 3 ?  tiZANidine (ZANAFLEX) 4 MG tablet, Take 1 tablet (4 mg total) by mouth every 8 (eight) hours as needed for muscle spasms., Disp: 90 tablet, Rfl: 3 ?  vitamin B-12 (CYANOCOBALAMIN) 250 MCG tablet, Take 1 tablet (250 mcg total) by mouth daily. For low B-12 supplementation, Disp: 1 tablet, Rfl:  ?Social History  ? ?Socioeconomic History  ? Marital status: Married  ?  Spouse name: Mickel Baas  ? Number of children: 1  ? Years of education: Not on file  ? Highest education level: Not on file  ?Occupational History  ? Not on file  ?Tobacco Use  ? Smoking status: Never  ? Smokeless tobacco: Never  ?Vaping Use  ? Vaping Use: Never used  ?Substance and Sexual Activity  ? Alcohol use: No  ?  Comment: severe allergy to alcohol!!  ? Drug use: No  ? Sexual activity: Yes  ?Other Topics Concern  ? Not on file  ?Social History Narrative  ? Patient lives in mothers' home. He stays alone until nighttime when wife comes up.   ? He stays there to keep an eye on the house.   ? His son comes to visit also.  ?   ?    ? ?Social Determinants of Health  ? ?Financial Resource Strain: Not on file  ?Food Insecurity: Not on file  ?Transportation Needs: Not on file  ?Physical Activity: Not on file  ?Stress: Not on file  ?Social Connections: Not on file  ?Intimate Partner Violence: Not on file  ? ?Family History  ?Problem Relation Age of Onset  ? Diabetes Mother   ? Heart disease Mother   ? Depression Mother   ? Esophageal cancer Mother   ? COPD Father   ? Heart disease Father   ? Kidney disease Father   ? Mental  illness Father   ? Depression Father   ? Mental illness Sister   ? Depression Sister   ? Esophageal cancer Maternal Uncle   ? Colon cancer Neg Hx   ? Rectal cancer Neg Hx   ? Stomach cancer Neg Hx   ? ? ?Objective: ?Office vital signs reviewed. ?BP (!) 138/93   Pulse 72   Temp (!) 97.5 ?F (36.4 ?C)   Ht 5'  10" (1.778 m)   Wt 275 lb 12.8 oz (125.1 kg)   SpO2 95%   BMI 39.57 kg/m?  ? ?Physical Examination:  ?General: Awake, alert, morbidly obese, No acute distress ?Cardio: RRR ?Pulmonary: normal WOB on room air, no wheezes ?Psych: Anxious ? ?Assessment/ Plan: ?51 y.o. male  ? ?Uncontrolled type 2 diabetes mellitus with hyperglycemia ([place]) - Plan: Bayer DCA Hb A1c Waived ? ?Decreased urine stream - Plan: tamsulosin (FLOMAX) 0.4 MG CAPS capsule, PSA ? ?Sugar remains uncontrolled with A1c of 7.7 today.  I gave him a sample of Trulicity today and we injected it together.  I will CC Almyra Free to see if we can continue pursuing medication, but Mounjaro if Trulicity is not tolerated. ?We will plan to see him back in 1 month for tolerance of medication and weight recheck ? ?As far as his decreased urine stream PSA was ordered.  Start Flomax.  Monitor blood pressure.  We will reconvene in a month for recheck ? ?Orders Placed This Encounter  ?Procedures  ? Bayer DCA Hb A1c Waived  ? ?No orders of the defined types were placed in this encounter. ? ? ? ?Janora Norlander, DO ?[place] ?[phone] ? ?

## 2022-02-13 NOTE — Telephone Encounter (Signed)
Trulicity samples given ?Not controlling Bgs as we would like ?PA submitted for Mounjaro--must try/fail trulicity and victoza ?Unable to obtain trulicity ?

## 2022-02-13 NOTE — Telephone Encounter (Signed)
Mounjaro approved ?Tried and failed victoza/trulicity (and/OR allergic to) ?Patient and pharmacy called ? ?

## 2022-02-14 ENCOUNTER — Encounter: Payer: Self-pay | Admitting: Family Medicine

## 2022-02-20 NOTE — Telephone Encounter (Signed)
Please get this gentleman scheudled for a visit with Almyra Free. ?

## 2022-02-22 ENCOUNTER — Other Ambulatory Visit: Payer: Self-pay | Admitting: Family Medicine

## 2022-02-25 DIAGNOSIS — E039 Hypothyroidism, unspecified: Secondary | ICD-10-CM | POA: Diagnosis not present

## 2022-03-02 ENCOUNTER — Other Ambulatory Visit: Payer: Self-pay | Admitting: Family Medicine

## 2022-03-08 ENCOUNTER — Encounter: Payer: Self-pay | Admitting: Family Medicine

## 2022-03-08 DIAGNOSIS — B372 Candidiasis of skin and nail: Secondary | ICD-10-CM

## 2022-03-08 MED ORDER — GLUCOSE BLOOD VI STRP: ORAL_STRIP | 3 refills | Status: AC

## 2022-03-08 NOTE — Telephone Encounter (Signed)
Ok to change

## 2022-03-09 DIAGNOSIS — L309 Dermatitis, unspecified: Secondary | ICD-10-CM | POA: Diagnosis not present

## 2022-03-09 DIAGNOSIS — L299 Pruritus, unspecified: Secondary | ICD-10-CM | POA: Diagnosis not present

## 2022-03-11 ENCOUNTER — Other Ambulatory Visit: Payer: Self-pay | Admitting: Family Medicine

## 2022-03-12 ENCOUNTER — Ambulatory Visit (INDEPENDENT_AMBULATORY_CARE_PROVIDER_SITE_OTHER): Payer: 59 | Admitting: Pharmacist

## 2022-03-12 DIAGNOSIS — E119 Type 2 diabetes mellitus without complications: Secondary | ICD-10-CM | POA: Diagnosis not present

## 2022-03-12 MED ORDER — STEGLATRO 15 MG PO TABS: 15.0000 mg | ORAL_TABLET | Freq: Every day | ORAL | 5 refills | Status: DC

## 2022-03-12 NOTE — Progress Notes (Signed)
? ? ?  03/12/2022 ?Name: Marvin Espinoza MRN: [id number] DOB: [birth year] ? ? ?S:  51 yoM Presents for diabetes evaluation, education, and management ?Patient was referred and last seen by Primary Care Provider on 02/13/22 ? ?Insurance coverage/medication affordability: aetna/cvs high deductible ? ?Patient reports adherence with medications. ?Current diabetes medications include: metformin ?Intolerances: Victoza ?High deductible PLAN--unable to get any GLP1, DPP4 or SGLT2: I.e., mounjaro, Trulicity, rybelsus (tried all) ?  ?Patient denies hypoglycemic events. ?  ?Patient reported dietary habits: Eats 3 meals/day ? ?Patient-reported exercise habits: encouraged ? ? ?Patient reports nocturia (nighttime urination). ? ?O: ? ?Lab Results  ?Component Value Date  ? HGBA1C 7.7 (H) 02/13/2022  ? ? ?Lipid Panel ? ?   ?Component Value Date/Time  ? CHOL 141 04/30/2021 0836  ? TRIG 75 04/30/2021 0836  ? HDL 47 04/30/2021 0836  ? CHOLHDL 3.0 04/30/2021 0836  ? CHOLHDL 3.8 01/27/2020 0617  ? VLDL 20 01/27/2020 0617  ? [place] 79 04/30/2021 0836  ? ? ? Home fasting blood sugars: 200s ? ?2 hour post-meal/random blood sugars: n/a. ?  ? ?Clinical Atherosclerotic Cardiovascular Disease (ASCVD): No  ? ?The 10-year ASCVD risk score (Arnett DK, et al., 2019) is: 5.9% ?  Values used to calculate the score: ?    Age: 51 years ?    Sex: Male ?    Is Non-Hispanic African American: No ?    Diabetic: Yes ?    Tobacco smoker: No ?    Systolic Blood Pressure: 657 mmHg ?    Is BP treated: No ?    HDL Cholesterol: 47 mg/dL ?    Total Cholesterol: 141 mg/dL ?  ? ?A/P: ? ?Diabetes T2DM currently UNCONTROLLED.  Patient is also on prednisone recently--sugars are increasing.  Patient is adherent with medication. Control is suboptimal due to high deductible plan/lifestyle management. ? ?-Start tresiba low dose--10 units nightly ? ?-continue metformin ? ?-try to obtain SGLT2 [place] using copay card (working on [place]) ? ? ?-Extensively discussed pathophysiology of  diabetes, recommended lifestyle interventions, dietary effects on blood sugar control ? ?-Counseled on s/sx of and management of hypoglycemia ? ?-Next A1C anticipated 3 months. ? ?Written patient instructions provided.  Total time in face to face counseling 40 minutes.  ? ? ? ?Regina Eck, PharmD, BCPS ?Clinical Pharmacist, [facility] ?[place]  [place] Phone [phone] ? ? ?

## 2022-03-13 ENCOUNTER — Telehealth: Payer: Self-pay | Admitting: Pharmacist

## 2022-03-13 NOTE — Telephone Encounter (Signed)
STEGLATRO APPROVED CALLED PATIENT AND WIFE ? ?THEY WILL CALL PHARMACY ?

## 2022-03-18 ENCOUNTER — Ambulatory Visit: Payer: 59 | Admitting: Family Medicine

## 2022-03-18 ENCOUNTER — Encounter: Payer: Self-pay | Admitting: Family Medicine

## 2022-03-18 VITALS — BP 117/84 | HR 92 | Temp 97.5°F | Ht 70.0 in | Wt 266.0 lb

## 2022-03-18 DIAGNOSIS — B372 Candidiasis of skin and nail: Secondary | ICD-10-CM

## 2022-03-18 DIAGNOSIS — R39198 Other difficulties with micturition: Secondary | ICD-10-CM | POA: Diagnosis not present

## 2022-03-18 DIAGNOSIS — R351 Nocturia: Secondary | ICD-10-CM | POA: Diagnosis not present

## 2022-03-18 DIAGNOSIS — E1165 Type 2 diabetes mellitus with hyperglycemia: Secondary | ICD-10-CM

## 2022-03-18 MED ORDER — KETOCONAZOLE 2 % EX CREA: 1.0000 "application " | TOPICAL_CREAM | Freq: Every day | CUTANEOUS | 0 refills | Status: DC

## 2022-03-18 NOTE — Progress Notes (Signed)
? ?Subjective: ?CC:DM ?PCP: Janora Norlander, DO ?RCV:ELFYBOF Marvin Espinoza is a 51 y.o. male presenting to clinic today for: ? ?1.  Diabetes ?Patient was recently started on Steglatro.  He so far seems to be tolerating this without difficulty but admits that he still has frequent urination that preceded use of this medication.  He is injecting [place].  Mounjaro and other GLP/GIP's were not affordable costing well over $400 per month.  He is currently working with our clinical pharmacist to [place] through a patient assistance program over the next year.  He is compliant with his metformin.  He needs a sample of the [place] if available.  No reports of hypoglycemia.  He is still waiting on insurance approval of his new psychiatric medication, which does not tend to raise his blood sugar ? ?2.  Rash ?Patient reports a rash under bilateral arms in the axillary region.  He was evaluated for this at an urgent care and was given an over-the-counter antifungal.  He notes slight improvement and resolution of the itching but the rash is still present. ? ?3.  BPH ?Patient uses Flomax daily.  He continues to have frequent nocturia.  He reports intermittent orthostasis. ? ? ?ROS: Per HPI ? ?Allergies  ?Allergen Reactions  ? Abilify [Aripiprazole] Anaphylaxis and Swelling  ?  "slurred speech" ?FACIAL DROOPING, TONGUE SWELLING, TARDIVE DYSKENESIA ?  ? Caplyta [Lumateperone] Anaphylaxis  ? Seroquel [Quetiapine Fumarate]   ?  tardive dyskinesia  ? Alcohol Itching  ?  Drinking alcohol  ? Ambien [Zolpidem] Other (See Comments)  ?  NIGHT TERRORS  ? Anacin-3 [Acetaminophen] Itching  ?  REGULAR TYLENOL IS OKAY.  ?NEEDS BENADRYL TO TAKE THIS  ? Cariprazine Other (See Comments)  ?  Tardive dyskenesia  ? Cariprazine Hcl Itching  ? Anette Guarneri [Lurasidone Hcl] Other (See Comments)  ?  RUNS SUGAR TOO HIGH  ? Lurasidone Other (See Comments)  ?  TARDIVE DYSKINESIA  ? Prozac [Fluoxetine] Itching  ? Victoza [Liraglutide] Other (See Comments)   ?  Severe heartburn ?  ? Bupropion Rash  ? Hydrocodone-Acetaminophen Itching  ? Risperidone Rash  ? Tegretol [Carbamazepine] Rash  ? ?Past Medical History:  ?Diagnosis Date  ? Anxiety   ? Bipolar disorder ([place])   ? CTS (carpal tunnel syndrome)   ? Depression   ? Diabetes mellitus without complication ([place])   ? GERD (gastroesophageal reflux disease)   ? H/O bariatric surgery 2016  ? Head injury 05/06/2017  ? Hypothyroidism   ? Hypothyroidism   ? Sleep apnea   ? no longer with OSA d/t over 100 pd weight loss  ? ? ?Current Outpatient Medications:  ?  ALPRAZolam (XANAX) 1 MG tablet, 1 mg. Six times per day prn, Disp: , Rfl:  ?  Blood Glucose Monitoring Suppl (ONETOUCH VERIO) w/Device KIT, Use to test blood sugar daily as directed, Disp: 1 kit, Rfl: 0 ?  Calcium-Vitamin D-Vitamin K (SM CALCIUM SOFT CHEWS PO), Take 1 application by mouth 2 (two) times daily. Chew calcium tabs twice a day.  (bariatric surgery), Disp: , Rfl:  ?  docusate sodium (COLACE) 100 MG capsule, Take 100 mg by mouth 2 (two) times daily., Disp: , Rfl:  ?  ertugliflozin L-PyroglutamicAc (STEGLATRO) 15 MG TABS tablet, Take 1 tablet (15 mg total) by mouth daily before breakfast., Disp: 30 tablet, Rfl: 5 ?  gabapentin (NEURONTIN) 300 MG capsule, Take 1 capsule (300 mg total) by mouth 3 (three) times daily., Disp: 270 capsule, Rfl: 3 ?  glucose blood test strip, test blood sugar 2x daily Dx E11.9, Disp: 100 each, Rfl: 3 ?  insulin degludec (TRESIBA FLEXTOUCH) 100 UNIT/ML FlexTouch Pen, Inject 10 Units into the skin at bedtime., Disp: , Rfl:  ?  lamoTRIgine (LAMICTAL) 200 MG tablet, Take 400 mg by mouth at bedtime. , Disp: , Rfl:  ?  levothyroxine (SYNTHROID) 150 MCG tablet, Take 1 tablet (150 mcg total) by mouth daily. Alternate with 144mg, Disp: 90 tablet, Rfl: 3 ?  levothyroxine (SYNTHROID) 175 MCG tablet, Take 1 tablet (175 mcg total) by mouth daily. Alternate with 150 mcg, Disp: 90 tablet, Rfl: 3 ?  loratadine (CLARITIN) 10 MG tablet, Take 1 tablet  (10 mg total) by mouth daily., Disp: 30 tablet, Rfl: 11 ?  metFORMIN (GLUCOPHAGE) 1000 MG tablet, Take 1 tablet (1,000 mg total) by mouth 2 (two) times daily with a meal., Disp: 180 tablet, Rfl: 3 ?  Multiple Vitamin (MULTIVITAMIN WITH MINERALS) TABS tablet, Take 1 tablet by mouth 2 (two) times daily. For Vitamin supplementation (Patient taking differently: Take 1 tablet by mouth 2 (two) times daily. For Vitamin supplementation FROM GASTRIC BYPASS), Disp: , Rfl:  ?  OLANZapine-Samidorphan (LYBALVI PO), Take by mouth., Disp: , Rfl:  ?  OneTouch Delica Lancets 338GMISC, Use to test blood sugar daily as directed, Disp: 100 each, Rfl: 3 ?  pantoprazole (PROTONIX) 40 MG tablet, TAKE ONE TABLET BY MOUTH TWICE DAILY, Disp: 180 tablet, Rfl: 0 ?  promethazine (PHENERGAN) 12.5 MG tablet, TAKE 1 TO 2 TABLETS EVERY 8 HOURS AS NEEDED FOR NAUSEA AND VOMITING, Disp: 60 tablet, Rfl: 1 ?  psyllium (METAMUCIL) 58.6 % powder, Take 1 packet by mouth 2 (two) times daily., Disp: , Rfl:  ?  rosuvastatin (CRESTOR) 5 MG tablet, TAKE ONE (1) TABLET BY MOUTH EVERY DAY, Disp: 90 tablet, Rfl: 0 ?  tamsulosin (FLOMAX) 0.4 MG CAPS capsule, Take 1 capsule (0.4 mg total) by mouth daily., Disp: 30 capsule, Rfl: 3 ?  tirzepatide (MOUNJARO) 2.5 MG/0.5ML Pen, Inject 2.5 mg into the skin once a week. DX: E11.65 T2DM, Disp: 2 mL, Rfl: 2 ?  tiZANidine (ZANAFLEX) 4 MG tablet, Take 1 tablet (4 mg total) by mouth every 8 (eight) hours as needed for muscle spasms., Disp: 90 tablet, Rfl: 3 ?  vitamin B-12 (CYANOCOBALAMIN) 250 MCG tablet, Take 1 tablet (250 mcg total) by mouth daily. For low B-12 supplementation, Disp: 1 tablet, Rfl:  ?Social History  ? ?Socioeconomic History  ? Marital status: Married  ?  Spouse name: LMickel Baas ? Number of children: 1  ? Years of education: Not on file  ? Highest education level: Not on file  ?Occupational History  ? Not on file  ?Tobacco Use  ? Smoking status: Never  ? Smokeless tobacco: Never  ?Vaping Use  ? Vaping Use: Never  used  ?Substance and Sexual Activity  ? Alcohol use: No  ?  Comment: severe allergy to alcohol!!  ? Drug use: No  ? Sexual activity: Yes  ?Other Topics Concern  ? Not on file  ?Social History Narrative  ? Patient lives in mothers' home. He stays alone until nighttime when wife comes up.   ? He stays there to keep an eye on the house.   ? His son comes to visit also.  ?   ?    ? ?Social Determinants of Health  ? ?Financial Resource Strain: Not on file  ?Food Insecurity: Not on file  ?Transportation Needs: Not on file  ?Physical  Activity: Not on file  ?Stress: Not on file  ?Social Connections: Not on file  ?Intimate Partner Violence: Not on file  ? ?Family History  ?Problem Relation Age of Onset  ? Diabetes Mother   ? Heart disease Mother   ? Depression Mother   ? Esophageal cancer Mother   ? COPD Father   ? Heart disease Father   ? Kidney disease Father   ? Mental illness Father   ? Depression Father   ? Mental illness Sister   ? Depression Sister   ? Esophageal cancer Maternal Uncle   ? Colon cancer Neg Hx   ? Rectal cancer Neg Hx   ? Stomach cancer Neg Hx   ? ? ?Objective: ?Office vital signs reviewed. ?BP 117/84   Pulse 92   Temp (!) 97.5 ?F (36.4 ?C)   Ht _0  (1.778 m)   Wt 266 lb (120.7 kg)   SpO2 96%   BMI 38.17 kg/m?  ? ?Physical Examination:  ?General: Awake, alert, morbidly obese, No acute distress ?HEENT: Sclera white ?Cardio: regular rate and rhythm, S1S2 heard, no murmurs appreciated ?Pulm: clear to auscultation bilaterally, no wheezes, rhonchi or rales; normal work of breathing on room air ?Skin: He has a mildly erythematous rash that appears to be almost plaque-like under bilateral axilla.  No satellite lesions, exudate, induration or tenderness to the regions. ? ?Assessment/ Plan: ?51 y.o. male  ? ?Nocturia - Plan: PSA, PSA ? ?Candidal intertrigo - Plan: ketoconazole (NIZORAL) 2 % cream ? ?Uncontrolled type 2 diabetes mellitus with hyperglycemia ([place]) ? ?Check PSA given nocturia.  I do worry  that his increased urinary frequency will be exacerbated by the [facility] but as of right now it does not seem to be exacerbating his urinary symptoms.  We will plan for A1c in the next 2 months, sooner if conc

## 2022-03-18 NOTE — Patient Instructions (Signed)
Intertrigo °Intertrigo is skin irritation (inflammation) that happens in warm, moist areas of the body. The irritation can cause a rash and make skin raw and itchy. The rash is usually pink or red. It happens mostly between folds of skin or where skin rubs together, such as: °Between the toes. °In the armpits. °In the groin area. °Under the belly. °Under the breasts. °Around the butt area. °This condition is not passed from person to person (is not contagious). °What are the causes? °Heat, moisture, rubbing, and not enough air movement. °The condition can be made worse by: °Sweat. °Bacteria. °A fungus, such as yeast. °What increases the risk? °Moisture in your skin folds. °You are more likely to develop this condition if you: °Have diabetes. °Are overweight. °Are not able to move around. °Live in a warm and moist climate. °Wear splints, braces, or other medical devices. °Are not able to control your pee (urine) or poop (stool). °What are the signs or symptoms? °A pink or red skin rash in the skin fold or near the skin fold. °Raw or scaly skin. °Itching. °A burning feeling. °Bleeding. °Leaking fluid. °A bad smell. °How is this treated? °Cleaning and drying your skin. °Taking an antibiotic medicine or using an antibiotic skin cream for a bacterial infection. °Using an antifungal cream on your skin or taking pills for an infection that was caused by a fungus, such as yeast. °Using a steroid ointment to stop the itching and irritation. °Separating the skin fold with a clean cotton cloth to absorb moisture and allow air to flow into the area. °Follow these instructions at home: °Keep the affected area clean and dry. °Do not scratch your skin. °Stay cool as much as you can. Use an air conditioner or a fan, if you have one. °Apply over-the-counter and prescription medicines only as told by your doctor. °If you were prescribed an antibiotic medicine, use it as told by your doctor. Do not stop using the antibiotic even if  your condition starts to get better. °Keep all follow-up visits as told by your doctor. This is important. °How is this prevented? ° °Stay at a healthy weight. °Take care of your feet. This is very important if you have diabetes. You should: °Wear shoes that fit well. °Keep your feet dry. °Wear clean cotton or wool socks. °Protect the skin in your groin and butt area as told by your doctor. To do this: °Follow a regular cleaning routine. °Use creams, powders, or ointments that protect your skin. °Change protection pads often. °Do not wear tight clothes. Wear clothes that: °Are loose. °Take moisture away from your body. °Are made of cotton. °Wear a bra that gives good support, if needed. °Shower and dry yourself well after being active. Use a hair dryer on a cool setting to dry between skin folds. °Keep your blood sugar under control if you have diabetes. °Contact a doctor if: °Your symptoms do not get better with treatment. °Your symptoms get worse or they spread. °You notice more redness and warmth. °You have a fever. °Summary °Intertrigo is skin irritation that occurs when folds of skin rub together. °This condition is caused by heat, moisture, and rubbing. °This condition may be treated by cleaning and drying your skin and with medicines. °Apply over-the-counter and prescription medicines only as told by your doctor. °Keep all follow-up visits as told by your doctor. This is important. °This information is not intended to replace advice given to you by your health care provider. Make sure you discuss   any questions you have with your health care provider. °Document Revised: 09/10/2018 Document Reviewed: 09/10/2018 °Elsevier Patient Education © 2022 [employer] ° °

## 2022-03-19 LAB — PSA: Prostate Specific Ag, Serum: 0.2 ng/mL (ref 0.0–4.0)

## 2022-03-19 NOTE — Progress Notes (Signed)
Patient returning call. Please call back

## 2022-03-21 ENCOUNTER — Telehealth: Payer: Self-pay | Admitting: Family Medicine

## 2022-03-21 MED ORDER — NYSTATIN 100000 UNIT/GM EX CREA: 1.0000 "application " | TOPICAL_CREAM | Freq: Two times a day (BID) | CUTANEOUS | 0 refills | Status: DC

## 2022-03-21 MED ORDER — NYSTATIN-TRIAMCINOLONE 100000-0.1 UNIT/GM-% EX OINT: 1.0000 "application " | TOPICAL_OINTMENT | Freq: Two times a day (BID) | CUTANEOUS | 0 refills | Status: DC

## 2022-03-21 MED ORDER — TRIAMCINOLONE ACETONIDE 0.1 % EX CREA: 1.0000 "application " | TOPICAL_CREAM | Freq: Two times a day (BID) | CUTANEOUS | 0 refills | Status: DC

## 2022-03-21 NOTE — Telephone Encounter (Signed)
Please let patient know I had to send in two different tubes, but that he can mix them together and then apply to the area. ?

## 2022-03-21 NOTE — Telephone Encounter (Signed)
Pt aware.

## 2022-03-26 ENCOUNTER — Encounter: Payer: Self-pay | Admitting: Family Medicine

## 2022-03-27 ENCOUNTER — Other Ambulatory Visit: Payer: 59

## 2022-03-27 ENCOUNTER — Other Ambulatory Visit: Payer: Self-pay

## 2022-03-27 DIAGNOSIS — E039 Hypothyroidism, unspecified: Secondary | ICD-10-CM

## 2022-03-27 DIAGNOSIS — T1502XA Foreign body in cornea, left eye, initial encounter: Secondary | ICD-10-CM | POA: Diagnosis not present

## 2022-03-27 DIAGNOSIS — I519 Heart disease, unspecified: Secondary | ICD-10-CM | POA: Diagnosis not present

## 2022-03-28 LAB — TSH: TSH: 4.91 u[IU]/mL — ABNORMAL HIGH (ref 0.450–4.500)

## 2022-03-28 LAB — THYROID PEROXIDASE ANTIBODY: Thyroperoxidase Ab SerPl-aCnc: 118 IU/mL — ABNORMAL HIGH (ref 0–34)

## 2022-04-02 DIAGNOSIS — T1502XA Foreign body in cornea, left eye, initial encounter: Secondary | ICD-10-CM | POA: Diagnosis not present

## 2022-04-04 ENCOUNTER — Other Ambulatory Visit: Payer: Self-pay | Admitting: Family Medicine

## 2022-04-04 DIAGNOSIS — M5136 Other intervertebral disc degeneration, lumbar region: Secondary | ICD-10-CM

## 2022-04-05 ENCOUNTER — Encounter: Payer: Self-pay | Admitting: Family Medicine

## 2022-04-05 ENCOUNTER — Telehealth: Payer: Self-pay | Admitting: Family Medicine

## 2022-04-05 NOTE — Telephone Encounter (Signed)
Unless you have something you can think of, glad to send in basaglar. ?

## 2022-04-05 NOTE — Telephone Encounter (Signed)
Insurance will not cover insulin and patient is wanting to know if he will be able to get more samples. He was given samples by Almyra Free and wife is unsure of the name. Please call back and advise.  ?

## 2022-04-05 NOTE — Telephone Encounter (Signed)
Pt wife aware

## 2022-04-05 NOTE — Telephone Encounter (Signed)
Marvin Espinoza is fine to give! ?Plenty of samples in [place] ?

## 2022-04-09 ENCOUNTER — Other Ambulatory Visit: Payer: Self-pay | Admitting: Family Medicine

## 2022-04-18 ENCOUNTER — Encounter: Payer: Self-pay | Admitting: Family Medicine

## 2022-04-28 ENCOUNTER — Encounter: Payer: Self-pay | Admitting: Family Medicine

## 2022-04-29 NOTE — Telephone Encounter (Signed)
He needs OV. Please schedule accordingly. ?

## 2022-04-30 ENCOUNTER — Other Ambulatory Visit: Payer: Self-pay | Admitting: Family Medicine

## 2022-04-30 ENCOUNTER — Ambulatory Visit (INDEPENDENT_AMBULATORY_CARE_PROVIDER_SITE_OTHER): Payer: 59 | Admitting: Family Medicine

## 2022-04-30 DIAGNOSIS — B9689 Other specified bacterial agents as the cause of diseases classified elsewhere: Secondary | ICD-10-CM

## 2022-04-30 DIAGNOSIS — J019 Acute sinusitis, unspecified: Secondary | ICD-10-CM

## 2022-04-30 DIAGNOSIS — R39198 Other difficulties with micturition: Secondary | ICD-10-CM

## 2022-04-30 MED ORDER — AMOXICILLIN-POT CLAVULANATE 875-125 MG PO TABS: 1.0000 | ORAL_TABLET | Freq: Two times a day (BID) | ORAL | 0 refills | Status: DC

## 2022-04-30 NOTE — Progress Notes (Signed)
Telephone visit ? ?Subjective: ?[id number] ?PCP: Janora Norlander, DO ?RDE:Marvin Espinoza is a 51 y.o. male calls for telephone consult today. Patient provides verbal consent for consult held via phone. ? ?Due to COVID-19 pandemic this visit was conducted virtually. This visit type was conducted due to national recommendations for restrictions regarding the COVID-19 Pandemic (e.g. social distancing, sheltering in place) in an effort to limit this patient's exposure and mitigate transmission in our community. All issues noted in this document were discussed and addressed.  A physical exam was not performed with this format.  ? ?Location of patient: home ?Location of provider: [facility] ?Others present for call: none ? ?1. URI/ sinusitis ?Patient reports that he has been experiencing sinus headache, nasal drainage and throat irritation.  He has been using OTC allergy meds and pseudofed and nothing is improving.  His son and wife are ill with similar.  Symptoms seem to be worsening.  Negative home COVID test. No fever, shortness of breath, wheezing.  Minimal cough. ? ?ROS: Per HPI ? ?Allergies  ?Allergen Reactions  ? Abilify [Aripiprazole] Anaphylaxis and Swelling  ?  "slurred speech" ?FACIAL DROOPING, TONGUE SWELLING, TARDIVE DYSKENESIA ?  ? Caplyta [Lumateperone] Anaphylaxis  ? Seroquel [Quetiapine Fumarate]   ?  tardive dyskinesia  ? Alcohol Itching  ?  Drinking alcohol  ? Ambien [Zolpidem] Other (See Comments)  ?  NIGHT TERRORS  ? Anacin-3 [Acetaminophen] Itching  ?  REGULAR TYLENOL IS OKAY.  ?NEEDS BENADRYL TO TAKE THIS  ? Cariprazine Other (See Comments)  ?  Tardive dyskenesia  ? Cariprazine Hcl Itching  ? Anette Guarneri [Lurasidone Hcl] Other (See Comments)  ?  RUNS SUGAR TOO HIGH  ? Lurasidone Other (See Comments)  ?  TARDIVE DYSKINESIA  ? Prozac [Fluoxetine] Itching  ? Victoza [Liraglutide] Other (See Comments)  ?  Severe heartburn ?  ? Bupropion Rash  ? Hydrocodone-Acetaminophen Itching  ? Risperidone Rash  ?  Tegretol [Carbamazepine] Rash  ? ?Past Medical History:  ?Diagnosis Date  ? Anxiety   ? Bipolar disorder ([place])   ? CTS (carpal tunnel syndrome)   ? Depression   ? Diabetes mellitus without complication ([place])   ? GERD (gastroesophageal reflux disease)   ? H/O bariatric surgery 2016  ? Head injury 05/06/2017  ? Hypothyroidism   ? Hypothyroidism   ? Sleep apnea   ? no longer with OSA d/t over 100 pd weight loss  ? ? ?Current Outpatient Medications:  ?  ALPRAZolam (XANAX) 1 MG tablet, 1 mg. Six times per day prn, Disp: , Rfl:  ?  Blood Glucose Monitoring Suppl (ONETOUCH VERIO) w/Device KIT, Use to test blood sugar daily as directed, Disp: 1 kit, Rfl: 0 ?  Calcium-Vitamin D-Vitamin K (SM CALCIUM SOFT CHEWS PO), Take 1 application by mouth 2 (two) times daily. Chew calcium tabs twice a day.  (bariatric surgery), Disp: , Rfl:  ?  docusate sodium (COLACE) 100 MG capsule, Take 100 mg by mouth 2 (two) times daily., Disp: , Rfl:  ?  ertugliflozin L-PyroglutamicAc (STEGLATRO) 15 MG TABS tablet, Take 1 tablet (15 mg total) by mouth daily before breakfast., Disp: 30 tablet, Rfl: 5 ?  gabapentin (NEURONTIN) 300 MG capsule, Take 1 capsule (300 mg total) by mouth 3 (three) times daily., Disp: 270 capsule, Rfl: 3 ?  glucose blood test strip, test blood sugar 2x daily Dx E11.9, Disp: 100 each, Rfl: 3 ?  insulin degludec (TRESIBA FLEXTOUCH) 100 UNIT/ML FlexTouch Pen, Inject 10 Units into the skin at bedtime., Disp: ,  Rfl:  ?  lamoTRIgine (LAMICTAL) 200 MG tablet, Take 400 mg by mouth at bedtime. , Disp: , Rfl:  ?  levothyroxine (SYNTHROID) 150 MCG tablet, Take 1 tablet (150 mcg total) by mouth daily. Alternate with 160mg, Disp: 90 tablet, Rfl: 3 ?  levothyroxine (SYNTHROID) 175 MCG tablet, Take 1 tablet (175 mcg total) by mouth daily. Alternate with 150 mcg, Disp: 90 tablet, Rfl: 3 ?  loratadine (CLARITIN) 10 MG tablet, Take 1 tablet (10 mg total) by mouth daily., Disp: 30 tablet, Rfl: 11 ?  metFORMIN (GLUCOPHAGE) 1000 MG tablet, Take  1 tablet (1,000 mg total) by mouth 2 (two) times daily with a meal., Disp: 180 tablet, Rfl: 3 ?  Multiple Vitamin (MULTIVITAMIN WITH MINERALS) TABS tablet, Take 1 tablet by mouth 2 (two) times daily. For Vitamin supplementation (Patient taking differently: Take 1 tablet by mouth 2 (two) times daily. For Vitamin supplementation FROM GASTRIC BYPASS), Disp: , Rfl:  ?  nystatin cream (MYCOSTATIN), APPLY TWICE DAILY, Disp: 30 g, Rfl: 0 ?  OLANZapine-Samidorphan (LYBALVI PO), Take by mouth., Disp: , Rfl:  ?  OneTouch Delica Lancets 375QMISC, Use to test blood sugar daily as directed, Disp: 100 each, Rfl: 3 ?  pantoprazole (PROTONIX) 40 MG tablet, TAKE ONE TABLET BY MOUTH TWICE DAILY, Disp: 180 tablet, Rfl: 0 ?  promethazine (PHENERGAN) 12.5 MG tablet, TAKE 1 TO 2 TABLETS EVERY 8 HOURS AS NEEDED FOR NAUSEA AND VOMITING, Disp: 60 tablet, Rfl: 1 ?  psyllium (METAMUCIL) 58.6 % powder, Take 1 packet by mouth 2 (two) times daily., Disp: , Rfl:  ?  rosuvastatin (CRESTOR) 5 MG tablet, TAKE ONE (1) TABLET BY MOUTH EVERY DAY, Disp: 90 tablet, Rfl: 0 ?  tamsulosin (FLOMAX) 0.4 MG CAPS capsule, TAKE ONE CAPSULE BY MOUTH DAILY, Disp: 30 capsule, Rfl: 0 ?  tirzepatide (MOUNJARO) 2.5 MG/0.5ML Pen, Inject 2.5 mg into the skin once a week. DX: E11.65 T2DM, Disp: 2 mL, Rfl: 2 ?  tiZANidine (ZANAFLEX) 4 MG tablet, TAKE 1 TABLET BY MOUTH EVERY 8 HOURS AS NEEDED FOR MUSCLE SPASMS, Disp: 90 tablet, Rfl: 3 ?  triamcinolone cream (KENALOG) 0.1 %, APPLY TWICE DAILY, Disp: 45 g, Rfl: 0 ?  vitamin B-12 (CYANOCOBALAMIN) 250 MCG tablet, Take 1 tablet (250 mcg total) by mouth daily. For low B-12 supplementation, Disp: 1 tablet, Rfl:  ? ?Assessment/ Plan: ?51y.o. male  ? ?Acute bacterial sinusitis - Plan: amoxicillin-clavulanate (AUGMENTIN) 875-125 MG tablet ? ?Empiric antibiotics given prolonged illness with no improvement and refractory nature to OTC meds.  Discussed addition of nasal saline.  We discussed red flag signs and symptoms warranting  further evaluation.  He voiced good understanding and will follow-up as needed ? ?Start time: 1:09pm ?End time: 1:21pm ? ?Total time spent on patient care (including telephone call/ virtual visit): 12 minutes ? ?AJanora Norlander DO ?[facility]?([phone] ? ?

## 2022-05-03 ENCOUNTER — Other Ambulatory Visit: Payer: Self-pay | Admitting: Family Medicine

## 2022-05-03 ENCOUNTER — Ambulatory Visit: Payer: 59 | Admitting: Family Medicine

## 2022-05-16 DIAGNOSIS — L308 Other specified dermatitis: Secondary | ICD-10-CM | POA: Diagnosis not present

## 2022-05-20 ENCOUNTER — Encounter: Payer: Self-pay | Admitting: Family Medicine

## 2022-05-20 ENCOUNTER — Ambulatory Visit (INDEPENDENT_AMBULATORY_CARE_PROVIDER_SITE_OTHER): Payer: 59 | Admitting: Family Medicine

## 2022-05-20 VITALS — BP 113/80 | HR 82 | Temp 97.6°F | Ht 70.0 in | Wt 251.2 lb

## 2022-05-20 DIAGNOSIS — E785 Hyperlipidemia, unspecified: Secondary | ICD-10-CM

## 2022-05-20 DIAGNOSIS — E1165 Type 2 diabetes mellitus with hyperglycemia: Secondary | ICD-10-CM | POA: Diagnosis not present

## 2022-05-20 DIAGNOSIS — E039 Hypothyroidism, unspecified: Secondary | ICD-10-CM

## 2022-05-20 DIAGNOSIS — N400 Enlarged prostate without lower urinary tract symptoms: Secondary | ICD-10-CM

## 2022-05-20 DIAGNOSIS — E1169 Type 2 diabetes mellitus with other specified complication: Secondary | ICD-10-CM

## 2022-05-20 LAB — BAYER DCA HB A1C WAIVED: HB A1C (BAYER DCA - WAIVED): 5.9 % — ABNORMAL HIGH (ref 4.8–5.6)

## 2022-05-20 NOTE — Progress Notes (Signed)
Subjective: CC:DM PCP: Janora Norlander, DO JQZ:ESPQZRA Bolender is a 51 y.o. male presenting to clinic today for:  1. Type 2 Diabetes with hypertension, hyperlipidemia:  Patient is coming today by his wife.  He reports compliance with Steglatro 15 mg daily, metformin 1000 mg twice daily and Tresiba 20 units daily.  Sometimes he will inject up to 30 units and typically splits this up 2 times daily dosing.  He notes that he increases to 30 units whenever his sugar is "bad".  He denies any hypoglycemic episodes.  He continues to see Dr. Buddy Duty on a yearly basis and would like his sugar levels to be submitted to him along with thyroid levels once they are available  Last eye exam: Up-to-date  Last foot exam: Up-to-date Last A1c:  Lab Results  Component Value Date   HGBA1C 7.7 (H) 02/13/2022   Nephropathy screen indicated?:  Needs Last flu, zoster and/or pneumovax:  Immunization History  Administered Date(s) Administered   H1N1 11/03/2008   Influenza,inj,Quad PF,6+ Mos 11/19/2016, 10/20/2017, 09/11/2018, 08/20/2019, 09/27/2020   Influenza,inj,quad, With Preservative 12/16/2017   Influenza-Unspecified 10/10/2021   Moderna Sars-Covid-2 Vaccination 03/04/2020, 04/05/2020, 10/09/2020   PNEUMOCOCCAL CONJUGATE-20 12/24/2021   Tdap 05/10/2020   Zoster Recombinat (Shingrix) 08/01/2021, 10/08/2021    ROS: No chest pain, shortness of breath reported.  2.  Hypothyroidism Patient is compliant with thyroid replacement.  His dosing was adjusted to 150 mcg daily and he is no longer alternating with the 175.  This was a change made recently by his endocrinologist.    ROS: Per HPI  Allergies  Allergen Reactions   Abilify [Aripiprazole] Anaphylaxis and Swelling    "slurred speech" FACIAL DROOPING, TONGUE SWELLING, TARDIVE DYSKENESIA    Caplyta [Lumateperone] Anaphylaxis   Seroquel [Quetiapine Fumarate]     tardive dyskinesia   Alcohol Itching    Drinking alcohol   Ambien [Zolpidem]  Other (See Comments)    NIGHT TERRORS   Anacin-3 [Acetaminophen] Itching    REGULAR TYLENOL IS OKAY.  NEEDS BENADRYL TO TAKE THIS   Cariprazine Other (See Comments)    Tardive dyskenesia   Cariprazine Hcl Itching   Latuda [Lurasidone Hcl] Other (See Comments)    RUNS SUGAR TOO HIGH   Lurasidone Other (See Comments)    TARDIVE DYSKINESIA   Prozac [Fluoxetine] Itching   Victoza [Liraglutide] Other (See Comments)    Severe heartburn    Bupropion Rash   Hydrocodone-Acetaminophen Itching   Risperidone Rash   Tegretol [Carbamazepine] Rash   Past Medical History:  Diagnosis Date   Anxiety    Bipolar disorder ([place])    CTS (carpal tunnel syndrome)    Depression    Diabetes mellitus without complication ([place])    GERD (gastroesophageal reflux disease)    H/O bariatric surgery 2016   Head injury 05/06/2017   Hypothyroidism    Hypothyroidism    Sleep apnea    no longer with OSA d/t over 100 pd weight loss    Current Outpatient Medications:    ALPRAZolam (XANAX) 1 MG tablet, 1 mg. Six times per day prn, Disp: , Rfl:    amoxicillin-clavulanate (AUGMENTIN) 875-125 MG tablet, Take 1 tablet by mouth 2 (two) times daily., Disp: 20 tablet, Rfl: 0   Blood Glucose Monitoring Suppl (ONETOUCH VERIO) w/Device KIT, Use to test blood sugar daily as directed, Disp: 1 kit, Rfl: 0   Calcium-Vitamin D-Vitamin K (SM CALCIUM SOFT CHEWS PO), Take 1 application by mouth 2 (two) times daily. Chew calcium tabs twice a  day.  (bariatric surgery), Disp: , Rfl:    docusate sodium (COLACE) 100 MG capsule, Take 100 mg by mouth 2 (two) times daily., Disp: , Rfl:    ertugliflozin L-PyroglutamicAc (STEGLATRO) 15 MG TABS tablet, Take 1 tablet (15 mg total) by mouth daily before breakfast., Disp: 30 tablet, Rfl: 5   ertugliflozin L-PyroglutamicAc (STEGLATRO) 5 MG TABS tablet, Take 5 mg by mouth daily., Disp: , Rfl:    gabapentin (NEURONTIN) 300 MG capsule, Take 1 capsule (300 mg total) by mouth 3 (three) times daily.,  Disp: 270 capsule, Rfl: 3   glucose blood test strip, test blood sugar 2x daily Dx E11.9, Disp: 100 each, Rfl: 3   insulin degludec (TRESIBA FLEXTOUCH) 100 UNIT/ML FlexTouch Pen, Inject 10 Units into the skin at bedtime., Disp: , Rfl:    lamoTRIgine (LAMICTAL) 200 MG tablet, Take 400 mg by mouth at bedtime. , Disp: , Rfl:    levothyroxine (SYNTHROID) 150 MCG tablet, Take 1 tablet (150 mcg total) by mouth daily. Alternate with 15mg, Disp: 90 tablet, Rfl: 3   levothyroxine (SYNTHROID) 175 MCG tablet, Take 1 tablet (175 mcg total) by mouth daily. Alternate with 150 mcg, Disp: 90 tablet, Rfl: 3   loratadine (CLARITIN) 10 MG tablet, Take 1 tablet (10 mg total) by mouth daily., Disp: 30 tablet, Rfl: 11   metFORMIN (GLUCOPHAGE) 1000 MG tablet, Take 1 tablet (1,000 mg total) by mouth 2 (two) times daily with a meal., Disp: 180 tablet, Rfl: 3   Multiple Vitamin (MULTIVITAMIN WITH MINERALS) TABS tablet, Take 1 tablet by mouth 2 (two) times daily. For Vitamin supplementation (Patient taking differently: Take 1 tablet by mouth 2 (two) times daily. For Vitamin supplementation FROM GASTRIC BYPASS), Disp: , Rfl:    nystatin cream (MYCOSTATIN), APPLY TWICE DAILY, Disp: 30 g, Rfl: 0   OLANZapine-Samidorphan (LYBALVI PO), Take by mouth., Disp: , Rfl:    OneTouch Delica Lancets 335KMISC, Use to test blood sugar daily as directed, Disp: 100 each, Rfl: 3   pantoprazole (PROTONIX) 40 MG tablet, TAKE ONE TABLET BY MOUTH TWICE DAILY, Disp: 180 tablet, Rfl: 0   promethazine (PHENERGAN) 12.5 MG tablet, TAKE 1 TO 2 TABLETS EVERY 8 HOURS AS NEEDED FOR NAUSEA AND VOMITING, Disp: 60 tablet, Rfl: 1   psyllium (METAMUCIL) 58.6 % powder, Take 1 packet by mouth 2 (two) times daily., Disp: , Rfl:    rosuvastatin (CRESTOR) 5 MG tablet, TAKE ONE (1) TABLET BY MOUTH EVERY DAY, Disp: 90 tablet, Rfl: 0   tamsulosin (FLOMAX) 0.4 MG CAPS capsule, TAKE ONE CAPSULE BY MOUTH DAILY, Disp: 30 capsule, Rfl: 0   tiZANidine (ZANAFLEX) 4 MG  tablet, TAKE 1 TABLET BY MOUTH EVERY 8 HOURS AS NEEDED FOR MUSCLE SPASMS, Disp: 90 tablet, Rfl: 3   triamcinolone cream (KENALOG) 0.1 %, APPLY TWICE DAILY, Disp: 45 g, Rfl: 0   vitamin B-12 (CYANOCOBALAMIN) 250 MCG tablet, Take 1 tablet (250 mcg total) by mouth daily. For low B-12 supplementation, Disp: 1 tablet, Rfl:  Social History   Socioeconomic History   Marital status: Married    Spouse name: LMickel Baas  Number of children: 1   Years of education: Not on file   Highest education level: Not on file  Occupational History   Not on file  Tobacco Use   Smoking status: Never   Smokeless tobacco: Never  Vaping Use   Vaping Use: Never used  Substance and Sexual Activity   Alcohol use: No    Comment: severe allergy to alcohol!!  Drug use: No   Sexual activity: Yes  Other Topics Concern   Not on file  Social History Narrative   Patient lives in mothers' home. He stays alone until nighttime when wife comes up.    He stays there to keep an eye on the house.    His son comes to visit also.          Social Determinants of Health   Financial Resource Strain: Not on file  Food Insecurity: Not on file  Transportation Needs: Not on file  Physical Activity: Not on file  Stress: Not on file  Social Connections: Not on file  Intimate Partner Violence: Not on file   Family History  Problem Relation Age of Onset   Diabetes Mother    Heart disease Mother    Depression Mother    Esophageal cancer Mother    COPD Father    Heart disease Father    Kidney disease Father    Mental illness Father    Depression Father    Mental illness Sister    Depression Sister    Esophageal cancer Maternal Uncle    Colon cancer Neg Hx    Rectal cancer Neg Hx    Stomach cancer Neg Hx     Objective: Office vital signs reviewed. BP 113/80   Pulse 82   Temp 97.6 F (36.4 C)   Ht '5\' 10"'  (1.778 m)   Wt 251 lb 3.2 oz (113.9 kg)   SpO2 99%   BMI 36.04 kg/m   Physical Examination:  General:  Awake, alert, nontoxic male, No acute distress HEENT: Sclera white.  Moist mucous membranes.  No exophthalmos Cardio: regular rate and rhythm, S1S2 heard, no murmurs appreciated Pulm: clear to auscultation bilaterally, no wheezes, rhonchi or rales; normal work of breathing on room air Neuro: No tremor.  Assessment/ Plan: 51 y.o. male   Controlled type 2 diabetes mellitus with other specified complication, without long-term current use of insulin ([place]) - Plan: Microalbumin / creatinine urine ratio, Bayer DCA Hb A1c Waived, CMP14+EGFR  Hyperlipidemia associated with type 2 diabetes mellitus ([place]) - Plan: Lipid Panel, CMP14+EGFR  Acquired hypothyroidism - Plan: TSH, T4, Free, CMP14+EGFR  Benign prostatic hyperplasia without lower urinary tract symptoms - Plan: PSA  Sugar now under excellent control with A1c down to 5.9.  Continue current regimen.  Discussed that really his basal insulin can be administered as a single dose so as to reduce the number of needle sticks.  Urine microalbumin ordered.  Check CMP.  Keep appointment with Almyra Free in July to discuss [place].    Check lipid panel.  Thyroid levels collected and we will CC these to Dr. Buddy Duty.  Check PSA along with other fasting labs  No orders of the defined types were placed in this encounter.  No orders of the defined types were placed in this encounter.    Janora Norlander, DO [place] [phone]

## 2022-05-21 LAB — CMP14+EGFR
ALT: 31 IU/L (ref 0–44)
AST: 24 IU/L (ref 0–40)
Albumin/Globulin Ratio: 1.9 (ref 1.2–2.2)
Albumin: 4.1 g/dL (ref 3.8–4.9)
Alkaline Phosphatase: 68 IU/L (ref 44–121)
BUN/Creatinine Ratio: 13 (ref 9–20)
BUN: 12 mg/dL (ref 6–24)
Bilirubin Total: 0.2 mg/dL (ref 0.0–1.2)
CO2: 24 mmol/L (ref 20–29)
Calcium: 8.7 mg/dL (ref 8.7–10.2)
Chloride: 104 mmol/L (ref 96–106)
Creatinine, Ser: 0.9 mg/dL (ref 0.76–1.27)
Globulin, Total: 2.2 g/dL (ref 1.5–4.5)
Glucose: 82 mg/dL (ref 70–99)
Potassium: 4.4 mmol/L (ref 3.5–5.2)
Sodium: 142 mmol/L (ref 134–144)
Total Protein: 6.3 g/dL (ref 6.0–8.5)
eGFR: 103 mL/min/{1.73_m2} (ref >59)

## 2022-05-21 LAB — MICROALBUMIN / CREATININE URINE RATIO
Creatinine, Urine: 123.1 mg/dL
Microalb/Creat Ratio: 3 mg/g creat (ref 0–29)
Microalbumin, Urine: 3.5 ug/mL

## 2022-05-21 LAB — T4, FREE: Free T4: 1.8 ng/dL — ABNORMAL HIGH (ref 0.82–1.77)

## 2022-05-21 LAB — LIPID PANEL
Chol/HDL Ratio: 2.1 ratio (ref 0.0–5.0)
Cholesterol, Total: 76 mg/dL — ABNORMAL LOW (ref 100–199)
HDL: 37 mg/dL — ABNORMAL LOW (ref >39)
LDL Chol Calc (NIH): 24 mg/dL (ref 0–99)
Triglycerides: 62 mg/dL (ref 0–149)
VLDL Cholesterol Cal: 15 mg/dL (ref 5–40)

## 2022-05-21 LAB — TSH: TSH: 0.027 u[IU]/mL — ABNORMAL LOW (ref 0.450–4.500)

## 2022-05-21 LAB — PSA: Prostate Specific Ag, Serum: 0.2 ng/mL (ref 0.0–4.0)

## 2022-05-30 ENCOUNTER — Ambulatory Visit (INDEPENDENT_AMBULATORY_CARE_PROVIDER_SITE_OTHER): Payer: 59 | Admitting: Pharmacist

## 2022-05-30 DIAGNOSIS — E118 Type 2 diabetes mellitus with unspecified complications: Secondary | ICD-10-CM

## 2022-05-30 NOTE — Progress Notes (Unsigned)
      05/30/2022 Name: Marvin Espinoza MRN: [id number]       DOB: 1971/08/23     S:  48 yoM Presents for diabetes evaluation, education, and management Patient was referred and last seen by Primary Care Provider on 02/13/22   Insurance coverage/medication affordability: aetna/cvs high deductible   Patient reports adherence with medications. Current diabetes medications include: metformin Intolerances: Victoza High deductible PLAN--unable to get any GLP1, DPP4 or SGLT2: I.e., mounjaro, Trulicity, rybelsus (tried all)   Patient denies hypoglycemic events.   Patient reported dietary habits: Eats 3 meals/day   Patient-reported exercise habits: encouraged     Patient reports nocturia (nighttime urination).   O:   Recent Labs       Lab Results  Component Value Date    HGBA1C 7.7 (H) 02/13/2022        Lipid Panel   Labs (Brief)          Component Value Date/Time    CHOL 141 04/30/2021 0836    TRIG 75 04/30/2021 0836    HDL 47 04/30/2021 0836    CHOLHDL 3.0 04/30/2021 0836    CHOLHDL 3.8 01/27/2020 0617    VLDL 20 01/27/2020 0617    LDLCALC 79 04/30/2021 0836         Home fasting blood sugars: 200s   2 hour post-meal/random blood sugars: n/a.     Clinical Atherosclerotic Cardiovascular Disease (ASCVD): No    The 10-year ASCVD risk score (Arnett DK, et al., 2019) is: 5.9%   Values used to calculate the score:     Age: 38 years     Sex: Male     Is Non-Hispanic African American: No     Diabetic: Yes     Tobacco smoker: No     Systolic Blood Pressure: 977 mmHg     Is BP treated: No     HDL Cholesterol: 47 mg/dL     Total Cholesterol: 141 mg/dL     A/P:   Diabetes T2DM currently CONTROLLED.  Patient is adherent with medication. Control is suboptimal due to high deductible plan/lifestyle management.   -convert to soliqua 15 units (sample given)  -stop steglatro   -continue metformin   -try to obtain SGLT2 Steglatro using copay card (working on PA)      -Extensively discussed pathophysiology of diabetes, recommended lifestyle interventions, dietary effects on blood sugar control   -Counseled on s/sx of and management of hypoglycemia   -Next A1C anticipated 3 months.   Written patient instructions provided.  Total time in face to face counseling 40 minutes.        Regina Eck, PharmD, BCPS Clinical Pharmacist, [place]  II Phone [phone]

## 2022-06-01 ENCOUNTER — Other Ambulatory Visit: Payer: Self-pay | Admitting: Family Medicine

## 2022-06-01 DIAGNOSIS — R39198 Other difficulties with micturition: Secondary | ICD-10-CM

## 2022-06-07 ENCOUNTER — Telehealth: Payer: Self-pay | Admitting: Pharmacist

## 2022-06-07 ENCOUNTER — Other Ambulatory Visit: Payer: Self-pay | Admitting: Family Medicine

## 2022-06-07 DIAGNOSIS — E118 Type 2 diabetes mellitus with unspecified complications: Secondary | ICD-10-CM

## 2022-06-07 MED ORDER — SOLIQUA 100-33 UNT-MCG/ML ~~LOC~~ SOPN: 15.0000 [IU] | PEN_INJECTOR | Freq: Every morning | SUBCUTANEOUS | 3 refills | Status: DC

## 2022-06-07 MED ORDER — FREESTYLE LIBRE 2 SENSOR MISC: 11 refills | Status: DC

## 2022-06-11 ENCOUNTER — Encounter: Payer: Self-pay | Admitting: Family Medicine

## 2022-06-12 ENCOUNTER — Telehealth: Payer: Self-pay

## 2022-06-12 DIAGNOSIS — E118 Type 2 diabetes mellitus with unspecified complications: Secondary | ICD-10-CM

## 2022-06-12 NOTE — Telephone Encounter (Signed)
Sherard Sivley Key: [id number] - Rx #: [id number] Need help? Call us at [phone] Status Sent to [place] 2 Sensor Form [employer] Electronic PA Form [phone] NCPDP)

## 2022-06-13 NOTE — Telephone Encounter (Signed)
Macklen Cristina Key: [id number] - PA Case ID: [id number] - Rx #: [id number] Need help? Call us at [phone] Status Sent to [place] [facility] [place] 2 Sensor Form [employer] Electronic PA Form [phone] NCPDP) Original Claim Info 70,MR

## 2022-06-17 NOTE — Telephone Encounter (Signed)
Leif Veloso Key: [id number] - PA Case ID: [id number] - Rx #: [id number] Need help? Call us at [phone] Outcome Deniedon June 29 Your PA request has been denied. Additional information will be provided in the denial communication. (Message [phone]) [facility] Financial risk analyst Electronic [place] Form [phone] NCPDP) Original Claim Info 70,MR

## 2022-06-19 MED ORDER — DEXCOM G7 SENSOR MISC: 11 refills | Status: DC

## 2022-06-19 NOTE — Telephone Encounter (Signed)
[facility] NON FORMULARY WILL ATTEMPT TO SEE IF DEXCOM IS COVERED G7

## 2022-06-19 NOTE — Addendum Note (Signed)
Addended by: Lottie Dawson D on: 06/19/2022 03:14 PM   Modules accepted: Orders

## 2022-06-20 ENCOUNTER — Encounter: Payer: Self-pay | Admitting: Pharmacist

## 2022-06-20 NOTE — Telephone Encounter (Signed)
Additional Information Required Your PA has been resolved, no additional PA is required. For further inquiries please contact the number on the back of the member prescription card. (Message [phone])   WILL HAVE PATIENT CALL THE PHARMACY/INSURANCE TO SEE IF COVERED

## 2022-07-08 ENCOUNTER — Other Ambulatory Visit: Payer: Self-pay | Admitting: Family Medicine

## 2022-07-08 DIAGNOSIS — E1165 Type 2 diabetes mellitus with hyperglycemia: Secondary | ICD-10-CM

## 2022-07-09 ENCOUNTER — Encounter: Payer: Self-pay | Admitting: Family Medicine

## 2022-07-09 DIAGNOSIS — R202 Paresthesia of skin: Secondary | ICD-10-CM

## 2022-07-09 DIAGNOSIS — T7840XA Allergy, unspecified, initial encounter: Secondary | ICD-10-CM

## 2022-08-01 ENCOUNTER — Other Ambulatory Visit: Payer: Self-pay | Admitting: Family Medicine

## 2022-08-01 DIAGNOSIS — M5136 Other intervertebral disc degeneration, lumbar region: Secondary | ICD-10-CM

## 2022-08-01 NOTE — Telephone Encounter (Signed)
Patient has appt 08/21/22

## 2022-08-02 NOTE — Telephone Encounter (Signed)
Please see the MyChart message reply(ies) for my assessment and plan.   Orders Placed This Encounter  Procedures   Alpha-Gal Panel    Standing Status:   Future    Standing Expiration Date:   08/03/2023   Ambulatory referral to Allergy    Referral Priority:   Routine    Referral Type:   Allergy Testing    Referral Reason:   Specialty Services Required    Requested Specialty:   Allergy    Number of Visits Requested:   1     This patient gave consent for this Medical Advice Message and is aware that it may result in a bill to their insurance company, as well as the possibility of receiving a bill for a co-payment or deductible. They are an established patient, but are not seeking medical advice exclusively about a problem treated during an in person or video visit in the last seven days. I did not recommend an in person or video visit within seven days of my reply.    I spent a total of 5 minutes cumulative time within 7 days through [employer].  Ronnie Doss, DO

## 2022-08-05 ENCOUNTER — Other Ambulatory Visit: Payer: Self-pay | Admitting: Family Medicine

## 2022-08-05 DIAGNOSIS — R202 Paresthesia of skin: Secondary | ICD-10-CM

## 2022-08-08 ENCOUNTER — Other Ambulatory Visit: Payer: Self-pay | Admitting: Family Medicine

## 2022-08-08 DIAGNOSIS — E119 Type 2 diabetes mellitus without complications: Secondary | ICD-10-CM

## 2022-08-08 DIAGNOSIS — E1165 Type 2 diabetes mellitus with hyperglycemia: Secondary | ICD-10-CM

## 2022-08-09 ENCOUNTER — Encounter: Payer: Self-pay | Admitting: Family Medicine

## 2022-08-12 NOTE — Telephone Encounter (Signed)
Any idea if we will have some for him?

## 2022-08-14 ENCOUNTER — Encounter: Payer: Self-pay | Admitting: Neurology

## 2022-08-14 ENCOUNTER — Encounter: Payer: Self-pay | Admitting: Family Medicine

## 2022-08-14 ENCOUNTER — Ambulatory Visit (INDEPENDENT_AMBULATORY_CARE_PROVIDER_SITE_OTHER): Payer: 59 | Admitting: Family Medicine

## 2022-08-14 VITALS — BP 120/72 | HR 70 | Temp 97.5°F | Resp 20 | Ht 70.0 in | Wt 246.0 lb

## 2022-08-14 DIAGNOSIS — R202 Paresthesia of skin: Secondary | ICD-10-CM | POA: Diagnosis not present

## 2022-08-14 NOTE — Progress Notes (Signed)
Subjective: CC: Pins-and-needles sensation PCP: Janora Norlander, DO BWL:SLHTDSK Marvin Espinoza is a 51 y.o. male presenting to clinic today for:  1.  Pins-and-needles sensation Patient has been having this for the last several weeks.  It onset randomly.  Denies any changes in medications.  This typically affects the proximal body, thighs, upper extremities chest and sometimes scalp.  He denies any involvement in the hands, feet or calf areas.  The symptoms seem to be most prominent at nighttime but do occur during the daytime as well.  The only thing that seems to alleviate the symptoms is going to sleep so he will take a sedative in efforts to do this.  He does not report any weakness associated with the symptoms or visual disturbances.  He has seen neurology in the past but not for this issue.   ROS: Per HPI  Allergies  Allergen Reactions   Abilify [Aripiprazole] Anaphylaxis and Swelling    "slurred speech" FACIAL DROOPING, TONGUE SWELLING, TARDIVE DYSKENESIA    Caplyta [Lumateperone] Anaphylaxis   Seroquel [Quetiapine Fumarate]     tardive dyskinesia   Alcohol Itching    Drinking alcohol   Ambien [Zolpidem] Other (See Comments)    NIGHT TERRORS   Anacin-3 [Acetaminophen] Itching    REGULAR TYLENOL IS OKAY.  NEEDS BENADRYL TO TAKE THIS   Cariprazine Other (See Comments)    Tardive dyskenesia   Cariprazine Hcl Itching   Latuda [Lurasidone Hcl] Other (See Comments)    RUNS SUGAR TOO HIGH   Lurasidone Other (See Comments)    TARDIVE DYSKINESIA   Prozac [Fluoxetine] Itching   Victoza [Liraglutide] Other (See Comments)    Severe heartburn    Bupropion Rash   Hydrocodone-Acetaminophen Itching   Risperidone Rash   Tegretol [Carbamazepine] Rash   Past Medical History:  Diagnosis Date   Anxiety    Bipolar disorder ([place])    CTS (carpal tunnel syndrome)    Depression    Diabetes mellitus without complication ([place])    GERD (gastroesophageal reflux disease)    H/O bariatric  surgery 2016   Head injury 05/06/2017   Hypothyroidism    Hypothyroidism    Sleep apnea    no longer with OSA d/t over 100 pd weight loss    Current Outpatient Medications:    ALPRAZolam (XANAX) 1 MG tablet, 1 mg. Six times per day prn, Disp: , Rfl:    Blood Glucose Monitoring Suppl (ONETOUCH VERIO) w/Device KIT, Use to test blood sugar daily as directed, Disp: 1 kit, Rfl: 0   Calcium-Vitamin D-Vitamin K (SM CALCIUM SOFT CHEWS PO), Take 1 application by mouth 2 (two) times daily. Chew calcium tabs twice a day.  (bariatric surgery), Disp: , Rfl:    docusate sodium (COLACE) 100 MG capsule, Take 100 mg by mouth 2 (two) times daily., Disp: , Rfl:    gabapentin (NEURONTIN) 300 MG capsule, Take 1 capsule (300 mg total) by mouth 3 (three) times daily., Disp: 270 capsule, Rfl: 3   glucose blood test strip, test blood sugar 2x daily Dx E11.9, Disp: 100 each, Rfl: 3   Insulin Glargine-Lixisenatide (SOLIQUA) 100-33 UNT-MCG/ML SOPN, Inject 15-20 Units into the skin in the morning., Disp: 15 mL, Rfl: 3   lamoTRIgine (LAMICTAL) 200 MG tablet, Take 400 mg by mouth at bedtime. , Disp: , Rfl:    levothyroxine (SYNTHROID) 175 MCG tablet, Take 1 tablet (175 mcg total) by mouth daily. Alternate with 150 mcg, Disp: 90 tablet, Rfl: 3   loratadine (CLARITIN) 10 MG  tablet, Take 1 tablet (10 mg total) by mouth daily., Disp: 30 tablet, Rfl: 11   metFORMIN (GLUCOPHAGE) 1000 MG tablet, TAKE 1 TABLET BY MOUTH TWICE DAILY WITH MEALS, Disp: 180 tablet, Rfl: 0   Multiple Vitamin (MULTIVITAMIN WITH MINERALS) TABS tablet, Take 1 tablet by mouth 2 (two) times daily. For Vitamin supplementation (Patient taking differently: Take 1 tablet by mouth 2 (two) times daily. For Vitamin supplementation FROM GASTRIC BYPASS), Disp: , Rfl:    OLANZapine-Samidorphan (LYBALVI PO), Take by mouth., Disp: , Rfl:    OneTouch Delica Lancets 50D MISC, Use to test blood sugar daily as directed, Disp: 100 each, Rfl: 3   pantoprazole (PROTONIX) 40 MG  tablet, TAKE ONE TABLET BY MOUTH TWICE DAILY, Disp: 180 tablet, Rfl: 1   promethazine (PHENERGAN) 12.5 MG tablet, TAKE 1 TO 2 TABLETS EVERY 8 HOURS AS NEEDED FOR NAUSEA AND VOMITING, Disp: 60 tablet, Rfl: 1   psyllium (METAMUCIL) 58.6 % powder, Take 1 packet by mouth 2 (two) times daily., Disp: , Rfl:    rosuvastatin (CRESTOR) 5 MG tablet, TAKE ONE (1) TABLET BY MOUTH EVERY DAY, Disp: 90 tablet, Rfl: 0   STEGLATRO 15 MG TABS tablet, TAKE 1 TABLET BY MOUTH EVERY MORNING BEFORE BREAKFAST, Disp: 90 tablet, Rfl: 0   tacrolimus (PROTOPIC) 0.1 % ointment, Apply topically 2 (two) times daily., Disp: , Rfl:    tiZANidine (ZANAFLEX) 4 MG tablet, TAKE 1 TABLET BY MOUTH EVERY 8 HOURS AS NEEDED FOR MUSCLE SPASMS, Disp: 90 tablet, Rfl: 3   vitamin B-12 (CYANOCOBALAMIN) 250 MCG tablet, Take 1 tablet (250 mcg total) by mouth daily. For low B-12 supplementation, Disp: 1 tablet, Rfl:  Social History   Socioeconomic History   Marital status: Married    Spouse name: Mickel Baas   Number of children: 1   Years of education: Not on file   Highest education level: Not on file  Occupational History   Not on file  Tobacco Use   Smoking status: Never   Smokeless tobacco: Never  Vaping Use   Vaping Use: Never used  Substance and Sexual Activity   Alcohol use: No    Comment: severe allergy to alcohol!!   Drug use: No   Sexual activity: Yes  Other Topics Concern   Not on file  Social History Narrative   Patient lives in mothers' home. He stays alone until nighttime when wife comes up.    He stays there to keep an eye on the house.    His son comes to visit also.          Social Determinants of Health   Financial Resource Strain: Low Risk  (10/04/2018)   Overall Financial Resource Strain (CARDIA)    Difficulty of Paying Living Expenses: Not hard at all  Food Insecurity: No Food Insecurity (10/04/2018)   Hunger Vital Sign    Worried About Running Out of Food in the Last Year: Never true    Ran Out of Food  in the Last Year: Never true  Transportation Needs: No Transportation Needs (10/04/2018)   PRAPARE - Hydrologist (Medical): No    Lack of Transportation (Non-Medical): No  Physical Activity: Not on file  Stress: Not on file  Social Connections: Not on file  Intimate Partner Violence: Not on file   Family History  Problem Relation Age of Onset   Diabetes Mother    Heart disease Mother    Depression Mother    Esophageal cancer Mother  COPD Father    Heart disease Father    Kidney disease Father    Mental illness Father    Depression Father    Mental illness Sister    Depression Sister    Esophageal cancer Maternal Uncle    Colon cancer Neg Hx    Rectal cancer Neg Hx    Stomach cancer Neg Hx     Objective: Office vital signs reviewed. Resp 20   Ht _0  (1.778 m)   Wt 246 lb (111.6 kg)   BMI 35.30 kg/m   Physical Examination:  General: Awake, alert, well nourished, No acute distress HEENT: Sclera white.  PERRLA MSK: Ambulating independently with normal tone, normal gait Neuro: Vibratory sensation intact.  Monofilament sensation to the proximal body intact.  Alert and oriented x3.  Assessment/ Plan: 51 y.o. male   Pins and needles sensation  Uncertain etiology.  I have considered fibromyalgia but the waxing and waning of symptoms is somewhat unusual.  He describes more of a neuropathic type sensation that affects only the proximal body.  No involvement of hands or feet.  Do not think this is related to his diabetes at all.  I am referring to neurology for further assessment and possible nerve conduction studies.  No orders of the defined types were placed in this encounter.  No orders of the defined types were placed in this encounter.    Janora Norlander, DO [place] [phone]

## 2022-08-21 ENCOUNTER — Ambulatory Visit (INDEPENDENT_AMBULATORY_CARE_PROVIDER_SITE_OTHER): Payer: 59 | Admitting: Family Medicine

## 2022-08-21 ENCOUNTER — Encounter: Payer: Self-pay | Admitting: Family Medicine

## 2022-08-21 VITALS — BP 122/77 | HR 67 | Temp 97.3°F | Resp 20 | Ht 70.0 in | Wt 246.0 lb

## 2022-08-21 DIAGNOSIS — R202 Paresthesia of skin: Secondary | ICD-10-CM | POA: Diagnosis not present

## 2022-08-21 DIAGNOSIS — E785 Hyperlipidemia, unspecified: Secondary | ICD-10-CM

## 2022-08-21 DIAGNOSIS — E039 Hypothyroidism, unspecified: Secondary | ICD-10-CM | POA: Diagnosis not present

## 2022-08-21 DIAGNOSIS — E1169 Type 2 diabetes mellitus with other specified complication: Secondary | ICD-10-CM | POA: Diagnosis not present

## 2022-08-21 LAB — CBC WITH DIFFERENTIAL/PLATELET
Basophils Absolute: 0.1 10*3/uL (ref 0.0–0.2)
Basos: 1 %
EOS (ABSOLUTE): 0.2 10*3/uL (ref 0.0–0.4)
Eos: 2 %
Hematocrit: 41.9 % (ref 37.5–51.0)
Hemoglobin: 13.6 g/dL (ref 13.0–17.7)
Immature Grans (Abs): 0 10*3/uL (ref 0.0–0.1)
Immature Granulocytes: 0 %
Lymphocytes Absolute: 3.1 10*3/uL (ref 0.7–3.1)
Lymphs: 42 %
MCH: 28 pg (ref 26.6–33.0)
MCHC: 32.5 g/dL (ref 31.5–35.7)
MCV: 86 fL (ref 79–97)
Monocytes Absolute: 0.5 10*3/uL (ref 0.1–0.9)
Monocytes: 7 %
Neutrophils Absolute: 3.5 10*3/uL (ref 1.4–7.0)
Neutrophils: 48 %
Platelets: 278 10*3/uL (ref 150–450)
RBC: 4.85 x10E6/uL (ref 4.14–5.80)
RDW: 14.6 % (ref 11.6–15.4)
WBC: 7.3 10*3/uL (ref 3.4–10.8)

## 2022-08-21 LAB — BAYER DCA HB A1C WAIVED: HB A1C (BAYER DCA - WAIVED): 6.2 % — ABNORMAL HIGH (ref 4.8–5.6)

## 2022-08-21 NOTE — Progress Notes (Signed)
Subjective: [id number] PCP: Janora Norlander, DO [id number] Perrier is a 51 y.o. male presenting to clinic today for:  1. Hypothyroidism Free T4 and TSH were abnormal back in June.  He was advised to follow-up with Dr. Buddy Duty to reduce to 156mg daily.  2.  Type 2 diabetes with hyperlipidemia Patient reports compliance with Soliqua.  Currently injecting 25 units daily.  He still continues to use Steglatro intermittently in fact admits to taking 2 at 1 time because he had blood sugars in the mid 100s after eating.  He thought that he was supposed to be around 120.  He admits that he ended up waking up with a hypoglycemic episode into the 50s overnight and had to eat in order to get the sugar to come up.  He has not had any recurrence of that.     ROS: Per HPI  Allergies  Allergen Reactions   Abilify [Aripiprazole] Anaphylaxis and Swelling    "slurred speech" FACIAL DROOPING, TONGUE SWELLING, TARDIVE DYSKENESIA    Caplyta [Lumateperone] Anaphylaxis   Seroquel [Quetiapine Fumarate]     tardive dyskinesia   Alcohol Itching    Drinking alcohol   Ambien [Zolpidem] Other (See Comments)    NIGHT TERRORS   Anacin-3 [Acetaminophen] Itching    REGULAR TYLENOL IS OKAY.  NEEDS BENADRYL TO TAKE THIS   Cariprazine Other (See Comments)    Tardive dyskenesia   Cariprazine Hcl Itching   Latuda [Lurasidone Hcl] Other (See Comments)    RUNS SUGAR TOO HIGH   Lurasidone Other (See Comments)    TARDIVE DYSKINESIA   Prozac [Fluoxetine] Itching   Victoza [Liraglutide] Other (See Comments)    Severe heartburn    Bupropion Rash   Hydrocodone-Acetaminophen Itching   Risperidone Rash   Tegretol [Carbamazepine] Rash   Past Medical History:  Diagnosis Date   Anxiety    Bipolar disorder ([place]    CTS (carpal tunnel syndrome)    Depression    Diabetes mellitus without complication ([place]    GERD (gastroesophageal reflux disease)    H/O bariatric surgery 2016   Head injury 05/06/2017    Hypothyroidism    Hypothyroidism    Sleep apnea    no longer with OSA d/t over 100 pd weight loss    Current Outpatient Medications:    ALPRAZolam (XANAX) 1 MG tablet, 1 mg. Six times per day prn, Disp: , Rfl:    Blood Glucose Monitoring Suppl (ONETOUCH VERIO) w/Device KIT, Use to test blood sugar daily as directed, Disp: 1 kit, Rfl: 0   Calcium-Vitamin D-Vitamin K (SM CALCIUM SOFT CHEWS PO), Take 1 application by mouth 2 (two) times daily. Chew calcium tabs twice a day.  (bariatric surgery), Disp: , Rfl:    docusate sodium (COLACE) 100 MG capsule, Take 100 mg by mouth 2 (two) times daily., Disp: , Rfl:    gabapentin (NEURONTIN) 300 MG capsule, Take 1 capsule (300 mg total) by mouth 3 (three) times daily., Disp: 270 capsule, Rfl: 3   glucose blood test strip, test blood sugar 2x daily Dx E11.9, Disp: 100 each, Rfl: 3   Insulin Glargine-Lixisenatide (SOLIQUA) 100-33 UNT-MCG/ML SOPN, Inject 15-20 Units into the skin in the morning., Disp: 15 mL, Rfl: 3   lamoTRIgine (LAMICTAL) 200 MG tablet, Take 400 mg by mouth at bedtime. , Disp: , Rfl:    levothyroxine (SYNTHROID) 175 MCG tablet, Take 1 tablet (175 mcg total) by mouth daily. Alternate with 150 mcg, Disp: 90 tablet, Rfl: 3   loratadine (CLARITIN)  10 MG tablet, Take 1 tablet (10 mg total) by mouth daily., Disp: 30 tablet, Rfl: 11   metFORMIN (GLUCOPHAGE) 1000 MG tablet, TAKE 1 TABLET BY MOUTH TWICE DAILY WITH MEALS, Disp: 180 tablet, Rfl: 0   Multiple Vitamin (MULTIVITAMIN WITH MINERALS) TABS tablet, Take 1 tablet by mouth 2 (two) times daily. For Vitamin supplementation (Patient taking differently: Take 1 tablet by mouth 2 (two) times daily. For Vitamin supplementation FROM GASTRIC BYPASS), Disp: , Rfl:    OLANZapine-Samidorphan (LYBALVI PO), Take by mouth., Disp: , Rfl:    OneTouch Delica Lancets 59R MISC, Use to test blood sugar daily as directed, Disp: 100 each, Rfl: 3   pantoprazole (PROTONIX) 40 MG tablet, TAKE ONE TABLET BY MOUTH TWICE  DAILY, Disp: 180 tablet, Rfl: 1   promethazine (PHENERGAN) 12.5 MG tablet, TAKE 1 TO 2 TABLETS EVERY 8 HOURS AS NEEDED FOR NAUSEA AND VOMITING, Disp: 60 tablet, Rfl: 1   psyllium (METAMUCIL) 58.6 % powder, Take 1 packet by mouth 2 (two) times daily., Disp: , Rfl:    rosuvastatin (CRESTOR) 5 MG tablet, TAKE ONE (1) TABLET BY MOUTH EVERY DAY, Disp: 90 tablet, Rfl: 0   STEGLATRO 15 MG TABS tablet, TAKE 1 TABLET BY MOUTH EVERY MORNING BEFORE BREAKFAST, Disp: 90 tablet, Rfl: 0   tacrolimus (PROTOPIC) 0.1 % ointment, Apply topically 2 (two) times daily., Disp: , Rfl:    tiZANidine (ZANAFLEX) 4 MG tablet, TAKE 1 TABLET BY MOUTH EVERY 8 HOURS AS NEEDED FOR MUSCLE SPASMS, Disp: 90 tablet, Rfl: 3   vitamin B-12 (CYANOCOBALAMIN) 250 MCG tablet, Take 1 tablet (250 mcg total) by mouth daily. For low B-12 supplementation, Disp: 1 tablet, Rfl:  Social History   Socioeconomic History   Marital status: Married    Spouse name: Mickel Baas   Number of children: 1   Years of education: Not on file   Highest education level: Not on file  Occupational History   Not on file  Tobacco Use   Smoking status: Never   Smokeless tobacco: Never  Vaping Use   Vaping Use: Never used  Substance and Sexual Activity   Alcohol use: No    Comment: severe allergy to alcohol!!   Drug use: No   Sexual activity: Yes  Other Topics Concern   Not on file  Social History Narrative   Patient lives in mothers' home. He stays alone until nighttime when wife comes up.    He stays there to keep an eye on the house.    His son comes to visit also.          Social Determinants of Health   Financial Resource Strain: Low Risk  (10/04/2018)   Overall Financial Resource Strain (CARDIA)    Difficulty of Paying Living Expenses: Not hard at all  Food Insecurity: No Food Insecurity (10/04/2018)   Hunger Vital Sign    Worried About Running Out of Food in the Last Year: Never true    Ran Out of Food in the Last Year: Never true   Transportation Needs: No Transportation Needs (10/04/2018)   PRAPARE - Hydrologist (Medical): No    Lack of Transportation (Non-Medical): No  Physical Activity: Not on file  Stress: Not on file  Social Connections: Not on file  Intimate Partner Violence: Not on file   Family History  Problem Relation Age of Onset   Diabetes Mother    Heart disease Mother    Depression Mother    Esophageal cancer  Mother    COPD Father    Heart disease Father    Kidney disease Father    Mental illness Father    Depression Father    Mental illness Sister    Depression Sister    Esophageal cancer Maternal Uncle    Colon cancer Neg Hx    Rectal cancer Neg Hx    Stomach cancer Neg Hx     Objective: Office vital signs reviewed. BP 122/77   Pulse 67   Temp (!) 97.3 F (36.3 C) (Temporal)   Resp 20   Ht '5\' 10"'  (1.778 m)   Wt 246 lb (111.6 kg)   SpO2 97%   BMI 35.30 kg/m   Physical Examination:  General: Awake, alert, well nourished, No acute distress HEENT: No exophthalmos. Cardio: regular rate and rhythm, S1S2 heard, no murmurs appreciated Pulm: clear to auscultation bilaterally, no wheezes, rhonchi or rales; normal work of breathing on room air Neuro: No tremor  Assessment/ Plan: 51 y.o. male   Controlled type 2 diabetes mellitus with other specified complication, without long-term current use of insulin ([place]) - Plan: Bayer DCA Hb A1c Waived, CBC with Differential/Platelet  Hyperlipidemia associated with type 2 diabetes mellitus ([place])  Acquired hypothyroidism - Plan: TSH, T4, Free  Pins and needles sensation - Plan: Iron  Sugar remains under good control with A1c of less than 7.  I discussed with him that I would not recommend ongoing overuse of medications.  Rather I would prefer him to gradually titrate the [facility] if he is having fasting blood sugars greater than 150.  Both he and his wife voiced good understanding.  Continue statin  Check  thyroid levels  Check iron level  Orders Placed This Encounter  Procedures   TSH   T4, Free   Bayer DCA Hb A1c Waived   CBC with Differential/Platelet   No orders of the defined types were placed in this encounter.    Janora Norlander, DO [place] [phone]

## 2022-08-21 NOTE — Progress Notes (Signed)
New Patient Note  RE: Marvin Espinoza MRN: [id number] DOB: 02/24/1971 Date of Office Visit: 08/22/2022  Consult requested by: Janora Norlander, DO Primary care provider: Janora Norlander, DO  Chief Complaint:  burning sensation  History of Present Illness: I had the pleasure of seeing Marvin Espinoza for initial evaluation at the [facility] on 08/22/2022. He is a 51 y.o. male, who is referred here by Janora Norlander, DO for the evaluation of allergy testing. He is accompanied today by his wife who provided/contributed to the history.   Patient noted burning sensation everywhere on his body which started about 3-4 months ago. There are no areas of his body that are worse than others.  Denies any associated rashes. Sometimes noted itching but it's more burning and pins and needles sensation.   Associated symptoms include: none.  Frequency of episodes: about twice a day and lasts until he is able to fall asleep. He tried to take gabapentin, benadryl, xanax, Zanaflex - which only make him tired enough to fall asleep.  This usually happens more so at night but has the sensation right now.   Suspected triggers are unknown. Denies any fevers, chills, changes in medications, foods, personal care products or recent infections.  Previous work up includes: only saw PCP for this. He has appointment with neurology in January. He is on the waiting list to be seen sooner.  Patient is up to date with the following cancer screening tests: physical exam, colonoscopy.  PMHX - diabetes, bipolar, hypothyroidism, reflux, hypercholesterolemia.  Follows with endocrinology and psychiatry.   Assessment and Plan: Marvin Espinoza is a 51 y.o. male with: Burning sensation of skin Whole body burning, pins and needles sensation for 3-4 months. Occurs at least 1-2 times per day. Worse at night making it difficult to fall sleep without taking medications to make him drowsy. Rarely has  associated itching. Denies rash or any other symptoms. Denies changes in diet, meds, personal care products. Medical history significant for diabetes, hypothyroid (recent TSH was low), bipolar, hyperlipidemia, gastric bypass surgery.  Discussed with patient and wife that his current clinical symptoms do not seem to be allergic in nature at all. No indication for any type of allergy skin testing today.  Patient has other medical issues that maybe contributing to his symptoms such as his diabetes, hypothyroidism. Recommend checking B12 level as well due to his past gastric bypass surgery.   See below for proper skin care. Follow up with endocrinology regarding TSH levels. Follow up with neurology.   Return if symptoms worsen or fail to improve.  No orders of the defined types were placed in this encounter.  Lab Orders  No laboratory test(s) ordered today    Other allergy screening: Asthma: no Rhino conjunctivitis: no Food allergy: no Medication allergy: yes Hymenoptera allergy: no Urticaria: no Eczema:no History of recurrent infections suggestive of immunodeficency: no  Diagnostics: None.   Past Medical History: Patient Active Problem List   Diagnosis Date Noted   Burning sensation of skin 08/22/2022   Diabetes mellitus type II, controlled ([place]) 08/01/2021   Chronic constipation 12/29/2020   History of gastric surgery 03/21/2020   Overdose of benzodiazepine, intentional self-harm, initial encounter ([place]) 01/26/2020   MDD (major depressive disorder), recurrent severe, without psychosis ([place]) 01/26/2020   Drug overdose 01/25/2020   Onychomycosis 01/05/2019   Neuropathy 01/05/2019   Severe episode of recurrent major depressive disorder, without psychotic features ([place]) 10/04/2018   OSA (obstructive sleep apnea) 09/07/2018  Bilateral hip pain 10/24/2017   Shoulder pain, bilateral 10/24/2017   Arthritis 10/24/2017   Osteoarthritis of both hips 10/24/2017   Avascular necrosis  of bone of hip, right ([place]) 10/24/2017   Osteoarthritis of shoulder 10/24/2017   PTSD (post-traumatic stress disorder) 08/20/2017   Tardive dyskinesia 08/20/2017   Severe major depression ([place]) 05/10/2017   Affective psychosis, bipolar ([place]) 05/10/2017   Vision disturbance 05/06/2017   History of concussion 02/10/2017   Mood disorder ([place]) 02/10/2017   Adjustment insomnia 01/24/2017   Adjustment disorder with anxious mood 01/24/2017   GAD (generalized anxiety disorder) 01/24/2017   Gastroesophageal reflux disease without esophagitis 11/19/2016   Hypothyroidism 11/19/2016   DDD (degenerative disc disease), lumbar 11/19/2016   Past Medical History:  Diagnosis Date   Anxiety    Bipolar disorder ([place])    CTS (carpal tunnel syndrome)    Depression    Diabetes mellitus without complication ([place])    GERD (gastroesophageal reflux disease)    H/O bariatric surgery 2016   Head injury 05/06/2017   Hypothyroidism    Hypothyroidism    Sleep apnea    no longer with OSA d/t over 100 pd weight loss   Past Surgical History: Past Surgical History:  Procedure Laterality Date   ANAL FISSURE REPAIR     BARIATRIC SURGERY     CARPAL TUNNEL RELEASE Left 05/09/2015   Procedure: LEFT CARPAL TUNNEL RELEASE;  Surgeon: Daryll Brod, MD;  Location: [facility];  Service: Orthopedics;  Laterality: Left;   CARPAL TUNNEL RELEASE Left 05-09-2015   CARPAL TUNNEL RELEASE Right 06/20/2015   Procedure: RIGHT CARPAL TUNNEL RELEASE;  Surgeon: Daryll Brod, MD;  Location: [place];  Service: Orthopedics;  Laterality: Right;  REGIONAL/FAB   CHOLECYSTECTOMY     COLONOSCOPY     UPPER GASTROINTESTINAL ENDOSCOPY     Medication List:  Current Outpatient Medications  Medication Sig Dispense Refill   ALPRAZolam (XANAX) 1 MG tablet 1 mg. Six times per day prn     Blood Glucose Monitoring Suppl (ONETOUCH VERIO) w/Device KIT Use to test blood sugar daily as directed 1 kit 0   Calcium-Vitamin  D-Vitamin K (SM CALCIUM SOFT CHEWS PO) Take 1 application by mouth 2 (two) times daily. Chew calcium tabs twice a day.  (bariatric surgery)     gabapentin (NEURONTIN) 300 MG capsule Take 1 capsule (300 mg total) by mouth 3 (three) times daily. 270 capsule 3   glucose blood test strip test blood sugar 2x daily Dx E11.9 100 each 3   Insulin Glargine-Lixisenatide (SOLIQUA) 100-33 UNT-MCG/ML SOPN Inject 15-20 Units into the skin in the morning. 15 mL 3   lamoTRIgine (LAMICTAL) 200 MG tablet Take 400 mg by mouth at bedtime.      levothyroxine (SYNTHROID) 175 MCG tablet Take 1 tablet (175 mcg total) by mouth daily. Alternate with 150 mcg 90 tablet 3   metFORMIN (GLUCOPHAGE) 1000 MG tablet TAKE 1 TABLET BY MOUTH TWICE DAILY WITH MEALS 180 tablet 0   Multiple Vitamin (MULTIVITAMIN WITH MINERALS) TABS tablet Take 1 tablet by mouth 2 (two) times daily. For Vitamin supplementation (Patient taking differently: Take 1 tablet by mouth 2 (two) times daily. For Vitamin supplementation FROM GASTRIC BYPASS)     OneTouch Delica Lancets 96E MISC Use to test blood sugar daily as directed 100 each 3   pantoprazole (PROTONIX) 40 MG tablet TAKE ONE TABLET BY MOUTH TWICE DAILY 180 tablet 1   rosuvastatin (CRESTOR) 5 MG tablet TAKE ONE (1) TABLET  BY MOUTH EVERY DAY 90 tablet 0   tiZANidine (ZANAFLEX) 4 MG tablet TAKE 1 TABLET BY MOUTH EVERY 8 HOURS AS NEEDED FOR MUSCLE SPASMS 90 tablet 3   vitamin B-12 (CYANOCOBALAMIN) 250 MCG tablet Take 1 tablet (250 mcg total) by mouth daily. For low B-12 supplementation 1 tablet    docusate sodium (COLACE) 100 MG capsule Take 100 mg by mouth 2 (two) times daily. (Patient not taking: Reported on 08/22/2022)     OLANZapine-Samidorphan (LYBALVI PO) Take by mouth. (Patient not taking: Reported on 08/22/2022)     promethazine (PHENERGAN) 12.5 MG tablet TAKE 1 TO 2 TABLETS EVERY 8 HOURS AS NEEDED FOR NAUSEA AND VOMITING (Patient not taking: Reported on 08/22/2022) 60 tablet 1   psyllium (METAMUCIL)  58.6 % powder Take 1 packet by mouth 2 (two) times daily. (Patient not taking: Reported on 08/22/2022)     STEGLATRO 15 MG TABS tablet TAKE 1 TABLET BY MOUTH EVERY MORNING BEFORE BREAKFAST (Patient not taking: Reported on 08/22/2022) 90 tablet 0   tacrolimus (PROTOPIC) 0.1 % ointment Apply topically 2 (two) times daily. (Patient not taking: Reported on 08/22/2022)     No current facility-administered medications for this visit.   Allergies: Allergies  Allergen Reactions   Abilify [Aripiprazole] Anaphylaxis and Swelling    "slurred speech" FACIAL DROOPING, TONGUE SWELLING, TARDIVE DYSKENESIA    Caplyta [Lumateperone] Anaphylaxis   Seroquel [Quetiapine Fumarate]     tardive dyskinesia   Alcohol Itching    Drinking alcohol   Ambien [Zolpidem] Other (See Comments)    NIGHT TERRORS   Anacin-3 [Acetaminophen] Itching    REGULAR TYLENOL IS OKAY.  NEEDS BENADRYL TO TAKE THIS   Cariprazine Other (See Comments)    Tardive dyskenesia   Cariprazine Hcl Itching   Latuda [Lurasidone Hcl] Other (See Comments)    RUNS SUGAR TOO HIGH   Lurasidone Other (See Comments)    TARDIVE DYSKINESIA   Prozac [Fluoxetine] Itching   Victoza [Liraglutide] Other (See Comments)    Severe heartburn    Bupropion Rash   Hydrocodone-Acetaminophen Itching   Risperidone Rash   Tegretol [Carbamazepine] Rash   Social History: Social History   Socioeconomic History   Marital status: Married    Spouse name: Mickel Baas   Number of children: 1   Years of education: Not on file   Highest education level: Not on file  Occupational History   Not on file  Tobacco Use   Smoking status: Never   Smokeless tobacco: Never  Vaping Use   Vaping Use: Never used  Substance and Sexual Activity   Alcohol use: No    Comment: severe allergy to alcohol!!   Drug use: No   Sexual activity: Yes  Other Topics Concern   Not on file  Social History Narrative   Patient lives in mothers' home. He stays alone until nighttime when wife  comes up.    He stays there to keep an eye on the house.    His son comes to visit also.          Social Determinants of Health   Financial Resource Strain: Low Risk  (10/04/2018)   Overall Financial Resource Strain (CARDIA)    Difficulty of Paying Living Expenses: Not hard at all  Food Insecurity: No Food Insecurity (10/04/2018)   Hunger Vital Sign    Worried About Running Out of Food in the Last Year: Never true    Ran Out of Food in the Last Year: Never true  Transportation Needs:  No Transportation Needs (10/04/2018)   PRAPARE - Hydrologist (Medical): No    Lack of Transportation (Non-Medical): No  Physical Activity: Not on file  Stress: Not on file  Social Connections: Not on file   Lives in a 52 year old house. Smoking: denies Occupation: unemployed  Environmental History: Environmental education officer in the house: no Charity fundraiser in the family room: no Carpet in the bedroom: yes Heating: electric Cooling: heat pump Pet: yes 3 cats x 9 yrs  Family History: Family History  Problem Relation Age of Onset   Diabetes Mother    Heart disease Mother    Depression Mother    Esophageal cancer Mother    COPD Father    Heart disease Father    Kidney disease Father    Mental illness Father    Depression Father    Mental illness Sister    Depression Sister    Esophageal cancer Maternal Uncle    Colon cancer Neg Hx    Rectal cancer Neg Hx    Stomach cancer Neg Hx    Problem                               Relation Asthma                                   no Eczema                                no Food allergy                          no Allergic rhino conjunctivitis     no  Review of Systems  Constitutional:  Negative for appetite change, chills, fever and unexpected weight change.  HENT:  Negative for congestion and rhinorrhea.   Eyes:  Negative for itching.  Respiratory:  Negative for cough, chest tightness, shortness of breath and wheezing.    Cardiovascular:  Negative for chest pain.  Gastrointestinal:  Negative for abdominal pain.  Genitourinary:  Negative for difficulty urinating.  Skin:  Negative for rash.       Burning sensation  Neurological:  Negative for headaches.    Objective: BP 100/60   Pulse 64   Temp 97.8 F (36.6 C) (Temporal)   Resp 16   Ht 5' 9.5" (1.765 m)   Wt 244 lb (110.7 kg)   SpO2 98%   BMI 35.52 kg/m  Body mass index is 35.52 kg/m. Physical Exam Vitals and nursing note reviewed.  Constitutional:      Appearance: He is well-developed.  HENT:     Head: Normocephalic and atraumatic.     Right Ear: Tympanic membrane and external ear normal.     Left Ear: Tympanic membrane and external ear normal.     Nose: Nose normal.     Mouth/Throat:     Mouth: Mucous membranes are moist.     Pharynx: Oropharynx is clear.  Eyes:     Conjunctiva/sclera: Conjunctivae normal.  Cardiovascular:     Rate and Rhythm: Normal rate and regular rhythm.     Heart sounds: Normal heart sounds. No murmur heard.    No friction rub. No gallop.  Pulmonary:     Effort: Pulmonary effort is  normal.     Breath sounds: Normal breath sounds. No wheezing, rhonchi or rales.  Musculoskeletal:     Cervical back: Neck supple.  Skin:    General: Skin is warm.     Findings: No rash.  Neurological:     Mental Status: He is alert and oriented to person, place, and time.  Psychiatric:        Behavior: Behavior normal.   The plan was reviewed with the patient/family, and all questions/concerned were addressed.  It was my pleasure to see Treyden today and participate in his care. Please feel free to contact me with any questions or concerns.  Sincerely,  Rexene Alberts, DO Allergy & Immunology  [facility] office: [place] office: [phone]

## 2022-08-22 ENCOUNTER — Ambulatory Visit: Payer: 59 | Admitting: Allergy

## 2022-08-22 ENCOUNTER — Encounter: Payer: Self-pay | Admitting: Allergy

## 2022-08-22 VITALS — BP 100/60 | HR 64 | Temp 97.8°F | Resp 16 | Ht 69.5 in | Wt 244.0 lb

## 2022-08-22 DIAGNOSIS — R208 Other disturbances of skin sensation: Secondary | ICD-10-CM | POA: Diagnosis not present

## 2022-08-22 LAB — TSH: TSH: 0.039 u[IU]/mL — ABNORMAL LOW (ref 0.450–4.500)

## 2022-08-22 LAB — T4, FREE: Free T4: 1.44 ng/dL (ref 0.82–1.77)

## 2022-08-22 NOTE — Patient Instructions (Addendum)
No indication for any allergy testing today as your symptoms do not seem to be allergic in nature.  See below for proper skin care.  Follow up with endocrinology regarding your thyroid levels.  Follow up as needed.  Follow up with neurology as scheduled.   Skin care recommendations  Bath time: Always use lukewarm water. AVOID very hot or cold water. Keep bathing time to 5-10 minutes. Do NOT use bubble bath. Use a mild soap and use just enough to wash the dirty areas. Do NOT scrub skin vigorously.  After bathing, pat dry your skin with a towel. Do NOT rub or scrub the skin.  Moisturizers and prescriptions:  ALWAYS apply moisturizers immediately after bathing (within 3 minutes). This helps to lock-in moisture. Use the moisturizer several times a day over the whole body. Good summer moisturizers include: Aveeno, CeraVe, Cetaphil. Good winter moisturizers include: Aquaphor, Vaseline, Cerave, Cetaphil, Eucerin, Vanicream. When using moisturizers along with medications, the moisturizer should be applied about one hour after applying the medication to prevent diluting effect of the medication or moisturize around where you applied the medications. When not using medications, the moisturizer can be continued twice daily as maintenance.  Laundry and clothing: Avoid laundry products with added color or perfumes. Use unscented hypo-allergenic laundry products such as Tide free, Cheer free & gentle, and All free and clear.  If the skin still seems dry or sensitive, you can try double-rinsing the clothes. Avoid tight or scratchy clothing such as wool. Do not use fabric softeners or dyer sheets.

## 2022-08-22 NOTE — Assessment & Plan Note (Addendum)
Whole body burning, pins and needles sensation for 3-4 months. Occurs at least 1-2 times per day. Worse at night making it difficult to fall sleep without taking medications to make him drowsy. Rarely has associated itching. Denies rash or any other symptoms. Denies changes in diet, meds, personal care products. Medical history significant for diabetes, hypothyroid (recent TSH was low), bipolar, hyperlipidemia, gastric bypass surgery.   Discussed with patient and wife that his current clinical symptoms do not seem to be allergic in nature at all. No indication for any type of allergy skin testing today.   Patient has other medical issues that maybe contributing to his symptoms such as his diabetes, hypothyroidism. Recommend checking B12 level as well due to his past gastric bypass surgery.    See below for proper skin care.  Follow up with endocrinology regarding TSH levels.  Follow up with neurology.

## 2022-08-23 NOTE — Telephone Encounter (Signed)
Not sure if they can see any quicker

## 2022-08-27 DIAGNOSIS — Z77018 Contact with and (suspected) exposure to other hazardous metals: Secondary | ICD-10-CM | POA: Diagnosis not present

## 2022-08-27 DIAGNOSIS — R202 Paresthesia of skin: Secondary | ICD-10-CM | POA: Diagnosis not present

## 2022-08-27 DIAGNOSIS — E039 Hypothyroidism, unspecified: Secondary | ICD-10-CM | POA: Diagnosis not present

## 2022-09-09 ENCOUNTER — Encounter: Payer: Self-pay | Admitting: Family Medicine

## 2022-09-10 NOTE — Telephone Encounter (Signed)
Please inform pt of below

## 2022-09-24 ENCOUNTER — Other Ambulatory Visit: Payer: Self-pay

## 2022-09-24 ENCOUNTER — Other Ambulatory Visit: Payer: 59

## 2022-09-24 ENCOUNTER — Other Ambulatory Visit: Payer: Self-pay | Admitting: Family Medicine

## 2022-09-24 DIAGNOSIS — E039 Hypothyroidism, unspecified: Secondary | ICD-10-CM

## 2022-09-25 LAB — TSH: TSH: 0.03 u[IU]/mL — ABNORMAL LOW (ref 0.450–4.500)

## 2022-09-30 ENCOUNTER — Encounter: Payer: Self-pay | Admitting: Family Medicine

## 2022-10-03 ENCOUNTER — Telehealth: Payer: Self-pay | Admitting: Pharmacist

## 2022-10-03 DIAGNOSIS — E118 Type 2 diabetes mellitus with unspecified complications: Secondary | ICD-10-CM

## 2022-10-03 MED ORDER — SOLIQUA 100-33 UNT-MCG/ML ~~LOC~~ SOPN: 40.0000 [IU] | PEN_INJECTOR | Freq: Every morning | SUBCUTANEOUS | 3 refills | Status: DC

## 2022-10-03 NOTE — Telephone Encounter (Signed)
Have patient call around and find Marvin Espinoza We will send it in when we know where it is available  Maybe try a chain pharmacy?

## 2022-10-03 NOTE — Telephone Encounter (Signed)
Spouse called. The Drug store does not have medication.

## 2022-10-03 NOTE — Telephone Encounter (Signed)
NEW [facility] CALLED IN TO MAXIMIZE PATIENT'S COPAY PATIENT INJECTING 35 UNITS DAILY AND UNDERSTANDS TO  CONTINUE TO INJECT 35-40 UNITS DAILY MAX OF 60 UNITS DAILY WILL ALLOW PATIENT TO GET 1 FULL BOX FOR $99 AT PHARMACY

## 2022-10-04 MED ORDER — SOLIQUA 100-33 UNT-MCG/ML ~~LOC~~ SOPN: 40.0000 [IU] | PEN_INJECTOR | Freq: Every morning | SUBCUTANEOUS | 3 refills | Status: DC

## 2022-10-04 NOTE — Addendum Note (Signed)
Addended by: Everlean Cherry on: 10/04/2022 11:20 AM   Modules accepted: Orders

## 2022-10-04 NOTE — Telephone Encounter (Signed)
Called spouse back. She said that the Drug store did not have the RX, not that the medication was not in stock. Can the rx be re sent?

## 2022-10-04 NOTE — Telephone Encounter (Signed)
The [facility] Store in [place] has the medication but they do not have the prescription and  they are unable to let the patient know the cost until the prescription is sent. He is completely out at this time.

## 2022-10-04 NOTE — Telephone Encounter (Signed)
Pt aware to try and call around to other pharmacies

## 2022-10-09 ENCOUNTER — Ambulatory Visit: Payer: 59

## 2022-10-11 DIAGNOSIS — H2513 Age-related nuclear cataract, bilateral: Secondary | ICD-10-CM | POA: Diagnosis not present

## 2022-10-11 DIAGNOSIS — H40033 Anatomical narrow angle, bilateral: Secondary | ICD-10-CM | POA: Diagnosis not present

## 2022-10-11 DIAGNOSIS — E113293 Type 2 diabetes mellitus with mild nonproliferative diabetic retinopathy without macular edema, bilateral: Secondary | ICD-10-CM | POA: Diagnosis not present

## 2022-10-11 LAB — HM DIABETES EYE EXAM

## 2022-10-11 NOTE — Progress Notes (Unsigned)
NEUROLOGY CONSULTATION NOTE  Mir Fullilove MRN: [id number] DOB: 1971/08/05  Referring provider: Ronnie Doss, DO Primary care provider: Ronnie Doss, DO  Reason for consult:  paresthesias  Assessment/Plan:   Dysesthesias - I don't think this is neurologic. It is intermittent.  He does not exhibit any other symptoms on exam to suggest CNS etiology (brain or spinal cord).  Pattern is not consistent with a peripheral neuropathy.  Therefore, I do not think MRI is warranted.  I do not think NCV-EMG is warranted.  Of note, he may have evidence of a peripheral neuropathy on NCV-EMG as he does have diabetes, but that would not explain his symptoms.     Subjective:  Marvin Espinoza is a 51 year old male with DM II, hypothyroidism and Bipolar disorder who presents for paresthesias.  History supplemented by his accompanying wife and his referring provider's note.  He has had symptoms since at least March.  It occurs in the evenings and when he is not wearing a shirt.  If he sits down or has the blanket over him, he may develop a tingling or burning sensation beginning in his thighs and spreading up to involve his entire torso and down his arms.  He says it feels like fiberglass.  It will also itch as well.  No rash or bug bites.  Denies new medication, soap or detergent.  No associated weakness, visual disturbance, or gait instability.  It does not involve his head, face, hands or feet.  It may last several hours.  On average, it occurs once a week but frequency varies.  He may have it three days in a row and he may go two weeks without an episode.  He saw the allergist who did not suspect allergic reaction.  He has diabetes and hypothyroidism which are controlled.  Hgb A1c is 6.2 and TSH and free T4 0.039 and 1.44 respectively.  He currently takes B12 supplements.  It causes a great deal of distress.  He has taken gabapentin, Benadryl and his psychiatrist prescribed him hydroxyzine for anxiety and  to help him sleep.    Remote MRI of brain with and without contrast performed on 09/20/2018 to evaluate vertigo was personally reviewed and was normal.     PAST MEDICAL HISTORY: Past Medical History:  Diagnosis Date   Anxiety    Bipolar disorder ([place])    CTS (carpal tunnel syndrome)    Depression    Diabetes mellitus without complication ([place])    GERD (gastroesophageal reflux disease)    H/O bariatric surgery 2016   Head injury 05/06/2017   Hypothyroidism    Hypothyroidism    Sleep apnea    no longer with OSA d/t over 100 pd weight loss    PAST SURGICAL HISTORY: Past Surgical History:  Procedure Laterality Date   ANAL FISSURE REPAIR     BARIATRIC SURGERY     CARPAL TUNNEL RELEASE Left 05/09/2015   Procedure: LEFT CARPAL TUNNEL RELEASE;  Surgeon: Daryll Brod, MD;  Location: [facility];  Service: Orthopedics;  Laterality: Left;   CARPAL TUNNEL RELEASE Left 05-09-2015   CARPAL TUNNEL RELEASE Right 06/20/2015   Procedure: RIGHT CARPAL TUNNEL RELEASE;  Surgeon: Daryll Brod, MD;  Location: [facility];  Service: Orthopedics;  Laterality: Right;  REGIONAL/FAB   CHOLECYSTECTOMY     COLONOSCOPY     UPPER GASTROINTESTINAL ENDOSCOPY      MEDICATIONS: Current Outpatient Medications on File Prior to Visit  Medication Sig Dispense Refill  ALPRAZolam (XANAX) 1 MG tablet 1 mg. Six times per day prn     Blood Glucose Monitoring Suppl (ONETOUCH VERIO) w/Device KIT Use to test blood sugar daily as directed 1 kit 0   Calcium-Vitamin D-Vitamin K (SM CALCIUM SOFT CHEWS PO) Take 1 application by mouth 2 (two) times daily. Chew calcium tabs twice a day.  (bariatric surgery)     docusate sodium (COLACE) 100 MG capsule Take 100 mg by mouth 2 (two) times daily. (Patient not taking: Reported on 08/22/2022)     gabapentin (NEURONTIN) 300 MG capsule Take 1 capsule (300 mg total) by mouth 3 (three) times daily. 270 capsule 3   glucose blood test strip test blood sugar 2x daily Dx  E11.9 100 each 3   Insulin Glargine-Lixisenatide (SOLIQUA) 100-33 UNT-MCG/ML SOPN Inject 40-60 Units into the skin in the morning. 15 mL 3   lamoTRIgine (LAMICTAL) 200 MG tablet Take 400 mg by mouth at bedtime.      levothyroxine (SYNTHROID) 175 MCG tablet Take 1 tablet (175 mcg total) by mouth daily. Alternate with 150 mcg 90 tablet 3   metFORMIN (GLUCOPHAGE) 1000 MG tablet TAKE 1 TABLET BY MOUTH TWICE DAILY WITH MEALS 180 tablet 0   Multiple Vitamin (MULTIVITAMIN WITH MINERALS) TABS tablet Take 1 tablet by mouth 2 (two) times daily. For Vitamin supplementation (Patient taking differently: Take 1 tablet by mouth 2 (two) times daily. For Vitamin supplementation FROM GASTRIC BYPASS)     OLANZapine-Samidorphan (LYBALVI PO) Take by mouth. (Patient not taking: Reported on 08/22/2022)     OneTouch Delica Lancets 18E MISC Use to test blood sugar daily as directed 100 each 3   pantoprazole (PROTONIX) 40 MG tablet TAKE ONE TABLET BY MOUTH TWICE DAILY 180 tablet 1   promethazine (PHENERGAN) 12.5 MG tablet TAKE 1 TO 2 TABLETS EVERY 8 HOURS AS NEEDED FOR NAUSEA AND VOMITING (Patient not taking: Reported on 08/22/2022) 60 tablet 1   psyllium (METAMUCIL) 58.6 % powder Take 1 packet by mouth 2 (two) times daily. (Patient not taking: Reported on 08/22/2022)     rosuvastatin (CRESTOR) 5 MG tablet TAKE ONE (1) TABLET BY MOUTH EVERY DAY 90 tablet 0   STEGLATRO 15 MG TABS tablet TAKE 1 TABLET BY MOUTH EVERY MORNING BEFORE BREAKFAST (Patient not taking: Reported on 08/22/2022) 90 tablet 0   tacrolimus (PROTOPIC) 0.1 % ointment Apply topically 2 (two) times daily. (Patient not taking: Reported on 08/22/2022)     tiZANidine (ZANAFLEX) 4 MG tablet TAKE 1 TABLET BY MOUTH EVERY 8 HOURS AS NEEDED FOR MUSCLE SPASMS 90 tablet 3   vitamin B-12 (CYANOCOBALAMIN) 250 MCG tablet Take 1 tablet (250 mcg total) by mouth daily. For low B-12 supplementation 1 tablet    No current facility-administered medications on file prior to visit.     ALLERGIES: Allergies  Allergen Reactions   Abilify [Aripiprazole] Anaphylaxis and Swelling    "slurred speech" FACIAL DROOPING, TONGUE SWELLING, TARDIVE DYSKENESIA    Caplyta [Lumateperone] Anaphylaxis   Seroquel [Quetiapine Fumarate]     tardive dyskinesia   Alcohol Itching    Drinking alcohol   Ambien [Zolpidem] Other (See Comments)    NIGHT TERRORS   Anacin-3 [Acetaminophen] Itching    REGULAR TYLENOL IS OKAY.  NEEDS BENADRYL TO TAKE THIS   Cariprazine Other (See Comments)    Tardive dyskenesia   Cariprazine Hcl Itching   Latuda [Lurasidone Hcl] Other (See Comments)    RUNS SUGAR TOO HIGH   Lurasidone Other (See Comments)    TARDIVE  DYSKINESIA   Prozac [Fluoxetine] Itching   Victoza [Liraglutide] Other (See Comments)    Severe heartburn    Bupropion Rash   Hydrocodone-Acetaminophen Itching   Risperidone Rash   Tegretol [Carbamazepine] Rash    FAMILY HISTORY: Family History  Problem Relation Age of Onset   Diabetes Mother    Heart disease Mother    Depression Mother    Esophageal cancer Mother    COPD Father    Heart disease Father    Kidney disease Father    Mental illness Father    Depression Father    Mental illness Sister    Depression Sister    Esophageal cancer Maternal Uncle    Colon cancer Neg Hx    Rectal cancer Neg Hx    Stomach cancer Neg Hx     Objective:  Blood pressure (!) 154/94, pulse 74, height _0  (1.778 m), weight 234 lb 6.4 oz (106.3 kg), SpO2 96 %. General: No acute distress.  Patient appears well-groomed.   Head:  Normocephalic/atraumatic Eyes:  fundi examined but not visualized Neck: supple, no paraspinal tenderness, full range of motion Back: No paraspinal tenderness Heart: regular rate and rhythm Lungs: Clear to auscultation bilaterally. Vascular: No carotid bruits. Neurological Exam: Mental status: alert and oriented to person, place, and time, speech fluent and not dysarthric, language intact. Cranial nerves: CN  I: not tested CN II: pupils equal, round and reactive to light, visual fields intact CN III, IV, VI:  full range of motion, no nystagmus, no ptosis CN V: facial sensation intact. CN VII: upper and lower face symmetric CN VIII: hearing intact CN IX, X: gag intact, uvula midline CN XI: sternocleidomastoid and trapezius muscles intact CN XII: tongue midline Bulk & Tone: normal, no fasciculations. Motor:  muscle strength 5/5 throughout Sensation:  Pinprick, temperature and vibratory sensation intact. Deep Tendon Reflexes:  2+ throughout,  toes downgoing.   Finger to nose testing:  Without dysmetria.   Heel to shin:  Without dysmetria.   Gait:  Normal station and stride.  Romberg negative.    Thank you for allowing me to take part in the care of this patient.  Metta Clines, DO  CC: Ronnie Doss, DO

## 2022-10-15 ENCOUNTER — Encounter: Payer: Self-pay | Admitting: Neurology

## 2022-10-15 ENCOUNTER — Ambulatory Visit: Payer: 59 | Admitting: Neurology

## 2022-10-15 VITALS — BP 154/94 | HR 74 | Ht 70.0 in | Wt 234.4 lb

## 2022-10-15 DIAGNOSIS — R208 Other disturbances of skin sensation: Secondary | ICD-10-CM

## 2022-10-15 NOTE — Patient Instructions (Signed)
I don't think this is neurologic.  Recommend following up with PCP

## 2022-10-28 ENCOUNTER — Ambulatory Visit: Payer: Self-pay | Admitting: Neurology

## 2022-11-01 ENCOUNTER — Other Ambulatory Visit: Payer: Self-pay | Admitting: Family Medicine

## 2022-11-01 DIAGNOSIS — M5136 Other intervertebral disc degeneration, lumbar region: Secondary | ICD-10-CM

## 2022-11-01 DIAGNOSIS — E1165 Type 2 diabetes mellitus with hyperglycemia: Secondary | ICD-10-CM

## 2022-11-26 DIAGNOSIS — E039 Hypothyroidism, unspecified: Secondary | ICD-10-CM | POA: Diagnosis not present

## 2022-11-30 ENCOUNTER — Other Ambulatory Visit: Payer: Self-pay | Admitting: Family Medicine

## 2022-11-30 DIAGNOSIS — M5136 Other intervertebral disc degeneration, lumbar region: Secondary | ICD-10-CM

## 2022-12-03 ENCOUNTER — Ambulatory Visit: Payer: 59 | Admitting: Family Medicine

## 2022-12-06 ENCOUNTER — Other Ambulatory Visit: Payer: Self-pay | Admitting: Family Medicine

## 2022-12-12 ENCOUNTER — Encounter: Payer: Self-pay | Admitting: Family Medicine

## 2022-12-23 ENCOUNTER — Ambulatory Visit (INDEPENDENT_AMBULATORY_CARE_PROVIDER_SITE_OTHER): Payer: 59 | Admitting: Family Medicine

## 2022-12-23 ENCOUNTER — Encounter: Payer: Self-pay | Admitting: Family Medicine

## 2022-12-23 VITALS — BP 131/60 | HR 69 | Temp 97.3°F | Ht 70.0 in | Wt 240.6 lb

## 2022-12-23 DIAGNOSIS — E039 Hypothyroidism, unspecified: Secondary | ICD-10-CM

## 2022-12-23 DIAGNOSIS — M51369 Other intervertebral disc degeneration, lumbar region without mention of lumbar back pain or lower extremity pain: Secondary | ICD-10-CM

## 2022-12-23 DIAGNOSIS — E1169 Type 2 diabetes mellitus with other specified complication: Secondary | ICD-10-CM | POA: Diagnosis not present

## 2022-12-23 DIAGNOSIS — M5136 Other intervertebral disc degeneration, lumbar region: Secondary | ICD-10-CM

## 2022-12-23 DIAGNOSIS — E785 Hyperlipidemia, unspecified: Secondary | ICD-10-CM | POA: Diagnosis not present

## 2022-12-23 LAB — BAYER DCA HB A1C WAIVED: HB A1C (BAYER DCA - WAIVED): 6.4 % — ABNORMAL HIGH (ref 4.8–5.6)

## 2022-12-23 MED ORDER — PANTOPRAZOLE SODIUM 40 MG PO TBEC: 40.0000 mg | DELAYED_RELEASE_TABLET | Freq: Two times a day (BID) | ORAL | 3 refills | Status: DC

## 2022-12-23 MED ORDER — SOLIQUA 100-33 UNT-MCG/ML ~~LOC~~ SOPN: 40.0000 [IU] | PEN_INJECTOR | Freq: Every morning | SUBCUTANEOUS | 3 refills | Status: DC

## 2022-12-23 MED ORDER — ROSUVASTATIN CALCIUM 5 MG PO TABS: ORAL_TABLET | ORAL | 3 refills | Status: DC

## 2022-12-23 MED ORDER — METFORMIN HCL 1000 MG PO TABS: 1000.0000 mg | ORAL_TABLET | Freq: Two times a day (BID) | ORAL | 3 refills | Status: DC

## 2022-12-23 MED ORDER — GABAPENTIN 300 MG PO CAPS: ORAL_CAPSULE | ORAL | 3 refills | Status: DC

## 2022-12-23 NOTE — Progress Notes (Signed)
Subjective: CC:Dm PCP: Marvin Norlander, DO [id number] [name] is a 52 y.o. male presenting to clinic today for:  1. Type 2 Diabetes with hyperlipidemia:  He is accompanied today's visit by his wife.  He reports compliance with both metformin and Soliqua.  The Willeen Niece is working well for him but he does note he has had a couple of low blood sugars into the 50s.  On average he does not see anything higher than 110 however.  Last eye exam: needs Last foot exam: needs Last A1c:  Lab Results  Component Value Date   HGBA1C 6.2 (H) 08/21/2022   Nephropathy screen indicated?: UTD Last flu, zoster and/or pneumovax:  Immunization History  Administered Date(s) Administered   H1N1 11/03/2008   Influenza,inj,Quad PF,6+ Mos 11/19/2016, 10/20/2017, 09/11/2018, 08/20/2019, 09/27/2020   Influenza,inj,quad, With Preservative 12/16/2017   Influenza-Unspecified 10/10/2021   Moderna Sars-Covid-2 Vaccination 03/04/2020, 04/05/2020, 10/09/2020   PNEUMOCOCCAL CONJUGATE-20 12/24/2021   Tdap 05/10/2020   Zoster Recombinat (Shingrix) 08/01/2021, 10/08/2021    ROS: Denies dizziness, LOC, polyuria, polydipsia, unintended weight loss/gain, foot ulcerations, numbness or tingling in extremities, shortness of breath or chest pain.  2. Hypothyroidism Synthroid reduced to 117mg last visit.  He has seen his endocrinologist since that visit and it was further reduced to 125 mcg.  He has had follow-up labs since then and they were within normal range.  He reports energy is stable.  No heart palpitations or changes in bowel habits reported   ROS: Per HPI  Allergies  Allergen Reactions   Abilify [Aripiprazole] Anaphylaxis and Swelling    "slurred speech" FACIAL DROOPING, TONGUE SWELLING, TARDIVE DYSKENESIA    Caplyta [Lumateperone] Anaphylaxis   Seroquel [Quetiapine Fumarate]     tardive dyskinesia   Alcohol Itching    Drinking alcohol   Ambien [Zolpidem] Other (See Comments)    NIGHT TERRORS    Anacin-3 [Acetaminophen] Itching    REGULAR TYLENOL IS OKAY.  NEEDS BENADRYL TO TAKE THIS   Cariprazine Other (See Comments)    Tardive dyskenesia   Cariprazine Hcl Itching   Latuda [Lurasidone Hcl] Other (See Comments)    RUNS SUGAR TOO HIGH   Lurasidone Other (See Comments)    TARDIVE DYSKINESIA   Prozac [Fluoxetine] Itching   Victoza [Liraglutide] Other (See Comments)    Severe heartburn    Bupropion Rash   Hydrocodone-Acetaminophen Itching   Risperidone Rash   Tegretol [Carbamazepine] Rash   Past Medical History:  Diagnosis Date   Anxiety    Bipolar disorder ([place]    CTS (carpal tunnel syndrome)    Depression    Diabetes mellitus without complication ([place]    GERD (gastroesophageal reflux disease)    H/O bariatric surgery 2016   Head injury 05/06/2017   Hypothyroidism    Hypothyroidism    Sleep apnea    no longer with OSA d/t over 100 pd weight loss    Current Outpatient Medications:    ALPRAZolam (XANAX) 1 MG tablet, 1 mg. Six times per day prn, Disp: , Rfl:    Blood Glucose Monitoring Suppl (ONETOUCH VERIO) w/Device KIT, Use to test blood sugar daily as directed, Disp: 1 kit, Rfl: 0   Calcium-Vitamin D-Vitamin K (SM CALCIUM SOFT CHEWS PO), Take 1 application by mouth 2 (two) times daily. Chew calcium tabs twice a day.  (bariatric surgery), Disp: , Rfl:    docusate sodium (COLACE) 100 MG capsule, Take 100 mg by mouth 2 (two) times daily., Disp: , Rfl:    gabapentin (NEURONTIN) 300  MG capsule, TAKE 1 CAPSULE BY MOUTH THREE TIMES A DAY, Disp: 270 capsule, Rfl: 0   glucose blood test strip, test blood sugar 2x daily Dx E11.9, Disp: 100 each, Rfl: 3   hydrOXYzine (VISTARIL) 25 MG capsule, Take 25 mg by mouth 3 (three) times daily., Disp: , Rfl:    Insulin Glargine-Lixisenatide (SOLIQUA) 100-33 UNT-MCG/ML SOPN, Inject 40-60 Units into the skin in the morning., Disp: 15 mL, Rfl: 3   lamoTRIgine (LAMICTAL) 200 MG tablet, Take 400 mg by mouth at bedtime. , Disp: , Rfl:     levothyroxine (SYNTHROID) 137 MCG tablet, Take 137 mcg by mouth daily., Disp: , Rfl:    metFORMIN (GLUCOPHAGE) 1000 MG tablet, TAKE 1 TABLET BY MOUTH TWICE DAILY WITH MEALS, Disp: 180 tablet, Rfl: 0   Multiple Vitamin (MULTIVITAMIN WITH MINERALS) TABS tablet, Take 1 tablet by mouth 2 (two) times daily. For Vitamin supplementation (Patient taking differently: Take 1 tablet by mouth 2 (two) times daily. For Vitamin supplementation FROM GASTRIC BYPASS), Disp: , Rfl:    OneTouch Delica Lancets 54T MISC, Use to test blood sugar daily as directed, Disp: 100 each, Rfl: 3   pantoprazole (PROTONIX) 40 MG tablet, TAKE ONE TABLET BY MOUTH TWICE DAILY, Disp: 180 tablet, Rfl: 0   promethazine (PHENERGAN) 12.5 MG tablet, TAKE 1 TO 2 TABLETS EVERY 8 HOURS AS NEEDED FOR NAUSEA AND VOMITING, Disp: 60 tablet, Rfl: 1   psyllium (METAMUCIL) 58.6 % powder, Take 1 packet by mouth 2 (two) times daily., Disp: , Rfl:    rosuvastatin (CRESTOR) 5 MG tablet, TAKE ONE (1) TABLET BY MOUTH EVERY DAY, Disp: 90 tablet, Rfl: 0   STEGLATRO 15 MG TABS tablet, TAKE 1 TABLET BY MOUTH EVERY MORNING BEFORE BREAKFAST, Disp: 90 tablet, Rfl: 0   tacrolimus (PROTOPIC) 0.1 % ointment, Apply topically 2 (two) times daily., Disp: , Rfl:    tiZANidine (ZANAFLEX) 4 MG tablet, TAKE 1 TABLET BY MOUTH EVERY 8 HOURS AS NEEDED FOR MUSCLE SPASMS, Disp: 90 tablet, Rfl: 0   vitamin B-12 (CYANOCOBALAMIN) 250 MCG tablet, Take 1 tablet (250 mcg total) by mouth daily. 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   Drug use: No   Sexual  activity: Yes  Other Topics Concern   Not on file  Social History Narrative   Lives with Wife, and one child   Two story   He stays there to keep an eye on the house.    His son comes to visit also.   Right handed.   In between jobs as a Curator. 3          Social Determinants of Health   Financial Resource Strain: Low Risk  (10/04/2018)   Overall Financial Resource Strain (CARDIA)    Difficulty of Paying Living Expenses: Not hard at all  Food Insecurity: No Food Insecurity (10/04/2018)   Hunger Vital Sign    Worried About Running Out of Food in the Last Year: Never true    Ran  Out of Food in the Last Year: Never true  Transportation Needs: No Transportation Needs (10/04/2018)   PRAPARE - Hydrologist (Medical): No    Lack of Transportation (Non-Medical): No  Physical Activity: Not on file  Stress: Not on file  Social Connections: Not on file  Intimate Partner Violence: Not on file   Family History  Problem Relation Age of Onset   Diabetes Mother    Heart disease Mother    Depression Mother    Esophageal cancer Mother    COPD Father    Heart disease Father    Kidney disease Father    Mental illness Father    Depression Father    Mental illness Sister    Depression Sister    Esophageal cancer Maternal Uncle    Colon cancer Neg Hx    Rectal cancer Neg Hx    Stomach cancer Neg Hx     Objective: Office vital signs reviewed. BP 131/60   Pulse 69   Temp (!) 97.3 F (36.3 C)   Ht '5\' 10"'$  (1.778 m)   Wt 240 lb 9.6 oz (109.1 kg)   SpO2 96%   BMI 34.52 kg/m   Physical Examination:  General: Awake, alert, well nourished, No acute distress HEENT: sclera white, MMM Cardio: regular rate and rhythm, S1S2 heard, no murmurs appreciated Pulm: clear to auscultation bilaterally, no wheezes, rhonchi or rales; normal work of breathing on room air  Diabetic Foot Exam - Simple   Simple Foot Form Diabetic Foot exam was performed with the  following findings: Yes 12/23/2022  8:19 AM  Visual Inspection See comments: Yes Sensation Testing Intact to touch and monofilament testing bilaterally: Yes Pulse Check Posterior Tibialis and Dorsalis pulse intact bilaterally: Yes Comments Onychomycotic changes to bilateral great toenails with some lifting of the nail from the nailbed.      Assessment/ Plan: 52 y.o. male   Controlled type 2 diabetes mellitus with other specified complication, without long-term current use of insulin ([place]) - Plan: Bayer DCA Hb A1c Waived, Insulin Glargine-Lixisenatide (SOLIQUA) 100-33 UNT-MCG/ML SOPN, metFORMIN (GLUCOPHAGE) 1000 MG tablet  Hyperlipidemia associated with type 2 diabetes mellitus ([place]) - Plan: rosuvastatin (CRESTOR) 5 MG tablet  Acquired hypothyroidism - Plan: CANCELED: TSH, CANCELED: T4, Free  DDD (degenerative disc disease), lumbar - Plan: gabapentin (NEURONTIN) 300 MG capsule  May need to consider backing down the cycle given intermittent hypoglycemic episodes.  Check A1c.  Diabetic foot exam performed.  Release of information for diabetic eye exam completed for [facility]  Not due for fasting lipid.  Statin renewed  Will get records from endocrinologist regarding thyroid.  Gabapentin renewed.  No orders of the defined types were placed in this encounter.  No orders of the defined types were placed in this encounter.    Marvin Norlander, DO [place] [phone]

## 2022-12-30 ENCOUNTER — Other Ambulatory Visit: Payer: Self-pay | Admitting: Family Medicine

## 2022-12-30 DIAGNOSIS — M5136 Other intervertebral disc degeneration, lumbar region: Secondary | ICD-10-CM

## 2022-12-30 DIAGNOSIS — M51369 Other intervertebral disc degeneration, lumbar region without mention of lumbar back pain or lower extremity pain: Secondary | ICD-10-CM

## 2022-12-30 NOTE — Telephone Encounter (Signed)
Last office visit 12/23/22 Last refill 12/02/22, #90, no refills

## 2023-01-13 ENCOUNTER — Ambulatory Visit: Payer: Self-pay | Admitting: Neurology

## 2023-02-24 DIAGNOSIS — M1612 Unilateral primary osteoarthritis, left hip: Secondary | ICD-10-CM | POA: Diagnosis not present

## 2023-02-27 DIAGNOSIS — E039 Hypothyroidism, unspecified: Secondary | ICD-10-CM | POA: Diagnosis not present

## 2023-02-27 DIAGNOSIS — E669 Obesity, unspecified: Secondary | ICD-10-CM | POA: Diagnosis not present

## 2023-02-27 DIAGNOSIS — Z9884 Bariatric surgery status: Secondary | ICD-10-CM | POA: Diagnosis not present

## 2023-03-01 DIAGNOSIS — M25552 Pain in left hip: Secondary | ICD-10-CM | POA: Diagnosis not present

## 2023-03-10 ENCOUNTER — Encounter: Payer: Self-pay | Admitting: Family Medicine

## 2023-03-10 ENCOUNTER — Telehealth (INDEPENDENT_AMBULATORY_CARE_PROVIDER_SITE_OTHER): Payer: 59 | Admitting: Family Medicine

## 2023-03-10 DIAGNOSIS — K2100 Gastro-esophageal reflux disease with esophagitis, without bleeding: Secondary | ICD-10-CM

## 2023-03-10 MED ORDER — FAMOTIDINE 20 MG PO TABS: 20.0000 mg | ORAL_TABLET | Freq: Two times a day (BID) | ORAL | 0 refills | Status: DC

## 2023-03-10 NOTE — Progress Notes (Signed)
MyChart Video visit  Subjective: CC: reflux PCP: Janora Norlander, DO [id number] Salon is a 52 y.o. male. Patient provides verbal consent for consult held via video.  Due to COVID-19 pandemic this visit was conducted virtually. This visit type was conducted due to national recommendations for restrictions regarding the COVID-19 Pandemic (e.g. social distancing, sheltering in place) in an effort to limit this patient's exposure and mitigate transmission in our community. All issues noted in this document were discussed and addressed.  A physical exam was not performed with this format.   Location of patient: home Location of provider: [facility] Others present for call: none  1. GERD Patient reports breakthrough heartburn with sore throat every night at bedtime.  Taking Protonix 40mg  twice daily. Has used Omeprazole, Nexium, Prevacid in the past.  He was switched off of these due to cost.  NO change in diet.  No change in meds.  Uses Soliqua.   ROS: Per HPI  Allergies  Allergen Reactions   Abilify [Aripiprazole] Anaphylaxis and Swelling    "slurred speech" FACIAL DROOPING, TONGUE SWELLING, TARDIVE DYSKENESIA    Caplyta [Lumateperone] Anaphylaxis   Seroquel [Quetiapine Fumarate]     tardive dyskinesia   Alcohol Itching    Drinking alcohol   Ambien [Zolpidem] Other (See Comments)    NIGHT TERRORS   Anacin-3 [Acetaminophen] Itching    REGULAR TYLENOL IS OKAY.  NEEDS BENADRYL TO TAKE THIS   Cariprazine Other (See Comments)    Tardive dyskenesia   Cariprazine Hcl Itching   Latuda [Lurasidone Hcl] Other (See Comments)    RUNS SUGAR TOO HIGH   Lurasidone Other (See Comments)    TARDIVE DYSKINESIA   Prozac [Fluoxetine] Itching   Victoza [Liraglutide] Other (See Comments)    Severe heartburn    Bupropion Rash   Hydrocodone-Acetaminophen Itching   Risperidone Rash   Tegretol [Carbamazepine] Rash   Past Medical History:  Diagnosis Date   Anxiety    Bipolar disorder ([place])     CTS (carpal tunnel syndrome)    Depression    Diabetes mellitus without complication ([place])    GERD (gastroesophageal reflux disease)    H/O bariatric surgery 2016   Head injury 05/06/2017   Hypothyroidism    Hypothyroidism    Sleep apnea    no longer with OSA d/t over 100 pd weight loss    Current Outpatient Medications:    ALPRAZolam (XANAX) 1 MG tablet, 1 mg. Six times per day prn, Disp: , Rfl:    Blood Glucose Monitoring Suppl (ONETOUCH VERIO) w/Device KIT, Use to test blood sugar daily as directed, Disp: 1 kit, Rfl: 0   Calcium-Vitamin D-Vitamin K (SM CALCIUM SOFT CHEWS PO), Take 1 application by mouth 2 (two) times daily. Chew calcium tabs twice a day.  (bariatric surgery), Disp: , Rfl:    docusate sodium (COLACE) 100 MG capsule, Take 100 mg by mouth 2 (two) times daily., Disp: , Rfl:    gabapentin (NEURONTIN) 300 MG capsule, TAKE 1 CAPSULE BY MOUTH THREE TIMES A DAY, Disp: 270 capsule, Rfl: 3   glucose blood test strip, test blood sugar 2x daily Dx E11.9, Disp: 100 each, Rfl: 3   hydrOXYzine (VISTARIL) 25 MG capsule, Take 25 mg by mouth 3 (three) times daily., Disp: , Rfl:    Insulin Glargine-Lixisenatide (SOLIQUA) 100-33 UNT-MCG/ML SOPN, Inject 40-60 Units into the skin in the morning., Disp: 15 mL, Rfl: 3   lamoTRIgine (LAMICTAL) 200 MG tablet, Take 400 mg by mouth at bedtime. , Disp: ,  Rfl:    levothyroxine (SYNTHROID) 125 MCG tablet, Take 125 mcg by mouth daily before breakfast., Disp: , Rfl:    Magnesium Gluconate (MAGNESIUM 27 PO), Take by mouth., Disp: , Rfl:    metFORMIN (GLUCOPHAGE) 1000 MG tablet, Take 1 tablet (1,000 mg total) by mouth 2 (two) times daily with a meal., Disp: 180 tablet, Rfl: 3   Multiple Vitamin (MULTIVITAMIN WITH MINERALS) TABS tablet, Take 1 tablet by mouth 2 (two) times daily. For Vitamin supplementation (Patient taking differently: Take 1 tablet by mouth 2 (two) times daily. For Vitamin supplementation FROM GASTRIC BYPASS), Disp: , Rfl:    OneTouch  Delica Lancets [id number] MISC, Use to test blood sugar daily as directed, Disp: 100 each, Rfl: 3   pantoprazole (PROTONIX) 40 MG tablet, Take 1 tablet (40 mg total) by mouth 2 (two) times daily., Disp: 180 tablet, Rfl: 3   promethazine (PHENERGAN) 12.5 MG tablet, TAKE 1 TO 2 TABLETS EVERY 8 HOURS AS NEEDED FOR NAUSEA AND VOMITING, Disp: 60 tablet, Rfl: 1   psyllium (METAMUCIL) 58.6 % powder, Take 1 packet by mouth 2 (two) times daily., Disp: , Rfl:    rosuvastatin (CRESTOR) 5 MG tablet, TAKE ONE (1) TABLET BY MOUTH EVERY DAY, Disp: 90 tablet, Rfl: 3   STEGLATRO 15 MG TABS tablet, TAKE 1 TABLET BY MOUTH EVERY MORNING BEFORE BREAKFAST, Disp: 90 tablet, Rfl: 0   tiZANidine (ZANAFLEX) 4 MG tablet, TAKE 1 TABLET BY MOUTH EVERY 8 HOURS AS NEEDED FOR MUSCLE SPASMS, Disp: 90 tablet, Rfl: 0   vitamin B-12 (CYANOCOBALAMIN) 250 MCG tablet, Take 1 tablet (250 mcg total) by mouth daily. For low B-12 supplementation, Disp: 1 tablet, Rfl:   Assessment/ Plan: 52 y.o. male   Gastroesophageal reflux disease with esophagitis without hemorrhage - Plan: famotidine (PEPCID) 20 MG tablet  Add Pepcid 20 mg twice daily.  I considered switching his PPI but given his ongoing symptoms right now I do not think that would provide him sufficient relief in a timely manner.  He is going to use this for the next 2 to 3 weeks and if he does not see any substantial improvement over the next 2 to 3 weeks, low threshold to have him see gastroenterology for further evaluation.  GERD is complicated by history of gastric bypass surgery and current use of GLP.  Start time: 4:45pm End time: 4:50pm  Total time spent on patient care (including video visit/ documentation): 5 minutes  [place], [place] [phone]

## 2023-03-18 LAB — HM DIABETES EYE EXAM

## 2023-04-08 DIAGNOSIS — E113293 Type 2 diabetes mellitus with mild nonproliferative diabetic retinopathy without macular edema, bilateral: Secondary | ICD-10-CM | POA: Diagnosis not present

## 2023-04-08 DIAGNOSIS — H40033 Anatomical narrow angle, bilateral: Secondary | ICD-10-CM | POA: Diagnosis not present

## 2023-04-25 DIAGNOSIS — F3112 Bipolar disorder, current episode manic without psychotic features, moderate: Secondary | ICD-10-CM | POA: Diagnosis not present

## 2023-04-25 DIAGNOSIS — F3176 Bipolar disorder, in full remission, most recent episode depressed: Secondary | ICD-10-CM | POA: Diagnosis not present

## 2023-04-25 DIAGNOSIS — F410 Panic disorder [episodic paroxysmal anxiety] without agoraphobia: Secondary | ICD-10-CM | POA: Diagnosis not present

## 2023-05-05 DIAGNOSIS — E1151 Type 2 diabetes mellitus with diabetic peripheral angiopathy without gangrene: Secondary | ICD-10-CM | POA: Diagnosis not present

## 2023-05-05 DIAGNOSIS — E785 Hyperlipidemia, unspecified: Secondary | ICD-10-CM | POA: Diagnosis not present

## 2023-05-05 DIAGNOSIS — E039 Hypothyroidism, unspecified: Secondary | ICD-10-CM | POA: Diagnosis not present

## 2023-05-05 DIAGNOSIS — K219 Gastro-esophageal reflux disease without esophagitis: Secondary | ICD-10-CM | POA: Diagnosis not present

## 2023-05-05 DIAGNOSIS — R32 Unspecified urinary incontinence: Secondary | ICD-10-CM | POA: Diagnosis not present

## 2023-05-05 DIAGNOSIS — E669 Obesity, unspecified: Secondary | ICD-10-CM | POA: Diagnosis not present

## 2023-05-05 DIAGNOSIS — Z833 Family history of diabetes mellitus: Secondary | ICD-10-CM | POA: Diagnosis not present

## 2023-05-05 DIAGNOSIS — E1142 Type 2 diabetes mellitus with diabetic polyneuropathy: Secondary | ICD-10-CM | POA: Diagnosis not present

## 2023-05-05 DIAGNOSIS — M1990 Unspecified osteoarthritis, unspecified site: Secondary | ICD-10-CM | POA: Diagnosis not present

## 2023-05-05 DIAGNOSIS — F419 Anxiety disorder, unspecified: Secondary | ICD-10-CM | POA: Diagnosis not present

## 2023-05-05 DIAGNOSIS — F319 Bipolar disorder, unspecified: Secondary | ICD-10-CM | POA: Diagnosis not present

## 2023-05-05 DIAGNOSIS — Z888 Allergy status to other drugs, medicaments and biological substances status: Secondary | ICD-10-CM | POA: Diagnosis not present

## 2023-05-13 ENCOUNTER — Other Ambulatory Visit: Payer: Self-pay | Admitting: Family Medicine

## 2023-05-13 DIAGNOSIS — K2100 Gastro-esophageal reflux disease with esophagitis, without bleeding: Secondary | ICD-10-CM

## 2023-05-13 DIAGNOSIS — E1169 Type 2 diabetes mellitus with other specified complication: Secondary | ICD-10-CM

## 2023-06-06 DIAGNOSIS — F3111 Bipolar disorder, current episode manic without psychotic features, mild: Secondary | ICD-10-CM | POA: Diagnosis not present

## 2023-06-06 DIAGNOSIS — F3132 Bipolar disorder, current episode depressed, moderate: Secondary | ICD-10-CM | POA: Diagnosis not present

## 2023-06-06 DIAGNOSIS — F410 Panic disorder [episodic paroxysmal anxiety] without agoraphobia: Secondary | ICD-10-CM | POA: Diagnosis not present

## 2023-06-11 ENCOUNTER — Other Ambulatory Visit: Payer: Self-pay | Admitting: Family Medicine

## 2023-06-11 DIAGNOSIS — E1169 Type 2 diabetes mellitus with other specified complication: Secondary | ICD-10-CM

## 2023-06-25 ENCOUNTER — Other Ambulatory Visit: Payer: Self-pay | Admitting: Family

## 2023-06-25 DIAGNOSIS — M5136 Other intervertebral disc degeneration, lumbar region: Secondary | ICD-10-CM

## 2023-06-27 ENCOUNTER — Encounter: Payer: Self-pay | Admitting: Family Medicine

## 2023-06-27 ENCOUNTER — Ambulatory Visit (INDEPENDENT_AMBULATORY_CARE_PROVIDER_SITE_OTHER): Payer: 59 | Admitting: Family Medicine

## 2023-06-27 VITALS — BP 117/63 | HR 58 | Temp 97.4°F | Ht 70.0 in | Wt 252.2 lb

## 2023-06-27 DIAGNOSIS — E039 Hypothyroidism, unspecified: Secondary | ICD-10-CM

## 2023-06-27 DIAGNOSIS — E1169 Type 2 diabetes mellitus with other specified complication: Secondary | ICD-10-CM | POA: Diagnosis not present

## 2023-06-27 DIAGNOSIS — I152 Hypertension secondary to endocrine disorders: Secondary | ICD-10-CM

## 2023-06-27 DIAGNOSIS — F39 Unspecified mood [affective] disorder: Secondary | ICD-10-CM

## 2023-06-27 DIAGNOSIS — E785 Hyperlipidemia, unspecified: Secondary | ICD-10-CM

## 2023-06-27 DIAGNOSIS — B351 Tinea unguium: Secondary | ICD-10-CM

## 2023-06-27 DIAGNOSIS — Z0001 Encounter for general adult medical examination with abnormal findings: Secondary | ICD-10-CM | POA: Diagnosis not present

## 2023-06-27 DIAGNOSIS — Z794 Long term (current) use of insulin: Secondary | ICD-10-CM | POA: Diagnosis not present

## 2023-06-27 DIAGNOSIS — N400 Enlarged prostate without lower urinary tract symptoms: Secondary | ICD-10-CM | POA: Diagnosis not present

## 2023-06-27 DIAGNOSIS — Z0000 Encounter for general adult medical examination without abnormal findings: Secondary | ICD-10-CM

## 2023-06-27 LAB — BAYER DCA HB A1C WAIVED: HB A1C (BAYER DCA - WAIVED): 5.9 % — ABNORMAL HIGH (ref 4.8–5.6)

## 2023-06-27 NOTE — Patient Instructions (Signed)
Go back on your meds. Choose Low carb/ No carb snacks.

## 2023-06-27 NOTE — Progress Notes (Unsigned)
Marvin Espinoza is a 53 y.o. male presents to office today for annual physical exam examination.    Concerns today include: 1. Type 2 Diabetes with hypertension, hyperlipidemia:  Patient is accompanied by his wife today.  He notes that he has been compliant with medication for blood sugar control but has been needing closer to 40 units of his [place]ana Chambers recently.  Blood sugars have been in the 130s a lot more than he prefers.  He prefers to have his blood sugar staying in the 80s.  He admits that when he was placed on lithium, Prozac and Zyprexa his appetite was more and he was gaining weight and therefore he self discontinued the medications.  Last eye exam: UTD Last foot exam: UTD Last A1c:  Lab Results  Component Value Date   HGBA1C 5.9 (H) 06/27/2023   Nephropathy screen indicated?: needs Last flu, zoster and/or pneumovax:  Immunization History  Administered Date(s) Administered   H1N1 11/03/2008   Influenza,inj,Quad PF,6+ Mos 11/19/2016, 10/20/2017, 09/11/2018, 08/20/2019, 09/27/2020   Influenza,inj,quad, With Preservative 12/16/2017   Influenza-Unspecified 10/10/2021   Moderna Sars-Covid-2 Vaccination 03/04/2020, 04/05/2020, 10/09/2020   PNEUMOCOCCAL CONJUGATE-20 12/24/2021   Tdap 05/10/2020   Zoster Recombinant(Shingrix) 08/01/2021, 10/08/2021    2.  Mood disorder Patient recently restarted on lithium, which causes him to feel tremulous,, Prozac and Zyprexa.  He felt like he was having higher appetite and weight gain and so he discontinued the medications.  He reports manic type symptoms with difficulty sleeping, mood disturbances and sometimes where he "will blackout" when he is having a mood episode.  He feels at a loss because one of the medications that had been somewhat helpful his insurance will absolutely not pay for.  Occupation: unemployed, Marital status: married, Substance use: none Diet: typical [place], Exercise: no structured Last colonoscopy: UTD Refills needed  today: none Immunizations needed: Immunization History  Administered Date(s) Administered   H1N1 11/03/2008   Influenza,inj,Quad PF,6+ Mos 11/19/2016, 10/20/2017, 09/11/2018, 08/20/2019, 09/27/2020   Influenza,inj,quad, With Preservative 12/16/2017   Influenza-Unspecified 10/10/2021   Moderna Sars-Covid-2 Vaccination 03/04/2020, 04/05/2020, 10/09/2020   PNEUMOCOCCAL CONJUGATE-20 12/24/2021   Tdap 05/10/2020   Zoster Recombinant(Shingrix) 08/01/2021, 10/08/2021     Past Medical History:  Diagnosis Date   Anxiety    Bipolar disorder ([facility])    CTS (carpal tunnel syndrome)    Depression    Diabetes mellitus without complication ([facility])    GERD (gastroesophageal reflux disease)    H/O bariatric surgery 2016   Head injury 05/06/2017   Hypothyroidism    Hypothyroidism    Sleep apnea    no longer with OSA d/t over 100 pd weight loss   Social History   Socioeconomic History   Marital status: Married    Spouse name: Vernona Rieger   Number of children: 1   Years of education: Not on file   Highest education level: Not on file  Occupational History   Not on file  Tobacco Use   Smoking status: Never   Smokeless tobacco: Never  Vaping Use   Vaping status: Never Used  Substance and Sexual Activity   Alcohol use: No    Comment: severe allergy to alcohol!!   Drug use: No   Sexual activity: Yes  Other Topics Concern   Not on file  Social History Narrative   Lives with Wife, and one child   Two story   He stays there to keep an eye on the house.    His son comes to visit also.  Right handed.   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          Social Determinants of Health   Financial Resource Strain: Low Risk  (10/04/2018)   Overall Financial Resource Strain (CARDIA)    Difficulty of Paying Living Expenses: Not hard at all  Food Insecurity: No Food Insecurity (10/04/2018)   Hunger Vital Sign    Worried About Running Out of Food in the Last Year: Never true    Ran Out of Food in the  Last Year: Never true  Transportation Needs: No Transportation Needs (10/04/2018)   PRAPARE - Administrator, Civil Service (Medical): No    Lack of Transportation (Non-Medical): No  Physical Activity: Not on file  Stress: Not on file  Social Connections: Not on file  Intimate Partner Violence: Not on file   Past Surgical History:  Procedure Laterality Date   ANAL FISSURE REPAIR     BARIATRIC SURGERY     CARPAL TUNNEL RELEASE Left 05/09/2015   Procedure: LEFT CARPAL TUNNEL RELEASE;  Surgeon: Cindee Salt, MD;  Location: [facility];  Service: Orthopedics;  Laterality: Left;   CARPAL TUNNEL RELEASE Left 05-09-2015   CARPAL TUNNEL RELEASE Right 06/20/2015   Procedure: RIGHT CARPAL TUNNEL RELEASE;  Surgeon: Cindee Salt, MD;  Location: [facility];  Service: Orthopedics;  Laterality: Right;  REGIONAL/FAB   CHOLECYSTECTOMY     COLONOSCOPY     UPPER GASTROINTESTINAL ENDOSCOPY     Family History  Problem Relation Age of Onset   Diabetes Mother    Heart disease Mother    Depression Mother    Esophageal cancer Mother    COPD Father    Heart disease Father    Kidney disease Father    Mental illness Father    Depression Father    Mental illness Sister    Depression Sister    Depression Son    Diabetes Mellitus II Son    Hyperlipidemia Son    Esophageal cancer Maternal Uncle    Colon cancer Neg Hx    Rectal cancer Neg Hx    Stomach cancer Neg Hx     Current Outpatient Medications:    ALPRAZolam (XANAX) 1 MG tablet, 1 mg. Six times per day prn, Disp: , Rfl:    Blood Glucose Monitoring Suppl (ONETOUCH VERIO) w/Device KIT, Use to test blood sugar daily as directed, Disp: 1 kit, Rfl: 0   Calcium-Vitamin D-Vitamin K (SM CALCIUM SOFT CHEWS PO), Take 1 application by mouth 2 (two) times daily. Chew calcium tabs twice a day.  (bariatric surgery), Disp: , Rfl:    docusate sodium (COLACE) 100 MG capsule, Take 100 mg by mouth 2 (two) times daily., Disp: ,  Rfl:    famotidine (PEPCID) 20 MG tablet, TAKE ONE (1) TABLET BY MOUTH TWO (2) TIMES DAILY, Disp: 180 tablet, Rfl: 0   FLUoxetine (PROZAC) 20 MG tablet, Take 20 mg by mouth daily., Disp: , Rfl:    gabapentin (NEURONTIN) 300 MG capsule, TAKE 1 CAPSULE BY MOUTH THREE TIMES A DAY, Disp: 270 capsule, Rfl: 3   glucose blood test strip, test blood sugar 2x daily Dx E11.9, Disp: 100 each, Rfl: 3   hydrOXYzine (VISTARIL) 25 MG capsule, Take 25 mg by mouth 3 (three) times daily., Disp: , Rfl:    lamoTRIgine (LAMICTAL) 200 MG tablet, Take 400 mg by mouth at bedtime. , Disp: , Rfl:    levothyroxine (SYNTHROID) 125 MCG tablet, Take 125 mcg  by mouth daily before breakfast., Disp: , Rfl:    Magnesium Gluconate (MAGNESIUM 27 PO), Take by mouth., Disp: , Rfl:    metFORMIN (GLUCOPHAGE) 1000 MG tablet, Take 1 tablet (1,000 mg total) by mouth 2 (two) times daily with a meal., Disp: 180 tablet, Rfl: 3   Multiple Vitamin (MULTIVITAMIN WITH MINERALS) TABS tablet, Take 1 tablet by mouth 2 (two) times daily. For Vitamin supplementation (Patient taking differently: Take 1 tablet by mouth 2 (two) times daily. For Vitamin supplementation FROM GASTRIC BYPASS), Disp: , Rfl:    OLANZapine (ZYPREXA) 2.5 MG tablet, Take 2.5 mg by mouth at bedtime., Disp: , Rfl:    OneTouch Delica Lancets 30G MISC, Use to test blood sugar daily as directed, Disp: 100 each, Rfl: 3   pantoprazole (PROTONIX) 40 MG tablet, Take 1 tablet (40 mg total) by mouth 2 (two) times daily., Disp: 180 tablet, Rfl: 3   promethazine (PHENERGAN) 12.5 MG tablet, TAKE 1 TO 2 TABLETS EVERY 8 HOURS AS NEEDED FOR NAUSEA AND VOMITING, Disp: 60 tablet, Rfl: 1   psyllium (METAMUCIL) 58.6 % powder, Take 1 packet by mouth 2 (two) times daily., Disp: , Rfl:    rosuvastatin (CRESTOR) 5 MG tablet, TAKE ONE (1) TABLET BY MOUTH EVERY DAY, Disp: 90 tablet, Rfl: 3   SOLIQUA 100-33 UNT-MCG/ML SOPN, INJECT 40-60 UNITS IN TO THE SKIN IN THEMORNING, Disp: 15 mL, Rfl: 0   STEGLATRO 15  MG TABS tablet, TAKE 1 TABLET BY MOUTH EVERY MORNING BEFORE BREAKFAST, Disp: 90 tablet, Rfl: 0   tiZANidine (ZANAFLEX) 4 MG tablet, TAKE 1 TABLET BY MOUTH EVERY 8 HOURS AS NEEDED FOR MUSCLE SPASMS, Disp: 90 tablet, Rfl: 2   vitamin B-12 (CYANOCOBALAMIN) 250 MCG tablet, Take 1 tablet (250 mcg total) by mouth daily. For low B-12 supplementation, Disp: 1 tablet, Rfl:   Allergies  Allergen Reactions   Abilify [Aripiprazole] Anaphylaxis and Swelling    "slurred speech" FACIAL DROOPING, TONGUE SWELLING, TARDIVE DYSKENESIA    Caplyta [Lumateperone] Anaphylaxis   Seroquel [Quetiapine Fumarate]     tardive dyskinesia   Alcohol Itching    Drinking alcohol   Ambien [Zolpidem] Other (See Comments)    NIGHT TERRORS   Anacin-3 [Acetaminophen] Itching    REGULAR TYLENOL IS OKAY.  NEEDS BENADRYL TO TAKE THIS   Cariprazine Other (See Comments)    Tardive dyskenesia   Cariprazine Hcl Itching   Latuda [Lurasidone Hcl] Other (See Comments)    RUNS SUGAR TOO HIGH   Lurasidone Other (See Comments)    TARDIVE DYSKINESIA   Prozac [Fluoxetine] Itching   Victoza [Liraglutide] Other (See Comments)    Severe heartburn    Bupropion Rash   Hydrocodone-Acetaminophen Itching   Risperidone Rash   Tegretol [Carbamazepine] Rash     ROS: Review of Systems A comprehensive review of systems was negative except for: Behavioral/Psych: positive for anxiety, bad mood, depression, mood swings, and sleep disturbance    Physical exam BP 117/63   Pulse (!) 58   Temp (!) 97.4 F (36.3 C)   Ht 5\' 10"  (1.778 m)   Wt 252 lb 3.2 oz (114.4 kg)   SpO2 98%   BMI 36.19 kg/m  General appearance: alert, cooperative, appears stated age, and anxious Head: Normocephalic, without obvious abnormality, atraumatic Eyes: negative findings: lids and lashes normal, conjunctivae and sclerae normal, corneas clear, and pupils equal, round, reactive to light and accomodation Ears: normal TM's and external ear canals both ears Nose:  Nares normal. Septum midline. Mucosa normal. No  drainage or sinus tenderness. Throat: lips, mucosa, and tongue normal; teeth and gums normal Neck: no adenopathy, supple, symmetrical, trachea midline, and thyroid not enlarged, symmetric, no tenderness/mass/nodules Back: symmetric, no curvature. ROM normal. No CVA tenderness. Lungs: clear to auscultation bilaterally Chest wall: no tenderness Heart: regular rate and rhythm, S1, S2 normal, no murmur, click, rub or gallop Abdomen:  obese Extremities: extremities normal, atraumatic, no cyanosis or edema; onychomycotic changes noted through the great toenails bilaterally with lifting from the nail bed.  He also has discoloration of the fourth digit toenail on the right foot Pulses: 2+ and symmetric Skin: Skin color, texture, turgor normal. No rashes or lesions Lymph nodes: Cervical, supraclavicular, and axillary nodes normal. Neurologic: Grossly normal Psych: Anxious, tangential thought.  Very pleasant, interactive.  Does not appear to be responding to internal stimuli     06/27/2023    8:25 AM 06/27/2023    8:09 AM 12/23/2022    8:11 AM  Depression screen PHQ 2/9  Decreased Interest 3 0 2  Down, Depressed, Hopeless 2 0 2  PHQ - 2 Score 5 0 4  Altered sleeping 3  3  Tired, decreased energy 3  3  Change in appetite 3  3  Feeling bad or failure about yourself  2  3  Trouble concentrating 3  3  Moving slowly or fidgety/restless 2  1  Suicidal thoughts 0  1  PHQ-9 Score 21  21  Difficult doing work/chores Very difficult  Extremely dIfficult      06/27/2023    8:30 AM 12/23/2022    8:12 AM 08/21/2022    8:13 AM 08/14/2022    8:16 AM  GAD 7 : Generalized Anxiety Score  Nervous, Anxious, on Edge 3 3 3 3   Control/stop worrying 3 3 3 3   Worry too much - different things 3 3 3 3   Trouble relaxing 3 3 3 2   Restless 2 2 3 2   Easily annoyed or irritable 3 3 3 3   Afraid - awful might happen 1 0 1   Total GAD 7 Score 18 17 19    Anxiety Difficulty   Extremely difficult Extremely difficult Somewhat difficult    Assessment/ Plan: [name] here for annual physical exam.   Annual physical exam  Controlled type 2 diabetes mellitus with other specified complication, with long-term current use of insulin (HCC) - Plan: Microalbumin / creatinine urine ratio, Bayer DCA Hb A1c Waived, CMP14+EGFR  Onychomycosis - Plan: Ambulatory referral to Podiatry  Hyperlipidemia associated with type 2 diabetes mellitus ([facility]) - Plan: Lipid Panel, CMP14+EGFR  Acquired hypothyroidism - Plan: TSH, T4, Free, CMP14+EGFR  Benign prostatic hyperplasia without lower urinary tract symptoms - Plan: PSA  Mood disorder (HCC)  Urine microalbumin, A1c and other fasting labs collected today.  His sugar was well-controlled with A1c of 5.9 today.  Foot exam was notable for onychomycotic changes to bilateral great toenails with lifting from the nail bed.  1 toenail appeared to have a slight hematoma underneath it.  I have subsequently referred him to podiatry for further evaluation and management  He will continue statin low-dose.  Check thyroid levels given reports of mood instability but I suspect much of this stems from discontinuing the medication.  I encouraged him to at minimum resume use of Zyprexa and Prozac.  We had a frank discussion that this atypical will in fact increase his appetite and that is unfortunately just a side effect of the medication.  However, we did discuss that if he  makes meal plans and attempts to eliminate foods that he knows will increase his weight and his blood sugar then he should not have as much of a problem.  We discussed eliminating carbohydrates from the cabinets and simply surrounding with more vegetables and low caloric density foods.  I gave him a handout on this today and I would like to see him back in 3 months for recheck, sooner if concerns arise  Check PSA though patient had no symptoms of BPH today  Counseled on healthy  lifestyle choices, including diet (rich in fruits, vegetables and lean meats and low in salt and simple carbohydrates) and exercise (at least 30 minutes of moderate physical activity daily).  Patient to follow up in 1 year for annual exam or sooner if needed.  Tannar Broker M. Nadine Counts, DO

## 2023-06-28 ENCOUNTER — Encounter: Payer: Self-pay | Admitting: Family Medicine

## 2023-06-28 LAB — LIPID PANEL
Chol/HDL Ratio: 2.1 ratio (ref 0.0–5.0)
Cholesterol, Total: 103 mg/dL (ref 100–199)
HDL: 48 mg/dL (ref >39)
LDL Chol Calc (NIH): 41 mg/dL (ref 0–99)
Triglycerides: 65 mg/dL (ref 0–149)
VLDL Cholesterol Cal: 14 mg/dL (ref 5–40)

## 2023-06-28 LAB — CMP14+EGFR
ALT: 53 IU/L — ABNORMAL HIGH (ref 0–44)
AST: 53 IU/L — ABNORMAL HIGH (ref 0–40)
Albumin: 4.1 g/dL (ref 3.8–4.9)
Alkaline Phosphatase: 76 IU/L (ref 44–121)
BUN/Creatinine Ratio: 13 (ref 9–20)
BUN: 13 mg/dL (ref 6–24)
Bilirubin Total: 0.3 mg/dL (ref 0.0–1.2)
CO2: 22 mmol/L (ref 20–29)
Calcium: 8.8 mg/dL (ref 8.7–10.2)
Chloride: 104 mmol/L (ref 96–106)
Creatinine, Ser: 0.99 mg/dL (ref 0.76–1.27)
Globulin, Total: 2 g/dL (ref 1.5–4.5)
Glucose: 85 mg/dL (ref 70–99)
Potassium: 4.7 mmol/L (ref 3.5–5.2)
Sodium: 141 mmol/L (ref 134–144)
Total Protein: 6.1 g/dL (ref 6.0–8.5)
eGFR: 91 mL/min/{1.73_m2} (ref >59)

## 2023-06-28 LAB — PSA: Prostate Specific Ag, Serum: 0.2 ng/mL (ref 0.0–4.0)

## 2023-06-28 LAB — MICROALBUMIN / CREATININE URINE RATIO
Creatinine, Urine: 49.8 mg/dL
Microalb/Creat Ratio: <6 mg/g creat (ref 0–29)
Microalbumin, Urine: <3 ug/mL

## 2023-06-28 LAB — TSH: TSH: 0.876 u[IU]/mL (ref 0.450–4.500)

## 2023-06-28 LAB — T4, FREE: Free T4: 1.09 ng/dL (ref 0.82–1.77)

## 2023-07-11 ENCOUNTER — Other Ambulatory Visit: Payer: Self-pay | Admitting: Family Medicine

## 2023-07-11 DIAGNOSIS — E1169 Type 2 diabetes mellitus with other specified complication: Secondary | ICD-10-CM

## 2023-07-17 ENCOUNTER — Encounter: Payer: Self-pay | Admitting: Podiatry

## 2023-07-17 ENCOUNTER — Ambulatory Visit: Payer: 59 | Admitting: Podiatry

## 2023-07-17 VITALS — BP 156/93 | HR 66

## 2023-07-17 DIAGNOSIS — Z79899 Other long term (current) drug therapy: Secondary | ICD-10-CM

## 2023-07-17 NOTE — Patient Instructions (Addendum)
You can use UREA NAIL GEL as well for the thicker toenails   Terbinafine Tablets What is this medication? TERBINAFINE (TER bin a feen) treats fungal infections of the nails. It belongs to a group of medications called antifungals. It will not treat infections caused by bacteria or viruses. This medicine may be used for other purposes; ask your health care provider or pharmacist if you have questions. COMMON BRAND NAME(S): Lamisil, Terbinex What should I tell my care team before I take this medication? They need to know if you have any of these conditions: Liver disease An unusual or allergic reaction to terbinafine, other medications, foods, dyes, or preservatives Pregnant or trying to get pregnant Breast-feeding How should I use this medication? Take this medication by mouth with water. Take it as directed on the prescription label at the same time every day. You can take it with or without food. If it upsets your stomach, take it with food. Keep taking it unless your care team tells you to stop. A special MedGuide will be given to you by the pharmacist with each prescription and refill. Be sure to read this information carefully each time. Talk to your care team regarding the use of this medication in children. Special care may be needed. Overdosage: If you think you have taken too much of this medicine contact a poison control center or emergency room at once. NOTE: This medicine is only for you. Do not share this medicine with others. What if I miss a dose? If you miss a dose, take it as soon as you can unless it is more than 4 hours late. If it is more than 4 hours late, skip the missed dose. Take the next dose at the normal time. What may interact with this medication? Do not take this medication with any of the following: Pimozide Thioridazine This medication may also interact with the following: Beta blockers Caffeine Certain medications for mental health  conditions Cimetidine Cyclosporine Medications for fungal infections like fluconazole and ketoconazole Medications for irregular heartbeat like amiodarone, flecainide and propafenone Rifampin Warfarin This list may not describe all possible interactions. Give your health care provider a list of all the medicines, herbs, non-prescription drugs, or dietary supplements you use. Also tell them if you smoke, drink alcohol, or use illegal drugs. Some items may interact with your medicine. What should I watch for while using this medication? Visit your care team for regular checks on your progress. You may need blood work while you are taking this medication. It may be some time before you see the benefit from this medication. This medication may cause serious skin reactions. They can happen weeks to months after starting the medication. Contact your care team right away if you notice fevers or flu-like symptoms with a rash. The rash may be red or purple and then turn into blisters or peeling of the skin. Or, you might notice a red rash with swelling of the face, lips or lymph nodes in your neck or under your arms. This medication can make you more sensitive to the sun. Keep out of the sun, If you cannot avoid being in the sun, wear protective clothing and sunscreen. Do not use sun lamps or tanning beds/booths. What side effects may I notice from receiving this medication? Side effects that you should report to your care team as soon as possible: Allergic reactions--skin rash, itching, hives, swelling of the face, lips, tongue, or throat Change in sense of smell Change in taste Infection--fever,  chills, cough, or sore throat Liver injury--right upper belly pain, loss of appetite, nausea, light-colored stool, dark yellow or brown urine, yellowing skin or eyes, unusual weakness or fatigue Low red blood cell level--unusual weakness or fatigue, dizziness, headache, trouble breathing Lupus-like  syndrome--joint pain, swelling, or stiffness, butterfly-shaped rash on the face, rashes that get worse in the sun, fever, unusual weakness or fatigue Rash, fever, and swollen lymph nodes Redness, blistering, peeling, or loosening of the skin, including inside the mouth Unusual bruising or bleeding Worsening mood, feelings of depression Side effects that usually do not require medical attention (report to your care team if they continue or are bothersome): Diarrhea Gas Headache Nausea Stomach pain Upset stomach This list may not describe all possible side effects. Call your doctor for medical advice about side effects. You may report side effects to FDA at 1-800-FDA-1088. Where should I keep my medication? Keep out of the reach of children and pets. Store between 20 and 25 degrees C (68 and 77 degrees F). Protect from light. Get rid of any unused medication after the expiration date. To get rid of medications that are no longer needed or have expired: Take the medication to a medication take-back program. Check with your pharmacy or law enforcement to find a location. If you cannot return the medication, check the label or package insert to see if the medication should be thrown out in the garbage or flushed down the toilet. If you are not sure, ask your care team. If it is safe to put it in the trash, take the medication out of the container. Mix the medication with cat litter, dirt, coffee grounds, or other unwanted substance. Seal the mixture in a bag or container. Put it in the trash. NOTE: This sheet is a summary. It may not cover all possible information. If you have questions about this medicine, talk to your doctor, pharmacist, or health care provider.  2024 Elsevier/Gold Standard (2021-07-18 00:00:00)

## 2023-07-17 NOTE — Progress Notes (Signed)
Subjective:   Patient ID: [name], male   DOB: 52 y.o.   MRN: [id number]   HPI Chief Complaint  Patient presents with   Nail Problem    Several toes on both feet. No injuries, has been doing fungal treatments that doesn't seem to be helping.    52 year old male presents the office for above concerns.  He was on cicliporix previously no pain to the nails. No drainge or swelling.   A1c 5.9   Review of Systems  All other systems reviewed and are negative.   Past Medical History:  Diagnosis Date   Anxiety    Bipolar disorder (HCC)    CTS (carpal tunnel syndrome)    Depression    Diabetes mellitus without complication (HCC)    GERD (gastroesophageal reflux disease)    H/O bariatric surgery 2016   Head injury 05/06/2017   Hypothyroidism    Hypothyroidism    Sleep apnea    no longer with OSA d/t over 100 pd weight loss    Past Surgical History:  Procedure Laterality Date   ANAL FISSURE REPAIR     BARIATRIC SURGERY     CARPAL TUNNEL RELEASE Left 05/09/2015   Procedure: LEFT CARPAL TUNNEL RELEASE;  Surgeon: Cindee Salt, MD;  Location: [facility];  Service: Orthopedics;  Laterality: Left;   CARPAL TUNNEL RELEASE Left 05-09-2015   CARPAL TUNNEL RELEASE Right 06/20/2015   Procedure: RIGHT CARPAL TUNNEL RELEASE;  Surgeon: Cindee Salt, MD;  Location: [facility];  Service: Orthopedics;  Laterality: Right;  REGIONAL/FAB   CHOLECYSTECTOMY     COLONOSCOPY     UPPER GASTROINTESTINAL ENDOSCOPY       Current Outpatient Medications:    ALPRAZolam (XANAX) 1 MG tablet, 1 mg. Six times per day prn, Disp: , Rfl:    Blood Glucose Monitoring Suppl (ONETOUCH VERIO) w/Device KIT, Use to test blood sugar daily as directed, Disp: 1 kit, Rfl: 0   Calcium-Vitamin D-Vitamin K (SM CALCIUM SOFT CHEWS PO), Take 1 application by mouth 2 (two) times daily. Chew calcium tabs twice a day.  (bariatric surgery), Disp: , Rfl:    docusate sodium (COLACE) 100 MG capsule, Take 100  mg by mouth 2 (two) times daily., Disp: , Rfl:    famotidine (PEPCID) 20 MG tablet, TAKE ONE (1) TABLET BY MOUTH TWO (2) TIMES DAILY, Disp: 180 tablet, Rfl: 0   FLUoxetine (PROZAC) 20 MG tablet, Take 20 mg by mouth daily., Disp: , Rfl:    gabapentin (NEURONTIN) 300 MG capsule, TAKE 1 CAPSULE BY MOUTH THREE TIMES A DAY, Disp: 270 capsule, Rfl: 3   glucose blood test strip, test blood sugar 2x daily Dx E11.9, Disp: 100 each, Rfl: 3   hydrOXYzine (VISTARIL) 25 MG capsule, Take 25 mg by mouth 3 (three) times daily., Disp: , Rfl:    lamoTRIgine (LAMICTAL) 200 MG tablet, Take 400 mg by mouth at bedtime. , Disp: , Rfl:    levothyroxine (SYNTHROID) 125 MCG tablet, Take 125 mcg by mouth daily before breakfast., Disp: , Rfl:    Magnesium Gluconate (MAGNESIUM 27 PO), Take by mouth., Disp: , Rfl:    metFORMIN (GLUCOPHAGE) 1000 MG tablet, Take 1 tablet (1,000 mg total) by mouth 2 (two) times daily with a meal., Disp: 180 tablet, Rfl: 3   Multiple Vitamin (MULTIVITAMIN WITH MINERALS) TABS tablet, Take 1 tablet by mouth 2 (two) times daily. For Vitamin supplementation (Patient taking differently: Take 1 tablet by mouth 2 (two) times daily. For Vitamin  supplementation FROM GASTRIC BYPASS), Disp: , Rfl:    OLANZapine (ZYPREXA) 2.5 MG tablet, Take 2.5 mg by mouth at bedtime., Disp: , Rfl:    OneTouch Delica Lancets 30G MISC, Use to test blood sugar daily as directed, Disp: 100 each, Rfl: 3   pantoprazole (PROTONIX) 40 MG tablet, Take 1 tablet (40 mg total) by mouth 2 (two) times daily., Disp: 180 tablet, Rfl: 3   promethazine (PHENERGAN) 12.5 MG tablet, TAKE 1 TO 2 TABLETS EVERY 8 HOURS AS NEEDED FOR NAUSEA AND VOMITING, Disp: 60 tablet, Rfl: 1   psyllium (METAMUCIL) 58.6 % powder, Take 1 packet by mouth 2 (two) times daily., Disp: , Rfl:    rosuvastatin (CRESTOR) 5 MG tablet, TAKE ONE (1) TABLET BY MOUTH EVERY DAY, Disp: 90 tablet, Rfl: 3   SOLIQUA 100-33 UNT-MCG/ML SOPN, INJECT 40-60 UNITS IN TO THE SKIN IN  THEMORNING, Disp: 15 mL, Rfl: 0   STEGLATRO 15 MG TABS tablet, TAKE 1 TABLET BY MOUTH EVERY MORNING BEFORE BREAKFAST, Disp: 90 tablet, Rfl: 0   vitamin B-12 (CYANOCOBALAMIN) 250 MCG tablet, Take 1 tablet (250 mcg total) by mouth daily. For low B-12 supplementation, Disp: 1 tablet, Rfl:    tiZANidine (ZANAFLEX) 4 MG tablet, TAKE 1 TABLET BY MOUTH EVERY 8 HOURS AS NEEDED FOR MUSCLE SPASMS (Patient not taking: Reported on 07/17/2023), Disp: 90 tablet, Rfl: 2  Allergies  Allergen Reactions   Abilify [Aripiprazole] Anaphylaxis and Swelling    "slurred speech" FACIAL DROOPING, TONGUE SWELLING, TARDIVE DYSKENESIA    Caplyta [Lumateperone] Anaphylaxis   Seroquel [Quetiapine Fumarate]     tardive dyskinesia   Alcohol Itching    Drinking alcohol   Ambien [Zolpidem] Other (See Comments)    NIGHT TERRORS   Anacin-3 [Acetaminophen] Itching    REGULAR TYLENOL IS OKAY.  NEEDS BENADRYL TO TAKE THIS   Cariprazine Other (See Comments)    Tardive dyskenesia   Cariprazine Hcl Itching   Latuda [Lurasidone Hcl] Other (See Comments)    RUNS SUGAR TOO HIGH   Lurasidone Other (See Comments)    TARDIVE DYSKINESIA   Prozac [Fluoxetine] Itching   Victoza [Liraglutide] Other (See Comments)    Severe heartburn    Bupropion Rash   Hydrocodone-Acetaminophen Itching   Risperidone Rash   Tegretol [Carbamazepine] Rash         Objective:  Physical Exam   General: AAO x3, NAD  Dermatological: Bilateral hallux nails are hypertrophic, dystrophic with yellow, brown discoloration and there is dried blood present underneath the right hallux toenail.  There is no extension of hyperpigmentation of the surrounding skin.  No edema, erythema.  Other nails are mildly dystrophic with discoloration.  No open lesions.  Vascular: Dorsalis Pedis artery and Posterior Tibial artery pedal pulses are 2/4 bilateral with immedate capillary fill time.  There is no pain with calf compression, swelling, warmth, erythema.    Neruologic: Grossly intact via light touch bilateral.   Musculoskeletal: No other areas of discomfort.  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  Open we decided to proceed with oral medication.  Previously had an elevated LFT. Will recheck prior. If we do oral medication will need to recheck blood work monthly. If we don't do oral medication we can try other topical. Also discussed urea to help with the dystrophy.   Lesia Sago  Woodson Macha DPM     -check blood work [email or web address]

## 2023-08-12 ENCOUNTER — Other Ambulatory Visit: Payer: Self-pay | Admitting: Family Medicine

## 2023-08-12 DIAGNOSIS — K2100 Gastro-esophageal reflux disease with esophagitis, without bleeding: Secondary | ICD-10-CM

## 2023-08-12 DIAGNOSIS — E1169 Type 2 diabetes mellitus with other specified complication: Secondary | ICD-10-CM

## 2023-08-31 ENCOUNTER — Emergency Department (HOSPITAL_COMMUNITY): Payer: 59

## 2023-08-31 ENCOUNTER — Other Ambulatory Visit: Payer: Self-pay

## 2023-08-31 ENCOUNTER — Emergency Department (HOSPITAL_COMMUNITY)
Admission: EM | Admit: 2023-08-31 | Discharge: 2023-08-31 | Disposition: A | Discharge status: Home / Self Care | Payer: 59 | Attending: Emergency Medicine | Admitting: Emergency Medicine

## 2023-08-31 ENCOUNTER — Encounter (HOSPITAL_COMMUNITY): Payer: Self-pay | Admitting: *Deleted

## 2023-08-31 DIAGNOSIS — Z79899 Other long term (current) drug therapy: Secondary | ICD-10-CM | POA: Insufficient documentation

## 2023-08-31 DIAGNOSIS — R42 Dizziness and giddiness: Secondary | ICD-10-CM | POA: Diagnosis not present

## 2023-08-31 DIAGNOSIS — S060X0A Concussion without loss of consciousness, initial encounter: Secondary | ICD-10-CM | POA: Diagnosis not present

## 2023-08-31 DIAGNOSIS — Z7984 Long term (current) use of oral hypoglycemic drugs: Secondary | ICD-10-CM | POA: Insufficient documentation

## 2023-08-31 DIAGNOSIS — S0990XA Unspecified injury of head, initial encounter: Secondary | ICD-10-CM | POA: Diagnosis not present

## 2023-08-31 DIAGNOSIS — E119 Type 2 diabetes mellitus without complications: Secondary | ICD-10-CM | POA: Insufficient documentation

## 2023-08-31 DIAGNOSIS — M25521 Pain in right elbow: Secondary | ICD-10-CM | POA: Diagnosis not present

## 2023-08-31 DIAGNOSIS — Z981 Arthrodesis status: Secondary | ICD-10-CM | POA: Diagnosis not present

## 2023-08-31 DIAGNOSIS — M5137 Other intervertebral disc degeneration, lumbosacral region: Secondary | ICD-10-CM | POA: Diagnosis not present

## 2023-08-31 DIAGNOSIS — E039 Hypothyroidism, unspecified: Secondary | ICD-10-CM | POA: Insufficient documentation

## 2023-08-31 DIAGNOSIS — I6522 Occlusion and stenosis of left carotid artery: Secondary | ICD-10-CM | POA: Diagnosis not present

## 2023-08-31 DIAGNOSIS — Z794 Long term (current) use of insulin: Secondary | ICD-10-CM | POA: Diagnosis not present

## 2023-08-31 DIAGNOSIS — W01198A Fall on same level from slipping, tripping and stumbling with subsequent striking against other object, initial encounter: Secondary | ICD-10-CM | POA: Insufficient documentation

## 2023-08-31 DIAGNOSIS — M545 Low back pain, unspecified: Secondary | ICD-10-CM | POA: Diagnosis not present

## 2023-08-31 DIAGNOSIS — W19XXXA Unspecified fall, initial encounter: Secondary | ICD-10-CM | POA: Diagnosis not present

## 2023-08-31 DIAGNOSIS — M549 Dorsalgia, unspecified: Secondary | ICD-10-CM | POA: Diagnosis not present

## 2023-08-31 DIAGNOSIS — M4807 Spinal stenosis, lumbosacral region: Secondary | ICD-10-CM | POA: Diagnosis not present

## 2023-08-31 DIAGNOSIS — Z743 Need for continuous supervision: Secondary | ICD-10-CM | POA: Diagnosis not present

## 2023-08-31 DIAGNOSIS — Z043 Encounter for examination and observation following other accident: Secondary | ICD-10-CM | POA: Diagnosis not present

## 2023-08-31 LAB — COMPREHENSIVE METABOLIC PANEL
ALT: 46 U/L — ABNORMAL HIGH (ref 0–44)
AST: 35 U/L (ref 15–41)
Albumin: 4 g/dL (ref 3.5–5.0)
Alkaline Phosphatase: 74 U/L (ref 38–126)
Anion gap: 8 (ref 5–15)
BUN: 14 mg/dL (ref 6–20)
CO2: 25 mmol/L (ref 22–32)
Calcium: 8.8 mg/dL — ABNORMAL LOW (ref 8.9–10.3)
Chloride: 104 mmol/L (ref 98–111)
Creatinine, Ser: 1.04 mg/dL (ref 0.61–1.24)
GFR, Estimated: >60 mL/min (ref >60)
Glucose, Bld: 88 mg/dL (ref 70–99)
Potassium: 4.2 mmol/L (ref 3.5–5.1)
Sodium: 137 mmol/L (ref 135–145)
Total Bilirubin: 0.6 mg/dL (ref 0.3–1.2)
Total Protein: 7 g/dL (ref 6.5–8.1)

## 2023-08-31 MED ORDER — IOHEXOL 350 MG/ML SOLN
75.0000 mL | Freq: Once | INTRAVENOUS | Status: AC | PRN
  Administered 2023-08-31: 75 mL via INTRAVENOUS

## 2023-08-31 NOTE — ED Notes (Signed)
IV access established, labs drawn Call bell on stretcher Pt waiting for CT

## 2023-08-31 NOTE — ED Provider Notes (Signed)
[place] EMERGENCY DEPARTMENT AT [facility] Provider Note   CSN: [id number] Arrival date & time: 08/31/23  7846     History {Add pertinent medical, surgical, social history, OB history to HPI:1} Chief Complaint  Patient presents with   Marvin Espinoza is a 51 y.o. male.   Fall Pertinent negatives include no chest pain, no abdominal pain, no headaches and no shortness of breath.       Marvin Espinoza is a 52 y.o. male with past medical history of GERD, hypothyroidism, type 2 diabetes, bipolar disorder, anxiety, who presents to the Emergency Department complaining of mechanical fall that occurred earlier today.  States he slipped in some water on the floor at [employer].  Describes falling backwards landing on his elbows and struck the back of his head onto the floor.  Since the fall, he describes having dizziness with movement with nausea and had 1 episode of vomiting prior to arrival.  He describes having a sensation of spinning with position change.  Dizziness subsides after standing.  He also states that he feels like his "body is leaning to the right."  His symptoms have been associated with "fluttering of my eyes."  He is also having pain of his lower back.  He endorses history of prior low back pain.  He denies any neck pain, LOC, chest pain or shortness of breath.  No numbness or weakness of his extremities.  No urine or bowel changes, right elbow pain greater than left    Home Medications Prior to Admission medications   Medication Sig Start Date End Date Taking? Authorizing Provider  ALPRAZolam Prudy Feeler) 1 MG tablet 1 mg. Six times per day prn 01/31/20   [provider]  Blood Glucose Monitoring Suppl (ONETOUCH VERIO) w/Device KIT Use to test blood sugar daily as directed 06/26/20   Raliegh Ip, DO  Calcium-Vitamin D-Vitamin K (SM CALCIUM SOFT CHEWS PO) Take 1 application by mouth 2 (two) times daily. Chew calcium tabs twice a day.  (bariatric  surgery)    [provider]  docusate sodium (COLACE) 100 MG capsule Take 100 mg by mouth 2 (two) times daily.    [provider]  famotidine (PEPCID) 20 MG tablet TAKE ONE (1) TABLET BY MOUTH TWO (2) TIMES DAILY 08/12/23   Delynn Flavin M, DO  FLUoxetine (PROZAC) 20 MG tablet Take 20 mg by mouth daily.    [provider]  gabapentin (NEURONTIN) 300 MG capsule TAKE 1 CAPSULE BY MOUTH THREE TIMES A DAY 12/23/22   Delynn Flavin M, DO  glucose blood test strip test blood sugar 2x daily Dx E11.9 03/08/22   Delynn Flavin M, DO  hydrOXYzine (VISTARIL) 25 MG capsule Take 25 mg by mouth 3 (three) times daily. 09/16/22   [provider]  lamoTRIgine (LAMICTAL) 200 MG tablet Take 400 mg by mouth at bedtime.     [provider]  levothyroxine (SYNTHROID) 125 MCG tablet Take 125 mcg by mouth daily before breakfast.    [provider]  Magnesium Gluconate (MAGNESIUM 27 PO) Take by mouth.    [provider]  metFORMIN (GLUCOPHAGE) 1000 MG tablet Take 1 tablet (1,000 mg total) by mouth 2 (two) times daily with a meal. 12/23/22   Raliegh Ip, DO  Multiple Vitamin (MULTIVITAMIN WITH MINERALS) TABS tablet Take 1 tablet by mouth 2 (two) times daily. For Vitamin supplementation Patient taking differently: Take 1 tablet by mouth 2 (two) times daily. For Vitamin supplementation FROM  [facility] 05/15/17   Armandina Stammer I, NP  OLANZapine (ZYPREXA) 2.5 MG tablet Take 2.5 mg by mouth at bedtime.    [provider]  OneTouch Delica Lancets 30G MISC Use to test blood sugar daily as directed 06/26/20   Delynn Flavin M, DO  pantoprazole (PROTONIX) 40 MG tablet Take 1 tablet (40 mg total) by mouth 2 (two) times daily. 12/23/22   Raliegh Ip, DO  promethazine (PHENERGAN) 12.5 MG tablet TAKE 1 TO 2 TABLETS EVERY 8 HOURS AS NEEDED FOR NAUSEA AND VOMITING 08/12/23   Delynn Flavin M, DO  psyllium (METAMUCIL) 58.6 % powder Take 1 packet  by mouth 2 (two) times daily.    [provider]  rosuvastatin (CRESTOR) 5 MG tablet TAKE ONE (1) TABLET BY MOUTH EVERY DAY 12/23/22   Gottschalk, Ashly M, DO  SOLIQUA 100-33 UNT-MCG/ML SOPN INJECT 40-60 UNITS IN TO THE SKIN IN [facility] 08/12/23   Gottschalk, Ashly M, DO  STEGLATRO 15 MG TABS tablet TAKE 1 TABLET BY MOUTH EVERY MORNING BEFORE BREAKFAST 08/08/22   Gottschalk, Ashly M, DO  tiZANidine (ZANAFLEX) 4 MG tablet TAKE 1 TABLET BY MOUTH EVERY 8 HOURS AS NEEDED FOR MUSCLE SPASMS Patient not taking: Reported on 07/17/2023 06/27/23   Raliegh Ip, DO  vitamin B-12 (CYANOCOBALAMIN) 250 MCG tablet Take 1 tablet (250 mcg total) by mouth daily. For low B-12 supplementation 01/29/20   Armandina Stammer I, NP      Allergies    Abilify [aripiprazole], Caplyta [lumateperone], Seroquel [quetiapine fumarate], Alcohol, Ambien [zolpidem], Anacin-3 [acetaminophen], Cariprazine, Cariprazine hcl, Latuda [lurasidone hcl], Lurasidone, Prozac [fluoxetine], Victoza [liraglutide], Bupropion, Hydrocodone-acetaminophen, Risperidone, and Tegretol [carbamazepine]    Review of Systems   Review of Systems  Constitutional:  Negative for fever.  Eyes:  Negative for visual disturbance.       "Fluttering of both eyes"  Respiratory:  Negative for shortness of breath.   Cardiovascular:  Negative for chest pain.  Gastrointestinal:  Positive for nausea and vomiting. Negative for abdominal pain and diarrhea.  Genitourinary:  Negative for difficulty urinating and hematuria.  Musculoskeletal:  Positive for arthralgias (Bilateral elbow pain right greater than left) and back pain. Negative for neck pain.  Skin:  Negative for wound.  Neurological:  Positive for dizziness. Negative for syncope, weakness, numbness and headaches.    Physical Exam Updated Vital Signs BP (!) 134/96 (BP Location: Left Arm)   Pulse 68   Temp 98.6 F (37 C) (Oral)   Resp 18   Ht 5\' 10"  (1.778 m)   Wt 115.7 kg   SpO2 98%   BMI 36.59  kg/m  Physical Exam Vitals and nursing note reviewed.  Constitutional:      General: He is not in acute distress.    Appearance: Normal appearance. He is not toxic-appearing.  HENT:     Mouth/Throat:     Mouth: Mucous membranes are moist.     Pharynx: Oropharynx is clear.  Eyes:     Extraocular Movements: Extraocular movements intact.     Right eye: Nystagmus present.     Left eye: Nystagmus present.     Conjunctiva/sclera: Conjunctivae normal.     Pupils: Pupils are equal, round, and reactive to light.     Comments: Mild bilateral nystagmus noted.  Neck:     Trachea: Phonation normal.  Cardiovascular:     Rate and Rhythm: Normal rate and regular rhythm.     Pulses: Normal pulses.  Pulmonary:     Effort: Pulmonary effort  is normal.  Chest:     Chest wall: No tenderness.  Abdominal:     Palpations: Abdomen is soft.     Tenderness: There is no abdominal tenderness.  Musculoskeletal:        General: Tenderness and signs of injury present. No swelling or deformity.     Right elbow: No swelling, deformity or effusion. Normal range of motion. Tenderness present in olecranon process.     Left elbow: No swelling, deformity or effusion. Normal range of motion.     Cervical back: Full passive range of motion without pain and normal range of motion. No tenderness. No spinous process tenderness or muscular tenderness.     Comments: Mild tenderness at the olecranon process on range of motion.  No bony deformity.  No edema.  Mild midline tenderness of the lower lumbar spine.  No bony step-offs.  Negative bilateral straight leg raise, patient has full range of motion of bilateral hips without pain  Skin:    General: Skin is warm.     Capillary Refill: Capillary refill takes less than 2 seconds.  Neurological:     Mental Status: He is alert.     GCS: GCS eye subscore is 4. GCS verbal subscore is 5. GCS motor subscore is 6.     Sensory: Sensation is intact. No sensory deficit.     Motor:  Motor function is intact. No weakness.     Coordination: Coordination normal.     Gait: Gait normal.     Comments: CN II through XII grossly intact.  Speech clear.  Patient is ambulated in the department and gait is steady.     ED Results / Procedures / Treatments   Labs (all labs ordered are listed, but only abnormal results are displayed) Labs Reviewed  COMPREHENSIVE METABOLIC PANEL - Abnormal; Notable for the following components:      Result Value   Calcium 8.8 (*)    ALT 46 (*)    All other components within normal limits    EKG None  Radiology CT Head Wo Contrast  Result Date: 08/31/2023 CLINICAL DATA:  Fall in [employer] bathroom as fell backwards hitting back of head and elbows with subsequent nausea and dizziness. EXAM: CT HEAD WITHOUT CONTRAST CT CERVICAL SPINE WITHOUT CONTRAST TECHNIQUE: Multidetector CT imaging of the head and cervical spine was performed following the standard protocol without intravenous contrast. Multiplanar CT image reconstructions of the cervical spine were also generated. RADIATION DOSE REDUCTION: This exam was performed according to the departmental dose-optimization program which includes automated exposure control, adjustment of the mA and/or kV according to patient size and/or use of iterative reconstruction technique. COMPARISON:  01/25/2020 FINDINGS: CT HEAD FINDINGS Brain: No evidence of acute infarction, hemorrhage, hydrocephalus, extra-axial collection or mass lesion/mass effect. Vascular: No hyperdense vessel or unexpected calcification. Skull: Normal. Negative for fracture or focal lesion. Sinuses/Orbits: No acute finding. Other: None. CT CERVICAL SPINE FINDINGS Alignment: Normal. Skull base and vertebrae: No acute fracture. No primary bone lesion or focal pathologic process. Vertebral heights are preserved. 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 Mild left-sided neural foraminal narrowing at the C3-4 and C4-5 levels. Soft tissues and spinal  canal: No prevertebral fluid or swelling. No visible canal hematoma. Disc levels:  Normal. Upper chest: No acute findings. Other: None. IMPRESSION: 1. No acute brain injury. 2. No acute cervical spine injury. 3. Mild left-sided neural foraminal narrowing at the C3-4 and C4-5 levels. Electronically Signed   By: Elberta Fortis  M.D.   On: 08/31/2023 10:31   CT Cervical Spine Wo Contrast  Result Date: 08/31/2023 CLINICAL DATA:  Fall in [employer] bathroom as fell backwards hitting back of head and elbows with subsequent nausea and dizziness. EXAM: CT HEAD WITHOUT CONTRAST CT CERVICAL SPINE WITHOUT CONTRAST TECHNIQUE: Multidetector CT imaging of the head and cervical spine was performed following the standard protocol without intravenous contrast. Multiplanar CT image reconstructions of the cervical spine were also generated. RADIATION DOSE REDUCTION: This exam was performed according to the departmental dose-optimization program which includes automated exposure control, adjustment of the mA and/or kV according to patient size and/or use of iterative reconstruction technique. COMPARISON:  01/25/2020 FINDINGS: CT HEAD FINDINGS Brain: No evidence of acute infarction, hemorrhage, hydrocephalus, extra-axial collection or mass lesion/mass effect. Vascular: No hyperdense vessel or unexpected calcification. Skull: Normal. Negative for fracture or focal lesion. Sinuses/Orbits: No acute finding. Other: None. CT CERVICAL SPINE FINDINGS Alignment: Normal. Skull base and vertebrae: No acute fracture. No primary bone lesion or focal pathologic process. Vertebral heights are preserved. 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 Mild left-sided neural foraminal narrowing at the C3-4 and C4-5 levels. Soft tissues and spinal canal: No prevertebral fluid or swelling. No visible canal hematoma. Disc levels:  Normal. Upper chest: No acute findings. Other: None. IMPRESSION: 1. No acute brain injury. 2. No acute cervical spine injury.  3. Mild left-sided neural foraminal narrowing at the C3-4 and C4-5 levels. Electronically Signed   By: Elberta Fortis M.D.   On: 08/31/2023 10:31   CT Lumbar Spine Wo Contrast  Result Date: 08/31/2023 CLINICAL DATA:  Slipped in a bathroom hitting the back of his head and both elbows. Also having some low back pain. EXAM: CT LUMBAR SPINE WITHOUT CONTRAST TECHNIQUE: Multidetector CT imaging of the lumbar spine was performed without intravenous contrast administration. Multiplanar CT image reconstructions were also generated. RADIATION DOSE REDUCTION: This exam was performed according to the departmental dose-optimization program which includes automated exposure control, adjustment of the mA and/or kV according to patient size and/or use of iterative reconstruction technique. COMPARISON:  Abdomen and pelvis CT, 10/27/2020. FINDINGS: Segmentation: 5 lumbar type vertebrae. Alignment: Normal. Vertebrae: No acute fracture or focal pathologic process. Paraspinal and other soft tissues: Unremarkable. Disc levels: T11-T12: Mild to moderate loss of disc height. Endplate irregularity. Mild disc bulging. No significant stenosis. T12-L1 through L3-L4: Normal. L4-L5: Mild loss of disc height. No significant disc bulging. No disc herniation. No significant stenosis. L5-S1: Mild loss of disc height: Mild disc bulging. Eccentric disc bulging versus a chronic right foraminal disc protrusion with associated endplate spurring encroaches upon the right lateral recess and causes moderate right neural foraminal narrowing. IMPRESSION: 1. No fracture or acute finding.  No malalignment. 2. 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 Electronically Signed   By: Amie Portland M.D.   On: 08/31/2023 10:30   DG Elbow Complete Right  Result Date: 08/31/2023 CLINICAL DATA:  Fall, pain EXAM: RIGHT ELBOW - COMPLETE 4 VIEW  COMPARISON:  None Available. FINDINGS: There is no evidence of fracture, dislocation, or joint effusion. There is no evidence of arthropathy or other focal bone abnormality. Soft tissues are unremarkable. IMPRESSION: Negative. Electronically Signed   By: Layla Maw M.D.   On: 08/31/2023 10:29    Procedures Procedures  {Document cardiac monitor, telemetry assessment procedure when appropriate:1}  Medications Ordered in ED Medications  iohexol (OMNIPAQUE)  350 MG/ML injection 75 mL (75 mLs Intravenous Contrast Given 08/31/23 1247)    ED Course/ Medical Decision Making/ A&P   {   Click here for ABCD2, HEART and other calculatorsREFRESH Note before signing :1}                              Medical Decision Making Amount and/or Complexity of Data Reviewed Labs: ordered. Radiology: ordered.  Risk Prescription drug management.   ***  {Document critical care time when appropriate:1} {Document review of labs and clinical decision tools ie heart score, Chads2Vasc2 etc:1}  {Document your independent review of radiology images, and any outside records:1} {Document your discussion with family members, caretakers, and with consultants:1} {Document social determinants of health affecting pt's care:1} {Document your decision making why or why not admission, treatments were needed:1} Final Clinical Impression(s) / ED Diagnoses Final diagnoses:  None    Rx / DC Orders ED Discharge Orders     None

## 2023-08-31 NOTE — ED Triage Notes (Signed)
Pt pt BIB [facility] for c/o fall while in [employer] bathroom after slipping on water  Pt states he fell backwards hitting the back of his head and bilateral elbow  Pt c/o feeling nauseous and dizzy and having some lower back pain

## 2023-08-31 NOTE — ED Notes (Signed)
Introduced self to pt and ambulated pt Pt sitting on the side of the stretcher.  Pt able to stand on own power and walk at least 50 feet without difficulty.  Pt denies dizziness standing and walking Pt complains of RIGHT shoulder pain, stated he has had prior sx of shoulder.   Pt able to walk back to ED room and sit on stretcher Pt stated that the room starts to spin when he si lying flat. Pt stated that when he goes from laying to sitting position he feels like his eyes are moving back and forth.   Attached to partial monitor PA at bedside.  Vitals listed

## 2023-08-31 NOTE — Discharge Instructions (Signed)
Your workup today overall was reassuring.  I do feel that you have a concussion.  I recommend no driving or operating heavy machinery or making critical decisions for at least 1 week.  You may have off-and-on symptoms of headaches, brain fog, intermittent dizziness as these are can be symptoms of a concussion.  I recommend that you follow-up with your primary care provider later this week for recheck.  Return to the emergency department for any new or worsening symptoms.

## 2023-09-03 ENCOUNTER — Encounter: Payer: Self-pay | Admitting: Family Medicine

## 2023-09-03 ENCOUNTER — Ambulatory Visit (INDEPENDENT_AMBULATORY_CARE_PROVIDER_SITE_OTHER): Payer: 59 | Admitting: Family Medicine

## 2023-09-03 VITALS — BP 118/76 | HR 77 | Temp 98.0°F | Ht 70.0 in | Wt 253.0 lb

## 2023-09-03 DIAGNOSIS — Z23 Encounter for immunization: Secondary | ICD-10-CM

## 2023-09-03 DIAGNOSIS — W19XXXD Unspecified fall, subsequent encounter: Secondary | ICD-10-CM

## 2023-09-03 DIAGNOSIS — S060X0D Concussion without loss of consciousness, subsequent encounter: Secondary | ICD-10-CM

## 2023-09-03 DIAGNOSIS — Z87820 Personal history of traumatic brain injury: Secondary | ICD-10-CM

## 2023-09-03 MED ORDER — MECLIZINE HCL 25 MG PO TABS: 25.0000 mg | ORAL_TABLET | Freq: Three times a day (TID) | ORAL | 0 refills | Status: DC | PRN

## 2023-09-03 MED ORDER — ONDANSETRON 4 MG PO TBDP
4.0000 mg | ORAL_TABLET | Freq: Three times a day (TID) | ORAL | 0 refills | Status: AC | PRN
Start: 2023-09-03 — End: ?

## 2023-09-03 NOTE — Progress Notes (Signed)
Subjective:  Patient ID: Marvin Espinoza, male    DOB: [birth year], 52 y.o.   MRN: [id number]  Patient Care Team: Raliegh Ip, DO as PCP - General (Family Medicine) Marlis Edelson, MD as Referring Physician (General Surgery)   Chief Complaint:  Follow-up (concussion)  HPI: Marvin Espinoza is a 52 y.o. male presenting on 09/03/2023 for Follow-up (concussion)  HPI Larey Seat Sunday morning at [facility] and received concussion  Presented to the ED at [facility] on 08/31/2023 Completed head and neck imaging, which was reassuring. However, he continues to have "eye jumping" and dizziness. Reports Brain fog. States that the room is still spinning and he loses his balance often. Reports that it is worse with position changes, especially lying down.  Endorses ear fullness. Denies ringing. States that sounds seem quieter/muffled.  He is watching some TV but is taking a lot of naps. He is not on his phone often.  Denies seizures, LOC.  Endorses headaches - feels that today it is a little worse than yesterday.  Nausea was getting better worsened today  Has zofran, 1-2 times per day.  Patient is continuing to vomit once per day  Diarrhea Sunday-Monday, believes that this was from contrast in CT  Denies incontinence  Denies weakness to one side.   Relevant past medical, surgical, family, and social history reviewed and updated as indicated.  Allergies and medications reviewed and updated. Data reviewed: Chart in Epic.   Past Medical History:  Diagnosis Date   Anxiety    Bipolar disorder (HCC)    CTS (carpal tunnel syndrome)    Depression    Diabetes mellitus without complication (HCC)    GERD (gastroesophageal reflux disease)    H/O bariatric surgery 2016   Head injury 05/06/2017   Hypothyroidism    Hypothyroidism    Sleep apnea    no longer with OSA d/t over 100 pd weight loss    Past Surgical History:  Procedure Laterality Date   ANAL FISSURE REPAIR     BARIATRIC  SURGERY     CARPAL TUNNEL RELEASE Left 05/09/2015   Procedure: LEFT CARPAL TUNNEL RELEASE;  Surgeon: Cindee Salt, MD;  Location: [facility];  Service: Orthopedics;  Laterality: Left;   CARPAL TUNNEL RELEASE Left 05-09-2015   CARPAL TUNNEL RELEASE Right 06/20/2015   Procedure: RIGHT CARPAL TUNNEL RELEASE;  Surgeon: Cindee Salt, MD;  Location:  SURGERY CENTER;  Service: Orthopedics;  Laterality: Right;  REGIONAL/FAB   CHOLECYSTECTOMY     COLONOSCOPY     UPPER GASTROINTESTINAL ENDOSCOPY      Social History   Socioeconomic History   Marital status: Married    Spouse name: Vernona Rieger   Number of children: 1   Years of education: Not on file   Highest education level: 12th grade  Occupational History   Not on file  Tobacco Use   Smoking status: Never   Smokeless tobacco: Never  Vaping Use   Vaping status: Never Used  Substance and Sexual Activity   Alcohol use: No    Comment: severe allergy to alcohol!!   Drug use: No   Sexual activity: Yes  Other Topics Concern   Not on file  Social History Narrative   Lives with Wife, and one child   Two story   He stays there to keep an eye on the house.    His son comes to visit also.   Right handed.   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  Social Determinants of Health   Financial Resource Strain: Medium Risk (09/02/2023)   Overall Financial Resource Strain (CARDIA)    Difficulty of Paying Living Expenses: Somewhat hard  Food Insecurity: No Food Insecurity (09/02/2023)   Hunger Vital Sign    Worried About Running Out of Food in the Last Year: Never true    Ran Out of Food in the Last Year: Never true  Transportation Needs: No Transportation Needs (09/02/2023)   PRAPARE - Administrator, Civil Service (Medical): No    Lack of Transportation (Non-Medical): No  Physical Activity: Insufficiently Active (09/02/2023)   Exercise Vital Sign    Days of Exercise per Week: 2 days    Minutes of Exercise per  Session: 40 min  Stress: Stress Concern Present (09/02/2023)   [employer] of Occupational Health - Occupational Stress Questionnaire    Feeling of Stress : Rather much  Social Connections: Moderately Isolated (09/02/2023)   Social Connection and Isolation Panel [NHANES]    Frequency of Communication with Friends and Family: Once a week    Frequency of Social Gatherings with Friends and Family: Never    Attends Religious Services: 1 to 4 times per year    Active Member of [employer] or Organizations: No    Attends Banker Meetings: Not on file    Marital Status: Married  Intimate Partner Violence: Not on file    Outpatient Encounter Medications as of 09/03/2023  Medication Sig   ALPRAZolam (XANAX) 1 MG tablet 1 mg. Six times per day prn   Blood Glucose Monitoring Suppl (ONETOUCH VERIO) w/Device KIT Use to test blood sugar daily as directed   calcium carbonate (OS-CAL) 1250 (500 Ca) MG chewable tablet Chew 1 tablet by mouth daily.   Calcium-Vitamin D-Vitamin K (SM CALCIUM SOFT CHEWS PO) Take 1 application by mouth 2 (two) times daily. Chew calcium tabs twice a day.  (bariatric surgery)   docusate sodium (COLACE) 100 MG capsule Take 100 mg by mouth 2 (two) times daily.   famotidine (PEPCID) 20 MG tablet TAKE ONE (1) TABLET BY MOUTH TWO (2) TIMES DAILY   Ferrous Sulfate 50 MG TBCR Take by mouth.   FLUoxetine (PROZAC) 20 MG tablet Take 20 mg by mouth daily.   gabapentin (NEURONTIN) 300 MG capsule TAKE 1 CAPSULE BY MOUTH THREE TIMES A DAY   glucose blood test strip test blood sugar 2x daily Dx E11.9   hydrOXYzine (VISTARIL) 25 MG capsule Take 25 mg by mouth 3 (three) times daily.   lamoTRIgine (LAMICTAL) 200 MG tablet Take 400 mg by mouth at bedtime.    levothyroxine (SYNTHROID) 125 MCG tablet Take 125 mcg by mouth daily before breakfast.   Magnesium Gluconate (MAGNESIUM 27 PO) Take by mouth.   metFORMIN (GLUCOPHAGE) 1000 MG tablet Take 1 tablet (1,000 mg total) by mouth 2 (two)  times daily with a meal.   Multiple Vitamin (MULTIVITAMIN WITH MINERALS) TABS tablet Take 1 tablet by mouth 2 (two) times daily. For Vitamin supplementation (Patient taking differently: Take 1 tablet by mouth 2 (two) times daily. For Vitamin supplementation FROM GASTRIC BYPASS)   OLANZapine (ZYPREXA) 2.5 MG tablet Take 2.5 mg by mouth at bedtime.   OneTouch Delica Lancets 30G MISC Use to test blood sugar daily as directed   pantoprazole (PROTONIX) 40 MG tablet Take 1 tablet (40 mg total) by mouth 2 (two) times daily.   promethazine (PHENERGAN) 12.5 MG tablet TAKE 1 TO 2 TABLETS EVERY 8 HOURS AS NEEDED FOR NAUSEA  AND VOMITING   psyllium (METAMUCIL) 58.6 % powder Take 1 packet by mouth 2 (two) times daily.   rosuvastatin (CRESTOR) 5 MG tablet TAKE ONE (1) TABLET BY MOUTH EVERY DAY   SOLIQUA 100-33 UNT-MCG/ML SOPN INJECT 40-60 UNITS IN TO THE SKIN IN THEMORNING   STEGLATRO 15 MG TABS tablet TAKE 1 TABLET BY MOUTH EVERY MORNING BEFORE BREAKFAST   tiZANidine (ZANAFLEX) 4 MG tablet TAKE 1 TABLET BY MOUTH EVERY 8 HOURS AS NEEDED FOR MUSCLE SPASMS   vitamin B-12 (CYANOCOBALAMIN) 250 MCG tablet Take 1 tablet (250 mcg total) by mouth daily. For low B-12 supplementation   No facility-administered encounter medications on file as of 09/03/2023.    Allergies  Allergen Reactions   Abilify [Aripiprazole] Anaphylaxis and Swelling    "slurred speech" FACIAL DROOPING, TONGUE SWELLING, TARDIVE DYSKENESIA    Caplyta [Lumateperone] Anaphylaxis   Pregabalin Shortness Of Breath   Seroquel [Quetiapine Fumarate]     tardive dyskinesia   Alcohol Itching    Drinking alcohol   Ambien [Zolpidem] Other (See Comments)    NIGHT TERRORS   Anacin-3 [Acetaminophen] Itching    REGULAR TYLENOL IS OKAY.  NEEDS BENADRYL TO TAKE THIS   Cariprazine Other (See Comments)    Tardive dyskenesia   Cariprazine Hcl Itching   Latuda [Lurasidone Hcl] Other (See Comments)    RUNS SUGAR TOO HIGH   Lurasidone Other (See Comments)     TARDIVE DYSKINESIA   Prozac [Fluoxetine] Itching   Victoza [Liraglutide] Other (See Comments)    Severe heartburn    Hydrocodone Other (See Comments)   Bupropion Rash   Hydrocodone-Acetaminophen Itching   Risperidone Rash   Tegretol [Carbamazepine] Rash    Review of Systems As per HPI  Objective:  BP 118/76   Pulse 77   Temp 98 F (36.7 C)   Ht 5\' 10"  (1.778 m)   Wt 253 lb (114.8 kg)   SpO2 96%   BMI 36.30 kg/m    Wt Readings from Last 3 Encounters:  09/03/23 253 lb (114.8 kg)  08/31/23 255 lb (115.7 kg)  06/27/23 252 lb 3.2 oz (114.4 kg)    Physical Exam Constitutional:      General: He is awake. He is not in acute distress.    Appearance: Normal appearance. He is well-developed and well-groomed. He is not ill-appearing, toxic-appearing or diaphoretic.  Cardiovascular:     Rate and Rhythm: Normal rate.     Pulses: Normal pulses.          Radial pulses are 2+ on the right side and 2+ on the left side.       Posterior tibial pulses are 2+ on the right side and 2+ on the left side.     Heart sounds: Normal heart sounds. No murmur heard.    No gallop.  Pulmonary:     Effort: Pulmonary effort is normal. No respiratory distress.     Breath sounds: Normal breath sounds. No stridor. No wheezing, rhonchi or rales.  Musculoskeletal:     Cervical back: Full passive range of motion without pain and neck supple.     Right lower leg: No edema.     Left lower leg: No edema.  Skin:    General: Skin is warm.     Capillary Refill: Capillary refill takes less than 2 seconds.  Neurological:     General: No focal deficit present.     Mental Status: He is alert, oriented to person, place, and time and easily aroused. Mental  status is at baseline.     GCS: GCS eye subscore is 4. GCS verbal subscore is 5. GCS motor subscore is 6.     Sensory: Sensation is intact.     Motor: No weakness, tremor, atrophy, abnormal muscle tone, seizure activity or pronator drift.     Coordination:  Romberg sign positive. Coordination abnormal.     Gait: Gait abnormal.  Psychiatric:        Attention and Perception: Attention and perception normal.        Mood and Affect: Mood normal. Affect is flat.        Speech: Speech normal.        Behavior: Behavior normal. Behavior is cooperative.        Thought Content: Thought content normal. Thought content does not include homicidal or suicidal ideation. Thought content does not include homicidal or suicidal plan.        Cognition and Memory: Cognition and memory normal.        Judgment: Judgment normal.    Results for orders placed or performed during the hospital encounter of 08/31/23  Comprehensive metabolic panel  Result Value Ref Range   Sodium 137 135 - 145 mmol/L   Potassium 4.2 3.5 - 5.1 mmol/L   Chloride 104 98 - 111 mmol/L   CO2 25 22 - 32 mmol/L   Glucose, Bld 88 70 - 99 mg/dL   BUN 14 6 - 20 mg/dL   Creatinine, Ser 1.82 0.61 - 1.24 mg/dL   Calcium 8.8 (L) 8.9 - 10.3 mg/dL   Total Protein 7.0 6.5 - 8.1 g/dL   Albumin 4.0 3.5 - 5.0 g/dL   AST 35 15 - 41 U/L   ALT 46 (H) 0 - 44 U/L   Alkaline Phosphatase 74 38 - 126 U/L   Total Bilirubin 0.6 0.3 - 1.2 mg/dL   GFR, Estimated >99 >37 mL/min   Anion gap 8 5 - 15       09/03/2023    2:28 PM 06/27/2023    8:25 AM 06/27/2023    8:09 AM 12/23/2022    8:11 AM 08/21/2022    8:12 AM  Depression screen PHQ 2/9  Decreased Interest 1 3 0 2 2  Down, Depressed, Hopeless 2 2 0 2 2  PHQ - 2 Score 3 5 0 4 4  Altered sleeping 2 3  3 3   Tired, decreased energy 1 3  3 1   Change in appetite 3 3  3 2   Feeling bad or failure about yourself  3 2  3 2   Trouble concentrating 3 3  3 3   Moving slowly or fidgety/restless 3 2  1 3   Suicidal thoughts 0 0  1 1  PHQ-9 Score 18 21  21 19   Difficult doing work/chores Somewhat difficult Very difficult  Extremely dIfficult Not difficult at all       09/03/2023    2:29 PM 06/27/2023    8:30 AM 12/23/2022    8:12 AM 08/21/2022    8:13 AM  GAD 7 :  Generalized Anxiety Score  Nervous, Anxious, on Edge 2 3 3 3   Control/stop worrying 3 3 3 3   Worry too much - different things 3 3 3 3   Trouble relaxing 2 3 3 3   Restless 1 2 2 3   Easily annoyed or irritable 3 3 3 3   Afraid - awful might happen 0 1 0 1  Total GAD 7 Score 14 18 17 19   Anxiety Difficulty  Extremely difficult Extremely difficult    Pertinent labs & imaging results that were available during my care of the patient were reviewed by me and considered in my medical decision making.  Assessment & Plan:  Lucy Sacre" was seen today for follow-up.  Diagnoses and all orders for this visit:  Fall, subsequent encounter Reviewed ED note from 08/31/23 from [place], [place].  Reviewed imaging as below.  Discussed red flag symptoms and when to return to the ED. Discussed etiology and prognosis of concussion. Provided medications as below to assist with symptoms. Patient has caregiver to continue to monitor. Reinforced reducing eye strain and concussion precautions. Will have close follow up to ensure improvement.   History of concussion As above.  -     ondansetron (ZOFRAN-ODT) 4 MG disintegrating tablet; Take 1-2 tablets (4-8 mg total) by mouth every 8 (eight) hours as needed for nausea or vomiting. -     meclizine (ANTIVERT) 25 MG tablet; Take 1 tablet (25 mg total) by mouth 3 (three) times daily as needed for dizziness.  CT ANGIO HEAD NECK W WO CM  IMPRESSION: Normal CTA of the head and neck.     Electronically Signed   By: Lesia Hausen M.D.   On: 08/31/2023 13:23  CT Head Wo Contrast  IMPRESSION: 1. No acute brain injury. 2. No acute cervical spine injury. 3. Mild left-sided neural foraminal narrowing at the C3-4 and C4-5 levels.     Electronically Signed   By: Elberta Fortis M.D.   On: 08/31/2023 10:31  Continue all other maintenance medications.  Instructed patient to hold hydroxyzine while takin meclizine   Follow up plan: Return in about 1 week (around  09/10/2023) for concussion follow up .  Continue healthy lifestyle choices, including diet (rich in fruits, vegetables, and lean proteins, and low in salt and simple carbohydrates) and exercise (at least 30 minutes of moderate physical activity daily).  Written and verbal instructions provided   The above assessment and management plan was discussed with the patient. The patient verbalized understanding of and has agreed to the management plan. Patient is aware to call the clinic if they develop any new symptoms or if symptoms persist or worsen. Patient is aware when to return to the clinic for a follow-up visit. Patient educated on when it is appropriate to go to the emergency department.   Neale Burly, DNP-FNP [facility] [address] [place], [place] [address] [phone]

## 2023-09-03 NOTE — Patient Instructions (Signed)
The following warning signs should prompt the you to seek immediate medical help:  ?Inability to awaken the patient at time of expected wakening  ?Severe or worsening headaches  ?Somnolence or confusion  ?Restlessness, unsteadiness, or seizures  ?Difficulties with vision  ?Vomiting, fever, or stiff neck  ?Urinary or bowel incontinence  ?Weakness or numbness involving any part of the body

## 2023-09-08 ENCOUNTER — Encounter: Payer: Self-pay | Admitting: *Deleted

## 2023-09-08 ENCOUNTER — Telehealth: Payer: Self-pay | Admitting: *Deleted

## 2023-09-08 ENCOUNTER — Ambulatory Visit: Payer: Self-pay | Admitting: *Deleted

## 2023-09-08 NOTE — Patient Outreach (Signed)
Care Coordination   Initial Visit Note   09/08/2023  Name: Marvin Espinoza MRN: [id number] DOB: 1971-02-28  Marvin Espinoza is a 52 y.o. year old male who sees Marvin Ip, DO for primary care. I spoke with Marvin Espinoza by phone today.  What matters to the patients health and wellness today?  Reduce & Manage Symptoms of Anxiety & Depression.    Goals Addressed               This Visit's Progress     Reduce & Manage Symptoms of Anxiety & Depression. (pt-stated)   On track     Care Coordination Interventions:  Interventions Today    Flowsheet Row Most Recent Value  Chronic Disease   Chronic disease during today's visit Diabetes, Other  [Adjustment Disorder with Anxious Mood, Adjustment Insomnia, Bipolar Affective Psychosis, Drug Overdose, Generalized Anxiety Disorder, History of Concussion, Major Depressive Disorder, Recurrent Severe, without Psychosis, Mood Disorder, PTSD, Neuropathy]  General Interventions   General Interventions Discussed/Reviewed General Interventions Discussed, Labs, Vaccines, Doctor Visits, Referral to Nurse, Communication with, Level of Care, [facility], Health Screening, Annual Foot Exam, Lipid Profile, General Interventions Reviewed, Horticulturist, commercial (DME), Annual Eye Exam  [Encouraged]  Labs Hgb A1c every 3 months, Kidney Function  [Encouraged]  Vaccines COVID-19, Flu, Pneumonia, RSV, Shingles, Tetanus/Pertussis/Diphtheria  [Encouraged]  Doctor Visits Discussed/Reviewed Doctor Visits Discussed, Specialist, Doctor Visits Reviewed, Annual Wellness Visits, PCP  [Encouraged]  Health Screening Bone Density, Colonoscopy, Prostate  [Encouraged]  Durable Medical Equipment (DME) Glucomoter  [Encouraged]  PCP/Specialist Visits Compliance with follow-up visit  [Encouraged]  Communication with PCP/Specialists, RN, Pharmacists, Social Work  [Encouraged]  Level of Care Applications  [Encouraged]  Ship broker, Personal Care Services,  FL-2  [Encouraged]  Exercise Interventions   Exercise Discussed/Reviewed Exercise Discussed, Assistive device use and maintanence, Weight Managment, Physical Activity, Exercise Reviewed  [Encouraged]  Physical Activity Discussed/Reviewed Physical Activity Discussed, Home Exercise Program (HEP), PREP, Gym, Physical Activity Reviewed, Types of exercise  [Encouraged]  Weight Management Weight loss  [Encouraged]  Education Interventions   Education Provided Provided Therapist, sports, Provided Web-based Education, Provided Education  [Encouraged]  Provided Verbal Education On Nutrition, Mental Health/Coping with Illness, Foot Care, Eye Care, Applications, Labs, Blood Sugar Monitoring, Exercise, Medication, Development worker, community, [employer], When to see the doctor  [Encouraged]  Labs Reviewed --  [N/A]  Applications Medicaid, Personal Care Services, [place]-2  [Encouraged]  Mental Health Interventions   Mental Health Discussed/Reviewed Mental Health Discussed, Mental Health Reviewed, Coping Strategies, Crisis, Other, Suicide, Substance Abuse, Grief and Loss, Depression, Anxiety  [Domestic Violence]  Nutrition Interventions   Nutrition Discussed/Reviewed Nutrition Discussed, Adding fruits and vegetables, Increasing proteins, Decreasing fats, Decreasing salt, Fluid intake, Nutrition Reviewed, Carbohydrate meal planning, Portion sizes, Decreasing sugar intake  [Encouraged]  Pharmacy Interventions   Pharmacy Dicussed/Reviewed Pharmacy Topics Discussed, Medications and their functions, Referral to Pharmacist, Medication Adherence, Pharmacy Topics Reviewed, Affording Medications  [Encouraged]  Medication Adherence --  [N/A]  Referral to Pharmacist Drug interaction/side effects  [Allergic to Psychotropic Medications]  Safety Interventions   Safety Discussed/Reviewed Safety Discussed, Safety Reviewed, Fall Risk, Home Safety  [Encouraged]  Home Safety Assistive Devices, Refer for community resources   [Encouraged]  Advanced Directive Interventions   Advanced Directives Discussed/Reviewed Advanced Directives Discussed  [Encouraged]      Assessed Social Determinant of Health Barriers. Discussed Plans for Ongoing Care Management Follow Up. Provided Careers information officer Information for Care Management Team Members. Screened for Signs & Symptoms of Depression, Related to Chronic  Disease State.  PHQ2 & PHQ9 Depression Screen Completed & Results Reviewed.  Suicidal Ideation & Homicidal Ideation Assessed - None Present.   Domestic Violence Assessed - None Present. Access to Weapons Assessed - None Present.   Active Listening & Reflection Utilized.  Verbalization of Feelings Encouraged.  Emotional Support Provided. Symptoms of Anxiety & Panic Attacks Validated. Feelings of Anxiousness, Nervousness, & Restlessness Acknowledged. Educated on Proper Use of Deep Breathing Exercises, Relaxation Techniques, & Mindfulness Meditation Strategies, & Encouraged Daily Implementation. Self-Enrollment in Support Group of Interest Emphasized, from List Provided. Crisis Support Information, Agencies, Services, & Resources Discussed. Problem Solving Interventions Identified. Task-Centered Solutions Implemented.   Solution-Focused Strategies Developed. Acceptance & Commitment Therapy Introduced. Brief Cognitive Behavioral Therapy Initiated. Client-Centered Therapy Enacted. Reviewed Prescription Medications & Discussed Importance of Compliance. Encouraged Administration of Medications, Exactly as Prescribed. Quality of Sleep Assessed & Sleep Hygiene Techniques Promoted. Encouraged Routine Engagement in Activities of Interest, Inside & Outside the Home. Encouraged Increased Level of Activity & Exercise, as Tolerated. Encouraged Monthly Follow-Up with Dr. Milagros Espinoza, Psychiatrist with [facility] [phone]), to Receive Psychotropic Medication Administration & Management, in An Effort to  Reduce & Manage Symptoms of Anxiety, Depression, & Panic Attacks. Encouraged Self-Enrollment with Therapist of Interest in [facility], from List Provided, to Receive Psychotherapeutic Counseling & Supportive Services, in An Effort to Reduce & Manage Symptoms of Anxiety, Depression, & Panic Attacks. Encouraged Attendance at Follow-Up Appointment with Neale Burly, Family Nurse Practitioner with [facility] (# [phone].), Scheduled on 09/11/2023 at 8:05 AM. Encouraged Contact with CSW (# [phone]), if You Have Questions, Need Assistance, or If Additional Social Work Needs Are Identified Between Now & Our Next Follow-Up Outreach Call, Scheduled on 09/23/2023 at 1:30 PM. Encouraged Attendance at Follow-Up Appointment with Dr. Delynn Flavin, Primary Care Provider with [facility] [phone]), Scheduled on 10/07/2023 at 8:30 AM.            SDOH assessments and interventions completed:  Yes.  SDOH Interventions Today    Flowsheet Row Most Recent Value  SDOH Interventions   Food Insecurity Interventions Intervention Not Indicated  Housing Interventions Intervention Not Indicated  Transportation Interventions Patient Resources (Friends/Family), Payor Benefit, Intervention Not Indicated  Utilities Interventions Intervention Not Indicated  Alcohol Usage Interventions Intervention Not Indicated (Score <7)  Depression Interventions/Treatment  Medication, Counseling, Currently on Treatment  Financial Strain Interventions Other (Comment)  [Provided Lobbyist, Services, & Resources]  Physical Activity Interventions Intervention Not Indicated  Stress Interventions Offered [employer], Provide Counseling, Other (Comment)  [Provided Counseling Resources, Offered Counseling Services, & Receives Psychotropic Medication Administration & Management by Dr. Milagros Espinoza with [facility]]   Social Connections Interventions Intervention Not Indicated  Health Literacy Interventions Intervention Not Indicated     Care Coordination Interventions:  Yes, provided.   Follow up plan: Follow up call scheduled for 09/23/2023 at 1:30 pm.  Encounter Outcome:  Patient Visit Completed.   Danford Bad, BSW, MSW, Printmaker Social Work Case Set designer Health  [facility], Population Health Direct Dial: [phone]  Fax: [phone] Email: [email or web address]

## 2023-09-08 NOTE — Progress Notes (Signed)
Care Coordination   Note   09/08/2023 Name: Concetto Singleton MRN: [id number] DOB: 03/18/1971  Hallie Koupal is a 52 y.o. year old male who sees Raliegh Ip, DO for primary care. I reached out to [name] by phone today to offer care coordination services.  Mr. Eady was given information about Care Coordination services today including:   The Care Coordination services include support from the care team which includes your Nurse Coordinator, Clinical Social Worker, or Pharmacist.  The Care Coordination team is here to help remove barriers to the health concerns and goals most important to you. Care Coordination services are voluntary, and the patient may decline or stop services at any time by request to their care team member.   Care Coordination Consent Status: Patient agreed to services and verbal consent obtained.   Follow up plan:  Telephone appointment with care coordination team member scheduled for:  Spoke with Danford Bad LCSW who said she will contact patient over next 24 hours. Patient aware.     [facility]  Care Coordination Care Guide  Direct Dial: [phone]

## 2023-09-08 NOTE — Patient Instructions (Signed)
Visit Information  Thank you for taking time to visit with me today. Please don't hesitate to contact me if I can be of assistance to you.   Following are the goals we discussed today:   Goals Addressed               This Visit's Progress     Reduce & Manage Symptoms of Anxiety & Depression. (pt-stated)   On track     Care Coordination Interventions:  Interventions Today    Flowsheet Row Most Recent Value  Chronic Disease   Chronic disease during today's visit Diabetes, Other  [Adjustment Disorder with Anxious Mood, Adjustment Insomnia, Bipolar Affective Psychosis, Drug Overdose, Generalized Anxiety Disorder, History of Concussion, Major Depressive Disorder, Recurrent Severe, without Psychosis, Mood Disorder, PTSD, Neuropathy]  General Interventions   General Interventions Discussed/Reviewed General Interventions Discussed, Labs, Vaccines, Doctor Visits, Referral to Nurse, Communication with, Level of Care, [employer], Health Screening, Annual Foot Exam, Lipid Profile, General Interventions Reviewed, Horticulturist, commercial (DME), Annual Eye Exam  [Encouraged]  Labs Hgb A1c every 3 months, Kidney Function  [Encouraged]  Vaccines COVID-19, Flu, Pneumonia, RSV, Shingles, Tetanus/Pertussis/Diphtheria  [Encouraged]  Doctor Visits Discussed/Reviewed Doctor Visits Discussed, Specialist, Doctor Visits Reviewed, Annual Wellness Visits, PCP  [Encouraged]  Health Screening Bone Density, Colonoscopy, Prostate  [Encouraged]  Durable Medical Equipment (DME) Glucomoter  [Encouraged]  PCP/Specialist Visits Compliance with follow-up visit  [Encouraged]  Communication with PCP/Specialists, RN, Pharmacists, Social Work  [Encouraged]  Level of Care Applications  [Encouraged]  Ship broker, Personal Care Services, [place]  [Encouraged]  Exercise Interventions   Exercise Discussed/Reviewed Exercise Discussed, Assistive device use and maintanence, Weight Managment, Physical Activity,  Exercise Reviewed  [Encouraged]  Physical Activity Discussed/Reviewed Physical Activity Discussed, Home Exercise Program (HEP), PREP, Gym, Physical Activity Reviewed, Types of exercise  [Encouraged]  Weight Management Weight loss  [Encouraged]  Education Interventions   Education Provided Provided Therapist, sports, Provided Web-based Education, Provided Education  [Encouraged]  Provided Verbal Education On Nutrition, Mental Health/Coping with Illness, Foot Care, Eye Care, Applications, Labs, Blood Sugar Monitoring, Exercise, Medication, Development worker, community, [employer], When to see the doctor  [Encouraged]  Labs Reviewed --  [N/A]  Applications Medicaid, Personal Care Services, [place]  [Encouraged]  Mental Health Interventions   Mental Health Discussed/Reviewed Mental Health Discussed, Mental Health Reviewed, Coping Strategies, Crisis, Other, Suicide, Substance Abuse, Grief and Loss, Depression, Anxiety  [Domestic Violence]  Nutrition Interventions   Nutrition Discussed/Reviewed Nutrition Discussed, Adding fruits and vegetables, Increasing proteins, Decreasing fats, Decreasing salt, Fluid intake, Nutrition Reviewed, Carbohydrate meal planning, Portion sizes, Decreasing sugar intake  [Encouraged]  Pharmacy Interventions   Pharmacy Dicussed/Reviewed Pharmacy Topics Discussed, Medications and their functions, Referral to Pharmacist, Medication Adherence, Pharmacy Topics Reviewed, Affording Medications  [Encouraged]  Medication Adherence --  [N/A]  Referral to Pharmacist Drug interaction/side effects  [Allergic to Psychotropic Medications]  Safety Interventions   Safety Discussed/Reviewed Safety Discussed, Safety Reviewed, Fall Risk, Home Safety  [Encouraged]  Home Safety Assistive Devices, Refer for community resources  [Encouraged]  Advanced Directive Interventions   Advanced Directives Discussed/Reviewed Advanced Directives Discussed  [Encouraged]      Assessed Social Determinant of Health  Barriers. Discussed Plans for Ongoing Care Management Follow Up. Provided Careers information officer Information for Care Management Team Members. Screened for Signs & Symptoms of Depression, Related to Chronic Disease State.  PHQ2 & PHQ9 Depression Screen Completed & Results Reviewed.  Suicidal Ideation & Homicidal Ideation Assessed - None Present.   Domestic Violence Assessed - None  Present. Access to Weapons Assessed - None Present.   Active Listening & Reflection Utilized.  Verbalization of Feelings Encouraged.  Emotional Support Provided. Symptoms of Anxiety & Panic Attacks Validated. Feelings of Anxiousness, Nervousness, & Restlessness Acknowledged. Educated on Proper Use of Deep Breathing Exercises, Relaxation Techniques, & Mindfulness Meditation Strategies, & Encouraged Daily Implementation. Self-Enrollment in Support Group of Interest Emphasized, from List Provided. Crisis Support Information, Agencies, Services, & Resources Discussed. Problem Solving Interventions Identified. Task-Centered Solutions Implemented.   Solution-Focused Strategies Developed. Acceptance & Commitment Therapy Introduced. Brief Cognitive Behavioral Therapy Initiated. Client-Centered Therapy Enacted. Reviewed Prescription Medications & Discussed Importance of Compliance. Encouraged Administration of Medications, Exactly as Prescribed. Quality of Sleep Assessed & Sleep Hygiene Techniques Promoted. Encouraged Routine Engagement in Activities of Interest, Inside & Outside the Home. Encouraged Increased Level of Activity & Exercise, as Tolerated. Encouraged Monthly Follow-Up with Dr. Milagros Evener, Psychiatrist with [facility] [phone]), to Receive Psychotropic Medication Administration & Management, in An Effort to Reduce & Manage Symptoms of Anxiety, Depression, & Panic Attacks. Encouraged Self-Enrollment with Therapist of Interest in [facility], from List Provided, to Receive  Psychotherapeutic Counseling & Supportive Services, in An Effort to Reduce & Manage Symptoms of Anxiety, Depression, & Panic Attacks. Encouraged Attendance at Follow-Up Appointment with Neale Burly, Family Nurse Practitioner with [facility] (# [phone].), Scheduled on 09/11/2023 at 8:05 AM. Encouraged Contact with CSW (# [phone]), if You Have Questions, Need Assistance, or If Additional Social Work Needs Are Identified Between Now & Our Next Follow-Up Outreach Call, Scheduled on 09/23/2023 at 1:30 PM. Encouraged Attendance at Follow-Up Appointment with Dr. Delynn Flavin, Primary Care Provider with [facility] [phone]), Scheduled on 10/07/2023 at 8:30 AM.          Our next appointment is by telephone on 09/23/2023 at 1:30 pm.  Please call the care guide team at [phone] if you need to cancel or reschedule your appointment.   If you are experiencing a Mental Health or Behavioral Health Crisis or need someone to talk to, please call the Suicide and Crisis Lifeline: 988 call the [place] National Suicide Prevention Lifeline: 820-627-0217 or TTY: 5396701569 TTY 270-287-8202) to talk to a trained counselor call 1-800-273-TALK (toll free, 24 hour hotline) go to [facility] [address], [place] [phone]) call the [facility] Crisis Line: [phone] call 911  Patient verbalizes understanding of instructions and care plan provided today and agrees to view in MyChart. Active MyChart status and patient understanding of how to access instructions and care plan via MyChart confirmed with patient.     Telephone follow up appointment with care management team member scheduled for:   09/23/2023 at 1:30 pm.  Danford Bad, BSW, MSW, LCSW  Embedded Practice Social Work Case Manager  [facility], Population Health Direct Dial:  [phone]  Fax: [phone] Email: [email or web address]

## 2023-09-10 DIAGNOSIS — F3175 Bipolar disorder, in partial remission, most recent episode depressed: Secondary | ICD-10-CM | POA: Diagnosis not present

## 2023-09-10 DIAGNOSIS — F410 Panic disorder [episodic paroxysmal anxiety] without agoraphobia: Secondary | ICD-10-CM | POA: Diagnosis not present

## 2023-09-10 DIAGNOSIS — F3173 Bipolar disorder, in partial remission, most recent episode manic: Secondary | ICD-10-CM | POA: Diagnosis not present

## 2023-09-11 ENCOUNTER — Other Ambulatory Visit: Payer: Self-pay | Admitting: Family Medicine

## 2023-09-11 ENCOUNTER — Encounter: Payer: Self-pay | Admitting: Family Medicine

## 2023-09-11 ENCOUNTER — Ambulatory Visit (INDEPENDENT_AMBULATORY_CARE_PROVIDER_SITE_OTHER): Payer: 59 | Admitting: Family Medicine

## 2023-09-11 VITALS — BP 115/77 | HR 65 | Temp 98.0°F | Ht 70.0 in | Wt 257.0 lb

## 2023-09-11 DIAGNOSIS — F411 Generalized anxiety disorder: Secondary | ICD-10-CM | POA: Diagnosis not present

## 2023-09-11 DIAGNOSIS — F4310 Post-traumatic stress disorder, unspecified: Secondary | ICD-10-CM

## 2023-09-11 DIAGNOSIS — F0781 Postconcussional syndrome: Secondary | ICD-10-CM

## 2023-09-11 DIAGNOSIS — F39 Unspecified mood [affective] disorder: Secondary | ICD-10-CM

## 2023-09-11 DIAGNOSIS — E1169 Type 2 diabetes mellitus with other specified complication: Secondary | ICD-10-CM

## 2023-09-11 DIAGNOSIS — Z87820 Personal history of traumatic brain injury: Secondary | ICD-10-CM | POA: Diagnosis not present

## 2023-09-11 MED ORDER — MECLIZINE HCL 25 MG PO TABS
25.0000 mg | ORAL_TABLET | Freq: Three times a day (TID) | ORAL | 0 refills | Status: AC | PRN
Start: 2023-09-11 — End: ?

## 2023-09-11 NOTE — Progress Notes (Signed)
Subjective:  Patient ID: Marvin Espinoza, male    DOB: 1971-08-12, 52 y.o.   MRN: [id number]  Patient Care Team: Raliegh Ip, DO as PCP - General (Family Medicine) Marlis Edelson, MD as Referring Physician (General Surgery) Saporito, Fanny Dance, LCSW as [facility] Care Management (Licensed Clinical Social Worker)   Chief Complaint:  Concussion (Follow up/Still feeling dizzy/Eyes twitching/)  HPI: Marvin Espinoza is a 52 y.o. male presenting on 09/11/2023 for Concussion (Follow up/Still feeling dizzy/Eyes twitching/)  HPI Dizzy with sitting and standing. If he turns to the side he has to focus on breathing because he is so dizzy. States that he catches himself leaning to the right side. Has almost fallen several times. Denies any more nausea and vomiting.  Cannot tell if the meclizine is helping. Is taking meclizine 4-5 times per day.  Is using the calm app for meditation and relaxation States that his psychiatrist diagnosed hime with PTSD from the event.   Relevant past medical, surgical, family, and social history reviewed and updated as indicated.  Allergies and medications reviewed and updated. Data reviewed: Chart in Epic.   Past Medical History:  Diagnosis Date   Anxiety    Bipolar disorder (HCC)    CTS (carpal tunnel syndrome)    Depression    Diabetes mellitus without complication (HCC)    GERD (gastroesophageal reflux disease)    H/O bariatric surgery 2016   Head injury 05/06/2017   Hypothyroidism    Hypothyroidism    Sleep apnea    no longer with OSA d/t over 100 pd weight loss    Past Surgical History:  Procedure Laterality Date   ANAL FISSURE REPAIR     BARIATRIC SURGERY     CARPAL TUNNEL RELEASE Left 05/09/2015   Procedure: LEFT CARPAL TUNNEL RELEASE;  Surgeon: Cindee Salt, MD;  Location: [facility];  Service: Orthopedics;  Laterality: Left;   CARPAL TUNNEL RELEASE Left 05-09-2015   CARPAL TUNNEL RELEASE Right  06/20/2015   Procedure: RIGHT CARPAL TUNNEL RELEASE;  Surgeon: Cindee Salt, MD;  Location: [facility];  Service: Orthopedics;  Laterality: Right;  REGIONAL/FAB   CHOLECYSTECTOMY     COLONOSCOPY     UPPER GASTROINTESTINAL ENDOSCOPY      Social History   Socioeconomic History   Marital status: Married    Spouse name: Kathy Marder   Number of children: 1   Years of education: 12   Highest education level: 12th grade  Occupational History   Not on file  Tobacco Use   Smoking status: Never    Passive exposure: Never   Smokeless tobacco: Never  Vaping Use   Vaping status: Never Used  Substance and Sexual Activity   Alcohol use: No    Comment: severe allergy to alcohol!!   Drug use: No   Sexual activity: Yes    Partners: Female  Other Topics Concern   Not on file  Social History Narrative   Lives with Wife, and one child   Two story   He stays there to keep an eye on the house.    His son comes to visit also.   Right handed.   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          Social Determinants of Health   Financial Resource Strain: Medium Risk (09/08/2023)   Overall Financial Resource Strain (CARDIA)    Difficulty of Paying Living Expenses: Somewhat hard  Food Insecurity: No Food Insecurity (  09/08/2023)   Hunger Vital Sign    Worried About Running Out of Food in the Last Year: Never true    Ran Out of Food in the Last Year: Never true  Transportation Needs: No Transportation Needs (09/08/2023)   PRAPARE - Administrator, Civil Service (Medical): No    Lack of Transportation (Non-Medical): No  Physical Activity: Sufficiently Active (09/08/2023)   Exercise Vital Sign    Days of Exercise per Week: 4 days    Minutes of Exercise per Session: 50 min  Recent Concern: Physical Activity - Insufficiently Active (09/02/2023)   Exercise Vital Sign    Days of Exercise per Week: 2 days    Minutes of Exercise per Session: 40 min  Stress: Stress Concern Present  (09/08/2023)   [employer] of Occupational Health - Occupational Stress Questionnaire    Feeling of Stress : Rather much  Social Connections: Socially Integrated (09/08/2023)   Social Connection and Isolation Panel [NHANES]    Frequency of Communication with Friends and Family: More than three times a week    Frequency of Social Gatherings with Friends and Family: More than three times a week    Attends Religious Services: More than 4 times per year    Active Member of [employer] or Organizations: No    Attends Engineer, structural: 1 to 4 times per year    Marital Status: Married  Recent Concern: Social Connections - Moderately Isolated (09/02/2023)   Social Connection and Isolation Panel [NHANES]    Frequency of Communication with Friends and Family: Once a week    Frequency of Social Gatherings with Friends and Family: Never    Attends Religious Services: 1 to 4 times per year    Active Member of [employer] or Organizations: No    Attends Banker Meetings: Not on file    Marital Status: Married  Catering manager Violence: Not At Risk (09/08/2023)   Humiliation, Afraid, Rape, and Kick questionnaire    Fear of Current or Ex-Partner: No    Emotionally Abused: No    Physically Abused: No    Sexually Abused: No    Outpatient Encounter Medications as of 09/11/2023  Medication Sig   ALPRAZolam (XANAX) 1 MG tablet 1 mg. Six times per day prn   Blood Glucose Monitoring Suppl (ONETOUCH VERIO) w/Device KIT Use to test blood sugar daily as directed   calcium carbonate (OS-CAL) 1250 (500 Ca) MG chewable tablet Chew 1 tablet by mouth daily.   Calcium-Vitamin D-Vitamin K (SM CALCIUM SOFT CHEWS PO) Take 1 application by mouth 2 (two) times daily. Chew calcium tabs twice a day.  (bariatric surgery)   docusate sodium (COLACE) 100 MG capsule Take 100 mg by mouth 2 (two) times daily.   famotidine (PEPCID) 20 MG tablet TAKE ONE (1) TABLET BY MOUTH TWO (2) TIMES DAILY   Ferrous Sulfate  50 MG TBCR Take by mouth.   FLUoxetine (PROZAC) 20 MG tablet Take 20 mg by mouth daily.   gabapentin (NEURONTIN) 300 MG capsule TAKE 1 CAPSULE BY MOUTH THREE TIMES A DAY   glucose blood test strip test blood sugar 2x daily Dx E11.9   hydrOXYzine (VISTARIL) 25 MG capsule Take 25 mg by mouth 3 (three) times daily.   lamoTRIgine (LAMICTAL) 200 MG tablet Take 400 mg by mouth at bedtime.    levothyroxine (SYNTHROID) 125 MCG tablet Take 125 mcg by mouth daily before breakfast.   Magnesium Gluconate (MAGNESIUM 27 PO) Take by mouth.  meclizine (ANTIVERT) 25 MG tablet Take 1 tablet (25 mg total) by mouth 3 (three) times daily as needed for dizziness.   metFORMIN (GLUCOPHAGE) 1000 MG tablet Take 1 tablet (1,000 mg total) by mouth 2 (two) times daily with a meal.   Multiple Vitamin (MULTIVITAMIN WITH MINERALS) TABS tablet Take 1 tablet by mouth 2 (two) times daily. For Vitamin supplementation (Patient taking differently: Take 1 tablet by mouth 2 (two) times daily. For Vitamin supplementation FROM GASTRIC BYPASS)   OLANZapine (ZYPREXA) 2.5 MG tablet Take 2.5 mg by mouth at bedtime.   ondansetron (ZOFRAN-ODT) 4 MG disintegrating tablet Take 1-2 tablets (4-8 mg total) by mouth every 8 (eight) hours as needed for nausea or vomiting.   OneTouch Delica Lancets 30G MISC Use to test blood sugar daily as directed   pantoprazole (PROTONIX) 40 MG tablet Take 1 tablet (40 mg total) by mouth 2 (two) times daily.   promethazine (PHENERGAN) 12.5 MG tablet TAKE 1 TO 2 TABLETS EVERY 8 HOURS AS NEEDED FOR NAUSEA AND VOMITING   psyllium (METAMUCIL) 58.6 % powder Take 1 packet by mouth 2 (two) times daily.   rosuvastatin (CRESTOR) 5 MG tablet TAKE ONE (1) TABLET BY MOUTH EVERY DAY   SOLIQUA 100-33 UNT-MCG/ML SOPN INJECT 40-60 UNITS IN TO THE SKIN IN THEMORNING   STEGLATRO 15 MG TABS tablet TAKE 1 TABLET BY MOUTH EVERY MORNING BEFORE BREAKFAST   tiZANidine (ZANAFLEX) 4 MG tablet TAKE 1 TABLET BY MOUTH EVERY 8 HOURS AS NEEDED  FOR MUSCLE SPASMS   vitamin B-12 (CYANOCOBALAMIN) 250 MCG tablet Take 1 tablet (250 mcg total) by mouth daily. For low B-12 supplementation   No facility-administered encounter medications on file as of 09/11/2023.    Allergies  Allergen Reactions   Abilify [Aripiprazole] Anaphylaxis and Swelling    "slurred speech" FACIAL DROOPING, TONGUE SWELLING, TARDIVE DYSKENESIA    Caplyta [Lumateperone] Anaphylaxis   Pregabalin Shortness Of Breath   Seroquel [Quetiapine Fumarate]     tardive dyskinesia   Alcohol Itching    Drinking alcohol   Ambien [Zolpidem] Other (See Comments)    NIGHT TERRORS   Anacin-3 [Acetaminophen] Itching    REGULAR TYLENOL IS OKAY.  NEEDS BENADRYL TO TAKE THIS   Cariprazine Other (See Comments)    Tardive dyskenesia   Cariprazine Hcl Itching   Latuda [Lurasidone Hcl] Other (See Comments)    RUNS SUGAR TOO HIGH   Lurasidone Other (See Comments)    TARDIVE DYSKINESIA   Prozac [Fluoxetine] Itching   Victoza [Liraglutide] Other (See Comments)    Severe heartburn    Hydrocodone Other (See Comments)   Bupropion Rash   Hydrocodone-Acetaminophen Itching   Risperidone Rash   Tegretol [Carbamazepine] Rash    Review of Systems As per HPI  Objective:  BP 115/77   Pulse 65   Temp 98 F (36.7 C)   Ht 5\' 10"  (1.778 m)   Wt 257 lb (116.6 kg)   SpO2 98%   BMI 36.88 kg/m    Wt Readings from Last 3 Encounters:  09/11/23 257 lb (116.6 kg)  09/03/23 253 lb (114.8 kg)  08/31/23 255 lb (115.7 kg)   Physical Exam Constitutional:      General: He is awake. He is not in acute distress.    Appearance: Normal appearance. He is well-developed and well-groomed. He is obese. He is not ill-appearing, toxic-appearing or diaphoretic.  Eyes:     General: Lids are normal. No allergic shiner.    Extraocular Movements: Extraocular movements intact.  Right eye: Nystagmus present.     Left eye: Nystagmus present.     Conjunctiva/sclera:     Right eye: Right conjunctiva  is not injected.     Left eye: Left conjunctiva is not injected.     Comments: Improved EOM exam   Cardiovascular:     Rate and Rhythm: Normal rate and regular rhythm.     Pulses: Normal pulses.          Radial pulses are 2+ on the right side and 2+ on the left side.       Posterior tibial pulses are 2+ on the right side and 2+ on the left side.     Heart sounds: Normal heart sounds. No murmur heard.    No gallop.  Pulmonary:     Effort: Pulmonary effort is normal. No respiratory distress.     Breath sounds: Normal breath sounds. No stridor. No wheezing, rhonchi or rales.  Musculoskeletal:     Cervical back: Full passive range of motion without pain and neck supple.     Right lower leg: No edema.     Left lower leg: No edema.  Skin:    General: Skin is warm.     Capillary Refill: Capillary refill takes less than 2 seconds.  Neurological:     General: No focal deficit present.     Mental Status: He is alert, oriented to person, place, and time and easily aroused. Mental status is at baseline.     GCS: GCS eye subscore is 4. GCS verbal subscore is 5. GCS motor subscore is 6.     Cranial Nerves: Cranial nerves 2-12 are intact.     Motor: No weakness.     Coordination: Coordination abnormal.     Comments: Strength equal bilaterally.   Psychiatric:        Attention and Perception: Attention and perception normal.        Mood and Affect: Affect normal. Mood is depressed.        Speech: Speech normal.        Behavior: Behavior is slowed and withdrawn. Behavior is cooperative.        Thought Content: Thought content normal. Thought content does not include homicidal or suicidal ideation. Thought content does not include homicidal or suicidal plan.        Cognition and Memory: Cognition and memory normal.        Judgment: Judgment normal.    Results for orders placed or performed during the hospital encounter of 08/31/23  Comprehensive metabolic panel  Result Value Ref Range   Sodium  137 135 - 145 mmol/L   Potassium 4.2 3.5 - 5.1 mmol/L   Chloride 104 98 - 111 mmol/L   CO2 25 22 - 32 mmol/L   Glucose, Bld 88 70 - 99 mg/dL   BUN 14 6 - 20 mg/dL   Creatinine, Ser 1.61 0.61 - 1.24 mg/dL   Calcium 8.8 (L) 8.9 - 10.3 mg/dL   Total Protein 7.0 6.5 - 8.1 g/dL   Albumin 4.0 3.5 - 5.0 g/dL   AST 35 15 - 41 U/L   ALT 46 (H) 0 - 44 U/L   Alkaline Phosphatase 74 38 - 126 U/L   Total Bilirubin 0.6 0.3 - 1.2 mg/dL   GFR, Estimated >09 >60 mL/min   Anion gap 8 5 - 15       09/11/2023    8:13 AM 09/08/2023    1:20 PM 09/03/2023    2:28  PM 06/27/2023    8:25 AM 06/27/2023    8:09 AM  Depression screen PHQ 2/9  Decreased Interest 1 1 1 3  0  Down, Depressed, Hopeless 2 2 2 2  0  PHQ - 2 Score 3 3 3 5  0  Altered sleeping 3 2 2 3    Tired, decreased energy 1 1 1 3    Change in appetite 3 2 3 3    Feeling bad or failure about yourself  1 2 3 2    Trouble concentrating 3 3 3 3    Moving slowly or fidgety/restless 3 2 3 2    Suicidal thoughts 0 0 0 0   PHQ-9 Score 17 15 18 21    Difficult doing work/chores Somewhat difficult Somewhat difficult Somewhat difficult Very difficult        09/11/2023    8:13 AM 09/03/2023    2:29 PM 06/27/2023    8:30 AM 12/23/2022    8:12 AM  GAD 7 : Generalized Anxiety Score  Nervous, Anxious, on Edge 3 2 3 3   Control/stop worrying 3 3 3 3   Worry too much - different things 3 3 3 3   Trouble relaxing 3 2 3 3   Restless 3 1 2 2   Easily annoyed or irritable 3 3 3 3   Afraid - awful might happen 2 0 1 0  Total GAD 7 Score 20 14 18 17   Anxiety Difficulty Somewhat difficult   Extremely difficult   Pertinent labs & imaging results that were available during my care of the patient were reviewed by me and considered in my medical decision making.  Assessment & Plan:  Raffaele Tronolone" was seen today for concussion.  Diagnoses and all orders for this visit:  History of concussion Refill of medication as below.  -     meclizine (ANTIVERT) 25 MG tablet; Take  1 tablet (25 mg total) by mouth 3 (three) times daily as needed for dizziness.  Post concussive syndrome Refill on medication as below. Discussed home precautions with patient. Patient to avoid screens, caffeine, stimulation. Continue eye rest. Avoid driving. Change positions slowly.   GAD (generalized anxiety disorder) Patient is established with psychiatry, they recommended counseling. Patient agreeable.. Denies SI. Safety contract established.  Patient and/or legal guardian verbally consented to [facility] services about presenting concerns and psychiatric consultation as appropriate.  The services will be billed as appropriate for the patient. -     Ambulatory referral to Integrated Behavioral Health  Mood disorder [facility]) As above  -     Ambulatory referral to Integrated Behavioral Health  PTSD (post-traumatic stress disorder) As above  -     Ambulatory referral to Integrated Behavioral Health  Continue all other maintenance medications.  Follow up plan: Return in about 1 week (around 09/18/2023) for concussion follow up.  Continue healthy lifestyle choices, including diet (rich in fruits, vegetables, and lean proteins, and low in salt and simple carbohydrates) and exercise (at least 30 minutes of moderate physical activity daily).  Written and verbal instructions provided   The above assessment and management plan was discussed with the patient. The patient verbalized understanding of and has agreed to the management plan. Patient is aware to call the clinic if they develop any new symptoms or if symptoms persist or worsen. Patient is aware when to return to the clinic for a follow-up visit. Patient educated on when it is appropriate to go to the emergency department.   Neale Burly, DNP-FNP [facility] [address]  [place], [place] [address] [phone]

## 2023-09-11 NOTE — Patient Instructions (Signed)
Meclizine maximum dose: 100 mg/day

## 2023-09-18 ENCOUNTER — Encounter: Payer: Self-pay | Admitting: Family Medicine

## 2023-09-18 ENCOUNTER — Ambulatory Visit (INDEPENDENT_AMBULATORY_CARE_PROVIDER_SITE_OTHER): Payer: 59 | Admitting: Family Medicine

## 2023-09-18 VITALS — BP 145/79 | HR 66 | Temp 98.3°F | Ht 70.0 in | Wt 258.0 lb

## 2023-09-18 DIAGNOSIS — Z87820 Personal history of traumatic brain injury: Secondary | ICD-10-CM | POA: Diagnosis not present

## 2023-09-18 DIAGNOSIS — F39 Unspecified mood [affective] disorder: Secondary | ICD-10-CM | POA: Diagnosis not present

## 2023-09-18 DIAGNOSIS — R030 Elevated blood-pressure reading, without diagnosis of hypertension: Secondary | ICD-10-CM

## 2023-09-18 MED ORDER — MECLIZINE HCL 25 MG PO TABS
25.0000 mg | ORAL_TABLET | Freq: Three times a day (TID) | ORAL | 0 refills | Status: DC | PRN
Start: 2023-09-18 — End: 2023-11-11

## 2023-09-18 NOTE — Progress Notes (Signed)
Subjective:  Patient ID: Marvin Espinoza, male    DOB: 1971/04/16, 52 y.o.   MRN: [id number]  Patient Care Team: Raliegh Ip, DO as PCP - General (Family Medicine) Marlis Edelson, MD as Referring Physician (General Surgery) Saporito, Fanny Dance, LCSW as [facility] Care Management (Licensed Clinical Social Worker)   Chief Complaint:  concussion follow up  HPI: Marvin Espinoza is a 52 y.o. male presenting on 09/18/2023 for concussion follow up States that things are slowly getting better.  Denies any nausea and vomiting.  States that dizziness is a little better, but still stumbles on occasion. Reports that he is taking ~2 per day. Reports that he has been getting outdoors and doing things such as cleaning the lawnmower and mowing grass for >8 hours at time. States that vision is back to normal. Denies any headaches.   Relevant past medical, surgical, family, and social history reviewed and updated as indicated.  Allergies and medications reviewed and updated. Data reviewed: Chart in Epic.   Past Medical History:  Diagnosis Date   Anxiety    Bipolar disorder (HCC)    CTS (carpal tunnel syndrome)    Depression    Diabetes mellitus without complication (HCC)    GERD (gastroesophageal reflux disease)    H/O bariatric surgery 2016   Head injury 05/06/2017   Hypothyroidism    Hypothyroidism    Sleep apnea    no longer with OSA d/t over 100 pd weight loss    Past Surgical History:  Procedure Laterality Date   ANAL FISSURE REPAIR     BARIATRIC SURGERY     CARPAL TUNNEL RELEASE Left 05/09/2015   Procedure: LEFT CARPAL TUNNEL RELEASE;  Surgeon: Cindee Salt, MD;  Location: [facility];  Service: Orthopedics;  Laterality: Left;   CARPAL TUNNEL RELEASE Left 05-09-2015   CARPAL TUNNEL RELEASE Right 06/20/2015   Procedure: RIGHT CARPAL TUNNEL RELEASE;  Surgeon: Cindee Salt, MD;  Location:  SURGERY CENTER;  Service: Orthopedics;  Laterality:  Right;  REGIONAL/FAB   CHOLECYSTECTOMY     COLONOSCOPY     UPPER GASTROINTESTINAL ENDOSCOPY      Social History   Socioeconomic History   Marital status: Married    Spouse name: Kipp Shank   Number of children: 1   Years of education: 12   Highest education level: 12th grade  Occupational History   Not on file  Tobacco Use   Smoking status: Never    Passive exposure: Never   Smokeless tobacco: Never  Vaping Use   Vaping status: Never Used  Substance and Sexual Activity   Alcohol use: No    Comment: severe allergy to alcohol!!   Drug use: No   Sexual activity: Yes    Partners: Female  Other Topics Concern   Not on file  Social History Narrative   Lives with Wife, and one child   Two story   He stays there to keep an eye on the house.    His son comes to visit also.   Right handed.   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          Social Determinants of Health   Financial Resource Strain: Medium Risk (09/08/2023)   Overall Financial Resource Strain (CARDIA)    Difficulty of Paying Living Expenses: Somewhat hard  Food Insecurity: No Food Insecurity (09/08/2023)   Hunger Vital Sign    Worried About Running Out of Food in the Last Year: Never  true    Ran Out of Food in the Last Year: Never true  Transportation Needs: No Transportation Needs (09/08/2023)   PRAPARE - Administrator, Civil Service (Medical): No    Lack of Transportation (Non-Medical): No  Physical Activity: Sufficiently Active (09/08/2023)   Exercise Vital Sign    Days of Exercise per Week: 4 days    Minutes of Exercise per Session: 50 min  Recent Concern: Physical Activity - Insufficiently Active (09/02/2023)   Exercise Vital Sign    Days of Exercise per Week: 2 days    Minutes of Exercise per Session: 40 min  Stress: Stress Concern Present (09/08/2023)   [employer] of Occupational Health - Occupational Stress Questionnaire    Feeling of Stress : Rather much  Social Connections:  Socially Integrated (09/08/2023)   Social Connection and Isolation Panel [NHANES]    Frequency of Communication with Friends and Family: More than three times a week    Frequency of Social Gatherings with Friends and Family: More than three times a week    Attends Religious Services: More than 4 times per year    Active Member of [employer] or Organizations: No    Attends Engineer, structural: 1 to 4 times per year    Marital Status: Married  Recent Concern: Social Connections - Moderately Isolated (09/02/2023)   Social Connection and Isolation Panel [NHANES]    Frequency of Communication with Friends and Family: Once a week    Frequency of Social Gatherings with Friends and Family: Never    Attends Religious Services: 1 to 4 times per year    Active Member of [employer] or Organizations: No    Attends Banker Meetings: Not on file    Marital Status: Married  Catering manager Violence: Not At Risk (09/08/2023)   Humiliation, Afraid, Rape, and Kick questionnaire    Fear of Current or Ex-Partner: No    Emotionally Abused: No    Physically Abused: No    Sexually Abused: No    Outpatient Encounter Medications as of 09/18/2023  Medication Sig   ALPRAZolam (XANAX) 1 MG tablet 1 mg. Six times per day prn   Blood Glucose Monitoring Suppl (ONETOUCH VERIO) w/Device KIT Use to test blood sugar daily as directed   calcium carbonate (OS-CAL) 1250 (500 Ca) MG chewable tablet Chew 1 tablet by mouth daily.   Calcium-Vitamin D-Vitamin K (SM CALCIUM SOFT CHEWS PO) Take 1 application by mouth 2 (two) times daily. Chew calcium tabs twice a day.  (bariatric surgery)   docusate sodium (COLACE) 100 MG capsule Take 100 mg by mouth 2 (two) times daily.   famotidine (PEPCID) 20 MG tablet TAKE ONE (1) TABLET BY MOUTH TWO (2) TIMES DAILY   Ferrous Sulfate 50 MG TBCR Take by mouth.   FLUoxetine (PROZAC) 20 MG tablet Take 20 mg by mouth daily.   gabapentin (NEURONTIN) 300 MG capsule TAKE 1 CAPSULE BY  MOUTH THREE TIMES A DAY   glucose blood test strip test blood sugar 2x daily Dx E11.9   hydrOXYzine (VISTARIL) 25 MG capsule Take 25 mg by mouth 3 (three) times daily.   lamoTRIgine (LAMICTAL) 200 MG tablet Take 400 mg by mouth at bedtime.    levothyroxine (SYNTHROID) 125 MCG tablet Take 125 mcg by mouth daily before breakfast.   Magnesium Gluconate (MAGNESIUM 27 PO) Take by mouth.   meclizine (ANTIVERT) 25 MG tablet Take 1 tablet (25 mg total) by mouth 3 (three) times daily as needed  for dizziness.   metFORMIN (GLUCOPHAGE) 1000 MG tablet Take 1 tablet (1,000 mg total) by mouth 2 (two) times daily with a meal.   Multiple Vitamin (MULTIVITAMIN WITH MINERALS) TABS tablet Take 1 tablet by mouth 2 (two) times daily. For Vitamin supplementation (Patient taking differently: Take 1 tablet by mouth 2 (two) times daily. For Vitamin supplementation FROM GASTRIC BYPASS)   OLANZapine (ZYPREXA) 2.5 MG tablet Take 2.5 mg by mouth at bedtime.   ondansetron (ZOFRAN-ODT) 4 MG disintegrating tablet Take 1-2 tablets (4-8 mg total) by mouth every 8 (eight) hours as needed for nausea or vomiting.   OneTouch Delica Lancets 30G MISC Use to test blood sugar daily as directed   pantoprazole (PROTONIX) 40 MG tablet Take 1 tablet (40 mg total) by mouth 2 (two) times daily.   promethazine (PHENERGAN) 12.5 MG tablet TAKE 1 TO 2 TABLETS EVERY 8 HOURS AS NEEDED FOR NAUSEA AND VOMITING   psyllium (METAMUCIL) 58.6 % powder Take 1 packet by mouth 2 (two) times daily.   rosuvastatin (CRESTOR) 5 MG tablet TAKE ONE (1) TABLET BY MOUTH EVERY DAY   SOLIQUA 100-33 UNT-MCG/ML SOPN INJECT 40-60 UNITS IN TO THE SKIN IN THEMORNING   STEGLATRO 15 MG TABS tablet TAKE 1 TABLET BY MOUTH EVERY MORNING BEFORE BREAKFAST   tiZANidine (ZANAFLEX) 4 MG tablet TAKE 1 TABLET BY MOUTH EVERY 8 HOURS AS NEEDED FOR MUSCLE SPASMS   vitamin B-12 (CYANOCOBALAMIN) 250 MCG tablet Take 1 tablet (250 mcg total) by mouth daily. For low B-12 supplementation   No  facility-administered encounter medications on file as of 09/18/2023.    Allergies  Allergen Reactions   Abilify [Aripiprazole] Anaphylaxis and Swelling    "slurred speech" FACIAL DROOPING, TONGUE SWELLING, TARDIVE DYSKENESIA    Caplyta [Lumateperone] Anaphylaxis   Pregabalin Shortness Of Breath   Seroquel [Quetiapine Fumarate]     tardive dyskinesia   Alcohol Itching    Drinking alcohol   Ambien [Zolpidem] Other (See Comments)    NIGHT TERRORS   Anacin-3 [Acetaminophen] Itching    REGULAR TYLENOL IS OKAY.  NEEDS BENADRYL TO TAKE THIS   Cariprazine Other (See Comments)    Tardive dyskenesia   Cariprazine Hcl Itching   Latuda [Lurasidone Hcl] Other (See Comments)    RUNS SUGAR TOO HIGH   Lurasidone Other (See Comments)    TARDIVE DYSKINESIA   Prozac [Fluoxetine] Itching   Victoza [Liraglutide] Other (See Comments)    Severe heartburn    Hydrocodone Other (See Comments)   Bupropion Rash   Hydrocodone-Acetaminophen Itching   Risperidone Rash   Tegretol [Carbamazepine] Rash    Review of Systems As per HPI  Objective:  BP (!) 145/79   Pulse 66   Temp 98.3 F (36.8 C)   Ht 5\' 10"  (1.778 m)   Wt 258 lb (117 kg)   SpO2 97%   BMI 37.02 kg/m    Wt Readings from Last 3 Encounters:  09/18/23 258 lb (117 kg)  09/11/23 257 lb (116.6 kg)  09/03/23 253 lb (114.8 kg)   Physical Exam Constitutional:      General: He is awake. He is not in acute distress.    Appearance: Normal appearance. He is well-developed and well-groomed. He is not ill-appearing, toxic-appearing or diaphoretic.  Eyes:     Extraocular Movements:     Right eye: Normal extraocular motion and no nystagmus.     Left eye: Normal extraocular motion and no nystagmus.  Cardiovascular:     Rate and Rhythm: Normal rate  and regular rhythm.     Pulses: Normal pulses.          Radial pulses are 2+ on the right side and 2+ on the left side.       Posterior tibial pulses are 2+ on the right side and 2+ on the  left side.     Heart sounds: Normal heart sounds. No murmur heard.    No gallop.  Pulmonary:     Effort: Pulmonary effort is normal. No respiratory distress.     Breath sounds: Normal breath sounds. No stridor. No wheezing, rhonchi or rales.  Musculoskeletal:     Cervical back: Full passive range of motion without pain and neck supple.     Right lower leg: No edema.     Left lower leg: No edema.  Skin:    General: Skin is warm.     Capillary Refill: Capillary refill takes less than 2 seconds.  Neurological:     General: No focal deficit present.     Mental Status: He is alert, oriented to person, place, and time and easily aroused. Mental status is at baseline.     GCS: GCS eye subscore is 4. GCS verbal subscore is 5. GCS motor subscore is 6.     Motor: No weakness, tremor, atrophy or abnormal muscle tone.     Gait: Gait normal.  Psychiatric:        Attention and Perception: Attention and perception normal.        Mood and Affect: Mood normal. Affect is flat.        Speech: Speech normal.        Behavior: Behavior normal. Behavior is cooperative.        Thought Content: Thought content normal. Thought content does not include homicidal or suicidal ideation. Thought content does not include homicidal or suicidal plan.        Cognition and Memory: Cognition and memory normal.        Judgment: Judgment normal.     Results for orders placed or performed during the hospital encounter of 08/31/23  Comprehensive metabolic panel  Result Value Ref Range   Sodium 137 135 - 145 mmol/L   Potassium 4.2 3.5 - 5.1 mmol/L   Chloride 104 98 - 111 mmol/L   CO2 25 22 - 32 mmol/L   Glucose, Bld 88 70 - 99 mg/dL   BUN 14 6 - 20 mg/dL   Creatinine, Ser 1.30 0.61 - 1.24 mg/dL   Calcium 8.8 (L) 8.9 - 10.3 mg/dL   Total Protein 7.0 6.5 - 8.1 g/dL   Albumin 4.0 3.5 - 5.0 g/dL   AST 35 15 - 41 U/L   ALT 46 (H) 0 - 44 U/L   Alkaline Phosphatase 74 38 - 126 U/L   Total Bilirubin 0.6 0.3 - 1.2 mg/dL    GFR, Estimated >86 >57 mL/min   Anion gap 8 5 - 15       09/11/2023    8:13 AM 09/08/2023    1:20 PM 09/03/2023    2:28 PM 06/27/2023    8:25 AM 06/27/2023    8:09 AM  Depression screen PHQ 2/9  Decreased Interest 1 1 1 3  0  Down, Depressed, Hopeless 2 2 2 2  0  PHQ - 2 Score 3 3 3 5  0  Altered sleeping 3 2 2 3    Tired, decreased energy 1 1 1 3    Change in appetite 3 2 3 3    Feeling bad or  failure about yourself  1 2 3 2    Trouble concentrating 3 3 3 3    Moving slowly or fidgety/restless 3 2 3 2    Suicidal thoughts 0 0 0 0   PHQ-9 Score 17 15 18 21    Difficult doing work/chores Somewhat difficult Somewhat difficult Somewhat difficult Very difficult        09/11/2023    8:13 AM 09/03/2023    2:29 PM 06/27/2023    8:30 AM 12/23/2022    8:12 AM  GAD 7 : Generalized Anxiety Score  Nervous, Anxious, on Edge 3 2 3 3   Control/stop worrying 3 3 3 3   Worry too much - different things 3 3 3 3   Trouble relaxing 3 2 3 3   Restless 3 1 2 2   Easily annoyed or irritable 3 3 3 3   Afraid - awful might happen 2 0 1 0  Total GAD 7 Score 20 14 18 17   Anxiety Difficulty Somewhat difficult   Extremely difficult    Pertinent labs & imaging results that were available during my care of the patient were reviewed by me and considered in my medical decision making.  Assessment & Plan:  Daiquan Resnik" was seen today for concussion follow up.  Diagnoses and all orders for this visit:  History of concussion Will extend meclizine due to continued dizziness. Reviewed red flag symptoms. Reviewed at home care. Encouraged patient to continue eye rest.  -     meclizine (ANTIVERT) 25 MG tablet; Take 1 tablet (25 mg total) by mouth 3 (three) times daily as needed for dizziness.  Mood disorder [facility]) Patient referred to [facility] at last visit. Patient to follow up with [facility]. Denies SI. Safety contract established.   Elevated blood pressure reading Elevated BP in office today. Patient believes it is due to  drinking V8 this am. Patient to monitor BP at home and bring measurements to follow up.     Continue all other maintenance medications.  Follow up plan: Return if symptoms worsen or fail to improve.   Continue healthy lifestyle choices, including diet (rich in fruits, vegetables, and lean proteins, and low in salt and simple carbohydrates) and exercise (at least 30 minutes of moderate physical activity daily).  Written and verbal instructions provided   The above assessment and management plan was discussed with the patient. The patient verbalized understanding of and has agreed to the management plan. Patient is aware to call the clinic if they develop any new symptoms or if symptoms persist or worsen. Patient is aware when to return to the clinic for a follow-up visit. Patient educated on when it is appropriate to go to the emergency department.   Neale Burly, DNP-FNP [facility] [address] [place], [place] [address] [phone]

## 2023-09-23 ENCOUNTER — Ambulatory Visit: Payer: Self-pay | Admitting: *Deleted

## 2023-09-23 NOTE — Patient Outreach (Signed)
Care Coordination   Follow Up Visit Note   09/23/2023  Name: Marvin Espinoza MRN: [id number] DOB: 07/08/1971  Marvin Espinoza is a 52 y.o. year old male who sees Raliegh Ip, DO for primary care. I spoke with [name] by phone today.  What matters to the patients health and wellness today?  Reduce & Manage Symptoms of Anxiety & Depression.    Goals Addressed               This Visit's Progress     Reduce & Manage Symptoms of Anxiety & Depression. (pt-stated)   On track     Care Coordination Interventions:  Interventions Today    Flowsheet Row Most Recent Value  Chronic Disease   Chronic disease during today's visit Diabetes, Other  [Adjustment Disorder with Anxious Mood, Adjustment Insomnia, Bipolar Affective Psychosis, Drug Overdose, Generalized Anxiety Disorder, History of Concussion, Major Depressive Disorder, Recurrent Severe, without Psychosis, Mood Disorder, PTSD, Neuropathy]  General Interventions   General Interventions Discussed/Reviewed General Interventions Discussed, Labs, Vaccines, Doctor Visits, Referral to Nurse, Communication with, Level of Care, [employer], Health Screening, Annual Foot Exam, Lipid Profile, General Interventions Reviewed, Horticulturist, commercial (DME), Annual Eye Exam  [Encouraged]  Labs Hgb A1c every 3 months, Kidney Function  [Encouraged]  Vaccines COVID-19, Flu, Pneumonia, RSV, Shingles, Tetanus/Pertussis/Diphtheria  [Encouraged]  Doctor Visits Discussed/Reviewed Doctor Visits Discussed, Specialist, Doctor Visits Reviewed, Annual Wellness Visits, PCP  [Encouraged]  Health Screening Bone Density, Colonoscopy, Prostate  [Encouraged]  Durable Medical Equipment (DME) Glucomoter  [Encouraged]  PCP/Specialist Visits Compliance with follow-up visit  [Encouraged]  Communication with PCP/Specialists, RN, Pharmacists, Social Work  [Encouraged]  Level of Care Applications  [Encouraged]  Ship broker, Personal Care  Services, FL-2  [Encouraged]  Exercise Interventions   Exercise Discussed/Reviewed Exercise Discussed, Assistive device use and maintanence, Weight Managment, Physical Activity, Exercise Reviewed  [Encouraged]  Physical Activity Discussed/Reviewed Physical Activity Discussed, Home Exercise Program (HEP), PREP, Gym, Physical Activity Reviewed, Types of exercise  [Encouraged]  Weight Management Weight loss  [Encouraged]  Education Interventions   Education Provided Provided Therapist, sports, Provided Web-based Education, Provided Education  [Encouraged]  Provided Verbal Education On Nutrition, Mental Health/Coping with Illness, Foot Care, Eye Care, Applications, Labs, Blood Sugar Monitoring, Exercise, Medication, Development worker, community, [employer], When to see the doctor  [Encouraged]  Labs Reviewed --  [N/A]  Applications Medicaid, Personal Care Services, FL-2  [Encouraged]  Mental Health Interventions   Mental Health Discussed/Reviewed Mental Health Discussed, Mental Health Reviewed, Coping Strategies, Crisis, Other, Suicide, Substance Abuse, Grief and Loss, Depression, Anxiety  [Domestic Violence]  Nutrition Interventions   Nutrition Discussed/Reviewed Nutrition Discussed, Adding fruits and vegetables, Increasing proteins, Decreasing fats, Decreasing salt, Fluid intake, Nutrition Reviewed, Carbohydrate meal planning, Portion sizes, Decreasing sugar intake  [Encouraged]  Pharmacy Interventions   Pharmacy Dicussed/Reviewed Pharmacy Topics Discussed, Medications and their functions, Referral to Pharmacist, Medication Adherence, Pharmacy Topics Reviewed, Affording Medications  [Encouraged]  Medication Adherence --  [N/A]  Referral to Pharmacist Drug interaction/side effects  [Allergic to Psychotropic Medications]  Safety Interventions   Safety Discussed/Reviewed Safety Discussed, Safety Reviewed, Fall Risk, Home Safety  [Encouraged]  Home Engineer, water, Refer for community resources   [Encouraged]  Advanced Directive Interventions   Advanced Directives Discussed/Reviewed Advanced Directives Discussed  [Encouraged]      Active Listening & Reflection Utilized.  Verbalization of Feelings Encouraged.  Emotional Support Provided. Symptoms of Chronic Pain Acknowledged. Problem Solving Interventions Indicated. Task-Centered Solutions Activated.   Solution-Focused Strategies Employed. Acceptance &  Commitment Therapy Initiated. Cognitive Behavioral Therapy Performed. Client-Centered Therapy Conducted. Encouraged Daily Implementation of Deep Breathing Exercises, Relaxation Techniques, & Mindfulness Meditation Strategies. Encouraged Journaling, As a Means to Express Thoughts, Feelings, & Emotions. Encouraged Administration of Medications, Exactly as Prescribed. Encouraged Routine Engagement in Activities of Interest, Inside & Outside the Home. Encouraged Increased Level of Activity & Exercise, as Tolerated. Encouraged Attendance at Follow-Up Appointment with Dr. Milagros Evener, Psychiatrist with [facility] [phone]), to Receive Psychotropic Medication Administration & Management, in An Effort to Reduce & Manage Symptoms of Anxiety, Depression, & Panic Attacks. Encouraged Attendance at Initial Appointment with Brain Lynnell Chad, Licensed Clinical Social Worker with [facility] [phone]), Scheduled on 09/30/2023 at 9:30 AM. Encouraged Attendance at Follow-Up Appointment with Dr. Delynn Flavin, Primary Care Provider with [facility] [phone]), Scheduled on 10/07/2023 at 8:30 AM.  Encouraged Attendance at Follow-Up Appointment with Dr. Ovid Curd, Podiatrist with [facility] [phone]), Scheduled on 10/20/2023 at 8:15 AM. Encouraged Engagement with Danford Bad, Social Work Case Manager with [facility] [phone]), if You Have Questions, Need Assistance, or If Additional Social Work Needs Are Identified Between Now & Our Next Follow-Up Outreach Call, Scheduled on 10/23/2023 at 3:30 PM.         SDOH assessments and interventions completed:  Yes.  Care Coordination Interventions:  Yes, provided.   Follow up plan: Follow up call scheduled for 10/23/2023 at 3:30 pm.  Encounter Outcome:  Patient Visit Completed.   Danford Bad, BSW, MSW, Printmaker Social Work Case Set designer Health  [facility], Population Health Direct Dial: [phone]  Fax: [phone] Email: [email or web address]: [email or web address]

## 2023-09-23 NOTE — Patient Instructions (Signed)
Visit Information  Thank you for taking time to visit with me today. Please don't hesitate to contact me if I can be of assistance to you.   Following are the goals we discussed today:   Goals Addressed               This Visit's Progress     Reduce & Manage Symptoms of Anxiety & Depression. (pt-stated)   On track     Care Coordination Interventions:  Interventions Today    Flowsheet Row Most Recent Value  Chronic Disease   Chronic disease during today's visit Diabetes, Other  [Adjustment Disorder with Anxious Mood, Adjustment Insomnia, Bipolar Affective Psychosis, Drug Overdose, Generalized Anxiety Disorder, History of Concussion, Major Depressive Disorder, Recurrent Severe, without Psychosis, Mood Disorder, PTSD, Neuropathy]  General Interventions   General Interventions Discussed/Reviewed General Interventions Discussed, Labs, Vaccines, Doctor Visits, Referral to Nurse, Communication with, Level of Care, [facility], Health Screening, Annual Foot Exam, Lipid Profile, General Interventions Reviewed, Horticulturist, commercial (DME), Annual Eye Exam  [Encouraged]  Labs Hgb A1c every 3 months, Kidney Function  [Encouraged]  Vaccines COVID-19, Flu, Pneumonia, RSV, Shingles, Tetanus/Pertussis/Diphtheria  [Encouraged]  Doctor Visits Discussed/Reviewed Doctor Visits Discussed, Specialist, Doctor Visits Reviewed, Annual Wellness Visits, PCP  [Encouraged]  Health Screening Bone Density, Colonoscopy, Prostate  [Encouraged]  Durable Medical Equipment (DME) Glucomoter  [Encouraged]  PCP/Specialist Visits Compliance with follow-up visit  [Encouraged]  Communication with PCP/Specialists, RN, Pharmacists, Social Work  [Encouraged]  Level of Care Applications  [Encouraged]  Ship broker, Personal Care Services, FL-2  [Encouraged]  Exercise Interventions   Exercise Discussed/Reviewed Exercise Discussed, Assistive device use and maintanence, Weight Managment, Physical Activity,  Exercise Reviewed  [Encouraged]  Physical Activity Discussed/Reviewed Physical Activity Discussed, Home Exercise Program (HEP), PREP, Gym, Physical Activity Reviewed, Types of exercise  [Encouraged]  Weight Management Weight loss  [Encouraged]  Education Interventions   Education Provided Provided Therapist, sports, Provided Web-based Education, Provided Education  [Encouraged]  Provided Verbal Education On Nutrition, Mental Health/Coping with Illness, Foot Care, Eye Care, Applications, Labs, Blood Sugar Monitoring, Exercise, Medication, Development worker, community, [facility], When to see the doctor  [Encouraged]  Labs Reviewed --  [N/A]  Applications Medicaid, Personal Care Services, FL-2  [Encouraged]  Mental Health Interventions   Mental Health Discussed/Reviewed Mental Health Discussed, Mental Health Reviewed, Coping Strategies, Crisis, Other, Suicide, Substance Abuse, Grief and Loss, Depression, Anxiety  [Domestic Violence]  Nutrition Interventions   Nutrition Discussed/Reviewed Nutrition Discussed, Adding fruits and vegetables, Increasing proteins, Decreasing fats, Decreasing salt, Fluid intake, Nutrition Reviewed, Carbohydrate meal planning, Portion sizes, Decreasing sugar intake  [Encouraged]  Pharmacy Interventions   Pharmacy Dicussed/Reviewed Pharmacy Topics Discussed, Medications and their functions, Referral to Pharmacist, Medication Adherence, Pharmacy Topics Reviewed, Affording Medications  [Encouraged]  Medication Adherence --  [N/A]  Referral to Pharmacist Drug interaction/side effects  [Allergic to Psychotropic Medications]  Safety Interventions   Safety Discussed/Reviewed Safety Discussed, Safety Reviewed, Fall Risk, Home Safety  [Encouraged]  Home Engineer, water, Refer for community resources  [Encouraged]  Advanced Directive Interventions   Advanced Directives Discussed/Reviewed Advanced Directives Discussed  [Encouraged]      Active Listening & Reflection  Utilized.  Verbalization of Feelings Encouraged.  Emotional Support Provided. Symptoms of Chronic Pain Acknowledged. Problem Solving Interventions Indicated. Task-Centered Solutions Activated.   Solution-Focused Strategies Employed. Acceptance & Commitment Therapy Initiated. Cognitive Behavioral Therapy Performed. Client-Centered Therapy Conducted. Encouraged Daily Implementation of Deep Breathing Exercises, Relaxation Techniques, & Mindfulness Meditation Strategies. Encouraged Journaling, As a Means to  Express Thoughts, Feelings, & Emotions. Encouraged Administration of Medications, Exactly as Prescribed. Encouraged Routine Engagement in Activities of Interest, Inside & Outside the Home. Encouraged Increased Level of Activity & Exercise, as Tolerated. Encouraged Attendance at Follow-Up Appointment with Dr. Milagros Evener, Psychiatrist with [facility] [phone]), to Receive Psychotropic Medication Administration & Management, in An Effort to Reduce & Manage Symptoms of Anxiety, Depression, & Panic Attacks. Encouraged Attendance at Initial Appointment with Brain Lynnell Chad, Licensed Clinical Social Worker with [facility] [phone]), Scheduled on 09/30/2023 at 9:30 AM. Encouraged Attendance at Follow-Up Appointment with Dr. Delynn Flavin, Primary Care Provider with [facility] [phone]), Scheduled on 10/07/2023 at 8:30 AM.  Encouraged Attendance at Follow-Up Appointment with Dr. Ovid Curd, Podiatrist with [facility] [phone]), Scheduled on 10/20/2023 at 8:15 AM. Encouraged Engagement with Danford Bad, Social Work Case Manager with [facility] [phone]), if You Have Questions, Need Assistance, or If Additional Social Work Needs Are Identified Between Now & Our Next Follow-Up Outreach Call, Scheduled on 10/23/2023 at  3:30 PM.         Our next appointment is by telephone on 10/23/2023 at 3:30 pm.  Please call the care guide team at [phone] if you need to cancel or reschedule your appointment.   If you are experiencing a Mental Health or Behavioral Health Crisis or need someone to talk to, please call the Suicide and Crisis Lifeline: 988 call the [place] National Suicide Prevention Lifeline: (918) 274-8191 or TTY: 740-117-9977 TTY (520)861-5696) to talk to a trained counselor call 1-800-273-TALK (toll free, 24 hour hotline) go to [facility] [address], [place] [phone]) call the [facility] Crisis Line: ([phone] call 911  Patient verbalizes understanding of instructions and care plan provided today and agrees to view in MyChart. Active MyChart status and patient understanding of how to access instructions and care plan via MyChart confirmed with patient.     Telephone follow up appointment with care management team member scheduled for:  10/23/2023 at 3:30 pm.  Danford Bad, BSW, MSW, LCSW  Embedded Practice Social Work Case Manager  [facility], Population Health Direct Dial: [phone]  Fax: [phone] Email: [email or web address]

## 2023-09-25 ENCOUNTER — Other Ambulatory Visit: Payer: Self-pay | Admitting: Family Medicine

## 2023-09-25 DIAGNOSIS — Z87820 Personal history of traumatic brain injury: Secondary | ICD-10-CM

## 2023-09-30 ENCOUNTER — Ambulatory Visit (INDEPENDENT_AMBULATORY_CARE_PROVIDER_SITE_OTHER): Payer: 59 | Admitting: Professional Counselor

## 2023-09-30 DIAGNOSIS — F4310 Post-traumatic stress disorder, unspecified: Secondary | ICD-10-CM

## 2023-09-30 NOTE — Patient Instructions (Signed)
If your symptoms worsen or you have thoughts of suicide/homicide, PLEASE SEEK IMMEDIATE MEDICAL ATTENTION.  You may always call:   National Suicide Hotline: 988 or (305) 837-1998 [place] Crisis Line: 615-711-1130 Crisis Recovery in [place]: 680-543-3885     These are available 24 hours a day, 7 days a week.
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
## 2023-10-01 ENCOUNTER — Telehealth (INDEPENDENT_AMBULATORY_CARE_PROVIDER_SITE_OTHER): Payer: 59 | Admitting: Professional Counselor

## 2023-10-01 DIAGNOSIS — F4310 Post-traumatic stress disorder, unspecified: Secondary | ICD-10-CM

## 2023-10-01 NOTE — BH Specialist Note (Signed)
Virtual Behavioral Health Treatment Plan Team Note  MRN: [id number] NAME: Marvin Espinoza  DATE: 10/02/23  Start time: Start Time: 1449 End time: Stop Time: 1501/10/11 Total time: Total Time in Minutes (Visit): 13  Total number of Virtual BH Treatment Team Plan encounters: 1/4  Treatment Team Attendees: Dr. Vanetta Shawl and Esmond Harps  Assessment/Provisional Diagnosis Marvin Espinoza is a 52 y.o. year old male with history of bipolar disorder, PTSD, hypothyroidism, diabetes. The patient is referred for depression.    According to the chart review, the patient has had several hospitalizations and suicide attempts, as noted by Dr. Toni Amend in 2020/10/11, and has also received ECT treatment.    # PTSD # Bipolar disorder by history The patient is under the care of a psychiatrist. Due to the complexity of his case, including the need to address PTSD, he will benefit more from traditional therapy. I recommend referring him accordingly.   Recommendation Will defer medication management to Dr. Evelene Croon, his psychiatrist Referral to therapy   Diagnoses:    ICD-10-CM   1. PTSD (post-traumatic stress disorder)  F43.10       Goals, Interventions and Follow-up Plan Goals:  Learn to manage ptsd symptoms Interventions: Mindfulness or Relaxation Training CBT Cognitive Behavioral Therapy Medication Management Recommendations: Defer Follow-up Plan: Referral to traditional cbt trauma therapy  History of the present illness Presenting Problem/Current Symptoms:  The patient is a 52 year old male with a history of depression, anxiety, PTSD, and bipolar disorder, currently seeking help for his depression, anxiety, and PTSD symptoms that are impacting his daily life. He reports feeling constantly on edge, avoiding crowds, and preferring to stay indoors. He experiences irritability, periods of dissociation, and ruminating thoughts, particularly around perfectionism and fixations on certain issues that he cannot stop  thinking about. He struggles with focus, comprehension, and is unable to work. He feels overwhelmed by his responsibilities as a son and husband, though his family is supportive, he sees them as his only source of support.   His mental health struggles began around age 30 when he experienced a panic attack and was treated with Xanax by his son's psychiatrist. By age 3, he struggled with suicidal ideation and attempted suicide with method pills, leading to hospitalization. His early life was marked by a rough upbringing with an alcoholic father whose anger and unpredictability left the patient feeling constantly anxious. His mother was unfaithful and left the family at a young age. Despite these challenges, the patient managed to cope with his anxiety and depression while working and raising his son as a primary caretaker after his first wife left him in 10/11/2010.   In recent years, the patient remarried and had another child, but his mother's illness and eventual death from throat cancer in 2019/10/12 caused a significant increase in stress. After her passing, he experienced another bout of severe depression and suicidal thoughts, resulting in a second suicide attempt with method and hospitalization. Currently, he reports no thoughts, plans, or intent for suicide and is being treated by a local psychiatrist. He has no history of substance abuse and is seeking therapy to address his PTSD.    Screenings PHQ-9 Assessments:     09/30/2023    9:44 AM 09/18/2023    8:44 AM 09/11/2023    8:13 AM  Depression screen PHQ 2/9  Decreased Interest 1 1 1   Down, Depressed, Hopeless 2 2 2   PHQ - 2 Score 3 3 3   Altered sleeping 2 3 3   Tired, decreased energy 2 2 1  Change in appetite 2 3 3   Feeling bad or failure about yourself  3 3 1   Trouble concentrating 3 3 3   Moving slowly or fidgety/restless 2 3 3   Suicidal thoughts 0 0 0  PHQ-9 Score 17 20 17   Difficult doing work/chores Very difficult Somewhat difficult  Somewhat difficult   GAD-7 Assessments:     09/30/2023    9:47 AM 09/18/2023    8:44 AM 09/11/2023    8:13 AM 09/03/2023    2:29 PM  GAD 7 : Generalized Anxiety Score  Nervous, Anxious, on Edge 3 2 3 2   Control/stop worrying 3 3 3 3   Worry too much - different things 3 3 3 3   Trouble relaxing 3 2 3 2   Restless 2 2 3 1   Easily annoyed or irritable 2 3 3 3   Afraid - awful might happen 2  2 0  Total GAD 7 Score 18  20 14   Anxiety Difficulty Very difficult Somewhat difficult Somewhat difficult     Past Medical History Past Medical History:  Diagnosis Date   Anxiety    Bipolar disorder (HCC)    CTS (carpal tunnel syndrome)    Depression    Diabetes mellitus without complication (HCC)    GERD (gastroesophageal reflux disease)    H/O bariatric surgery 2016   Head injury 05/06/2017   Hypothyroidism    Hypothyroidism    Sleep apnea    no longer with OSA d/t over 100 pd weight loss    Vital signs: There were no vitals filed for this visit.  Allergies:  Allergies as of 10/01/2023 - Review Complete 09/23/2023  Allergen Reaction Noted   Abilify [aripiprazole] Anaphylaxis and Swelling 05/09/2015   Caplyta [lumateperone] Anaphylaxis 12/29/2020   Pregabalin Shortness Of Breath 09/03/2023   Seroquel [quetiapine fumarate]  12/01/2017   Alcohol Itching 12/02/2016   Ambien [zolpidem] Other (See Comments) 05/16/2020   Anacin-3 [acetaminophen] Itching 05/16/2020   Cariprazine Other (See Comments) 07/24/2017   Cariprazine hcl Itching 09/02/2017   Latuda [lurasidone hcl] Other (See Comments) 08/03/2018   Lurasidone Other (See Comments) 11/14/2017   Prozac [fluoxetine] Itching 05/16/2020   Victoza [liraglutide] Other (See Comments) 05/09/2015   Hydrocodone Other (See Comments) 09/03/2023   Bupropion Rash 11/14/2017   Hydrocodone-acetaminophen Itching 12/02/2016   Risperidone Rash 07/24/2017   Tegretol [carbamazepine] Rash 05/10/2020    Medication History Current medications:   Outpatient Encounter Medications as of 10/01/2023  Medication Sig   ALPRAZolam (XANAX) 1 MG tablet 1 mg. Six times per day prn   Blood Glucose Monitoring Suppl (ONETOUCH VERIO) w/Device KIT Use to test blood sugar daily as directed   calcium carbonate (OS-CAL) 1250 (500 Ca) MG chewable tablet Chew 1 tablet by mouth daily.   Calcium-Vitamin D-Vitamin K (SM CALCIUM SOFT CHEWS PO) Take 1 application by mouth 2 (two) times daily. Chew calcium tabs twice a day.  (bariatric surgery)   docusate sodium (COLACE) 100 MG capsule Take 100 mg by mouth 2 (two) times daily.   famotidine (PEPCID) 20 MG tablet TAKE ONE (1) TABLET BY MOUTH TWO (2) TIMES DAILY   Ferrous Sulfate 50 MG TBCR Take by mouth.   FLUoxetine (PROZAC) 20 MG tablet Take 20 mg by mouth daily.   gabapentin (NEURONTIN) 300 MG capsule TAKE 1 CAPSULE BY MOUTH THREE TIMES A DAY   glucose blood test strip test blood sugar 2x daily Dx E11.9   hydrOXYzine (VISTARIL) 25 MG capsule Take 25 mg by mouth 3 (three) times daily.  lamoTRIgine (LAMICTAL) 200 MG tablet Take 400 mg by mouth at bedtime.    levothyroxine (SYNTHROID) 125 MCG tablet Take 125 mcg by mouth daily before breakfast.   Magnesium Gluconate (MAGNESIUM 27 PO) Take by mouth.   meclizine (ANTIVERT) 25 MG tablet Take 1 tablet (25 mg total) by mouth 3 (three) times daily as needed for dizziness.   metFORMIN (GLUCOPHAGE) 1000 MG tablet Take 1 tablet (1,000 mg total) by mouth 2 (two) times daily with a meal.   Multiple Vitamin (MULTIVITAMIN WITH MINERALS) TABS tablet Take 1 tablet by mouth 2 (two) times daily. For Vitamin supplementation (Patient taking differently: Take 1 tablet by mouth 2 (two) times daily. For Vitamin supplementation FROM GASTRIC BYPASS)   OLANZapine (ZYPREXA) 2.5 MG tablet Take 2.5 mg by mouth at bedtime.   ondansetron (ZOFRAN-ODT) 4 MG disintegrating tablet Take 1-2 tablets (4-8 mg total) by mouth every 8 (eight) hours as needed for nausea or vomiting.   OneTouch Delica  Lancets 30G MISC Use to test blood sugar daily as directed   pantoprazole (PROTONIX) 40 MG tablet Take 1 tablet (40 mg total) by mouth 2 (two) times daily.   promethazine (PHENERGAN) 12.5 MG tablet TAKE 1 TO 2 TABLETS EVERY 8 HOURS AS NEEDED FOR NAUSEA AND VOMITING   psyllium (METAMUCIL) 58.6 % powder Take 1 packet by mouth 2 (two) times daily.   rosuvastatin (CRESTOR) 5 MG tablet TAKE ONE (1) TABLET BY MOUTH EVERY DAY   SOLIQUA 100-33 UNT-MCG/ML SOPN INJECT 40-60 UNITS IN TO THE SKIN IN THEMORNING   STEGLATRO 15 MG TABS tablet TAKE 1 TABLET BY MOUTH EVERY MORNING BEFORE BREAKFAST   tiZANidine (ZANAFLEX) 4 MG tablet TAKE 1 TABLET BY MOUTH EVERY 8 HOURS AS NEEDED FOR MUSCLE SPASMS   vitamin B-12 (CYANOCOBALAMIN) 250 MCG tablet Take 1 tablet (250 mcg total) by mouth daily. For low B-12 supplementation   No facility-administered encounter medications on file as of 10/01/2023.     Scribe for Treatment Team: Reuel Boom

## 2023-10-06 ENCOUNTER — Ambulatory Visit (INDEPENDENT_AMBULATORY_CARE_PROVIDER_SITE_OTHER): Payer: 59 | Admitting: Professional Counselor

## 2023-10-06 DIAGNOSIS — F4310 Post-traumatic stress disorder, unspecified: Secondary | ICD-10-CM

## 2023-10-06 NOTE — BH Specialist Note (Signed)
Marvin Espinoza Virtual BH Telephone Follow-up  MRN: [id number] NAME: Marvin Espinoza Date: 10/06/23  Start time: Start Time: 0900 End time: Stop Time: 0930 Total time: Total Time in Minutes (Visit): 30 Call number: Visit Number: 3- Third Visit  Reason for call today:  The patient, a 52 year old male, returned for a collaborative care follow-up to discuss the findings from his recent psychiatric consultation. It was determined that the patient is not an appropriate fit for collaborative care at this time, as he is already connected with psychiatry for medication management and requires a specialized trauma therapist. Additionally, there is a conflict of interest since the behavioral health counselor is currently seeing the patient's son.  As a result, a referral will be made to connect the patient with a trauma specialist, which he agreed to and understood the rationale behind the recommendations. During the visit, the patient reported some improvement in his symptoms, though he has experienced a few depressed days. His depression is largely related to a recent concussion, which has caused dizziness and low energy, limiting his ability to stay as active as he would like. The behavioral health counselor provided the patient with the name of a nearby therapy agency specializing in trauma.   PHQ-9 Scores:     09/30/2023    9:44 AM 09/18/2023    8:44 AM 09/11/2023    8:13 AM 09/08/2023    1:20 PM 09/03/2023    2:28 PM  Depression screen PHQ 2/9  Decreased Interest 1 1 1 1 1   Down, Depressed, Hopeless 2 2 2 2 2   PHQ - 2 Score 3 3 3 3 3   Altered sleeping 2 3 3 2 2   Tired, decreased energy 2 2 1 1 1   Change in appetite 2 3 3 2 3   Feeling bad or failure about yourself  3 3 1 2 3   Trouble concentrating 3 3 3 3 3   Moving slowly or fidgety/restless 2 3 3 2 3   Suicidal thoughts 0 0 0 0 0  PHQ-9 Score 17 20 17 15 18   Difficult doing work/chores Very difficult Somewhat difficult Somewhat difficult  Somewhat difficult Somewhat difficult   GAD-7 Scores:     09/30/2023    9:47 AM 09/18/2023    8:44 AM 09/11/2023    8:13 AM 09/03/2023    2:29 PM  GAD 7 : Generalized Anxiety Score  Nervous, Anxious, on Edge 3 2 3 2   Control/stop worrying 3 3 3 3   Worry too much - different things 3 3 3 3   Trouble relaxing 3 2 3 2   Restless 2 2 3 1   Easily annoyed or irritable 2 3 3 3   Afraid - awful might happen 2  2 0  Total GAD 7 Score 18  20 14   Anxiety Difficulty Very difficult Somewhat difficult Somewhat difficult     Stress Current stressors:  anxiety, depression, concussion Sleep:  Fair Appetite:  Good Coping ability:  Fair Patient taking medications as prescribed:  Yes  Current medications:  Outpatient Encounter Medications as of 10/06/2023  Medication Sig   ALPRAZolam (XANAX) 1 MG tablet 1 mg. Six times per day prn   Blood Glucose Monitoring Suppl (ONETOUCH VERIO) w/Device KIT Use to test blood sugar daily as directed   calcium carbonate (OS-CAL) 1250 (500 Ca) MG chewable tablet Chew 1 tablet by mouth daily.   Calcium-Vitamin D-Vitamin K (SM CALCIUM SOFT CHEWS PO) Take 1 application by mouth 2 (two) times daily. Chew calcium tabs twice a day.  (bariatric  surgery)   docusate sodium (COLACE) 100 MG capsule Take 100 mg by mouth 2 (two) times daily.   famotidine (PEPCID) 20 MG tablet TAKE ONE (1) TABLET BY MOUTH TWO (2) TIMES DAILY   Ferrous Sulfate 50 MG TBCR Take by mouth.   FLUoxetine (PROZAC) 20 MG tablet Take 20 mg by mouth daily.   gabapentin (NEURONTIN) 300 MG capsule TAKE 1 CAPSULE BY MOUTH THREE TIMES A DAY   glucose blood test strip test blood sugar 2x daily Dx E11.9   hydrOXYzine (VISTARIL) 25 MG capsule Take 25 mg by mouth 3 (three) times daily.   lamoTRIgine (LAMICTAL) 200 MG tablet Take 400 mg by mouth at bedtime.    levothyroxine (SYNTHROID) 125 MCG tablet Take 125 mcg by mouth daily before breakfast.   Magnesium Gluconate (MAGNESIUM 27 PO) Take by mouth.   meclizine  (ANTIVERT) 25 MG tablet Take 1 tablet (25 mg total) by mouth 3 (three) times daily as needed for dizziness.   metFORMIN (GLUCOPHAGE) 1000 MG tablet Take 1 tablet (1,000 mg total) by mouth 2 (two) times daily with a meal.   Multiple Vitamin (MULTIVITAMIN WITH MINERALS) TABS tablet Take 1 tablet by mouth 2 (two) times daily. For Vitamin supplementation (Patient taking differently: Take 1 tablet by mouth 2 (two) times daily. For Vitamin supplementation FROM GASTRIC BYPASS)   OLANZapine (ZYPREXA) 2.5 MG tablet Take 2.5 mg by mouth at bedtime.   ondansetron (ZOFRAN-ODT) 4 MG disintegrating tablet Take 1-2 tablets (4-8 mg total) by mouth every 8 (eight) hours as needed for nausea or vomiting.   OneTouch Delica Lancets 30G MISC Use to test blood sugar daily as directed   pantoprazole (PROTONIX) 40 MG tablet Take 1 tablet (40 mg total) by mouth 2 (two) times daily.   promethazine (PHENERGAN) 12.5 MG tablet TAKE 1 TO 2 TABLETS EVERY 8 HOURS AS NEEDED FOR NAUSEA AND VOMITING   psyllium (METAMUCIL) 58.6 % powder Take 1 packet by mouth 2 (two) times daily.   rosuvastatin (CRESTOR) 5 MG tablet TAKE ONE (1) TABLET BY MOUTH EVERY DAY   SOLIQUA 100-33 UNT-MCG/ML SOPN INJECT 40-60 UNITS IN TO THE SKIN IN THEMORNING   STEGLATRO 15 MG TABS tablet TAKE 1 TABLET BY MOUTH EVERY MORNING BEFORE BREAKFAST   tiZANidine (ZANAFLEX) 4 MG tablet TAKE 1 TABLET BY MOUTH EVERY 8 HOURS AS NEEDED FOR MUSCLE SPASMS   vitamin B-12 (CYANOCOBALAMIN) 250 MCG tablet Take 1 tablet (250 mcg total) by mouth daily. For low B-12 supplementation   No facility-administered encounter medications on file as of 10/06/2023.     Self-harm Behaviors Risk Assessment Self-harm risk factors:  Depression, anxiety, ptsd, past attempts Patient endorses recent thoughts of harming self:  Denies   Danger to Others Risk Assessment Danger to others risk factors:  None Patient endorses recent thoughts of harming others:  Denies  Dynamic Appraisal of  Situational Aggression (DASA):     10/05/2018    9:00 AM 10/06/2018    8:00 AM 01/26/2020    8:00 PM 01/27/2020    8:30 AM 01/27/2020    1:00 PM 01/28/2020    7:45 AM 01/29/2020    8:00 AM  CHL DYNAMIC APPRAISAL OF SITUATIONAL AGGRESSION (DASA)  Irritability 0 0 0 0 0 0 0  Impulsivity 0 0 0 0 0 0 0  Unwillingness to Follow Directions 0 0 0 0 0 0 0  Sensitivity to Perceived Provocation 0 0 0 0 0 0 0  Easily Angered When Requests are Denied 0 0 0  0 0 0 0  Negative Attitudes 0 0 0 0 0 0 0  Verbal Threats 0 0 0 0 0 0 0  Total DASA Score 0 0 0 0 0 0 0  Final Risk Rating Low Risk Low Risk Low Risk Low Risk Low Risk Low Risk Low Risk  Physical Aggression against OBJECTS No No No No No No No  Verbal Aggression against OTHER PEOPLE No No No No No No No  Physical Aggression against OTHER PEOPLE No No No No No No No    Goals, Interventions and Follow-up Plan Goals: Increase healthy adjustment to current life circumstances Interventions: Mindfulness or Relaxation Training and CBT Cognitive Behavioral Therapy Follow-up Plan:  Refer to trama specialist   Reuel Boom

## 2023-10-07 ENCOUNTER — Ambulatory Visit (INDEPENDENT_AMBULATORY_CARE_PROVIDER_SITE_OTHER): Payer: 59 | Admitting: Family Medicine

## 2023-10-07 ENCOUNTER — Telehealth: Payer: Self-pay | Admitting: Family Medicine

## 2023-10-07 ENCOUNTER — Encounter: Payer: Self-pay | Admitting: Family Medicine

## 2023-10-07 VITALS — BP 120/77 | HR 62 | Temp 98.7°F | Ht 70.0 in | Wt 268.0 lb

## 2023-10-07 DIAGNOSIS — S060X0D Concussion without loss of consciousness, subsequent encounter: Secondary | ICD-10-CM

## 2023-10-07 DIAGNOSIS — R7989 Other specified abnormal findings of blood chemistry: Secondary | ICD-10-CM

## 2023-10-07 DIAGNOSIS — E119 Type 2 diabetes mellitus without complications: Secondary | ICD-10-CM | POA: Diagnosis not present

## 2023-10-07 DIAGNOSIS — Z7984 Long term (current) use of oral hypoglycemic drugs: Secondary | ICD-10-CM

## 2023-10-07 LAB — BAYER DCA HB A1C WAIVED: HB A1C (BAYER DCA - WAIVED): 6.1 % — ABNORMAL HIGH (ref 4.8–5.6)

## 2023-10-07 NOTE — Telephone Encounter (Signed)
Please call patient to schedule appt with Marvin Espinoza to discuss medications for DM.

## 2023-10-07 NOTE — Progress Notes (Signed)
Subjective: CC:DM PCP: Raliegh Ip, DO NWG:Marvin Espinoza is a 52 y.o. male presenting to clinic today for:  1. Type 2 Diabetes with hypertension, hyperlipidemia and elevation in LFTs:  Reports compliance with medications.  He has been injecting 40 units of Soliqua lately.  Compliant with the Steglatro.  His biggest concern is the weight gain that he is experienced.  He notes that this is a side effect of the medication he is on for mental health.  He worries about his blood sugar becoming uncontrolled. Diabetes Health Maintenance Due  Topic Date Due   FOOT EXAM  12/24/2023   HEMOGLOBIN A1C  12/28/2023   OPHTHALMOLOGY EXAM  03/17/2024    Last A1c:  Lab Results  Component Value Date   HGBA1C 5.9 (H) 06/27/2023    ROS: No reports of chest pain, shortness of breath.  However he does report a fall that occurred at a [employer].  He was diagnosed with a concussion and notes that now over a month later he continues to have some intermittent dizziness with position changes.  He has not reached out to neurology but would be interested in being further evaluated   ROS: Per HPI  Allergies  Allergen Reactions   Abilify [Aripiprazole] Anaphylaxis and Swelling    "slurred speech" FACIAL DROOPING, TONGUE SWELLING, TARDIVE DYSKENESIA    Caplyta [Lumateperone] Anaphylaxis   Pregabalin Shortness Of Breath   Seroquel [Quetiapine Fumarate]     tardive dyskinesia   Alcohol Itching    Drinking alcohol   Ambien [Zolpidem] Other (See Comments)    NIGHT TERRORS   Anacin-3 [Acetaminophen] Itching    REGULAR TYLENOL IS OKAY.  NEEDS BENADRYL TO TAKE THIS   Cariprazine Other (See Comments)    Tardive dyskenesia   Cariprazine Hcl Itching   Latuda [Lurasidone Hcl] Other (See Comments)    RUNS SUGAR TOO HIGH   Lurasidone Other (See Comments)    TARDIVE DYSKINESIA   Prozac [Fluoxetine] Itching   Victoza [Liraglutide] Other (See Comments)    Severe heartburn    Hydrocodone Other (See  Comments)   Bupropion Rash   Hydrocodone-Acetaminophen Itching   Risperidone Rash   Tegretol [Carbamazepine] Rash   Past Medical History:  Diagnosis Date   Anxiety    Bipolar disorder (HCC)    CTS (carpal tunnel syndrome)    Depression    Diabetes mellitus without complication (HCC)    GERD (gastroesophageal reflux disease)    H/O bariatric surgery 2016   Head injury 05/06/2017   Hypothyroidism    Hypothyroidism    Sleep apnea    no longer with OSA d/t over 100 pd weight loss    Current Outpatient Medications:    ALPRAZolam (XANAX) 1 MG tablet, 1 mg. Six times per day prn, Disp: , Rfl:    Blood Glucose Monitoring Suppl (ONETOUCH VERIO) w/Device KIT, Use to test blood sugar daily as directed, Disp: 1 kit, Rfl: 0   calcium carbonate (OS-CAL) 1250 (500 Ca) MG chewable tablet, Chew 1 tablet by mouth daily., Disp: , Rfl:    Calcium-Vitamin D-Vitamin K (SM CALCIUM SOFT CHEWS PO), Take 1 application by mouth 2 (two) times daily. Chew calcium tabs twice a day.  (bariatric surgery), Disp: , Rfl:    docusate sodium (COLACE) 100 MG capsule, Take 100 mg by mouth 2 (two) times daily., Disp: , Rfl:    famotidine (PEPCID) 20 MG tablet, TAKE ONE (1) TABLET BY MOUTH TWO (2) TIMES DAILY, Disp: 180 tablet, Rfl: 0  Ferrous Sulfate 50 MG TBCR, Take by mouth., Disp: , Rfl:    FLUoxetine (PROZAC) 20 MG tablet, Take 20 mg by mouth daily., Disp: , Rfl:    gabapentin (NEURONTIN) 300 MG capsule, TAKE 1 CAPSULE BY MOUTH THREE TIMES A DAY, Disp: 270 capsule, Rfl: 3   glucose blood test strip, test blood sugar 2x daily Dx E11.9, Disp: 100 each, Rfl: 3   hydrOXYzine (VISTARIL) 25 MG capsule, Take 25 mg by mouth 3 (three) times daily., Disp: , Rfl:    lamoTRIgine (LAMICTAL) 200 MG tablet, Take 400 mg by mouth at bedtime. , Disp: , Rfl:    levothyroxine (SYNTHROID) 125 MCG tablet, Take 125 mcg by mouth daily before breakfast., Disp: , Rfl:    Magnesium Gluconate (MAGNESIUM 27 PO), Take by mouth., Disp: , Rfl:     meclizine (ANTIVERT) 25 MG tablet, Take 1 tablet (25 mg total) by mouth 3 (three) times daily as needed for dizziness., Disp: 90 tablet, Rfl: 0   metFORMIN (GLUCOPHAGE) 1000 MG tablet, Take 1 tablet (1,000 mg total) by mouth 2 (two) times daily with a meal., Disp: 180 tablet, Rfl: 3   Multiple Vitamin (MULTIVITAMIN WITH MINERALS) TABS tablet, Take 1 tablet by mouth 2 (two) times daily. For Vitamin supplementation (Patient taking differently: Take 1 tablet by mouth 2 (two) times daily. For Vitamin supplementation FROM GASTRIC BYPASS), Disp: , Rfl:    OLANZapine (ZYPREXA) 2.5 MG tablet, Take 2.5 mg by mouth at bedtime., Disp: , Rfl:    ondansetron (ZOFRAN-ODT) 4 MG disintegrating tablet, Take 1-2 tablets (4-8 mg total) by mouth every 8 (eight) hours as needed for nausea or vomiting., Disp: 30 tablet, Rfl: 0   OneTouch Delica Lancets 30G MISC, Use to test blood sugar daily as directed, Disp: 100 each, Rfl: 3   pantoprazole (PROTONIX) 40 MG tablet, Take 1 tablet (40 mg total) by mouth 2 (two) times daily., Disp: 180 tablet, Rfl: 3   promethazine (PHENERGAN) 12.5 MG tablet, TAKE 1 TO 2 TABLETS EVERY 8 HOURS AS NEEDED FOR NAUSEA AND VOMITING, Disp: 60 tablet, Rfl: 0   psyllium (METAMUCIL) 58.6 % powder, Take 1 packet by mouth 2 (two) times daily., Disp: , Rfl:    rosuvastatin (CRESTOR) 5 MG tablet, TAKE ONE (1) TABLET BY MOUTH EVERY DAY, Disp: 90 tablet, Rfl: 3   SOLIQUA 100-33 UNT-MCG/ML SOPN, INJECT 40-60 UNITS IN TO THE SKIN IN THEMORNING, Disp: 15 mL, Rfl: 0   STEGLATRO 15 MG TABS tablet, TAKE 1 TABLET BY MOUTH EVERY MORNING BEFORE BREAKFAST, Disp: 90 tablet, Rfl: 0   tiZANidine (ZANAFLEX) 4 MG tablet, TAKE 1 TABLET BY MOUTH EVERY 8 HOURS AS NEEDED FOR MUSCLE SPASMS, Disp: 90 tablet, Rfl: 2   vitamin B-12 (CYANOCOBALAMIN) 250 MCG tablet, Take 1 tablet (250 mcg total) by mouth daily. For low B-12 supplementation, Disp: 1 tablet, Rfl:  Social History   Socioeconomic History   Marital status: Married     Spouse name: Marvin Espinoza   Number of children: 1   Years of education: 12   Highest education level: 12th grade  Occupational History   Not on file  Tobacco Use   Smoking status: Never    Passive exposure: Never   Smokeless tobacco: Never  Vaping Use   Vaping status: Never Used  Substance and Sexual Activity   Alcohol use: No    Comment: severe allergy to alcohol!!   Drug use: No   Sexual activity: Yes    Partners: Female  Other Topics  Concern   Not on file  Social History Narrative   Lives with Wife, and one child   Two story   He stays there to keep an eye on the house.    His son comes to visit also.   Right handed.   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          Social Determinants of Health   Financial Resource Strain: Medium Risk (10/05/2023)   Overall Financial Resource Strain (CARDIA)    Difficulty of Paying Living Expenses: Somewhat hard  Food Insecurity: No Food Insecurity (10/05/2023)   Hunger Vital Sign    Worried About Running Out of Food in the Last Year: Never true    Ran Out of Food in the Last Year: Never true  Transportation Needs: No Transportation Needs (10/05/2023)   PRAPARE - Administrator, Civil Service (Medical): No    Lack of Transportation (Non-Medical): No  Physical Activity: Insufficiently Active (10/05/2023)   Exercise Vital Sign    Days of Exercise per Week: 1 day    Minutes of Exercise per Session: 100 min  Stress: Stress Concern Present (10/05/2023)   [employer] of Occupational Health - Occupational Stress Questionnaire    Feeling of Stress : Rather much  Social Connections: Moderately Isolated (10/05/2023)   Social Connection and Isolation Panel [NHANES]    Frequency of Communication with Friends and Family: Once a week    Frequency of Social Gatherings with Friends and Family: Never    Attends Religious Services: Never    Database administrator or Organizations: No    Attends Engineer, structural: 1 to 4  times per year    Marital Status: Married  Catering manager Violence: Not At Risk (09/08/2023)   Humiliation, Afraid, Rape, and Kick questionnaire    Fear of Current or Ex-Partner: No    Emotionally Abused: No    Physically Abused: No    Sexually Abused: No   Family History  Problem Relation Age of Onset   Diabetes Mother    Heart disease Mother    Depression Mother    Esophageal cancer Mother    COPD Father    Heart disease Father    Kidney disease Father    Mental illness Father    Depression Father    Mental illness Sister    Depression Sister    Depression Son    Diabetes Mellitus II Son    Hyperlipidemia Son    Esophageal cancer Maternal Uncle    Colon cancer Neg Hx    Rectal cancer Neg Hx    Stomach cancer Neg Hx     Objective: Office vital signs reviewed. BP 120/77   Pulse 62   Temp 98.7 F (37.1 C)   Ht 5\' 10"  (1.778 m)   Wt 268 lb (121.6 kg)   SpO2 98%   BMI 38.45 kg/m   Physical Examination General: Awake, alert, morbidly obese, No acute distress HEENT: Sclera white.  Moist mucous membranes Cardio: regular rate and rhythm, S1S2 heard, no murmurs appreciated Pulm: clear to auscultation bilaterally, no wheezes, rhonchi or rales; normal work of breathing on room air MSK: Ambulates independently.  Assessment/ Plan: 52 y.o. male   Diabetes mellitus treated with oral medication (HCC) - Plan: Bayer DCA Hb A1c Waived  Elevated LFTs - Plan: Hepatic Function Panel, Acute Hep Panel & Hep B Surface Ab, ANA w/Reflex if Positive  Concussion without loss of consciousness, subsequent encounter - Plan:  Ambulatory referral to Sports Medicine  A1c demonstrates excellent control of diabetes with A1c of 6.1  We will recheck his liver function and I am going to add ANA as well as hepatitis panel given elevations noted along the last couple of lab draws.  Per his report today he was using a lot of muscle relaxants during that time and that is what he attributes this to.   However, my concern is is that he has gained weight on medications and may be experiencing evidence of fatty liver.  If his LFTs remain elevated, he is agreed to right upper quadrant ultrasound to further evaluate this but again reiterated the main need for fatty liver disease will be reduction in weight.  We discussed ways to eliminate unnecessary carbs and we talked about portion control today.  A handout was provided to help simplify this for the patient  I have referred him to sports medicine for further evaluation of this concussion as his neurologist does not deal with concussions anymore.  Raliegh Ip, DO [facility] [phone]

## 2023-10-07 NOTE — Telephone Encounter (Signed)
Please call patient to discuss referral that was mentioned at last visit.

## 2023-10-08 LAB — ACUTE HEP PANEL AND HEP B SURFACE AB
Hep A IgM: NEGATIVE
Hep B C IgM: NEGATIVE
Hep C Virus Ab: NONREACTIVE
Hepatitis B Surf Ab Quant: <3.5 m[IU]/mL — ABNORMAL LOW
Hepatitis B Surface Ag: NEGATIVE

## 2023-10-08 LAB — HEPATIC FUNCTION PANEL
ALT: 38 [IU]/L (ref 0–44)
AST: 32 [IU]/L (ref 0–40)
Albumin: 4.2 g/dL (ref 3.8–4.9)
Alkaline Phosphatase: 68 IU/L (ref 44–121)
Bilirubin Total: 0.3 mg/dL (ref 0.0–1.2)
Bilirubin, Direct: 0.12 mg/dL (ref 0.00–0.40)
Total Protein: 6.3 g/dL (ref 6.0–8.5)

## 2023-10-08 LAB — ANA W/REFLEX IF POSITIVE: Anti Nuclear Antibody (ANA): NEGATIVE

## 2023-10-09 ENCOUNTER — Encounter: Payer: Self-pay | Admitting: Family Medicine

## 2023-10-09 ENCOUNTER — Ambulatory Visit: Payer: 59 | Admitting: Family Medicine

## 2023-10-09 VITALS — BP 128/86 | Ht 70.0 in | Wt 265.0 lb

## 2023-10-09 DIAGNOSIS — R42 Dizziness and giddiness: Secondary | ICD-10-CM

## 2023-10-09 DIAGNOSIS — F0781 Postconcussional syndrome: Secondary | ICD-10-CM

## 2023-10-09 DIAGNOSIS — S060X0A Concussion without loss of consciousness, initial encounter: Secondary | ICD-10-CM

## 2023-10-09 NOTE — Addendum Note (Signed)
Addended by: Rutha Bouchard E on: 10/09/2023 04:29 PM   Modules accepted: Orders

## 2023-10-09 NOTE — Patient Instructions (Signed)
It was a pleasure seeing you today. You sustained a concussion without loss of consciousness 08/31/23 Your ongoing symptoms are consistent with vestibular dysfunction and ongoing post-concussive syndrome - Based on the medications you are taking and your history of anxiety, depression, bipolar, I recommend that you see a Neurologist.  We will check around to find a practice and reach out to you with that recommendation when available - I placed a referral for you for Vestibular/Balance Rehab.  You should contact them to schedule.  You can start this prior to seeing a neurologist - As discussed, you should not drive at this time - You should continue to follow-up with your Psychiatrist as scheduled - You should keep your Psychologist appointment as scheduled - You will follow-up with me on an as needed basis - Do not hesitate to reach out with any questions or concerns

## 2023-10-09 NOTE — Progress Notes (Signed)
DATE OF VISIT: 10/09/2023        Marvin Espinoza DOB: 04-06-1971 MRN: [id number]  CC: head injury  HPI:  I had the opportunity to evaluate, Marvin Espinoza, on 10/09/2023.   - accompanied by wife today who is helping with history   Marvin Espinoza, is a 52 y.o. year old male who is a Rt-hand dominant  Sustained a concussion/head injury 6 weeks ago after a slip and fall at [facility] on 08/31/23.   Has insurance claim with [employer] No lawsuit filed - Slipped on water in bathroom and hit head on the floor - Denies LOC at the time - Had immediate headache, needed to sit on the floor because was dizziness, (+)nausea, (+)vomiting EMS was called - needed help from EMS to sit up Went to the ER - [facility] - had CT head w/o contrast -- no acute brain injury, no cspine injury, mild lt-sided neural foraminal narrowing at C3-4 and C4-5 - also had CT Angio head/neck w/ & w/o contrast   Initial f/u with PCP office 09/03/23 with Marvin Burly, NP - given Rx Zofran prn for nausea - given Rx Meclizine prn dizziness  F/u with PCP office 09/11/23 - Marvin Burly, NP - ongoing symptoms - c/o of dizziness - given refill Meclizine - given referral to Behavior Health for mood d/o and PTSD  F/u with PCP office 09/18/23 - Marvin Burly, NP - notes slight improvement - refill meclizine given  Seen by PCP - Marvin Flavin, DO on 10/07/23 and was recommended concussion eval for ongoing dizziness with position changes.    Worse when laying down Recently trying to lay down and felt dizzy like was going to fall over when standing up No prior hx of vertigo Feels room spinning with movements Ongoing nausea  Patient does have possible hx of 1 prior concussion in 2015/2016 - was mowing the lawn, got hit in the head by board on picnic table, did have short period of LOC - was never formally evaluated - d/w Neurologist at a later date and told likely a concussion   PMH significant for ADHD,  adjustment d/o with anxious mood, generalized anxiety, mood d/o, severe major depression, affective psychosis/bipolar, PTSD, tardive dyskinesis, and hx of prior benzo overdose.   - sees Psychiatrist every 3 months -- next is Dec 2024 - seen by Psych the week of injury - thought has PTSD - scheduled to see Psychologist next week   Has not been driving - doing ok as passenger - no significant symptoms   Patient does wear glasses/contacts.   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 Six times per day prn, Disp: , Rfl:    Blood Glucose Monitoring Suppl (ONETOUCH VERIO) w/Device KIT, Use to test blood sugar daily as directed, Disp: 1 kit, Rfl: 0   calcium carbonate (OS-CAL) 1250 (500 Ca) MG chewable tablet, Chew  1 tablet by mouth daily., Disp: , Rfl:    Calcium-Vitamin D-Vitamin K (SM CALCIUM SOFT CHEWS PO), Take 1 application by mouth 2 (two) times daily. Chew calcium tabs twice a day.  (bariatric surgery), Disp: , Rfl:    docusate sodium (COLACE) 100 MG capsule, Take 100 mg by mouth 2 (two) times daily., Disp: , Rfl:    famotidine (PEPCID) 20 MG tablet, TAKE ONE (1) TABLET BY MOUTH TWO (2) TIMES DAILY, Disp: 180 tablet, Rfl: 0   Ferrous Sulfate 50 MG TBCR, Take by mouth., Disp: , Rfl:    FLUoxetine (PROZAC) 20 MG tablet, Take 20 mg by mouth daily., Disp: , Rfl:    gabapentin (NEURONTIN) 300 MG capsule, TAKE 1 CAPSULE BY MOUTH THREE TIMES A DAY, Disp: 270  capsule, Rfl: 3   glucose blood test strip, test blood sugar 2x daily Dx E11.9, Disp: 100 each, Rfl: 3   hydrOXYzine (VISTARIL) 25 MG capsule, Take 25 mg by mouth 3 (three) times daily., Disp: , Rfl:    lamoTRIgine (LAMICTAL) 200 MG tablet, Take 400 mg by mouth at bedtime. , Disp: , Rfl:    levothyroxine (SYNTHROID) 125 MCG tablet, Take 125 mcg by mouth daily before breakfast., Disp: , Rfl:    Magnesium Gluconate (MAGNESIUM 27 PO), Take by mouth., Disp: , Rfl:    meclizine (ANTIVERT) 25 MG tablet, Take 1 tablet (25 mg total) by mouth 3 (three) times daily as needed for dizziness., Disp: 90 tablet, Rfl: 0   metFORMIN (GLUCOPHAGE) 1000 MG tablet, Take 1 tablet (1,000 mg total) by mouth 2 (two) times daily with a meal., Disp: 180 tablet, Rfl: 3   Multiple Vitamin (MULTIVITAMIN WITH MINERALS) TABS tablet, Take 1 tablet by mouth 2 (two) times daily. For Vitamin supplementation (Patient taking differently: Take 1 tablet by mouth 2 (two) times daily. For Vitamin supplementation FROM GASTRIC BYPASS), Disp: , Rfl:    OLANZapine (ZYPREXA) 2.5 MG tablet, Take 2.5 mg by mouth at bedtime., Disp: , Rfl:    ondansetron (ZOFRAN-ODT) 4 MG disintegrating tablet, Take 1-2 tablets (4-8 mg total) by mouth every 8 (eight) hours as needed for nausea or vomiting., Disp: 30 tablet, Rfl: 0   OneTouch Delica Lancets 30G MISC, Use to test blood sugar daily as directed, Disp: 100 each, Rfl: 3   pantoprazole (PROTONIX) 40 MG tablet, Take 1 tablet (40 mg total) by mouth 2 (two) times daily., Disp: 180 tablet, Rfl: 3   promethazine (PHENERGAN) 12.5 MG tablet, TAKE 1 TO 2 TABLETS EVERY 8 HOURS AS NEEDED FOR NAUSEA AND VOMITING, Disp: 60 tablet, Rfl: 0   psyllium (METAMUCIL) 58.6 % powder, Take 1 packet by mouth 2 (two) times daily., Disp: , Rfl:    rosuvastatin (CRESTOR) 5 MG tablet, TAKE ONE (1) TABLET BY MOUTH EVERY DAY, Disp: 90 tablet, Rfl: 3   SOLIQUA 100-33 UNT-MCG/ML SOPN, INJECT 40-60 UNITS IN TO THE SKIN IN THEMORNING,  Disp: 15 mL, Rfl: 0   STEGLATRO 15 MG TABS tablet, TAKE 1 TABLET BY MOUTH EVERY MORNING BEFORE BREAKFAST, Disp: 90 tablet, Rfl: 0   tiZANidine (ZANAFLEX) 4 MG tablet, TAKE 1 TABLET BY MOUTH EVERY 8 HOURS AS NEEDED FOR MUSCLE SPASMS, Disp: 90 tablet, Rfl: 2   vitamin B-12 (CYANOCOBALAMIN) 250 MCG tablet, Take 1 tablet (250 mcg total) by mouth daily. For low B-12 supplementation, Disp: 1 tablet, Rfl:   ALLERGIES:   Allergies  Allergen Reactions   Abilify [Aripiprazole] Anaphylaxis and Swelling    "slurred speech" FACIAL DROOPING, TONGUE  SWELLING, TARDIVE DYSKENESIA    Caplyta [Lumateperone] Anaphylaxis   Pregabalin Shortness Of Breath   Seroquel [Quetiapine Fumarate]     tardive dyskinesia   Alcohol Itching    Drinking alcohol   Ambien [Zolpidem] Other (See Comments)    NIGHT TERRORS   Anacin-3 [Acetaminophen] Itching    REGULAR TYLENOL IS OKAY.  NEEDS BENADRYL TO TAKE THIS   Cariprazine Other (See Comments)    Tardive dyskenesia   Cariprazine Hcl Itching   Latuda [Lurasidone Hcl] Other (See Comments)    RUNS SUGAR TOO HIGH   Lurasidone Other (See Comments)    TARDIVE DYSKINESIA   Prozac [Fluoxetine] Itching   Victoza [Liraglutide] Other (See Comments)    Severe heartburn    Hydrocodone Other (See Comments)   Bupropion Rash   Hydrocodone-Acetaminophen Itching   Risperidone Rash   Tegretol [Carbamazepine] Rash    PAST MEDICAL HISTORY:  Past Medical History:  Diagnosis Date   Anxiety    Bipolar disorder (HCC)    CTS (carpal tunnel syndrome)    Depression    Diabetes mellitus without complication (HCC)    GERD (gastroesophageal reflux disease)    H/O bariatric surgery 2016   Head injury 05/06/2017   Hypothyroidism    Hypothyroidism    Sleep apnea    no longer with OSA d/t over 100 pd weight loss     PAST SURGICAL HISTORY:   Past Surgical History:  Procedure Laterality Date   ANAL FISSURE REPAIR     BARIATRIC SURGERY     CARPAL TUNNEL RELEASE Left 05/09/2015    Procedure: LEFT CARPAL TUNNEL RELEASE;  Surgeon: Cindee Salt, MD;  Location: [facility];  Service: Orthopedics;  Laterality: Left;   CARPAL TUNNEL RELEASE Left 05-09-2015   CARPAL TUNNEL RELEASE Right 06/20/2015   Procedure: RIGHT CARPAL TUNNEL RELEASE;  Surgeon: Cindee Salt, MD;  Location: [facility];  Service: Orthopedics;  Laterality: Right;  REGIONAL/FAB   CHOLECYSTECTOMY     COLONOSCOPY     UPPER GASTROINTESTINAL ENDOSCOPY      PAST SOCIAL HISTORY: Social History   Socioeconomic History   Marital status: Married    Spouse name: Marqual [place]ston   Number of children: 1   Years of education: 12   Highest education level: 12th grade  Occupational History   Not on file  Tobacco Use   Smoking status: Never    Passive exposure: Never   Smokeless tobacco: Never  Vaping Use   Vaping status: Never Used  Substance and Sexual Activity   Alcohol use: No    Comment: severe allergy to alcohol!!   Drug use: No   Sexual activity: Yes    Partners: Female  Other Topics Concern   Not on file  Social History Narrative   Lives with Wife, and one child   Two story   He stays there to keep an eye on the house.    His son comes to visit also.   Right handed.   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          Social Determinants of Health   Financial Resource Strain: Medium Risk (10/05/2023)   Overall Financial Resource Strain (CARDIA)    Difficulty of Paying Living Expenses: Somewhat hard  Food Insecurity: No Food Insecurity (10/05/2023)   Hunger Vital Sign    Worried About Running Out of Food in the Last Year: Never true    Ran Out of Food in the Last Year: Never true  Transportation Needs: No Transportation Needs (10/05/2023)   PRAPARE - Administrator, Civil Service (Medical): No    Lack of Transportation (Non-Medical): No  Physical Activity: Insufficiently Active (10/05/2023)   Exercise Vital Sign    Days of Exercise per Week: 1 day    Minutes of  Exercise per Session: 100 min  Stress: Stress Concern Present (10/05/2023)   [employer] of Occupational Health - Occupational Stress Questionnaire    Feeling of Stress : Rather much  Social Connections: Moderately Isolated (10/05/2023)   Social Connection and Isolation Panel [NHANES]    Frequency of Communication with Friends and Family: Once a week    Frequency of Social Gatherings with Friends and Family: Never    Attends Religious Services: Never    Database administrator or Organizations: No    Attends Engineer, structural: 1 to 4 times per year    Marital Status: Married  Catering manager Violence: Not At Risk (09/08/2023)   Humiliation, Afraid, Rape, and Kick questionnaire    Fear of Current or Ex-Partner: No    Emotionally Abused: No    Physically Abused: No    Sexually Abused: No    FAMILY HISTORY: His family history includes COPD in his father; Depression in his father, mother, sister, and son; Diabetes in his mother; Diabetes Mellitus II in his son; Esophageal cancer in his maternal uncle and mother; Heart disease in his father and mother; Hyperlipidemia in his son; Kidney disease in his father; Mental illness in his father and sister.   PHYSICAL  EXAMINATION There were no vitals taken for this visit. GENERAL: AOx3, no acute distress, sitting comfortable in exam room NECK: supple, No cervical lymphadenopathy HEENT: no scalp tenderness, PERRLA, EOMI - impaired visual tracking with horizontal nystagmus, slightly impaired smooth pursuits,  normal visual field testing CARDIO: peripheral pulses 2+ and symmetric bilaterally RESPIRATORY: normal respirations, unlabored, symmetric PSYCH: appropriate mood, answering questions appropriately, fair insight MSK: normal upper extremity and lower extremity strength bilaterally Cervical spine: no gross deformity, no midline or paraspinal tenderness, normal ROM.  NEURO: CN II-XII intact, no focal deficits Sensation intact  to light touch upper ext & lower ext bilaterally DTR +2/4 upper ext & lower ext bilaterally (+) Rhomberg Normal gait  SCAT 6 Score: Total symptoms: 21/22 Symptom score: 81/132  Headache    3/(0-6) "Pressure in head"  3/(0-6) Neck Pain   2/(0-6) Nausea or vomiting  2/(0-6) Dizziness   3/(0-6) Blurred vision   1/(0-6) Balance problems   6/(0-6) Sensitivity to light  5/(0-6) Sensitivity to noise  0/(0-6) Feeling slowed down  4/(0-6) Feeling "like in a fog"  4/(0-6) "Don't feel right"  5/(0-6) Difficulty concentrating  6/(0-6) Difficulty remembering 6/(0-6) Fatigue or low energy  5/(0-6) Confusion   5/(0-6) Drowsiness   1/(0-6) More emotional  1/(0-6) Irritability   4/(0-6) Sadness   4/(0-6) Nervous or Anxious  6/(0-6) Trouble falling asleep  5/(0-6)  Do the symptoms get worse with physical activity?  YES Do the symptoms get worse with mental activity?  YES            STANDARDIZED ASSESSMENT OF CONCUSSION [facility]): Orientation What month is it? 1. What is the date today? 1. What is the day of the week? 1. What year is it? 1. What time is it right now (within one hour)? 1. Orientation score: 5 /5  Immediate memory (3 trials) List A  List B  List C Jacket  Finger  Baby Arrow  Penny  Monkey Pepper  Blanket Perfume Cotton  Lemon  Sunset Movie  Insect  Iron Dollar  Candle  Elbow Honey  Paper  Radiation protection practitioner  Wagon  Bubble  First time:  3. Second time:  8. Third time:  7. Immediate memory score:  18/30.  Concentration (Digits Backward) 4-9-3   6-2-9   5-2-6   4-1-5:   1 3-8-1-4   3-2-7-9    1-7-9-5    4-9-6-8:   1 6-2-9-7-1   1-5-2-8-6   3-8-5-2-7   6-1-8-4-3:   1 7-1-8-4-6-2    5-3-9-1-4-8    8-3-1-9-6-4    7-2-4-8-5-6:   1  Months in reverse order:  0 - skipped August Dec-Nov-Oct-Sep-Aug-Jul-Jun-May-Apr-Mar-Feb-Jan (Time <30-secs -- 1 point if no errors under 30-secs) Concentration score:  4 /5  COORDINATION &  BALANCE EXAM Modified BESS (20 seconds):  Double leg firm ground:  5 Single leg firm ground:  > 10 Tandem firm ground:  > 10 MBESS Total Score:  25/30  Timed Tandem Gait (walk heel to toe x 3-meters, turn around & come back, as fast as you can) Trial 1: unable to perform due to poor balance  Delayed recall score:  4 /10  IMAGING: CT ANGIO HEAD/NECK W/ & W/O CONTRAST 08/31/23 at ER showing: IMPRESSION: Normal CTA of the head and neck  CT HEAD W/O CONTRAST + CT CSPINE W/O CONTRAST 08/31/23 at ER showing: IMPRESSION: 1. No acute brain injury. 2. No acute cervical spine injury. 3. Mild left-sided neural foraminal narrowing at the C3-4 and C4-5 levels.  CT HEAD W/O CONTRAST 01/25/20 showing: IMPRESSION: No acute intracranial abnormality.  CT HEAD W/O CONTRAST 06/14/19 showing: IMPRESSION: No acute intracranial abnormality.  MRI BRAIN W/ & W/O CONTRAST 09/20/2018 showing: IMPRESSION: Negative exam.  Assessment & Plan Concussion without loss of consciousness, initial encounter Concussion without loss of conciousness sustained 08/31/23 after slip & fall in [employer] bathroom.   - Feeling 25% improved - still with significant dizziness, balance issues, concentration and memory issues - concussion complicated by underlying hx oanxiety, depression, bipolar, PTSD and on multiple psychotropic medications.  These have been shown to lead to prolonged recovery and his underlying psychiatric conditions are likely slowing his recovery - likely 2nd lifetime concussion.     Plan: 1.  Diagnosis of concussion, treatment, and normal recovery reviewed.   2. Personally reviewed ER visit notes and visit notes with PCP over the last 6 weeks with findings as noted in HPI 3. IMAGING: reviewed recent and prior imaging as noted above.  Normal CT head and CT angio in the ER 08/31/23 4. Given significant symptoms and complicated psychiatric history, patient will be referred to Neurology for ongoing treatment  and management 5. Would benefit from Vestibular rehab - referral given.  Advised can start this prior to seeing Neurology 6. Should cont regular f/u with Psychiatry.  Agree with appointment with psychologist as well 7. Advised should not drive at this time until seen by Neurology 8. Discussed consideration of a cane for balance if needed 9. Patient and wife expressed understanding and agreement Encouraged to call with any questions or concerns Dizziness and giddiness S/p concussion w/o LOC 08/31/23 after slipping on water at [employer] - neg CT head, neg CT angio, neg CT Cspine - only dizziness with nystagmus due to vestibular dysfunction likely from concussion, but could be compounded by his underlying psychotropic medications  PLAN: IMAGING: reviewed recent and prior imaging as noted  above.  Normal CT head and CT angio in the ER 08/31/23 2. Given significant symptoms and complicated psychiatric history, patient will be referred to Neurology for ongoing treatment and management 3. Would benefit from Vestibular rehab - referral given.  Advised can start this prior to seeing Neurology 4. Should cont regular f/u with Psychiatry.  Agree with appointment with psychologist as well 5. Advised should not drive at this time until seen by Neurology  Postconcussive syndrome Concussion without loss of conciousness sustained 08/31/23 after slip & fall in [employer] bathroom.   - Feeling 25% improved - still with significant dizziness, balance issues, concentration and memory issues - concussion complicated by underlying hx oanxiety, depression, bipolar, PTSD and on multiple psychotropic medications.  These have been shown to lead to prolonged recovery and his underlying psychiatric conditions are likely slowing his recovery - likely 2nd lifetime concussion.     Plan: 1.  Diagnosis of concussion, treatment, and normal recovery reviewed.   2. Personally reviewed ER visit notes and visit notes with PCP over  the last 6 weeks with findings as noted in HPI 3. IMAGING: reviewed recent and prior imaging as noted above.  Normal CT head and CT angio in the ER 08/31/23 4. Given significant symptoms and complicated psychiatric history, patient will be referred to Neurology for ongoing treatment and management 5. Would benefit from Vestibular rehab - referral given.  Advised can start this prior to seeing Neurology 6. Should cont regular f/u with Psychiatry.  Agree with appointment with psychologist as well 7. Advised should not drive at this time until seen by Neurology 8. Discussed consideration of a cane for balance if needed 9. Patient and wife expressed understanding and agreement Encouraged to call with any questions or concerns  60 mins spent on encounter today including: review of previous notes as noted in HPI, review of imaging as noted above,  discussion of treatment options including: referral to vestibular rehab and neurology; and completing documentation.

## 2023-10-10 ENCOUNTER — Other Ambulatory Visit: Payer: Self-pay | Admitting: Family Medicine

## 2023-10-10 DIAGNOSIS — E1169 Type 2 diabetes mellitus with other specified complication: Secondary | ICD-10-CM
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
## 2023-10-16 ENCOUNTER — Ambulatory Visit: Payer: 59 | Attending: Family Medicine | Admitting: Physical Therapy

## 2023-10-16 ENCOUNTER — Other Ambulatory Visit: Payer: Self-pay

## 2023-10-16 ENCOUNTER — Encounter: Payer: Self-pay | Admitting: Physical Therapy

## 2023-10-16 DIAGNOSIS — R42 Dizziness and giddiness: Secondary | ICD-10-CM | POA: Insufficient documentation

## 2023-10-16 DIAGNOSIS — S060X0A Concussion without loss of consciousness, initial encounter: Secondary | ICD-10-CM | POA: Diagnosis not present

## 2023-10-16 DIAGNOSIS — R2681 Unsteadiness on feet: Secondary | ICD-10-CM | POA: Diagnosis not present

## 2023-10-16 DIAGNOSIS — M542 Cervicalgia: Secondary | ICD-10-CM | POA: Diagnosis not present

## 2023-10-17 DIAGNOSIS — F4312 Post-traumatic stress disorder, chronic: Secondary | ICD-10-CM | POA: Diagnosis not present

## 2023-10-20 ENCOUNTER — Ambulatory Visit: Payer: 59 | Admitting: Podiatry

## 2023-10-20 ENCOUNTER — Encounter: Payer: Self-pay | Admitting: Podiatry

## 2023-10-20 DIAGNOSIS — B351 Tinea unguium: Secondary | ICD-10-CM

## 2023-10-20 DIAGNOSIS — L603 Nail dystrophy: Secondary | ICD-10-CM | POA: Diagnosis not present

## 2023-10-20 NOTE — Progress Notes (Signed)
Subjective: Chief Complaint  Patient presents with   RFC    Patient states having trouble with bilateral big toe nails.   52 year old male presents with above concerns.  Since I saw him last and his liver function still elevated so we do not do any oral medication.  He has not been using topical medication either.  Last A1c 6.1 he states   Objective: AAO x3, NAD DP/PT pulses palpable bilaterally, CRT less than 3 seconds Bilateral hallux nails are hypertrophic, dystrophic.  The nails short and appears to be part of the nails on both sides.  There is no edema, erythema or signs of infection. Minimal callus on the right medial hallux.  No ulcerations present. No pain with calf compression, swelling, warmth, erythema  Assessment: Onychodystrophy  Plan: -All treatment options discussed with the patient including all alternatives, risks, complications.  -Less overall for any antifungal medication.  We discussed urea nail gel but also topical biotin.  He is not able to do oral biotin due to thyroid issues.  -Moisturizer for the callus. -Daily foot inspection. -Patient encouraged to call the office with any questions, concerns, change in symptoms.   Vivi Barrack DPM

## 2023-10-20 NOTE — Patient Instructions (Addendum)
You can use UREA NAIL GEL to help with the nails or topical BIOTIN.

## 2023-10-21 ENCOUNTER — Ambulatory Visit: Payer: 59 | Attending: Family Medicine | Admitting: Physical Therapy

## 2023-10-21 ENCOUNTER — Encounter: Payer: Self-pay | Admitting: Physical Therapy

## 2023-10-21 DIAGNOSIS — M542 Cervicalgia: Secondary | ICD-10-CM | POA: Insufficient documentation

## 2023-10-21 DIAGNOSIS — R42 Dizziness and giddiness: Secondary | ICD-10-CM | POA: Diagnosis not present

## 2023-10-21 DIAGNOSIS — R2681 Unsteadiness on feet: Secondary | ICD-10-CM | POA: Insufficient documentation

## 2023-10-21 NOTE — Therapy (Signed)
OUTPATIENT PHYSICAL THERAPY VESTIBULAR TREATMENT     Patient Name: Marvin Espinoza MRN: [id number] DOB:Nov 21, 1971, 52 y.o., male Today's Date: 10/21/2023  END OF SESSION:  PT End of Session - 10/21/23 0757     Visit Number 2    Number of Visits 17    Date for PT Re-Evaluation 12/11/23    Authorization Type [employer]    Authorization - Number of Visits 30    PT Start Time 0803    PT Stop Time 0847    PT Time Calculation (min) 44 min    Activity Tolerance Patient tolerated treatment well    Behavior During Therapy Flat affect              Past Medical History:  Diagnosis Date   Anxiety    Bipolar disorder (HCC)    CTS (carpal tunnel syndrome)    Depression    Diabetes mellitus without complication (HCC)    GERD (gastroesophageal reflux disease)    H/O bariatric surgery 2016   Head injury 05/06/2017   Hypothyroidism    Hypothyroidism    Sleep apnea    no longer with OSA d/t over 100 pd weight loss   Past Surgical History:  Procedure Laterality Date   ANAL FISSURE REPAIR     BARIATRIC SURGERY     CARPAL TUNNEL RELEASE Left 05/09/2015   Procedure: LEFT CARPAL TUNNEL RELEASE;  Surgeon: Cindee Salt, MD;  Location: [facility];  Service: Orthopedics;  Laterality: Left;   CARPAL TUNNEL RELEASE Left 05-09-2015   CARPAL TUNNEL RELEASE Right 06/20/2015   Procedure: RIGHT CARPAL TUNNEL RELEASE;  Surgeon: Cindee Salt, MD;  Location: [facility];  Service: Orthopedics;  Laterality: Right;  REGIONAL/FAB   CHOLECYSTECTOMY     COLONOSCOPY     UPPER GASTROINTESTINAL ENDOSCOPY     Patient Active Problem List   Diagnosis Date Noted   Burning sensation of skin 08/22/2022   Diabetes mellitus type II, controlled (HCC) 08/01/2021   Chronic constipation 12/29/2020   History of gastric surgery 03/21/2020   Overdose of benzodiazepine, intentional self-harm, initial encounter (HCC) 01/26/2020   MDD (major depressive disorder), recurrent severe, without psychosis  (HCC) 01/26/2020   Drug overdose 01/25/2020   Onychomycosis 01/05/2019   Neuropathy 01/05/2019   Severe episode of recurrent major depressive disorder, without psychotic features (HCC) 10/04/2018   OSA (obstructive sleep apnea) 09/07/2018   Bilateral hip pain 10/24/2017   Shoulder pain, bilateral 10/24/2017   Arthritis 10/24/2017   Osteoarthritis of both hips 10/24/2017   Avascular necrosis of bone of hip, right (HCC) 10/24/2017   Osteoarthritis of shoulder 10/24/2017   PTSD (post-traumatic stress disorder) 08/20/2017   Tardive dyskinesia 08/20/2017   Severe major depression (HCC) 05/10/2017   Affective psychosis, bipolar (HCC) 05/10/2017   Vision disturbance 05/06/2017   History of concussion 02/10/2017   Mood disorder (HCC) 02/10/2017   Adjustment insomnia 01/24/2017   Adjustment disorder with anxious mood 01/24/2017   GAD (generalized anxiety disorder) 01/24/2017   Gastroesophageal reflux disease without esophagitis 11/19/2016   Hypothyroidism 11/19/2016   DDD (degenerative disc disease), lumbar 11/19/2016    PCP:    Raliegh Ip, DO   REFERRING PROVIDER: Andi Devon, DO  REFERRING DIAG: C78.9F8B (ICD-10-CM) - Concussion without loss of consciousness, initial encounter R42 (ICD-10-CM) - Dizziness and giddiness  THERAPY DIAG:  Dizziness and giddiness  Cervicalgia  Unsteadiness on feet  ONSET DATE: 08/31/23  Rationale for Evaluation and Treatment: Rehabilitation  SUBJECTIVE:   SUBJECTIVE STATEMENT:  I still get dizzy (spinning/foggy) laying to sitting up and sitting up to standing.  Sometimes walk to the left or right.    Pt accompanied by: self and significant other  PERTINENT HISTORY: Anxiety, bipolar disorder, CTS, depression, DM, head injury 2018 Per Dr. Christella Hartigan' note: "only dizziness with nystagmus due to vestibular dysfunction likely from concussion, but could be compounded by his underlying psychotropic medications"   PAIN:  Are you having  pain? Yes: NPRS scale: 0-5-6/10 Pain location: R posterior neck Pain description: sharp Aggravating factors: neck flexion, R sidebending  Relieving factors: meds, not taking any more  PRECAUTIONS: None  RED FLAGS: None   WEIGHT BEARING RESTRICTIONS: No  FALLS: Has patient fallen in last 6 months? Yes. Number of falls only 1 causing concussion   LIVING ENVIRONMENT: Lives with: lives with their spouse Lives in: House/apartment Stairs:  2 steps to enter 2 story home but live on ground floor Has following equipment at home: Single point cane and Environmental consultant - 2 wheeled  PLOF: Independent; was working part time mowing and car detailing; currently on medical leave  PATIENT GOALS: improve dizziness   OBJECTIVE:    TODAY'S TREATMENT: 10/21/2023 Activity Comments  FGA 16/30 Scores <22/30 indicate increased fall risk  R DH Nystagmus noted 15 seconds, feels foggy upon sitting up  L DH No nystagmus, no symptoms  R roll Apogeotrophic nystagmus, approx 60 sec, feels foggy  R DH Verge of nystagmus, not as dizzy as first time  R Epley Tolerates well; feels dizzy upon standing    [facility] PT Assessment - 10/21/23 0808       Functional Gait  Assessment   Gait assessed  Yes    Gait Level Surface Walks 20 ft, slow speed, abnormal gait pattern, evidence for imbalance or deviates 10-15 in outside of the 12 in walkway width. Requires more than 7 sec to ambulate 20 ft.   11.27 sec   Change in Gait Speed Makes only minor adjustments to walking speed, or accomplishes a change in speed with significant gait deviations, deviates 10-15 in outside the 12 in walkway width, or changes speed but loses balance but is able to recover and continue walking.    Gait with Horizontal Head Turns Performs head turns with moderate changes in gait velocity, slows down, deviates 10-15 in outside 12 in walkway width but recovers, can continue to walk.   14.56 sec   Gait with Vertical Head Turns Performs task with moderate  change in gait velocity, slows down, deviates 10-15 in outside 12 in walkway width but recovers, can continue to walk.   12.56 sec   Gait and Pivot Turn Pivot turns safely in greater than 3 sec and stops with no loss of balance, or pivot turns safely within 3 sec and stops with mild imbalance, requires small steps to catch balance.   3.37   Step Over Obstacle Is able to step over one shoe box (4.5 in total height) without changing gait speed. No evidence of imbalance.    Gait with Narrow Base of Support Is able to ambulate for 10 steps heel to toe with no staggering.    Gait with Eyes Closed Cannot walk 20 ft without assistance, severe gait deviations or imbalance, deviates greater than 15 in outside 12 in walkway width or will not attempt task.   veers to L   Ambulating Backwards Walks 20 ft, uses assistive device, slower speed, mild gait deviations, deviates 6-10 in outside 12 in walkway width.  20.87   Steps Alternating feet, no rail.    Total Score 16    FGA comment: Scores <22/30 indicate increased fall risk              ------------------------------------------------------------------ Note: Objective measures below were completed at Evaluation unless otherwise noted.  DIAGNOSTIC FINDINGS: 08/31/23 head/neck CT/angio: Normal CTA of the head and neck. No acute brain injury. 2. No acute cervical spine injury. 3. Mild left-sided neural foraminal narrowing at the C3-4 and C4-5 levels.  COGNITION: Overall cognitive status: Within functional limits for tasks assessed   SENSATION: Denies N/T  POSTURE:  rounded shoulders and forward head  PALPATION: soft tissue restriction and TTP over R>L splenius, LS, suboccipitals   Cervical ROM:    Active AROM (deg) eval  Flexion 32 *pain  Extension 33 *pain   Right lateral flexion 38   Left lateral flexion 35 *pain  Right rotation 46 *pain   Left rotation 59  (Blank rows = not tested)  GAIT: Gait pattern: narrow BOS, guarded, LOB  and veering while walking  Assistive device utilized: None Level of assistance: SBA  PATIENT SURVEYS:  FOTO [phone]  VESTIBULAR ASSESSMENT:  GENERAL OBSERVATION: pt wears bifocals or progressive lenses   OCULOMOTOR EXAM: *pt blinking repeatedly throughout oculomotor testing and reports sensation of eye movement- difficult to discern/visualize if nystagmus is actually occurring   Ocular Alignment: normal  Ocular ROM: No Limitations  Spontaneous Nystagmus: absent  Gaze-Induced Nystagmus: absent  Smooth Pursuits: saccades  Saccades:  slow and poor trajectory   Convergence/Divergence: reports clear throughout   VESTIBULAR - OCULAR REFLEX:   Slow VOR: Normal reduced control of neck movement    VOR Cancellation: Corrective Saccades to L and c/o "fuzzy"  Head-Impulse Test: HIT Right: negative HIT Left: positive     VESTIBULAR TREATMENT:                                                                                                   DATE: 10/16/23  PATIENT EDUCATION: Education details: prognosis, POC, HEP, advised to speak to MD about possibility of discontinuation of Meclizine for max vestibular recovery Person educated: Patient and Spouse Education method: Explanation, Demonstration, Tactile cues, Verbal cues, and Handouts Education comprehension: verbalized understanding and returned demonstration  HOME EXERCISE PROGRAM: Access Code: [email or web address] URL: [email or web address] Date: 10/16/2023 Prepared by: [facility] - Outpatient  Rehab - [facility]  Program Notes walk inside the house 5-10 minutes 2x/day  Exercises - Seated Shoulder Shrug Circles AROM Backward  - 1 x daily - 5 x weekly - 2 sets - 10 reps - Seated Shoulder Shrug Circles AROM Forward  - 1 x daily - 5 x weekly - 2 sets - 10 reps - Gentle Levator Scapulae Stretch  - 1 x daily - 5 x weekly - 2 sets - 20 sec hold - Seated Cervical Rotation AROM  - 1 x daily - 5 x weekly - 2 sets - 10  reps  GOALS: Goals reviewed with patient? Yes  SHORT TERM GOALS: Target date: 11/06/2023  Patient to be independent with initial HEP. Baseline: HEP  initiated Goal status: IN PROGRESS    LONG TERM GOALS: Target date: 12/11/2023  Patient to be independent with advanced HEP. 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  Baseline: Unable Goal status: IN PROGRESS  Patient will report 0/10 dizziness with bed mobility and transfers.  Baseline: Symptomatic  Goal status: IN PROGRESS  Patient to score at least 23/30 on FGA in order to decrease risk of falls. Baseline: NT Goal status: IN PROGRESS  Patient to report resolution of neck pain,.  Baseline: - Goal status: IN PROGRESS  Patient to score at least 59 on FOTO in order to indicate improved functional outcomes.  Baseline: 48 Goal status: IN PROGRESS    ASSESSMENT:  CLINICAL IMPRESSION: Pt presents today with reports of dizziness and fogginess with supine<>sit and sit to stand as well as pulling sensation to either R or L.  FGA score 16/24 indicates increased fall risk.  Completed positional testing, with pt experiencing some dizziness in all positions.  He works to focus eyes in each position, but reports spinning with R DH (nystagmus noted) and fogginess with L roll (nystagmus noted).  Seemed worst with R DH, so treated with R Epley.  Pt tolerates well and reports dizziness upon return to sit.  Will need to reassess for positional vertigo and then treat with repositioning and or habituation.    OBJECTIVE IMPAIRMENTS: Abnormal gait, decreased activity tolerance, decreased balance, decreased ROM, dizziness, increased muscle spasms, impaired flexibility, postural dysfunction, and pain.   ACTIVITY LIMITATIONS: carrying, lifting, bending, sitting, standing, squatting, sleeping, stairs, transfers, bed mobility, bathing, toileting,  dressing, reach over head, hygiene/grooming, and locomotion level  PARTICIPATION LIMITATIONS: meal prep, cleaning, laundry, driving, shopping, community activity, occupation, yard work, and church  PERSONAL FACTORS: Age, Behavior pattern, Past/current experiences, Time since onset of injury/illness/exacerbation, and 3+ comorbidities: Anxiety, bipolar disorder, CTS, depression, DM, head injury 2018  are also affecting patient's functional outcome.   REHAB POTENTIAL: Good  CLINICAL DECISION MAKING: Evolving/moderate complexity  EVALUATION COMPLEXITY: Moderate   PLAN:  PT FREQUENCY: 1-2x/week  PT DURATION: 8 weeks  PLANNED INTERVENTIONS: 97164- PT Re-evaluation, 97110-Therapeutic exercises, 97530- Therapeutic activity, [id number]- Neuromuscular re-education, 97535- Self Care, 82956- Manual therapy, [id number]- Gait training, [phone]- Canalith repositioning, [id number]- Aquatic Therapy, [phone]- Electrical stimulation (manual), Patient/Family education, Balance training, Stair training, Taping, Dry Needling, Joint mobilization, Spinal mobilization, Vestibular training, Cryotherapy, and Moist heat  PLAN FOR NEXT SESSION: Repeat positional testing and treat as appropriate.  Add balance and dizziness exercises into HEP, continue working on neck pain and encourage gentle movement    Lonia Blood, PT 10/21/23 8:51 AM Phone: [phone] Fax: [phone]  [facility] at [facility] [address], [address] [place], [place] [address] Phone # [phone] Fax # [phone]

## 2023-10-21 NOTE — Progress Notes (Unsigned)
Subjective:   I, [name], PhD, LAT, ATC acting as a scribe for [name], MD.  Chief Complaint: Marvin Espinoza,  is a 52 y.o. male who presents for initial evaluation of a head injury. Pt slipped on some water in a [employer] bathroom, hitting his head on the floor. He was transported by EMS to the [facility] ED. His care was previously managed by [facility], his PCP, and [facility]. He is already working on vestibular therapy at [facility], completing 2 visits.  Today, pt reports ***  Today, pt c/o cont'd ***  Treatments tried: meclizine, zofran,   Dx imaging: 08/31/23 Head, C-spine, L-spine, & head/neck angio CT and R elbow XR  Injury date: 08/31/23 Visit #: 1  History of Present Illness:   Concussion Self-Reported Symptom Score Symptoms rated on a scale 1-6, in last 24 hours  Headache: ***   Pressure in head: *** Neck pain: *** Nausea or vomiting: *** Dizziness: ***  Blurred vision: ***  Balance problems: *** Sensitivity to light:  *** Sensitivity to noise: *** Feeling slowed down: *** Feeling like "in a fog": *** "Don't feel right": *** Difficulty concentrating: *** Difficulty remembering: *** Fatigue or low energy: *** Confusion: *** Drowsiness: *** More emotional: *** Irritability: *** Sadness: *** Nervous or anxious: *** Trouble falling asleep: ***   Total # of Symptoms: ***/22 Total Symptom Score: ***/132  Tinnitus: Yes/No***  Review of Systems:  ***    Review of History: ***  Objective:    Physical Examination There were no vitals filed for this visit. MSK:  *** Neuro: *** Psych: ***     Imaging:  ***  Assessment and Plan   52 y.o. male with ***    ***    Action/Discussion: Reviewed diagnosis, management options, expected outcomes, and the reasons for scheduled and emergent follow-up. Questions were adequately answered. Patient expressed verbal understanding and agreement  with the following plan.     Patient Education: Reviewed with patient the risks (i.e, a repeat concussion, post-concussion syndrome, second-impact syndrome) of returning to play prior to complete resolution, and thoroughly reviewed the signs and symptoms of concussion.Reviewed need for complete resolution of all symptoms, with rest AND exertion, prior to return to play. Reviewed red flags for urgent medical evaluation: worsening symptoms, nausea/vomiting, intractable headache, musculoskeletal changes, focal neurological deficits. [facility] Concussion Care Plan, which clearly outlines the plans stated above, was given to patient.   Level of service: ***     After Visit Summary printed out and provided to patient as appropriate.  The above documentation has been reviewed and is accurate and complete Adron Bene

## 2023-10-22 ENCOUNTER — Encounter: Payer: Self-pay | Admitting: Family Medicine

## 2023-10-22 ENCOUNTER — Ambulatory Visit (INDEPENDENT_AMBULATORY_CARE_PROVIDER_SITE_OTHER): Payer: 59 | Admitting: Family Medicine

## 2023-10-22 VITALS — BP 130/80 | HR 67 | Ht 70.0 in | Wt 267.0 lb

## 2023-10-22 DIAGNOSIS — R42 Dizziness and giddiness: Secondary | ICD-10-CM

## 2023-10-22 DIAGNOSIS — S060X0A Concussion without loss of consciousness, initial encounter: Secondary | ICD-10-CM | POA: Diagnosis not present

## 2023-10-22 DIAGNOSIS — F315 Bipolar disorder, current episode depressed, severe, with psychotic features: Secondary | ICD-10-CM

## 2023-10-22 DIAGNOSIS — F4310 Post-traumatic stress disorder, unspecified: Secondary | ICD-10-CM | POA: Diagnosis not present

## 2023-10-22 DIAGNOSIS — H539 Unspecified visual disturbance: Secondary | ICD-10-CM

## 2023-10-22 HISTORY — DX: Concussion without loss of consciousness, initial encounter: S06.0X0A

## 2023-10-22 NOTE — Therapy (Signed)
OUTPATIENT PHYSICAL THERAPY VESTIBULAR TREATMENT     Patient Name: Marvin Espinoza MRN: [id number] DOB:1971/06/10, 52 y.o., male Today's Date: 10/23/2023  END OF SESSION:  PT End of Session - 10/23/23 0845     Visit Number 3    Number of Visits 17    Date for PT Re-Evaluation 12/11/23    Authorization Type [employer]    Authorization - Number of Visits 30    PT Start Time 0800    PT Stop Time 0843    PT Time Calculation (min) 43 min    Activity Tolerance Patient tolerated treatment well    Behavior During Therapy [facility] for tasks assessed/performed               Past Medical History:  Diagnosis Date   Anxiety    Bipolar disorder (HCC)    CTS (carpal tunnel syndrome)    Depression    Diabetes mellitus without complication (HCC)    GERD (gastroesophageal reflux disease)    H/O bariatric surgery 2016   Head injury 05/06/2017   Hypothyroidism    Hypothyroidism    Sleep apnea    no longer with OSA d/t over 100 pd weight loss   Past Surgical History:  Procedure Laterality Date   ANAL FISSURE REPAIR     BARIATRIC SURGERY     CARPAL TUNNEL RELEASE Left 05/09/2015   Procedure: LEFT CARPAL TUNNEL RELEASE;  Surgeon: Cindee Salt, MD;  Location: [facility];  Service: Orthopedics;  Laterality: Left;   CARPAL TUNNEL RELEASE Left 05-09-2015   CARPAL TUNNEL RELEASE Right 06/20/2015   Procedure: RIGHT CARPAL TUNNEL RELEASE;  Surgeon: Cindee Salt, MD;  Location: [facility];  Service: Orthopedics;  Laterality: Right;  REGIONAL/FAB   CHOLECYSTECTOMY     COLONOSCOPY     UPPER GASTROINTESTINAL ENDOSCOPY     Patient Active Problem List   Diagnosis Date Noted   Concussion without loss of consciousness 10/22/2023   Burning sensation of skin 08/22/2022   Diabetes mellitus type II, controlled (HCC) 08/01/2021   Chronic constipation 12/29/2020   History of gastric surgery 03/21/2020   Overdose of benzodiazepine, intentional self-harm, initial encounter (HCC)  01/26/2020   MDD (major depressive disorder), recurrent severe, without psychosis (HCC) 01/26/2020   Drug overdose 01/25/2020   Onychomycosis 01/05/2019   Neuropathy 01/05/2019   Severe episode of recurrent major depressive disorder, without psychotic features (HCC) 10/04/2018   OSA (obstructive sleep apnea) 09/07/2018   Bilateral hip pain 10/24/2017   Shoulder pain, bilateral 10/24/2017   Arthritis 10/24/2017   Osteoarthritis of both hips 10/24/2017   Avascular necrosis of bone of hip, right (HCC) 10/24/2017   Osteoarthritis of shoulder 10/24/2017   PTSD (post-traumatic stress disorder) 08/20/2017   Tardive dyskinesia 08/20/2017   Severe major depression (HCC) 05/10/2017   Affective psychosis, bipolar (HCC) 05/10/2017   Vision disturbance 05/06/2017   History of concussion 02/10/2017   Mood disorder (HCC) 02/10/2017   Adjustment insomnia 01/24/2017   Adjustment disorder with anxious mood 01/24/2017   GAD (generalized anxiety disorder) 01/24/2017   Gastroesophageal reflux disease without esophagitis 11/19/2016   Hypothyroidism 11/19/2016   DDD (degenerative disc disease), lumbar 11/19/2016    PCP:    Raliegh Ip, DO   REFERRING PROVIDER: Andi Devon, DO  REFERRING DIAG: L87.5I4P (ICD-10-CM) - Concussion without loss of consciousness, initial encounter R42 (ICD-10-CM) - Dizziness and giddiness  THERAPY DIAG:  Dizziness and giddiness  Cervicalgia  Unsteadiness on feet  ONSET DATE: 08/31/23  Rationale  for Evaluation and Treatment: Rehabilitation  SUBJECTIVE:   SUBJECTIVE STATEMENT: Still dizzy when I get up, quick head turns. Still stumbling. Denies change in symptoms since CRM last session. Neck is not feeling good.   Pt accompanied by: self and significant other  PERTINENT HISTORY: Anxiety, bipolar disorder, CTS, depression, DM, head injury 2018 Per Dr. Christella Hartigan' note: "only dizziness with nystagmus due to vestibular dysfunction likely from concussion,  but could be compounded by his underlying psychotropic medications"   PAIN:  Are you having pain? Yes: NPRS scale: 0-8/10 Pain location: R posterior neck and down the shoulder blade Pain description: sharp Aggravating factors: neck flexion, R sidebending  Relieving factors: meds, not taking any more  PRECAUTIONS: None  RED FLAGS: None   WEIGHT BEARING RESTRICTIONS: No  FALLS: Has patient fallen in last 6 months? Yes. Number of falls only 1 causing concussion   LIVING ENVIRONMENT: Lives with: lives with their spouse Lives in: House/apartment Stairs:  2 steps to enter 2 story home but live on ground floor Has following equipment at home: Single point cane and Environmental consultant - 2 wheeled  PLOF: Independent; was working part time mowing and car detailing; currently on medical leave  PATIENT GOALS: improve dizziness   OBJECTIVE:     TODAY'S TREATMENT: 10/23/23 Activity Comments  R DH R upbeating torsional nystagmus lasting ~30 sec, saccadic intrusions/oscillations evident after nystagmus resolved  R Epley  Tolerated well; dizziness in position 3; saccadic intrusions/oscillations evident throughout   R DH Latent onset of R upbeating torsional nystagmus lasting ~40 sec; saccadic intrusions/oscillations evident after nystagmus resolved. Symptoms seems less intense this trial   R epley  Tolerated well   L brandt daroff L upbeating torsional nystagmus lasting ~15 sec; c/o more dizziness in this position  R brandt daroff  Smaller amplitude R upbeating torsional nystagmus lasting ~20 sec   Practiced and edu on sitting head turns/nods for vestibular challenge rather than neck stretch           HOME EXERCISE PROGRAM Last updated: 10/23/23 Access Code: [email or web address] URL: [email or web address] Date: 10/23/2023 Prepared by: [facility] - Outpatient  Rehab - [facility]  Program Notes walk inside the house 5-10 minutes 2x/day  Exercises - Seated Shoulder Shrug Circles AROM  Backward  - 1 x daily - 5 x weekly - 2 sets - 10 reps - Seated Shoulder Shrug Circles AROM Forward  - 1 x daily - 5 x weekly - 2 sets - 10 reps - Gentle Levator Scapulae Stretch  - 1 x daily - 5 x weekly - 2 sets - 20 sec hold - Seated Cervical Rotation AROM  - 1 x daily - 5 x weekly - 2 sets - 10 reps - Brandt-Daroff Vestibular Exercise  - 1 x daily - 5 x weekly - 2 sets - 3-5 reps - Seated Head Nod  - 1 x daily - 5 x weekly - 2 sets - 10 reps - Seated Right Head Turns Vestibular Habituation  - 1 x daily - 5 x weekly - 2 sets - 10 reps   PATIENT EDUCATION: Education details: edu on BPPV and recommended treatment, HEP update and edu on intended level of sx  Person educated: Patient and Spouse Education method: Explanation, Demonstration, Tactile cues, Verbal cues, and Handouts Education comprehension: verbalized understanding and returned demonstration    ------------------------------------------------------------------ Note: Objective measures below were completed at Evaluation unless otherwise noted.  DIAGNOSTIC FINDINGS: 08/31/23 head/neck CT/angio: Normal CTA of the head and neck. No acute brain  injury. 2. No acute cervical spine injury. 3. Mild left-sided neural foraminal narrowing at the C3-4 and C4-5 levels.  COGNITION: Overall cognitive status: Within functional limits for tasks assessed   SENSATION: Denies N/T  POSTURE:  rounded shoulders and forward head  PALPATION: soft tissue restriction and TTP over R>L splenius, LS, suboccipitals   Cervical ROM:    Active AROM (deg) eval  Flexion 32 *pain  Extension 33 *pain   Right lateral flexion 38   Left lateral flexion 35 *pain  Right rotation 46 *pain   Left rotation 59  (Blank rows = not tested)  GAIT: Gait pattern: narrow BOS, guarded, LOB and veering while walking  Assistive device utilized: None Level of assistance: SBA  PATIENT SURVEYS:  FOTO [phone]  VESTIBULAR ASSESSMENT:  GENERAL OBSERVATION: pt  wears bifocals or progressive lenses   OCULOMOTOR EXAM: *pt blinking repeatedly throughout oculomotor testing and reports sensation of eye movement- difficult to discern/visualize if nystagmus is actually occurring   Ocular Alignment: normal  Ocular ROM: No Limitations  Spontaneous Nystagmus: absent  Gaze-Induced Nystagmus: absent  Smooth Pursuits: saccades  Saccades:  slow and poor trajectory   Convergence/Divergence: reports clear throughout   VESTIBULAR - OCULAR REFLEX:   Slow VOR: Normal reduced control of neck movement    VOR Cancellation: Corrective Saccades to L and c/o "fuzzy"  Head-Impulse Test: HIT Right: negative HIT Left: positive     VESTIBULAR TREATMENT:                                                                                                   DATE: 10/16/23  PATIENT EDUCATION: Education details: prognosis, POC, HEP, advised to speak to MD about possibility of discontinuation of Meclizine for max vestibular recovery Person educated: Patient and Spouse Education method: Explanation, Demonstration, Tactile cues, Verbal cues, and Handouts Education comprehension: verbalized understanding and returned demonstration  HOME EXERCISE PROGRAM: Access Code: [email or web address] URL: [email or web address] Date: 10/16/2023 Prepared by: [facility] - Outpatient  Rehab - [facility]  Program Notes walk inside the house 5-10 minutes 2x/day  Exercises - Seated Shoulder Shrug Circles AROM Backward  - 1 x daily - 5 x weekly - 2 sets - 10 reps - Seated Shoulder Shrug Circles AROM Forward  - 1 x daily - 5 x weekly - 2 sets - 10 reps - Gentle Levator Scapulae Stretch  - 1 x daily - 5 x weekly - 2 sets - 20 sec hold - Seated Cervical Rotation AROM  - 1 x daily - 5 x weekly - 2 sets - 10 reps  GOALS: Goals reviewed with patient? Yes  SHORT TERM GOALS: Target date: 11/06/2023  Patient to be independent with initial HEP. Baseline: HEP initiated Goal status: IN  PROGRESS    LONG TERM GOALS: Target date: 12/11/2023  Patient to be independent with advanced HEP. 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  Baseline: Unable Goal status: IN PROGRESS  Patient will report 0/10 dizziness with bed mobility and transfers.  Baseline: Symptomatic  Goal status: IN PROGRESS  Patient to score at least 23/30 on FGA in order to decrease risk of falls. Baseline: NT Goal status: IN PROGRESS  Patient to report resolution of neck pain,.  Baseline: - Goal status: IN PROGRESS  Patient to score at least 59 on FOTO in order to indicate improved functional outcomes.  Baseline: 48 Goal status: IN PROGRESS    ASSESSMENT:  CLINICAL IMPRESSION: Patient arrived to session with report of no change in sx. Positional testing revealed remaining R posterior canalithiasis, treated with R Epley x2 with some improvement in symptoms. Educated on habituation to address this, which revealed L posterior canalithiasis. Plan to tx this next session. Patient and wife reported understanding od HEP update. Pt demonstrated some remaining unsteadiness at end of session but noted no increase in dizziness.  OBJECTIVE IMPAIRMENTS: Abnormal gait, decreased activity tolerance, decreased balance, decreased ROM, dizziness, increased muscle spasms, impaired flexibility, postural dysfunction, and pain.   ACTIVITY LIMITATIONS: carrying, lifting, bending, sitting, standing, squatting, sleeping, stairs, transfers, bed mobility, bathing, toileting, dressing, reach over head, hygiene/grooming, and locomotion level  PARTICIPATION LIMITATIONS: meal prep, cleaning, laundry, driving, shopping, community activity, occupation, yard work, and church  PERSONAL FACTORS: Age, Behavior pattern, Past/current experiences, Time since onset of injury/illness/exacerbation, and 3+ comorbidities: Anxiety, bipolar  disorder, CTS, depression, DM, head injury 2018  are also affecting patient's functional outcome.   REHAB POTENTIAL: Good  CLINICAL DECISION MAKING: Evolving/moderate complexity  EVALUATION COMPLEXITY: Moderate   PLAN:  PT FREQUENCY: 1-2x/week  PT DURATION: 8 weeks  PLANNED INTERVENTIONS: 97164- PT Re-evaluation, 97110-Therapeutic exercises, 97530- Therapeutic activity, [id number]- Neuromuscular re-education, 97535- Self Care, 82956- Manual therapy, [id number]- Gait training, [phone]- Canalith repositioning, [id number]- Aquatic Therapy, [id number]- Electrical stimulation (manual), Patient/Family education, Balance training, Stair training, Taping, Dry Needling, Joint mobilization, Spinal mobilization, Vestibular training, Cryotherapy, and Moist heat  PLAN FOR NEXT SESSION: Repeat positional testing and treat as appropriate.  Add balance and dizziness exercises into HEP, continue working on neck pain and encourage gentle movement    Anette Guarneri, PT, DPT 10/23/23 8:46 AM  [facility] at [facility] [address] [place], [address] [place], [place] [address] Phone # [phone] Fax # [phone]

## 2023-10-22 NOTE — Patient Instructions (Signed)
Thank you for coming in today.   Continue current treatment.   Recheck with me in 2 weeks.   I will send a letter to Dr Evelene Croon and we will do a release of information form.  I will consider ADHD medicines if we need to.

## 2023-10-23 ENCOUNTER — Ambulatory Visit: Payer: Self-pay | Admitting: *Deleted

## 2023-10-23 ENCOUNTER — Encounter: Payer: Self-pay | Admitting: Physical Therapy

## 2023-10-23 ENCOUNTER — Ambulatory Visit: Payer: 59 | Admitting: Physical Therapy

## 2023-10-23 DIAGNOSIS — R2681 Unsteadiness on feet: Secondary | ICD-10-CM | POA: Diagnosis not present

## 2023-10-23 DIAGNOSIS — R42 Dizziness and giddiness: Secondary | ICD-10-CM

## 2023-10-23 DIAGNOSIS — M542 Cervicalgia: Secondary | ICD-10-CM | POA: Diagnosis not present

## 2023-10-23 NOTE — Patient Instructions (Signed)
Visit Information  Thank you for taking time to visit with me today. Please don't hesitate to contact me if I can be of assistance to you.   Following are the goals we discussed today:   Goals Addressed               This Visit's Progress     Reduce & Manage Symptoms of Anxiety & Depression. (pt-stated)   On track     Care Coordination Interventions:  Interventions Today    Flowsheet Row Most Recent Value  Chronic Disease   Chronic disease during today's visit Diabetes, Other  [Adjustment Disorder w/Anxious Mood, Adjustment Insomnia, Bipolar Affective Psychosis, Drug Overdose, Generalized Anxiety Disorder, History of Concussion, Recurrent Severe Major Depressive Disorder, Mood Disorder, PTSD, Neuropathy, Tardive Dyskinesia]  General Interventions   General Interventions Discussed/Reviewed General Interventions Discussed, Doctor Visits, Durable Medical Equipment (DME), General Interventions Reviewed, Annual Eye Exam, Labs, Annual Foot Exam, Vaccines, Health Screening, Community Resources, Communication with  [Communication with Care Team Members]  Labs Hgb A1c every 3 months, Kidney Function  [Encouraged]  Vaccines COVID-19, Flu, Pneumonia, RSV, Shingles, Tetanus/Pertussis/Diphtheria  [Encouraged]  Doctor Visits Discussed/Reviewed Doctor Visits Discussed, Doctor Visits Reviewed, Annual Wellness Visits, PCP, Specialist  [Encouraged Collaboration]  Health Screening Bone Density, Colonoscopy, Prostate  [Encouraged]  Durable Medical Equipment (DME) Glucomoter, BP Cuff, Other  [Prescription Eyeglasses, Hand-Held Shower Hose, Engineer, materials in Air traffic controller, Cane]  PCP/Specialist Visits Compliance with follow-up visit  [Encouraged]  Communication with PCP/Specialists, Charity fundraiser, Pharmacists, Social Work  [Encouraged]  Exercise Interventions   Exercise Discussed/Reviewed Exercise Discussed, Exercise Reviewed, Physical Activity, Weight Managment, Assistive device use and maintanence  [Encouraged]  Physical  Activity Discussed/Reviewed Physical Activity Discussed, Physical Activity Reviewed, Types of exercise, Home Exercise Program (HEP), PREP, Gym  [Encouraged]  Weight Management Weight loss  [Encouraged]  Education Interventions   Education Provided Provided Therapist, sports, Provided Web-based Education, Provided Education  Provided Engineer, petroleum On Nutrition, Exercise, Medication, Foot Care, Eye Care, When to see the doctor, Labs, Blood Sugar Monitoring, Mental Health/Coping with Illness, Applications, [employer], [employer]  Labs Reviewed Hgb A1c  Mental Health Interventions   Mental Health Discussed/Reviewed Mental Health Discussed, Substance Abuse, Suicide, Mental Health Reviewed, Coping Strategies, Crisis, Anxiety, Depression, Grief and Loss  [Assessed]  Nutrition Interventions   Nutrition Discussed/Reviewed Nutrition Discussed, Portion sizes, Decreasing sugar intake, Nutrition Reviewed, Carbohydrate meal planning, Increasing proteins, Decreasing fats, Adding fruits and vegetables, Decreasing salt, Fluid intake  [Encouraged]  Pharmacy Interventions   Pharmacy Dicussed/Reviewed Pharmacy Topics Discussed, Affording Medications, Pharmacy Topics Reviewed, Medications and their functions, Medication Adherence  [Encouraged]  Medication Adherence --  [Medication Compliance]  Safety Interventions   Safety Discussed/Reviewed Safety Discussed, Safety Reviewed  [Encouraged]  Home Safety Assistive Devices, Refer for community resources  Advanced Directive Interventions   Advanced Directives Discussed/Reviewed Advanced Directives Discussed  [Encouraged Initiated]      Active Listening & Reflection Utilized.  Verbalization of Feelings Encouraged.  Emotional Support Provided. Problem Solving Interventions Implemented.   Solution-Focused Strategies Activated. Acceptance & Commitment Therapy Performed. Cognitive Behavioral Therapy Indicated. Client-Centered Therapy Initiated.   Encouraged Routine Engagement with Dr. Milagros Evener, Psychiatrist with [facility] [phone]), to Receive Psychotropic Medication Administration & Management, in An Effort to Reduce & Manage Symptoms of Anxiety, Depression & Panic Attacks. Encouraged Routine Engagement with Brain Lynnell Chad, Licensed Clinical Social Worker with [facility] [phone]), to Receive Psychotherapeutic Counseling & Supportive Services, in An Effort to Reduce & Manage Symptoms  of Anxiety, Depression & Panic Attacks.  Encouraged Engagement with Danford Bad, Social Work Case Manager with [facility] [phone]), if You Have Questions, Need Assistance, or If Additional Social Work Needs Are Identified Between Now & Our Next Follow-Up Outreach Call, Scheduled on 11/05/2023 at 1:30 PM.       Our next appointment is by telephone on  11/05/2023 at 1:30 pm.  Please call the care guide team at [phone] if you need to cancel or reschedule your appointment.   If you are experiencing a Mental Health or Behavioral Health Crisis or need someone to talk to, please call the Suicide and Crisis Lifeline: 988 call the [place] National Suicide Prevention Lifeline: 862 371 9810 or TTY: 623-466-4149 TTY 813-165-5732) to talk to a trained counselor call 1-800-273-TALK (toll free, 24 hour hotline) go to [facility] [address] [place] [phone]) call the [facility] Crisis Line: [phone] call 911  Patient verbalizes understanding of instructions and care plan provided today and agrees to view in MyChart. Active MyChart status and patient understanding of how to access instructions and care plan via MyChart confirmed with patient.     Telephone follow up appointment with care management team member scheduled for:   11/05/2023 at 1:30 pm.   Danford Bad, BSW, MSW, LCSW  [facility] Social  Work Case Manager  [facility], Population Health Direct Dial: [phone]  Fax: [phone] Email: [email or web address]

## 2023-10-23 NOTE — Patient Outreach (Signed)
Care Coordination   Follow Up Visit Note   10/23/2023  Name: Marvin Espinoza MRN: [id number] DOB: 07-29-1971  Marvin Espinoza is a 52 y.o. year old male who sees Marvin Espinoza for primary care. I spoke with Marvin Espinoza by phone today.  What matters to the patients health and wellness today?  Reduce & Manage Symptoms of Anxiety & Depression.    Goals Addressed               This Visit's Progress     Reduce & Manage Symptoms of Anxiety & Depression. (pt-stated)   On track     Care Coordination Interventions:  Interventions Today    Flowsheet Row Most Recent Value  Chronic Disease   Chronic disease during today's visit Diabetes, Other  [Adjustment Disorder w/Anxious Mood, Adjustment Insomnia, Bipolar Affective Psychosis, Drug Overdose, Generalized Anxiety Disorder, History of Concussion, Recurrent Severe Major Depressive Disorder, Mood Disorder, PTSD, Neuropathy, Tardive Dyskinesia]  General Interventions   General Interventions Discussed/Reviewed General Interventions Discussed, Doctor Visits, Durable Medical Equipment (DME), General Interventions Reviewed, Annual Eye Exam, Labs, Annual Foot Exam, Vaccines, Health Screening, Community Resources, Communication with  [Communication with Care Team Members]  Labs Hgb A1c every 3 months, Kidney Function  [Encouraged]  Vaccines COVID-19, Flu, Pneumonia, RSV, Shingles, Tetanus/Pertussis/Diphtheria  [Encouraged]  Doctor Visits Discussed/Reviewed Doctor Visits Discussed, Doctor Visits Reviewed, Annual Wellness Visits, PCP, Specialist  [Encouraged Collaboration]  Health Screening Bone Density, Colonoscopy, Prostate  [Encouraged]  Durable Medical Equipment (DME) Glucomoter, BP Cuff, Other  [Prescription Eyeglasses, Hand-Held Shower Hose, Engineer, materials in Air traffic controller, Cane]  PCP/Specialist Visits Compliance with follow-up visit  [Encouraged]  Communication with PCP/Specialists, Charity fundraiser, Pharmacists, Social Work  [Encouraged]  Exercise  Interventions   Exercise Discussed/Reviewed Exercise Discussed, Exercise Reviewed, Physical Activity, Weight Managment, Assistive device use and maintanence  [Encouraged]  Physical Activity Discussed/Reviewed Physical Activity Discussed, Physical Activity Reviewed, Types of exercise, Home Exercise Program (HEP), PREP, Gym  [Encouraged]  Weight Management Weight loss  [Encouraged]  Education Interventions   Education Provided Provided Therapist, sports, Provided Web-based Education, Provided Education  Provided Engineer, petroleum On Nutrition, Exercise, Medication, Foot Care, Eye Care, When to see the doctor, Labs, Blood Sugar Monitoring, Mental Health/Coping with Illness, Applications, [employer], [employer]  Labs Reviewed Hgb A1c  Mental Health Interventions   Mental Health Discussed/Reviewed Mental Health Discussed, Substance Abuse, Suicide, Mental Health Reviewed, Coping Strategies, Crisis, Anxiety, Depression, Grief and Loss  [Assessed]  Nutrition Interventions   Nutrition Discussed/Reviewed Nutrition Discussed, Portion sizes, Decreasing sugar intake, Nutrition Reviewed, Carbohydrate meal planning, Increasing proteins, Decreasing fats, Adding fruits and vegetables, Decreasing salt, Fluid intake  [Encouraged]  Pharmacy Interventions   Pharmacy Dicussed/Reviewed Pharmacy Topics Discussed, Affording Medications, Pharmacy Topics Reviewed, Medications and their functions, Medication Adherence  [Encouraged]  Medication Adherence --  [Medication Compliance]  Safety Interventions   Safety Discussed/Reviewed Safety Discussed, Safety Reviewed  [Encouraged]  Home Safety Assistive Devices, Refer for community resources  Advanced Directive Interventions   Advanced Directives Discussed/Reviewed Advanced Directives Discussed  [Encouraged Initiated]      Active Listening & Reflection Utilized.  Verbalization of Feelings Encouraged.  Emotional Support Provided. Problem Solving Interventions  Implemented.   Solution-Focused Strategies Activated. Acceptance & Commitment Therapy Performed. Cognitive Behavioral Therapy Indicated. Client-Centered Therapy Initiated.  Encouraged Routine Engagement with Dr. Milagros Espinoza, Psychiatrist with [facility] [phone]), to Receive Psychotropic Medication Administration & Management, in An Effort to Reduce & Manage Symptoms of Anxiety, Depression & Panic Attacks. Encouraged Routine Engagement with  Marvin Espinoza, Licensed Clinical Social Worker with [facility] [phone]), to Receive Psychotherapeutic Counseling & Supportive Services, in An Effort to Reduce & Manage Symptoms of Anxiety, Depression & Panic Attacks.  Encouraged Engagement with Marvin Espinoza, Social Work Case Manager with [facility] [phone]), if You Have Questions, Need Assistance, or If Additional Social Work Needs Are Identified Between Now & Our Next Follow-Up Outreach Call, Scheduled on 11/05/2023 at 1:30 PM.       SDOH assessments and interventions completed:  Yes.  Care Coordination Interventions:  Yes, provided.   Follow up plan: Follow up call scheduled for 11/05/2023 at 1:30 pm.  Encounter Outcome:  Patient Visit Completed.   Marvin Espinoza, BSW, MSW, Printmaker Social Work Case Set designer Health  [facility], Population Health Direct Dial: [phone]  Fax: [phone] Email: [email or web address]: [email or web address]

## 2023-10-27 NOTE — Therapy (Signed)
OUTPATIENT PHYSICAL THERAPY VESTIBULAR TREATMENT     Patient Name: Marvin Espinoza MRN: [id number] DOB:06-12-1971, 52 y.o., male Today's Date: 10/28/2023  END OF SESSION:  PT End of Session - 10/28/23 0841     Visit Number 4    Number of Visits 17    Date for PT Re-Evaluation 12/11/23    Authorization Type [employer]    Authorization - Number of Visits 30    PT Start Time 0801    PT Stop Time 0840    PT Time Calculation (min) 39 min    Activity Tolerance Patient tolerated treatment well    Behavior During Therapy [facility] for tasks assessed/performed                Past Medical History:  Diagnosis Date   Anxiety    Bipolar disorder (HCC)    CTS (carpal tunnel syndrome)    Depression    Diabetes mellitus without complication (HCC)    GERD (gastroesophageal reflux disease)    H/O bariatric surgery 2016   Head injury 05/06/2017   Hypothyroidism    Hypothyroidism    Sleep apnea    no longer with OSA d/t over 100 pd weight loss   Past Surgical History:  Procedure Laterality Date   ANAL FISSURE REPAIR     BARIATRIC SURGERY     CARPAL TUNNEL RELEASE Left 05/09/2015   Procedure: LEFT CARPAL TUNNEL RELEASE;  Surgeon: Cindee Salt, MD;  Location: [facility];  Service: Orthopedics;  Laterality: Left;   CARPAL TUNNEL RELEASE Left 05-09-2015   CARPAL TUNNEL RELEASE Right 06/20/2015   Procedure: RIGHT CARPAL TUNNEL RELEASE;  Surgeon: Cindee Salt, MD;  Location: [facility];  Service: Orthopedics;  Laterality: Right;  REGIONAL/FAB   CHOLECYSTECTOMY     COLONOSCOPY     UPPER GASTROINTESTINAL ENDOSCOPY     Patient Active Problem List   Diagnosis Date Noted   Concussion without loss of consciousness 10/22/2023   Burning sensation of skin 08/22/2022   Diabetes mellitus type II, controlled (HCC) 08/01/2021   Chronic constipation 12/29/2020   History of gastric surgery 03/21/2020   Overdose of benzodiazepine, intentional self-harm, initial encounter (HCC)  01/26/2020   MDD (major depressive disorder), recurrent severe, without psychosis (HCC) 01/26/2020   Drug overdose 01/25/2020   Onychomycosis 01/05/2019   Neuropathy 01/05/2019   Severe episode of recurrent major depressive disorder, without psychotic features (HCC) 10/04/2018   OSA (obstructive sleep apnea) 09/07/2018   Bilateral hip pain 10/24/2017   Shoulder pain, bilateral 10/24/2017   Arthritis 10/24/2017   Osteoarthritis of both hips 10/24/2017   Avascular necrosis of bone of hip, right (HCC) 10/24/2017   Osteoarthritis of shoulder 10/24/2017   PTSD (post-traumatic stress disorder) 08/20/2017   Tardive dyskinesia 08/20/2017   Severe major depression (HCC) 05/10/2017   Affective psychosis, bipolar (HCC) 05/10/2017   Vision disturbance 05/06/2017   History of concussion 02/10/2017   Mood disorder (HCC) 02/10/2017   Adjustment insomnia 01/24/2017   Adjustment disorder with anxious mood 01/24/2017   GAD (generalized anxiety disorder) 01/24/2017   Gastroesophageal reflux disease without esophagitis 11/19/2016   Hypothyroidism 11/19/2016   DDD (degenerative disc disease), lumbar 11/19/2016    PCP:    Raliegh Ip, DO   REFERRING PROVIDER: Andi Devon, DO  REFERRING DIAG: Q03.4V4Q (ICD-10-CM) - Concussion without loss of consciousness, initial encounter R42 (ICD-10-CM) - Dizziness and giddiness  THERAPY DIAG:  Dizziness and giddiness  Cervicalgia  Unsteadiness on feet  ONSET DATE: 08/31/23  Rationale for Evaluation and Treatment: Rehabilitation  SUBJECTIVE:   SUBJECTIVE STATEMENT:  About the same. Still getting dizzy when getting up.   Pt accompanied by: self and significant other  PERTINENT HISTORY: Anxiety, bipolar disorder, CTS, depression, DM, head injury 2018 Per Dr. Christella Hartigan' note: "only dizziness with nystagmus due to vestibular dysfunction likely from concussion, but could be compounded by his underlying psychotropic medications"   PAIN:  Are  you having pain? Yes: NPRS scale: 6/10 Pain location: neck Pain description: sharp Aggravating factors: neck flexion, R sidebending  Relieving factors: meds, not taking any more  PRECAUTIONS: None  RED FLAGS: None   WEIGHT BEARING RESTRICTIONS: No  FALLS: Has patient fallen in last 6 months? Yes. Number of falls only 1 causing concussion   LIVING ENVIRONMENT: Lives with: lives with their spouse Lives in: House/apartment Stairs:  2 steps to enter 2 story home but live on ground floor Has following equipment at home: Single point cane and Environmental consultant - 2 wheeled  PLOF: Independent; was working part time mowing and car detailing; currently on medical leave  PATIENT GOALS: improve dizziness   OBJECTIVE:     TODAY'S TREATMENT: 10/28/23 Activity Comments  review of HEP update: brandt daroff 1x each  sitting head nods to targets 10x  sitting head turns to targets 10x  L BD: L upbeating torsional nystagmus and dizziness lasting ~15 sec, followed by ocular oscillations  R BD: ocular oscillations and milder dizziness  Report of 3/10 dizziness w/ head turns   cervical rotation SNAG 5x each To tolerance; cues for proper hand placement   cervical extension SNAG 5x To tolerance; cues for proper hand technique   L DH No nystagmus but pt c/o dizziness and visible ocular oscillations   L Epley  Tolerated well; R upbeating torsional nystagmus lasting 25 sec in position 2  L DH No nystagmus and visible ocular oscillations; pt reports improved dizziness     PATIENT EDUCATION: Education details: edu on intended level of sx and how to adjust symptom intensity, edu on importance of physical activity and movement in concussion recovery and importance of finding appropriate level of intensity, taking breaks, MHP to neck for 10 min, HEP update  Person educated: Patient and Spouse Education method: Explanation, Demonstration, Tactile cues, Verbal cues, and Handouts Education comprehension: verbalized  understanding and returned demonstration    HOME EXERCISE PROGRAM Access Code: [email or web address] URL: [email or web address] Date: 10/28/2023 Prepared by: [facility] - Outpatient  Rehab - [facility]  Program Notes walk inside the house 5-10 minutes 2x/day  Exercises - Seated Shoulder Shrug Circles AROM Backward  - 1 x daily - 5 x weekly - 2 sets - 10 reps - Seated Shoulder Shrug Circles AROM Forward  - 1 x daily - 5 x weekly - 2 sets - 10 reps - Gentle Levator Scapulae Stretch  - 1 x daily - 5 x weekly - 2 sets - 20 sec hold - Brandt-Daroff Vestibular Exercise  - 1 x daily - 5 x weekly - 2 sets - 3-5 reps - Seated Head Nod  - 1 x daily - 5 x weekly - 2 sets - 10 reps - Seated Right Head Turns Vestibular Habituation  - 1 x daily - 5 x weekly - 2 sets - 10 reps - Seated Assisted Cervical Rotation with Towel  - 1 x daily - 5 x weekly - 2 sets - 10 reps - Mid-Lower Cervical Extension SNAG with Strap  - 1 x daily - 5 x weekly -  2 sets - 10 reps      ------------------------------------------------------------------ Note: Objective measures below were completed at Evaluation unless otherwise noted.  DIAGNOSTIC FINDINGS: 08/31/23 head/neck CT/angio: Normal CTA of the head and neck. No acute brain injury. 2. No acute cervical spine injury. 3. Mild left-sided neural foraminal narrowing at the C3-4 and C4-5 levels.  COGNITION: Overall cognitive status: Within functional limits for tasks assessed   SENSATION: Denies N/T  POSTURE:  rounded shoulders and forward head  PALPATION: soft tissue restriction and TTP over R>L splenius, LS, suboccipitals   Cervical ROM:    Active AROM (deg) eval  Flexion 32 *pain  Extension 33 *pain   Right lateral flexion 38   Left lateral flexion 35 *pain  Right rotation 46 *pain   Left rotation 59  (Blank rows = not tested)  GAIT: Gait pattern: narrow BOS, guarded, LOB and veering while walking  Assistive device utilized: None Level  of assistance: SBA  PATIENT SURVEYS:  FOTO [phone]  VESTIBULAR ASSESSMENT:  GENERAL OBSERVATION: pt wears bifocals or progressive lenses   OCULOMOTOR EXAM: *pt blinking repeatedly throughout oculomotor testing and reports sensation of eye movement- difficult to discern/visualize if nystagmus is actually occurring   Ocular Alignment: normal  Ocular ROM: No Limitations  Spontaneous Nystagmus: absent  Gaze-Induced Nystagmus: absent  Smooth Pursuits: saccades  Saccades:  slow and poor trajectory   Convergence/Divergence: reports clear throughout   VESTIBULAR - OCULAR REFLEX:   Slow VOR: Normal reduced control of neck movement    VOR Cancellation: Corrective Saccades to L and c/o "fuzzy"  Head-Impulse Test: HIT Right: negative HIT Left: positive     VESTIBULAR TREATMENT:                                                                                                   DATE: 10/16/23  PATIENT EDUCATION: Education details: prognosis, POC, HEP, advised to speak to MD about possibility of discontinuation of Meclizine for max vestibular recovery Person educated: Patient and Spouse Education method: Explanation, Demonstration, Tactile cues, Verbal cues, and Handouts Education comprehension: verbalized understanding and returned demonstration  HOME EXERCISE PROGRAM: Access Code: [email or web address] URL: [email or web address] Date: 10/16/2023 Prepared by: [facility] - Outpatient  Rehab - [facility]  Program Notes walk inside the house 5-10 minutes 2x/day  Exercises - Seated Shoulder Shrug Circles AROM Backward  - 1 x daily - 5 x weekly - 2 sets - 10 reps - Seated Shoulder Shrug Circles AROM Forward  - 1 x daily - 5 x weekly - 2 sets - 10 reps - Gentle Levator Scapulae Stretch  - 1 x daily - 5 x weekly - 2 sets - 20 sec hold - Seated Cervical Rotation AROM  - 1 x daily - 5 x weekly - 2 sets - 10 reps  GOALS: Goals reviewed with patient? Yes  SHORT TERM GOALS: Target  date: 11/06/2023  Patient to be independent with initial HEP. Baseline: HEP initiated Goal status: IN PROGRESS    LONG TERM GOALS: Target date: 12/11/2023  Patient to be independent with advanced HEP. 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  Baseline: Unable Goal status: IN PROGRESS  Patient will report 0/10 dizziness with bed mobility and transfers.  Baseline: Symptomatic  Goal status: IN PROGRESS  Patient to score at least 23/30 on FGA in order to decrease risk of falls. Baseline: NT Goal status: IN PROGRESS  Patient to report resolution of neck pain,.  Baseline: - Goal status: IN PROGRESS  Patient to score at least 59 on FOTO in order to indicate improved functional outcomes.  Baseline: 48 Goal status: IN PROGRESS    ASSESSMENT:  CLINICAL IMPRESSION: Patient arrived to session with improved gait speed and stability. Reports unchanged symptoms. Reviewed HEP which revealed L posterior canalithiasis but improved symptoms to R side after CRM last session. Able to tolerate head movement habituation fairly well with only mild symptoms. Progressed cervical stretches to tolerance. Initiated L Epley today, which was tolerated well however patient with return of nystagmus in position 2 of Epley (indicating R posterior BPPV), thus will need to treat R side again. Patient tolerated session well and reported understanding of all edu provided.   OBJECTIVE IMPAIRMENTS: Abnormal gait, decreased activity tolerance, decreased balance, decreased ROM, dizziness, increased muscle spasms, impaired flexibility, postural dysfunction, and pain.   ACTIVITY LIMITATIONS: carrying, lifting, bending, sitting, standing, squatting, sleeping, stairs, transfers, bed mobility, bathing, toileting, dressing, reach over head, hygiene/grooming, and locomotion level  PARTICIPATION LIMITATIONS: meal prep,  cleaning, laundry, driving, shopping, community activity, occupation, yard work, and church  PERSONAL FACTORS: Age, Behavior pattern, Past/current experiences, Time since onset of injury/illness/exacerbation, and 3+ comorbidities: Anxiety, bipolar disorder, CTS, depression, DM, head injury 2018  are also affecting patient's functional outcome.   REHAB POTENTIAL: Good  CLINICAL DECISION MAKING: Evolving/moderate complexity  EVALUATION COMPLEXITY: Moderate   PLAN:  PT FREQUENCY: 1-2x/week  PT DURATION: 8 weeks  PLANNED INTERVENTIONS: 97164- PT Re-evaluation, 97110-Therapeutic exercises, 97530- Therapeutic activity, [id number]- Neuromuscular re-education, 97535- Self Care, 82956- Manual therapy, [id number]- Gait training, [phone]- Canalith repositioning, [id number]- Aquatic Therapy, [phone]- Electrical stimulation (manual), Patient/Family education, Balance training, Stair training, Taping, Dry Needling, Joint mobilization, Spinal mobilization, Vestibular training, Cryotherapy, and Moist heat  PLAN FOR NEXT SESSION: Repeat positional testing and treat as appropriate.  Add balance and dizziness exercises into HEP, continue working on neck pain and encourage gentle movement    Anette Guarneri, PT, DPT 10/28/23 8:42 AM  [facility] at [facility] [address] [place], [address] [place], [place] [address] Phone # [phone] Fax # [phone]

## 2023-10-28 ENCOUNTER — Ambulatory Visit: Payer: 59 | Admitting: Physical Therapy

## 2023-10-28 ENCOUNTER — Encounter: Payer: Self-pay | Admitting: Physical Therapy

## 2023-10-28 ENCOUNTER — Ambulatory Visit: Payer: Self-pay | Admitting: *Deleted

## 2023-10-28 DIAGNOSIS — R42 Dizziness and giddiness: Secondary | ICD-10-CM

## 2023-10-28 DIAGNOSIS — M542 Cervicalgia: Secondary | ICD-10-CM

## 2023-10-28 DIAGNOSIS — R2681 Unsteadiness on feet: Secondary | ICD-10-CM

## 2023-10-28 NOTE — Patient Outreach (Signed)
Care Coordination   Follow Up Visit Note   10/28/2023  Name: Marvin Espinoza MRN: [id number] DOB: 06/06/1971  Marvin Espinoza is a 52 y.o. year old male who sees Raliegh Ip, DO for primary care. I spoke with [name] by phone today.  What matters to the patients health and wellness today?  Reduce & Manage Symptoms of Anxiety & Depression.    Goals Addressed               This Visit's Progress     Reduce & Manage Symptoms of Anxiety & Depression. (pt-stated)   On track     Care Coordination Interventions:  Interventions Today    Flowsheet Row Most Recent Value  Chronic Disease   Chronic disease during today's visit Diabetes, Other  [Adjustment Disorder w/Anxious Mood, Adjustment Insomnia, Bipolar Affective Psychosis, Drug Overdose, Generalized Anxiety Disorder, History of Concussion, Recurrent Severe Major Depressive Disorder, Mood Disorder, PTSD, Neuropathy, Tardive Dyskinesia]  General Interventions   General Interventions Discussed/Reviewed General Interventions Discussed, Doctor Visits, Durable Medical Equipment (DME), General Interventions Reviewed, Annual Eye Exam, Labs, Annual Foot Exam, Vaccines, Health Screening, Community Resources, Communication with  [Communication with Care Team Members]  Labs Hgb A1c every 3 months, Kidney Function  [Encouraged]  Vaccines COVID-19, Flu, Pneumonia, RSV, Shingles, Tetanus/Pertussis/Diphtheria  [Encouraged]  Doctor Visits Discussed/Reviewed Doctor Visits Discussed, Doctor Visits Reviewed, Annual Wellness Visits, PCP, Specialist  [Encouraged Collaboration]  Health Screening Bone Density, Colonoscopy, Prostate  [Encouraged]  Durable Medical Equipment (DME) Glucomoter, BP Cuff, Other  [Prescription Eyeglasses, Hand-Held Shower Hose, Engineer, materials in Air traffic controller, Cane]  PCP/Specialist Visits Compliance with follow-up visit  [Encouraged]  Communication with PCP/Specialists, Charity fundraiser, Pharmacists, Social Work  [Encouraged]  Exercise  Interventions   Exercise Discussed/Reviewed Exercise Discussed, Exercise Reviewed, Physical Activity, Weight Managment, Assistive device use and maintanence  [Encouraged]  Physical Activity Discussed/Reviewed Physical Activity Discussed, Physical Activity Reviewed, Types of exercise, Home Exercise Program (HEP), PREP, Gym  [Encouraged]  Weight Management Weight loss  [Encouraged]  Education Interventions   Education Provided Provided Therapist, sports, Provided Web-based Education, Provided Education  Provided Engineer, petroleum On Nutrition, Exercise, Medication, Foot Care, Eye Care, When to see the doctor, Labs, Blood Sugar Monitoring, Mental Health/Coping with Illness, Applications, [employer], [employer]  Labs Reviewed Hgb A1c  Mental Health Interventions   Mental Health Discussed/Reviewed Mental Health Discussed, Substance Abuse, Suicide, Mental Health Reviewed, Coping Strategies, Crisis, Anxiety, Depression, Grief and Loss  [Assessed]  Nutrition Interventions   Nutrition Discussed/Reviewed Nutrition Discussed, Portion sizes, Decreasing sugar intake, Nutrition Reviewed, Carbohydrate meal planning, Increasing proteins, Decreasing fats, Adding fruits and vegetables, Decreasing salt, Fluid intake  [Encouraged]  Pharmacy Interventions   Pharmacy Dicussed/Reviewed Pharmacy Topics Discussed, Affording Medications, Pharmacy Topics Reviewed, Medications and their functions, Medication Adherence  [Encouraged]  Medication Adherence --  [Medication Compliance]  Safety Interventions   Safety Discussed/Reviewed Safety Discussed, Safety Reviewed  [Encouraged]  Home Safety Assistive Devices, Refer for community resources  Advanced Directive Interventions   Advanced Directives Discussed/Reviewed Advanced Directives Discussed  [Encouraged Initiated]      Active Listening & Reflection Utilized.  Verbalization of Feelings Encouraged.  Emotional Support Provided. Problem Solving Interventions  Implemented.   Solution-Focused Strategies Activated. Acceptance & Commitment Therapy Performed. Cognitive Behavioral Therapy Indicated. Client-Centered Therapy Initiated.  Confirmed Receipt, Thoroughly Reviewed & Encouraged Chief Strategy Officer, Programs & Resources of Interest in [place], from List Provided:          ~ Holiday representative (272) 577-7814) ~ Williemae Natter (#  628-325-1093) ~ Lot 773-591-6143) ~ [facility] Department of Social Services 816-347-8257# (220) 656-6309) ~ [employer] [place] (631)003-9712) ~ [employer] 860-685-0302# 680-498-5396) ~ [employer] (# (732) 451-2821) ~ [employer] [place] (# (814)096-1730) ~ [employer] (# 2674163188) ~ [employer] (814)535-4314) ~ Aging, Disability & Transit Services of [place]  (# 734-276-3657) ~ Energy Program (934)574-2327) ~ Low Income Energy Assistance Program (850) 217-1257# (717)424-3030) ~ Venia Carbon 336 786 1378)  Confirmed Receipt, Thoroughly Reviewed & Encouraged Initiation of Applications for [employer] [place] [employer], Programs & Resources of Interest, from List Provided:          ~ Advertising account executive          ~ Energy Programs Application          ~ [employer] Assistance Program Application Confirmed Receipt, Thoroughly Reviewed & Encouraged Chief Strategy Officer, Programs & Resources of Interest in [place], from List Provided:          ~ Librarian, academic (# (404)131-0231) ~ Games developer Program (# 986-625-4345)   ~ [employer] 610 326 7085) Routine Engagement with Dr. Milagros Evener, Psychiatrist with [facility] [phone]), to Receive Psychotropic Medication Administration & Management, in An Effort to Reduce & Manage Symptoms of Anxiety, Depression & Panic Attacks. Encouraged Routine Engagement with Brain Lynnell Chad,  Licensed Clinical Social Worker with [facility] [phone]), to Receive Psychotherapeutic Counseling & Supportive Services, in An Effort to Reduce & Manage Symptoms of Anxiety, Depression & Panic Attacks.  Encouraged Engagement with Danford Bad, Social Work Case Manager with [facility] [phone]), if You Have Questions, Need Assistance, or If Additional Social Work Needs Are Identified Between Now & Our Next Follow-Up Outreach Call, Scheduled on 11/05/2023 at 1:30 PM.       SDOH assessments and interventions completed:  Yes.  Care Coordination Interventions:  Yes, provided.   Follow up plan: Follow up call scheduled for 11/05/2023 at 1:30 pm.  Encounter Outcome:  Patient Visit Completed.   Danford Bad, BSW, MSW, Printmaker Social Work Case Set designer Health  [facility], Population Health Direct Dial: [phone]  Fax: [phone] Email: [email or web address]: [email or web address]

## 2023-10-28 NOTE — Patient Instructions (Signed)
Visit Information  Thank you for taking time to visit with me today. Please don't hesitate to contact me if I can be of assistance to you.   Following are the goals we discussed today:   Goals Addressed               This Visit's Progress     Reduce & Manage Symptoms of Anxiety & Depression. (pt-stated)   On track     Care Coordination Interventions:  Interventions Today    Flowsheet Row Most Recent Value  Chronic Disease   Chronic disease during today's visit Diabetes, Other  [Adjustment Disorder w/Anxious Mood, Adjustment Insomnia, Bipolar Affective Psychosis, Drug Overdose, Generalized Anxiety Disorder, History of Concussion, Recurrent Severe Major Depressive Disorder, Mood Disorder, PTSD, Neuropathy, Tardive Dyskinesia]  General Interventions   General Interventions Discussed/Reviewed General Interventions Discussed, Doctor Visits, Durable Medical Equipment (DME), General Interventions Reviewed, Annual Eye Exam, Labs, Annual Foot Exam, Vaccines, Health Screening, Community Resources, Communication with  [Communication with Care Team Members]  Labs Hgb A1c every 3 months, Kidney Function  [Encouraged]  Vaccines COVID-19, Flu, Pneumonia, RSV, Shingles, Tetanus/Pertussis/Diphtheria  [Encouraged]  Doctor Visits Discussed/Reviewed Doctor Visits Discussed, Doctor Visits Reviewed, Annual Wellness Visits, PCP, Specialist  [Encouraged Collaboration]  Health Screening Bone Density, Colonoscopy, Prostate  [Encouraged]  Durable Medical Equipment (DME) Glucomoter, BP Cuff, Other  [Prescription Eyeglasses, Hand-Held Shower Hose, Engineer, materials in Air traffic controller, Cane]  PCP/Specialist Visits Compliance with follow-up visit  [Encouraged]  Communication with PCP/Specialists, Charity fundraiser, Pharmacists, Social Work  [Encouraged]  Exercise Interventions   Exercise Discussed/Reviewed Exercise Discussed, Exercise Reviewed, Physical Activity, Weight Managment, Assistive device use and maintanence  [Encouraged]  Physical  Activity Discussed/Reviewed Physical Activity Discussed, Physical Activity Reviewed, Types of exercise, Home Exercise Program (HEP), PREP, Gym  [Encouraged]  Weight Management Weight loss  [Encouraged]  Education Interventions   Education Provided Provided Therapist, sports, Provided Web-based Education, Provided Education  Provided Engineer, petroleum On Nutrition, Exercise, Medication, Foot Care, Eye Care, When to see the doctor, Labs, Blood Sugar Monitoring, Mental Health/Coping with Illness, Applications, [employer], [employer]  Labs Reviewed Hgb A1c  Mental Health Interventions   Mental Health Discussed/Reviewed Mental Health Discussed, Substance Abuse, Suicide, Mental Health Reviewed, Coping Strategies, Crisis, Anxiety, Depression, Grief and Loss  [Assessed]  Nutrition Interventions   Nutrition Discussed/Reviewed Nutrition Discussed, Portion sizes, Decreasing sugar intake, Nutrition Reviewed, Carbohydrate meal planning, Increasing proteins, Decreasing fats, Adding fruits and vegetables, Decreasing salt, Fluid intake  [Encouraged]  Pharmacy Interventions   Pharmacy Dicussed/Reviewed Pharmacy Topics Discussed, Affording Medications, Pharmacy Topics Reviewed, Medications and their functions, Medication Adherence  [Encouraged]  Medication Adherence --  [Medication Compliance]  Safety Interventions   Safety Discussed/Reviewed Safety Discussed, Safety Reviewed  [Encouraged]  Home Safety Assistive Devices, Refer for community resources  Advanced Directive Interventions   Advanced Directives Discussed/Reviewed Advanced Directives Discussed  [Encouraged Initiated]      Active Listening & Reflection Utilized.  Verbalization of Feelings Encouraged.  Emotional Support Provided. Problem Solving Interventions Implemented.   Solution-Focused Strategies Activated. Acceptance & Commitment Therapy Performed. Cognitive Behavioral Therapy Indicated. Client-Centered Therapy Initiated.   Confirmed Receipt, Thoroughly Reviewed & Encouraged Contact with Film/video editor, Programs & Resources of Interest in [place], from List Provided: ~ [employer] [phone]# 613-456-3400) ~ [employer] [phone]) ~ Lot 902-734-9352 (# (548)028-3638) ~ [facility] Department of Social Services ([phone]# 916-296-1928) ~ [employer] [place] (581)864-0167) ~ [employer] (445) 203-8570# 479-650-9454) ~ [employer] (808)727-4410) ~ [facility] [place] (#  586-749-2277) ~ [employer] (# (971) 386-1476) ~ [employer] (365)233-4300) ~ Aging, Disability & Transit Services of [place]   843-631-1019) ~ Energy Program 812-402-7627) ~ Low Income Energy Assistance Program 2023366043# 7697848680) ~ Venia Carbon 803-087-9471)  Confirmed Receipt, Thoroughly Reviewed & Encouraged Initiation of Applications for [employer] [place] [employer], Programs & Resources of Interest, from List Provided:          ~ Advertising account executive          ~ Energy Programs Application          ~ [employer] Assistance Program Application Confirmed Receipt, Thoroughly Reviewed & Encouraged Chief Strategy Officer, Programs & Resources of Interest in [place], from List Provided:          ~ Librarian, academic [phone]) ~ Games developer Program 872-445-6742# 760-504-4622)   ~ [employer] (502)165-9567) Routine Engagement with Dr. Milagros Evener, Psychiatrist with [facility] [phone]), to Receive Psychotropic Medication Administration & Management, in An Effort to Reduce & Manage Symptoms of Anxiety, Depression & Panic Attacks. Encouraged Routine Engagement with Brain Lynnell Chad, Licensed Clinical Social Worker with [facility] [phone]), to Receive Psychotherapeutic Counseling & Supportive Services, in An Effort to  Reduce & Manage Symptoms of Anxiety, Depression & Panic Attacks.  Encouraged Engagement with Danford Bad, Social Work Case Manager with [facility] [phone]), if You Have Questions, Need Assistance, or If Additional Social Work Needs Are Identified Between Now & Our Next Follow-Up Outreach Call, Scheduled on 11/05/2023 at 1:30 PM.       Our next appointment is by telephone on 11/05/2023 at 1:30 pm.  Please call the care guide team at [phone] if you need to cancel or reschedule your appointment.   If you are experiencing a Mental Health or Behavioral Health Crisis or need someone to talk to, please call the Suicide and Crisis Lifeline: 988 call the [place] National Suicide Prevention Lifeline: 360-356-9950 or TTY: 559-216-5800 TTY 4243254510) to talk to a trained counselor call 1-800-273-TALK (toll free, 24 hour hotline) go to [facility] [address], [place] [phone]) call the [facility] Crisis Line: (303)582-9498 call 911  Patient verbalizes understanding of instructions and care plan provided today and agrees to view in MyChart. Active MyChart status and patient understanding of how to access instructions and care plan via MyChart confirmed with patient.     Telephone follow up appointment with care management team member scheduled for:  11/05/2023 at 1:30 pm.  Danford Bad, BSW, MSW, LCSW  Embedded Practice Social Work Case Manager  [facility], Population Health Direct Dial: [phone]  Fax: [phone] Email: [email or web address] Website: [email or web address]

## 2023-10-28 NOTE — Therapy (Signed)
OUTPATIENT PHYSICAL THERAPY VESTIBULAR TREATMENT     Patient Name: Marvin Espinoza MRN: [id number] DOB:Sep 05, 1971, 52 y.o., male Today's Date: 10/30/2023  END OF SESSION:  PT End of Session - 10/30/23 0846     Visit Number 5    Number of Visits 17    Date for PT Re-Evaluation 12/11/23    Authorization Type [employer]    Authorization - Number of Visits 30    PT Start Time 0801    PT Stop Time 0845    PT Time Calculation (min) 44 min    Equipment Utilized During Treatment Gait belt    Activity Tolerance Patient tolerated treatment well    Behavior During Therapy WFL for tasks assessed/performed                 Past Medical History:  Diagnosis Date   Anxiety    Bipolar disorder (HCC)    CTS (carpal tunnel syndrome)    Depression    Diabetes mellitus without complication (HCC)    GERD (gastroesophageal reflux disease)    H/O bariatric surgery 2016   Head injury 05/06/2017   Hypothyroidism    Hypothyroidism    Sleep apnea    no longer with OSA d/t over 100 pd weight loss   Past Surgical History:  Procedure Laterality Date   ANAL FISSURE REPAIR     BARIATRIC SURGERY     CARPAL TUNNEL RELEASE Left 05/09/2015   Procedure: LEFT CARPAL TUNNEL RELEASE;  Surgeon: Cindee Salt, MD;  Location: [facility];  Service: Orthopedics;  Laterality: Left;   CARPAL TUNNEL RELEASE Left 05-09-2015   CARPAL TUNNEL RELEASE Right 06/20/2015   Procedure: RIGHT CARPAL TUNNEL RELEASE;  Surgeon: Cindee Salt, MD;  Location: [facility];  Service: Orthopedics;  Laterality: Right;  REGIONAL/FAB   CHOLECYSTECTOMY     COLONOSCOPY     UPPER GASTROINTESTINAL ENDOSCOPY     Patient Active Problem List   Diagnosis Date Noted   Concussion without loss of consciousness 10/22/2023   Burning sensation of skin 08/22/2022   Diabetes mellitus type II, controlled (HCC) 08/01/2021   Chronic constipation 12/29/2020   History of gastric surgery 03/21/2020   Overdose of benzodiazepine,  intentional self-harm, initial encounter (HCC) 01/26/2020   MDD (major depressive disorder), recurrent severe, without psychosis (HCC) 01/26/2020   Drug overdose 01/25/2020   Onychomycosis 01/05/2019   Neuropathy 01/05/2019   Severe episode of recurrent major depressive disorder, without psychotic features (HCC) 10/04/2018   OSA (obstructive sleep apnea) 09/07/2018   Bilateral hip pain 10/24/2017   Shoulder pain, bilateral 10/24/2017   Arthritis 10/24/2017   Osteoarthritis of both hips 10/24/2017   Avascular necrosis of bone of hip, right (HCC) 10/24/2017   Osteoarthritis of shoulder 10/24/2017   PTSD (post-traumatic stress disorder) 08/20/2017   Tardive dyskinesia 08/20/2017   Severe major depression (HCC) 05/10/2017   Affective psychosis, bipolar (HCC) 05/10/2017   Vision disturbance 05/06/2017   History of concussion 02/10/2017   Mood disorder (HCC) 02/10/2017   Adjustment insomnia 01/24/2017   Adjustment disorder with anxious mood 01/24/2017   GAD (generalized anxiety disorder) 01/24/2017   Gastroesophageal reflux disease without esophagitis 11/19/2016   Hypothyroidism 11/19/2016   DDD (degenerative disc disease), lumbar 11/19/2016    PCP:    Raliegh Ip, DO   REFERRING PROVIDER: Andi Devon, DO  REFERRING DIAG: N82.9F6O (ICD-10-CM) - Concussion without loss of consciousness, initial encounter R42 (ICD-10-CM) - Dizziness and giddiness  THERAPY DIAG:  Dizziness and giddiness  Cervicalgia  Unsteadiness on feet  ONSET DATE: 08/31/23  Rationale for Evaluation and Treatment: Rehabilitation  SUBJECTIVE:   SUBJECTIVE STATEMENT: Not too bad. Reports feeling "about the same" after last session.   Pt accompanied by: self and significant other  PERTINENT HISTORY: Anxiety, bipolar disorder, CTS, depression, DM, head injury 2018 Per Dr. Christella Hartigan' note: "only dizziness with nystagmus due to vestibular dysfunction likely from concussion, but could be compounded by  his underlying psychotropic medications"   PAIN:  Are you having pain? Yes: NPRS scale: 3/10 Pain location: neck Pain description: sharp Aggravating factors: neck flexion, R sidebending  Relieving factors: meds, not taking any more  PRECAUTIONS: None  RED FLAGS: None   WEIGHT BEARING RESTRICTIONS: No  FALLS: Has patient fallen in last 6 months? Yes. Number of falls only 1 causing concussion   LIVING ENVIRONMENT: Lives with: lives with their spouse Lives in: House/apartment Stairs:  2 steps to enter 2 story home but live on ground floor Has following equipment at home: Single point cane and Environmental consultant - 2 wheeled  PLOF: Independent; was working part time mowing and car detailing; currently on medical leave  PATIENT GOALS: improve dizziness   OBJECTIVE:     TODAY'S TREATMENT: 10/30/23 Activity Comments  romberg EC 2x30" Mild sway; pt appears tense   standing head turns/nods 2x30" Good stability; reports EO head nods is harder   EC standing head turns/nods 2x30" Good stability ; reports more challenging with EC turns   1/2 turns in doorway to targets 3x4 reps  Slow and multiple small steps; c/o mild dizziness; good improvement in pace and step efficiency after cueing    Alt toe tap to cone  Slow, mild instability; no longer reports dizziness looking down   STM to neck  Soft tissue restriction and TTP in R UT, cervical paraspinals, SCM  Upon lying supine pt with dizziness and R upbeating nystagmus lasing 15 sec   R DH R upbeating torsional nystagmus lasting 15 sec, then ocular oscillations without nystagmus   R epley C/o dizziness in position 3; tolerated well                 HOME EXERCISE PROGRAM Last updated: 10/30/23 Access Code: [email or web address] URL: [email or web address] Date: 10/30/2023 Prepared by: [facility] - Outpatient  Rehab - [facility]  Program Notes walk inside the house 5-10 minutes 2x/day  Exercises - Gentle Levator Scapulae Stretch  -  1 x daily - 5 x weekly - 2 sets - 20 sec hold - Brandt-Daroff Vestibular Exercise  - 1 x daily - 5 x weekly - 2 sets - 3-5 reps - Seated Assisted Cervical Rotation with Towel  - 1 x daily - 5 x weekly - 2 sets - 10 reps - Mid-Lower Cervical Extension SNAG with Strap  - 1 x daily - 5 x weekly - 2 sets - 10 reps - Standing with Head Nod  - 1 x daily - 5 x weekly - 2-3 sets - 30 sec hold - Standing with Head Rotation  - 1 x daily - 5 x weekly - 2-3 sets - 30 sec hold - 180 Degree Pivot Turn with Single Point Cane  - 1 x daily - 5 x weekly - 2-3 sets - 5 reps   PATIENT EDUCATION: Education details: HEP update with edu for safety Person educated: Patient and Spouse Education method: Explanation, Demonstration, Tactile cues, Verbal cues, and Handouts Education comprehension: verbalized understanding and returned demonstration    ------------------------------------------------------------------ Note: Objective measures  below were completed at Evaluation unless otherwise noted.  DIAGNOSTIC FINDINGS: 08/31/23 head/neck CT/angio: Normal CTA of the head and neck. No acute brain injury. 2. No acute cervical spine injury. 3. Mild left-sided neural foraminal narrowing at the C3-4 and C4-5 levels.  COGNITION: Overall cognitive status: Within functional limits for tasks assessed   SENSATION: Denies N/T  POSTURE:  rounded shoulders and forward head  PALPATION: soft tissue restriction and TTP over R>L splenius, LS, suboccipitals   Cervical ROM:    Active AROM (deg) eval  Flexion 32 *pain  Extension 33 *pain   Right lateral flexion 38   Left lateral flexion 35 *pain  Right rotation 46 *pain   Left rotation 59  (Blank rows = not tested)  GAIT: Gait pattern: narrow BOS, guarded, LOB and veering while walking  Assistive device utilized: None Level of assistance: SBA  PATIENT SURVEYS:  FOTO [phone]  VESTIBULAR ASSESSMENT:  GENERAL OBSERVATION: pt wears bifocals or progressive  lenses   OCULOMOTOR EXAM: *pt blinking repeatedly throughout oculomotor testing and reports sensation of eye movement- difficult to discern/visualize if nystagmus is actually occurring   Ocular Alignment: normal  Ocular ROM: No Limitations  Spontaneous Nystagmus: absent  Gaze-Induced Nystagmus: absent  Smooth Pursuits: saccades  Saccades:  slow and poor trajectory   Convergence/Divergence: reports clear throughout   VESTIBULAR - OCULAR REFLEX:   Slow VOR: Normal reduced control of neck movement    VOR Cancellation: Corrective Saccades to L and c/o "fuzzy"  Head-Impulse Test: HIT Right: negative HIT Left: positive     VESTIBULAR TREATMENT:                                                                                                   DATE: 10/16/23  PATIENT EDUCATION: Education details: prognosis, POC, HEP, advised to speak to MD about possibility of discontinuation of Meclizine for max vestibular recovery Person educated: Patient and Spouse Education method: Explanation, Demonstration, Tactile cues, Verbal cues, and Handouts Education comprehension: verbalized understanding and returned demonstration  HOME EXERCISE PROGRAM: Access Code: [email or web address] URL: [email or web address] Date: 10/16/2023 Prepared by: [facility] - Outpatient  Rehab - [facility]  Program Notes walk inside the house 5-10 minutes 2x/day  Exercises - Seated Shoulder Shrug Circles AROM Backward  - 1 x daily - 5 x weekly - 2 sets - 10 reps - Seated Shoulder Shrug Circles AROM Forward  - 1 x daily - 5 x weekly - 2 sets - 10 reps - Gentle Levator Scapulae Stretch  - 1 x daily - 5 x weekly - 2 sets - 20 sec hold - Seated Cervical Rotation AROM  - 1 x daily - 5 x weekly - 2 sets - 10 reps  GOALS: Goals reviewed with patient? Yes  SHORT TERM GOALS: Target date: 11/06/2023  Patient to be independent with initial HEP. Baseline: HEP initiated Goal status: IN PROGRESS    LONG TERM GOALS:  Target date: 12/11/2023  Patient to be independent with advanced HEP. 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  Baseline: Unable Goal status: IN PROGRESS  Patient will report 0/10 dizziness with bed mobility and transfers.  Baseline: Symptomatic  Goal status: IN PROGRESS  Patient to score at least 23/30 on FGA in order to decrease risk of falls. Baseline: NT Goal status: IN PROGRESS  Patient to report resolution of neck pain,.  Baseline: - Goal status: IN PROGRESS  Patient to score at least 59 on FOTO in order to indicate improved functional outcomes.  Baseline: 48 Goal status: IN PROGRESS    ASSESSMENT:  CLINICAL IMPRESSION: Patient arrived to session without new complaints. Progressed balance activities with EC and narrow BOS challenges and gaze stabilization, now in standing. Patient tolerated these exercises well. Initiated turns with cueing to take larger steps and increase pace. Patient tolerated MT to address R sided neck pain and tissue restriction. Addressed remaining dizziness with R Epley which was tolerated well. No complaints upon leaving. Patient is progressing well towards goals.   OBJECTIVE IMPAIRMENTS: Abnormal gait, decreased activity tolerance, decreased balance, decreased ROM, dizziness, increased muscle spasms, impaired flexibility, postural dysfunction, and pain.   ACTIVITY LIMITATIONS: carrying, lifting, bending, sitting, standing, squatting, sleeping, stairs, transfers, bed mobility, bathing, toileting, dressing, reach over head, hygiene/grooming, and locomotion level  PARTICIPATION LIMITATIONS: meal prep, cleaning, laundry, driving, shopping, community activity, occupation, yard work, and church  PERSONAL FACTORS: Age, Behavior pattern, Past/current experiences, Time since onset of injury/illness/exacerbation, and 3+ comorbidities: Anxiety, bipolar  disorder, CTS, depression, DM, head injury 2018  are also affecting patient's functional outcome.   REHAB POTENTIAL: Good  CLINICAL DECISION MAKING: Evolving/moderate complexity  EVALUATION COMPLEXITY: Moderate   PLAN:  PT FREQUENCY: 1-2x/week  PT DURATION: 8 weeks  PLANNED INTERVENTIONS: 97164- PT Re-evaluation, 97110-Therapeutic exercises, 97530- Therapeutic activity, [id number]- Neuromuscular re-education, 97535- Self Care, 16109- Manual therapy, [id number]- Gait training, [phone]- Canalith repositioning, [id number]- Aquatic Therapy, [phone]- Electrical stimulation (manual), Patient/Family education, Balance training, Stair training, Taping, Dry Needling, Joint mobilization, Spinal mobilization, Vestibular training, Cryotherapy, and Moist heat  PLAN FOR NEXT SESSION: Repeat positional testing and treat as appropriate.  Add balance and dizziness exercises into HEP, continue working on neck pain and encourage gentle movement    Anette Guarneri, PT, DPT 10/30/23 8:47 AM  [facility] at [facility] [address] [place], [address] [place], [place] [address] Phone # [phone] Fax # [phone]

## 2023-10-30 ENCOUNTER — Encounter: Payer: Self-pay | Admitting: Physical Therapy

## 2023-10-30 ENCOUNTER — Ambulatory Visit: Payer: 59 | Admitting: Physical Therapy

## 2023-10-30 DIAGNOSIS — M542 Cervicalgia: Secondary | ICD-10-CM

## 2023-10-30 DIAGNOSIS — R42 Dizziness and giddiness: Secondary | ICD-10-CM

## 2023-10-30 DIAGNOSIS — R2681 Unsteadiness on feet: Secondary | ICD-10-CM | POA: Diagnosis not present

## 2023-10-31 ENCOUNTER — Encounter: Payer: Self-pay | Admitting: Family Medicine

## 2023-10-31 ENCOUNTER — Other Ambulatory Visit (INDEPENDENT_AMBULATORY_CARE_PROVIDER_SITE_OTHER): Payer: 59

## 2023-10-31 ENCOUNTER — Encounter: Payer: Self-pay | Admitting: Pharmacist

## 2023-10-31 ENCOUNTER — Telehealth: Payer: Self-pay | Admitting: Family Medicine

## 2023-10-31 ENCOUNTER — Telehealth: Payer: Self-pay

## 2023-10-31 DIAGNOSIS — E119 Type 2 diabetes mellitus without complications: Secondary | ICD-10-CM

## 2023-10-31 DIAGNOSIS — Z794 Long term (current) use of insulin: Secondary | ICD-10-CM

## 2023-10-31 DIAGNOSIS — F4312 Post-traumatic stress disorder, chronic: Secondary | ICD-10-CM | POA: Diagnosis not present

## 2023-10-31 MED ORDER — TRULICITY 0.75 MG/0.5ML ~~LOC~~ SOAJ
0.7500 mg | SUBCUTANEOUS | 5 refills | Status: DC
Start: 2023-10-31 — End: 2024-03-09

## 2023-10-31 MED ORDER — TRESIBA FLEXTOUCH 200 UNIT/ML ~~LOC~~ SOPN: PEN_INJECTOR | SUBCUTANEOUS | 5 refills | Status: DC

## 2023-10-31 NOTE — Telephone Encounter (Signed)
Patient calling because he is unsure whether he is to start [place] now or wait until next week.  Marvin Espinoza explained to patient based on notes from today's visit that he is to wait.  Please clarify.

## 2023-10-31 NOTE — Telephone Encounter (Unsigned)
Copied from CRM [id number]. Topic: General - Other >> Oct 31, 2023  3:41 PM Sasha H wrote: Reason for CRM: Pts wife would like a call back from [place] regarding meds . One med was approved but the other hasn't been and they dont want to start the approved one yet

## 2023-10-31 NOTE — Telephone Encounter (Signed)
My chart message sent to patient with specific instructions

## 2023-10-31 NOTE — Progress Notes (Signed)
   10/31/2023 Name: Marvin Espinoza MRN: [id number] DOB: 10/10/1971  Chief Complaint  Patient presents with   Diabetes    Son Bonesteel is a 52 y.o. year old male who presented for a telephone visit.  I connected with  [name] on 10/31/23 by telephone and verified that I am speaking with the correct person using two identifiers.  I discussed the limitations of evaluation and management by telemedicine. The patient expressed understanding and agreed to proceed.  Patient was located in her home and PharmD in PCP office during this visit.   They were referred to the pharmacist by their PCP for assistance in managing diabetes and medication access.  Patient is interested in once weekly GLP1 vs staying on Soliqua.  We will explore insurance benefits, etc.  Subjective:  Care Team: Primary Care Provider: Raliegh Ip, DO   Medication Access/Adherence  Current Pharmacy:  [facility], [place] - [address] [place] [address] Phone: [phone] Fax: [phone]   Patient reports affordability concerns with their medications: Yes  Patient reports access/transportation concerns to their pharmacy: No  Patient reports adherence concerns with their medications:  No     Diabetes:  Current medications: soliqua, metformin Medications tried in the past: ozempic samples  Current glucose readings: controlled  Current meal patterns:  Discussed meal planning options and Plate method for healthy eating Avoid sugary drinks and desserts Incorporate balanced protein, non starchy veggies, 1 serving of carbohydrate with each meal Increase water intake Increase physical activity as able  Current physical activity: encouraged as able  Current medication access support: commerical   Objective:  Lab Results  Component Value Date   HGBA1C 6.1 (H) 10/07/2023    Lab Results  Component Value Date   CREATININE 1.04 08/31/2023   BUN 14  08/31/2023   NA 137 08/31/2023   K 4.2 08/31/2023   CL 104 08/31/2023   CO2 25 08/31/2023    Lab Results  Component Value Date   CHOL 103 06/27/2023   HDL 48 06/27/2023   LDLCALC 41 06/27/2023   TRIG 65 06/27/2023   CHOLHDL 2.1 06/27/2023    Medications Reviewed Today   Medications were not reviewed in this encounter     Assessment/Plan:  -STOP Soliqua -START Evaristo Bury -START Trulicity -Continue metformin for now -patient verbalizes understanding   Follow Up Plan: next week  Kieth Brightly, PharmD, BCACP, CPP Clinical Pharmacist, [facility]

## 2023-10-31 NOTE — Telephone Encounter (Signed)
Copied from CRM [id number]. Topic: Clinical - Medication Question >> Oct 31, 2023  4:23 PM Roswell Nickel wrote: Reason for EAV:[id number] wife to let Raynelle Fanning Know that trulcty,approve and can Raynelle Fanning go ahead call in Nida Boatman contact info [phone]

## 2023-10-31 NOTE — Telephone Encounter (Signed)
Marvin Espinoza called in Will message patient via my chart

## 2023-11-03 ENCOUNTER — Telehealth: Payer: Self-pay | Admitting: Family Medicine

## 2023-11-03 ENCOUNTER — Telehealth: Payer: Self-pay

## 2023-11-03 NOTE — Telephone Encounter (Signed)
Copied from CRM [id number]. Topic: Clinical - Prescription Issue >> Oct 31, 2023  4:33 PM Tiffany H wrote: Reason for CRM: [facility] called to advise that the patient is missing a prescription. [facility] advised Gerre Scull was discontinued. Unable to contact pharmacist Arlys John back. Please inform Arlys John if he calls back.

## 2023-11-03 NOTE — Telephone Encounter (Signed)
Duplicate message. 

## 2023-11-03 NOTE — Telephone Encounter (Signed)
Copied from CRM [id number]. Topic: Clinical - Prescription Issue >> Oct 31, 2023  4:33 PM Tiffany H wrote: Reason for CRM: Arlys [facility] with [facility] called to advise that the patient is missing a prescription. Clinic Access advised Gerre Scull was discontinued. Unable to contact pharmacist Arlys John back. Please inform Arlys John if he calls back.

## 2023-11-03 NOTE — Therapy (Signed)
OUTPATIENT PHYSICAL THERAPY VESTIBULAR TREATMENT     Patient Name: Marvin Espinoza MRN: [id number] DOB:[birth year], 52 y.o., male Today's Date: 11/04/2023  END OF SESSION:  PT End of Session - 11/04/23 0849     Visit Number 6    Number of Visits 17    Date for PT Re-Evaluation 12/11/23    Authorization Type [employer]    Authorization - Number of Visits 30    PT Start Time 0801    PT Stop Time 0842    PT Time Calculation (min) 41 min    Equipment Utilized During Treatment Gait belt    Activity Tolerance Patient tolerated treatment well    Behavior During Therapy WFL for tasks assessed/performed                  Past Medical History:  Diagnosis Date   Anxiety    Bipolar disorder (HCC)    CTS (carpal tunnel syndrome)    Depression    Diabetes mellitus without complication (HCC)    GERD (gastroesophageal reflux disease)    H/O bariatric surgery 2016   Head injury 05/06/2017   Hypothyroidism    Hypothyroidism    Sleep apnea    no longer with OSA d/t over 100 pd weight loss   Past Surgical History:  Procedure Laterality Date   ANAL FISSURE REPAIR     BARIATRIC SURGERY     CARPAL TUNNEL RELEASE Left 05/09/2015   Procedure: LEFT CARPAL TUNNEL RELEASE;  Surgeon: Cindee Salt, MD;  Location: [facility];  Service: Orthopedics;  Laterality: Left;   CARPAL TUNNEL RELEASE Left 05-09-2015   CARPAL TUNNEL RELEASE Right 06/20/2015   Procedure: RIGHT CARPAL TUNNEL RELEASE;  Surgeon: Cindee Salt, MD;  Location:  SURGERY CENTER;  Service: Orthopedics;  Laterality: Right;  REGIONAL/FAB   CHOLECYSTECTOMY     COLONOSCOPY     UPPER GASTROINTESTINAL ENDOSCOPY     Patient Active Problem List   Diagnosis Date Noted   Concussion without loss of consciousness 10/22/2023   Burning sensation of skin 08/22/2022   Diabetes mellitus type II, controlled (HCC) 08/01/2021   Chronic constipation 12/29/2020   History of gastric surgery 03/21/2020   Overdose of  benzodiazepine, intentional self-harm, initial encounter (HCC) 01/26/2020   MDD (major depressive disorder), recurrent severe, without psychosis (HCC) 01/26/2020   Drug overdose 01/25/2020   Onychomycosis 01/05/2019   Neuropathy 01/05/2019   Severe episode of recurrent major depressive disorder, without psychotic features (HCC) 10/04/2018   OSA (obstructive sleep apnea) 09/07/2018   Bilateral hip pain 10/24/2017   Shoulder pain, bilateral 10/24/2017   Arthritis 10/24/2017   Osteoarthritis of both hips 10/24/2017   Avascular necrosis of bone of hip, right (HCC) 10/24/2017   Osteoarthritis of shoulder 10/24/2017   PTSD (post-traumatic stress disorder) 08/20/2017   Tardive dyskinesia 08/20/2017   Severe major depression (HCC) 05/10/2017   Affective psychosis, bipolar (HCC) 05/10/2017   Vision disturbance 05/06/2017   History of concussion 02/10/2017   Mood disorder (HCC) 02/10/2017   Adjustment insomnia 01/24/2017   Adjustment disorder with anxious mood 01/24/2017   GAD (generalized anxiety disorder) 01/24/2017   Gastroesophageal reflux disease without esophagitis 11/19/2016   Hypothyroidism 11/19/2016   DDD (degenerative disc disease), lumbar 11/19/2016    PCP:    Raliegh Ip, DO   REFERRING PROVIDER: Andi Devon, DO  REFERRING DIAG: U72.5D6U (ICD-10-CM) - Concussion without loss of consciousness, initial encounter R42 (ICD-10-CM) - Dizziness and giddiness  THERAPY DIAG:  Dizziness and giddiness  Cervicalgia  Unsteadiness on feet  ONSET DATE: 08/31/23  Rationale for Evaluation and Treatment: Rehabilitation  SUBJECTIVE:   SUBJECTIVE STATEMENT: Did "basically nothing" over the weekend. I'm walking fine. Still dizzy when lying down and sitting up and when looking overhead for longer durations. Things are getting better. Had a misstep trying to his his lawnmower in his trailer but did not fall.   Pt accompanied by: self and significant other  PERTINENT  HISTORY: Anxiety, bipolar disorder, CTS, depression, DM, head injury 2018 Per Dr. Christella Hartigan' note: "only dizziness with nystagmus due to vestibular dysfunction likely from concussion, but could be compounded by his underlying psychotropic medications"   PAIN:  Are you having pain? Yes: NPRS scale: 0/10 Pain location: neck Pain description: sharp Aggravating factors: neck flexion, R sidebending  Relieving factors: meds, not taking any more  PRECAUTIONS: None  RED FLAGS: None   WEIGHT BEARING RESTRICTIONS: No  FALLS: Has patient fallen in last 6 months? Yes. Number of falls only 1 causing concussion   LIVING ENVIRONMENT: Lives with: lives with their spouse Lives in: House/apartment Stairs:  2 steps to enter 2 story home but live on ground floor Has following equipment at home: Single point cane and Environmental consultant - 2 wheeled  PLOF: Independent; was working part time mowing and car detailing; currently on medical leave  PATIENT GOALS: improve dizziness   OBJECTIVE:     TODAY'S TREATMENT: 11/04/23 Activity Comments  1/2 turns to targets + alt toe tap to cone Good stability; slightly reduced pace   Cornhole game, bending down to floor, then standing back up  No dizziness; good quick speed   placing cones on floor, then forward step over; then performed backwards  Min A occasionally required with forward steps and cues for wider BOS  D2 flexion to cone on floor 2x5 each side Cues for increased head movement and quicker pace; no dizziness   standing on foam, then romberg 30"  Good stability   standing on foam EC, then romberg 30"  Mild sway  Romberg on foam + head turns/nods 30" Mild sway       HOME EXERCISE PROGRAM Access Code: [email or web address] URL: [email or web address] Date: 11/04/2023 Prepared by: [facility] - Outpatient  Rehab - [facility]  Program Notes walk inside the house 5-10 minutes 2x/day  Exercises - Brandt-Daroff Vestibular Exercise  - 1 x daily - 5 x  weekly - 2 sets - 3-5 reps - Romberg Stance on Foam Pad with Head Rotation  - 1 x daily - 5 x weekly - 2-3 sets - 30 sec hold - Romberg Stance with Head Nods on Foam Pad  - 1 x daily - 5 x weekly - 2-3 sets - 30 sec hold - Standing Diagonal Chops with Medicine Ball  - 1 x daily - 5 x weekly - 2 sets - 10 reps    PATIENT EDUCATION: Education details: HEP update, encouraged improved safety awareness especially when performing more active tasks and hobbies Person educated: Patient and Spouse Education method: Explanation, Demonstration, Tactile cues, Verbal cues, and Handouts Education comprehension: verbalized understanding and returned demonstration   ------------------------------------------------------------------ Note: Objective measures below were completed at Evaluation unless otherwise noted.  DIAGNOSTIC FINDINGS: 08/31/23 head/neck CT/angio: Normal CTA of the head and neck. No acute brain injury. 2. No acute cervical spine injury. 3. Mild left-sided neural foraminal narrowing at the C3-4 and C4-5 levels.  COGNITION: Overall cognitive status: Within functional limits for tasks assessed   SENSATION: Denies N/T  POSTURE:  rounded shoulders and forward head  PALPATION: soft tissue restriction and TTP over R>L splenius, LS, suboccipitals   Cervical ROM:    Active AROM (deg) eval  Flexion 32 *pain  Extension 33 *pain   Right lateral flexion 38   Left lateral flexion 35 *pain  Right rotation 46 *pain   Left rotation 59  (Blank rows = not tested)  GAIT: Gait pattern: narrow BOS, guarded, LOB and veering while walking  Assistive device utilized: None Level of assistance: SBA  PATIENT SURVEYS:  FOTO [phone]  VESTIBULAR ASSESSMENT:  GENERAL OBSERVATION: pt wears bifocals or progressive lenses   OCULOMOTOR EXAM: *pt blinking repeatedly throughout oculomotor testing and reports sensation of eye movement- difficult to discern/visualize if nystagmus is actually  occurring   Ocular Alignment: normal  Ocular ROM: No Limitations  Spontaneous Nystagmus: absent  Gaze-Induced Nystagmus: absent  Smooth Pursuits: saccades  Saccades:  slow and poor trajectory   Convergence/Divergence: reports clear throughout   VESTIBULAR - OCULAR REFLEX:   Slow VOR: Normal reduced control of neck movement    VOR Cancellation: Corrective Saccades to L and c/o "fuzzy"  Head-Impulse Test: HIT Right: negative HIT Left: positive     VESTIBULAR TREATMENT:                                                                                                   DATE: 10/16/23  PATIENT EDUCATION: Education details: prognosis, POC, HEP, advised to speak to MD about possibility of discontinuation of Meclizine for max vestibular recovery Person educated: Patient and Spouse Education method: Explanation, Demonstration, Tactile cues, Verbal cues, and Handouts Education comprehension: verbalized understanding and returned demonstration  HOME EXERCISE PROGRAM: Access Code: [email or web address] URL: [email or web address] Date: 10/16/2023 Prepared by: [facility] - Outpatient  Rehab - [facility]  Program Notes walk inside the house 5-10 minutes 2x/day  Exercises - Seated Shoulder Shrug Circles AROM Backward  - 1 x daily - 5 x weekly - 2 sets - 10 reps - Seated Shoulder Shrug Circles AROM Forward  - 1 x daily - 5 x weekly - 2 sets - 10 reps - Gentle Levator Scapulae Stretch  - 1 x daily - 5 x weekly - 2 sets - 20 sec hold - Seated Cervical Rotation AROM  - 1 x daily - 5 x weekly - 2 sets - 10 reps  GOALS: Goals reviewed with patient? Yes  SHORT TERM GOALS: Target date: 11/06/2023  Patient to be independent with initial HEP. Baseline: HEP initiated Goal status: IN PROGRESS    LONG TERM GOALS: Target date: 12/11/2023  Patient to be independent with advanced HEP. 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  Baseline: Unable Goal status: IN PROGRESS  Patient will report 0/10 dizziness with bed mobility and transfers.  Baseline: Symptomatic  Goal status: IN PROGRESS  Patient to score at least 23/30 on FGA in order to decrease risk of falls. Baseline: NT Goal status: IN PROGRESS  Patient to report resolution of neck pain,.  Baseline: -  Goal status: IN PROGRESS  Patient to score at least 59 on FOTO in order to indicate improved functional outcomes.  Baseline: 48 Goal status: IN PROGRESS    ASSESSMENT:  CLINICAL IMPRESSION: Patient arrived to session with report of continued improvement in activity tolerance and exercise. No only noting symptoms with prolonged neck extension and bed mobility. Progressed to habituation activities involving bending, turning, and looking overhead. Patient demonstrated improved stability with SLS tasks; some imbalance when stepping over obstacles. Denies dizziness with duration of session today. Reported understanding of HEP update and edu on slow and safe return to hobbies as patient reports near-fall since last session. Progressing well towards goals.   OBJECTIVE IMPAIRMENTS: Abnormal gait, decreased activity tolerance, decreased balance, decreased ROM, dizziness, increased muscle spasms, impaired flexibility, postural dysfunction, and pain.   ACTIVITY LIMITATIONS: carrying, lifting, bending, sitting, standing, squatting, sleeping, stairs, transfers, bed mobility, bathing, toileting, dressing, reach over head, hygiene/grooming, and locomotion level  PARTICIPATION LIMITATIONS: meal prep, cleaning, laundry, driving, shopping, community activity, occupation, yard work, and church  PERSONAL FACTORS: Age, Behavior pattern, Past/current experiences, Time since onset of injury/illness/exacerbation, and 3+ comorbidities: Anxiety, bipolar disorder, CTS, depression, DM, head injury 2018  are also affecting patient's  functional outcome.   REHAB POTENTIAL: Good  CLINICAL DECISION MAKING: Evolving/moderate complexity  EVALUATION COMPLEXITY: Moderate   PLAN:  PT FREQUENCY: 1-2x/week  PT DURATION: 8 weeks  PLANNED INTERVENTIONS: 97164- PT Re-evaluation, 97110-Therapeutic exercises, 97530- Therapeutic activity, [id number]- Neuromuscular re-education, 97535- Self Care, 16109- Manual therapy, [id number]- Gait training, [phone]- Canalith repositioning, [id number]- Aquatic Therapy, [phone]- Electrical stimulation (manual), Patient/Family education, Balance training, Stair training, Taping, Dry Needling, Joint mobilization, Spinal mobilization, Vestibular training, Cryotherapy, and Moist heat  PLAN FOR NEXT SESSION: Repeat positional testing and treat as appropriate.  Add balance and dizziness exercises into HEP  Anette Guarneri, PT, DPT 11/04/23 8:49 AM  [facility] at [facility] [address] [place], [address] [place], [place] [address] Phone # [phone] Fax # [phone]

## 2023-11-04 ENCOUNTER — Ambulatory Visit: Payer: 59 | Admitting: Physical Therapy

## 2023-11-04 ENCOUNTER — Encounter: Payer: Self-pay | Admitting: Physical Therapy

## 2023-11-04 DIAGNOSIS — R42 Dizziness and giddiness: Secondary | ICD-10-CM

## 2023-11-04 DIAGNOSIS — M542 Cervicalgia: Secondary | ICD-10-CM | POA: Diagnosis not present

## 2023-11-04 DIAGNOSIS — R2681 Unsteadiness on feet: Secondary | ICD-10-CM | POA: Diagnosis not present

## 2023-11-04 NOTE — Therapy (Signed)
OUTPATIENT PHYSICAL THERAPY VESTIBULAR TREATMENT     Patient Name: Marvin Espinoza MRN: [id number] DOB:12-04-1971, 52 y.o., male Today's Date: 11/06/2023  END OF SESSION:  PT End of Session - 11/06/23 0845     Visit Number 7    Number of Visits 17    Date for PT Re-Evaluation 12/11/23    Authorization Type [employer]    Authorization - Number of Visits 30    PT Start Time 0759    PT Stop Time 0841    PT Time Calculation (min) 42 min    Equipment Utilized During Treatment Gait belt    Activity Tolerance Patient tolerated treatment well    Behavior During Therapy WFL for tasks assessed/performed                   Past Medical History:  Diagnosis Date   Anxiety    Bipolar disorder (HCC)    CTS (carpal tunnel syndrome)    Depression    Diabetes mellitus without complication (HCC)    GERD (gastroesophageal reflux disease)    H/O bariatric surgery 2016   Head injury 05/06/2017   Hypothyroidism    Hypothyroidism    Sleep apnea    no longer with OSA d/t over 100 pd weight loss   Past Surgical History:  Procedure Laterality Date   ANAL FISSURE REPAIR     BARIATRIC SURGERY     CARPAL TUNNEL RELEASE Left 05/09/2015   Procedure: LEFT CARPAL TUNNEL RELEASE;  Surgeon: Cindee Salt, MD;  Location: [facility];  Service: Orthopedics;  Laterality: Left;   CARPAL TUNNEL RELEASE Left 05-09-2015   CARPAL TUNNEL RELEASE Right 06/20/2015   Procedure: RIGHT CARPAL TUNNEL RELEASE;  Surgeon: Cindee Salt, MD;  Location: [facility];  Service: Orthopedics;  Laterality: Right;  REGIONAL/FAB   CHOLECYSTECTOMY     COLONOSCOPY     UPPER GASTROINTESTINAL ENDOSCOPY     Patient Active Problem List   Diagnosis Date Noted   Concussion without loss of consciousness 10/22/2023   Burning sensation of skin 08/22/2022   Diabetes mellitus type II, controlled (HCC) 08/01/2021   Chronic constipation 12/29/2020   History of gastric surgery 03/21/2020   Overdose of  benzodiazepine, intentional self-harm, initial encounter (HCC) 01/26/2020   MDD (major depressive disorder), recurrent severe, without psychosis (HCC) 01/26/2020   Drug overdose 01/25/2020   Onychomycosis 01/05/2019   Neuropathy 01/05/2019   Severe episode of recurrent major depressive disorder, without psychotic features (HCC) 10/04/2018   OSA (obstructive sleep apnea) 09/07/2018   Bilateral hip pain 10/24/2017   Shoulder pain, bilateral 10/24/2017   Arthritis 10/24/2017   Osteoarthritis of both hips 10/24/2017   Avascular necrosis of bone of hip, right (HCC) 10/24/2017   Osteoarthritis of shoulder 10/24/2017   PTSD (post-traumatic stress disorder) 08/20/2017   Tardive dyskinesia 08/20/2017   Severe major depression (HCC) 05/10/2017   Affective psychosis, bipolar (HCC) 05/10/2017   Vision disturbance 05/06/2017   History of concussion 02/10/2017   Mood disorder (HCC) 02/10/2017   Adjustment insomnia 01/24/2017   Adjustment disorder with anxious mood 01/24/2017   GAD (generalized anxiety disorder) 01/24/2017   Gastroesophageal reflux disease without esophagitis 11/19/2016   Hypothyroidism 11/19/2016   DDD (degenerative disc disease), lumbar 11/19/2016    PCP:    Raliegh Ip, DO   REFERRING PROVIDER: Andi Devon, DO  REFERRING DIAG: A21.3Y8M (ICD-10-CM) - Concussion without loss of consciousness, initial encounter R42 (ICD-10-CM) - Dizziness and giddiness  THERAPY DIAG:  Dizziness and  giddiness  Cervicalgia  Unsteadiness on feet  ONSET DATE: 08/31/23  Rationale for Evaluation and Treatment: Rehabilitation  SUBJECTIVE:   SUBJECTIVE STATEMENT: Doing good. Nothing new.   Pt accompanied by: self and significant other  PERTINENT HISTORY: Anxiety, bipolar disorder, CTS, depression, DM, head injury 2018 Per Dr. Christella Hartigan' note: "only dizziness with nystagmus due to vestibular dysfunction likely from concussion, but could be compounded by his underlying  psychotropic medications"   PAIN:  Are you having pain? Yes: NPRS scale: 0/10 Pain location: neck Pain description: sharp Aggravating factors: neck flexion, R sidebending  Relieving factors: meds, not taking any more  PRECAUTIONS: None  RED FLAGS: None   WEIGHT BEARING RESTRICTIONS: No  FALLS: Has patient fallen in last 6 months? Yes. Number of falls only 1 causing concussion   LIVING ENVIRONMENT: Lives with: lives with their spouse Lives in: House/apartment Stairs:  2 steps to enter 2 story home but live on ground floor Has following equipment at home: Single point cane and Environmental consultant - 2 wheeled  PLOF: Independent; was working part time mowing and car detailing; currently on medical leave  PATIENT GOALS: improve dizziness   OBJECTIVE:     TODAY'S TREATMENT: 11/06/23 Activity Comments  gait + head turns/nods 4x90ft each Initially with small hesitant steps; much improved after practice and cueing; no dizziness; fairly steady throughout   fwd/back stepping + head nods Mild instability and occasional UE support or CGA required   walking EC along counter, then out in open space Tendency to drift R  walking backwards  Good stability   tandem walk fwd/back Good stability   D2 with ball 2x5 each side with quicker pace on 2nd set Good tolerance and stability   R DH Ocular oscillations but no nystagmus   R sidelying test  Ocular oscillations but no nystagmus; pt reports most dizziness upon sitting up  L sidelying test  Ocular oscillations but no nystagmus; pt reports mild dizziness upon sitting up  Long sit<>supine C/o short duration of dizziness in each position; ocular oscillations without nystagmus             HOME EXERCISE PROGRAM Access Code: [email or web address] URL: [email or web address] Date: 11/06/2023 Prepared by: [facility] - Outpatient  Rehab - [facility]  Program Notes walk inside the house 5-10 minutes 2x/day  Exercises - Brandt-Daroff Vestibular  Exercise  - 1 x daily - 5 x weekly - 2 sets - 3-5 reps - Romberg Stance on Foam Pad with Head Rotation  - 1 x daily - 5 x weekly - 2-3 sets - 30 sec hold - Romberg Stance with Head Nods on Foam Pad  - 1 x daily - 5 x weekly - 2-3 sets - 30 sec hold - Alternating Step Forward with Support  - 1 x daily - 5 x weekly - 2 sets - 10 reps - Supine to Long Sitting Vestibular Habituation  - 1 x daily - 5 x weekly - 2 sets - 3-5 reps - Long Sitting to Supine Vestibular Habituation  - 1 x daily - 5 x weekly - 2 sets - 3-5 reps   PATIENT EDUCATION: Education details: HEP update; edu on ocular oscillations vs. Nystagmus, POC- advised to drop to 1x/week d/t good progress Person educated: Patient and Spouse Education method: Explanation, Demonstration, Tactile cues, Verbal cues, and Handouts Education comprehension: verbalized understanding and returned demonstration     ------------------------------------------------------------------ Note: Objective measures below were completed at Evaluation unless otherwise noted.  DIAGNOSTIC FINDINGS: 08/31/23 head/neck CT/angio: Normal  CTA of the head and neck. No acute brain injury. 2. No acute cervical spine injury. 3. Mild left-sided neural foraminal narrowing at the C3-4 and C4-5 levels.  COGNITION: Overall cognitive status: Within functional limits for tasks assessed   SENSATION: Denies N/T  POSTURE:  rounded shoulders and forward head  PALPATION: soft tissue restriction and TTP over R>L splenius, LS, suboccipitals   Cervical ROM:    Active AROM (deg) eval  Flexion 32 *pain  Extension 33 *pain   Right lateral flexion 38   Left lateral flexion 35 *pain  Right rotation 46 *pain   Left rotation 59  (Blank rows = not tested)  GAIT: Gait pattern: narrow BOS, guarded, LOB and veering while walking  Assistive device utilized: None Level of assistance: SBA  PATIENT SURVEYS:  FOTO [phone]  VESTIBULAR ASSESSMENT:  GENERAL OBSERVATION: pt  wears bifocals or progressive lenses   OCULOMOTOR EXAM: *pt blinking repeatedly throughout oculomotor testing and reports sensation of eye movement- difficult to discern/visualize if nystagmus is actually occurring   Ocular Alignment: normal  Ocular ROM: No Limitations  Spontaneous Nystagmus: absent  Gaze-Induced Nystagmus: absent  Smooth Pursuits: saccades  Saccades:  slow and poor trajectory   Convergence/Divergence: reports clear throughout   VESTIBULAR - OCULAR REFLEX:   Slow VOR: Normal reduced control of neck movement    VOR Cancellation: Corrective Saccades to L and c/o "fuzzy"  Head-Impulse Test: HIT Right: negative HIT Left: positive     VESTIBULAR TREATMENT:                                                                                                   DATE: 10/16/23  PATIENT EDUCATION: Education details: prognosis, POC, HEP, advised to speak to MD about possibility of discontinuation of Meclizine for max vestibular recovery Person educated: Patient and Spouse Education method: Explanation, Demonstration, Tactile cues, Verbal cues, and Handouts Education comprehension: verbalized understanding and returned demonstration  HOME EXERCISE PROGRAM: Access Code: [email or web address] URL: [email or web address] Date: 10/16/2023 Prepared by: [facility] - Outpatient  Rehab - [facility]  Program Notes walk inside the house 5-10 minutes 2x/day  Exercises - Seated Shoulder Shrug Circles AROM Backward  - 1 x daily - 5 x weekly - 2 sets - 10 reps - Seated Shoulder Shrug Circles AROM Forward  - 1 x daily - 5 x weekly - 2 sets - 10 reps - Gentle Levator Scapulae Stretch  - 1 x daily - 5 x weekly - 2 sets - 20 sec hold - Seated Cervical Rotation AROM  - 1 x daily - 5 x weekly - 2 sets - 10 reps  GOALS: Goals reviewed with patient? Yes  SHORT TERM GOALS: Target date: 11/06/2023  Patient to be independent with initial HEP. Baseline: HEP initiated Goal status: IN  PROGRESS    LONG TERM GOALS: Target date: 12/11/2023  Patient to be independent with advanced HEP. 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  Baseline: Unable Goal status: IN PROGRESS  Patient will report 0/10  dizziness with bed mobility and transfers.  Baseline: Symptomatic  Goal status: IN PROGRESS  Patient to score at least 23/30 on FGA in order to decrease risk of falls. Baseline: NT Goal status: IN PROGRESS  Patient to report resolution of neck pain,.  Baseline: - Goal status: IN PROGRESS  Patient to score at least 59 on FOTO in order to indicate improved functional outcomes.  Baseline: 48 Goal status: IN PROGRESS    ASSESSMENT:  CLINICAL IMPRESSION: Patient arrived to session without complaints. Increase challenge with balance activities which were performed with great tolerance and good overall stability. Mild imbalance evident with dynamic head nods, this updated this into HEP. Positional testing revealed no nystagmus, however ocular oscillations persist. Patient reports symptoms when lying long sitting<>supine which did bring on c/o dizziness but again without nystagmus present. Updated HEP to habituate these movements. Patient is showing quick progress, now only with positional dizziness r4emaining. Dropping down to 1x/week d/t good progress towards goals.   OBJECTIVE IMPAIRMENTS: Abnormal gait, decreased activity tolerance, decreased balance, decreased ROM, dizziness, increased muscle spasms, impaired flexibility, postural dysfunction, and pain.   ACTIVITY LIMITATIONS: carrying, lifting, bending, sitting, standing, squatting, sleeping, stairs, transfers, bed mobility, bathing, toileting, dressing, reach over head, hygiene/grooming, and locomotion level  PARTICIPATION LIMITATIONS: meal prep, cleaning, laundry, driving, shopping, community activity, occupation, yard  work, and church  PERSONAL FACTORS: Age, Behavior pattern, Past/current experiences, Time since onset of injury/illness/exacerbation, and 3+ comorbidities: Anxiety, bipolar disorder, CTS, depression, DM, head injury 2018  are also affecting patient's functional outcome.   REHAB POTENTIAL: Good  CLINICAL DECISION MAKING: Evolving/moderate complexity  EVALUATION COMPLEXITY: Moderate   PLAN:  PT FREQUENCY: 1-2x/week  PT DURATION: 8 weeks  PLANNED INTERVENTIONS: 97164- PT Re-evaluation, 97110-Therapeutic exercises, 97530- Therapeutic activity, [id number]- Neuromuscular re-education, 97535- Self Care, 42595- Manual therapy, [id number]- Gait training, [phone]- Canalith repositioning, [id number]- Aquatic Therapy, [phone]- Electrical stimulation (manual), Patient/Family education, Balance training, Stair training, Taping, Dry Needling, Joint mobilization, Spinal mobilization, Vestibular training, Cryotherapy, and Moist heat  PLAN FOR NEXT SESSION: Repeat positional testing and treat as appropriate.  Add balance and dizziness exercises into HEP  Anette Guarneri, PT, DPT 11/06/23 8:45 AM  [facility] [address] [place], [address] [place], [place] [address] Phone # [phone] Fax # [phone]

## 2023-11-04 NOTE — Progress Notes (Unsigned)
Subjective:   I, [name], PhD, LAT, ATC acting as a scribe for [name], MD.  Chief Complaint: Marvin Espinoza,  is a 52 y.o. male who presents for f/u concussion. Pt slipped on some water in a [employer] bathroom, hitting his head on the floor. He was transported by EMS to the [facility]. Pt was last seen by Dr. Denyse Amass on 10/22/23 and was advised to cont w/ vestibular therapy, completing 6 visits, and communication was established w/ his psychiatrist.   Today, pt reports still stumbling intermittently. He c/o cont'd dizziness when laying down and transitioning. He is afraid to walk in the back field in his yard due to fear of tripping. He expresses fear when being in a bathroom of tripping and falling again. PT advised to not take the meclizine.  He has independently stopped his Zyprexa and Prozac.  They were causing significant weight gain and worsening diabetes.  He has been on other medicines before for his mood and either caused tardive dyskinesia or caused weight gain.  He has had a hard time finding something that worked well.  He has not told his psychiatrist yet.  Dx imaging: 08/31/23 Head, C-spine, L-spine, & head/neck angio CT and R elbow XR   Injury date: 08/31/23 Visit #: 2  History of Present Illness:   Concussion Self-Reported Symptom Score Symptoms rated on a scale 1-6, in last 24 hours  Headache: 0   Pressure in head: 0 Neck pain: 2 Nausea or vomiting: 0 Dizziness: 3  Blurred vision: 0  Balance problems: 3 Sensitivity to light:  0 Sensitivity to noise: 0 Feeling slowed down: 0 Feeling like "in a fog": 3 "Don't feel right": 2 Difficulty concentrating: 6 Difficulty remembering: 6 Fatigue or low energy: 4 Confusion: 6 Drowsiness: 0 More emotional: 0 Irritability: 4 Sadness: 2 Nervous or anxious: 5 Trouble falling asleep: 0   Total # of Symptoms: 12/22 Total Symptom Score: 46/132  Previous Total # of Symptoms: 18/22 Previous Symptom Score:  76/132  Tinnitus: No  Review of Systems: No fevers or chills    Review of History: Diabetes, depression and bipolar disorder.  Objective:    Physical Examination Vitals:   11/05/23 0756  BP: 112/82  Pulse: 69  SpO2: 98%   MSK: Normal cervical motion Neuro: Alert and oriented normal coordination and gait. Very slight mouth chewing motion Psych: Normal speech thought process and affect.  No SI or HI expressed.    Assessment and Plan   52 y.o. male with concussion.  Symptoms are significantly improving with vestibular physical therapy and time.  He continues to have difficulty with concentration and attention.  His mood is controlled but he just stopped his Zyprexa and Prozac.  I am a little worried that his mood may worsen and I am concerned about adding any other medications to his psychiatric last.  Recommend he contact his psychiatrist to discuss possible other options.  Additionally recommend that he keep track of his blood sugar as he may need less insulin now that he is off of Zyprexa.  Recheck in 1 month or sooner if needed.       Action/Discussion: Reviewed diagnosis, management options, expected outcomes, and the reasons for scheduled and emergent follow-up. Questions were adequately answered. Patient expressed verbal understanding and agreement with the following plan.     Patient Education: Reviewed with patient the risks (i.e, a repeat concussion, post-concussion syndrome, second-impact syndrome) of returning to play prior to complete resolution, and thoroughly reviewed the  signs and symptoms of concussion.Reviewed need for complete resolution of all symptoms, with rest AND exertion, prior to return to play. Reviewed red flags for urgent medical evaluation: worsening symptoms, nausea/vomiting, intractable headache, musculoskeletal changes, focal neurological deficits. [facility] Concussion Care Plan, which clearly outlines the plans stated  above, was given to patient.   Level of service: Total encounter time 30 minutes including face-to-face time with the patient and, reviewing past medical record, and charting on the date of service.        After Visit Summary printed out and provided to patient as appropriate.  The above documentation has been reviewed and is accurate and complete [name]

## 2023-11-05 ENCOUNTER — Ambulatory Visit (INDEPENDENT_AMBULATORY_CARE_PROVIDER_SITE_OTHER): Payer: 59 | Admitting: Family Medicine

## 2023-11-05 ENCOUNTER — Encounter: Payer: Self-pay | Admitting: *Deleted

## 2023-11-05 ENCOUNTER — Ambulatory Visit: Payer: Self-pay | Admitting: *Deleted

## 2023-11-05 VITALS — BP 112/82 | HR 69 | Ht 70.0 in | Wt 271.0 lb

## 2023-11-05 DIAGNOSIS — R42 Dizziness and giddiness: Secondary | ICD-10-CM | POA: Diagnosis not present

## 2023-11-05 DIAGNOSIS — H539 Unspecified visual disturbance: Secondary | ICD-10-CM

## 2023-11-05 DIAGNOSIS — F315 Bipolar disorder, current episode depressed, severe, with psychotic features: Secondary | ICD-10-CM | POA: Diagnosis not present

## 2023-11-05 DIAGNOSIS — S060X0D Concussion without loss of consciousness, subsequent encounter: Secondary | ICD-10-CM

## 2023-11-05 NOTE — Patient Instructions (Signed)
Thank you for coming in today.   For the concussion I think you are heading in the right direction.   Let your psychiatrist know about Zyprexa and Fluoxetine.   Pay attention to you blood sugar as you stop the Zyprexa. You likely will need less insulin.   Recheck in 1 month. Recheck sooner if you are having set backs or problems.

## 2023-11-05 NOTE — Patient Instructions (Signed)
Visit Information  Thank you for taking time to visit with me today. Please don't hesitate to contact me if I can be of assistance to you.   Following are the goals we discussed today:   Goals Addressed               This Visit's Progress     Reduce & Manage Symptoms of Anxiety & Depression. (pt-stated)   On track     Care Coordination Interventions:  Interventions Today    Flowsheet Row Most Recent Value  Chronic Disease   Chronic disease during today's visit Diabetes, Other  [Adjustment Disorder w/Anxious Mood, Adjustment Insomnia, Bipolar Affective Psychosis, Drug Overdose, Generalized Anxiety Disorder, History of Concussion, Recurrent Severe Major Depressive Disorder, Mood Disorder, PTSD, Neuropathy, Tardive Dyskinesia]  General Interventions   General Interventions Discussed/Reviewed General Interventions Discussed, Doctor Visits, Durable Medical Equipment (DME), General Interventions Reviewed, Annual Eye Exam, Labs, Annual Foot Exam, Vaccines, Health Screening, Community Resources, Communication with  [Communication with Care Team Members]  Labs Hgb A1c every 3 months, Kidney Function  [Encouraged]  Vaccines COVID-19, Flu, Pneumonia, RSV, Shingles, Tetanus/Pertussis/Diphtheria  [Encouraged]  Doctor Visits Discussed/Reviewed Doctor Visits Discussed, Doctor Visits Reviewed, Annual Wellness Visits, PCP, Specialist  [Encouraged Collaboration]  Health Screening Bone Density, Colonoscopy, Prostate  [Encouraged]  Durable Medical Equipment (DME) Glucomoter, BP Cuff, Other  [Prescription Eyeglasses, Hand-Held Shower Hose, Engineer, materials in Air traffic controller, Cane]  PCP/Specialist Visits Compliance with follow-up visit  [Encouraged]  Communication with PCP/Specialists, Charity fundraiser, Pharmacists, Social Work  [Encouraged]  Exercise Interventions   Exercise Discussed/Reviewed Exercise Discussed, Exercise Reviewed, Physical Activity, Weight Managment, Assistive device use and maintanence  [Encouraged]  Physical  Activity Discussed/Reviewed Physical Activity Discussed, Physical Activity Reviewed, Types of exercise, Home Exercise Program (HEP), PREP, Gym  [Encouraged]  Weight Management Weight loss  [Encouraged]  Education Interventions   Education Provided Provided Therapist, sports, Provided Web-based Education, Provided Education  Provided Engineer, petroleum On Nutrition, Exercise, Medication, Foot Care, Eye Care, When to see the doctor, Labs, Blood Sugar Monitoring, Mental Health/Coping with Illness, Applications, [employer], [employer]  Labs Reviewed Hgb A1c  Mental Health Interventions   Mental Health Discussed/Reviewed Mental Health Discussed, Substance Abuse, Suicide, Mental Health Reviewed, Coping Strategies, Crisis, Anxiety, Depression, Grief and Loss  [Assessed]  Nutrition Interventions   Nutrition Discussed/Reviewed Nutrition Discussed, Portion sizes, Decreasing sugar intake, Nutrition Reviewed, Carbohydrate meal planning, Increasing proteins, Decreasing fats, Adding fruits and vegetables, Decreasing salt, Fluid intake  [Encouraged]  Pharmacy Interventions   Pharmacy Dicussed/Reviewed Pharmacy Topics Discussed, Affording Medications, Pharmacy Topics Reviewed, Medications and their functions, Medication Adherence  [Encouraged]  Medication Adherence --  [Medication Compliance]  Safety Interventions   Safety Discussed/Reviewed Safety Discussed, Safety Reviewed  [Encouraged]  Home Safety Assistive Devices, Refer for community resources  Advanced Directive Interventions   Advanced Directives Discussed/Reviewed Advanced Directives Discussed  [Encouraged Initiated]      Active Listening & Reflection Utilized.  Verbalization of Feelings Encouraged.  Emotional Support Provided.   Solution-Focused Strategies Employed. Acceptance & Commitment Therapy Initiated. Cognitive Behavioral Therapy Implemented. Client-Centered Therapy Performed.  Encouraged Continued Engagement with Community education officer, Services, Programs & Resources of Interest in [place], [place], from List Provided: ~ [employer] [phone]# [phone]) ~ [employer] 4108527054) ~ Lot ([phone] (# 443 674 5611) ~ [facility] Department of Social Services [phone]# [phone]) ~ [employer] [facility] [place] 754-183-2384) ~ [employer] ([phone]# [phone]) ~ [employer] 6407587932) ~ [facility] [place] (# 843-133-9414) ~ [employer] 754-188-4404# 650-803-6692) ~  [employer] 978-859-7982) ~ Aging, Disability & Transit Services of [place]  (# (774)277-4634) ~ Energy Program (501) 192-4752) ~ Low Income Energy Assistance Program 7805912228# 940-164-5066) ~ [employer] (859)388-2834)  Encouraged Initiation of Applications for [employer] [place] [place] [employer], Services, Programs & Resources of Interest, from List Provided:          ~ Advertising account executive          ~ Energy Programs Application          ~ [employer] Financial Assistance Program Application Encouraged Continued Engagement with [employer] [place] [place] [employer], Services, Programs & Resources of Interest in [place], [place], from List Provided:          ~ Librarian, academic [phone]) ~ Games developer Program (# 480-236-6379)   ~ [employer] [phone]) Encouraged Routine Engagement with Dr. Milagros Evener, Psychiatrist with [facility] [phone]), to Receive Psychotropic Medication Administration & Management, in An Effort to Reduce & Manage Symptoms of Anxiety, Depression & Panic Attacks. Encouraged Routine Engagement with Brain Lynnell Chad, Licensed Clinical Social Worker with [facility] [phone]), to Receive Psychotherapeutic Counseling & Supportive Services, in An Effort to Reduce & Manage Symptoms of Anxiety, Depression & Panic Attacks.  Encouraged Routine Engagement with  Danford Bad, Licensed Clinical Social Worker with [facility] [phone]), if You Have Questions, Need Assistance, or If Additional Social Work Needs Are Identified Between Now & Our Next Follow-Up Outreach Call, Scheduled on 11/26/2023 at 10:30 AM.      Our next appointment is by telephone on 11/26/2023 at 10:30 am.  Please call the care guide team at [phone] if you need to cancel or reschedule your appointment.   If you are experiencing a Mental Health or Behavioral Health Crisis or need someone to talk to, please call the Suicide and Crisis Lifeline: 988 call the [place] National Suicide Prevention Lifeline: (581)802-0409 or TTY: 8076723993 TTY 807-358-4402) to talk to a trained counselor call 1-800-273-TALK (toll free, 24 hour hotline) go to [facility] [address], [place] [phone]) call the [facility] Crisis Line: 807-491-7835 call 911  Patient verbalizes understanding of instructions and care plan provided today and agrees to view in MyChart. Active MyChart status and patient understanding of how to access instructions and care plan via MyChart confirmed with patient.     Telephone follow up appointment with care management team member scheduled for:  11/26/2023 at 10:30 am.  Danford Bad, BSW, MSW, LCSW  Embedded Practice Social Work Case Manager  [facility], Population Health Direct Dial: [phone]  Fax: [phone] Email: [email or web address] Website: [email or web address]

## 2023-11-05 NOTE — Patient Outreach (Signed)
Care Coordination   Follow Up Visit Note   11/05/2023  Name: Marvin Espinoza MRN: [id number] DOB: 22-Aug-1971  Marvin Espinoza is a 52 y.o. year old male who sees Raliegh Ip, DO for primary care. I spoke with [name] by phone today.  What matters to the patients health and wellness today?  Reduce & Manage Symptoms of Anxiety & Depression.    Goals Addressed               This Visit's Progress     Reduce & Manage Symptoms of Anxiety & Depression. (pt-stated)   On track     Care Coordination Interventions:  Interventions Today    Flowsheet Row Most Recent Value  Chronic Disease   Chronic disease during today's visit Diabetes, Other  [Adjustment Disorder w/Anxious Mood, Adjustment Insomnia, Bipolar Affective Psychosis, Drug Overdose, Generalized Anxiety Disorder, History of Concussion, Recurrent Severe Major Depressive Disorder, Mood Disorder, PTSD, Neuropathy, Tardive Dyskinesia]  General Interventions   General Interventions Discussed/Reviewed General Interventions Discussed, Doctor Visits, Durable Medical Equipment (DME), General Interventions Reviewed, Annual Eye Exam, Labs, Annual Foot Exam, Vaccines, Health Screening, Community Resources, Communication with  [Communication with Care Team Members]  Labs Hgb A1c every 3 months, Kidney Function  [Encouraged]  Vaccines COVID-19, Flu, Pneumonia, RSV, Shingles, Tetanus/Pertussis/Diphtheria  [Encouraged]  Doctor Visits Discussed/Reviewed Doctor Visits Discussed, Doctor Visits Reviewed, Annual Wellness Visits, PCP, Specialist  [Encouraged Collaboration]  Health Screening Bone Density, Colonoscopy, Prostate  [Encouraged]  Durable Medical Equipment (DME) Glucomoter, BP Cuff, Other  [Prescription Eyeglasses, Hand-Held Shower Hose, Engineer, materials in Air traffic controller, Cane]  PCP/Specialist Visits Compliance with follow-up visit  [Encouraged]  Communication with PCP/Specialists, Charity fundraiser, Pharmacists, Social Work  [Encouraged]  Exercise  Interventions   Exercise Discussed/Reviewed Exercise Discussed, Exercise Reviewed, Physical Activity, Weight Managment, Assistive device use and maintanence  [Encouraged]  Physical Activity Discussed/Reviewed Physical Activity Discussed, Physical Activity Reviewed, Types of exercise, Home Exercise Program (HEP), PREP, Gym  [Encouraged]  Weight Management Weight loss  [Encouraged]  Education Interventions   Education Provided Provided Therapist, sports, Provided Web-based Education, Provided Education  Provided Engineer, petroleum On Nutrition, Exercise, Medication, Foot Care, Eye Care, When to see the doctor, Labs, Blood Sugar Monitoring, Mental Health/Coping with Illness, Applications, [employer], [employer]  Labs Reviewed Hgb A1c  Mental Health Interventions   Mental Health Discussed/Reviewed Mental Health Discussed, Substance Abuse, Suicide, Mental Health Reviewed, Coping Strategies, Crisis, Anxiety, Depression, Grief and Loss  [Assessed]  Nutrition Interventions   Nutrition Discussed/Reviewed Nutrition Discussed, Portion sizes, Decreasing sugar intake, Nutrition Reviewed, Carbohydrate meal planning, Increasing proteins, Decreasing fats, Adding fruits and vegetables, Decreasing salt, Fluid intake  [Encouraged]  Pharmacy Interventions   Pharmacy Dicussed/Reviewed Pharmacy Topics Discussed, Affording Medications, Pharmacy Topics Reviewed, Medications and their functions, Medication Adherence  [Encouraged]  Medication Adherence --  [Medication Compliance]  Safety Interventions   Safety Discussed/Reviewed Safety Discussed, Safety Reviewed  [Encouraged]  Home Safety Assistive Devices, Refer for community resources  Advanced Directive Interventions   Advanced Directives Discussed/Reviewed Advanced Directives Discussed  [Encouraged Initiated]      Active Listening & Reflection Utilized.  Verbalization of Feelings Encouraged.  Emotional Support Provided.   Solution-Focused Strategies  Employed. Acceptance & Commitment Therapy Initiated. Cognitive Behavioral Therapy Implemented. Client-Centered Therapy Performed.  Encouraged Continued Engagement with Film/video editor, Services, [employer] of Interest in [place], [place], from List Provided: ~ [employer] [phone]) ~ [employer] (540)099-9603) ~ Lot ([phone] (# 343 261 7720) ~ [facility] Department of Social Services (#  ([phone]) ~ [employer] of [place] 916-530-5240) ~ [employer] 506-013-8865# ([phone]) ~ [employer] (825)068-1392) ~ [facility] [place] (# 802-362-4171) ~ [employer] (# 219-256-4872) ~ [employer] 670-611-6412) ~ Aging, Disability & Transit Services of [place]  (# (727)863-5877) ~ Energy Program (346)097-6040) ~ Low Income Energy Assistance Program 623-673-8790# 551-633-2712) ~ [employer] [phone]) [phone])  Encouraged Initiation of Applications for [employer] [place] [employer], Services, Programs & Resources of Interest, from List Provided:         ~ Advertising account executive         ~ Energy Programs Application         ~ [employer] Financial Assistance Program Application Encouraged Continued Engagement with [employer] [place] [place] [employer], Services, Programs & Resources of Interest in [place], [place], from List Provided:          ~ Librarian, academic ([phone]) ~ Games developer Program (# 307-872-2488)   ~ [employer] 2206168318) Encouraged Routine Engagement with Dr. Milagros Evener, Psychiatrist with [facility] [phone]), to Receive Psychotropic Medication Administration & Management, in An Effort to Reduce & Manage Symptoms of Anxiety, Depression & Panic Attacks. Encouraged Routine Engagement with Brain Lynnell Chad, Licensed Clinical Social Worker with [facility] [phone]),  to Receive Psychotherapeutic Counseling & Supportive Services, in An Effort to Reduce & Manage Symptoms of Anxiety, Depression & Panic Attacks.  Encouraged Routine Engagement with Danford Bad, Licensed Clinical Social Worker with [facility] [phone]), if You Have Questions, Need Assistance, or If Additional Social Work Needs Are Identified Between Now & Our Next Follow-Up Outreach Call, Scheduled on 11/26/2023 at 10:30 AM.        SDOH assessments and interventions completed:  Yes.  SDOH Interventions Today    Flowsheet Row Most Recent Value  SDOH Interventions   Food Insecurity Interventions Intervention Not Indicated  Housing Interventions Intervention Not Indicated  Transportation Interventions Intervention Not Indicated, Patient Resources (Friends/Family)  Utilities Interventions Intervention Not Indicated  Alcohol Usage Interventions Intervention Not Indicated (Score <7)  Depression Interventions/Treatment  Referral to Psychiatry, Medication, Counseling, Currently on Treatment  Financial Strain Interventions Intervention Not Indicated  Physical Activity Interventions Intervention Not Indicated  Stress Interventions Offered [employer] Resources, Provide Counseling, Other (Comment)  [Currently Enrolled in Intensive Outpatient Treatment Program]  Social Connections Interventions Intervention Not Indicated  Health Literacy Interventions Intervention Not Indicated     Care Coordination Interventions:  Yes, provided.   Follow up plan: Follow up call scheduled for 11/26/2023 at 10:30 am.  Encounter Outcome:  Patient Visit Completed.   Danford Bad, BSW, MSW, Printmaker Social Work Case Set designer Health  [facility], Population Health Direct Dial: [phone]  Fax: [phone] Email: [email or web address] Website: [email or web address]

## 2023-11-06 ENCOUNTER — Encounter: Payer: Self-pay | Admitting: Physical Therapy

## 2023-11-06 ENCOUNTER — Ambulatory Visit: Payer: 59 | Admitting: Physical Therapy

## 2023-11-06 DIAGNOSIS — R2681 Unsteadiness on feet: Secondary | ICD-10-CM

## 2023-11-06 DIAGNOSIS — R42 Dizziness and giddiness: Secondary | ICD-10-CM

## 2023-11-06 DIAGNOSIS — M542 Cervicalgia: Secondary | ICD-10-CM

## 2023-11-10 ENCOUNTER — Other Ambulatory Visit: Payer: Self-pay | Admitting: Family Medicine

## 2023-11-10 DIAGNOSIS — K2100 Gastro-esophageal reflux disease with esophagitis, without bleeding: Secondary | ICD-10-CM

## 2023-11-10 DIAGNOSIS — Z87820 Personal history of traumatic brain injury: Secondary | ICD-10-CM

## 2023-11-10 NOTE — Therapy (Signed)
OUTPATIENT PHYSICAL THERAPY VESTIBULAR TREATMENT     Patient Name: Marvin Espinoza MRN: [id number] DOB:03-May-1971, 52 y.o., male Today's Date: 11/11/2023  END OF SESSION:  PT End of Session - 11/11/23 0841     Visit Number 8    Number of Visits 17    Date for PT Re-Evaluation 12/11/23    Authorization Type [employer]    Authorization - Number of Visits 30    PT Start Time 0758    PT Stop Time 0839    PT Time Calculation (min) 41 min    Activity Tolerance Patient tolerated treatment well    Behavior During Therapy [facility] for tasks assessed/performed                    Past Medical History:  Diagnosis Date   Anxiety    Bipolar disorder (HCC)    CTS (carpal tunnel syndrome)    Depression    Diabetes mellitus without complication (HCC)    GERD (gastroesophageal reflux disease)    H/O bariatric surgery 2016   Head injury 05/06/2017   Hypothyroidism    Hypothyroidism    Sleep apnea    no longer with OSA d/t over 100 pd weight loss   Past Surgical History:  Procedure Laterality Date   ANAL FISSURE REPAIR     BARIATRIC SURGERY     CARPAL TUNNEL RELEASE Left 05/09/2015   Procedure: LEFT CARPAL TUNNEL RELEASE;  Surgeon: Cindee Salt, MD;  Location: [facility];  Service: Orthopedics;  Laterality: Left;   CARPAL TUNNEL RELEASE Left 05-09-2015   CARPAL TUNNEL RELEASE Right 06/20/2015   Procedure: RIGHT CARPAL TUNNEL RELEASE;  Surgeon: Cindee Salt, MD;  Location: [facility];  Service: Orthopedics;  Laterality: Right;  REGIONAL/FAB   CHOLECYSTECTOMY     COLONOSCOPY     UPPER GASTROINTESTINAL ENDOSCOPY     Patient Active Problem List   Diagnosis Date Noted   Concussion without loss of consciousness 10/22/2023   Burning sensation of skin 08/22/2022   Diabetes mellitus type II, controlled (HCC) 08/01/2021   Chronic constipation 12/29/2020   History of gastric surgery 03/21/2020   Overdose of benzodiazepine, intentional self-harm, initial encounter  (HCC) 01/26/2020   MDD (major depressive disorder), recurrent severe, without psychosis (HCC) 01/26/2020   Drug overdose 01/25/2020   Onychomycosis 01/05/2019   Neuropathy 01/05/2019   Severe episode of recurrent major depressive disorder, without psychotic features (HCC) 10/04/2018   OSA (obstructive sleep apnea) 09/07/2018   Bilateral hip pain 10/24/2017   Shoulder pain, bilateral 10/24/2017   Arthritis 10/24/2017   Osteoarthritis of both hips 10/24/2017   Avascular necrosis of bone of hip, right (HCC) 10/24/2017   Osteoarthritis of shoulder 10/24/2017   PTSD (post-traumatic stress disorder) 08/20/2017   Tardive dyskinesia 08/20/2017   Severe major depression (HCC) 05/10/2017   Affective psychosis, bipolar (HCC) 05/10/2017   Vision disturbance 05/06/2017   History of concussion 02/10/2017   Mood disorder (HCC) 02/10/2017   Adjustment insomnia 01/24/2017   Adjustment disorder with anxious mood 01/24/2017   GAD (generalized anxiety disorder) 01/24/2017   Gastroesophageal reflux disease without esophagitis 11/19/2016   Hypothyroidism 11/19/2016   DDD (degenerative disc disease), lumbar 11/19/2016    PCP:    Raliegh Ip, DO   REFERRING PROVIDER: Andi Devon, DO  REFERRING DIAG: W09.8J1B (ICD-10-CM) - Concussion without loss of consciousness, initial encounter R42 (ICD-10-CM) - Dizziness and giddiness  THERAPY DIAG:  Dizziness and giddiness  Cervicalgia  Unsteadiness on feet  ONSET DATE: 08/31/23  Rationale for Evaluation and Treatment: Rehabilitation  SUBJECTIVE:   SUBJECTIVE STATEMENT: Was blowing leaves over the weekend and did not feel well afterwards (dizziness, imbalance, dragging feet). Reports that the room started flipping again when he was lying down.   Pt accompanied by: self and significant other  PERTINENT HISTORY: Anxiety, bipolar disorder, CTS, depression, DM, head injury 2018 Per Dr. Christella Hartigan' note: "only dizziness with nystagmus due to  vestibular dysfunction likely from concussion, but could be compounded by his underlying psychotropic medications"   PAIN:  Are you having pain? Yes: NPRS scale: 0/10 Pain location: neck Pain description: sharp Aggravating factors: neck flexion, R sidebending  Relieving factors: meds, not taking any more  PRECAUTIONS: None  RED FLAGS: None   WEIGHT BEARING RESTRICTIONS: No  FALLS: Has patient fallen in last 6 months? Yes. Number of falls only 1 causing concussion   LIVING ENVIRONMENT: Lives with: lives with their spouse Lives in: House/apartment Stairs:  2 steps to enter 2 story home but live on ground floor Has following equipment at home: Single point cane and Environmental consultant - 2 wheeled  PLOF: Independent; was working part time mowing and car detailing; currently on medical leave  PATIENT GOALS: improve dizziness   OBJECTIVE:    TODAY'S TREATMENT: 11/11/23 Activity Comments  L DH L upbeating torsional nystagmus lasting ~10 sec followed by ocular oscillations   R DH Negative; oscillations still evident; upon sitting up from this position, pt had episode of dizziness and L pulsion   Universal repositioning maneuver L Tolerated well; some difficulty getting head into intended position   L DH negative  R DH Pt asymptomatic; small amplitude R upbeating torsional nystagmus lasting ~5-7 sec followed by ocular oscillations. Again with very delayed (1-2 min) c/o dizziness and L pulsion   Universal repositioning maneuver R Ocular oscillations and delayed R upbeating torsional nystagmus lasting ~25 sec in 1st position  R DH C/o "foggy" upon lying down and sitting up. No lateral pulsion upon sitting up                PATIENT EDUCATION: Education details: edu on remaining impairments, discussion on return to pt's normal activities (he states he has returned to all yardwork and hunting) Person educated: Patient and Spouse Education method: Explanation Education comprehension: verbalized  understanding   HOME EXERCISE PROGRAM Access Code: [email or web address] URL: [email or web address] Date: 11/06/2023 Prepared by: [facility] - Outpatient  Rehab - [facility]  Program Notes walk inside the house 5-10 minutes 2x/day  Exercises - Brandt-Daroff Vestibular Exercise  - 1 x daily - 5 x weekly - 2 sets - 3-5 reps - Romberg Stance on Foam Pad with Head Rotation  - 1 x daily - 5 x weekly - 2-3 sets - 30 sec hold - Romberg Stance with Head Nods on Foam Pad  - 1 x daily - 5 x weekly - 2-3 sets - 30 sec hold - Alternating Step Forward with Support  - 1 x daily - 5 x weekly - 2 sets - 10 reps - Supine to Long Sitting Vestibular Habituation  - 1 x daily - 5 x weekly - 2 sets - 3-5 reps - Long Sitting to Supine Vestibular Habituation  - 1 x daily - 5 x weekly - 2 sets - 3-5 reps     ------------------------------------------------------------------ Note: Objective measures below were completed at Evaluation unless otherwise noted.  DIAGNOSTIC FINDINGS: 08/31/23 head/neck CT/angio: Normal CTA of the head and neck. No acute brain injury.  2. No acute cervical spine injury. 3. Mild left-sided neural foraminal narrowing at the C3-4 and C4-5 levels.  COGNITION: Overall cognitive status: Within functional limits for tasks assessed   SENSATION: Denies N/T  POSTURE:  rounded shoulders and forward head  PALPATION: soft tissue restriction and TTP over R>L splenius, LS, suboccipitals   Cervical ROM:    Active AROM (deg) eval  Flexion 32 *pain  Extension 33 *pain   Right lateral flexion 38   Left lateral flexion 35 *pain  Right rotation 46 *pain   Left rotation 59  (Blank rows = not tested)  GAIT: Gait pattern: narrow BOS, guarded, LOB and veering while walking  Assistive device utilized: None Level of assistance: SBA  PATIENT SURVEYS:  FOTO [phone]  VESTIBULAR ASSESSMENT:  GENERAL OBSERVATION: pt wears bifocals or progressive lenses   OCULOMOTOR EXAM: *pt  blinking repeatedly throughout oculomotor testing and reports sensation of eye movement- difficult to discern/visualize if nystagmus is actually occurring   Ocular Alignment: normal  Ocular ROM: No Limitations  Spontaneous Nystagmus: absent  Gaze-Induced Nystagmus: absent  Smooth Pursuits: saccades  Saccades:  slow and poor trajectory   Convergence/Divergence: reports clear throughout   VESTIBULAR - OCULAR REFLEX:   Slow VOR: Normal reduced control of neck movement    VOR Cancellation: Corrective Saccades to L and c/o "fuzzy"  Head-Impulse Test: HIT Right: negative HIT Left: positive     VESTIBULAR TREATMENT:                                                                                                   DATE: 10/16/23  PATIENT EDUCATION: Education details: prognosis, POC, HEP, advised to speak to MD about possibility of discontinuation of Meclizine for max vestibular recovery Person educated: Patient and Spouse Education method: Explanation, Demonstration, Tactile cues, Verbal cues, and Handouts Education comprehension: verbalized understanding and returned demonstration  HOME EXERCISE PROGRAM: Access Code: [email or web address] URL: [email or web address] Date: 10/16/2023 Prepared by: [facility] - Outpatient  Rehab - [facility]  Program Notes walk inside the house 5-10 minutes 2x/day  Exercises - Seated Shoulder Shrug Circles AROM Backward  - 1 x daily - 5 x weekly - 2 sets - 10 reps - Seated Shoulder Shrug Circles AROM Forward  - 1 x daily - 5 x weekly - 2 sets - 10 reps - Gentle Levator Scapulae Stretch  - 1 x daily - 5 x weekly - 2 sets - 20 sec hold - Seated Cervical Rotation AROM  - 1 x daily - 5 x weekly - 2 sets - 10 reps  GOALS: Goals reviewed with patient? Yes  SHORT TERM GOALS: Target date: 11/06/2023  Patient to be independent with initial HEP. Baseline: HEP initiated Goal status: IN PROGRESS    LONG TERM GOALS: Target date:  12/11/2023  Patient to be independent with advanced HEP. 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  Baseline: Unable Goal status: IN PROGRESS  Patient will report 0/10 dizziness with bed mobility and transfers.  Baseline: Symptomatic  Goal status: IN PROGRESS  Patient to score at least 23/30 on FGA in order to decrease risk of falls. Baseline: NT Goal status: IN PROGRESS  Patient to report resolution of neck pain,.  Baseline: - Goal status: IN PROGRESS  Patient to score at least 59 on FOTO in order to indicate improved functional outcomes.  Baseline: 48 Goal status: IN PROGRESS    ASSESSMENT:  CLINICAL IMPRESSION: Patient arrived to session with report of remaining dizziness and notes increased dizziness and imbalance after yardwork over the weekends. Positional testing today revealed symptomatic R and L DH. Sitting up from R [facility] revealed delayed L pulsion upon sitting up. Treated patient with universal repositioning maneuver to B sides. Retesting was less symptomatic and patient without episode of L pulsion upon sitting up. No complaints upon leaving.   OBJECTIVE IMPAIRMENTS: Abnormal gait, decreased activity tolerance, decreased balance, decreased ROM, dizziness, increased muscle spasms, impaired flexibility, postural dysfunction, and pain.   ACTIVITY LIMITATIONS: carrying, lifting, bending, sitting, standing, squatting, sleeping, stairs, transfers, bed mobility, bathing, toileting, dressing, reach over head, hygiene/grooming, and locomotion level  PARTICIPATION LIMITATIONS: meal prep, cleaning, laundry, driving, shopping, community activity, occupation, yard work, and church  PERSONAL FACTORS: Age, Behavior pattern, Past/current experiences, Time since onset of injury/illness/exacerbation, and 3+ comorbidities: Anxiety, bipolar disorder, CTS, depression, DM, head injury 2018   are also affecting patient's functional outcome.   REHAB POTENTIAL: Good  CLINICAL DECISION MAKING: Evolving/moderate complexity  EVALUATION COMPLEXITY: Moderate   PLAN:  PT FREQUENCY: 1-2x/week  PT DURATION: 8 weeks  PLANNED INTERVENTIONS: 97164- PT Re-evaluation, 97110-Therapeutic exercises, 97530- Therapeutic activity, [id number]- Neuromuscular re-education, 97535- Self Care, 16109- Manual therapy, [id number]- Gait training, [phone]- Canalith repositioning, [id number]- Aquatic Therapy, [phone]- Electrical stimulation (manual), Patient/Family education, Balance training, Stair training, Taping, Dry Needling, Joint mobilization, Spinal mobilization, Vestibular training, Cryotherapy, and Moist heat  PLAN FOR NEXT SESSION: Repeat positional testing and treat as appropriate.  Add balance and dizziness exercises into HEP  Anette Guarneri, PT, DPT 11/11/23 8:42 AM  [facility] Outpatient Rehab at [facility] [address] [place], [address] [place], [place] [address] Phone # [phone] Fax # [phone]

## 2023-11-11 ENCOUNTER — Encounter: Payer: Self-pay | Admitting: Physical Therapy

## 2023-11-11 ENCOUNTER — Ambulatory Visit: Payer: 59 | Admitting: Physical Therapy

## 2023-11-11 DIAGNOSIS — R2681 Unsteadiness on feet: Secondary | ICD-10-CM

## 2023-11-11 DIAGNOSIS — M542 Cervicalgia: Secondary | ICD-10-CM | POA: Diagnosis not present

## 2023-11-11 DIAGNOSIS — R42 Dizziness and giddiness: Secondary | ICD-10-CM | POA: Diagnosis not present

## 2023-11-17 NOTE — Therapy (Signed)
OUTPATIENT PHYSICAL THERAPY VESTIBULAR TREATMENT     Patient Name: Marvin Espinoza MRN: [id number] DOB:08-05-1971, 52 y.o., male Today's Date: 11/17/2023  END OF SESSION:           Past Medical History:  Diagnosis Date   Anxiety    Bipolar disorder (HCC)    CTS (carpal tunnel syndrome)    Depression    Diabetes mellitus without complication (HCC)    GERD (gastroesophageal reflux disease)    H/O bariatric surgery 2016   Head injury 05/06/2017   Hypothyroidism    Hypothyroidism    Sleep apnea    no longer with OSA d/t over 100 pd weight loss   Past Surgical History:  Procedure Laterality Date   ANAL FISSURE REPAIR     BARIATRIC SURGERY     CARPAL TUNNEL RELEASE Left 05/09/2015   Procedure: LEFT CARPAL TUNNEL RELEASE;  Surgeon: Cindee Salt, MD;  Location: [facility];  Service: Orthopedics;  Laterality: Left;   CARPAL TUNNEL RELEASE Left 05-09-2015   CARPAL TUNNEL RELEASE Right 06/20/2015   Procedure: RIGHT CARPAL TUNNEL RELEASE;  Surgeon: Cindee Salt, MD;  Location: [facility];  Service: Orthopedics;  Laterality: Right;  REGIONAL/FAB   CHOLECYSTECTOMY     COLONOSCOPY     UPPER GASTROINTESTINAL ENDOSCOPY     Patient Active Problem List   Diagnosis Date Noted   Concussion without loss of consciousness 10/22/2023   Burning sensation of skin 08/22/2022   Diabetes mellitus type II, controlled (HCC) 08/01/2021   Chronic constipation 12/29/2020   History of gastric surgery 03/21/2020   Overdose of benzodiazepine, intentional self-harm, initial encounter (HCC) 01/26/2020   MDD (major depressive disorder), recurrent severe, without psychosis (HCC) 01/26/2020   Drug overdose 01/25/2020   Onychomycosis 01/05/2019   Neuropathy 01/05/2019   Severe episode of recurrent major depressive disorder, without psychotic features (HCC) 10/04/2018   OSA (obstructive sleep apnea) 09/07/2018   Bilateral hip pain 10/24/2017   Shoulder pain, bilateral 10/24/2017    Arthritis 10/24/2017   Osteoarthritis of both hips 10/24/2017   Avascular necrosis of bone of hip, right (HCC) 10/24/2017   Osteoarthritis of shoulder 10/24/2017   PTSD (post-traumatic stress disorder) 08/20/2017   Tardive dyskinesia 08/20/2017   Severe major depression (HCC) 05/10/2017   Affective psychosis, bipolar (HCC) 05/10/2017   Vision disturbance 05/06/2017   History of concussion 02/10/2017   Mood disorder (HCC) 02/10/2017   Adjustment insomnia 01/24/2017   Adjustment disorder with anxious mood 01/24/2017   GAD (generalized anxiety disorder) 01/24/2017   Gastroesophageal reflux disease without esophagitis 11/19/2016   Hypothyroidism 11/19/2016   DDD (degenerative disc disease), lumbar 11/19/2016    PCP:    Raliegh Ip, DO   REFERRING PROVIDER: Andi Devon, DO  REFERRING DIAG: W09.8J1B (ICD-10-CM) - Concussion without loss of consciousness, initial encounter R42 (ICD-10-CM) - Dizziness and giddiness  THERAPY DIAG:  No diagnosis found.  ONSET DATE: 08/31/23  Rationale for Evaluation and Treatment: Rehabilitation  SUBJECTIVE:   SUBJECTIVE STATEMENT: Was blowing leaves over the weekend and did not feel well afterwards (dizziness, imbalance, dragging feet). Reports that the room started flipping again when he was lying down.   Pt accompanied by: self and significant other  PERTINENT HISTORY: Anxiety, bipolar disorder, CTS, depression, DM, head injury 2018 Per Dr. Christella Hartigan' note: "only dizziness with nystagmus due to vestibular dysfunction likely from concussion, but could be compounded by his underlying psychotropic medications"   PAIN:  Are you having pain? Yes: NPRS scale: 0/10 Pain  location: neck Pain description: sharp Aggravating factors: neck flexion, R sidebending  Relieving factors: meds, not taking any more  PRECAUTIONS: None  RED FLAGS: None   WEIGHT BEARING RESTRICTIONS: No  FALLS: Has patient fallen in last 6 months? Yes. Number of  falls only 1 causing concussion   LIVING ENVIRONMENT: Lives with: lives with their spouse Lives in: House/apartment Stairs:  2 steps to enter 2 story home but live on ground floor Has following equipment at home: Single point cane and Walker - 2 wheeled  PLOF: Independent; was working part time mowing and car detailing; currently on medical leave  PATIENT GOALS: improve dizziness   OBJECTIVE:     TODAY'S TREATMENT: 11/18/23 Activity Comments                         HOME EXERCISE PROGRAM Access Code: [email or web address] URL: [email or web address] Date: 11/06/2023 Prepared by: [facility] - Outpatient  Rehab - [facility]  Program Notes walk inside the house 5-10 minutes 2x/day  Exercises - Brandt-Daroff Vestibular Exercise  - 1 x daily - 5 x weekly - 2 sets - 3-5 reps - Romberg Stance on Foam Pad with Head Rotation  - 1 x daily - 5 x weekly - 2-3 sets - 30 sec hold - Romberg Stance with Head Nods on Foam Pad  - 1 x daily - 5 x weekly - 2-3 sets - 30 sec hold - Alternating Step Forward with Support  - 1 x daily - 5 x weekly - 2 sets - 10 reps - Supine to Long Sitting Vestibular Habituation  - 1 x daily - 5 x weekly - 2 sets - 3-5 reps - Long Sitting to Supine Vestibular Habituation  - 1 x daily - 5 x weekly - 2 sets - 3-5 reps     ------------------------------------------------------------------ Note: Objective measures below were completed at Evaluation unless otherwise noted.  DIAGNOSTIC FINDINGS: 08/31/23 head/neck CT/angio: Normal CTA of the head and neck. No acute brain injury. 2. No acute cervical spine injury. 3. Mild left-sided neural foraminal narrowing at the C3-4 and C4-5 levels.  COGNITION: Overall cognitive status: Within functional limits for tasks assessed   SENSATION: Denies N/T  POSTURE:  rounded shoulders and forward head  PALPATION: soft tissue restriction and TTP over R>L splenius, LS, suboccipitals   Cervical ROM:    Active  AROM (deg) eval  Flexion 32 *pain  Extension 33 *pain   Right lateral flexion 38   Left lateral flexion 35 *pain  Right rotation 46 *pain   Left rotation 59  (Blank rows = not tested)  GAIT: Gait pattern: narrow BOS, guarded, LOB and veering while walking  Assistive device utilized: None Level of assistance: SBA  PATIENT SURVEYS:  FOTO [phone]  VESTIBULAR ASSESSMENT:  GENERAL OBSERVATION: pt wears bifocals or progressive lenses   OCULOMOTOR EXAM: *pt blinking repeatedly throughout oculomotor testing and reports sensation of eye movement- difficult to discern/visualize if nystagmus is actually occurring   Ocular Alignment: normal  Ocular ROM: No Limitations  Spontaneous Nystagmus: absent  Gaze-Induced Nystagmus: absent  Smooth Pursuits: saccades  Saccades:  slow and poor trajectory   Convergence/Divergence: reports clear throughout   VESTIBULAR - OCULAR REFLEX:   Slow VOR: Normal reduced control of neck movement    VOR Cancellation: Corrective Saccades to L and c/o "fuzzy"  Head-Impulse Test: HIT Right: negative HIT Left: positive     VESTIBULAR TREATMENT:  DATE: 10/16/23  PATIENT EDUCATION: Education details: prognosis, POC, HEP, advised to speak to MD about possibility of discontinuation of Meclizine for max vestibular recovery Person educated: Patient and Spouse Education method: Explanation, Demonstration, Tactile cues, Verbal cues, and Handouts Education comprehension: verbalized understanding and returned demonstration  HOME EXERCISE PROGRAM: Access Code: [email or web address] URL: [email or web address] Date: 10/16/2023 Prepared by: [facility] - Outpatient  Rehab - [facility]  Program Notes walk inside the house 5-10 minutes 2x/day  Exercises - Seated Shoulder Shrug Circles AROM Backward  - 1 x daily - 5 x weekly - 2 sets - 10 reps - Seated Shoulder  Shrug Circles AROM Forward  - 1 x daily - 5 x weekly - 2 sets - 10 reps - Gentle Levator Scapulae Stretch  - 1 x daily - 5 x weekly - 2 sets - 20 sec hold - Seated Cervical Rotation AROM  - 1 x daily - 5 x weekly - 2 sets - 10 reps  GOALS: Goals reviewed with patient? Yes  SHORT TERM GOALS: Target date: 11/06/2023  Patient to be independent with initial HEP. Baseline: HEP initiated Goal status: MET    LONG TERM GOALS: Target date: 12/11/2023  Patient to be independent with advanced HEP. 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  Baseline: Unable Goal status: IN PROGRESS  Patient will report 0/10 dizziness with bed mobility and transfers.  Baseline: Symptomatic  Goal status: IN PROGRESS  Patient to score at least 23/30 on FGA in order to decrease risk of falls. Baseline: NT Goal status: IN PROGRESS  Patient to report resolution of neck pain,.  Baseline: - Goal status: IN PROGRESS  Patient to score at least 59 on FOTO in order to indicate improved functional outcomes.  Baseline: 48 Goal status: IN PROGRESS    ASSESSMENT:  CLINICAL IMPRESSION: Patient arrived to session with report of remaining dizziness and notes increased dizziness and imbalance after yardwork over the weekends. Positional testing today revealed symptomatic R and L DH. Sitting up from R [facility] revealed delayed L pulsion upon sitting up. Treated patient with universal repositioning maneuver to B sides. Retesting was less symptomatic and patient without episode of L pulsion upon sitting up. No complaints upon leaving.   OBJECTIVE IMPAIRMENTS: Abnormal gait, decreased activity tolerance, decreased balance, decreased ROM, dizziness, increased muscle spasms, impaired flexibility, postural dysfunction, and pain.   ACTIVITY LIMITATIONS: carrying, lifting, bending, sitting, standing, squatting, sleeping, stairs,  transfers, bed mobility, bathing, toileting, dressing, reach over head, hygiene/grooming, and locomotion level  PARTICIPATION LIMITATIONS: meal prep, cleaning, laundry, driving, shopping, community activity, occupation, yard work, and church  PERSONAL FACTORS: Age, Behavior pattern, Past/current experiences, Time since onset of injury/illness/exacerbation, and 3+ comorbidities: Anxiety, bipolar disorder, CTS, depression, DM, head injury 2018  are also affecting patient's functional outcome.   REHAB POTENTIAL: Good  CLINICAL DECISION MAKING: Evolving/moderate complexity  EVALUATION COMPLEXITY: Moderate   PLAN:  PT FREQUENCY: 1-2x/week  PT DURATION: 8 weeks  PLANNED INTERVENTIONS: 97164- PT Re-evaluation, 97110-Therapeutic exercises, 97530- Therapeutic activity, [id number]- Neuromuscular re-education, 97535- Self Care, 14782- Manual therapy, [id number]- Gait training, [phone]- Canalith repositioning, [id number]- Aquatic Therapy, [phone]- Electrical stimulation (manual), Patient/Family education, Balance training, Stair training, Taping, Dry Needling, Joint mobilization, Spinal mobilization, Vestibular training, Cryotherapy, and Moist heat  PLAN FOR NEXT SESSION: Repeat positional testing and treat as appropriate.  Add balance and dizziness exercises into HEP  Anette Guarneri, PT, DPT 11/17/23 10:20 AM  [facility] Health Outpatient Rehab at [facility] [address] [place], [address] [place], [place] [address] Phone # [phone] Fax # [phone]

## 2023-11-18 ENCOUNTER — Encounter: Payer: Self-pay | Admitting: Physical Therapy

## 2023-11-18 ENCOUNTER — Ambulatory Visit: Payer: 59 | Attending: Family Medicine | Admitting: Physical Therapy

## 2023-11-18 DIAGNOSIS — R2681 Unsteadiness on feet: Secondary | ICD-10-CM | POA: Diagnosis not present

## 2023-11-18 DIAGNOSIS — M542 Cervicalgia: Secondary | ICD-10-CM | POA: Diagnosis not present

## 2023-11-18 DIAGNOSIS — R42 Dizziness and giddiness: Secondary | ICD-10-CM | POA: Insufficient documentation

## 2023-11-20 ENCOUNTER — Encounter: Payer: 59 | Admitting: Physical Therapy

## 2023-11-24 NOTE — Therapy (Signed)
OUTPATIENT PHYSICAL THERAPY VESTIBULAR NOTE     Patient Name: Marvin Espinoza MRN: [id number] DOB:1971/09/18, 52 y.o., male Today's Date: 11/24/2023  END OF SESSION:            Past Medical History:  Diagnosis Date   Anxiety    Bipolar disorder (HCC)    CTS (carpal tunnel syndrome)    Depression    Diabetes mellitus without complication (HCC)    GERD (gastroesophageal reflux disease)    H/O bariatric surgery 2016   Head injury 05/06/2017   Hypothyroidism    Hypothyroidism    Sleep apnea    no longer with OSA d/t over 100 pd weight loss   Past Surgical History:  Procedure Laterality Date   ANAL FISSURE REPAIR     BARIATRIC SURGERY     CARPAL TUNNEL RELEASE Left 05/09/2015   Procedure: LEFT CARPAL TUNNEL RELEASE;  Surgeon: Cindee Salt, MD;  Location: [facility];  Service: Orthopedics;  Laterality: Left;   CARPAL TUNNEL RELEASE Left 05-09-2015   CARPAL TUNNEL RELEASE Right 06/20/2015   Procedure: RIGHT CARPAL TUNNEL RELEASE;  Surgeon: Cindee Salt, MD;  Location: [facility];  Service: Orthopedics;  Laterality: Right;  REGIONAL/FAB   CHOLECYSTECTOMY     COLONOSCOPY     UPPER GASTROINTESTINAL ENDOSCOPY     Patient Active Problem List   Diagnosis Date Noted   Concussion without loss of consciousness 10/22/2023   Burning sensation of skin 08/22/2022   Diabetes mellitus type II, controlled (HCC) 08/01/2021   Chronic constipation 12/29/2020   History of gastric surgery 03/21/2020   Overdose of benzodiazepine, intentional self-harm, initial encounter (HCC) 01/26/2020   MDD (major depressive disorder), recurrent severe, without psychosis (HCC) 01/26/2020   Drug overdose 01/25/2020   Onychomycosis 01/05/2019   Neuropathy 01/05/2019   Severe episode of recurrent major depressive disorder, without psychotic features (HCC) 10/04/2018   OSA (obstructive sleep apnea) 09/07/2018   Bilateral hip pain 10/24/2017   Shoulder pain, bilateral 10/24/2017    Arthritis 10/24/2017   Osteoarthritis of both hips 10/24/2017   Avascular necrosis of bone of hip, right (HCC) 10/24/2017   Osteoarthritis of shoulder 10/24/2017   PTSD (post-traumatic stress disorder) 08/20/2017   Tardive dyskinesia 08/20/2017   Severe major depression (HCC) 05/10/2017   Affective psychosis, bipolar (HCC) 05/10/2017   Vision disturbance 05/06/2017   History of concussion 02/10/2017   Mood disorder (HCC) 02/10/2017   Adjustment insomnia 01/24/2017   Adjustment disorder with anxious mood 01/24/2017   GAD (generalized anxiety disorder) 01/24/2017   Gastroesophageal reflux disease without esophagitis 11/19/2016   Hypothyroidism 11/19/2016   DDD (degenerative disc disease), lumbar 11/19/2016    PCP:    Raliegh Ip, DO   REFERRING PROVIDER: Andi Devon, DO  REFERRING DIAG: Y86.5H8I (ICD-10-CM) - Concussion without loss of consciousness, initial encounter R42 (ICD-10-CM) - Dizziness and giddiness  THERAPY DIAG:  No diagnosis found.  ONSET DATE: 08/31/23  Rationale for Evaluation and Treatment: Rehabilitation  SUBJECTIVE:   SUBJECTIVE STATEMENT: Reports that he is still dizziness lying down and noticing some dizziness when getting off the lawnmower. Reports that he has not had to do any weed eating but reports that he feels that he would probably be able to. Reports "not really noticed it" concerning neck pain in the past week and reports that neck pain does not prevent him in doing anything he needs to do.   Pt accompanied by: self and significant other  PERTINENT HISTORY: Anxiety, bipolar disorder, CTS, depression,  DM, head injury 2018 Per Dr. Christella Hartigan' note: "only dizziness with nystagmus due to vestibular dysfunction likely from concussion, but could be compounded by his underlying psychotropic medications"   PAIN:  Are you having pain? Yes: NPRS scale: 0/10 Pain location: neck Pain description: sharp Aggravating factors: neck flexion, R  sidebending  Relieving factors: meds, not taking any more  PRECAUTIONS: None  RED FLAGS: None   WEIGHT BEARING RESTRICTIONS: No  FALLS: Has patient fallen in last 6 months? Yes. Number of falls only 1 causing concussion   LIVING ENVIRONMENT: Lives with: lives with their spouse Lives in: House/apartment Stairs:  2 steps to enter 2 story home but live on ground floor Has following equipment at home: Single point cane and Walker - 2 wheeled  PLOF: Independent; was working part time mowing and car detailing; currently on medical leave  PATIENT GOALS: improve dizziness   OBJECTIVE:    TODAY'S TREATMENT: 11/25/23 Activity Comments                         HOME EXERCISE PROGRAM Access Code: [email or web address] URL: [email or web address] Date: 11/06/2023 Prepared by: [facility] - Outpatient  Rehab - [facility]  Program Notes walk inside the house 5-10 minutes 2x/day  Exercises - Brandt-Daroff Vestibular Exercise  - 1 x daily - 5 x weekly - 2 sets - 3-5 reps - Romberg Stance on Foam Pad with Head Rotation  - 1 x daily - 5 x weekly - 2-3 sets - 30 sec hold - Romberg Stance with Head Nods on Foam Pad  - 1 x daily - 5 x weekly - 2-3 sets - 30 sec hold - Alternating Step Forward with Support  - 1 x daily - 5 x weekly - 2 sets - 10 reps - Supine to Long Sitting Vestibular Habituation  - 1 x daily - 5 x weekly - 2 sets - 3-5 reps - Long Sitting to Supine Vestibular Habituation  - 1 x daily - 5 x weekly - 2 sets - 3-5 reps     ------------------------------------------------------------------ Note: Objective measures below were completed at Evaluation unless otherwise noted.  DIAGNOSTIC FINDINGS: 08/31/23 head/neck CT/angio: Normal CTA of the head and neck. No acute brain injury. 2. No acute cervical spine injury. 3. Mild left-sided neural foraminal narrowing at the C3-4 and C4-5 levels.  COGNITION: Overall cognitive status: Within functional limits for tasks  assessed   SENSATION: Denies N/T  POSTURE:  rounded shoulders and forward head  PALPATION: soft tissue restriction and TTP over R>L splenius, LS, suboccipitals   Cervical ROM:    Active AROM (deg) eval  Flexion 32 *pain  Extension 33 *pain   Right lateral flexion 38   Left lateral flexion 35 *pain  Right rotation 46 *pain   Left rotation 59  (Blank rows = not tested)  GAIT: Gait pattern: narrow BOS, guarded, LOB and veering while walking  Assistive device utilized: None Level of assistance: SBA  PATIENT SURVEYS:  FOTO [phone]  VESTIBULAR ASSESSMENT:  GENERAL OBSERVATION: pt wears bifocals or progressive lenses   OCULOMOTOR EXAM: *pt blinking repeatedly throughout oculomotor testing and reports sensation of eye movement- difficult to discern/visualize if nystagmus is actually occurring   Ocular Alignment: normal  Ocular ROM: No Limitations  Spontaneous Nystagmus: absent  Gaze-Induced Nystagmus: absent  Smooth Pursuits: saccades  Saccades:  slow and poor trajectory   Convergence/Divergence: reports clear throughout   VESTIBULAR - OCULAR REFLEX:   Slow VOR: Normal reduced control  of neck movement    VOR Cancellation: Corrective Saccades to L and c/o "fuzzy"  Head-Impulse Test: HIT Right: negative HIT Left: positive     VESTIBULAR TREATMENT:                                                                                                   DATE: 10/16/23  PATIENT EDUCATION: Education details: prognosis, POC, HEP, advised to speak to MD about possibility of discontinuation of Meclizine for max vestibular recovery Person educated: Patient and Spouse Education method: Explanation, Demonstration, Tactile cues, Verbal cues, and Handouts Education comprehension: verbalized understanding and returned demonstration  HOME EXERCISE PROGRAM: Access Code: [email or web address] URL: [email or web address] Date: 10/16/2023 Prepared by: [facility] - Outpatient  Rehab - [facility]  Program Notes walk inside the house 5-10 minutes 2x/day  Exercises - Seated Shoulder Shrug Circles AROM Backward  - 1 x daily - 5 x weekly - 2 sets - 10 reps - Seated Shoulder Shrug Circles AROM Forward  - 1 x daily - 5 x weekly - 2 sets - 10 reps - Gentle Levator Scapulae Stretch  - 1 x daily - 5 x weekly - 2 sets - 20 sec hold - Seated Cervical Rotation AROM  - 1 x daily - 5 x weekly - 2 sets - 10 reps  GOALS: Goals reviewed with patient? Yes  SHORT TERM GOALS: Target date: 11/06/2023  Patient to be independent with initial HEP. Baseline: HEP initiated Goal status: MET    LONG TERM GOALS: Target date: 12/11/2023  Patient to be independent with advanced HEP. 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  Baseline: Unable; reports mild remaining dizziness when getting off the lawnmower and reports that he believes that he would be able to weedeat if he needed to 11/18/23 Goal status: IN PROGRESS 11/18/23  Patient will report 0/10 dizziness with bed mobility and transfers.  Baseline: Symptomatic; reports 0/10 with STS transfers and 5-7/10 dizziness with bed mobility 11/18/23 Goal status: IN PROGRESS 11/18/23  Patient to score at least 23/30 on FGA in order to decrease risk of falls. Baseline: NT; 24/30 11/18/23 Goal status: MET 11/18/23  Patient to report resolution of neck pain,.  Baseline: Reports "not really noticed it" concerning neck pain in the past week and reports that neck pain does not prevent him in doing anything he needs to do 11/18/23 Goal status: MET 11/18/23  Patient to score at least 59 on FOTO in order to indicate improved functional outcomes.  Baseline: 48; 51.6857 11/18/23 Goal status: IN PROGRESS 11/18/23    ASSESSMENT:  CLINICAL IMPRESSION: Patient reports continued dizziness when lying down and noticing some  dizziness when getting off the lawnmower. Reports that he has not tried weed eating but feels that he would probably be able to. Able to complete bed mobility with 5-7/10 dizziness and transfers with 0/10 dizziness today. Patient reports neck pain is not limiting his function at this time. Patient scored 24/30 on FGA, indicating a decreased  risk of falls and improved since initial assessment. As patient has demonstrated great progress in all aspects besides remaining positional dizziness, will make referring MD aware if additional workup is necessary.   OBJECTIVE IMPAIRMENTS: Abnormal gait, decreased activity tolerance, decreased balance, decreased ROM, dizziness, increased muscle spasms, impaired flexibility, postural dysfunction, and pain.   ACTIVITY LIMITATIONS: carrying, lifting, bending, sitting, standing, squatting, sleeping, stairs, transfers, bed mobility, bathing, toileting, dressing, reach over head, hygiene/grooming, and locomotion level  PARTICIPATION LIMITATIONS: meal prep, cleaning, laundry, driving, shopping, community activity, occupation, yard work, and church  PERSONAL FACTORS: Age, Behavior pattern, Past/current experiences, Time since onset of injury/illness/exacerbation, and 3+ comorbidities: Anxiety, bipolar disorder, CTS, depression, DM, head injury 2018  are also affecting patient's functional outcome.   REHAB POTENTIAL: Good  CLINICAL DECISION MAKING: Evolving/moderate complexity  EVALUATION COMPLEXITY: Moderate   PLAN:  PT FREQUENCY: 1-2x/week  PT DURATION: 8 weeks  PLANNED INTERVENTIONS: 97164- PT Re-evaluation, 97110-Therapeutic exercises, 97530- Therapeutic activity, 97112- Neuromuscular re-education, 97535- Self Care, 40981- Manual therapy, [phone]- Gait training, [phone]- Canalith repositioning, [id number]- Aquatic Therapy, [phone]- Electrical stimulation (manual), Patient/Family education, Balance training, Stair training, Taping, Dry Needling, Joint mobilization, Spinal  mobilization, Vestibular training, Cryotherapy, and Moist heat  PLAN FOR NEXT SESSION: try CRM with positional dizziness and put on hold if he does not respond  Anette Guarneri, PT, DPT 11/24/23 7:25 AM  [facility] [address] [place], [place] [address] Phone # [phone] Fax # [phone]

## 2023-11-25 ENCOUNTER — Other Ambulatory Visit: Payer: Self-pay | Admitting: Family Medicine

## 2023-11-25 ENCOUNTER — Ambulatory Visit: Payer: 59 | Admitting: Physical Therapy

## 2023-11-25 ENCOUNTER — Encounter: Payer: Self-pay | Admitting: Physical Therapy

## 2023-11-25 DIAGNOSIS — R2681 Unsteadiness on feet: Secondary | ICD-10-CM | POA: Diagnosis not present

## 2023-11-25 DIAGNOSIS — R42 Dizziness and giddiness: Secondary | ICD-10-CM | POA: Diagnosis not present

## 2023-11-25 DIAGNOSIS — M542 Cervicalgia: Secondary | ICD-10-CM

## 2023-11-25 DIAGNOSIS — M51369 Other intervertebral disc degeneration, lumbar region without mention of lumbar back pain or lower extremity pain: Secondary | ICD-10-CM

## 2023-11-26 ENCOUNTER — Ambulatory Visit: Payer: Self-pay | Admitting: *Deleted

## 2023-11-26 NOTE — Patient Outreach (Signed)
  Care Coordination   11/26/2023  Name: Marvin Espinoza MRN: [id number] DOB: Sep 04, 1971   Care Coordination Outreach Attempts:  An unsuccessful telephone outreach was attempted today to offer the patient information about available care coordination services. HIPAA compliant messages left on voicemail providing contact information for CSW, encouraging patient to return CSW's call at his earliest convenience.  Follow Up Plan:  Additional outreach attempts will be made to offer the patient care coordination information and services.   Encounter Outcome:  No Answer.   Care Coordination Interventions:  No, not indicated.    Danford Bad, BSW, MSW, Printmaker Social Work Case Set designer Health  [facility], Population Health Direct Dial: [phone]  Fax: [phone] Email: [email or web address]

## 2023-11-27 ENCOUNTER — Encounter: Payer: 59 | Admitting: Physical Therapy

## 2023-11-27 ENCOUNTER — Ambulatory Visit: Payer: Self-pay | Admitting: *Deleted

## 2023-11-27 NOTE — Patient Instructions (Signed)
Visit Information  Thank you for taking time to visit with me today. Please don't hesitate to contact me if I can be of assistance to you.   Following are the goals we discussed today:   Goals Addressed               This Visit's Progress     Reduce & Manage Symptoms of Anxiety & Depression. (pt-stated)   On track     Care Coordination Interventions:   Interventions Today    Flowsheet Row Most Recent Value  Chronic Disease   Chronic disease during today's visit Diabetes, Other  [Adjustment Disorder w/Anxious Mood, Adjustment Insomnia, Bipolar Affective Psychosis, Drug Overdose, Generalized Anxiety Disorder, History of Concussion, Recurrent Severe Major Depressive Disorder, Mood Disorder, PTSD, Neuropathy, Tardive Dyskinesia.]  General Interventions   General Interventions Discussed/Reviewed General Interventions Discussed, General Interventions Reviewed, Communication with, Doctor Visits, [facility], Vaccines, [employer] Exam, Labs, Health Screening, Horticulturist, commercial (DME), Annual Foot Exam, Level of Care  [Communication with Care Team Members.]  Labs Hgb A1c every 3 months, Hgb A1c annually, Kidney Function  [Encouraged Routine Labs.]  Vaccines COVID-19, Flu, Pneumonia, RSV, Shingles, Tetanus/Pertussis/Diphtheria  [Encouraged Annual Vaccinations.]  Doctor Visits Discussed/Reviewed Doctor Visits Discussed, Doctor Visits Reviewed, Annual Wellness Visits, PCP, Specialist  [Encouraged Routine Engagement.]  Health Screening Bone Density, Colonoscopy, Prostate  [Encouraged Annual Health Screenings.]  Durable Medical Equipment (DME) Glucomoter, BP Cuff, Other  [Prescription Eyeglasses, Hand-Held Shower Hose, Engineer, materials in [place], Cane.]  PCP/Specialist Visits Compliance with follow-up visit  [Encouraged Routine Engagement.]  Communication with PCP/Specialists, Charity fundraiser, Pharmacists, Social Work  [employer] Routine Engagement.]  Level of Care Adult Daycare, Air traffic controller, Assisted  Living, Skilled Nursing Facility  [Confirmed Disinterest in Enrollment in Adult Day Care Program or Receiving Assistance Pursuing Higher Level of Care Placement Options (I.e Assisted Living Versus Skilled Nursing Facility).]  Applications Medicaid, Personal Care Services  [Confirmed Disinterest in Applying for Medicaid or Personal Care Services.]  Exercise Interventions   Exercise Discussed/Reviewed Exercise Discussed, Assistive device use and maintanence, Exercise Reviewed, Physical Activity, Weight Managment  [Encouraged Increased Level of Activity & Exercise, Inside & Outside the Home.]  Physical Activity Discussed/Reviewed Physical Activity Discussed, PREP, Gym, Physical Activity Reviewed, Types of exercise, Home Exercise Program (HEP)  [Encouraged Daily Exercise Regimen.]  Weight Management Weight loss  [Encouraged for Health Reasons.]  Education Interventions   Education Provided Provided Education  [Encouraged Review of Educational Materials Provided.]  Provided Verbal Education On When to see the doctor, Mental Health/Coping with Illness, Exercise, Medication, Nutrition, Community Resources  [Encouraged Consideration & Implementation.]  Labs Reviewed Hgb A1c  Applications Medicaid, Personal Care Services  [Confirmed Disinterest in Applying for Medicaid or Personal Care Services.]  Mental Health Interventions   Mental Health Discussed/Reviewed Mental Health Discussed, Grief and Loss, Substance Abuse, Mental Health Reviewed, Suicide, Coping Strategies, Other, Crisis, Anxiety, Depression  [Assessed Mental Health & Cognitive Status.]  Nutrition Interventions   Nutrition Discussed/Reviewed Nutrition Discussed, Fluid intake, Decreasing salt, Portion sizes, Nutrition Reviewed, Decreasing sugar intake, Carbohydrate meal planning, Increasing proteins, Adding fruits and vegetables, Decreasing fats  [Encouraged Heart-Healthy, Low-Sodium, Low Fat, Fiber-Rich, Diabetic-Friendly Diet.]  Pharmacy  Interventions   Pharmacy Dicussed/Reviewed Pharmacy Topics Discussed, Medication Adherence, Affording Medications, Pharmacy Topics Reviewed, Medications and their functions  [Confirmed Ability to Afford Prescription Medications.]  Medication Adherence --  [Confirmed Compliance with Prescription Medications.]  Referral to Pharmacist Drug interaction/side effects  [Drug Interactions/Side Effects to All Psychotropic Medications.]  Safety Interventions   Safety Discussed/Reviewed  Safety Discussed, Safety Reviewed  [Encouraged Routine Use of Home Safety Devices.]  Home Safety Assistive Devices, Need for home safety assessment  [Encouraged Consideration of Home Safety Evaluation.]  Advanced Directive Interventions   Advanced Directives Discussed/Reviewed Advanced Directives Discussed, Advanced Directives Reviewed  Retail banker (Living Will & Healthcare Power of Attorney Documents) Initiated & Copies Requested to Scan into Electronic Medical Record in Epic.]      Active Listening & Reflection Utilized.  Verbalization of Feelings Encouraged.  Emotional Support Provided.   Acceptance & Commitment Therapy Performed. Cognitive Behavioral Therapy Initiated. Client-Centered Therapy Implemented.  Encouraged Routine Engagement with Dr. Milagros Evener, Psychiatrist with [facility] [phone]), to Receive Psychotropic Medication Administration & Management, in An Effort to Reduce & Manage Symptoms of Anxiety, Depression & Panic Attacks. Encouraged Routine Engagement with Brain Lynnell Chad, Licensed Clinical Social Worker with [facility] [phone]), to Receive Psychotherapeutic Counseling & Supportive Services, in An Effort to Reduce & Manage Symptoms of Anxiety, Depression & Panic Attacks.  Encouraged Routine Engagement with Danford Bad, Licensed Clinical Social Worker with [facility] [phone]), if You Have Questions,  Need Assistance, or If Additional Social Work Needs Are Identified Between Now & Our Next Follow-Up Outreach Call, Scheduled on 12/26/2023 at 9:00 AM.      Our next appointment is by telephone on 12/26/2023 at 9:00 am.  Please call the care guide team at [phone] if you need to cancel or reschedule your appointment.   If you are experiencing a Mental Health or Behavioral Health Crisis or need someone to talk to, please call the Suicide and Crisis Lifeline: 988 call the [place] National Suicide Prevention Lifeline: 7408638139 or TTY: (225)046-8564 TTY 715 509 3582) to talk to a trained counselor call 1-800-273-TALK (toll free, 24 hour hotline) go to [facility] [address] [place] [phone]) call the [facility] Crisis Line: [phone] call 911  Patient verbalizes understanding of instructions and care plan provided today and agrees to view in MyChart. Active MyChart status and patient understanding of how to access instructions and care plan via MyChart confirmed with patient.     Telephone follow up appointment with care management team member scheduled for:  12/26/2023 at 9:00 am.  Danford Bad, BSW, MSW, LCSW  Embedded Practice Social Work Case Manager  [facility], Population Health Direct Dial: [phone]  Fax: [phone] Email: [email or web address]: [email or web address]

## 2023-11-27 NOTE — Patient Outreach (Signed)
Care Coordination   Follow Up Visit Note   11/27/2023  Name: Ethanalexander Widmark MRN: [id number] DOB: [birth year]  Meco Schlafer is a 52 y.o. year old male who sees Raliegh Ip, DO for primary care. I spoke with [name] by phone today.  What matters to the patients health and wellness today?  Reduce & Manage Symptoms of Anxiety & Depression.     Goals Addressed               This Visit's Progress     Reduce & Manage Symptoms of Anxiety & Depression. (pt-stated)   On track     Care Coordination Interventions:   Interventions Today    Flowsheet Row Most Recent Value  Chronic Disease   Chronic disease during today's visit Diabetes, Other  [Adjustment Disorder w/Anxious Mood, Adjustment Insomnia, Bipolar Affective Psychosis, Drug Overdose, Generalized Anxiety Disorder, History of Concussion, Recurrent Severe Major Depressive Disorder, Mood Disorder, PTSD, Neuropathy, Tardive Dyskinesia.]  General Interventions   General Interventions Discussed/Reviewed General Interventions Discussed, General Interventions Reviewed, Communication with, Doctor Visits, [facility], Vaccines, [employer] Exam, Labs, Health Screening, Horticulturist, commercial (DME), Annual Foot Exam, Level of Care  [Communication with Care Team Members.]  Labs Hgb A1c every 3 months, Hgb A1c annually, Kidney Function  [Encouraged Routine Labs.]  Vaccines COVID-19, Flu, Pneumonia, RSV, Shingles, Tetanus/Pertussis/Diphtheria  [Encouraged Annual Vaccinations.]  Doctor Visits Discussed/Reviewed Doctor Visits Discussed, Doctor Visits Reviewed, Annual Wellness Visits, PCP, Specialist  [Encouraged Routine Engagement.]  Health Screening Bone Density, Colonoscopy, Prostate  [Encouraged Annual Health Screenings.]  Durable Medical Equipment (DME) Glucomoter, BP Cuff, Other  [Prescription Eyeglasses, Hand-Held Shower Hose, Engineer, materials in [place], Cane.]  PCP/Specialist Visits Compliance with follow-up visit   [Encouraged Routine Engagement.]  Communication with PCP/Specialists, Charity fundraiser, Pharmacists, Social Work  [employer] Routine Engagement.]  Level of Care Adult Daycare, Air traffic controller, Assisted Living, Skilled Nursing Facility  [Confirmed Disinterest in Enrollment in Adult Day Care Program or Receiving Assistance Pursuing Higher Level of Care Placement Options (I.e Assisted Living Versus Skilled Nursing Facility).]  Applications Medicaid, Personal Care Services  [Confirmed Disinterest in Applying for Medicaid or Personal Care Services.]  Exercise Interventions   Exercise Discussed/Reviewed Exercise Discussed, Assistive device use and maintanence, Exercise Reviewed, Physical Activity, Weight Managment  [Encouraged Increased Level of Activity & Exercise, Inside & Outside the Home.]  Physical Activity Discussed/Reviewed Physical Activity Discussed, PREP, Gym, Physical Activity Reviewed, Types of exercise, Home Exercise Program (HEP)  [Encouraged Daily Exercise Regimen.]  Weight Management Weight loss  [Encouraged for Health Reasons.]  Education Interventions   Education Provided Provided Education  [Encouraged Review of Educational Materials Provided.]  Provided Verbal Education On When to see the doctor, Mental Health/Coping with Illness, Exercise, Medication, Nutrition, Community Resources  [Encouraged Consideration & Implementation.]  Labs Reviewed Hgb A1c  Applications Medicaid, Personal Care Services  [Confirmed Disinterest in Applying for Medicaid or Personal Care Services.]  Mental Health Interventions   Mental Health Discussed/Reviewed Mental Health Discussed, Grief and Loss, Substance Abuse, Mental Health Reviewed, Suicide, Coping Strategies, Other, Crisis, Anxiety, Depression  [Assessed Mental Health & Cognitive Status.]  Nutrition Interventions   Nutrition Discussed/Reviewed Nutrition Discussed, Fluid intake, Decreasing salt, Portion sizes, Nutrition Reviewed, Decreasing sugar intake, Carbohydrate  meal planning, Increasing proteins, Adding fruits and vegetables, Decreasing fats  [Encouraged Heart-Healthy, Low-Sodium, Low Fat, Fiber-Rich, Diabetic-Friendly Diet.]  Pharmacy Interventions   Pharmacy Dicussed/Reviewed Pharmacy Topics Discussed, Medication Adherence, Affording Medications, Pharmacy Topics Reviewed, Medications and their functions  [Confirmed Ability to Afford Prescription Medications.]  Medication Adherence --  [Confirmed Compliance with Prescription Medications.]  Referral to Pharmacist Drug interaction/side effects  [Drug Interactions/Side Effects to All Psychotropic Medications.]  Safety Interventions   Safety Discussed/Reviewed Safety Discussed, Safety Reviewed  [Encouraged Routine Use of Home Safety Devices.]  Home Safety Assistive Devices, Need for home safety assessment  [Encouraged Consideration of Home Safety Evaluation.]  Advanced Directive Interventions   Advanced Directives Discussed/Reviewed Advanced Directives Discussed, Advanced Directives Reviewed  Retail banker (Living Will & Healthcare Power of Attorney Documents) Initiated & Copies Requested to Scan into Electronic Medical Record in Epic.]      Active Listening & Reflection Utilized.  Verbalization of Feelings Encouraged.  Emotional Support Provided.   Acceptance & Commitment Therapy Performed. Cognitive Behavioral Therapy Initiated. Client-Centered Therapy Implemented.  Encouraged Routine Engagement with Dr. Milagros Evener, Psychiatrist with [facility] [phone]), to Receive Psychotropic Medication Administration & Management, in An Effort to Reduce & Manage Symptoms of Anxiety, Depression & Panic Attacks. Encouraged Routine Engagement with Brain Lynnell Chad, Licensed Clinical Social Worker with [facility] [phone]), to Receive Psychotherapeutic Counseling & Supportive Services, in An Effort to Reduce & Manage Symptoms of Anxiety, Depression  & Panic Attacks.  Encouraged Routine Engagement with Danford Bad, Licensed Clinical Social Worker with [facility] [phone]), if You Have Questions, Need Assistance, or If Additional Social Work Needs Are Identified Between Now & Our Next Follow-Up Outreach Call, Scheduled on 12/26/2023 at 9:00 AM.      SDOH assessments and interventions completed:  Yes.  Care Coordination Interventions:  Yes, provided.   Follow up plan: Follow up call scheduled for 12/26/2023 at 9:00 am.  Encounter Outcome:  Patient Visit Completed.   Danford Bad, BSW, MSW, Printmaker Social Work Case Set designer Health  [facility], Population Health Direct Dial: [phone]  Fax: [phone] Email: [email or web address]

## 2023-11-28 DIAGNOSIS — F4312 Post-traumatic stress disorder, chronic: Secondary | ICD-10-CM | POA: Diagnosis not present

## 2023-12-02 ENCOUNTER — Encounter: Payer: 59 | Admitting: Physical Therapy

## 2023-12-02 DIAGNOSIS — F3174 Bipolar disorder, in full remission, most recent episode manic: Secondary | ICD-10-CM | POA: Diagnosis not present

## 2023-12-02 DIAGNOSIS — F410 Panic disorder [episodic paroxysmal anxiety] without agoraphobia: Secondary | ICD-10-CM | POA: Diagnosis not present

## 2023-12-02 DIAGNOSIS — F3175 Bipolar disorder, in partial remission, most recent episode depressed: Secondary | ICD-10-CM | POA: Diagnosis not present

## 2023-12-02 NOTE — Progress Notes (Unsigned)
Subjective:   I, Marvin Riser, PhD, LAT, ATC acting as a scribe for Marvin Graham, MD.  Chief Complaint: Marvin Espinoza,  is a 52 y.o. male who presents for f/u concussion. Pt slipped on some water in a [employer] bathroom, hitting his head on the floor. Pt was last seen by Dr. Denyse Amass on 11/05/23 and was advised to contact his psychiatrist to discuss other rx options. He was also advised to keep track of his blood sugar as he may need less insulin now that he is off of Zyprexa. He also cont'd vestibular therapy, completing 10 visits.   Today, pt reports cont'd dizziness. He is no longer taking the Zyprexa nor the fluoxetine. He notes really struggling w/ depression and is feeling very low today.  He does feel safe at home and does not have any suicidal or homicidal ideation.  He does have firearms which are locked up.  He has a good safety support at home with his family.  Dx imaging: 08/31/23 Head, C-spine, L-spine, & head/neck angio CT and R elbow XR   Injury date: 08/31/23 Visit #: 3  History of Present Illness:   Concussion Self-Reported Symptom Score Symptoms rated on a scale 1-6, in last 24 hours  Headache: 0   Pressure in head: 2 Neck pain: 0 Nausea or vomiting: 0 Dizziness: 4  Blurred vision: 2  Balance problems: 4 Sensitivity to light:  0 Sensitivity to noise: 0 Feeling slowed down: 5 Feeling like "in a fog": 2 "Don't feel right": 5 Difficulty concentrating: 5 Difficulty remembering: 5 Fatigue or low energy: 6 Confusion: 5 Drowsiness: 5 More emotional: 5 Irritability: 5 Sadness: 5 Nervous or anxious: 3 Trouble falling asleep: 0   Total # of Symptoms: 16/22 Total Symptom Score: 68/132  Previous Total # of Symptoms: 12/22 Previous Symptom Score: 46/132  Tinnitus: No  Review of Systems: No fevers or chills.  Positive for depression and anxiety symptoms.    Review of History: Difficult to control bipolar disorder.  Trouble tolerating antipsychotics causing  tardive dyskinesia or allergies.  Zyprexa works well for his mood but causes worsening diabetes.  He has diabetes which currently is managed with Trulicity, Tresiba, metformin.  Previously on [employer].  He tolerated Steglatro pretty well. Victoza caused bad reflux and heartburn.  Objective:    Physical Examination Vitals:   12/03/23 0755  BP: 122/84  Pulse: 69  SpO2: 98%    Neuro: Normal coordination.  No tardive dyskinesia appearance today. Psych: Flat affect no SI or HI expressed.  Normal speech and thought process.    Assessment and Plan   52 y.o. male with concussion complicated by bipolar disorder.  His worsening symptoms are due to his worsening depression.  He stopped his Zyprexa and Prozac as it was worsening his diabetes and his mood has significantly worsened.  Unfortunately he does not have any other options to control mood besides restarting Prozac and Zyprexa at this time.  In my opinion this is essential as his concussion symptoms certainly are going to get any better if his mood is not doing well.  We talked about safety and what to do if he is getting into trouble.  Recheck back with me in about 6 weeks.  Diabetes.  His diabetes has been significantly worsened by Zyprexa.  He is gena be restarting Zyprexa now and should maximize his diabetes regimen.  He is metformin and is currently taking a moderate dose of GLP-1 which is Trulicity.  That could be increased in the  future if needed.  He does have a prescription for an SGLT2 which he should restart.  He already is taking metformin.  And he has a long-acting insulin which she can titrate.  We can estimate that he is getting need to be on all of these medicines with worsening diabetes back on Zyprexa.  He has a follow-up appoint with his PCP on January 24.  I would like to see him after that.     Recheck 6 weeks    Action/Discussion: Reviewed diagnosis, management options, expected outcomes, and the reasons for  scheduled and emergent follow-up. Questions were adequately answered. Patient expressed verbal understanding and agreement with the following plan.     Patient Education: Reviewed with patient the risks (i.e, a repeat concussion, post-concussion syndrome, second-impact syndrome) of returning to play prior to complete resolution, and thoroughly reviewed the signs and symptoms of concussion.Reviewed need for complete resolution of all symptoms, with rest AND exertion, prior to return to play. Reviewed red flags for urgent medical evaluation: worsening symptoms, nausea/vomiting, intractable headache, musculoskeletal changes, focal neurological deficits. [facility] Concussion Care Plan, which clearly outlines the plans stated above, was given to patient.   Level of service: Total encounter time 30 minutes including face-to-face time with the patient and, reviewing past medical record, and charting on the date of service.        After Visit Summary printed out and provided to patient as appropriate.  The above documentation has been reviewed and is accurate and complete Marvin Espinoza

## 2023-12-03 ENCOUNTER — Ambulatory Visit: Payer: 59 | Admitting: Family Medicine

## 2023-12-03 VITALS — BP 122/84 | HR 69 | Ht 70.0 in | Wt 271.0 lb

## 2023-12-03 DIAGNOSIS — F315 Bipolar disorder, current episode depressed, severe, with psychotic features: Secondary | ICD-10-CM | POA: Diagnosis not present

## 2023-12-03 DIAGNOSIS — T424X2A Poisoning by benzodiazepines, intentional self-harm, initial encounter: Secondary | ICD-10-CM

## 2023-12-03 DIAGNOSIS — Z7984 Long term (current) use of oral hypoglycemic drugs: Secondary | ICD-10-CM | POA: Diagnosis not present

## 2023-12-03 DIAGNOSIS — Z7985 Long-term (current) use of injectable non-insulin antidiabetic drugs: Secondary | ICD-10-CM

## 2023-12-03 DIAGNOSIS — S060X0D Concussion without loss of consciousness, subsequent encounter: Secondary | ICD-10-CM | POA: Diagnosis not present

## 2023-12-03 DIAGNOSIS — E1149 Type 2 diabetes mellitus with other diabetic neurological complication: Secondary | ICD-10-CM | POA: Diagnosis not present

## 2023-12-03 NOTE — Patient Instructions (Addendum)
Thank you for coming in today.   Retart zyprexa and prozac.   For Diabetes. Continue trulicity. That could be increased once you tolerate it.   Continue steglarteo.   Continue tresiba insulin.   Your blood sugar will go up with Zyprexa. Next step is to increase trulicity and tresiba insulin while continuing steglatro and metformin.     Recheck after your visit with PCP in [place].   Let me know if you need me to prescribe something.

## 2023-12-04 ENCOUNTER — Encounter: Payer: 59 | Admitting: Physical Therapy

## 2023-12-05 DIAGNOSIS — F4312 Post-traumatic stress disorder, chronic: Secondary | ICD-10-CM | POA: Diagnosis not present

## 2023-12-09 ENCOUNTER — Encounter: Payer: 59 | Admitting: Physical Therapy

## 2023-12-11 ENCOUNTER — Other Ambulatory Visit: Payer: Self-pay | Admitting: Family Medicine

## 2023-12-12 ENCOUNTER — Encounter: Payer: 59 | Admitting: Physical Therapy

## 2023-12-12 NOTE — Telephone Encounter (Signed)
Pt aware of recommendations, he will not be picking up this refill. He still has some on hand.

## 2023-12-12 NOTE — Telephone Encounter (Signed)
Please reinforce SPARING use of this medication. He can develop tardive dyskenesia with long term use of promethazine (abnormal movement disorder like parkinson's disease that is IRREVERSIBLE) .

## 2023-12-19 DIAGNOSIS — F4312 Post-traumatic stress disorder, chronic: Secondary | ICD-10-CM | POA: Diagnosis not present

## 2023-12-25 ENCOUNTER — Telehealth: Payer: 59 | Admitting: Family Medicine

## 2023-12-25 ENCOUNTER — Other Ambulatory Visit: Payer: Self-pay | Admitting: Family Medicine

## 2023-12-25 ENCOUNTER — Encounter: Payer: Self-pay | Admitting: *Deleted

## 2023-12-25 ENCOUNTER — Encounter: Payer: Self-pay | Admitting: Family Medicine

## 2023-12-25 ENCOUNTER — Ambulatory Visit: Payer: Self-pay | Admitting: *Deleted

## 2023-12-25 DIAGNOSIS — R051 Acute cough: Secondary | ICD-10-CM

## 2023-12-25 DIAGNOSIS — R0989 Other specified symptoms and signs involving the circulatory and respiratory systems: Secondary | ICD-10-CM | POA: Diagnosis not present

## 2023-12-25 DIAGNOSIS — E1169 Type 2 diabetes mellitus with other specified complication: Secondary | ICD-10-CM

## 2023-12-25 MED ORDER — AMOXICILLIN-POT CLAVULANATE 875-125 MG PO TABS
1.0000 | ORAL_TABLET | Freq: Two times a day (BID) | ORAL | 0 refills | Status: AC
Start: 2023-12-25 — End: 2024-01-01

## 2023-12-25 NOTE — Patient Outreach (Signed)
 Care Coordination   Follow Up Visit Note   12/25/2023  Name: Bellamy Judson MRN: [id number] DOB: 1971-07-31  Argel Pablo is a 53 y.o. year old male who sees Jolinda Norene HERO, DO for primary care. I spoke with Reyes Lager by phone today.  What matters to the patients health and wellness today?  Reduce & Manage Symptoms of Anxiety & Depression.    Goals Addressed               This Visit's Progress     Reduce & Manage Symptoms of Anxiety & Depression. (pt-stated)   On track     Care Coordination Interventions:  Interventions Today    Flowsheet Row Most Recent Value  Chronic Disease   Chronic disease during today's visit Diabetes, Other  [Adjustment Disorder w/ Anxious Mood, Adjustment Insomnia, Bipolar Affective Psychosis, Drug Overdose, Generalized Anxiety Disorder, History of Concussion, Recurrent Severe Major Depressive Disorder, Mood Disorder, PTSD, Neuropathy, Tardive Dyskinesia.]  General Interventions   General Interventions Discussed/Reviewed General Interventions Discussed, Labs, Vaccines, Doctor Visits, Health Screening, Annual Foot Exam, General Interventions Reviewed, Lipid Profile, Annual Eye Exam, Durable Medical Equipment (DME), Community Resources, Level of Care, Communication with  [Encouraged Routine Engagement with Care Team Members & Providers.]  Labs Hgb A1c every 3 months, Kidney Function, Hgb A1c annually  [Reviewed & Encouraged Daily Monitoring & Logging Results.]  Vaccines COVID-19, Flu, Pneumonia, RSV, Shingles, Tetanus/Pertussis/Diphtheria  [Encouraged Routine Vaccinations.]  Doctor Visits Discussed/Reviewed Doctor Visits Discussed, Specialist, Annual Wellness Visits, Doctor Visits Reviewed, PCP  [Encouraged Routine Engagement with Care Team Members & Providers.]  Health Screening Bone Density, Colonoscopy, Prostate  [Encouraged Routine Health Screenings.]  Durable Medical Equipment (DME) Glucomoter, BP Cuff, Other  [Prescription Eyeglasses, Hand-Held  Shower Hose, Engineer, Materials in [place], Cane.]  PCP/Specialist Visits Compliance with follow-up visit  [Encouraged Routine Engagement with Care Team Members & Providers.]  Communication with PCP/Specialists, CHARITY FUNDRAISER, Pharmacists, Social Work  [employer] Routine Engagement with Care Team Members & Providers.]  Level of Care Adult Daycare, Air Traffic Controller, Assisted Living, Skilled Nursing Facility  [Confirmed Disinterest in Enrollment in Adult Day Care Program or Receiving Assistance Pursuing Higher Level of Care Placement Options (I.e Assisted Living Versus Skilled Nursing Facility).]  Applications Medicaid, Personal Care Services  [Confirmed Disinterest in Applying for Medicaid or Personal Care Services.]  Exercise Interventions   Exercise Discussed/Reviewed Exercise Discussed, Assistive device use and maintanence, Exercise Reviewed, Physical Activity, Weight Managment  [Encouraged Daily Exercise Regimen, As Tolerated.]  Physical Activity Discussed/Reviewed Physical Activity Discussed, Home Exercise Program (HEP), Physical Activity Reviewed, PREP, Gym, Types of exercise  [Encouraged Increased Engagement in Activities of Interest, Inside & Outside the Home.]  Weight Management Weight loss  [Encouraged Healthy Weight Loss Regimen.]  Education Interventions   Education Provided Provided Therapist, Sports, Provided Education, Provided Web-based Education  [employer] Reviewed Educational Material to [employer] & Entertain Questions.]  Provided Verbal Education On Nutrition, Mental Health/Coping with Illness, When to see the doctor, Foot Care, Eye Care, Blood Sugar Monitoring, Labs, Applications, Exercise, Medication, [facility], [employer], Sick Day Rules  [Encouraged Continued Independent Review of Educational Material Provided.]  Labs Reviewed Hgb A1c  [Reviewed.]  Ship Broker, Personal Care Services  [Confirmed Disinterest in Applying for [employer] or Personal Care Services.]  Mental  Health Interventions   Mental Health Discussed/Reviewed Mental Health Discussed, Anxiety, Mental Health Reviewed, Depression, Grief and Loss, Coping Strategies, Crisis, Other, Suicide, Substance Abuse  [Assessed Mental Health & Cognitive Status.]  Nutrition Interventions   Nutrition Discussed/Reviewed Nutrition  Discussed, Adding fruits and vegetables, Fluid intake, Increasing proteins, Decreasing fats, Nutrition Reviewed, Carbohydrate meal planning, Portion sizes, Decreasing salt, Decreasing sugar intake  [Encouraged Diabetic-Friendly, Reduced Fat, Low Sodium Diet.]  Pharmacy Interventions   Pharmacy Dicussed/Reviewed Pharmacy Topics Discussed, Medications and their functions, Pharmacy Topics Reviewed, Medication Adherence, Affording Medications  [Confirmed Ability to Afford Prescription Medications.]  Medication Adherence --  [Confirmed Compliance with Prescription Medications.]  Safety Interventions   Safety Discussed/Reviewed Safety Discussed, Safety Reviewed  [Encouraged Routine Use of Assistive Devices & Durable Medical Equipment.]  Home Safety Assistive Devices, Need for home safety assessment  [Encouraged Consideration of Home Safety Evaluation.]  Advanced Directive Interventions   Advanced Directives Discussed/Reviewed Advanced Directives Discussed, Advanced Directives Reviewed  Engineer, Building Services (Living Will & Healthcare Power of Attorney Documents) Initiated. Copies Requested to Scan into Electronic Medical Record in Epic.]      Active Listening & Reflection Utilized.  Verbalization of Feelings Encouraged.  Emotional Support Provided.   Acceptance & Commitment Therapy Performed. Cognitive Behavioral Therapy Initiated. Client-Centered Therapy Implemented.  Encouraged Routine Engagement with Lacinda Crete, Licensed Clinical Social Worker with [employer] [facility] [phone]), to Receive Psychotherapeutic Counseling & Supportive Services, in An Effort to  Reduce & Manage Symptoms of Anxiety, Depression & Panic Attacks, & During Follow-Up Appointment, Scheduled on 12/26/2023 at 2:00 PM.  Encouraged Routine Engagement with Dr. Norene Fielding, Primary Care Provider with [facility] [phone]), to Receive Ongoing Medical Management & Treatment, in An Effort to Reduce & Manage Symptoms of Anxiety, Depression & Panic Attacks, & During Follow-Up Appointment, Scheduled on 01/09/2024 at 10:45 AM. Encouraged Routine Engagement with Krissia Schreier, Licensed Clinical Social Worker with [facility] [phone]), if You Have Questions, Need Assistance, or If Additional Social Work Needs Are Identified Between Now & Our Next Follow-Up Outreach Call, Scheduled on 01/16/2024 at 10:30 AM. Encouraged Routine Engagement with Dr. Artist Lloyd, Sports Medicine Physician with [facility] [phone]# [phone]), to Receive Rehabilitative Services for Concussion, in An Effort to Reduce & Manage Symptoms of Anxiety, Depression & Panic Attacks, & During Follow-Up Appointment, Scheduled on 01/21/2024 at 8:00 AM. Encouraged Routine Engagement with Dr. Emilio Aurora, Psychiatrist with [facility] [phone]), to Receive Psychotropic Medication Administration & Management, in An Effort to Reduce & Manage Symptoms of Anxiety, Depression & Panic Attacks, & During Follow-Up Appointment, Scheduled on 02/14/2024 at 3:00 PM.        SDOH assessments and interventions completed:  Yes.  SDOH Interventions Today    Flowsheet Row Most Recent Value  SDOH Interventions   Food Insecurity Interventions Intervention Not Indicated  Housing Interventions Intervention Not Indicated  Transportation Interventions Intervention Not Indicated, Converted Appointment to Virtual Visit, Patient Resources (Friends/Family)  Utilities Interventions Intervention Not Indicated  Alcohol Usage  Interventions Intervention Not Indicated (Score <7)  Depression Interventions/Treatment  Counseling, Medication, Currently on Treatment, Air Traffic Controller Interventions [facility] Provided, Artist  Physical Activity Interventions Intervention Not Indicated, Programmer, Applications Provided, Theatre Stage Manager for Individual Counseling  Stress Interventions [facility] Provided, [employer], Provide Counseling, Other (Comment)  [Currently Receiving Specialized Treatment Plan.]  Social Connections Interventions Intervention Not Indicated  Health Literacy Interventions Intervention Not Indicated     Care Coordination Interventions:  Yes, provided.   Follow up plan: Follow up call scheduled for 01/16/2024 at 10:30 am.  Encounter Outcome:  Patient Visit Completed.   Philippe Desanctis, BSW, MSW,  LCSW  Embedded Practice Social Work Case Set Designer Health  [facility], Population Health Direct Dial: [phone]  Fax: [phone] Email: [email or web address] Website: [email or web address]

## 2023-12-25 NOTE — Patient Instructions (Signed)
 Visit Information  Thank you for taking time to visit with me today. Please don't hesitate to contact me if I can be of assistance to you.   Following are the goals we discussed today:   Goals Addressed               This Visit's Progress     Reduce & Manage Symptoms of Anxiety & Depression. (pt-stated)   On track     Care Coordination Interventions:  Interventions Today    Flowsheet Row Most Recent Value  Chronic Disease   Chronic disease during today's visit Diabetes, Other  [Adjustment Disorder w/ Anxious Mood, Adjustment Insomnia, Bipolar Affective Psychosis, Drug Overdose, Generalized Anxiety Disorder, History of Concussion, Recurrent Severe Major Depressive Disorder, Mood Disorder, PTSD, Neuropathy, Tardive Dyskinesia.]  General Interventions   General Interventions Discussed/Reviewed General Interventions Discussed, Labs, Vaccines, Doctor Visits, Health Screening, Annual Foot Exam, General Interventions Reviewed, Lipid Profile, Annual Eye Exam, Durable Medical Equipment (DME), Community Resources, Level of Care, Communication with  [Encouraged Routine Engagement with Care Team Members & Providers.]  Labs Hgb A1c every 3 months, Kidney Function, Hgb A1c annually  [Reviewed & Encouraged Daily Monitoring & Logging Results.]  Vaccines COVID-19, Flu, Pneumonia, RSV, Shingles, Tetanus/Pertussis/Diphtheria  [Encouraged Routine Vaccinations.]  Doctor Visits Discussed/Reviewed Doctor Visits Discussed, Specialist, Annual Wellness Visits, Doctor Visits Reviewed, PCP  [Encouraged Routine Engagement with Care Team Members & Providers.]  Health Screening Bone Density, Colonoscopy, Prostate  [Encouraged Routine Health Screenings.]  Durable Medical Equipment (DME) Glucomoter, BP Cuff, Other  [Prescription Eyeglasses, Hand-Held Shower Hose, Engineer, Materials in [place], Cane.]  PCP/Specialist Visits Compliance with follow-up visit  [Encouraged Routine Engagement with Care Team Members & Providers.]   Communication with PCP/Specialists, CHARITY FUNDRAISER, Pharmacists, Social Work  [employer] Routine Engagement with Care Team Members & Providers.]  Level of Care Adult Daycare, Air Traffic Controller, Assisted Living, Skilled Nursing Facility  [Confirmed Disinterest in Enrollment in Adult Day Care Program or Receiving Assistance Pursuing Higher Level of Care Placement Options (I.e Assisted Living Versus Skilled Nursing Facility).]  Applications Medicaid, Personal Care Services  [Confirmed Disinterest in Applying for Medicaid or Personal Care Services.]  Exercise Interventions   Exercise Discussed/Reviewed Exercise Discussed, Assistive device use and maintanence, Exercise Reviewed, Physical Activity, Weight Managment  [Encouraged Daily Exercise Regimen, As Tolerated.]  Physical Activity Discussed/Reviewed Physical Activity Discussed, Home Exercise Program (HEP), Physical Activity Reviewed, PREP, Gym, Types of exercise  [Encouraged Increased Engagement in Activities of Interest, Inside & Outside the Home.]  Weight Management Weight loss  [Encouraged Healthy Weight Loss Regimen.]  Education Interventions   Education Provided Provided Therapist, Sports, Provided Education, Provided Web-based Education  [employer] Reviewed Educational Material to [employer] & Entertain Questions.]  Provided Verbal Education On Nutrition, Mental Health/Coping with Illness, When to see the doctor, Foot Care, Eye Care, Blood Sugar Monitoring, Labs, Applications, Exercise, Medication, [employer], Development Worker, Community, Sick Day Rules  [Encouraged Continued Independent Review of Educational Material Provided.]  Labs Reviewed Hgb A1c  [Reviewed.]  Ship Broker, Personal Care Services  [Confirmed Disinterest in Applying for [employer] or Personal Care Services.]  Mental Health Interventions   Mental Health Discussed/Reviewed Mental Health Discussed, Anxiety, Mental Health Reviewed, Depression, Grief and Loss, Coping Strategies,  Crisis, Other, Suicide, Substance Abuse  [Assessed Mental Health & Cognitive Status.]  Nutrition Interventions   Nutrition Discussed/Reviewed Nutrition Discussed, Adding fruits and vegetables, Fluid intake, Increasing proteins, Decreasing fats, Nutrition Reviewed, Carbohydrate meal planning, Portion sizes, Decreasing salt, Decreasing sugar intake  [Encouraged Diabetic-Friendly, Reduced Fat, Low Sodium  Diet.]  Pharmacy Interventions   Pharmacy Dicussed/Reviewed Pharmacy Topics Discussed, Medications and their functions, Pharmacy Topics Reviewed, Medication Adherence, Affording Medications  [Confirmed Ability to Afford Prescription Medications.]  Medication Adherence --  [Confirmed Compliance with Prescription Medications.]  Safety Interventions   Safety Discussed/Reviewed Safety Discussed, Safety Reviewed  [Encouraged Routine Use of Assistive Devices & Durable Medical Equipment.]  Home Safety Assistive Devices, Need for home safety assessment  [Encouraged Consideration of Home Safety Evaluation.]  Advanced Directive Interventions   Advanced Directives Discussed/Reviewed Advanced Directives Discussed, Advanced Directives Reviewed  [Confirmed Advanced Directives (Living Will & Healthcare Power of Attorney Documents) Initiated. Copies Requested to Scan into Electronic Medical Record in Epic.]      Active Listening & Reflection Utilized.  Verbalization of Feelings Encouraged.  Emotional Support Provided.   Acceptance & Commitment Therapy Performed. Cognitive Behavioral Therapy Initiated. Client-Centered Therapy Implemented.  Encouraged Routine Engagement with Lacinda Crete, Licensed Clinical Social Worker with [employer] [facility] [phone]), to Receive Psychotherapeutic Counseling & Supportive Services, in An Effort to Reduce & Manage Symptoms of Anxiety, Depression & Panic Attacks, & During Follow-Up Appointment, Scheduled on 12/26/2023 at 2:00 PM.  Encouraged Routine Engagement  with Dr. Norene Fielding, Primary Care Provider with [facility] [phone]), to Receive Ongoing Medical Management & Treatment, in An Effort to Reduce & Manage Symptoms of Anxiety, Depression & Panic Attacks, & During Follow-Up Appointment, Scheduled on 01/09/2024 at 10:45 AM. Encouraged Routine Engagement with Kameran Lallier, Licensed Clinical Social Worker with [facility] [phone]), if You Have Questions, Need Assistance, or If Additional Social Work Needs Are Identified Between Now & Our Next Follow-Up Outreach Call, Scheduled on 01/16/2024 at 10:30 AM. Encouraged Routine Engagement with Dr. Artist Lloyd, Sports Medicine Physician with [facility] at [facility] [phone]# [phone]), to Receive Rehabilitative Services for Concussion, in An Effort to Reduce & Manage Symptoms of Anxiety, Depression & Panic Attacks, & During Follow-Up Appointment, Scheduled on 01/21/2024 at 8:00 AM. Encouraged Routine Engagement with Dr. Emilio Aurora, Psychiatrist with [facility] [phone]), to Receive Psychotropic Medication Administration & Management, in An Effort to Reduce & Manage Symptoms of Anxiety, Depression & Panic Attacks, & During Follow-Up Appointment, Scheduled on 02/14/2024 at 3:00 PM.      Our next appointment is by telephone on 01/16/2024 at 10:30 am.  Please call the care guide team at [phone] if you need to cancel or reschedule your appointment.   If you are experiencing a Mental Health or Behavioral Health Crisis or need someone to talk to, please call the Suicide and Crisis Lifeline: 988 call the [place]  National Suicide Prevention Lifeline: (952)180-9925 or TTY: 754-501-2310 TTY (365)412-8190) to talk to a trained counselor call 1-800-273-TALK (toll free, 24 hour hotline) go to [facility] [address], [place] [phone]) call the [facility] Crisis Line: 317-811-4216 call 911  Patient verbalizes understanding of instructions and care plan provided today and agrees to view in MyChart. Active MyChart status and patient understanding of how to access instructions and care plan via MyChart confirmed with patient.     Telephone follow up appointment with care management team member scheduled for:  01/16/2024 at 10:30 am.  Philippe Desanctis, BSW, MSW, LCSW  Embedded Practice Social Work Case Manager  [facility], Population Health Direct Dial: [phone]  Fax: [phone] Email: [email or web address] Website: [email or web address]

## 2023-12-25 NOTE — Telephone Encounter (Signed)
 Copied from CRM [id number]. Topic: Clinical - Medication Refill >> Dec 25, 2023  4:14 PM Bascom RAMAN wrote: Most Recent Primary Care Visit:  Provider: CATHLENE KAUFMANN CAPPS  Department: [facility]  Visit Type: MYCHART VIDEO VISIT  Date: 12/25/2023  Medication: ***  Has the patient contacted their pharmacy?  (Agent: If no, request that the patient contact the pharmacy for the refill. If patient does not wish to contact the pharmacy document the reason why and proceed with request.) (Agent: If yes, when and what did the pharmacy advise?)  Is this the correct pharmacy for this prescription?  If no, delete pharmacy and type the correct one.  This is the patient's preferred pharmacy:  [facility] [place] - [address] [place] [address] Phone: [phone] Fax: [phone]   Has the prescription been filled recently?   Is the patient out of the medication?   Has the patient been seen for an appointment in the last year OR does the patient have an upcoming appointment?   Can we respond through MyChart?   Agent: Please be advised that Rx refills may take up to 3 business days. We ask that you follow-up with your pharmacy.

## 2023-12-25 NOTE — Progress Notes (Signed)
 Virtual Visit via Video Note  I connected with Marvin Espinoza on 12/25/23 at  3:50 PM EST by a video enabled telemedicine application and verified that I am speaking with the correct person using two identifiers.  Patient Location: Home Provider Location: Office/Clinic  I discussed the limitations, risks, security, and privacy concerns of performing an evaluation and management service by video and the availability of in person appointments. I also discussed with the patient that there may be a patient responsible charge related to this service. The patient expressed understanding and agreed to proceed.  Subjective: PCP: Jolinda Norene HERO, DO  Chief Complaint  Patient presents with   Cough   HPI States that symptoms started 10 days ago, cough, congestion, sore throat. States that he has green phlegm with cough. Reports eye crusting in the mornings. Endorses slight trouble breathing and chest tightness. States that it gets better after he coughs. Reports that he is taking mucinex and dayquil. Denies fever. Denies ear pain and slight sinus pressure.   ROS: Per HPI  Current Outpatient Medications:    ALPRAZolam  (XANAX ) 1 MG tablet, 1 mg. Six times per day prn, Disp: , Rfl:    Blood Glucose Monitoring Suppl (ONETOUCH VERIO) w/Device KIT, Use to test blood sugar daily as directed, Disp: 1 kit, Rfl: 0   calcium  carbonate (OS-CAL) 1250 (500 Ca) MG chewable tablet, Chew 1 tablet by mouth daily., Disp: , Rfl:    Calcium -Vitamin D -Vitamin K (SM CALCIUM  SOFT CHEWS PO), Take 1 application by mouth 2 (two) times daily. Chew calcium  tabs twice a day.  (bariatric surgery), Disp: , Rfl:    docusate sodium (COLACE) 100 MG capsule, Take 100 mg by mouth 2 (two) times daily., Disp: , Rfl:    Dulaglutide  (TRULICITY ) 0.75 MG/0.5ML SOAJ, Inject 0.75 mg into the skin once a week. DX: E11.65, Disp: 2 mL, Rfl: 5   famotidine  (PEPCID ) 20 MG tablet, TAKE ONE (1) TABLET BY MOUTH TWO (2) TIMES DAILY, Disp: 180  tablet, Rfl: 0   Ferrous Sulfate 50 MG TBCR, Take by mouth., Disp: , Rfl:    FLUoxetine  (PROZAC ) 20 MG tablet, Take 20 mg by mouth daily. (Patient not taking: Reported on 12/03/2023), Disp: , Rfl:    gabapentin  (NEURONTIN ) 300 MG capsule, TAKE 1 CAPSULE BY MOUTH THREE TIMES A DAY, Disp: 270 capsule, Rfl: 3   glucose blood test strip, test blood sugar 2x daily Dx E11.9, Disp: 100 each, Rfl: 3   hydrOXYzine  (VISTARIL ) 25 MG capsule, Take 25 mg by mouth 3 (three) times daily., Disp: , Rfl:    insulin degludec  (TRESIBA  FLEXTOUCH) 200 UNIT/ML FlexTouch Pen, Inject up to 60 units subcutaneously daily per provider guidance, Disp: 9 mL, Rfl: 5   Insulin Glargine-Lixisenatide (SOLIQUA ) 100-33 UNT-MCG/ML SOPN, INJECT 40-60 UNITS IN TO THE SKIN IN THEMORNING, Disp: 15 mL, Rfl: 0   lamoTRIgine  (LAMICTAL ) 200 MG tablet, Take 400 mg by mouth at bedtime. , Disp: , Rfl:    levothyroxine  (SYNTHROID ) 125 MCG tablet, Take 125 mcg by mouth daily before breakfast., Disp: , Rfl:    Magnesium  Gluconate (MAGNESIUM  27 PO), Take by mouth., Disp: , Rfl:    meclizine  (ANTIVERT ) 25 MG tablet, TAKE 1 TABLET BY MOUTH 3 TIMES DAILY AS NEEDED FOR DIZZINESS, Disp: 30 tablet, Rfl: 0   metFORMIN  (GLUCOPHAGE ) 1000 MG tablet, Take 1 tablet (1,000 mg total) by mouth 2 (two) times daily with a meal., Disp: 180 tablet, Rfl: 3   Multiple Vitamin (MULTIVITAMIN WITH MINERALS) TABS tablet,  Take 1 tablet by mouth 2 (two) times daily. For Vitamin supplementation (Patient taking differently: Take 1 tablet by mouth 2 (two) times daily. For Vitamin supplementation FROM GASTRIC BYPASS), Disp: , Rfl:    OLANZapine  (ZYPREXA ) 2.5 MG tablet, Take 2.5 mg by mouth at bedtime., Disp: , Rfl:    ondansetron  (ZOFRAN -ODT) 4 MG disintegrating tablet, Take 1-2 tablets (4-8 mg total) by mouth every 8 (eight) hours as needed for nausea or vomiting., Disp: 30 tablet, Rfl: 0   OneTouch Delica Lancets 30G MISC, Use to test blood sugar daily as directed, Disp: 100  each, Rfl: 3   pantoprazole  (PROTONIX ) 40 MG tablet, Take 1 tablet (40 mg total) by mouth 2 (two) times daily., Disp: 180 tablet, Rfl: 3   promethazine  (PHENERGAN ) 12.5 MG tablet, TAKE 1 TO 2 TABLETS EVERY 8 HOURS AS NEEDED FOR NAUSEA AND VOMITING. ONLY USE IF ABSOLUTELY NEEDED. Further fills PER GI!, Disp: 60 tablet, Rfl: 0   psyllium (METAMUCIL) 58.6 % powder, Take 1 packet by mouth 2 (two) times daily., Disp: , Rfl:    rosuvastatin  (CRESTOR ) 5 MG tablet, TAKE ONE (1) TABLET BY MOUTH EVERY DAY, Disp: 90 tablet, Rfl: 3   STEGLATRO  15 MG TABS tablet, TAKE 1 TABLET BY MOUTH EVERY MORNING BEFORE BREAKFAST, Disp: 90 tablet, Rfl: 0   tiZANidine  (ZANAFLEX ) 4 MG tablet, TAKE 1 TABLET BY MOUTH EVERY 8 HOURS AS NEEDED FOR MUSCLE SPASMS, Disp: 90 tablet, Rfl: 1   vitamin B-12 (CYANOCOBALAMIN ) 250 MCG tablet, Take 1 tablet (250 mcg total) by mouth daily. For low B-12 supplementation, Disp: 1 tablet, Rfl:   Observations/Objective: There were no vitals filed for this visit. Physical Exam Constitutional:      General: He is awake. He is not in acute distress.    Appearance: Normal appearance. He is well-developed and well-groomed. He is not ill-appearing, toxic-appearing or diaphoretic.  HENT:     Nose: Congestion present.  Pulmonary:     Effort: Pulmonary effort is normal.  Neurological:     General: No focal deficit present.     Mental Status: He is alert and oriented to person, place, and time.  Psychiatric:        Attention and Perception: Attention and perception normal.        Mood and Affect: Mood and affect normal.        Speech: Speech normal.        Behavior: Behavior normal. Behavior is cooperative.        Thought Content: Thought content normal.        Cognition and Memory: Cognition and memory normal.        Judgment: Judgment normal.    Assessment and Plan: 1. Acute cough (Primary) Will start medication as below. Encouraged patient to continue at home therapy with humidifier,  fluids, and mucinex. Discussed red flag symptoms.  - amoxicillin -clavulanate (AUGMENTIN ) 875-125 MG tablet; Take 1 tablet by mouth 2 (two) times daily for 7 days.  Dispense: 14 tablet; Refill: 0  2. Chest congestion As above.  - amoxicillin -clavulanate (AUGMENTIN ) 875-125 MG tablet; Take 1 tablet by mouth 2 (two) times daily for 7 days.  Dispense: 14 tablet; Refill: 0   Appt time 12/25/2023 03:50 PM Call duration: 00:05:21  Follow Up Instructions: Return if symptoms worsen or fail to improve.   I discussed the assessment and treatment plan with the patient. The patient was provided an opportunity to ask questions, and all were answered. The patient agreed with the plan and demonstrated  an understanding of the instructions.   The patient was advised to call back or seek an in-person evaluation if the symptoms worsen or if the condition fails to improve as anticipated.  The above assessment and management plan was discussed with the patient. The patient verbalized understanding of and has agreed to the management plan.   Marry Kins, DNP-FNP [facility] [address] [place], [place] [address] [phone]

## 2023-12-26 ENCOUNTER — Encounter: Payer: Self-pay | Admitting: *Deleted

## 2024-01-02 DIAGNOSIS — F4312 Post-traumatic stress disorder, chronic: Secondary | ICD-10-CM | POA: Diagnosis not present

## 2024-01-09 ENCOUNTER — Ambulatory Visit: Payer: 59 | Admitting: Family Medicine

## 2024-01-09 ENCOUNTER — Encounter: Payer: Self-pay | Admitting: Family Medicine

## 2024-01-09 VITALS — BP 142/80 | HR 60 | Temp 97.9°F | Ht 70.0 in | Wt 271.0 lb

## 2024-01-09 DIAGNOSIS — Z794 Long term (current) use of insulin: Secondary | ICD-10-CM

## 2024-01-09 DIAGNOSIS — E1169 Type 2 diabetes mellitus with other specified complication: Secondary | ICD-10-CM | POA: Diagnosis not present

## 2024-01-09 DIAGNOSIS — E039 Hypothyroidism, unspecified: Secondary | ICD-10-CM

## 2024-01-09 DIAGNOSIS — Z9889 Other specified postprocedural states: Secondary | ICD-10-CM | POA: Diagnosis not present

## 2024-01-09 DIAGNOSIS — E785 Hyperlipidemia, unspecified: Secondary | ICD-10-CM

## 2024-01-09 DIAGNOSIS — E119 Type 2 diabetes mellitus without complications: Secondary | ICD-10-CM

## 2024-01-09 LAB — BAYER DCA HB A1C WAIVED: HB A1C (BAYER DCA - WAIVED): 6.1 % — ABNORMAL HIGH (ref 4.8–5.6)

## 2024-01-09 NOTE — Progress Notes (Signed)
Subjective: CC:DM PCP: Raliegh Ip, DO QIO:[id number] [name] is a 53 y.o. male presenting to clinic today for:  1. Type 2 Diabetes with hyperlipidemia:  Patient reports compliance with medications.  He tried switching over to [place] but found that his sugars were not as well-controlled so he switched back to [place] and is in currently injecting 40 units.  He did not follow a strict diet over the holidays but hopes to get back to it.  He admits that they do not always eat the best Diabetes Health Maintenance Due  Topic Date Due   OPHTHALMOLOGY EXAM  03/17/2024   HEMOGLOBIN A1C  04/06/2024   FOOT EXAM  06/26/2024    Last A1c:  Lab Results  Component Value Date   HGBA1C 6.1 (H) 10/07/2023    ROS: He reports no chest pain, shortness of breath.  He is recovering from a URI and is status posttreatment with oral antibiotics.  He does report some increase stress.  His cat is ill and he worries a lot about his son  2.  Hypothyroidism Compliant with medications.  Still struggles with weight gain.  He has been having some mood instability but again reports anxiety as above  ROS: Per HPI  Allergies  Allergen Reactions   Abilify [Aripiprazole] Anaphylaxis and Swelling    "slurred speech" FACIAL DROOPING, TONGUE SWELLING, TARDIVE DYSKENESIA    Caplyta [Lumateperone] Anaphylaxis   Pregabalin Shortness Of Breath   Seroquel [Quetiapine Fumarate]     tardive dyskinesia   Alcohol Itching    Drinking alcohol   Ambien [Zolpidem] Other (See Comments)    NIGHT TERRORS   Anacin-3 [Acetaminophen] Itching    REGULAR TYLENOL IS OKAY.  NEEDS BENADRYL TO TAKE THIS   Cariprazine Other (See Comments)    Tardive dyskenesia   Cariprazine Hcl Itching   Latuda [Lurasidone Hcl] Other (See Comments)    RUNS SUGAR TOO HIGH   Lurasidone Other (See Comments)    TARDIVE DYSKINESIA   Prozac [Fluoxetine] Itching   Victoza [Liraglutide] Other (See Comments)    Severe heartburn     Hydrocodone Other (See Comments)   Bupropion Rash   Hydrocodone-Acetaminophen Itching   Risperidone Rash   Tegretol [Carbamazepine] Rash   Past Medical History:  Diagnosis Date   Anxiety    Bipolar disorder (HCC)    CTS (carpal tunnel syndrome)    Depression    Diabetes mellitus without complication (HCC)    GERD (gastroesophageal reflux disease)    H/O bariatric surgery 2016   Head injury 05/06/2017   Hypothyroidism    Hypothyroidism    Sleep apnea    no longer with OSA d/t over 100 pd weight loss    Current Outpatient Medications:    ALPRAZolam (XANAX) 1 MG tablet, 1 mg. Six times per day prn, Disp: , Rfl:    Blood Glucose Monitoring Suppl (ONETOUCH VERIO) w/Device KIT, Use to test blood sugar daily as directed, Disp: 1 kit, Rfl: 0   calcium carbonate (OS-CAL) 1250 (500 Ca) MG chewable tablet, Chew 1 tablet by mouth daily., Disp: , Rfl:    Calcium-Vitamin D-Vitamin K (SM CALCIUM SOFT CHEWS PO), Take 1 application by mouth 2 (two) times daily. Chew calcium tabs twice a day.  (bariatric surgery), Disp: , Rfl:    docusate sodium (COLACE) 100 MG capsule, Take 100 mg by mouth 2 (two) times daily., Disp: , Rfl:    Dulaglutide (TRULICITY) 0.75 MG/0.5ML SOAJ, Inject 0.75 mg into the skin once a  week. DX: E11.65, Disp: 2 mL, Rfl: 5   famotidine (PEPCID) 20 MG tablet, TAKE ONE (1) TABLET BY MOUTH TWO (2) TIMES DAILY, Disp: 180 tablet, Rfl: 0   Ferrous Sulfate 50 MG TBCR, Take by mouth., Disp: , Rfl:    FLUoxetine (PROZAC) 20 MG tablet, Take 20 mg by mouth daily. (Patient not taking: Reported on 12/03/2023), Disp: , Rfl:    gabapentin (NEURONTIN) 300 MG capsule, TAKE 1 CAPSULE BY MOUTH THREE TIMES A DAY, Disp: 270 capsule, Rfl: 3   glucose blood test strip, test blood sugar 2x daily Dx E11.9, Disp: 100 each, Rfl: 3   hydrOXYzine (VISTARIL) 25 MG capsule, Take 25 mg by mouth 3 (three) times daily., Disp: , Rfl:    insulin degludec (TRESIBA FLEXTOUCH) 200 UNIT/ML FlexTouch Pen, Inject up to 60  units subcutaneously daily per provider guidance, Disp: 9 mL, Rfl: 5   Insulin Glargine-Lixisenatide (SOLIQUA) 100-33 UNT-MCG/ML SOPN, INJECT 40-60 UNITS IN TO THE SKIN IN THEMORNING, Disp: 15 mL, Rfl: 0   lamoTRIgine (LAMICTAL) 200 MG tablet, Take 400 mg by mouth at bedtime. , Disp: , Rfl:    levothyroxine (SYNTHROID) 125 MCG tablet, Take 125 mcg by mouth daily before breakfast., Disp: , Rfl:    Magnesium Gluconate (MAGNESIUM 27 PO), Take by mouth., Disp: , Rfl:    meclizine (ANTIVERT) 25 MG tablet, TAKE 1 TABLET BY MOUTH 3 TIMES DAILY AS NEEDED FOR DIZZINESS, Disp: 30 tablet, Rfl: 0   metFORMIN (GLUCOPHAGE) 1000 MG tablet, Take 1 tablet (1,000 mg total) by mouth 2 (two) times daily with a meal., Disp: 180 tablet, Rfl: 3   Multiple Vitamin (MULTIVITAMIN WITH MINERALS) TABS tablet, Take 1 tablet by mouth 2 (two) times daily. For Vitamin supplementation (Patient taking differently: Take 1 tablet by mouth 2 (two) times daily. For Vitamin supplementation FROM GASTRIC BYPASS), Disp: , Rfl:    OLANZapine (ZYPREXA) 2.5 MG tablet, Take 2.5 mg by mouth at bedtime., Disp: , Rfl:    ondansetron (ZOFRAN-ODT) 4 MG disintegrating tablet, Take 1-2 tablets (4-8 mg total) by mouth every 8 (eight) hours as needed for nausea or vomiting., Disp: 30 tablet, Rfl: 0   OneTouch Delica Lancets 30G MISC, Use to test blood sugar daily as directed, Disp: 100 each, Rfl: 3   pantoprazole (PROTONIX) 40 MG tablet, Take 1 tablet (40 mg total) by mouth 2 (two) times daily., Disp: 180 tablet, Rfl: 3   promethazine (PHENERGAN) 12.5 MG tablet, TAKE 1 TO 2 TABLETS EVERY 8 HOURS AS NEEDED FOR NAUSEA AND VOMITING. ONLY USE IF ABSOLUTELY NEEDED. Further fills PER GI!, Disp: 60 tablet, Rfl: 0   psyllium (METAMUCIL) 58.6 % powder, Take 1 packet by mouth 2 (two) times daily., Disp: , Rfl:    rosuvastatin (CRESTOR) 5 MG tablet, TAKE ONE (1) TABLET BY MOUTH EVERY DAY, Disp: 90 tablet, Rfl: 0   STEGLATRO 15 MG TABS tablet, TAKE 1 TABLET BY MOUTH  EVERY MORNING BEFORE BREAKFAST, Disp: 90 tablet, Rfl: 0   tiZANidine (ZANAFLEX) 4 MG tablet, TAKE 1 TABLET BY MOUTH EVERY 8 HOURS AS NEEDED FOR MUSCLE SPASMS, Disp: 90 tablet, Rfl: 1   vitamin B-12 (CYANOCOBALAMIN) 250 MCG tablet, Take 1 tablet (250 mcg total) by mouth daily. For low B-12 supplementation, Disp: 1 tablet, Rfl:  Social History   Socioeconomic History   Marital status: Married    Spouse name: Ajay Strubel   Number of children: 1   Years of education: 12   Highest education level: Never attended school  Occupational History   Not on file  Tobacco Use   Smoking status: Never    Passive exposure: Never   Smokeless tobacco: Never  Vaping Use   Vaping status: Never Used  Substance and Sexual Activity   Alcohol use: No    Comment: severe allergy to alcohol!!   Drug use: No   Sexual activity: Yes    Partners: Female  Other Topics Concern   Not on file  Social History Narrative   Lives with Wife, and one child   Two story   He stays there to keep an eye on the house.    His son comes to visit also.   Right handed.   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          Social Drivers of Corporate investment banker Strain: High Risk (01/05/2024)   Overall Financial Resource Strain (CARDIA)    Difficulty of Paying Living Expenses: Very hard  Food Insecurity: Food Insecurity Present (01/05/2024)   Hunger Vital Sign    Worried About Running Out of Food in the Last Year: Sometimes true    Ran Out of Food in the Last Year: Often true  Transportation Needs: No Transportation Needs (01/05/2024)   PRAPARE - Administrator, Civil Service (Medical): No    Lack of Transportation (Non-Medical): No  Physical Activity: Sufficiently Active (01/05/2024)   Exercise Vital Sign    Days of Exercise per Week: 1 day    Minutes of Exercise per Session: 150+ min  Stress: Stress Concern Present (01/05/2024)   [employer] of Occupational Health - Occupational Stress Questionnaire     Feeling of Stress : Very much  Social Connections: Moderately Integrated (01/05/2024)   Social Connection and Isolation Panel [NHANES]    Frequency of Communication with Friends and Family: Three times a week    Frequency of Social Gatherings with Friends and Family: Once a week    Attends Religious Services: Never    Database administrator or Organizations: No    Attends Engineer, structural: More than 4 times per year    Marital Status: Married  Catering manager Violence: Not At Risk (12/25/2023)   Humiliation, Afraid, Rape, and Kick questionnaire    Fear of Current or Ex-Partner: No    Emotionally Abused: No    Physically Abused: No    Sexually Abused: No   Family History  Problem Relation Age of Onset   Diabetes Mother    Heart disease Mother    Depression Mother    Esophageal cancer Mother    COPD Father    Heart disease Father    Kidney disease Father    Mental illness Father    Depression Father    Mental illness Sister    Depression Sister    Depression Son    Diabetes Mellitus II Son    Hyperlipidemia Son    Esophageal cancer Maternal Uncle    Colon cancer Neg Hx    Rectal cancer Neg Hx    Stomach cancer Neg Hx     Objective: Office vital signs reviewed. BP (!) 142/80   Pulse 60   Temp 97.9 F (36.6 C)   Ht 5\' 10"  (1.778 m)   Wt 271 lb (122.9 kg)   SpO2 97%   BMI 38.88 kg/m   Physical Examination:  General: Awake, alert, well nourished, No acute distress HEENT: Sclera white.  No exophthalmos.  Cardio: regular rate and rhythm, S1S2  heard, no murmurs appreciated Pulm: clear to auscultation bilaterally, no wheezes, rhonchi or rales; normal work of breathing on room air  Assessment/ Plan: 53 y.o. male   Insulin-requiring or dependent type II diabetes mellitus (HCC)  Hyperlipidemia associated with type 2 diabetes mellitus (HCC) - Plan: Bayer DCA Hb A1c Waived  Acquired hypothyroidism - Plan: TSH + free T4  History of gastric surgery - Plan:  Vitamin B12, Iron, TIBC and Ferritin Panel  Sugar well-controlled with A1c at 6.1 today.  Continue current regimen.  Continue statin at low-dose.  Reinforced low-fat, low-carb diet with increased fiber through vegetables  Check thyroid levels.  Check B12, iron given history of gastric surgery  Marvin Espinoza Hulen Skains, DO [facility] [phone]

## 2024-01-10 LAB — IRON,TIBC AND FERRITIN PANEL
Ferritin: 36 ng/mL (ref 30–400)
Iron Saturation: 35 % (ref 15–55)
Iron: 123 ug/dL (ref 38–169)
Total Iron Binding Capacity: 356 ug/dL (ref 250–450)
UIBC: 233 ug/dL (ref 111–343)

## 2024-01-10 LAB — VITAMIN B12: Vitamin B-12: >2000 pg/mL — ABNORMAL HIGH (ref 232–1245)

## 2024-01-10 LAB — TSH+FREE T4
Free T4: 1.1 ng/dL (ref 0.82–1.77)
TSH: 5.91 u[IU]/mL — ABNORMAL HIGH (ref 0.450–4.500)

## 2024-01-12 ENCOUNTER — Other Ambulatory Visit: Payer: Self-pay | Admitting: Family Medicine

## 2024-01-12 ENCOUNTER — Encounter: Payer: Self-pay | Admitting: Family Medicine

## 2024-01-12 DIAGNOSIS — E039 Hypothyroidism, unspecified: Secondary | ICD-10-CM

## 2024-01-16 ENCOUNTER — Ambulatory Visit: Payer: Self-pay | Admitting: *Deleted

## 2024-01-16 NOTE — Patient Outreach (Signed)
Care Coordination   Follow Up Visit Note   01/16/2024  Name: Marvin Espinoza MRN: [id number] DOB: October 08, 1971  Marvin Espinoza is a 53 y.o. year old male who sees Raliegh Ip, DO for primary care. I spoke with [name] by phone today.  What matters to the patients health and wellness today?  Reduce & Manage Symptoms of Anxiety & Depression.   Goals Addressed               This Visit's Progress     Reduce & Manage Symptoms of Anxiety & Depression. (pt-stated)   On track     Care Coordination Interventions:  Interventions Today    Flowsheet Row Most Recent Value  Chronic Disease   Chronic disease during today's visit Diabetes, Other  [Adjustment Disorder w/ Anxious Mood, Adjustment Insomnia, Bipolar Affective Psychosis, Drug Overdose, Generalized Anxiety Disorder, History of Concussion, Recurrent Severe Major Depressive Disorder, Mood Disorder, PTSD, Neuropathy, Tardive Dyskinesia.]  General Interventions   General Interventions Discussed/Reviewed General Interventions Discussed, Labs, Vaccines, Doctor Visits, Health Screening, Annual Foot Exam, General Interventions Reviewed, Lipid Profile, Annual Eye Exam, Durable Medical Equipment (DME), Community Resources, Level of Care, Communication with  [Encouraged Routine Engagement with Care Team Members & Providers.]  Labs Hgb A1c every 3 months, Kidney Function, Hgb A1c annually  [Reviewed & Encouraged Daily Monitoring & Logging Results.]  Vaccines COVID-19, Flu, Pneumonia, RSV, Shingles, Tetanus/Pertussis/Diphtheria  [Encouraged Routine Vaccinations.]  Doctor Visits Discussed/Reviewed Doctor Visits Discussed, Specialist, Annual Wellness Visits, Doctor Visits Reviewed, PCP  [Encouraged Routine Engagement with Care Team Members & Providers.]  Health Screening Bone Density, Colonoscopy, Prostate  [Encouraged Routine Health Screenings.]  Durable Medical Equipment (DME) Glucomoter, BP Cuff, Other  [Prescription Eyeglasses, Hand-Held  Shower Hose, Engineer, materials in [place], [place].]  PCP/Specialist Visits Compliance with follow-up visit  [Encouraged Routine Engagement with Care Team Members & Providers.]  Communication with PCP/Specialists, Charity fundraiser, Pharmacists, Social Work  [employer] Routine Engagement with Care Team Members & Providers.]  Level of Care Adult Daycare, Air traffic controller, Assisted Living, Skilled Nursing Facility  [Confirmed Disinterest in Enrollment in Adult Day Care Program or Receiving Assistance Pursuing Higher Level of Care Placement Options (I.e Assisted Living Versus Skilled Nursing Facility).]  Applications Medicaid, Personal Care Services  [Confirmed Disinterest in Applying for Medicaid or Personal Care Services.]  Exercise Interventions   Exercise Discussed/Reviewed Exercise Discussed, Assistive device use and maintanence, Exercise Reviewed, Physical Activity, Weight Managment  [Encouraged Daily Exercise Regimen, As Tolerated.]  Physical Activity Discussed/Reviewed Physical Activity Discussed, Home Exercise Program (HEP), Physical Activity Reviewed, PREP, Gym, Types of exercise  [Encouraged Increased Engagement in Activities of Interest, Inside & Outside the Home.]  Weight Management Weight loss  [Encouraged Healthy Weight Loss Regimen.]  Education Interventions   Education Provided Provided Therapist, sports, Provided Education, Provided Web-based Education  [employer] Reviewed Educational Material to [employer] & Entertain Questions.]  Provided Verbal Education On Nutrition, Mental Health/Coping with Illness, When to see the doctor, Foot Care, Eye Care, Blood Sugar Monitoring, Labs, Applications, Exercise, Medication, [employer], Development worker, community, Sick Day Rules  [Encouraged Continued Independent Review of Educational Material Provided.]  Labs Reviewed Hgb A1c  [Reviewed.]  Ship broker, Personal Care Services  [Confirmed Disinterest in Applying for [employer] or Personal Care Services.]  Mental  Health Interventions   Mental Health Discussed/Reviewed Mental Health Discussed, Anxiety, Mental Health Reviewed, Depression, Grief and Loss, Coping Strategies, Crisis, Other, Suicide, Substance Abuse  [Assessed Mental Health & Cognitive Status.]  Nutrition Interventions   Nutrition Discussed/Reviewed Nutrition Discussed,  Adding fruits and vegetables, Fluid intake, Increasing proteins, Decreasing fats, Nutrition Reviewed, Carbohydrate meal planning, Portion sizes, Decreasing salt, Decreasing sugar intake  [Encouraged Diabetic-Friendly, Reduced Fat, Low Sodium Diet.]  Pharmacy Interventions   Pharmacy Dicussed/Reviewed Pharmacy Topics Discussed, Medications and their functions, Pharmacy Topics Reviewed, Medication Adherence, Affording Medications  [Confirmed Ability to Afford Prescription Medications.]  Medication Adherence --  [Confirmed Compliance with Prescription Medications.]  Safety Interventions   Safety Discussed/Reviewed Safety Discussed, Safety Reviewed  [Encouraged Routine Use of Assistive Devices & Durable Medical Equipment.]  Home Safety Assistive Devices, Need for home safety assessment  [Encouraged Consideration of Home Safety Evaluation.]  Advanced Directive Interventions   Advanced Directives Discussed/Reviewed Advanced Directives Discussed, Advanced Directives Reviewed  Engineer, building services (Living Will & Healthcare Power of Attorney Documents) Initiated. Copies Requested to Scan into Electronic Medical Record in Epic.]      Active Listening & Reflection Utilized.  Verbalization of Feelings Encouraged.  Emotional Support Provided.   Acceptance & Commitment Therapy Indicated. Cognitive Behavioral Therapy Conducted. Client-Centered Therapy Initiated.  Encouraged Daily Journaling, As a Means of Expressing Thoughts, Feelings, Emotions, Symptoms, Etc. Encouraged Daily Implementation of Deep Breathing Exercises, Relaxation Techniques, & Mindfulness Meditation  Strategies. Encouraged Routine Engagement with Dr. [name], Sports Medicine Physician with [facility] [phone]), to Receive Rehabilitative Services for Concussion, in An Effort to Reduce & Manage Symptoms of Anxiety, Depression & Panic Attacks, & During Follow-Up Appointment, Scheduled on 01/21/2024 at 8:00 AM. Encouraged Routine Engagement with Danford Bad, Licensed Clinical Social Worker with [facility] [phone]), if You Have Questions, Need Assistance, or If Additional Social Work Needs Are Identified Between Now & Our Next Follow-Up Outreach Call, Scheduled on 02/13/2024 at 9:00 AM. Encouraged Routine Engagement with Dr. Milagros Evener, Psychiatrist with [facility] [phone]), to Receive Psychotropic Medication Administration & Management, in An Effort to Reduce & Manage Symptoms of Anxiety, Depression & Panic Attacks, & During Follow-Up Appointment, Scheduled on 02/14/2024 at 3:00 PM.      SDOH assessments and interventions completed:  Yes.  Care Coordination Interventions:  Yes, provided.   Follow up plan: Follow up call scheduled for 02/13/2024 at 9:00 am.  Encounter Outcome:  Patient Visit Completed.    Danford Bad, BSW, MSW, LCSW [facility], [facility] Clinical Social Worker II Direct Dial: [phone]  Fax: [phone] Website: [email or web address]

## 2024-01-16 NOTE — Patient Instructions (Signed)
Visit Information  Thank you for taking time to visit with me today. Please don't hesitate to contact me if I can be of assistance to you.   Following are the goals we discussed today:   Goals Addressed               This Visit's Progress     Reduce & Manage Symptoms of Anxiety & Depression. (pt-stated)   On track     Care Coordination Interventions:  Interventions Today    Flowsheet Row Most Recent Value  Chronic Disease   Chronic disease during today's visit Diabetes, Other  [Adjustment Disorder w/ Anxious Mood, Adjustment Insomnia, Bipolar Affective Psychosis, Drug Overdose, Generalized Anxiety Disorder, History of Concussion, Recurrent Severe Major Depressive Disorder, Mood Disorder, PTSD, Neuropathy, Tardive Dyskinesia.]  General Interventions   General Interventions Discussed/Reviewed General Interventions Discussed, Labs, Vaccines, Doctor Visits, Health Screening, Annual Foot Exam, General Interventions Reviewed, Lipid Profile, Annual Eye Exam, Durable Medical Equipment (DME), Community Resources, Level of Care, Communication with  [Encouraged Routine Engagement with Care Team Members & Providers.]  Labs Hgb A1c every 3 months, Kidney Function, Hgb A1c annually  [Reviewed & Encouraged Daily Monitoring & Logging Results.]  Vaccines COVID-19, Flu, Pneumonia, RSV, Shingles, Tetanus/Pertussis/Diphtheria  [Encouraged Routine Vaccinations.]  Doctor Visits Discussed/Reviewed Doctor Visits Discussed, Specialist, Annual Wellness Visits, Doctor Visits Reviewed, PCP  [Encouraged Routine Engagement with Care Team Members & Providers.]  Health Screening Bone Density, Colonoscopy, Prostate  [Encouraged Routine Health Screenings.]  Durable Medical Equipment (DME) Glucomoter, BP Cuff, Other  [Prescription Eyeglasses, Hand-Held Shower Hose, Engineer, materials in [place], [place].]  PCP/Specialist Visits Compliance with follow-up visit  [Encouraged Routine Engagement with Care Team Members & Providers.]   Communication with PCP/Specialists, Charity fundraiser, Pharmacists, Social Work  [employer] Routine Engagement with Care Team Members & Providers.]  Level of Care Adult Daycare, Air traffic controller, Assisted Living, Skilled Nursing Facility  [Confirmed Disinterest in Enrollment in Adult Day Care Program or Receiving Assistance Pursuing Higher Level of Care Placement Options (I.e Assisted Living Versus Skilled Nursing Facility).]  Applications Medicaid, Personal Care Services  [Confirmed Disinterest in Applying for Medicaid or Personal Care Services.]  Exercise Interventions   Exercise Discussed/Reviewed Exercise Discussed, Assistive device use and maintanence, Exercise Reviewed, Physical Activity, Weight Managment  [Encouraged Daily Exercise Regimen, As Tolerated.]  Physical Activity Discussed/Reviewed Physical Activity Discussed, Home Exercise Program (HEP), Physical Activity Reviewed, PREP, Gym, Types of exercise  [Encouraged Increased Engagement in Activities of Interest, Inside & Outside the Home.]  Weight Management Weight loss  [Encouraged Healthy Weight Loss Regimen.]  Education Interventions   Education Provided Provided Therapist, sports, Provided Education, Provided Web-based Education  [employer] Reviewed Educational Material to [employer] & Entertain Questions.]  Provided Verbal Education On Nutrition, Mental Health/Coping with Illness, When to see the doctor, Foot Care, Eye Care, Blood Sugar Monitoring, Labs, Applications, Exercise, Medication, [employer], Development worker, community, Sick Day Rules  [Encouraged Continued Independent Review of Educational Material Provided.]  Labs Reviewed Hgb A1c  [Reviewed.]  Ship broker, Personal Care Services  [Confirmed Disinterest in Applying for [employer] or Personal Care Services.]  Mental Health Interventions   Mental Health Discussed/Reviewed Mental Health Discussed, Anxiety, Mental Health Reviewed, Depression, Grief and Loss, Coping Strategies,  Crisis, Other, Suicide, Substance Abuse  [Assessed Mental Health & Cognitive Status.]  Nutrition Interventions   Nutrition Discussed/Reviewed Nutrition Discussed, Adding fruits and vegetables, Fluid intake, Increasing proteins, Decreasing fats, Nutrition Reviewed, Carbohydrate meal planning, Portion sizes, Decreasing salt, Decreasing sugar intake  [Encouraged Diabetic-Friendly, Reduced Fat, Low Sodium  Diet.]  Pharmacy Interventions   Pharmacy Dicussed/Reviewed Pharmacy Topics Discussed, Medications and their functions, Pharmacy Topics Reviewed, Medication Adherence, Affording Medications  [Confirmed Ability to Afford Prescription Medications.]  Medication Adherence --  [Confirmed Compliance with Prescription Medications.]  Safety Interventions   Safety Discussed/Reviewed Safety Discussed, Safety Reviewed  [Encouraged Routine Use of Assistive Devices & Durable Medical Equipment.]  Home Safety Assistive Devices, Need for home safety assessment  [Encouraged Consideration of Home Safety Evaluation.]  Advanced Directive Interventions   Advanced Directives Discussed/Reviewed Advanced Directives Discussed, Advanced Directives Reviewed  [Confirmed Advanced Directives (Living Will & Healthcare Power of Attorney Documents) Initiated. Copies Requested to Scan into Electronic Medical Record in Epic.]      Active Listening & Reflection Utilized.  Verbalization of Feelings Encouraged.  Emotional Support Provided.   Acceptance & Commitment Therapy Indicated. Cognitive Behavioral Therapy Conducted. Client-Centered Therapy Initiated.  Encouraged Daily Journaling, As a Means of Expressing Thoughts, Feelings, Emotions, Symptoms, Etc. Encouraged Daily Implementation of Deep Breathing Exercises, Relaxation Techniques, & Mindfulness Meditation Strategies. Encouraged Routine Engagement with Dr. [name], Sports Medicine Physician with [facility] [phone]), to Receive  Rehabilitative Services for Concussion, in An Effort to Reduce & Manage Symptoms of Anxiety, Depression & Panic Attacks, & During Follow-Up Appointment, Scheduled on 01/21/2024 at 8:00 AM. Encouraged Routine Engagement with Danford Bad, Licensed Clinical Social Worker with [facility], [facility] [phone]), if You Have Questions, Need Assistance, or If Additional Social Work Needs Are Identified Between Now & Our Next Follow-Up Outreach Call, Scheduled on 02/13/2024 at 9:00 AM. Encouraged Routine Engagement with Dr. Milagros Evener, Psychiatrist with [facility] [phone]), to Receive Psychotropic Medication Administration & Management, in An Effort to Reduce & Manage Symptoms of Anxiety, Depression & Panic Attacks, & During Follow-Up Appointment, Scheduled on 02/14/2024 at 3:00 PM.      Our next appointment is by telephone on 02/13/2024 at 9:00 am.  Please call the care guide team at [phone] if you need to cancel or reschedule your appointment.   If you are experiencing a Mental Health or Behavioral Health Crisis or need someone to talk to, please call the Suicide and Crisis Lifeline: 988 call the [place] National Suicide Prevention Lifeline: 210-080-1171 or TTY: (747)197-4443 TTY 908-412-8726) to talk to a trained counselor call 1-800-273-TALK (toll free, 24 hour hotline) go to [facility] [address], [place] [phone]) call the [facility] Crisis Line: [phone] call 911  Patient verbalizes understanding of instructions and care plan provided today and agrees to view in MyChart. Active MyChart status and patient understanding of how to access instructions and care plan via MyChart confirmed with patient.     Telephone follow up appointment with care management team member scheduled for:  02/13/2024 at 9:00 am.   Danford Bad, BSW, MSW, LCSW [place]   [facility], [facility] Clinical Social Worker II Direct Dial: [phone]  Fax: [phone] Website: [email or web address]

## 2024-01-20 NOTE — Progress Notes (Signed)
   I, Leotis Batter, CMA acting as a scribe for Artist Lloyd, MD.  Marvin Espinoza is a 53 y.o. male who presents to [employer] [facility] at [facility] today for f/u concussion. On 9/15, he slipped on some water in a [employer] bathroom, hitting his head on the floor. Pt was last seen by Dr. Lloyd on 12/03/23 and was advised to restart SGLT2 and f/u w/ PCP.  At the last visit additionally he had stopped his Zyprexa  and his mood worsen.  I encouraged him to restart the Zyprexa  for mood.  He has done so and mood is much better today.  Today, pt reports a few episodes of worsening dizziness when laying,sit-to-stand, washing cars. HEP is difficult to do at home.   He continues home exercise program taught by vestibular physical therapy.  Pertinent review of systems: No fevers or chills  Relevant historical information: Bipolar disorder and diabetes   Exam:  BP 130/82   Pulse 75   Ht 5' 10 (1.778 m)   Wt 271 lb (122.9 kg)   SpO2 98%   BMI 38.88 kg/m  General: Well Developed, well nourished, and in no acute distress.   Neuropsych: Alert and oriented normal coordination and gait.  Slightly impaired balance.      Assessment and Plan: 53 y.o. male with concussion after falling and hitting his head in September.  He is improving but not fully better.  Plan to continue home exercise program and a bit of watchful waiting for the next 3 months.  We could restart vestibular therapy if needed.  For now keep home exercise program and advance activity as tolerated.  Recheck in 3 months.  Anticipate if better at that visit we will write a letter for maximum medical improvement.   PDMP not reviewed this encounter. No orders of the defined types were placed in this encounter.  No orders of the defined types were placed in this encounter.    Discussed warning signs or symptoms. Please see discharge instructions. Patient expresses understanding.   The above documentation has been  reviewed and is accurate and complete Artist Lloyd, M.D.  Total encounter time 20 minutes including face-to-face time with the patient and, reviewing past medical record, and charting on the date of service.

## 2024-01-21 ENCOUNTER — Encounter: Payer: Self-pay | Admitting: Family Medicine

## 2024-01-21 ENCOUNTER — Ambulatory Visit: Payer: 59 | Admitting: Family Medicine

## 2024-01-21 VITALS — BP 130/82 | HR 75 | Ht 70.0 in | Wt 271.0 lb

## 2024-01-21 DIAGNOSIS — S060X0D Concussion without loss of consciousness, subsequent encounter: Secondary | ICD-10-CM | POA: Diagnosis not present

## 2024-01-21 NOTE — Patient Instructions (Signed)
 Thank you for coming in today.   Ok to advance activity as tolerated.   Recheck in 3 months.   Let me know sooner if you have a problem or a set back.

## 2024-01-26 ENCOUNTER — Ambulatory Visit: Payer: Self-pay | Admitting: *Deleted

## 2024-01-26 ENCOUNTER — Other Ambulatory Visit: Payer: Self-pay | Admitting: Family Medicine

## 2024-01-26 DIAGNOSIS — M51369 Other intervertebral disc degeneration, lumbar region without mention of lumbar back pain or lower extremity pain: Secondary | ICD-10-CM

## 2024-01-26 NOTE — Patient Instructions (Signed)
 Visit Information  Thank you for taking time to visit with me today. Please don't hesitate to contact me if I can be of assistance to you.   Following are the goals we discussed today:   Goals Addressed               This Visit's Progress     Reduce & Manage Symptoms of Anxiety & Depression. (pt-stated)   On track     Care Coordination Interventions:  Interventions Today    Flowsheet Row Most Recent Value  Chronic Disease   Chronic disease during today's visit Diabetes, Other  [Adjustment Disorder w/ Anxious Mood, Adjustment Insomnia, Bipolar Affective Psychosis, Drug Overdose, Generalized Anxiety Disorder, History of Concussion, Recurrent Severe Major Depressive Disorder, Mood Disorder, PTSD, Neuropathy, Tardive Dyskinesia.]  General Interventions   General Interventions Discussed/Reviewed General Interventions Discussed, Labs, Vaccines, Doctor Visits, Health Screening, Annual Foot Exam, General Interventions Reviewed, Lipid Profile, Annual Eye Exam, Durable Medical Equipment (DME), Community Resources, Level of Care, Communication with  [Encouraged Routine Engagement with Care Team Members & Providers.]  Labs Hgb A1c every 3 months, Kidney Function, Hgb A1c annually  [Reviewed & Encouraged Daily Monitoring & Logging Results.]  Vaccines COVID-19, Flu, Pneumonia, RSV, Shingles, Tetanus/Pertussis/Diphtheria  [Encouraged Routine Vaccinations.]  Doctor Visits Discussed/Reviewed Doctor Visits Discussed, Specialist, Annual Wellness Visits, Doctor Visits Reviewed, PCP  [Encouraged Routine Engagement with Care Team Members & Providers.]  Health Screening Bone Density, Colonoscopy, Prostate  [Encouraged Routine Health Screenings.]  Durable Medical Equipment (DME) Glucomoter, BP Cuff, Other  [Prescription Eyeglasses, Hand-Held Shower Hose, Engineer, materials in [place], [place].]  PCP/Specialist Visits Compliance with follow-up visit  [Encouraged Routine Engagement with Care Team Members & Providers.]   Communication with PCP/Specialists, Charity fundraiser, Pharmacists, Social Work  [employer] Routine Engagement with Care Team Members & Providers.]  Level of Care Adult Daycare, Air traffic controller, Assisted Living, Skilled Nursing Facility  [Confirmed Disinterest in Enrollment in Adult Day Care Program or Receiving Assistance Pursuing Higher Level of Care Placement Options (I.e Assisted Living Versus Skilled Nursing Facility).]  Applications Medicaid, Personal Care Services  [Confirmed Disinterest in Applying for Medicaid or Personal Care Services.]  Exercise Interventions   Exercise Discussed/Reviewed Exercise Discussed, Assistive device use and maintanence, Exercise Reviewed, Physical Activity, Weight Managment  [Encouraged Daily Exercise Regimen, As Tolerated.]  Physical Activity Discussed/Reviewed Physical Activity Discussed, Home Exercise Program (HEP), Physical Activity Reviewed, PREP, Gym, Types of exercise  [Encouraged Increased Engagement in Activities of Interest, Inside & Outside the Home.]  Weight Management Weight loss  [Encouraged Healthy Weight Loss Regimen.]  Education Interventions   Education Provided Provided Therapist, sports, Provided Education, Provided Web-based Education  [employer] Reviewed Educational Material to [employer] Questions.]  Provided Verbal Education On Nutrition, Mental Health/Coping with Illness, When to see the doctor, Foot Care, Eye Care, Blood Sugar Monitoring, Labs, Applications, Exercise, Medication, [employer], Development worker, community, Sick Day Rules  [Encouraged Continued Independent Review of Educational Material Provided.]  Labs Reviewed Hgb A1c  [Reviewed.]  Ship broker, Personal Care Services  [Confirmed Disinterest in Applying for [employer] or Personal Care Services.]  Mental Health Interventions   Mental Health Discussed/Reviewed Mental Health Discussed, Anxiety, Mental Health Reviewed, Depression, Grief and Loss, Coping Strategies,  Crisis, Other, Suicide, Substance Abuse  [Assessed Mental Health & Cognitive Status.]  Nutrition Interventions   Nutrition Discussed/Reviewed Nutrition Discussed, Adding fruits and vegetables, Fluid intake, Increasing proteins, Decreasing fats, Nutrition Reviewed, Carbohydrate meal planning, Portion sizes, Decreasing salt, Decreasing sugar intake  [Encouraged Diabetic-Friendly, Reduced Fat, Low Sodium  Diet.]  Pharmacy Interventions   Pharmacy Dicussed/Reviewed Pharmacy Topics Discussed, Medications and their functions, Pharmacy Topics Reviewed, Medication Adherence, Affording Medications  [Confirmed Ability to Afford Prescription Medications.]  Medication Adherence --  [Confirmed Compliance with Prescription Medications.]  Safety Interventions   Safety Discussed/Reviewed Safety Discussed, Safety Reviewed  [Encouraged Routine Use of Assistive Devices & Durable Medical Equipment.]  Home Safety Assistive Devices, Need for home safety assessment  [Encouraged Consideration of Home Safety Evaluation.]  Advanced Directive Interventions   Advanced Directives Discussed/Reviewed Advanced Directives Discussed, Advanced Directives Reviewed  [Confirmed Advanced Directives (Living Will & Healthcare Power of Attorney Documents) Initiated. Copies Requested to Scan into Electronic Medical Record in Epic.]      Active Listening & Reflection Utilized.  Verbalization of Feelings Encouraged.  Emotional Support Provided.   Acceptance & Commitment Therapy Indicated. Cognitive Behavioral Therapy Conducted. Client-Centered Therapy Initiated.  Encouraged Routine Engagement with Kemonte Ullman, Licensed Clinical Social Worker with [facility], [facility] [phone]), if You Have Questions, Need Assistance, or If Additional Social Work Needs Are Identified Between Now & Our Next Follow-Up Outreach Call, Scheduled on 02/13/2024 at 9:00 AM. Encouraged Routine Engagement with Dr.  Daphine Eagle, Psychiatrist with [facility] [phone]), to Receive Psychotropic Medication Administration & Management, in An Effort to Reduce & Manage Symptoms of Anxiety, Depression & Panic Attacks, & During Follow-Up Appointment, Scheduled on 02/14/2024 at 3:00 PM.      Our next appointment is by telephone on 02/14/2024 at 3:00 pm.  Please call the care guide team at [phone] if you need to cancel or reschedule your appointment.   If you are experiencing a Mental Health or Behavioral Health Crisis or need someone to talk to, please call the Suicide and Crisis Lifeline: 988 call the [place]  National Suicide Prevention Lifeline: 234 572 3165 or TTY: 843 307 4890 TTY 716-830-3936) to talk to a trained counselor call 1-800-273-TALK (toll free, 24 hour hotline) go to [facility] [address], [place] [phone]) call the [facility] Crisis Line: [phone] call 911  Patient verbalizes understanding of instructions and care plan provided today and agrees to view in MyChart. Active MyChart status and patient understanding of how to access instructions and care plan via MyChart confirmed with patient.     Telephone follow up appointment with care management team member scheduled for:  02/14/2024 at 3:00 pm.   Oriel Rumbold, BSW, MSW, LCSW [place]  [facility], [facility] Clinical Social Worker II Direct Dial: [phone]  Fax: [phone] Website: [email or web address]

## 2024-01-26 NOTE — Patient Outreach (Signed)
 Care Coordination   Follow Up Visit Note   01/26/2024  Name: Marvin Espinoza MRN: [id number] DOB: [birth year]  Marvin Espinoza is a 53 y.o. year old male who sees Marvin Guerin, DO for primary care. I spoke with Marvin Espinoza by phone today.  What matters to the patients health and wellness today?  Reduce & Manage Symptoms of Anxiety & Depression.    Goals Addressed               This Visit's Progress     Reduce & Manage Symptoms of Anxiety & Depression. (pt-stated)   On track     Care Coordination Interventions:  Interventions Today    Flowsheet Row Most Recent Value  Chronic Disease   Chronic disease during today's visit Diabetes, Other  [Adjustment Disorder w/ Anxious Mood, Adjustment Insomnia, Bipolar Affective Psychosis, Drug Overdose, Generalized Anxiety Disorder, History of Concussion, Recurrent Severe Major Depressive Disorder, Mood Disorder, PTSD, Neuropathy, Tardive Dyskinesia.]  General Interventions   General Interventions Discussed/Reviewed General Interventions Discussed, Labs, Vaccines, Doctor Visits, Health Screening, Annual Foot Exam, General Interventions Reviewed, Lipid Profile, Annual Eye Exam, Durable Medical Equipment (DME), Community Resources, Level of Care, Communication with  [Encouraged Routine Engagement with Care Team Members & Providers.]  Labs Hgb A1c every 3 months, Kidney Function, Hgb A1c annually  [Reviewed & Encouraged Daily Monitoring & Logging Results.]  Vaccines COVID-19, Flu, Pneumonia, RSV, Shingles, Tetanus/Pertussis/Diphtheria  [Encouraged Routine Vaccinations.]  Doctor Visits Discussed/Reviewed Doctor Visits Discussed, Specialist, Annual Wellness Visits, Doctor Visits Reviewed, PCP  [Encouraged Routine Engagement with Care Team Members & Providers.]  Health Screening Bone Density, Colonoscopy, Prostate  [Encouraged Routine Health Screenings.]  Durable Medical Equipment (DME) Glucomoter, BP Cuff, Other  [Prescription Eyeglasses,  Hand-Held Shower Hose, Engineer, materials in [place], [place].]  PCP/Specialist Visits Compliance with follow-up visit  [Encouraged Routine Engagement with Care Team Members & Providers.]  Communication with PCP/Specialists, Charity fundraiser, Pharmacists, Social Work  [employer] Routine Engagement with Care Team Members & Providers.]  Level of Care Adult Daycare, Air traffic controller, Assisted Living, Skilled Nursing Facility  [Confirmed Disinterest in Enrollment in Adult Day Care Program or Receiving Assistance Pursuing Higher Level of Care Placement Options (I.e Assisted Living Versus Skilled Nursing Facility).]  Applications Medicaid, Personal Care Services  [Confirmed Disinterest in Applying for Medicaid or Personal Care Services.]  Exercise Interventions   Exercise Discussed/Reviewed Exercise Discussed, Assistive device use and maintanence, Exercise Reviewed, Physical Activity, Weight Managment  [Encouraged Daily Exercise Regimen, As Tolerated.]  Physical Activity Discussed/Reviewed Physical Activity Discussed, Home Exercise Program (HEP), Physical Activity Reviewed, PREP, Gym, Types of exercise  [Encouraged Increased Engagement in Activities of Interest, Inside & Outside the Home.]  Weight Management Weight loss  [Encouraged Healthy Weight Loss Regimen.]  Education Interventions   Education Provided Provided Therapist, sports, Provided Education, Provided Web-based Education  [employer] Reviewed Educational Material to [employer] & Entertain Questions.]  Provided Verbal Education On Nutrition, Mental Health/Coping with Illness, When to see the doctor, Foot Care, Eye Care, Blood Sugar Monitoring, Labs, Applications, Exercise, Medication, [employer], [employer], Sick Day Rules  [Encouraged Continued Independent Review of Educational Material Provided.]  Labs Reviewed Hgb A1c  [Reviewed.]  Ship broker, Personal Care Services  [Confirmed Disinterest in Applying for [employer] or Personal Care  Services.]  Mental Health Interventions   Mental Health Discussed/Reviewed Mental Health Discussed, Anxiety, Mental Health Reviewed, Depression, Grief and Loss, Coping Strategies, Crisis, Other, Suicide, Substance Abuse  [Assessed Mental Health & Cognitive Status.]  Nutrition Interventions   Nutrition Discussed/Reviewed Nutrition  Discussed, Adding fruits and vegetables, Fluid intake, Increasing proteins, Decreasing fats, Nutrition Reviewed, Carbohydrate meal planning, Portion sizes, Decreasing salt, Decreasing sugar intake  [Encouraged Diabetic-Friendly, Reduced Fat, Low Sodium Diet.]  Pharmacy Interventions   Pharmacy Dicussed/Reviewed Pharmacy Topics Discussed, Medications and their functions, Pharmacy Topics Reviewed, Medication Adherence, Affording Medications  [Confirmed Ability to Afford Prescription Medications.]  Medication Adherence --  [Confirmed Compliance with Prescription Medications.]  Safety Interventions   Safety Discussed/Reviewed Safety Discussed, Safety Reviewed  [Encouraged Routine Use of Assistive Devices & Durable Medical Equipment.]  Home Safety Assistive Devices, Need for home safety assessment  [Encouraged Consideration of Home Safety Evaluation.]  Advanced Directive Interventions   Advanced Directives Discussed/Reviewed Advanced Directives Discussed, Advanced Directives Reviewed  Engineer, building services (Living Will & Healthcare Power of Attorney Documents) Initiated. Copies Requested to Scan into Electronic Medical Record in Epic.]      Active Listening & Reflection Utilized.  Verbalization of Feelings Encouraged.  Emotional Support Provided.   Acceptance & Commitment Therapy Indicated. Cognitive Behavioral Therapy Conducted. Client-Centered Therapy Initiated.  Encouraged Routine Engagement with Marvin Espinoza, Licensed Clinical Social Worker with [facility], [facility] [phone]), if You Have Questions, Need  Assistance, or If Additional Social Work Needs Are Identified Between Now & Our Next Follow-Up Outreach Call, Scheduled on 02/13/2024 at 9:00 AM. Encouraged Routine Engagement with Dr. Daphine Eagle, Psychiatrist with [facility] [phone]), to Receive Psychotropic Medication Administration & Management, in An Effort to Reduce & Manage Symptoms of Anxiety, Depression & Panic Attacks, & During Follow-Up Appointment, Scheduled on 02/14/2024 at 3:00 PM.      SDOH assessments and interventions completed:  Yes.  Care Coordination Interventions:  Yes, provided.   Follow up plan: Follow up call scheduled for 02/14/2024 at 3:00 pm.  Encounter Outcome:  Patient Visit Completed.    Timmie Footman, BSW, MSW, LCSW [facility], [facility] Clinical Social Worker II Direct Dial: [phone]  Fax: [phone] Website: [email or web address]

## 2024-02-12 ENCOUNTER — Other Ambulatory Visit: Payer: Self-pay | Admitting: Family Medicine

## 2024-02-13 ENCOUNTER — Ambulatory Visit: Payer: Self-pay | Admitting: *Deleted

## 2024-02-13 NOTE — Patient Outreach (Signed)
 Care Coordination   Follow Up Visit Note   02/13/2024  Name: Uday Jantz MRN: [id number] DOB: 09-19-1971  Richey Doolittle is a 53 y.o. year old male who sees Raliegh Ip, DO for primary care. I spoke with [name] by phone today.  What matters to the patients health and wellness today?  Reduce & Manage Symptoms of Anxiety & Depression.    Goals Addressed               This Visit's Progress     Reduce & Manage Symptoms of Anxiety & Depression. (pt-stated)   On track     Care Coordination Interventions:  Interventions Today    Flowsheet Row Most Recent Value  Chronic Disease   Chronic disease during today's visit Diabetes, Other  [Adjustment Disorder w/ Anxious Mood, Adjustment Insomnia, Bipolar Affective Psychosis, Drug Overdose, Generalized Anxiety Disorder, History of Concussion, Recurrent Severe Major Depressive Disorder, Mood Disorder, PTSD, Neuropathy, Tardive Dyskinesia.]  General Interventions   General Interventions Discussed/Reviewed General Interventions Discussed, Labs, Vaccines, Doctor Visits, Health Screening, Annual Foot Exam, General Interventions Reviewed, Lipid Profile, Annual Eye Exam, Durable Medical Equipment (DME), Community Resources, Level of Care, Communication with  [Encouraged Routine Engagement with Care Team Members & Providers.]  Labs Hgb A1c every 3 months, Kidney Function, Hgb A1c annually  [Reviewed & Encouraged Daily Monitoring & Logging Results.]  Vaccines COVID-19, Flu, Pneumonia, RSV, Shingles, Tetanus/Pertussis/Diphtheria  [Encouraged Routine Vaccinations.]  Doctor Visits Discussed/Reviewed Doctor Visits Discussed, Specialist, Annual Wellness Visits, Doctor Visits Reviewed, PCP  [Encouraged Routine Engagement with Care Team Members & Providers.]  Health Screening Bone Density, Colonoscopy, Prostate  [Encouraged Routine Health Screenings.]  Durable Medical Equipment (DME) Glucomoter, BP Cuff, Other  [Prescription Eyeglasses, Hand-Held  Shower Hose, Engineer, materials in [place], [place].]  PCP/Specialist Visits Compliance with follow-up visit  [Encouraged Routine Engagement with Care Team Members & Providers.]  Communication with PCP/Specialists, Charity fundraiser, Pharmacists, Social Work  [employer] Routine Engagement with Care Team Members & Providers.]  Level of Care Adult Daycare, Air traffic controller, Assisted Living, Skilled Nursing Facility  [Confirmed Disinterest in Enrollment in Adult Day Care Program or Receiving Assistance Pursuing Higher Level of Care Placement Options (I.e Assisted Living Versus Skilled Nursing Facility).]  Applications Medicaid, Personal Care Services  [Confirmed Disinterest in Applying for Medicaid or Personal Care Services.]  Exercise Interventions   Exercise Discussed/Reviewed Exercise Discussed, Assistive device use and maintanence, Exercise Reviewed, Physical Activity, Weight Managment  [Encouraged Daily Exercise Regimen, As Tolerated.]  Physical Activity Discussed/Reviewed Physical Activity Discussed, Home Exercise Program (HEP), Physical Activity Reviewed, PREP, Gym, Types of exercise  [Encouraged Increased Engagement in Activities of Interest, Inside & Outside the Home.]  Weight Management Weight loss  [Encouraged Healthy Weight Loss Regimen.]  Education Interventions   Education Provided Provided Therapist, sports, Provided Education, Provided Web-based Education  [employer] Reviewed Educational Material to [employer] Questions.]  Provided Verbal Education On Nutrition, Mental Health/Coping with Illness, When to see the doctor, Foot Care, Eye Care, Blood Sugar Monitoring, Labs, Applications, Exercise, Medication, [employer], [employer], Sick Day Rules  [Encouraged Continued Independent Review of Educational Material Provided.]  Labs Reviewed Hgb A1c  [Reviewed.]  Ship broker, Personal Care Services  [Confirmed Disinterest in Applying for [employer] or Personal Care Services.]  Mental  Health Interventions   Mental Health Discussed/Reviewed Mental Health Discussed, Anxiety, Mental Health Reviewed, Depression, Grief and Loss, Coping Strategies, Crisis, Other, Suicide, Substance Abuse  [Assessed Mental Health & Cognitive Status.]  Nutrition Interventions   Nutrition Discussed/Reviewed Nutrition  Discussed, Adding fruits and vegetables, Fluid intake, Increasing proteins, Decreasing fats, Nutrition Reviewed, Carbohydrate meal planning, Portion sizes, Decreasing salt, Decreasing sugar intake  [Encouraged Diabetic-Friendly, Reduced Fat, Low Sodium Diet.]  Pharmacy Interventions   Pharmacy Dicussed/Reviewed Pharmacy Topics Discussed, Medications and their functions, Pharmacy Topics Reviewed, Medication Adherence, Affording Medications  [Confirmed Ability to Afford Prescription Medications.]  Medication Adherence --  [Confirmed Compliance with Prescription Medications.]  Safety Interventions   Safety Discussed/Reviewed Safety Discussed, Safety Reviewed  [Encouraged Routine Use of Assistive Devices & Durable Medical Equipment.]  Home Safety Assistive Devices, Need for home safety assessment  [Encouraged Consideration of Home Safety Evaluation.]  Advanced Directive Interventions   Advanced Directives Discussed/Reviewed Advanced Directives Discussed, Advanced Directives Reviewed  Engineer, building services (Living Will & Healthcare Power of Attorney Documents) Initiated. Copies Requested to Scan into Electronic Medical Record in Epic.]      Active Listening & Reflection Utilized.  Verbalization of Feelings Encouraged.  Emotional Support Provided.   Acceptance & Commitment Therapy Indicated. Cognitive Behavioral Therapy Conducted. Client-Centered Therapy Initiated.  Encouraged Routine Engagement with Dr. Milagros Evener, Psychiatrist with [facility] [phone]), to Receive Psychotropic Medication Administration & Management, in An Effort to Reduce & Manage Symptoms  of Anxiety, Depression, & Panic Attacks, & During Follow-Up Appointment, Scheduled on 02/14/2024 at 3:00 PM. Encouraged Routine Engagement with Danford Bad, Licensed Clinical Social Worker with [facility], [facility] [phone]), if You Have Questions, Need Assistance, or If Additional Social Work Needs Are Identified Between Now & Our Next Follow-Up Outreach Call, Scheduled on 02/16/2024 at 2:30 PM.      SDOH assessments and interventions completed:  Yes.  Care Coordination Interventions:  Yes, provided.   Follow up plan: Follow up call scheduled for 02/16/2024 at 2:30 pm.  Encounter Outcome:  Patient Visit Completed.   Danford Bad, BSW, MSW, LCSW [facility], [facility] Clinical Social Worker II Direct Dial: [phone]  Fax: [phone] Website: [email or web address]

## 2024-02-13 NOTE — Patient Instructions (Signed)
 Visit Information  Thank you for taking time to visit with me today. Please don't hesitate to contact me if I can be of assistance to you.   Following are the goals we discussed today:   Goals Addressed               This Visit's Progress     Reduce & Manage Symptoms of Anxiety & Depression. (pt-stated)   On track     Care Coordination Interventions:  Interventions Today    Flowsheet Row Most Recent Value  Chronic Disease   Chronic disease during today's visit Diabetes, Other  [Adjustment Disorder w/ Anxious Mood, Adjustment Insomnia, Bipolar Affective Psychosis, Drug Overdose, Generalized Anxiety Disorder, History of Concussion, Recurrent Severe Major Depressive Disorder, Mood Disorder, PTSD, Neuropathy, Tardive Dyskinesia.]  General Interventions   General Interventions Discussed/Reviewed General Interventions Discussed, Labs, Vaccines, Doctor Visits, Health Screening, Annual Foot Exam, General Interventions Reviewed, Lipid Profile, Annual Eye Exam, Durable Medical Equipment (DME), Community Resources, Level of Care, Communication with  [Encouraged Routine Engagement with Care Team Members & Providers.]  Labs Hgb A1c every 3 months, Kidney Function, Hgb A1c annually  [Reviewed & Encouraged Daily Monitoring & Logging Results.]  Vaccines COVID-19, Flu, Pneumonia, RSV, Shingles, Tetanus/Pertussis/Diphtheria  [Encouraged Routine Vaccinations.]  Doctor Visits Discussed/Reviewed Doctor Visits Discussed, Specialist, Annual Wellness Visits, Doctor Visits Reviewed, PCP  [Encouraged Routine Engagement with Care Team Members & Providers.]  Health Screening Bone Density, Colonoscopy, Prostate  [Encouraged Routine Health Screenings.]  Durable Medical Equipment (DME) Glucomoter, BP Cuff, Other  [Prescription Eyeglasses, Hand-Held Shower Hose, Engineer, materials in [place], [place].]  PCP/Specialist Visits Compliance with follow-up visit  [Encouraged Routine Engagement with Care Team Members & Providers.]   Communication with PCP/Specialists, Charity fundraiser, Pharmacists, Social Work  [employer] Routine Engagement with Care Team Members & Providers.]  Level of Care Adult Daycare, Air traffic controller, Assisted Living, Skilled Nursing Facility  [Confirmed Disinterest in Enrollment in Adult Day Care Program or Receiving Assistance Pursuing Higher Level of Care Placement Options (I.e Assisted Living Versus Skilled Nursing Facility).]  Applications Medicaid, Personal Care Services  [Confirmed Disinterest in Applying for Medicaid or Personal Care Services.]  Exercise Interventions   Exercise Discussed/Reviewed Exercise Discussed, Assistive device use and maintanence, Exercise Reviewed, Physical Activity, Weight Managment  [Encouraged Daily Exercise Regimen, As Tolerated.]  Physical Activity Discussed/Reviewed Physical Activity Discussed, Home Exercise Program (HEP), Physical Activity Reviewed, PREP, Gym, Types of exercise  [Encouraged Increased Engagement in Activities of Interest, Inside & Outside the Home.]  Weight Management Weight loss  [Encouraged Healthy Weight Loss Regimen.]  Education Interventions   Education Provided Provided Therapist, sports, Provided Education, Provided Web-based Education  [employer] Reviewed Educational Material to [employer] Questions.]  Provided Verbal Education On Nutrition, Mental Health/Coping with Illness, When to see the doctor, Foot Care, Eye Care, Blood Sugar Monitoring, Labs, Applications, Exercise, Medication, [employer], Development worker, community, Sick Day Rules  [Encouraged Continued Independent Review of Educational Material Provided.]  Labs Reviewed Hgb A1c  [Reviewed.]  Ship broker, Personal Care Services  [Confirmed Disinterest in Applying for [employer] or Personal Care Services.]  Mental Health Interventions   Mental Health Discussed/Reviewed Mental Health Discussed, Anxiety, Mental Health Reviewed, Depression, Grief and Loss, Coping Strategies,  Crisis, Other, Suicide, Substance Abuse  [Assessed Mental Health & Cognitive Status.]  Nutrition Interventions   Nutrition Discussed/Reviewed Nutrition Discussed, Adding fruits and vegetables, Fluid intake, Increasing proteins, Decreasing fats, Nutrition Reviewed, Carbohydrate meal planning, Portion sizes, Decreasing salt, Decreasing sugar intake  [Encouraged Diabetic-Friendly, Reduced Fat, Low Sodium  Diet.]  Pharmacy Interventions   Pharmacy Dicussed/Reviewed Pharmacy Topics Discussed, Medications and their functions, Pharmacy Topics Reviewed, Medication Adherence, Affording Medications  [Confirmed Ability to Afford Prescription Medications.]  Medication Adherence --  [Confirmed Compliance with Prescription Medications.]  Safety Interventions   Safety Discussed/Reviewed Safety Discussed, Safety Reviewed  [Encouraged Routine Use of Assistive Devices & Durable Medical Equipment.]  Home Safety Assistive Devices, Need for home safety assessment  [Encouraged Consideration of Home Safety Evaluation.]  Advanced Directive Interventions   Advanced Directives Discussed/Reviewed Advanced Directives Discussed, Advanced Directives Reviewed  [Confirmed Advanced Directives (Living Will & Healthcare Power of Attorney Documents) Initiated. Copies Requested to Scan into Electronic Medical Record in Epic.]      Active Listening & Reflection Utilized.  Verbalization of Feelings Encouraged.  Emotional Support Provided.   Acceptance & Commitment Therapy Indicated. Cognitive Behavioral Therapy Conducted. Client-Centered Therapy Initiated.  Encouraged Routine Engagement with Dr. Milagros Evener, Psychiatrist with [facility] [phone]), to Receive Psychotropic Medication Administration & Management, in An Effort to Reduce & Manage Symptoms of Anxiety, Depression, & Panic Attacks, & During Follow-Up Appointment, Scheduled on 02/14/2024 at 3:00 PM. Encouraged Routine Engagement with Danford Bad,  Licensed Clinical Social Worker with [facility], [facility] [phone]), if You Have Questions, Need Assistance, or If Additional Social Work Needs Are Identified Between Now & Our Next Follow-Up Outreach Call, Scheduled on 02/16/2024 at 2:30 PM.      Our next appointment is by telephone on 02/16/2024 at 2:30 pm.  Please call the care guide team at [phone] if you need to cancel or reschedule your appointment.   If you are experiencing a Mental Health or Behavioral Health Crisis or need someone to talk to, please call the Suicide and Crisis Lifeline: 988 call the [place] National Suicide Prevention Lifeline: (952) 089-5452 or TTY: (979)574-3060 TTY (778) 192-8105) to talk to a trained counselor call 1-800-273-TALK (toll free, 24 hour hotline) go to [facility] [address], [place] [phone]) call the [facility] Crisis Line: (539)199-7257 call 911  Patient verbalizes understanding of instructions and care plan provided today and agrees to view in MyChart. Active MyChart status and patient understanding of how to access instructions and care plan via MyChart confirmed with patient.     Telephone follow up appointment with care management team member scheduled for:  02/16/2024 at 2:30 pm.  Danford Bad, BSW, MSW, LCSW [facility], [facility] Clinical Social Worker II Direct Dial: [phone]  Fax: [phone] Website: [email or web address]

## 2024-02-16 ENCOUNTER — Ambulatory Visit: Payer: Self-pay | Admitting: *Deleted

## 2024-02-16 ENCOUNTER — Other Ambulatory Visit: Payer: 59

## 2024-02-16 DIAGNOSIS — E039 Hypothyroidism, unspecified: Secondary | ICD-10-CM

## 2024-02-16 NOTE — Patient Outreach (Signed)
 Care Coordination   Follow Up Visit Note   02/16/2024  Name: Marvin Espinoza MRN: [id number] DOB: [birth year]  Marvin Espinoza is a 53 y.o. year old male who sees Marvin Ip, DO for primary care. I spoke with Marvin Espinoza by phone today.  What matters to the patients health and wellness today?  Reduce & Manage Symptoms of Anxiety & Depression.    Goals Addressed               This Visit's Progress     COMPLETED: Reduce & Manage Symptoms of Anxiety & Depression. (pt-stated)   On track     Care Coordination Interventions:  Interventions Today    Flowsheet Row Most Recent Value  Chronic Disease   Chronic disease during today's visit Diabetes, Other  [Adjustment Disorder w/ Anxious Mood, Adjustment Insomnia, Bipolar Affective Psychosis, Drug Overdose, Generalized Anxiety Disorder, History of Concussion, Recurrent Severe Major Depressive Disorder, Mood Disorder, PTSD, Neuropathy, Tardive Dyskinesia.]  General Interventions   General Interventions Discussed/Reviewed General Interventions Discussed, Labs, Vaccines, Doctor Visits, Health Screening, Annual Foot Exam, General Interventions Reviewed, Lipid Profile, Annual Eye Exam, Durable Medical Equipment (DME), Community Resources, Level of Care, Communication with  [Marvin Espinoza with Care Team Members & Providers.]  Labs Hgb A1c every 3 months, Kidney Function, Hgb A1c annually  [Reviewed & Marvin Daily Monitoring & Logging Results.]  Vaccines COVID-19, Flu, Pneumonia, RSV, Shingles, Tetanus/Pertussis/Diphtheria  [Marvin Routine Vaccinations.]  Doctor Visits Discussed/Reviewed Doctor Visits Discussed, Specialist, Annual Wellness Visits, Doctor Visits Reviewed, PCP  [Marvin Espinoza with Care Team Members & Providers.]  Health Screening Bone Density, Colonoscopy, Prostate  [Marvin Routine Health Screenings.]  Durable Medical Equipment (DME) Glucomoter, BP Cuff, Other  [Prescription Eyeglasses,  Hand-Held Shower Hose, Engineer, materials in [place], [place].]  PCP/Specialist Visits Compliance with follow-up visit  [Marvin Espinoza with Care Team Members & Providers.]  Communication with PCP/Specialists, Charity fundraiser, Pharmacists, Social Work  [employer] Routine Espinoza with Care Team Members & Providers.]  Level of Care Adult Daycare, Air traffic controller, Assisted Living, Skilled Nursing Facility  [Confirmed Disinterest in Enrollment in Adult Day Care Program or Receiving Assistance Pursuing Higher Level of Care Placement Options (I.e Assisted Living Versus Skilled Nursing Facility).]  Applications Medicaid, Personal Care Services  [Confirmed Disinterest in Applying for Medicaid or Personal Care Services.]  Exercise Interventions   Exercise Discussed/Reviewed Exercise Discussed, Assistive device use and maintanence, Exercise Reviewed, Physical Activity, Weight Managment  [Marvin Daily Exercise Regimen, As Tolerated.]  Physical Activity Discussed/Reviewed Physical Activity Discussed, Home Exercise Program (HEP), Physical Activity Reviewed, PREP, Gym, Types of exercise  [Marvin Increased Espinoza in Activities of Interest, Inside & Outside the Home.]  Weight Management Weight loss  [Marvin Healthy Weight Loss Regimen.]  Education Interventions   Education Provided Provided Therapist, sports, Provided Education, Provided Web-based Education  [employer] Reviewed Educational Material to [employer] & Entertain Questions.]  Provided Verbal Education On Nutrition, Mental Health/Coping with Illness, When to see the doctor, Foot Care, Eye Care, Blood Sugar Monitoring, Labs, Applications, Exercise, Medication, [employer], [employer], Sick Day Rules  [Marvin Continued Independent Review of Educational Material Provided.]  Labs Reviewed Hgb A1c  [Reviewed.]  Ship broker, Personal Care Services  [Confirmed Disinterest in Applying for [employer] or Personal Care  Services.]  Mental Health Interventions   Mental Health Discussed/Reviewed Mental Health Discussed, Anxiety, Mental Health Reviewed, Depression, Grief and Loss, Coping Strategies, Crisis, Other, Suicide, Substance Abuse  [Assessed Mental Health & Cognitive Status.]  Nutrition Interventions   Nutrition Discussed/Reviewed  Nutrition Discussed, Adding fruits and vegetables, Fluid intake, Increasing proteins, Decreasing fats, Nutrition Reviewed, Carbohydrate meal planning, Portion sizes, Decreasing salt, Decreasing sugar intake  [Marvin Diabetic-Friendly, Reduced Fat, Low Sodium Diet.]  Pharmacy Interventions   Pharmacy Dicussed/Reviewed Pharmacy Topics Discussed, Medications and their functions, Pharmacy Topics Reviewed, Medication Adherence, Affording Medications  [Confirmed Ability to Afford Prescription Medications.]  Medication Adherence --  [Confirmed Compliance with Prescription Medications.]  Safety Interventions   Safety Discussed/Reviewed Safety Discussed, Safety Reviewed  [Marvin Routine Use of Assistive Devices & Durable Medical Equipment.]  Home Safety Assistive Devices, Need for home safety assessment  [Marvin Consideration of Home Safety Evaluation.]  Advanced Directive Interventions   Advanced Directives Discussed/Reviewed Advanced Directives Discussed, Advanced Directives Reviewed  [Confirmed Advanced Directives (Living Will & Healthcare Power of Attorney Documents) Initiated. Copies Requested to Scan into Electronic Medical Record in Epic.]      Active Listening & Reflection Utilized.  Verbalization of Feelings Marvin.  Emotional Support Provided.   Feelings of Frustration Validated. Acceptance & Commitment Therapy Initiated. Cognitive Behavioral Therapy Performed. Client-Centered Therapy Implemented.  Marvin Review of Reputable Psychologists & Therapists in [place], [place], from List Provided, & Marvin Careers information officer with Psychologists & Therapists of  Interest, in An Effort to Establish Counseling & Supportive Services to Reduce & Manage Symptoms of Anxiety, Depression, & Panic Attacks. Marvin Espinoza with Dr. Milagros Evener, Psychiatrist with [facility] [phone]), to Receive Psychotropic Medication Administration & Management, in An Effort to Reduce & Manage Symptoms of Anxiety, Depression, & Panic Attacks. Marvin Espinoza with Danford Bad, Licensed Clinical Social Worker with [facility], [facility] [phone]), if You Have Questions, Need Assistance, Additional Social Work Needs Are Identified in The Near Future, or If You Change Your Mind About Wanting to Receive Social Work Services.       SDOH assessments and interventions completed:  Yes.  Care Coordination Interventions:  Yes, provided.   Follow up plan: No further intervention required.   Encounter Outcome:  Patient Visit Completed.   Danford Bad, BSW, MSW, LCSW [facility], [facility] Clinical Social Worker II Direct Dial: [phone]  Fax: [phone] Website: [email or web address]

## 2024-02-16 NOTE — Patient Instructions (Signed)
 Visit Information  Thank you for taking time to visit with me today. Please don't hesitate to contact me if I can be of assistance to you.   Following are the goals we discussed today:   Goals Addressed               This Visit's Progress     COMPLETED: Reduce & Manage Symptoms of Anxiety & Depression. (pt-stated)   On track     Care Coordination Interventions:  Interventions Today    Flowsheet Row Most Recent Value  Chronic Disease   Chronic disease during today's visit Diabetes, Other  [Adjustment Disorder w/ Anxious Mood, Adjustment Insomnia, Bipolar Affective Psychosis, Drug Overdose, Generalized Anxiety Disorder, History of Concussion, Recurrent Severe Major Depressive Disorder, Mood Disorder, PTSD, Neuropathy, Tardive Dyskinesia.]  General Interventions   General Interventions Discussed/Reviewed General Interventions Discussed, Labs, Vaccines, Doctor Visits, Health Screening, Annual Foot Exam, General Interventions Reviewed, Lipid Profile, Annual Eye Exam, Durable Medical Equipment (DME), Community Resources, Level of Care, Communication with  [Encouraged Routine Engagement with Care Team Members & Providers.]  Labs Hgb A1c every 3 months, Kidney Function, Hgb A1c annually  [Reviewed & Encouraged Daily Monitoring & Logging Results.]  Vaccines COVID-19, Flu, Pneumonia, RSV, Shingles, Tetanus/Pertussis/Diphtheria  [Encouraged Routine Vaccinations.]  Doctor Visits Discussed/Reviewed Doctor Visits Discussed, Specialist, Annual Wellness Visits, Doctor Visits Reviewed, PCP  [Encouraged Routine Engagement with Care Team Members & Providers.]  Health Screening Bone Density, Colonoscopy, Prostate  [Encouraged Routine Health Screenings.]  Durable Medical Equipment (DME) Glucomoter, BP Cuff, Other  [Prescription Eyeglasses, Hand-Held Shower Hose, Engineer, materials in [place], [place].]  PCP/Specialist Visits Compliance with follow-up visit  [Encouraged Routine Engagement with Care Team Members &  Providers.]  Communication with PCP/Specialists, Charity fundraiser, Pharmacists, Social Work  [employer] Routine Engagement with Care Team Members & Providers.]  Level of Care Adult Daycare, Air traffic controller, Assisted Living, Skilled Nursing Facility  [Confirmed Disinterest in Enrollment in Adult Day Care Program or Receiving Assistance Pursuing Higher Level of Care Placement Options (I.e Assisted Living Versus Skilled Nursing Facility).]  Applications Medicaid, Personal Care Services  [Confirmed Disinterest in Applying for Medicaid or Personal Care Services.]  Exercise Interventions   Exercise Discussed/Reviewed Exercise Discussed, Assistive device use and maintanence, Exercise Reviewed, Physical Activity, Weight Managment  [Encouraged Daily Exercise Regimen, As Tolerated.]  Physical Activity Discussed/Reviewed Physical Activity Discussed, Home Exercise Program (HEP), Physical Activity Reviewed, PREP, Gym, Types of exercise  [Encouraged Increased Engagement in Activities of Interest, Inside & Outside the Home.]  Weight Management Weight loss  [Encouraged Healthy Weight Loss Regimen.]  Education Interventions   Education Provided Provided Therapist, sports, Provided Education, Provided Web-based Education  [employer] Reviewed Educational Material to [employer] Questions.]  Provided Verbal Education On Nutrition, Mental Health/Coping with Illness, When to see the doctor, Foot Care, Eye Care, Blood Sugar Monitoring, Labs, Applications, Exercise, Medication, [employer], Development worker, community, Sick Day Rules  [Encouraged Continued Independent Review of Educational Material Provided.]  Labs Reviewed Hgb A1c  [Reviewed.]  Ship broker, Personal Care Services  [Confirmed Disinterest in Applying for [employer] or Personal Care Services.]  Mental Health Interventions   Mental Health Discussed/Reviewed Mental Health Discussed, Anxiety, Mental Health Reviewed, Depression, Grief and Loss, Coping  Strategies, Crisis, Other, Suicide, Substance Abuse  [Assessed Mental Health & Cognitive Status.]  Nutrition Interventions   Nutrition Discussed/Reviewed Nutrition Discussed, Adding fruits and vegetables, Fluid intake, Increasing proteins, Decreasing fats, Nutrition Reviewed, Carbohydrate meal planning, Portion sizes, Decreasing salt, Decreasing sugar intake  [Encouraged Diabetic-Friendly, Reduced Fat, Low  Sodium Diet.]  Pharmacy Interventions   Pharmacy Dicussed/Reviewed Pharmacy Topics Discussed, Medications and their functions, Pharmacy Topics Reviewed, Medication Adherence, Affording Medications  [Confirmed Ability to Afford Prescription Medications.]  Medication Adherence --  [Confirmed Compliance with Prescription Medications.]  Safety Interventions   Safety Discussed/Reviewed Safety Discussed, Safety Reviewed  [Encouraged Routine Use of Assistive Devices & Durable Medical Equipment.]  Home Safety Assistive Devices, Need for home safety assessment  [Encouraged Consideration of Home Safety Evaluation.]  Advanced Directive Interventions   Advanced Directives Discussed/Reviewed Advanced Directives Discussed, Advanced Directives Reviewed  [Confirmed Advanced Directives (Living Will & Healthcare Power of Attorney Documents) Initiated. Copies Requested to Scan into Electronic Medical Record in Epic.]      Active Listening & Reflection Utilized.  Verbalization of Feelings Encouraged.  Emotional Support Provided.   Feelings of Frustration Validated. Acceptance & Commitment Therapy Initiated. Cognitive Behavioral Therapy Performed. Client-Centered Therapy Implemented.  Encouraged Review of Reputable Psychologists & Therapists in [place], [place], from List Provided, & Encouraged Careers information officer with Psychologists & Therapists of Interest, in An Effort to Establish Counseling & Supportive Services to Reduce & Manage Symptoms of Anxiety, Depression, & Panic Attacks. Encouraged Routine Engagement with  Dr. Milagros Evener, Psychiatrist with [facility] [phone]), to Receive Psychotropic Medication Administration & Management, in An Effort to Reduce & Manage Symptoms of Anxiety, Depression, & Panic Attacks. Encouraged Engagement with Danford Bad, Licensed Clinical Social Worker with [facility], [facility] [phone]), if You Have Questions, Need Assistance, Additional Social Work Needs Are Identified in The Near Future, or If You Change Your Mind About Wanting to Receive Social Work Services.       Please call the care guide team at [phone] if you need to cancel or reschedule your appointment.   If you are experiencing a Mental Health or Behavioral Health Crisis or need someone to talk to, please call the Suicide and Crisis Lifeline: 988 call the [place] National Suicide Prevention Lifeline: 223-549-5721 or TTY: 902-398-0578 TTY 217-233-1615) to talk to a trained counselor call 1-800-273-TALK (toll free, 24 hour hotline) go to [facility] [address] [place] [phone]) call the [facility] Crisis Line: 202-085-6403 call 911  Patient verbalizes understanding of instructions and care plan provided today and agrees to view in MyChart. Active MyChart status and patient understanding of how to access instructions and care plan via MyChart confirmed with patient.     No further follow up required.   Danford Bad, BSW, MSW, LCSW [facility], [facility] Clinical Social Worker II Direct Dial: [phone]  Fax: [phone] Website: [email or web address]

## 2024-02-17 ENCOUNTER — Encounter: Payer: Self-pay | Admitting: Family Medicine

## 2024-02-17 LAB — TSH+FREE T4
Free T4: 1.22 ng/dL (ref 0.82–1.77)
TSH: 0.649 u[IU]/mL (ref 0.450–4.500)

## 2024-02-20 DIAGNOSIS — F4312 Post-traumatic stress disorder, chronic: Secondary | ICD-10-CM | POA: Diagnosis not present

## 2024-02-23 ENCOUNTER — Other Ambulatory Visit: Payer: Self-pay | Admitting: Family Medicine

## 2024-02-23 DIAGNOSIS — E1169 Type 2 diabetes mellitus with other specified complication: Secondary | ICD-10-CM

## 2024-02-25 DIAGNOSIS — S52021A Displaced fracture of olecranon process without intraarticular extension of right ulna, initial encounter for closed fracture: Secondary | ICD-10-CM | POA: Diagnosis not present

## 2024-02-25 DIAGNOSIS — W19XXXA Unspecified fall, initial encounter: Secondary | ICD-10-CM | POA: Diagnosis not present

## 2024-02-25 DIAGNOSIS — M25521 Pain in right elbow: Secondary | ICD-10-CM | POA: Diagnosis not present

## 2024-02-25 DIAGNOSIS — M25551 Pain in right hip: Secondary | ICD-10-CM | POA: Diagnosis not present

## 2024-02-25 DIAGNOSIS — Y92009 Unspecified place in unspecified non-institutional (private) residence as the place of occurrence of the external cause: Secondary | ICD-10-CM | POA: Diagnosis not present

## 2024-03-01 DIAGNOSIS — E039 Hypothyroidism, unspecified: Secondary | ICD-10-CM | POA: Diagnosis not present

## 2024-03-01 DIAGNOSIS — E669 Obesity, unspecified: Secondary | ICD-10-CM | POA: Diagnosis not present

## 2024-03-01 DIAGNOSIS — Z9884 Bariatric surgery status: Secondary | ICD-10-CM | POA: Diagnosis not present

## 2024-03-09 ENCOUNTER — Ambulatory Visit: Payer: 59 | Admitting: Family Medicine

## 2024-03-09 ENCOUNTER — Encounter: Payer: Self-pay | Admitting: Family Medicine

## 2024-03-09 VITALS — BP 119/76 | HR 68 | Temp 98.4°F | Ht 70.0 in | Wt 267.0 lb

## 2024-03-09 DIAGNOSIS — E119 Type 2 diabetes mellitus without complications: Secondary | ICD-10-CM | POA: Diagnosis not present

## 2024-03-09 DIAGNOSIS — K219 Gastro-esophageal reflux disease without esophagitis: Secondary | ICD-10-CM

## 2024-03-09 DIAGNOSIS — M5136 Other intervertebral disc degeneration, lumbar region with discogenic back pain only: Secondary | ICD-10-CM | POA: Diagnosis not present

## 2024-03-09 DIAGNOSIS — F439 Reaction to severe stress, unspecified: Secondary | ICD-10-CM

## 2024-03-09 DIAGNOSIS — E785 Hyperlipidemia, unspecified: Secondary | ICD-10-CM | POA: Diagnosis not present

## 2024-03-09 DIAGNOSIS — E1169 Type 2 diabetes mellitus with other specified complication: Secondary | ICD-10-CM | POA: Diagnosis not present

## 2024-03-09 DIAGNOSIS — Z794 Long term (current) use of insulin: Secondary | ICD-10-CM | POA: Diagnosis not present

## 2024-03-09 LAB — BAYER DCA HB A1C WAIVED: HB A1C (BAYER DCA - WAIVED): 6.4 % — ABNORMAL HIGH (ref 4.8–5.6)

## 2024-03-09 MED ORDER — PANTOPRAZOLE SODIUM 40 MG PO TBEC: 40.0000 mg | DELAYED_RELEASE_TABLET | Freq: Two times a day (BID) | ORAL | 3 refills | Status: DC

## 2024-03-09 MED ORDER — GABAPENTIN 300 MG PO CAPS: ORAL_CAPSULE | ORAL | 3 refills | Status: DC

## 2024-03-09 MED ORDER — DEXCOM G7 SENSOR MISC: 3 refills | Status: DC

## 2024-03-09 MED ORDER — SOLIQUA 100-33 UNT-MCG/ML ~~LOC~~ SOPN: 40.0000 [IU] | PEN_INJECTOR | Freq: Every day | SUBCUTANEOUS | 3 refills | Status: DC

## 2024-03-09 NOTE — Progress Notes (Signed)
 Subjective: CC:DM PCP: Raliegh Ip, DO [id number] Marvin Espinoza is a 53 y.o. male presenting to clinic today for:  1. Type 2 Diabetes with hyperlipidemia:  Patient is compliant with medications.  Needs refills.  No reports of hypoglycemic episodes.  Utilizing fingerstick glucose.  Would be interested in CGM however.  Currently injecting 40 units of Soliqua daily and continues to take metformin twice daily as prescribed.  Compliant with Crestor  Diabetes Health Maintenance Due  Topic Date Due   OPHTHALMOLOGY EXAM  03/17/2024   FOOT EXAM  06/26/2024   HEMOGLOBIN A1C  07/08/2024    Last A1c:  Lab Results  Component Value Date   HGBA1C 6.1 (H) 01/09/2024    ROS: Denies chest pain, shortness of breath, edema.  He did have a mechanical fall recently where he injured his elbow and left hip.  ROS: Per HPI  Allergies  Allergen Reactions   Abilify [Aripiprazole] Anaphylaxis and Swelling    "slurred speech" FACIAL DROOPING, TONGUE SWELLING, TARDIVE DYSKENESIA    Caplyta [Lumateperone] Anaphylaxis   Pregabalin Shortness Of Breath   Seroquel [Quetiapine Fumarate]     tardive dyskinesia   Alcohol Itching    Drinking alcohol   Ambien [Zolpidem] Other (See Comments)    NIGHT TERRORS   Anacin-3 [Acetaminophen] Itching    REGULAR TYLENOL IS OKAY.  NEEDS BENADRYL TO TAKE THIS   Cariprazine Other (See Comments)    Tardive dyskenesia   Cariprazine Hcl Itching   Latuda [Lurasidone Hcl] Other (See Comments)    RUNS SUGAR TOO HIGH   Lurasidone Other (See Comments)    TARDIVE DYSKINESIA   Prozac [Fluoxetine] Itching   Victoza [Liraglutide] Other (See Comments)    Severe heartburn    Hydrocodone Other (See Comments)   Bupropion Rash   Hydrocodone-Acetaminophen Itching   Risperidone Rash   Tegretol [Carbamazepine] Rash   Past Medical History:  Diagnosis Date   Anxiety    Bipolar disorder (HCC)    CTS (carpal tunnel syndrome)    Depression    Diabetes mellitus without  complication (HCC)    GERD (gastroesophageal reflux disease)    H/O bariatric surgery 2016   Head injury 05/06/2017   Hypothyroidism    Hypothyroidism    Sleep apnea    no longer with OSA d/t over 100 pd weight loss    Current Outpatient Medications:    ALPRAZolam (XANAX) 1 MG tablet, 1 mg. Six times per day prn, Disp: , Rfl:    Blood Glucose Monitoring Suppl (ONETOUCH VERIO) w/Device KIT, Use to test blood sugar daily as directed, Disp: 1 kit, Rfl: 0   calcium carbonate (OS-CAL) 1250 (500 Ca) MG chewable tablet, Chew 1 tablet by mouth daily., Disp: , Rfl:    Calcium-Vitamin D-Vitamin K (SM CALCIUM SOFT CHEWS PO), Take 1 application by mouth 2 (two) times daily. Chew calcium tabs twice a day.  (bariatric surgery), Disp: , Rfl:    docusate sodium (COLACE) 100 MG capsule, Take 100 mg by mouth 2 (two) times daily., Disp: , Rfl:    Dulaglutide (TRULICITY) 0.75 MG/0.5ML SOAJ, Inject 0.75 mg into the skin once a week. DX: E11.65, Disp: 2 mL, Rfl: 5   famotidine (PEPCID) 20 MG tablet, TAKE ONE (1) TABLET BY MOUTH TWO (2) TIMES DAILY, Disp: 180 tablet, Rfl: 0   Ferrous Sulfate 50 MG TBCR, Take by mouth., Disp: , Rfl:    FLUoxetine (PROZAC) 20 MG tablet, Take 20 mg by mouth daily., Disp: , Rfl:    gabapentin (  NEURONTIN) 300 MG capsule, TAKE 1 CAPSULE BY MOUTH THREE TIMES A DAY, Disp: 270 capsule, Rfl: 3   glucose blood test strip, test blood sugar 2x daily Dx E11.9, Disp: 100 each, Rfl: 3   hydrOXYzine (VISTARIL) 25 MG capsule, Take 25 mg by mouth 3 (three) times daily., Disp: , Rfl:    insulin degludec (TRESIBA FLEXTOUCH) 200 UNIT/ML FlexTouch Pen, Inject up to 60 units subcutaneously daily per provider guidance, Disp: 9 mL, Rfl: 5   lamoTRIgine (LAMICTAL) 200 MG tablet, Take 400 mg by mouth at bedtime. , Disp: , Rfl:    levothyroxine (SYNTHROID) 125 MCG tablet, Take 125 mcg by mouth daily before breakfast., Disp: , Rfl:    Magnesium Gluconate (MAGNESIUM 27 PO), Take by mouth., Disp: , Rfl:     meclizine (ANTIVERT) 25 MG tablet, TAKE 1 TABLET BY MOUTH 3 TIMES DAILY AS NEEDED FOR DIZZINESS, Disp: 30 tablet, Rfl: 0   metFORMIN (GLUCOPHAGE) 1000 MG tablet, Take 1 tablet (1,000 mg total) by mouth 2 (two) times daily with a meal., Disp: 180 tablet, Rfl: 3   Multiple Vitamin (MULTIVITAMIN WITH MINERALS) TABS tablet, Take 1 tablet by mouth 2 (two) times daily. For Vitamin supplementation (Patient taking differently: Take 1 tablet by mouth 2 (two) times daily. For Vitamin supplementation FROM GASTRIC BYPASS), Disp: , Rfl:    OLANZapine (ZYPREXA) 2.5 MG tablet, Take 2.5 mg by mouth at bedtime., Disp: , Rfl:    ondansetron (ZOFRAN-ODT) 4 MG disintegrating tablet, Take 1-2 tablets (4-8 mg total) by mouth every 8 (eight) hours as needed for nausea or vomiting., Disp: 30 tablet, Rfl: 0   OneTouch Delica Lancets 30G MISC, Use to test blood sugar daily as directed, Disp: 100 each, Rfl: 3   pantoprazole (PROTONIX) 40 MG tablet, Take 1 tablet (40 mg total) by mouth 2 (two) times daily., Disp: 180 tablet, Rfl: 3   promethazine (PHENERGAN) 12.5 MG tablet, TAKE 1 TO 2 TABLETS EVERY 8 HOURS AS NEEDED FOR NAUSEA AND VOMITING. ONLY USE IF ABSOLUTELY NEEDED. Further fills PER GI!, Disp: 60 tablet, Rfl: 0   psyllium (METAMUCIL) 58.6 % powder, Take 1 packet by mouth 2 (two) times daily., Disp: , Rfl:    rosuvastatin (CRESTOR) 5 MG tablet, TAKE ONE (1) TABLET BY MOUTH EVERY DAY, Disp: 90 tablet, Rfl: 0   SOLIQUA 100-33 UNT-MCG/ML SOPN, INJECT 40-60 UNITS IN TO THE SKIN IN THEMORNING, Disp: 15 mL, Rfl: 0   STEGLATRO 15 MG TABS tablet, TAKE 1 TABLET BY MOUTH EVERY MORNING BEFORE BREAKFAST, Disp: 90 tablet, Rfl: 0   tiZANidine (ZANAFLEX) 4 MG tablet, TAKE 1 TABLET BY MOUTH EVERY 8 HOURS AS NEEDED FOR MUSCLE SPASMS, Disp: 90 tablet, Rfl: 1   vitamin B-12 (CYANOCOBALAMIN) 250 MCG tablet, Take 1 tablet (250 mcg total) by mouth daily. For low B-12 supplementation, Disp: 1 tablet, Rfl:  Social History   Socioeconomic History    Marital status: Married    Spouse name: Taven Strite   Number of children: 1   Years of education: 12   Highest education level: Never attended school  Occupational History   Not on file  Tobacco Use   Smoking status: Never    Passive exposure: Never   Smokeless tobacco: Never  Vaping Use   Vaping status: Never Used  Substance and Sexual Activity   Alcohol use: No    Comment: severe allergy to alcohol!!   Drug use: No   Sexual activity: Yes    Partners: Female  Other Topics Concern  Not on file  Social History Narrative   Lives with Wife, and one child   Two story   He stays there to keep an eye on the house.    His son comes to visit also.   Right handed.   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          Social Drivers of Corporate investment banker Strain: High Risk (01/05/2024)   Overall Financial Resource Strain (CARDIA)    Difficulty of Paying Living Expenses: Very hard  Food Insecurity: Food Insecurity Present (01/05/2024)   Hunger Vital Sign    Worried About Running Out of Food in the Last Year: Sometimes true    Ran Out of Food in the Last Year: Often true  Transportation Needs: No Transportation Needs (01/05/2024)   PRAPARE - Administrator, Civil Service (Medical): No    Lack of Transportation (Non-Medical): No  Physical Activity: Sufficiently Active (01/05/2024)   Exercise Vital Sign    Days of Exercise per Week: 1 day    Minutes of Exercise per Session: 150+ min  Stress: Stress Concern Present (01/05/2024)   [employer] of Occupational Health - Occupational Stress Questionnaire    Feeling of Stress : Very much  Social Connections: Moderately Integrated (01/05/2024)   Social Connection and Isolation Panel [NHANES]    Frequency of Communication with Friends and Family: Three times a week    Frequency of Social Gatherings with Friends and Family: Once a week    Attends Religious Services: Never    Database administrator or Organizations: No     Attends Engineer, structural: More than 4 times per year    Marital Status: Married  Catering manager Violence: Not At Risk (12/25/2023)   Humiliation, Afraid, Rape, and Kick questionnaire    Fear of Current or Ex-Partner: No    Emotionally Abused: No    Physically Abused: No    Sexually Abused: No   Family History  Problem Relation Age of Onset   Diabetes Mother    Heart disease Mother    Depression Mother    Esophageal cancer Mother    COPD Father    Heart disease Father    Kidney disease Father    Mental illness Father    Depression Father    Mental illness Sister    Depression Sister    Depression Son    Diabetes Mellitus II Son    Hyperlipidemia Son    Esophageal cancer Maternal Uncle    Colon cancer Neg Hx    Rectal cancer Neg Hx    Stomach cancer Neg Hx     Objective: Office vital signs reviewed. BP 119/76   Pulse 68   Temp 98.4 F (36.9 C)   Ht 5\' 10"  (1.778 m)   Wt 267 lb (121.1 kg)   SpO2 98%   BMI 38.31 kg/m   Physical Examination:  General: Awake, alert, obese, No acute distress HEENT: sclera white, MMM Cardio: regular rate and rhythm, S1S2 heard, no murmurs appreciated Pulm: clear to auscultation bilaterally, no wheezes, rhonchi or rales; normal work of breathing on room air Psych: Depressed.  Poor eye contact.     03/09/2024   11:33 AM 01/09/2024   10:58 AM 12/25/2023   11:15 AM  Depression screen PHQ 2/9  Decreased Interest 3 1 0  Down, Depressed, Hopeless 3 2 1   PHQ - 2 Score 6 3 1   Altered sleeping 3 2 1  Tired, decreased energy 3 2 1   Change in appetite 3 1 0  Feeling bad or failure about yourself  3 3 0  Trouble concentrating 3 3 0  Moving slowly or fidgety/restless 3 3 0  Suicidal thoughts 3 0 0  PHQ-9 Score 27 17 3   Difficult doing work/chores Extremely dIfficult Somewhat difficult Somewhat difficult      03/09/2024   11:33 AM 01/09/2024   10:58 AM 10/07/2023    8:10 AM 09/30/2023    9:47 AM  GAD 7 : Generalized Anxiety  Score  Nervous, Anxious, on Edge 3 3 3 3   Control/stop worrying 3 3 3 3   Worry too much - different things 3 3 3 3   Trouble relaxing 3 3 2 3   Restless 3 3 2 2   Easily annoyed or irritable 3 3 2 2   Afraid - awful might happen 3 1 0 2  Total GAD 7 Score 21 19 15 18   Anxiety Difficulty Extremely difficult  Somewhat difficult Very difficult   Assessment/ Plan: 53 y.o. male   Insulin-requiring or dependent type II diabetes mellitus (HCC) - Plan: Bayer DCA Hb A1c Waived, Continuous Glucose Sensor (DEXCOM G7 SENSOR) MISC, Insulin Glargine-Lixisenatide (SOLIQUA) 100-33 UNT-MCG/ML SOPN  Hyperlipidemia associated with type 2 diabetes mellitus (HCC)  DDD (degenerative disc disease), lumbar - Plan: gabapentin (NEURONTIN) 300 MG capsule  Gastroesophageal reflux disease without esophagitis - Plan: pantoprazole (PROTONIX) 40 MG tablet  Stress at home  Sugar under excellent control with A1c 6.4.  I think that CGM would be an excellent idea given use of insulin combined with GLP.  Order for [facility] sensor placed.  I have also given him 2 samples today  He will continue statin  Gabapentin renewed  PPI renewed as well but we did not discuss GERD  Having a considerable amount of stress with his son as of late.  His PHQ and GAD-7 scores were elevated across the board.  I will CC his psychiatrist as FYI.  They may consider reaching out sooner than later  Raliegh Ip, DO [facility] [phone]

## 2024-03-10 ENCOUNTER — Other Ambulatory Visit: Payer: Self-pay | Admitting: Family Medicine

## 2024-03-10 DIAGNOSIS — E1169 Type 2 diabetes mellitus with other specified complication: Secondary | ICD-10-CM

## 2024-03-10 DIAGNOSIS — K2100 Gastro-esophageal reflux disease with esophagitis, without bleeding: Secondary | ICD-10-CM

## 2024-03-24 DIAGNOSIS — M25521 Pain in right elbow: Secondary | ICD-10-CM | POA: Diagnosis not present

## 2024-04-13 DIAGNOSIS — H40033 Anatomical narrow angle, bilateral: Secondary | ICD-10-CM | POA: Diagnosis not present

## 2024-04-13 DIAGNOSIS — E113293 Type 2 diabetes mellitus with mild nonproliferative diabetic retinopathy without macular edema, bilateral: Secondary | ICD-10-CM | POA: Diagnosis not present

## 2024-04-16 DIAGNOSIS — F4312 Post-traumatic stress disorder, chronic: Secondary | ICD-10-CM | POA: Diagnosis not present

## 2024-04-20 NOTE — Progress Notes (Unsigned)
   [name], PhD, LAT, ATC acting as a scribe for [name], MD.  Marvin Espinoza is a 53 y.o. male who presents to [facility] today for 64-month f/u concussion. On 9/15, he slipped on some water in a [employer] bathroom, hitting his head on the floor. Pt was last seen by Dr. [name] on 02/10/24 and was advised to cont HEP and advance activity as tolerated.  Today, pt reports ***  Pertinent review of systems: ***  Relevant historical information: ***   Exam:  There were no vitals taken for this visit. General: Well Developed, well nourished, and in no acute distress.   MSK: ***    Lab and Radiology Results No results found for this or any previous visit (from the past 72 hours). No results found.     Assessment and Plan: 53 y.o. male with ***   PDMP not reviewed this encounter. No orders of the defined types were placed in this encounter.  No orders of the defined types were placed in this encounter.    Discussed warning signs or symptoms. Please see discharge instructions. Patient expresses understanding.   ***

## 2024-04-21 ENCOUNTER — Encounter: Payer: Self-pay | Admitting: Family Medicine

## 2024-04-21 ENCOUNTER — Ambulatory Visit (INDEPENDENT_AMBULATORY_CARE_PROVIDER_SITE_OTHER): Payer: 59 | Admitting: Family Medicine

## 2024-04-21 VITALS — BP 130/86 | HR 64 | Ht 70.0 in | Wt 241.0 lb

## 2024-04-21 DIAGNOSIS — S060X0D Concussion without loss of consciousness, subsequent encounter: Secondary | ICD-10-CM | POA: Diagnosis not present

## 2024-04-21 NOTE — Patient Instructions (Signed)
 Thank you for coming in today.

## 2024-04-23 DIAGNOSIS — F4312 Post-traumatic stress disorder, chronic: Secondary | ICD-10-CM | POA: Diagnosis not present

## 2024-04-25 ENCOUNTER — Encounter (INDEPENDENT_AMBULATORY_CARE_PROVIDER_SITE_OTHER): Payer: Self-pay | Admitting: Family Medicine

## 2024-04-25 DIAGNOSIS — M109 Gout, unspecified: Secondary | ICD-10-CM

## 2024-04-27 MED ORDER — COLCHICINE 0.6 MG PO TABS: ORAL_TABLET | ORAL | 2 refills | Status: AC

## 2024-04-27 NOTE — Telephone Encounter (Signed)
 Hello,  I have sent in colchine Prescription to your pharmacy. Take 1.2 mg then one hour late take 0.6 mg . Max 1.8 mg/day.  Let us  know if your symptoms worsen or do not improve.    Tommas Fragmin, FNP  Approximately 5 minutes was spent documenting and reviewing patient's chart.

## 2024-05-06 DIAGNOSIS — F4312 Post-traumatic stress disorder, chronic: Secondary | ICD-10-CM | POA: Diagnosis not present

## 2024-05-07 ENCOUNTER — Ambulatory Visit: Payer: 59 | Admitting: Family Medicine

## 2024-05-24 ENCOUNTER — Other Ambulatory Visit: Payer: Self-pay | Admitting: Family Medicine

## 2024-05-24 ENCOUNTER — Encounter: Payer: Self-pay | Admitting: Family Medicine

## 2024-05-24 DIAGNOSIS — M51369 Other intervertebral disc degeneration, lumbar region without mention of lumbar back pain or lower extremity pain: Secondary | ICD-10-CM

## 2024-05-24 DIAGNOSIS — E1169 Type 2 diabetes mellitus with other specified complication: Secondary | ICD-10-CM

## 2024-06-02 ENCOUNTER — Telehealth: Payer: Self-pay | Admitting: Family Medicine

## 2024-06-07 DIAGNOSIS — F410 Panic disorder [episodic paroxysmal anxiety] without agoraphobia: Secondary | ICD-10-CM | POA: Diagnosis not present

## 2024-06-07 DIAGNOSIS — F3112 Bipolar disorder, current episode manic without psychotic features, moderate: Secondary | ICD-10-CM | POA: Diagnosis not present

## 2024-06-07 DIAGNOSIS — F3131 Bipolar disorder, current episode depressed, mild: Secondary | ICD-10-CM | POA: Diagnosis not present

## 2024-06-14 ENCOUNTER — Encounter: Payer: Self-pay | Admitting: Family Medicine

## 2024-07-08 ENCOUNTER — Ambulatory Visit: Admitting: Family Medicine

## 2024-07-08 ENCOUNTER — Ambulatory Visit: Payer: Self-pay

## 2024-07-08 ENCOUNTER — Encounter: Payer: Self-pay | Admitting: Family Medicine

## 2024-07-08 VITALS — BP 122/75 | HR 70 | Temp 98.7°F | Ht 70.0 in | Wt 255.6 lb

## 2024-07-08 DIAGNOSIS — R509 Fever, unspecified: Secondary | ICD-10-CM | POA: Diagnosis not present

## 2024-07-08 LAB — VERITOR FLU A/B WAIVED
Influenza A: NEGATIVE
Influenza B: NEGATIVE

## 2024-07-08 LAB — URINALYSIS, ROUTINE W REFLEX MICROSCOPIC
Bilirubin, UA: NEGATIVE
Glucose, UA: NEGATIVE
Ketones, UA: NEGATIVE
Leukocytes,UA: NEGATIVE
Nitrite, UA: NEGATIVE
Protein,UA: NEGATIVE
RBC, UA: NEGATIVE
Specific Gravity, UA: <1.005 — ABNORMAL LOW (ref 1.005–1.030)
Urobilinogen, Ur: 0.2 mg/dL (ref 0.2–1.0)
pH, UA: 6 (ref 5.0–7.5)

## 2024-07-08 NOTE — Telephone Encounter (Signed)
 FYI Only or Action Required?: FYI only for provider.  Patient was last seen in primary care on 03/09/2024 by Jolinda Norene HERO, DO.  Called Nurse Triage reporting Fever.  Symptoms began yesterday.  Interventions attempted: OTC medications: tylenol .  Symptoms are: gradually improving.  Triage Disposition: See HCP Within 4 Hours (Or PCP Triage)  Patient/caregiver understands and will follow disposition?: Yes   Apt today          Copied from CRM [id number]. Topic: Clinical - Red Word Triage >> Jul 08, 2024  8:07 AM Merlynn A wrote: Red Word that prompted transfer to Nurse Triage: Fever 101+/Chills Reason for Disposition  [1] Fever > 100 F (37.8 C) AND [2] diabetes mellitus or weak immune system (e.g., HIV positive, cancer chemo, splenectomy, organ transplant, chronic steroids)  Answer Assessment - Initial Assessment Questions 1. TEMPERATURE: What is the most recent temperature?  How was it measured?      100.9 yesterday 2. ONSET: When did the fever start?     yesteday 3. CHILLS: Do you have chills? If yes: How bad are they?  (e.g., none, mild, moderate, severe)     severe 4. OTHER SYMPTOMS: Do you have any other symptoms besides the fever?  (e.g., abdomen pain, cough, diarrhea, earache, headache, sore throat, urination pain)     Pain with urination, severe fatigue, body aches, shoulder and arm pain,  5. CAUSE: If there are no symptoms, ask: What do you think is causing the fever?      infection 6. CONTACTS: Does anyone else in the family have an infection?     denies 7. TREATMENT: What have you done so far to treat this fever? (e.g., OTC fever medicines)     Tylenol  8. IMMUNOCOMPROMISE: Do you have any of the following: diabetes, HIV positive, splenectomy, cancer chemotherapy, chronic steroid treatment, transplant patient, etc.?     DM2 9. PREGNANCY: Is there any chance you are pregnant? When was your last menstrual period?     na 10. TRAVEL:  Have you traveled out of the country in the last month? (e.g., travel history, exposures)       denies  Protocols used: [facility]

## 2024-07-08 NOTE — Progress Notes (Signed)
 Acute Office Visit  Subjective:     Patient ID: Marvin Espinoza, male    DOB: [birth year], 53 y.o.   MRN: [id number]  Chief Complaint  Patient presents with   Fever    Fever  This is a new problem. The current episode started yesterday. The maximum temperature noted was 100 to 100.9 F. Associated symptoms include congestion, coughing (dry), headaches, nausea, urinary pain and wheezing. Pertinent negatives include no abdominal pain, diarrhea, ear pain, muscle aches, sore throat or vomiting. He has tried acetaminophen  for the symptoms.   Insulin dependent T2DM- controlled.   Review of Systems  Constitutional:  Positive for fever.  HENT:  Positive for congestion. Negative for ear pain and sore throat.   Respiratory:  Positive for cough (dry) and wheezing.   Gastrointestinal:  Positive for nausea. Negative for abdominal pain, diarrhea and vomiting.  Genitourinary:  Positive for dysuria.  Neurological:  Positive for headaches.        Objective:    BP 122/75   Pulse 70   Temp 98.7 F (37.1 C) (Temporal)   Ht 5' 10 (1.778 m)   Wt 255 lb 9.6 oz (115.9 kg)   SpO2 97%   BMI 36.67 kg/m    Physical Exam Vitals and nursing note reviewed.  Constitutional:      General: He is not in acute distress.    Appearance: He is not ill-appearing, toxic-appearing or diaphoretic.  HENT:     Head: Normocephalic and atraumatic.     Right Ear: Tympanic membrane, ear canal and external ear normal.     Left Ear: Tympanic membrane, ear canal and external ear normal.     Nose: Congestion present.     Mouth/Throat:     Mouth: Mucous membranes are moist.     Pharynx: Oropharynx is clear. Uvula midline. No pharyngeal swelling, oropharyngeal exudate, posterior oropharyngeal erythema or postnasal drip.     Tonsils: No tonsillar exudate or tonsillar abscesses. 1+ on the right. 1+ on the left.  Eyes:     General:        Right eye: No discharge.        Left eye: No discharge.      Conjunctiva/sclera: Conjunctivae normal.  Cardiovascular:     Rate and Rhythm: Normal rate and regular rhythm.     Heart sounds: No murmur heard. Pulmonary:     Effort: Pulmonary effort is normal. No respiratory distress.     Breath sounds: Normal breath sounds. No wheezing, rhonchi or rales.  Abdominal:     General: Bowel sounds are normal. There is no distension.     Palpations: Abdomen is soft.     Tenderness: There is no abdominal tenderness. There is no guarding or rebound.  Musculoskeletal:     Cervical back: Neck supple.     Right lower leg: No edema.     Left lower leg: No edema.  Lymphadenopathy:     Cervical: No cervical adenopathy.  Skin:    General: Skin is warm and dry.  Neurological:     General: No focal deficit present.     Mental Status: He is alert and oriented to person, place, and time.  Psychiatric:        Mood and Affect: Mood normal.        Behavior: Behavior normal.     Urine dipstick shows negative for all components.       Assessment & Plan:   Marvin Espinoza was seen today for fever.  Diagnoses and all orders for this visit:  Fever, unspecified fever cause Negative UA, flu. He will take a home Covid test and notify me of results. Will repeat home Covid test in negative in 24 hours. Discussed antiviral treatment if covid positive given T2DM. Discussed viral etiology, symptomatic care and return precautions.  -     Veritor Flu A/B Waived -     Urinalysis, Routine w reflex microscopic  Return if symptoms worsen or fail to improve.  The patient indicates understanding of these issues and agrees with the plan.  Marvin CHRISTELLA Search, FNP

## 2024-07-08 NOTE — Telephone Encounter (Signed)
Appointment is made.

## 2024-07-12 ENCOUNTER — Encounter: Payer: Self-pay | Admitting: Family Medicine

## 2024-07-12 ENCOUNTER — Ambulatory Visit: Admitting: Family Medicine

## 2024-07-12 ENCOUNTER — Ambulatory Visit (INDEPENDENT_AMBULATORY_CARE_PROVIDER_SITE_OTHER): Admitting: *Deleted

## 2024-07-12 VITALS — BP 98/65 | HR 62 | Temp 97.9°F | Ht 70.0 in | Wt 254.0 lb

## 2024-07-12 DIAGNOSIS — E039 Hypothyroidism, unspecified: Secondary | ICD-10-CM

## 2024-07-12 DIAGNOSIS — J9801 Acute bronchospasm: Secondary | ICD-10-CM | POA: Diagnosis not present

## 2024-07-12 DIAGNOSIS — Z0000 Encounter for general adult medical examination without abnormal findings: Secondary | ICD-10-CM

## 2024-07-12 DIAGNOSIS — Z794 Long term (current) use of insulin: Secondary | ICD-10-CM

## 2024-07-12 DIAGNOSIS — E1149 Type 2 diabetes mellitus with other diabetic neurological complication: Secondary | ICD-10-CM

## 2024-07-12 DIAGNOSIS — E1169 Type 2 diabetes mellitus with other specified complication: Secondary | ICD-10-CM

## 2024-07-12 DIAGNOSIS — E119 Type 2 diabetes mellitus without complications: Secondary | ICD-10-CM | POA: Diagnosis not present

## 2024-07-12 DIAGNOSIS — Z125 Encounter for screening for malignant neoplasm of prostate: Secondary | ICD-10-CM

## 2024-07-12 DIAGNOSIS — Z7984 Long term (current) use of oral hypoglycemic drugs: Secondary | ICD-10-CM | POA: Diagnosis not present

## 2024-07-12 DIAGNOSIS — E785 Hyperlipidemia, unspecified: Secondary | ICD-10-CM | POA: Diagnosis not present

## 2024-07-12 DIAGNOSIS — M5136 Other intervertebral disc degeneration, lumbar region with discogenic back pain only: Secondary | ICD-10-CM | POA: Diagnosis not present

## 2024-07-12 DIAGNOSIS — Z0001 Encounter for general adult medical examination with abnormal findings: Secondary | ICD-10-CM

## 2024-07-12 DIAGNOSIS — Z9889 Other specified postprocedural states: Secondary | ICD-10-CM | POA: Diagnosis not present

## 2024-07-12 DIAGNOSIS — K2100 Gastro-esophageal reflux disease with esophagitis, without bleeding: Secondary | ICD-10-CM

## 2024-07-12 LAB — HM DIABETES EYE EXAM

## 2024-07-12 LAB — BAYER DCA HB A1C WAIVED: HB A1C (BAYER DCA - WAIVED): 5.9 % — ABNORMAL HIGH (ref 4.8–5.6)

## 2024-07-12 MED ORDER — METFORMIN HCL 1000 MG PO TABS: 1000.0000 mg | ORAL_TABLET | Freq: Two times a day (BID) | ORAL | 3 refills | Status: AC

## 2024-07-12 MED ORDER — SOLIQUA 100-33 UNT-MCG/ML ~~LOC~~ SOPN: 40.0000 [IU] | PEN_INJECTOR | Freq: Every day | SUBCUTANEOUS | 3 refills | Status: AC

## 2024-07-12 MED ORDER — FAMOTIDINE 20 MG PO TABS
20.0000 mg | ORAL_TABLET | Freq: Two times a day (BID) | ORAL | 3 refills | Status: AC | PRN
Start: 2024-07-12 — End: ?

## 2024-07-12 MED ORDER — DEXCOM G7 SENSOR MISC: 3 refills | Status: AC

## 2024-07-12 MED ORDER — GABAPENTIN 300 MG PO CAPS: ORAL_CAPSULE | ORAL | 3 refills | Status: AC

## 2024-07-12 MED ORDER — ALBUTEROL SULFATE HFA 108 (90 BASE) MCG/ACT IN AERS: 2.0000 | INHALATION_SPRAY | Freq: Four times a day (QID) | RESPIRATORY_TRACT | 0 refills | Status: AC | PRN

## 2024-07-12 MED ORDER — PANTOPRAZOLE SODIUM 40 MG PO TBEC: 40.0000 mg | DELAYED_RELEASE_TABLET | Freq: Two times a day (BID) | ORAL | 3 refills | Status: AC

## 2024-07-12 MED ORDER — ROSUVASTATIN CALCIUM 5 MG PO TABS: 5.0000 mg | ORAL_TABLET | Freq: Every day | ORAL | 3 refills | Status: AC

## 2024-07-12 NOTE — Progress Notes (Signed)
 Arrived on 07/12/2024 and has given verbal consent to obtain images and complete their overdue diabetic retinal screening.  The images have been sent to an ophthalmologist or optometrist for review and interpretation.  Results will be sent back to [facility] for review.  Patient has been informed they will be contacted when we receive the results via telephone or MyChart.

## 2024-07-12 NOTE — Progress Notes (Signed)
 Marvin Espinoza is a 53 y.o. male presents to office today for annual physical exam examination.    Concerns today include: 1. Type 2 Diabetes with hyperlipidemia associated with obesity/ h/o gastric surgery:  Glucometer: Dexcom 7 Injecting 40 units of Soliqua , taking metformin , Crestor .  Saw podiatry and was told that removal of the toenail would not resolve his onychomycosis.  They did not start any oral medications at his visit because he had a slight elevation in LFTs that was felt to be secondary to isolated and use as they did resolve after he discontinued use of that medication.  Last eye exam: need Last foot exam: needs Last A1c:  Lab Results  Component Value Date   HGBA1C 6.4 (H) 03/09/2024   Nephropathy screen indicated?: needs Last flu, zoster and/or pneumovax:  Immunization History  Administered Date(s) Administered   Fluzone Influenza virus vaccine,trivalent (IIV3), split virus 09/24/2022   H1N1 11/03/2008   Influenza, Seasonal, Injecte, Preservative Fre 09/03/2023   Influenza,inj,Quad PF,6+ Mos 11/19/2016, 10/20/2017, 09/11/2018, 08/20/2019, 09/27/2020   Influenza,inj,quad, With Preservative 12/16/2017   Influenza-Unspecified 10/10/2021   Moderna Sars-Covid-2 Vaccination 03/04/2020, 04/05/2020, 10/09/2020   Novel Infuenza-h1n1-09 11/03/2008   PNEUMOCOCCAL CONJUGATE-20 12/24/2021   Tdap 05/10/2020   Zoster Recombinant(Shingrix) 08/01/2021, 10/08/2021    ROS: Reports no chest pain, shortness of breath.  No visual disturbance.  Wears eyeglasses.  2.  Chronic back pain Again discontinued tizanidine  due to elevations in LFTs.  Has not been taking his gabapentin  regularly because he is been afraid that it may increase his risk of dementia and he is already on a benzodiazepine which is being weaned down for same.  Overall he is been feeling okay though.  He is accompanied today's visit by his wife.  Occupation: Does not work, Marital status: Married, Substance use:  None Health Maintenance Due  Topic Date Due   Diabetic kidney evaluation - Urine ACR  06/26/2024   Refills needed today: all  Immunization History  Administered Date(s) Administered   Fluzone Influenza virus vaccine,trivalent (IIV3), split virus 09/24/2022   H1N1 11/03/2008   Influenza, Seasonal, Injecte, Preservative Fre 09/03/2023   Influenza,inj,Quad PF,6+ Mos 11/19/2016, 10/20/2017, 09/11/2018, 08/20/2019, 09/27/2020   Influenza,inj,quad, With Preservative 12/16/2017   Influenza-Unspecified 10/10/2021   Moderna Sars-Covid-2 Vaccination 03/04/2020, 04/05/2020, 10/09/2020   Novel Infuenza-h1n1-09 11/03/2008   PNEUMOCOCCAL CONJUGATE-20 12/24/2021   Tdap 05/10/2020   Zoster Recombinant(Shingrix) 08/01/2021, 10/08/2021   Past Medical History:  Diagnosis Date   Anxiety    Bipolar disorder (HCC)    Concussion without loss of consciousness 10/22/2023   CTS (carpal tunnel syndrome)    Depression    Diabetes mellitus without complication (HCC)    Drug overdose 01/25/2020   GERD (gastroesophageal reflux disease)    H/O bariatric surgery 2016   Head injury 05/06/2017   History of concussion 02/10/2017   Hypothyroidism    Hypothyroidism    Overdose of benzodiazepine, intentional self-harm, initial encounter (HCC) 01/26/2020   Sleep apnea    no longer with OSA d/t over 100 pd weight loss   Social History   Socioeconomic History   Marital status: Married    Spouse name: Marvin Espinoza   Number of children: 1   Years of education: 12   Highest education level: Never attended school  Occupational History   Not on file  Tobacco Use   Smoking status: Never    Passive exposure: Never   Smokeless tobacco: Never  Vaping Use   Vaping status: Never Used  Substance and  Sexual Activity   Alcohol use: No    Comment: severe allergy to alcohol!!   Drug use: No   Sexual activity: Yes    Partners: Female  Other Topics Concern   Not on file  Social History Narrative   Lives with  Wife, and one child   Two story   He stays there to keep an eye on the house.    His son comes to visit also.   Right handed.   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          Social Drivers of Corporate investment banker Strain: Medium Risk (07/08/2024)   Overall Financial Resource Strain (CARDIA)    Difficulty of Paying Living Expenses: Somewhat hard  Food Insecurity: Food Insecurity Present (07/08/2024)   Hunger Vital Sign    Worried About Running Out of Food in the Last Year: Sometimes true    Ran Out of Food in the Last Year: Sometimes true  Transportation Needs: No Transportation Needs (07/08/2024)   PRAPARE - Administrator, Civil Service (Medical): No    Lack of Transportation (Non-Medical): No  Physical Activity: Insufficiently Active (07/08/2024)   Exercise Vital Sign    Days of Exercise per Week: 1 day    Minutes of Exercise per Session: 70 min  Stress: Stress Concern Present (01/05/2024)   [employer] of Occupational Health - Occupational Stress Questionnaire    Feeling of Stress : Very much  Social Connections: Moderately Isolated (07/08/2024)   Social Connection and Isolation Panel    Frequency of Communication with Friends and Family: More than three times a week    Frequency of Social Gatherings with Friends and Family: Twice a week    Attends Religious Services: Patient declined    Database administrator or Organizations: No    Attends Engineer, structural: Not on file    Marital Status: Married  Catering manager Violence: Not At Risk (12/25/2023)   Humiliation, Afraid, Rape, and Kick questionnaire    Fear of Current or Ex-Partner: No    Emotionally Abused: No    Physically Abused: No    Sexually Abused: No   Past Surgical History:  Procedure Laterality Date   ANAL FISSURE REPAIR     BARIATRIC SURGERY     CARPAL TUNNEL RELEASE Left 05/09/2015   Procedure: LEFT CARPAL TUNNEL RELEASE;  Surgeon: Arley Curia, MD;  Location: [facility];  Service: Orthopedics;  Laterality: Left;   CARPAL TUNNEL RELEASE Left 05-09-2015   CARPAL TUNNEL RELEASE Right 06/20/2015   Procedure: RIGHT CARPAL TUNNEL RELEASE;  Surgeon: Arley Curia, MD;  Location: [facility];  Service: Orthopedics;  Laterality: Right;  REGIONAL/FAB   CHOLECYSTECTOMY     COLONOSCOPY     UPPER GASTROINTESTINAL ENDOSCOPY     Family History  Problem Relation Age of Onset   Diabetes Mother    Heart disease Mother    Depression Mother    Esophageal cancer Mother    COPD Father    Heart disease Father    Kidney disease Father    Mental illness Father    Depression Father    Mental illness Sister    Depression Sister    Depression Son    Diabetes Mellitus II Son    Hyperlipidemia Son    Esophageal cancer Maternal Uncle    Colon cancer Neg Hx    Rectal cancer Neg Hx    Stomach cancer Neg Hx  Current Outpatient Medications:    albuterol  (VENTOLIN  HFA) 108 (90 Base) MCG/ACT inhaler, Inhale 2 puffs into the lungs every 6 (six) hours as needed for wheezing or shortness of breath., Disp: 8 g, Rfl: 0   ALPRAZolam  (XANAX ) 1 MG tablet, 1 mg. Six times per day prn, Disp: , Rfl:    Blood Glucose Monitoring Suppl (ONETOUCH VERIO) w/Device KIT, Use to test blood sugar daily as directed, Disp: 1 kit, Rfl: 0   calcium  carbonate (OS-CAL) 1250 (500 Ca) MG chewable tablet, Chew 1 tablet by mouth daily., Disp: , Rfl:    Calcium -Vitamin D -Vitamin K (SM CALCIUM  SOFT CHEWS PO), Take 1 application by mouth 2 (two) times daily. Chew calcium  tabs twice a day.  (bariatric surgery), Disp: , Rfl:    colchicine  0.6 MG tablet, Take 1.2 mg then one hour late take 0.6 mg . Max 1.8 mg/day, Disp: 21 tablet, Rfl: 2   docusate sodium (COLACE) 100 MG capsule, Take 100 mg by mouth 2 (two) times daily., Disp: , Rfl:    Ferrous Sulfate 50 MG TBCR, Take by mouth., Disp: , Rfl:    glucose blood test strip, test blood sugar 2x daily Dx E11.9, Disp: 100 each, Rfl: 3   hydrOXYzine   (VISTARIL ) 25 MG capsule, Take 25 mg by mouth 3 (three) times daily., Disp: , Rfl:    lamoTRIgine  (LAMICTAL ) 200 MG tablet, Take 400 mg by mouth at bedtime. , Disp: , Rfl:    levothyroxine  (SYNTHROID ) 125 MCG tablet, Take 125 mcg by mouth daily before breakfast., Disp: , Rfl:    Magnesium  Gluconate (MAGNESIUM  27 PO), Take by mouth., Disp: , Rfl:    meclizine  (ANTIVERT ) 25 MG tablet, TAKE 1 TABLET BY MOUTH 3 TIMES DAILY AS NEEDED FOR DIZZINESS, Disp: 30 tablet, Rfl: 0   Multiple Vitamin (MULTIVITAMIN WITH MINERALS) TABS tablet, Take 1 tablet by mouth 2 (two) times daily. For Vitamin supplementation (Patient taking differently: Take 1 tablet by mouth 2 (two) times daily. For Vitamin supplementation FROM GASTRIC BYPASS), Disp: , Rfl:    ondansetron  (ZOFRAN -ODT) 4 MG disintegrating tablet, Take 1-2 tablets (4-8 mg total) by mouth every 8 (eight) hours as needed for nausea or vomiting., Disp: 30 tablet, Rfl: 0   OneTouch Delica Lancets 30G MISC, Use to test blood sugar daily as directed, Disp: 100 each, Rfl: 3   promethazine  (PHENERGAN ) 12.5 MG tablet, TAKE 1 TO 2 TABLETS EVERY 8 HOURS AS NEEDED FOR NAUSEA AND VOMITING. ONLY USE IF ABSOLUTELY NEEDED. Further fills PER GI!, Disp: 60 tablet, Rfl: 0   psyllium (METAMUCIL) 58.6 % powder, Take 1 packet by mouth 2 (two) times daily., Disp: , Rfl:    vitamin B-12 (CYANOCOBALAMIN ) 250 MCG tablet, Take 1 tablet (250 mcg total) by mouth daily. For low B-12 supplementation, Disp: 1 tablet, Rfl:    Continuous Glucose Sensor (DEXCOM G7 SENSOR) MISC, Check sugar continuously. Change every 10 days. E11.9, Disp: 9 each, Rfl: 3   famotidine  (PEPCID ) 20 MG tablet, Take 1 tablet (20 mg total) by mouth 2 (two) times daily as needed for heartburn or indigestion., Disp: 180 tablet, Rfl: 3   gabapentin  (NEURONTIN ) 300 MG capsule, TAKE 1 CAPSULE BY MOUTH THREE TIMES A DAY, Disp: 270 capsule, Rfl: 3   Insulin Glargine-Lixisenatide (SOLIQUA ) 100-33 UNT-MCG/ML SOPN, Inject 40 Units  into the skin daily., Disp: 45 mL, Rfl: 3   metFORMIN  (GLUCOPHAGE ) 1000 MG tablet, Take 1 tablet (1,000 mg total) by mouth 2 (two) times daily with a meal., Disp: 180 tablet,  Rfl: 3   pantoprazole  (PROTONIX ) 40 MG tablet, Take 1 tablet (40 mg total) by mouth 2 (two) times daily., Disp: 180 tablet, Rfl: 3   rosuvastatin  (CRESTOR ) 5 MG tablet, Take 1 tablet (5 mg total) by mouth daily., Disp: 90 tablet, Rfl: 3  Allergies  Allergen Reactions   Abilify [Aripiprazole] Anaphylaxis and Swelling    slurred speech FACIAL DROOPING, TONGUE SWELLING, TARDIVE DYSKENESIA    Caplyta [Lumateperone] Anaphylaxis   Pregabalin Shortness Of Breath   Seroquel [Quetiapine Fumarate]     tardive dyskinesia   Alcohol Itching    Drinking alcohol   Ambien  [Zolpidem ] Other (See Comments)    NIGHT TERRORS   Anacin-3 [Acetaminophen ] Itching    REGULAR TYLENOL  IS OKAY.  NEEDS BENADRYL  TO TAKE THIS   Cariprazine Other (See Comments)    Tardive dyskenesia   Cariprazine Hcl Itching   Latuda [Lurasidone Hcl] Other (See Comments)    RUNS SUGAR TOO HIGH   Lurasidone Other (See Comments)    TARDIVE DYSKINESIA   Prozac  [Fluoxetine ] Itching   Victoza [Liraglutide] Other (See Comments)    Severe heartburn    Hydrocodone  Other (See Comments)   Tizanidine      Elevated LFTs   Bupropion Rash   Hydrocodone -Acetaminophen  Itching   Risperidone Rash   Tegretol  [Carbamazepine ] Rash     ROS: Review of Systems Pertinent items noted in HPI and remainder of comprehensive ROS otherwise negative.    Physical exam BP 98/65   Pulse 62   Temp 97.9 F (36.6 C)   Ht 5' 10 (1.778 m)   Wt 254 lb (115.2 kg)   SpO2 95%   BMI 36.45 kg/m  General appearance: alert, cooperative, appears stated age, no distress, and moderately obese Head: Normocephalic, without obvious abnormality, atraumatic Eyes: negative findings: lids and lashes normal, conjunctivae and sclerae normal, corneas clear, and pupils equal, round, reactive to  light and accomodation Ears: normal TM's and external ear canals both ears Nose: Nares normal. Septum midline. Mucosa normal. No drainage or sinus tenderness. Throat: lips, mucosa, and tongue normal; teeth and gums normal Neck: no adenopathy, supple, symmetrical, trachea midline, and thyroid  not enlarged, symmetric, no tenderness/mass/nodules Back: symmetric, no curvature. ROM normal. No CVA tenderness. Lungs: clear to auscultation bilaterally Chest wall: no tenderness Heart: regular rate and rhythm, S1, S2 normal, no murmur, click, rub or gallop Abdomen: Obese, soft, nontender Extremities: extremities normal, atraumatic, no cyanosis or edema Pulses: 2+ and symmetric Skin: Skin color, texture, turgor normal. No rashes or lesions Lymph nodes: Cervical, supraclavicular, and axillary nodes normal. Neurologic: Grossly normal      07/08/2024    1:54 PM 03/09/2024   11:33 AM 01/09/2024   10:58 AM  Depression screen PHQ 2/9  Decreased Interest 2 3 1   Down, Depressed, Hopeless 2 3 2   PHQ - 2 Score 4 6 3   Altered sleeping 3 3 2   Tired, decreased energy 2 3 2   Change in appetite 3 3 1   Feeling bad or failure about yourself  2 3 3   Trouble concentrating 3 3 3   Moving slowly or fidgety/restless 2 3 3   Suicidal thoughts 0 3 0  PHQ-9 Score 19 27 17   Difficult doing work/chores Somewhat difficult Extremely dIfficult Somewhat difficult      07/12/2024    8:26 AM 07/08/2024    1:54 PM 03/09/2024   11:33 AM 01/09/2024   10:58 AM  GAD 7 : Generalized Anxiety Score  Nervous, Anxious, on Edge 2 3 3 3   Control/stop  worrying 3 2 3 3   Worry too much - different things 3 3 3 3   Trouble relaxing 3 2 3 3   Restless 2 2 3 3   Easily annoyed or irritable 3 3 3 3   Afraid - awful might happen 1 0 3 1  Total GAD 7 Score 17 15 21 19   Anxiety Difficulty  Somewhat difficult Extremely difficult    Diabetic Foot Exam - Simple   Simple Foot Form Diabetic Foot exam was performed with the following findings:  Yes 07/12/2024  9:05 AM  Visual Inspection See comments: Yes Sensation Testing Intact to touch and monofilament testing bilaterally: Yes Pulse Check Posterior Tibialis and Dorsalis pulse intact bilaterally: Yes Comments Onychomycotic changes noted to the nails bilaterally with some discoloration of the right great toenail and brittle appearance      Assessment/ Plan: Reyes Lager here for annual physical exam.   Annual physical exam  Insulin-requiring or dependent type II diabetes mellitus (HCC) - Plan: Bayer DCA Hb A1c Waived, Microalbumin / creatinine urine ratio, CMP14+EGFR, CBC, Continuous Glucose Sensor (DEXCOM G7 SENSOR) MISC, Insulin Glargine-Lixisenatide (SOLIQUA ) 100-33 UNT-MCG/ML SOPN, metFORMIN  (GLUCOPHAGE ) 1000 MG tablet  Hyperlipidemia associated with type 2 diabetes mellitus (HCC) - Plan: CMP14+EGFR, Lipid Panel, rosuvastatin  (CRESTOR ) 5 MG tablet  Acquired hypothyroidism - Plan: CMP14+EGFR, TSH + free T4  History of gastric surgery - Plan: CMP14+EGFR, CBC, VITAMIN D  25 Hydroxy (Vit-D Deficiency, Fractures)  Screening for malignant neoplasm of prostate - Plan: PSA  Bronchospasm - Plan: albuterol  (VENTOLIN  HFA) 108 (90 Base) MCG/ACT inhaler  Gastroesophageal reflux disease with esophagitis without hemorrhage - Plan: famotidine  (PEPCID ) 20 MG tablet, pantoprazole  (PROTONIX ) 40 MG tablet  Degeneration of intervertebral disc of lumbar region with discogenic back pain - Plan: gabapentin  (NEURONTIN ) 300 MG capsule  A1c shows excellent control at 5.9 today.  Not having any hypoglycemic episodes but is having some issues with the sensor sticking onto him so I gave him a couple of samples today.  They are ordering some additional patches to see if they can maybe help these CGM's stay on him better.  We did his diabetic eye exam here in office today.  He will set up regular eye exam with his eye doctor for new glasses urine microalbumin collected today.  Foot exam done.   Continue follow-up with podiatry as directed  Check liver enzymes, fasting lipid.  Statin renewed Clinically euthymic.  Check thyroid  levels  Check vitamin D  level given history of gastric surgery.  B12 and iron  were checked earlier this year and they were normal  Asymptomatic from a prostate standpoint.  PSA ordered today.  Albuterol  renewed for as needed use during the summer months  PPI and Pepcid  renewed for as needed use.  Okay to use gabapentin  but I think it is fine to use as an as needed to minimize sedating medications and risk associated with these meds  Counseled on healthy lifestyle choices, including diet (rich in fruits, vegetables and lean meats and low in salt and simple carbohydrates) and exercise (at least 30 minutes of moderate physical activity daily).  Patient to follow up 6 months, sooner if concerns arise  Delana Manganello M. Jolinda, DO

## 2024-07-12 NOTE — Patient Instructions (Signed)

## 2024-07-13 ENCOUNTER — Ambulatory Visit: Payer: Self-pay | Admitting: Family Medicine

## 2024-07-13 LAB — CBC
Hematocrit: 43.5 % (ref 37.5–51.0)
Hemoglobin: 14 g/dL (ref 13.0–17.7)
MCH: 28.7 pg (ref 26.6–33.0)
MCHC: 32.2 g/dL (ref 31.5–35.7)
MCV: 89 fL (ref 79–97)
Platelets: 308 x10E3/uL (ref 150–450)
RBC: 4.88 x10E6/uL (ref 4.14–5.80)
RDW: 13.3 % (ref 11.6–15.4)
WBC: 6.9 x10E3/uL (ref 3.4–10.8)

## 2024-07-13 LAB — MICROALBUMIN / CREATININE URINE RATIO
Creatinine, Urine: 156.9 mg/dL
Microalb/Creat Ratio: <2 mg/g{creat} (ref 0–29)
Microalbumin, Urine: <3 ug/mL

## 2024-07-13 LAB — CMP14+EGFR
ALT: 33 IU/L (ref 0–44)
AST: 31 IU/L (ref 0–40)
Albumin: 4.2 g/dL (ref 3.8–4.9)
Alkaline Phosphatase: 64 IU/L (ref 44–121)
BUN/Creatinine Ratio: 16 (ref 9–20)
BUN: 18 mg/dL (ref 6–24)
Bilirubin Total: 0.4 mg/dL (ref 0.0–1.2)
CO2: 22 mmol/L (ref 20–29)
Calcium: 9.3 mg/dL (ref 8.7–10.2)
Chloride: 103 mmol/L (ref 96–106)
Creatinine, Ser: 1.1 mg/dL (ref 0.76–1.27)
Globulin, Total: 2.1 g/dL (ref 1.5–4.5)
Glucose: 100 mg/dL — ABNORMAL HIGH (ref 70–99)
Potassium: 4.5 mmol/L (ref 3.5–5.2)
Sodium: 140 mmol/L (ref 134–144)
Total Protein: 6.3 g/dL (ref 6.0–8.5)
eGFR: 80 mL/min/1.73 (ref >59)

## 2024-07-13 LAB — LIPID PANEL
Chol/HDL Ratio: 2.3 ratio (ref 0.0–5.0)
Cholesterol, Total: 95 mg/dL — ABNORMAL LOW (ref 100–199)
HDL: 41 mg/dL (ref >39)
LDL Chol Calc (NIH): 38 mg/dL (ref 0–99)
Triglycerides: 79 mg/dL (ref 0–149)
VLDL Cholesterol Cal: 16 mg/dL (ref 5–40)

## 2024-07-13 LAB — PSA: Prostate Specific Ag, Serum: 0.1 ng/mL (ref 0.0–4.0)

## 2024-07-13 LAB — TSH+FREE T4
Free T4: 1.39 ng/dL (ref 0.82–1.77)
TSH: 2.2 u[IU]/mL (ref 0.450–4.500)

## 2024-07-13 LAB — VITAMIN D 25 HYDROXY (VIT D DEFICIENCY, FRACTURES): Vit D, 25-Hydroxy: 42 ng/mL (ref 30.0–100.0)

## 2024-07-30 ENCOUNTER — Ambulatory Visit: Payer: Self-pay | Admitting: Family Medicine

## 2024-09-08 ENCOUNTER — Emergency Department (HOSPITAL_BASED_OUTPATIENT_CLINIC_OR_DEPARTMENT_OTHER): Admitting: Radiology

## 2024-09-08 ENCOUNTER — Encounter (HOSPITAL_BASED_OUTPATIENT_CLINIC_OR_DEPARTMENT_OTHER): Payer: Self-pay

## 2024-09-08 ENCOUNTER — Emergency Department (HOSPITAL_BASED_OUTPATIENT_CLINIC_OR_DEPARTMENT_OTHER)
Admission: EM | Admit: 2024-09-08 | Discharge: 2024-09-08 | Disposition: A | Discharge status: Home / Self Care | Attending: Emergency Medicine | Admitting: Emergency Medicine

## 2024-09-08 ENCOUNTER — Other Ambulatory Visit: Payer: Self-pay

## 2024-09-08 DIAGNOSIS — M25551 Pain in right hip: Secondary | ICD-10-CM | POA: Diagnosis not present

## 2024-09-08 DIAGNOSIS — M1610 Unilateral primary osteoarthritis, unspecified hip: Secondary | ICD-10-CM | POA: Diagnosis not present

## 2024-09-08 DIAGNOSIS — R0789 Other chest pain: Secondary | ICD-10-CM | POA: Diagnosis not present

## 2024-09-08 DIAGNOSIS — Z794 Long term (current) use of insulin: Secondary | ICD-10-CM | POA: Diagnosis not present

## 2024-09-08 DIAGNOSIS — S79911A Unspecified injury of right hip, initial encounter: Secondary | ICD-10-CM | POA: Diagnosis not present

## 2024-09-08 DIAGNOSIS — S299XXA Unspecified injury of thorax, initial encounter: Secondary | ICD-10-CM | POA: Diagnosis not present

## 2024-09-08 DIAGNOSIS — R0781 Pleurodynia: Secondary | ICD-10-CM | POA: Diagnosis not present

## 2024-09-08 DIAGNOSIS — W1830XA Fall on same level, unspecified, initial encounter: Secondary | ICD-10-CM | POA: Diagnosis not present

## 2024-09-08 DIAGNOSIS — W19XXXA Unspecified fall, initial encounter: Secondary | ICD-10-CM

## 2024-09-08 MED ORDER — HYDROXYZINE HCL 25 MG PO TABS: 25.0000 mg | ORAL_TABLET | Freq: Four times a day (QID) | ORAL | 0 refills | Status: AC

## 2024-09-08 MED ORDER — OXYCODONE-ACETAMINOPHEN 5-325 MG PO TABS: 1.0000 | ORAL_TABLET | Freq: Four times a day (QID) | ORAL | 0 refills | Status: AC | PRN

## 2024-09-08 MED ORDER — HYDROXYZINE HCL 25 MG PO TABS
25.0000 mg | ORAL_TABLET | Freq: Once | ORAL | Status: AC
  Administered 2024-09-08: 25 mg via ORAL
  Filled 2024-09-08: qty 1

## 2024-09-08 MED ORDER — OXYCODONE-ACETAMINOPHEN 5-325 MG PO TABS
1.0000 | ORAL_TABLET | Freq: Once | ORAL | Status: AC
  Administered 2024-09-08: 1 via ORAL
  Filled 2024-09-08: qty 1
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
## 2024-09-08 NOTE — ED Provider Notes (Signed)
 [place] EMERGENCY DEPARTMENT AT [facility] Provider Note   CSN: [id number] Arrival date & time: 09/08/24  1907     Patient presents with: Marvin Espinoza   Croix Presley is a 53 y.o. male.   Patient with history of diabetes presents today with complaints of fall.  Reports that same occurred around 6 PM this evening when he was walking on a wet floor and slipped and hit the right side of his chest and his right hip on a concrete wall.  He did not hit his head or lose consciousness.  He is not anticoagulated.  He has been able to walk since with some discomfort.  Denies any other injuries or complaints. Took Zanaflex  with minimal improvement.   The history is provided by the patient. No language interpreter was used.  Fall       Prior to Admission medications   Medication Sig Start Date End Date Taking? Authorizing Provider  albuterol  (VENTOLIN  HFA) 108 (90 Base) MCG/ACT inhaler Inhale 2 puffs into the lungs every 6 (six) hours as needed for wheezing or shortness of breath. 07/12/24   Jolinda Potter M, DO  ALPRAZolam  (XANAX ) 1 MG tablet 1 mg. Six times per day prn 01/31/20   [provider]  Blood Glucose Monitoring Suppl (ONETOUCH VERIO) w/Device KIT Use to test blood sugar daily as directed 06/26/20   Jolinda Potter HERO, DO  calcium  carbonate (OS-CAL) 1250 (500 Ca) MG chewable tablet Chew 1 tablet by mouth daily. 05/27/16   [provider]  Calcium -Vitamin D -Vitamin K (SM CALCIUM  SOFT CHEWS PO) Take 1 application by mouth 2 (two) times daily. Chew calcium  tabs twice a day.  (bariatric surgery)    [provider]  colchicine  0.6 MG tablet Take 1.2 mg then one hour late take 0.6 mg . Max 1.8 mg/day 04/27/24   Lavell Lye A, FNP  Continuous Glucose Sensor (DEXCOM G7 SENSOR) MISC Check sugar continuously. Change every 10 days. E11.9 07/12/24   Jolinda Potter HERO, DO  docusate sodium (COLACE) 100 MG capsule Take 100 mg by mouth 2 (two) times daily.     [provider]  famotidine  (PEPCID ) 20 MG tablet Take 1 tablet (20 mg total) by mouth 2 (two) times daily as needed for heartburn or indigestion. 07/12/24   Jolinda Potter HERO, DO  Ferrous Sulfate 50 MG TBCR Take by mouth. 05/27/16   [provider]  gabapentin  (NEURONTIN ) 300 MG capsule TAKE 1 CAPSULE BY MOUTH THREE TIMES A DAY 07/12/24   Jolinda Potter M, DO  glucose blood test strip test blood sugar 2x daily Dx E11.9 03/08/22   Jolinda Potter M, DO  hydrOXYzine  (VISTARIL ) 25 MG capsule Take 25 mg by mouth 3 (three) times daily. 09/16/22   [provider]  Insulin Glargine-Lixisenatide (SOLIQUA ) 100-33 UNT-MCG/ML SOPN Inject 40 Units into the skin daily. 07/12/24   Jolinda Potter HERO, DO  lamoTRIgine  (LAMICTAL ) 200 MG tablet Take 400 mg by mouth at bedtime.     [provider]  levothyroxine  (SYNTHROID ) 125 MCG tablet Take 125 mcg by mouth daily before breakfast.    [provider]  Magnesium  Gluconate (MAGNESIUM  27 PO) Take by mouth.    [provider]  meclizine  (ANTIVERT ) 25 MG tablet TAKE 1 TABLET BY MOUTH 3 TIMES DAILY AS NEEDED FOR DIZZINESS 11/11/23   Jolinda Potter M, DO  metFORMIN  (GLUCOPHAGE ) 1000 MG tablet Take 1 tablet (1,000 mg total) by mouth 2 (two) times daily with a meal. 07/12/24   Jolinda Potter  M, DO  Multiple Vitamin (MULTIVITAMIN WITH MINERALS) TABS tablet Take 1 tablet by mouth 2 (two) times daily. For Vitamin supplementation Patient taking differently: Take 1 tablet by mouth 2 (two) times daily. For Vitamin supplementation FROM GASTRIC BYPASS 05/15/17   Collene Gouge I, NP  ondansetron  (ZOFRAN -ODT) 4 MG disintegrating tablet Take 1-2 tablets (4-8 mg total) by mouth every 8 (eight) hours as needed for nausea or vomiting. 09/03/23   Milian, Marry Lenis, FNP  OneTouch Delica Lancets 30G MISC Use to test blood sugar daily as directed 06/26/20   Jolinda Norene HERO, DO  pantoprazole  (PROTONIX ) 40 MG tablet Take 1  tablet (40 mg total) by mouth 2 (two) times daily. 07/12/24   Jolinda Norene HERO, DO  promethazine  (PHENERGAN ) 12.5 MG tablet TAKE 1 TO 2 TABLETS EVERY 8 HOURS AS NEEDED FOR NAUSEA AND VOMITING. ONLY USE IF ABSOLUTELY NEEDED. Further fills PER GI! 12/12/23   Jolinda Norene M, DO  psyllium (METAMUCIL) 58.6 % powder Take 1 packet by mouth 2 (two) times daily.    [provider]  rosuvastatin  (CRESTOR ) 5 MG tablet Take 1 tablet (5 mg total) by mouth daily. 07/12/24   Jolinda Norene HERO, DO  vitamin B-12 (CYANOCOBALAMIN ) 250 MCG tablet Take 1 tablet (250 mcg total) by mouth daily. For low B-12 supplementation 01/29/20   Collene Gouge I, NP    Allergies: Abilify [aripiprazole], Caplyta [lumateperone], Pregabalin, Seroquel [quetiapine fumarate], Alcohol, Ambien  [zolpidem ], Anacin-3 [acetaminophen ], Cariprazine, Cariprazine hcl, Latuda [lurasidone hcl], Lurasidone, Prozac  [fluoxetine ], Victoza [liraglutide], Hydrocodone , Tizanidine , Bupropion, Hydrocodone -acetaminophen , Risperidone, and Tegretol  [carbamazepine ]    Review of Systems  Musculoskeletal:  Positive for arthralgias and myalgias.  All other systems reviewed and are negative.   Updated Vital Signs BP 104/68 (BP Location: Left Arm)   Pulse 83   Temp 98.7 F (37.1 C) (Oral)   Resp (!) 24   Ht 5' 10 (1.778 m)   Wt 105.7 kg   SpO2 96%   BMI 33.43 kg/m   Physical Exam Vitals and nursing note reviewed.  Constitutional:      General: He is not in acute distress.    Appearance: Normal appearance. He is normal weight. He is not ill-appearing, toxic-appearing or diaphoretic.  HENT:     Head: Normocephalic and atraumatic.     Comments: No racoon eyes No battle sign Eyes:     Extraocular Movements: Extraocular movements intact.     Pupils: Pupils are equal, round, and reactive to light.  Cardiovascular:     Rate and Rhythm: Normal rate.     Comments: Tenderness noted to palpation of the right anterior chest wall. No crepitus,  bruising, or deformity. Pulmonary:     Effort: Pulmonary effort is normal. No respiratory distress.     Breath sounds: Normal breath sounds.  Abdominal:     General: Abdomen is flat.     Palpations: Abdomen is soft.     Tenderness: There is no abdominal tenderness.     Comments: No abdominal tenderness or bruising  Musculoskeletal:        General: Normal range of motion.     Cervical back: Normal and normal range of motion.     Thoracic back: Normal.     Lumbar back: Normal.     Comments: No midline tenderness, no stepoffs or deformity noted on palpation of cervical, thoracic, and lumbar spine  TTP noted to the right iliac crest. No bruising or deformity. Ambulatory with some discomfort. DP and PT pulses intact and 2+. No  pain to the right knee or ankle  Skin:    General: Skin is warm and dry.  Neurological:     General: No focal deficit present.     Mental Status: He is alert and oriented to person, place, and time.  Psychiatric:        Mood and Affect: Mood normal.        Behavior: Behavior normal.     (all labs ordered are listed, but only abnormal results are displayed) Labs Reviewed - No data to display  EKG: None  Radiology: DG Hip Unilat W or Wo Pelvis 2-3 Views Right Result Date: 09/08/2024 EXAM: 2 or more VIEW(S) XRAY OF THE unilateral HIP 09/08/2024 07:58:00 PM COMPARISON: None available. CLINICAL HISTORY: Fall. Pt fell on wet concrete in building; C/O right rib and rt. Hip pain; States he is having a hard time breathing; Zanaflex  taken PTA FINDINGS: BONES AND JOINTS: Mild degenerative changes of the hip joints characterized by mild joint space narrowing and osteophytosis of the superior acetabulum. SOFT TISSUES: The soft tissues are unremarkable. IMPRESSION: 1. No acute fracture or dislocation. 2. 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 Electronically signed by: Dorethia Molt MD 09/08/2024 08:18 PM EDT RP Workstation: [id number]   DG Ribs Unilateral W/Chest  Right Result Date: 09/08/2024 EXAM: 2 VIEW(S) XRAY OF THE  RIBS AND CHEST 09/08/2024 07:58:00 PM COMPARISON: None available. CLINICAL HISTORY: Pt fell on wet concrete in building; C/O right rib and rt. Hip pain; States he is having a hard time breathing; Zanaflex  taken PTA FINDINGS: BONES: No acute displaced rib fracture. LUNGS AND PLEURA: No consolidation or pulmonary edema. No pleural effusion or pneumothorax. HEART AND MEDIASTINUM: No acute abnormality of the cardiac and mediastinal silhouettes. Surgical clips in right upper quadrant noted. IMPRESSION: 1. No acute rib fracture. Electronically signed by: Dorethia Molt MD 09/08/2024 08:16 PM EDT RP Workstation: [id number]     Procedures   Medications Ordered in the ED  oxyCODONE -acetaminophen  (PERCOCET/ROXICET) 5-325 MG per tablet 1 tablet (1 tablet Oral Given 09/08/24 2045)  hydrOXYzine  (ATARAX ) tablet 25 mg (25 mg Oral Given 09/08/24 2045)                                    Medical Decision Making Amount and/or Complexity of Data Reviewed Radiology: ordered.  Risk Prescription drug management.   Patient presents today with complaints of mechanical fall immediately prior to arrival today.  They are afebrile, nontoxic-appearing, and in no acute distress with reassuring vital signs.  Physical exam reveals Tenderness noted to palpation of the right anterior chest wall. No crepitus, bruising, or deformity. No midline tenderness, no stepoffs or deformity noted on palpation of cervical, thoracic, and lumbar spine  TTP noted to the right iliac crest. No bruising or deformity. Ambulatory with some discomfort. DP and PT pulses intact and 2+. No pain to the right knee or ankle. Patient without signs of serious head, neck, or back injury. No midline spinal tenderness or TTP of the chest or abd. Normal neurological exam. No concern for closed head injury, lung injury, or intraabdominal injury. X-ray imaging obtained of the right ribcage and the right  hip which has resulted and reveals  No acute findings  I have personally reviewed and interpreted this imaging and agree with radiology interpretation.  Radiology without acute abnormality.  Patient is able to ambulate with some pain.  I did discuss that he could have  an underlying rib fracture that did not show up on imaging, shared decision making and patient would not like to proceed with CT imaging for further evaluation at this time which I think is reasonable.  Pt is hemodynamically stable, in NAD.   Pain has been managed & pt has no complaints prior to dc.  Patient counseled on typical course of muscle stiffness and soreness post-fall. Discussed s/s that should cause them to return.  Patient has history of bariatric surgery and therefore cannot take NSAIDs.  Will give prescription for a few doses of Percocet for pain.  He does have a listed allergy of this, however reports that he can take it if he has Atarax  with it.  Prescription given for same of this as well.  PDMP reviewed.  Patient advised not to drive or operate heavy machinery while taking this medication.  I have provided him with an incentive spirometer as well.  Encouraged PCP follow-up for recheck if symptoms are not improved in one week. Evaluation and diagnostic testing in the emergency department does not suggest an emergent condition requiring admission or immediate intervention beyond what has been performed at this time.  Plan for discharge with close PCP follow-up.  Patient is understanding and amenable with plan, educated on red flag symptoms that would prompt immediate return.  Patient discharged in stable condition.  Final diagnoses:  Fall, initial encounter    ED Discharge Orders          Ordered    oxyCODONE -acetaminophen  (PERCOCET/ROXICET) 5-325 MG tablet  Every 6 hours PRN        09/08/24 2105    hydrOXYzine  (ATARAX ) 25 MG tablet  Every 6 hours        09/08/24 2105          An After Visit Summary was printed  and given to the patient.      Nora Lauraine DELENA DEVONNA 09/08/24 2107    Ruthe Cornet, DO 09/08/24 2241

## 2024-09-11 DIAGNOSIS — M25511 Pain in right shoulder: Secondary | ICD-10-CM | POA: Diagnosis not present

## 2024-09-11 DIAGNOSIS — M25551 Pain in right hip: Secondary | ICD-10-CM | POA: Diagnosis not present

## 2024-10-01 ENCOUNTER — Encounter: Payer: Self-pay | Admitting: Family Medicine

## 2024-10-19 ENCOUNTER — Encounter: Payer: Self-pay | Admitting: Podiatry

## 2024-10-19 ENCOUNTER — Ambulatory Visit: Payer: 59 | Admitting: Podiatry

## 2024-10-19 DIAGNOSIS — E119 Type 2 diabetes mellitus without complications: Secondary | ICD-10-CM | POA: Diagnosis not present

## 2024-10-19 DIAGNOSIS — Z0189 Encounter for other specified special examinations: Secondary | ICD-10-CM

## 2024-10-19 DIAGNOSIS — L603 Nail dystrophy: Secondary | ICD-10-CM | POA: Diagnosis not present

## 2024-10-19 NOTE — Progress Notes (Signed)
 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   53 year old male presents with above concerns.  Not been using any medication for his toenails currently.  He presents today for diabetic foot exam.  Does not report any ulcerations or claudication symptoms.  Previously we discussed Lamisil for nail fungus however given elevated liver enzymes which was not ordered.  Last A1c 5.9 on 07/12/2024.   Objective: AAO x3, NAD DP/PT pulses palpable bilaterally, CRT less than 3 seconds Sensation intact with Semmes Weinstein monofilament. Bilateral hallux nails are hypertrophic, dystrophic.  Nails are mildly elongated.  There is no edema, erythema or signs of infection. No area of pinpoint tenderness.  MMT 5/5. No pain with calf compression, swelling, warmth, erythema  Assessment: Onychodystrophy; diabetic foot exam  Plan: -All treatment options discussed with the patient including all alternatives, risks, complications.  -As a courtesy I debrided the nails x 10 without complications or bleeding -Discussed daily foot inspection, good glucose control  Return in about 1 year (around 10/19/2025) for diabetic foot exam.  Donnice JONELLE Fees DPM

## 2024-11-09 ENCOUNTER — Other Ambulatory Visit: Payer: Self-pay | Admitting: Family Medicine

## 2024-11-09 ENCOUNTER — Encounter: Payer: Self-pay | Admitting: Family Medicine

## 2024-11-09 MED ORDER — PROMETHAZINE HCL 12.5 MG PO TABS: ORAL_TABLET | ORAL | 0 refills | Status: DC

## 2024-11-16 ENCOUNTER — Encounter: Payer: Self-pay | Admitting: Family Medicine

## 2024-11-24 ENCOUNTER — Other Ambulatory Visit: Payer: Self-pay | Admitting: Family Medicine

## 2024-11-26 ENCOUNTER — Telehealth: Payer: Self-pay | Admitting: Pharmacist

## 2024-11-26 NOTE — Telephone Encounter (Signed)
 PA SUBMITTED ON COVERMYMEDS A1c CONTROLLED AT 5.9%

## 2024-11-29 DIAGNOSIS — F3111 Bipolar disorder, current episode manic without psychotic features, mild: Secondary | ICD-10-CM | POA: Diagnosis not present

## 2024-11-29 DIAGNOSIS — F410 Panic disorder [episodic paroxysmal anxiety] without agoraphobia: Secondary | ICD-10-CM | POA: Diagnosis not present

## 2024-11-29 DIAGNOSIS — F3131 Bipolar disorder, current episode depressed, mild: Secondary | ICD-10-CM | POA: Diagnosis not present

## 2024-12-29 ENCOUNTER — Other Ambulatory Visit (HOSPITAL_COMMUNITY): Payer: Self-pay

## 2024-12-29 ENCOUNTER — Telehealth: Payer: Self-pay

## 2024-12-29 NOTE — Telephone Encounter (Signed)
 Pharmacy Patient Advocate Encounter   Received notification from [facility] KEY that prior authorization for [facility] G7 SENSOR is required/requested.   Insurance verification completed.   The patient is insured through [employer].   Per test claim: PA required; PA submitted to above mentioned insurance via Latent Key/confirmation #/EOC [id number]. Status is pending

## 2024-12-31 ENCOUNTER — Other Ambulatory Visit: Payer: Self-pay | Admitting: Family Medicine

## 2024-12-31 NOTE — Telephone Encounter (Signed)
 Pharmacy Patient Advocate Encounter  Received notification from [employer] that Prior Authorization for [facility] G7 SENSOR has been APPROVED from 12/30/24 to 12/30/25   PA #/Case ID/Reference #: [id number]

## 2025-01-06 ENCOUNTER — Telehealth: Admitting: Family Medicine

## 2025-01-06 DIAGNOSIS — J069 Acute upper respiratory infection, unspecified: Secondary | ICD-10-CM | POA: Diagnosis not present

## 2025-01-06 NOTE — Patient Instructions (Signed)
 " Marvin Espinoza, thank you for joining Marvin CHRISTELLA Barefoot, Marvin Espinoza for today's virtual visit.  While this provider is not your primary care provider (PCP), if your PCP is located in our provider database this encounter information will be shared with them immediately following your visit.   A [facility] MyChart account gives you access to today's visit and all your visits, tests, and labs performed at [facility]  click here if you don't have a [facility] MyChart account or go to [email or web address]  Consent: (Patient) Marvin Espinoza provided verbal consent for this virtual visit at the beginning of the encounter.  Current Medications:  Current Outpatient Medications:    albuterol  (VENTOLIN  HFA) 108 (90 Base) MCG/ACT inhaler, Inhale 2 puffs into the lungs every 6 (six) hours as needed for wheezing or shortness of breath., Disp: 8 g, Rfl: 0   ALPRAZolam  (XANAX ) 1 MG tablet, 1 mg. Six times per day prn, Disp: , Rfl:    Blood Glucose Monitoring Suppl (ONETOUCH VERIO) w/Device KIT, Use to test blood sugar daily as directed, Disp: 1 kit, Rfl: 0   calcium  carbonate (OS-CAL) 1250 (500 Ca) MG chewable tablet, Chew 1 tablet by mouth daily., Disp: , Rfl:    Calcium -Vitamin D -Vitamin K (SM CALCIUM  SOFT CHEWS PO), Take 1 application by mouth 2 (two) times daily. Chew calcium  tabs twice a day.  (bariatric surgery), Disp: , Rfl:    colchicine  0.6 MG tablet, Take 1.2 mg then one hour late take 0.6 mg . Max 1.8 mg/day, Disp: 21 tablet, Rfl: 2   Continuous Glucose Sensor (DEXCOM G7 SENSOR) MISC, Check sugar continuously. Change every 10 days. E11.9, Disp: 9 each, Rfl: 3   docusate sodium (COLACE) 100 MG capsule, Take 100 mg by mouth 2 (two) times daily., Disp: , Rfl:    famotidine  (PEPCID ) 20 MG tablet, Take 1 tablet (20 mg total) by mouth 2 (two) times daily as needed for heartburn or indigestion., Disp: 180 tablet, Rfl: 3   Ferrous Sulfate 50 MG TBCR, Take by mouth., Disp: , Rfl:    gabapentin   (NEURONTIN ) 300 MG capsule, TAKE 1 CAPSULE BY MOUTH THREE TIMES A DAY, Disp: 270 capsule, Rfl: 3   glucose blood test strip, test blood sugar 2x daily Dx E11.9, Disp: 100 each, Rfl: 3   hydrOXYzine  (ATARAX ) 25 MG tablet, Take 1 tablet (25 mg total) by mouth every 6 (six) hours., Disp: 12 tablet, Rfl: 0   hydrOXYzine  (VISTARIL ) 25 MG capsule, Take 25 mg by mouth 3 (three) times daily., Disp: , Rfl:    Insulin Glargine-Lixisenatide (SOLIQUA ) 100-33 UNT-MCG/ML SOPN, Inject 40 Units into the skin daily., Disp: 45 mL, Rfl: 3   lamoTRIgine  (LAMICTAL ) 200 MG tablet, Take 400 mg by mouth at bedtime. , Disp: , Rfl:    levothyroxine  (SYNTHROID ) 125 MCG tablet, Take 125 mcg by mouth daily before breakfast., Disp: , Rfl:    Magnesium  Gluconate (MAGNESIUM  27 PO), Take by mouth., Disp: , Rfl:    meclizine  (ANTIVERT ) 25 MG tablet, TAKE 1 TABLET BY MOUTH 3 TIMES DAILY AS NEEDED FOR DIZZINESS, Disp: 30 tablet, Rfl: 0   metFORMIN  (GLUCOPHAGE ) 1000 MG tablet, Take 1 tablet (1,000 mg total) by mouth 2 (two) times daily with a meal., Disp: 180 tablet, Rfl: 3   Multiple Vitamin (MULTIVITAMIN WITH MINERALS) TABS tablet, Take 1 tablet by mouth 2 (two) times daily. For Vitamin supplementation (Patient taking differently: Take 1 tablet by mouth 2 (two) times daily. For Vitamin supplementation FROM GASTRIC BYPASS), Disp: ,  Rfl:    ondansetron  (ZOFRAN -ODT) 4 MG disintegrating tablet, Take 1-2 tablets (4-8 mg total) by mouth every 8 (eight) hours as needed for nausea or vomiting., Disp: 30 tablet, Rfl: 0   OneTouch Delica Lancets 30G MISC, Use to test blood sugar daily as directed, Disp: 100 each, Rfl: 3   oxyCODONE -acetaminophen  (PERCOCET/ROXICET) 5-325 MG tablet, Take 1 tablet by mouth every 6 (six) hours as needed for severe pain (pain score 7-10)., Disp: 10 tablet, Rfl: 0   pantoprazole  (PROTONIX ) 40 MG tablet, Take 1 tablet (40 mg total) by mouth 2 (two) times daily., Disp: 180 tablet, Rfl: 3   promethazine  (PHENERGAN ) 12.5  MG tablet, TAKE 1 TO 2 TABLETS EVERY 8 HOURS AS NEEDED FOR NAUSEA AND VOMITING. ONLY USE IF ABSOLUTELY NEEDED. Further fills PER GI!, Disp: 60 tablet, Rfl: 0   psyllium (METAMUCIL) 58.6 % powder, Take 1 packet by mouth 2 (two) times daily., Disp: , Rfl:    rosuvastatin  (CRESTOR ) 5 MG tablet, Take 1 tablet (5 mg total) by mouth daily., Disp: 90 tablet, Rfl: 3   vitamin B-12 (CYANOCOBALAMIN ) 250 MCG tablet, Take 1 tablet (250 mcg total) by mouth daily. For low B-12 supplementation, Disp: 1 tablet, Rfl:    Medications ordered in this encounter:  No orders of the defined types were placed in this encounter.    *If you need refills on other medications prior to your next appointment, please contact your pharmacy*  Follow-Up: Call back or seek an in-person evaluation if the symptoms worsen or if the condition fails to improve as anticipated.  [facility] [phone]  Other Instructions  -Continue to check BS and monitor for changes. -Flonase start -Follow up next week with PCP if not improving - Increased rest - Increasing Fluids - Acetaminophen  for pain - Salt water gargling, chloraseptic spray and throat lozenges - Saline nasal spray if congestion or if nasal passages feel dry. - Humidifying the air.   If you have been instructed to have an in-person evaluation today at a local Urgent Care facility, please use the link below. It will take you to a list of all of our available [facility], including address, phone number and hours of operation. Please do not delay care.  [facility]  If you or a family member do not have a primary care provider, use the link below to schedule a visit and establish care. When you choose a [facility] primary care physician or advanced practice provider, you gain a long-term partner in health. Find a Primary Care Provider  Learn more about [facility] in-office and virtual care options:  - Get Care  Now  "

## 2025-01-06 NOTE — Progress Notes (Signed)
 " Virtual Visit Consent   Shihab States, you are scheduled for a virtual visit with a [facility] provider today. Just as with appointments in the office, your consent must be obtained to participate. Your consent will be active for this visit and any virtual visit you may have with one of our providers in the next 365 days. If you have a MyChart account, a copy of this consent can be sent to you electronically.  As this is a virtual visit, video technology does not allow for your provider to perform a traditional examination. This may limit your provider's ability to fully assess your condition. If your provider identifies any concerns that need to be evaluated in person or the need to arrange testing (such as labs, EKG, etc.), we will make arrangements to do so. Although advances in technology are sophisticated, we cannot ensure that it will always work on either your end or our end. If the connection with a video visit is poor, the visit may have to be switched to a telephone visit. With either a video or telephone visit, we are not always able to ensure that we have a secure connection.  By engaging in this virtual visit, you consent to the provision of healthcare and authorize for your insurance to be billed (if applicable) for the services provided during this visit. Depending on your insurance coverage, you may receive a charge related to this service.  I need to obtain your verbal consent now. Are you willing to proceed with your visit today? Marvin Espinoza has provided verbal consent on 01/06/2025 for a virtual visit (video or telephone). Chiquita CHRISTELLA Barefoot, NP  Date: 01/06/2025 9:33 AM   Virtual Visit via Video Note   I, Chiquita CHRISTELLA Barefoot, connected with  Marvin Espinoza  ([id number], 55-Apr-1972) on 01/06/25 at  9:30 AM EST by a video-enabled telemedicine application and verified that I am speaking with the correct person using two identifiers.  Location: Patient: Virtual Visit Location Patient:  Home Provider: Virtual Visit Location Provider: Home Office   I discussed the limitations of evaluation and management by telemedicine and the availability of in person appointments. The patient expressed understanding and agreed to proceed.    History of Present Illness: Marvin Espinoza is a 54 y.o. who identifies as a male who was assigned male at birth, and is being seen today for sinus congestion  Onset was 2 days ago with congestion/ runny nose, and pressure behind his eyes. Mucus is clear and turning yellow. Associated symptoms are headaches Modifying factors are mucinex and ny quil  Denies chest pain, shortness of breath, fevers, chills  Exposure to sick contacts- wife was sick last week COVID/flu test: none Vaccines: none  Problems:  Patient Active Problem List   Diagnosis Date Noted   Diabetes mellitus type II, controlled (HCC) 08/01/2021   Chronic constipation 12/29/2020   History of gastric surgery 03/21/2020   Neuropathy 01/05/2019   Severe episode of recurrent major depressive disorder, without psychotic features (HCC) 10/04/2018   OSA (obstructive sleep apnea) 09/07/2018   Shoulder pain, bilateral 10/24/2017   Arthritis 10/24/2017   Osteoarthritis of both hips 10/24/2017   Avascular necrosis of bone of hip, right (HCC) 10/24/2017   Osteoarthritis of shoulder 10/24/2017   PTSD (post-traumatic stress disorder) 08/20/2017   Tardive dyskinesia 08/20/2017   Affective psychosis, bipolar (HCC) 05/10/2017   Mood disorder 02/10/2017   Adjustment insomnia 01/24/2017   Adjustment disorder with anxious mood 01/24/2017   GAD (generalized anxiety disorder) 01/24/2017  Gastroesophageal reflux disease without esophagitis 11/19/2016   Hypothyroidism 11/19/2016   DDD (degenerative disc disease), lumbar 11/19/2016    Allergies: Allergies[1] Medications: Current Medications[2]  Observations/Objective: Patient is well-developed, well-nourished in no acute distress.  Resting  comfortably  at home.  Head is normocephalic, atraumatic.  No labored breathing.  Speech is clear and coherent with logical content.  Patient is alert and oriented at baseline.  Congestion  Assessment and Plan:  1. Viral URI (Primary)  URI recommendations: -Continue to check BS and monitor for changes. -Flonase start -Follow up next week with PCP if not improving - Increased rest - Increasing Fluids - Acetaminophen  fever pain.  - Salt water gargling, chloraseptic spray and throat lozenges - Saline nasal spray if congestion or if nasal passages feel dry. - Humidifying the air.     Reviewed side effects, risks and benefits of medication.    Patient acknowledged agreement and understanding of the plan.   Past Medical, Surgical, Social History, Allergies, and Medications have been Reviewed.    Follow Up Instructions: I discussed the assessment and treatment plan with the patient. The patient was provided an opportunity to ask questions and all were answered. The patient agreed with the plan and demonstrated an understanding of the instructions.  A copy of instructions were sent to the patient via MyChart unless otherwise noted below.    The patient was advised to call back or seek an in-person evaluation if the symptoms worsen or if the condition fails to improve as anticipated.    Chiquita CHRISTELLA Barefoot, NP     [1]  Allergies Allergen Reactions   Abilify [Aripiprazole] Anaphylaxis and Swelling    slurred speech FACIAL DROOPING, TONGUE SWELLING, TARDIVE DYSKENESIA    Caplyta [Lumateperone] Anaphylaxis   Pregabalin Shortness Of Breath   Seroquel [Quetiapine Fumarate]     tardive dyskinesia   Alcohol Itching    Drinking alcohol   Ambien  [Zolpidem ] Other (See Comments)    NIGHT TERRORS   Anacin-3 [Acetaminophen ] Itching    REGULAR TYLENOL  IS OKAY.  NEEDS BENADRYL  TO TAKE THIS   Cariprazine Other (See Comments)    Tardive dyskenesia   Cariprazine Hcl Itching   Latuda  [Lurasidone Hcl] Other (See Comments)    RUNS SUGAR TOO HIGH   Lurasidone Other (See Comments)    TARDIVE DYSKINESIA   Prozac  [Fluoxetine ] Itching   Victoza [Liraglutide] Other (See Comments)    Severe heartburn    Hydrocodone  Other (See Comments)   Tizanidine      Elevated LFTs   Bupropion Rash   Hydrocodone -Acetaminophen  Itching   Risperidone Rash   Tegretol  [Carbamazepine ] Rash  [2]  Current Outpatient Medications:    albuterol  (VENTOLIN  HFA) 108 (90 Base) MCG/ACT inhaler, Inhale 2 puffs into the lungs every 6 (six) hours as needed for wheezing or shortness of breath., Disp: 8 g, Rfl: 0   ALPRAZolam  (XANAX ) 1 MG tablet, 1 mg. Six times per day prn, Disp: , Rfl:    Blood Glucose Monitoring Suppl (ONETOUCH VERIO) w/Device KIT, Use to test blood sugar daily as directed, Disp: 1 kit, Rfl: 0   calcium  carbonate (OS-CAL) 1250 (500 Ca) MG chewable tablet, Chew 1 tablet by mouth daily., Disp: , Rfl:    Calcium -Vitamin D -Vitamin K (SM CALCIUM  SOFT CHEWS PO), Take 1 application by mouth 2 (two) times daily. Chew calcium  tabs twice a day.  (bariatric surgery), Disp: , Rfl:    colchicine  0.6 MG tablet, Take 1.2 mg then one hour late take 0.6 mg .  Max 1.8 mg/day, Disp: 21 tablet, Rfl: 2   Continuous Glucose Sensor (DEXCOM G7 SENSOR) MISC, Check sugar continuously. Change every 10 days. E11.9, Disp: 9 each, Rfl: 3   docusate sodium (COLACE) 100 MG capsule, Take 100 mg by mouth 2 (two) times daily., Disp: , Rfl:    famotidine  (PEPCID ) 20 MG tablet, Take 1 tablet (20 mg total) by mouth 2 (two) times daily as needed for heartburn or indigestion., Disp: 180 tablet, Rfl: 3   Ferrous Sulfate 50 MG TBCR, Take by mouth., Disp: , Rfl:    gabapentin  (NEURONTIN ) 300 MG capsule, TAKE 1 CAPSULE BY MOUTH THREE TIMES A DAY, Disp: 270 capsule, Rfl: 3   glucose blood test strip, test blood sugar 2x daily Dx E11.9, Disp: 100 each, Rfl: 3   hydrOXYzine  (ATARAX ) 25 MG tablet, Take 1 tablet (25 mg total) by mouth every  6 (six) hours., Disp: 12 tablet, Rfl: 0   hydrOXYzine  (VISTARIL ) 25 MG capsule, Take 25 mg by mouth 3 (three) times daily., Disp: , Rfl:    Insulin Glargine-Lixisenatide (SOLIQUA ) 100-33 UNT-MCG/ML SOPN, Inject 40 Units into the skin daily., Disp: 45 mL, Rfl: 3   lamoTRIgine  (LAMICTAL ) 200 MG tablet, Take 400 mg by mouth at bedtime. , Disp: , Rfl:    levothyroxine  (SYNTHROID ) 125 MCG tablet, Take 125 mcg by mouth daily before breakfast., Disp: , Rfl:    Magnesium  Gluconate (MAGNESIUM  27 PO), Take by mouth., Disp: , Rfl:    meclizine  (ANTIVERT ) 25 MG tablet, TAKE 1 TABLET BY MOUTH 3 TIMES DAILY AS NEEDED FOR DIZZINESS, Disp: 30 tablet, Rfl: 0   metFORMIN  (GLUCOPHAGE ) 1000 MG tablet, Take 1 tablet (1,000 mg total) by mouth 2 (two) times daily with a meal., Disp: 180 tablet, Rfl: 3   Multiple Vitamin (MULTIVITAMIN WITH MINERALS) TABS tablet, Take 1 tablet by mouth 2 (two) times daily. For Vitamin supplementation (Patient taking differently: Take 1 tablet by mouth 2 (two) times daily. For Vitamin supplementation FROM GASTRIC BYPASS), Disp: , Rfl:    ondansetron  (ZOFRAN -ODT) 4 MG disintegrating tablet, Take 1-2 tablets (4-8 mg total) by mouth every 8 (eight) hours as needed for nausea or vomiting., Disp: 30 tablet, Rfl: 0   OneTouch Delica Lancets 30G MISC, Use to test blood sugar daily as directed, Disp: 100 each, Rfl: 3   oxyCODONE -acetaminophen  (PERCOCET/ROXICET) 5-325 MG tablet, Take 1 tablet by mouth every 6 (six) hours as needed for severe pain (pain score 7-10)., Disp: 10 tablet, Rfl: 0   pantoprazole  (PROTONIX ) 40 MG tablet, Take 1 tablet (40 mg total) by mouth 2 (two) times daily., Disp: 180 tablet, Rfl: 3   promethazine  (PHENERGAN ) 12.5 MG tablet, TAKE 1 TO 2 TABLETS EVERY 8 HOURS AS NEEDED FOR NAUSEA AND VOMITING. ONLY USE IF ABSOLUTELY NEEDED. Further fills PER GI!, Disp: 60 tablet, Rfl: 0   psyllium (METAMUCIL) 58.6 % powder, Take 1 packet by mouth 2 (two) times daily., Disp: , Rfl:     rosuvastatin  (CRESTOR ) 5 MG tablet, Take 1 tablet (5 mg total) by mouth daily., Disp: 90 tablet, Rfl: 3   vitamin B-12 (CYANOCOBALAMIN ) 250 MCG tablet, Take 1 tablet (250 mcg total) by mouth daily. For low B-12 supplementation, Disp: 1 tablet, Rfl:   "

## 2025-01-10 ENCOUNTER — Other Ambulatory Visit: Payer: Self-pay | Admitting: Family Medicine

## 2025-01-12 ENCOUNTER — Encounter: Payer: Self-pay | Admitting: Family Medicine

## 2025-01-12 ENCOUNTER — Other Ambulatory Visit

## 2025-01-12 ENCOUNTER — Ambulatory Visit: Payer: Self-pay | Admitting: Family Medicine

## 2025-01-12 VITALS — BP 121/81 | HR 59 | Temp 97.4°F | Ht 70.0 in | Wt 223.1 lb

## 2025-01-12 DIAGNOSIS — J329 Chronic sinusitis, unspecified: Secondary | ICD-10-CM

## 2025-01-12 DIAGNOSIS — E785 Hyperlipidemia, unspecified: Secondary | ICD-10-CM | POA: Diagnosis not present

## 2025-01-12 DIAGNOSIS — Z9889 Other specified postprocedural states: Secondary | ICD-10-CM | POA: Diagnosis not present

## 2025-01-12 DIAGNOSIS — Z794 Long term (current) use of insulin: Secondary | ICD-10-CM | POA: Diagnosis not present

## 2025-01-12 DIAGNOSIS — E119 Type 2 diabetes mellitus without complications: Secondary | ICD-10-CM

## 2025-01-12 DIAGNOSIS — E1169 Type 2 diabetes mellitus with other specified complication: Secondary | ICD-10-CM

## 2025-01-12 DIAGNOSIS — E039 Hypothyroidism, unspecified: Secondary | ICD-10-CM

## 2025-01-12 LAB — BAYER DCA HB A1C WAIVED: HB A1C (BAYER DCA - WAIVED): 5.3 % (ref 4.8–5.6)

## 2025-01-12 MED ORDER — AMOXICILLIN-POT CLAVULANATE 875-125 MG PO TABS: 1.0000 | ORAL_TABLET | Freq: Two times a day (BID) | ORAL | 0 refills | Status: AC

## 2025-01-12 MED ORDER — FLUTICASONE PROPIONATE 50 MCG/ACT NA SUSP: 2.0000 | Freq: Every day | NASAL | 6 refills | Status: AC

## 2025-01-12 NOTE — Progress Notes (Signed)
 "  Subjective: CC:DM PCP: Jolinda Norene HERO, DO YEP:Marvin Espinoza is a 54 y.o. male presenting to clinic today for:  Type 2 Diabetes with hyperlipidemia:  Patient is accompanied today's visit by his wife.  He reports that he has been doing well.  He is been actively working on weight loss.  He continues to inject 25 units of Soliqua  daily and blood sugars have been running on average around 107.  Utilizing the CGM, Dexcom 7, and reports no hypoglycemia.  Expected A1c is 5.9%.  He is 99% in target range.  To summarize, previously treated with Victoza in the past which caused a lot of GI side effects.  He is also been on Tresiba  and Trulicity  which could never get him to goal.  It is medically necessary that he continue the Soliqua  as this been the only medicine that has kept him in goal and not cause undue side effects   Diabetes Health Maintenance Due  Topic Date Due   HEMOGLOBIN A1C  01/12/2025   OPHTHALMOLOGY EXAM  07/12/2025   FOOT EXAM  10/19/2025   He does report rhinosinusitis that has been ongoing for well over a week to getting a little bit worse.  He reports no fevers, chills, hemoptysis, shortness of breath or wheezing.  Not utilizing anything for symptoms  ROS: Per HPI  Allergies[1] Past Medical History:  Diagnosis Date   Anxiety    Bipolar disorder (HCC)    Concussion without loss of consciousness 10/22/2023   CTS (carpal tunnel syndrome)    Depression    Diabetes mellitus without complication (HCC)    Drug overdose 01/25/2020   GERD (gastroesophageal reflux disease)    H/O bariatric surgery 2016   Head injury 05/06/2017   History of concussion 02/10/2017   Hypothyroidism    Hypothyroidism    Overdose of benzodiazepine, intentional self-harm, initial encounter (HCC) 01/26/2020   Sleep apnea    no longer with OSA d/t over 100 pd weight loss   Current Medications[2] Social History   Socioeconomic History   Marital status: Married    Spouse name: Rosalio Catterton    Number of children: 1   Years of education: 12   Highest education level: 12th grade  Occupational History   Not on file  Tobacco Use   Smoking status: Never    Passive exposure: Never   Smokeless tobacco: Never  Vaping Use   Vaping status: Never Used  Substance and Sexual Activity   Alcohol use: No    Comment: severe allergy to alcohol!!   Drug use: No   Sexual activity: Yes    Partners: Female  Other Topics Concern   Not on file  Social History Narrative   Lives with Wife, and one child   Two story   He stays there to keep an eye on the house.    His son comes to visit also.   Right handed.   In between jobs as a education administrator. 3          Social Drivers of Health   Tobacco Use: Low Risk (01/06/2025)   Patient History    Smoking Tobacco Use: Never    Smokeless Tobacco Use: Never    Passive Exposure: Never  Financial Resource Strain: Medium Risk (01/11/2025)   Overall Financial Resource Strain (CARDIA)    Difficulty of Paying Living Expenses: Somewhat hard  Food Insecurity: No Food Insecurity (01/11/2025)   Epic    Worried About Radiation Protection Practitioner of Food in the Last Year:  Never true    Ran Out of Food in the Last Year: Never true  Transportation Needs: No Transportation Needs (01/11/2025)   Epic    Lack of Transportation (Medical): No    Lack of Transportation (Non-Medical): No  Physical Activity: Sufficiently Active (01/11/2025)   Exercise Vital Sign    Days of Exercise per Week: 2 days    Minutes of Exercise per Session: 90 min  Stress: Stress Concern Present (01/11/2025)   [employer] of Occupational Health - Occupational Stress Questionnaire    Feeling of Stress: To some extent  Social Connections: Moderately Isolated (01/11/2025)   Social Connection and Isolation Panel    Frequency of Communication with Friends and Family: Twice a week    Frequency of Social Gatherings with Friends and Family: Once a week    Attends Religious Services: Never    Doctor, General Practice or Organizations: No    Attends Engineer, Structural: Not on file    Marital Status: Married  Catering Manager Violence: Not At Risk (12/25/2023)   Humiliation, Afraid, Rape, and Kick questionnaire    Fear of Current or Ex-Partner: No    Emotionally Abused: No    Physically Abused: No    Sexually Abused: No  Depression (PHQ2-9): High Risk (07/08/2024)   Depression (PHQ2-9)    PHQ-2 Score: 19  Alcohol Screen: Low Risk (12/25/2023)   Alcohol Screen    Last Alcohol Screening Score (AUDIT): 0  Housing: Unknown (01/11/2025)   Epic    Unable to Pay for Housing in the Last Year: No    Number of Times Moved in the Last Year: Not on file    Homeless in the Last Year: No  Utilities: Not At Risk (12/25/2023)   AHC Utilities    Threatened with loss of utilities: No  Health Literacy: Adequate Health Literacy (12/25/2023)   B1300 Health Literacy    Frequency of need for help with medical instructions: Never   Family History  Problem Relation Age of Onset   Diabetes Mother    Heart disease Mother    Depression Mother    Esophageal cancer Mother    COPD Father    Heart disease Father    Kidney disease Father    Mental illness Father    Depression Father    Mental illness Sister    Depression Sister    Depression Son    Diabetes Mellitus II Son    Hyperlipidemia Son    Esophageal cancer Maternal Uncle    Colon cancer Neg Hx    Rectal cancer Neg Hx    Stomach cancer Neg Hx     Objective: Office vital signs reviewed. BP 121/81   Pulse (!) 59   Temp (!) 97.4 F (36.3 C)   Ht 5' 10 (1.778 m)   Wt 223 lb 2 oz (101.2 kg)   SpO2 98%   BMI 32.02 kg/m   Physical Examination:  General: Awake, alert, well nourished, No acute distress HEENT: sclera white, MMM.  No exophthalmos Cardio: regular rate and rhythm, S1S2 heard, no murmurs appreciated Pulm: clear to auscultation bilaterally, no wheezes, rhonchi or rales; normal work of breathing on room air  Lab Results   Component Value Date   HGBA1C 5.9 (H) 07/12/2024    Assessment/ Plan: 54 y.o. male   Insulin-requiring or dependent type II diabetes mellitus (HCC) - Plan: Microalbumin / creatinine urine ratio, Bayer DCA Hb A1c Waived, Basic Metabolic Panel, Basic Metabolic Panel, Bayer DCA  Hb A1c Waived, Microalbumin / creatinine urine ratio, Bayer DCA Hb A1c Waived  Hyperlipidemia associated with type 2 diabetes mellitus (HCC) - Plan: Basic Metabolic Panel, Basic Metabolic Panel  Acquired hypothyroidism - Plan: Basic Metabolic Panel, TSH + free T4, TSH + free T4, Basic Metabolic Panel  History of gastric surgery - Plan: Iron , TIBC and Ferritin Panel, Vitamin B12, Vitamin B12, Iron , TIBC and Ferritin Panel  Rhinosinusitis - Plan: fluticasone  (FLONASE ) 50 MCG/ACT nasal spray, amoxicillin -clavulanate (AUGMENTIN ) 875-125 MG tablet  Urine microalbumin, labs ordered.  Check thyroid  levels though clinically euthyroid.  Will check iron  and B12 levels given history of gastric surgery as well.  Would like him to continue the Soliqua  as he has is medically complex and this has been very successful in keeping him at target sugar levels without much variance.  He has tried and failed/had intolerance to Tresiba , Trulicity , Victoza.  For his rhinosinusitis, Augmentin  sent.  Encouraged use of Flonase .  Home care instructions reviewed and reasons for reevaluation discussed.  He may follow-up in 6 months, sooner if concerns arise   Camil Hausmann CHRISTELLA Fielding, DO [facility] [phone]     [1]  Allergies Allergen Reactions   Abilify [Aripiprazole] Anaphylaxis and Swelling    slurred speech FACIAL DROOPING, TONGUE SWELLING, TARDIVE DYSKENESIA    Caplyta [Lumateperone] Anaphylaxis   Pregabalin Shortness Of Breath   Seroquel [Quetiapine Fumarate]     tardive dyskinesia   Alcohol Itching    Drinking alcohol   Ambien  [Zolpidem ] Other (See Comments)    NIGHT TERRORS   Anacin-3  [Acetaminophen ] Itching    REGULAR TYLENOL  IS OKAY.  NEEDS BENADRYL  TO TAKE THIS   Cariprazine Other (See Comments)    Tardive dyskenesia   Cariprazine Hcl Itching   Latuda [Lurasidone Hcl] Other (See Comments)    RUNS SUGAR TOO HIGH   Lurasidone Other (See Comments)    TARDIVE DYSKINESIA   Prozac  [Fluoxetine ] Itching   Victoza [Liraglutide] Other (See Comments)    Severe heartburn    Hydrocodone  Other (See Comments)   Tizanidine      Elevated LFTs   Bupropion Rash   Hydrocodone -Acetaminophen  Itching   Risperidone Rash   Tegretol  [Carbamazepine ] Rash  [2]  Current Outpatient Medications:    albuterol  (VENTOLIN  HFA) 108 (90 Base) MCG/ACT inhaler, Inhale 2 puffs into the lungs every 6 (six) hours as needed for wheezing or shortness of breath., Disp: 8 g, Rfl: 0   ALPRAZolam  (XANAX ) 1 MG tablet, 1 mg. Six times per day prn, Disp: , Rfl:    Blood Glucose Monitoring Suppl (ONETOUCH VERIO) w/Device KIT, Use to test blood sugar daily as directed, Disp: 1 kit, Rfl: 0   calcium  carbonate (OS-CAL) 1250 (500 Ca) MG chewable tablet, Chew 1 tablet by mouth daily., Disp: , Rfl:    Calcium -Vitamin D -Vitamin K (SM CALCIUM  SOFT CHEWS PO), Take 1 application by mouth 2 (two) times daily. Chew calcium  tabs twice a day.  (bariatric surgery), Disp: , Rfl:    colchicine  0.6 MG tablet, Take 1.2 mg then one hour late take 0.6 mg . Max 1.8 mg/day, Disp: 21 tablet, Rfl: 2   Continuous Glucose Sensor (DEXCOM G7 SENSOR) MISC, Check sugar continuously. Change every 10 days. E11.9, Disp: 9 each, Rfl: 3   docusate sodium (COLACE) 100 MG capsule, Take 100 mg by mouth 2 (two) times daily., Disp: , Rfl:    famotidine  (PEPCID ) 20 MG tablet, Take 1 tablet (20 mg total) by mouth 2 (two) times daily as  needed for heartburn or indigestion., Disp: 180 tablet, Rfl: 3   Ferrous Sulfate 50 MG TBCR, Take by mouth., Disp: , Rfl:    gabapentin  (NEURONTIN ) 300 MG capsule, TAKE 1 CAPSULE BY MOUTH THREE TIMES A DAY, Disp: 270  capsule, Rfl: 3   glucose blood test strip, test blood sugar 2x daily Dx E11.9, Disp: 100 each, Rfl: 3   hydrOXYzine  (ATARAX ) 25 MG tablet, Take 1 tablet (25 mg total) by mouth every 6 (six) hours., Disp: 12 tablet, Rfl: 0   hydrOXYzine  (VISTARIL ) 25 MG capsule, Take 25 mg by mouth 3 (three) times daily., Disp: , Rfl:    Insulin Glargine-Lixisenatide (SOLIQUA ) 100-33 UNT-MCG/ML SOPN, Inject 40 Units into the skin daily., Disp: 45 mL, Rfl: 3   lamoTRIgine  (LAMICTAL ) 200 MG tablet, Take 400 mg by mouth at bedtime. , Disp: , Rfl:    levothyroxine  (SYNTHROID ) 125 MCG tablet, Take 125 mcg by mouth daily before breakfast., Disp: , Rfl:    Magnesium  Gluconate (MAGNESIUM  27 PO), Take by mouth., Disp: , Rfl:    meclizine  (ANTIVERT ) 25 MG tablet, TAKE 1 TABLET BY MOUTH 3 TIMES DAILY AS NEEDED FOR DIZZINESS, Disp: 30 tablet, Rfl: 0   metFORMIN  (GLUCOPHAGE ) 1000 MG tablet, Take 1 tablet (1,000 mg total) by mouth 2 (two) times daily with a meal., Disp: 180 tablet, Rfl: 3   Multiple Vitamin (MULTIVITAMIN WITH MINERALS) TABS tablet, Take 1 tablet by mouth 2 (two) times daily. For Vitamin supplementation (Patient taking differently: Take 1 tablet by mouth 2 (two) times daily. For Vitamin supplementation FROM GASTRIC BYPASS), Disp: , Rfl:    ondansetron  (ZOFRAN -ODT) 4 MG disintegrating tablet, Take 1-2 tablets (4-8 mg total) by mouth every 8 (eight) hours as needed for nausea or vomiting., Disp: 30 tablet, Rfl: 0   OneTouch Delica Lancets 30G MISC, Use to test blood sugar daily as directed, Disp: 100 each, Rfl: 3   oxyCODONE -acetaminophen  (PERCOCET/ROXICET) 5-325 MG tablet, Take 1 tablet by mouth every 6 (six) hours as needed for severe pain (pain score 7-10)., Disp: 10 tablet, Rfl: 0   pantoprazole  (PROTONIX ) 40 MG tablet, Take 1 tablet (40 mg total) by mouth 2 (two) times daily., Disp: 180 tablet, Rfl: 3   promethazine  (PHENERGAN ) 12.5 MG tablet, TAKE 1-2 TABLETS BY MOUTH EVERY 8 HOURS AS NEEDED FOR NAUSEA &  VOMITING, Disp: 60 tablet, Rfl: 0   psyllium (METAMUCIL) 58.6 % powder, Take 1 packet by mouth 2 (two) times daily., Disp: , Rfl:    rosuvastatin  (CRESTOR ) 5 MG tablet, Take 1 tablet (5 mg total) by mouth daily., Disp: 90 tablet, Rfl: 3   vitamin B-12 (CYANOCOBALAMIN ) 250 MCG tablet, Take 1 tablet (250 mcg total) by mouth daily. For low B-12 supplementation, Disp: 1 tablet, Rfl:   "

## 2025-01-13 ENCOUNTER — Ambulatory Visit: Payer: Self-pay | Admitting: Family Medicine

## 2025-01-13 LAB — BASIC METABOLIC PANEL WITH GFR
BUN/Creatinine Ratio: 13 (ref 9–20)
BUN: 12 mg/dL (ref 6–24)
CO2: 22 mmol/L (ref 20–29)
Calcium: 8.5 mg/dL — ABNORMAL LOW (ref 8.7–10.2)
Chloride: 100 mmol/L (ref 96–106)
Creatinine, Ser: 0.94 mg/dL (ref 0.76–1.27)
Glucose: 157 mg/dL — ABNORMAL HIGH (ref 70–99)
Potassium: 4.3 mmol/L (ref 3.5–5.2)
Sodium: 141 mmol/L (ref 134–144)
eGFR: 96 mL/min/{1.73_m2}

## 2025-01-13 LAB — TSH+FREE T4
Free T4: 1.34 ng/dL (ref 0.82–1.77)
TSH: 0.94 u[IU]/mL (ref 0.450–4.500)

## 2025-01-13 LAB — MICROALBUMIN / CREATININE URINE RATIO
Creatinine, Urine: 22 mg/dL
Microalb/Creat Ratio: <14 mg/g{creat} (ref 0–29)
Microalbumin, Urine: <3 ug/mL

## 2025-01-13 LAB — IRON,TIBC AND FERRITIN PANEL
Ferritin: 119 ng/mL (ref 30–400)
Iron Saturation: 35 % (ref 15–55)
Iron: 109 ug/dL (ref 38–169)
Total Iron Binding Capacity: 309 ug/dL (ref 250–450)
UIBC: 200 ug/dL (ref 111–343)

## 2025-01-13 LAB — VITAMIN B12: Vitamin B-12: >2000 pg/mL — ABNORMAL HIGH (ref 232–1245)

## 2025-01-13 NOTE — Telephone Encounter (Unsigned)
 Copied from CRM #[id number]. Topic: General - Other >> Jan 13, 2025 12:50 PM Zebedee SAUNDERS wrote: Reason for CRM: Pt returning [facility], Arnulfo MATSU, LPN call regarding lab results which were provided. Would like someone to call [phone] pt back to discuss Calcium  and B12.

## 2025-01-13 NOTE — Progress Notes (Signed)
 PT R/C

## 2025-07-29 ENCOUNTER — Ambulatory Visit

## 2025-10-20 ENCOUNTER — Ambulatory Visit: Admitting: Podiatry
